# Patient Record
Sex: Male | Born: 1940
Health system: Southern US, Community
[De-identification: ages and names within clinical notes are randomized; demographics above are authoritative.]

## PROBLEM LIST (undated history)

## (undated) DIAGNOSIS — E785 Hyperlipidemia, unspecified: Secondary | ICD-10-CM

## (undated) DIAGNOSIS — Z5189 Encounter for other specified aftercare: Secondary | ICD-10-CM

## (undated) DIAGNOSIS — S381XXA Crushing injury of abdomen, lower back, and pelvis, initial encounter: Secondary | ICD-10-CM

## (undated) DIAGNOSIS — M1990 Unspecified osteoarthritis, unspecified site: Secondary | ICD-10-CM

## (undated) DIAGNOSIS — E119 Type 2 diabetes mellitus without complications: Secondary | ICD-10-CM

## (undated) DIAGNOSIS — H269 Unspecified cataract: Secondary | ICD-10-CM

## (undated) DIAGNOSIS — I10 Essential (primary) hypertension: Secondary | ICD-10-CM

## (undated) HISTORY — PX: TONSILLECTOMY: SUR1361

## (undated) HISTORY — PX: SUPRAPUBIC CATHETER INSERTION: SUR719

## (undated) HISTORY — DX: Encounter for other specified aftercare: Z51.89

## (undated) HISTORY — PX: ULNAR NERVE TRANSPOSITION: SHX2595

## (undated) HISTORY — PX: POLYPECTOMY: SHX149

## (undated) HISTORY — DX: Unspecified cataract: H26.9

## (undated) HISTORY — DX: Unspecified osteoarthritis, unspecified site: M19.90

## (undated) HISTORY — PX: COLONOSCOPY: SHX174

## (undated) HISTORY — PX: ACHILLES TENDON SURGERY: SHX542

## (undated) HISTORY — DX: Crushing injury of abdomen, lower back, and pelvis, initial encounter: S38.1XXA

---

## 2013-06-24 DIAGNOSIS — K635 Polyp of colon: Secondary | ICD-10-CM | POA: Insufficient documentation

## 2014-05-16 DIAGNOSIS — E291 Testicular hypofunction: Secondary | ICD-10-CM | POA: Diagnosis not present

## 2014-05-16 DIAGNOSIS — N529 Male erectile dysfunction, unspecified: Secondary | ICD-10-CM | POA: Diagnosis not present

## 2014-06-01 DIAGNOSIS — Z0000 Encounter for general adult medical examination without abnormal findings: Secondary | ICD-10-CM | POA: Diagnosis not present

## 2014-06-01 DIAGNOSIS — H2513 Age-related nuclear cataract, bilateral: Secondary | ICD-10-CM | POA: Diagnosis not present

## 2014-06-01 DIAGNOSIS — E119 Type 2 diabetes mellitus without complications: Secondary | ICD-10-CM | POA: Diagnosis not present

## 2014-06-13 DIAGNOSIS — E1165 Type 2 diabetes mellitus with hyperglycemia: Secondary | ICD-10-CM | POA: Diagnosis not present

## 2014-06-13 DIAGNOSIS — I10 Essential (primary) hypertension: Secondary | ICD-10-CM | POA: Diagnosis not present

## 2014-06-13 DIAGNOSIS — J309 Allergic rhinitis, unspecified: Secondary | ICD-10-CM | POA: Diagnosis not present

## 2014-06-13 DIAGNOSIS — E118 Type 2 diabetes mellitus with unspecified complications: Secondary | ICD-10-CM | POA: Diagnosis not present

## 2014-06-14 DIAGNOSIS — M5136 Other intervertebral disc degeneration, lumbar region: Secondary | ICD-10-CM | POA: Diagnosis not present

## 2014-06-14 DIAGNOSIS — M545 Low back pain: Secondary | ICD-10-CM | POA: Diagnosis not present

## 2014-06-14 DIAGNOSIS — M4806 Spinal stenosis, lumbar region: Secondary | ICD-10-CM | POA: Diagnosis not present

## 2014-06-14 DIAGNOSIS — M47816 Spondylosis without myelopathy or radiculopathy, lumbar region: Secondary | ICD-10-CM | POA: Diagnosis not present

## 2014-06-27 DIAGNOSIS — Z6829 Body mass index (BMI) 29.0-29.9, adult: Secondary | ICD-10-CM | POA: Diagnosis not present

## 2014-06-27 DIAGNOSIS — Z0000 Encounter for general adult medical examination without abnormal findings: Secondary | ICD-10-CM | POA: Diagnosis not present

## 2014-07-19 DIAGNOSIS — E780 Pure hypercholesterolemia: Secondary | ICD-10-CM | POA: Diagnosis not present

## 2014-07-19 DIAGNOSIS — Z6829 Body mass index (BMI) 29.0-29.9, adult: Secondary | ICD-10-CM | POA: Diagnosis not present

## 2014-07-19 DIAGNOSIS — I10 Essential (primary) hypertension: Secondary | ICD-10-CM | POA: Diagnosis not present

## 2014-07-19 DIAGNOSIS — E1165 Type 2 diabetes mellitus with hyperglycemia: Secondary | ICD-10-CM | POA: Diagnosis not present

## 2014-09-14 DIAGNOSIS — M5136 Other intervertebral disc degeneration, lumbar region: Secondary | ICD-10-CM | POA: Diagnosis not present

## 2014-09-14 DIAGNOSIS — M545 Low back pain: Secondary | ICD-10-CM | POA: Diagnosis not present

## 2014-09-14 DIAGNOSIS — M4806 Spinal stenosis, lumbar region: Secondary | ICD-10-CM | POA: Diagnosis not present

## 2014-09-24 DIAGNOSIS — E780 Pure hypercholesterolemia: Secondary | ICD-10-CM | POA: Diagnosis not present

## 2014-09-24 DIAGNOSIS — I10 Essential (primary) hypertension: Secondary | ICD-10-CM | POA: Diagnosis not present

## 2014-09-24 DIAGNOSIS — E1165 Type 2 diabetes mellitus with hyperglycemia: Secondary | ICD-10-CM | POA: Diagnosis not present

## 2014-09-24 DIAGNOSIS — Z6829 Body mass index (BMI) 29.0-29.9, adult: Secondary | ICD-10-CM | POA: Diagnosis not present

## 2014-11-20 DIAGNOSIS — M4316 Spondylolisthesis, lumbar region: Secondary | ICD-10-CM | POA: Diagnosis not present

## 2014-11-20 DIAGNOSIS — M5126 Other intervertebral disc displacement, lumbar region: Secondary | ICD-10-CM | POA: Diagnosis not present

## 2014-11-20 DIAGNOSIS — M47816 Spondylosis without myelopathy or radiculopathy, lumbar region: Secondary | ICD-10-CM | POA: Diagnosis not present

## 2014-11-20 DIAGNOSIS — M5136 Other intervertebral disc degeneration, lumbar region: Secondary | ICD-10-CM | POA: Diagnosis not present

## 2014-11-20 DIAGNOSIS — M545 Low back pain: Secondary | ICD-10-CM | POA: Diagnosis not present

## 2014-11-20 DIAGNOSIS — M4806 Spinal stenosis, lumbar region: Secondary | ICD-10-CM | POA: Diagnosis not present

## 2014-11-28 DIAGNOSIS — K760 Fatty (change of) liver, not elsewhere classified: Secondary | ICD-10-CM | POA: Diagnosis not present

## 2014-11-28 DIAGNOSIS — N132 Hydronephrosis with renal and ureteral calculous obstruction: Secondary | ICD-10-CM | POA: Diagnosis not present

## 2014-11-28 DIAGNOSIS — I10 Essential (primary) hypertension: Secondary | ICD-10-CM | POA: Diagnosis not present

## 2014-11-28 DIAGNOSIS — N202 Calculus of kidney with calculus of ureter: Secondary | ICD-10-CM | POA: Diagnosis not present

## 2014-11-28 DIAGNOSIS — N1330 Unspecified hydronephrosis: Secondary | ICD-10-CM | POA: Diagnosis not present

## 2014-11-28 DIAGNOSIS — M5127 Other intervertebral disc displacement, lumbosacral region: Secondary | ICD-10-CM | POA: Diagnosis not present

## 2014-11-28 DIAGNOSIS — E119 Type 2 diabetes mellitus without complications: Secondary | ICD-10-CM | POA: Diagnosis not present

## 2014-11-28 DIAGNOSIS — Z6829 Body mass index (BMI) 29.0-29.9, adult: Secondary | ICD-10-CM | POA: Diagnosis not present

## 2014-11-28 DIAGNOSIS — R350 Frequency of micturition: Secondary | ICD-10-CM | POA: Diagnosis not present

## 2014-12-03 DIAGNOSIS — E780 Pure hypercholesterolemia: Secondary | ICD-10-CM | POA: Diagnosis not present

## 2014-12-03 DIAGNOSIS — N200 Calculus of kidney: Secondary | ICD-10-CM | POA: Diagnosis not present

## 2014-12-03 DIAGNOSIS — E1165 Type 2 diabetes mellitus with hyperglycemia: Secondary | ICD-10-CM | POA: Diagnosis not present

## 2014-12-03 DIAGNOSIS — Z6830 Body mass index (BMI) 30.0-30.9, adult: Secondary | ICD-10-CM | POA: Diagnosis not present

## 2014-12-03 DIAGNOSIS — I10 Essential (primary) hypertension: Secondary | ICD-10-CM | POA: Diagnosis not present

## 2015-01-22 DIAGNOSIS — M4806 Spinal stenosis, lumbar region: Secondary | ICD-10-CM | POA: Diagnosis not present

## 2015-01-22 DIAGNOSIS — M545 Low back pain: Secondary | ICD-10-CM | POA: Diagnosis not present

## 2015-01-22 DIAGNOSIS — M47816 Spondylosis without myelopathy or radiculopathy, lumbar region: Secondary | ICD-10-CM | POA: Diagnosis not present

## 2015-02-05 DIAGNOSIS — N200 Calculus of kidney: Secondary | ICD-10-CM | POA: Diagnosis not present

## 2015-02-05 DIAGNOSIS — I10 Essential (primary) hypertension: Secondary | ICD-10-CM | POA: Diagnosis not present

## 2015-02-05 DIAGNOSIS — Z794 Long term (current) use of insulin: Secondary | ICD-10-CM | POA: Diagnosis not present

## 2015-02-05 DIAGNOSIS — Z23 Encounter for immunization: Secondary | ICD-10-CM | POA: Diagnosis not present

## 2015-02-05 DIAGNOSIS — E1165 Type 2 diabetes mellitus with hyperglycemia: Secondary | ICD-10-CM | POA: Diagnosis not present

## 2015-02-05 DIAGNOSIS — E7800 Pure hypercholesterolemia, unspecified: Secondary | ICD-10-CM | POA: Diagnosis not present

## 2015-02-13 DIAGNOSIS — M25519 Pain in unspecified shoulder: Secondary | ICD-10-CM | POA: Diagnosis not present

## 2015-02-13 DIAGNOSIS — M25511 Pain in right shoulder: Secondary | ICD-10-CM | POA: Diagnosis not present

## 2015-03-13 DIAGNOSIS — M5386 Other specified dorsopathies, lumbar region: Secondary | ICD-10-CM | POA: Diagnosis not present

## 2015-03-13 DIAGNOSIS — M5136 Other intervertebral disc degeneration, lumbar region: Secondary | ICD-10-CM | POA: Diagnosis not present

## 2015-03-13 DIAGNOSIS — E1141 Type 2 diabetes mellitus with diabetic mononeuropathy: Secondary | ICD-10-CM | POA: Diagnosis not present

## 2015-03-13 DIAGNOSIS — M545 Low back pain: Secondary | ICD-10-CM | POA: Diagnosis not present

## 2015-03-13 DIAGNOSIS — M5126 Other intervertebral disc displacement, lumbar region: Secondary | ICD-10-CM | POA: Diagnosis not present

## 2015-03-13 DIAGNOSIS — M542 Cervicalgia: Secondary | ICD-10-CM | POA: Diagnosis not present

## 2015-03-13 DIAGNOSIS — M25519 Pain in unspecified shoulder: Secondary | ICD-10-CM | POA: Diagnosis not present

## 2015-03-13 DIAGNOSIS — M5416 Radiculopathy, lumbar region: Secondary | ICD-10-CM | POA: Diagnosis not present

## 2015-03-13 DIAGNOSIS — M19011 Primary osteoarthritis, right shoulder: Secondary | ICD-10-CM | POA: Diagnosis not present

## 2015-04-17 DIAGNOSIS — N200 Calculus of kidney: Secondary | ICD-10-CM | POA: Diagnosis not present

## 2015-04-17 DIAGNOSIS — Z0000 Encounter for general adult medical examination without abnormal findings: Secondary | ICD-10-CM | POA: Diagnosis not present

## 2015-04-17 DIAGNOSIS — R1031 Right lower quadrant pain: Secondary | ICD-10-CM | POA: Diagnosis not present

## 2015-04-17 DIAGNOSIS — N201 Calculus of ureter: Secondary | ICD-10-CM | POA: Diagnosis not present

## 2015-04-17 DIAGNOSIS — N289 Disorder of kidney and ureter, unspecified: Secondary | ICD-10-CM | POA: Diagnosis not present

## 2015-04-17 DIAGNOSIS — R310 Gross hematuria: Secondary | ICD-10-CM | POA: Diagnosis not present

## 2015-04-24 DIAGNOSIS — N201 Calculus of ureter: Secondary | ICD-10-CM | POA: Diagnosis not present

## 2015-04-24 DIAGNOSIS — R310 Gross hematuria: Secondary | ICD-10-CM | POA: Diagnosis not present

## 2015-04-24 DIAGNOSIS — Z0000 Encounter for general adult medical examination without abnormal findings: Secondary | ICD-10-CM | POA: Diagnosis not present

## 2015-04-26 ENCOUNTER — Other Ambulatory Visit: Payer: Self-pay | Admitting: Urology

## 2015-05-01 DIAGNOSIS — N200 Calculus of kidney: Secondary | ICD-10-CM | POA: Diagnosis not present

## 2015-05-01 DIAGNOSIS — Z6828 Body mass index (BMI) 28.0-28.9, adult: Secondary | ICD-10-CM | POA: Diagnosis not present

## 2015-05-01 DIAGNOSIS — E119 Type 2 diabetes mellitus without complications: Secondary | ICD-10-CM | POA: Diagnosis not present

## 2015-05-01 DIAGNOSIS — Z794 Long term (current) use of insulin: Secondary | ICD-10-CM | POA: Diagnosis not present

## 2015-05-01 DIAGNOSIS — E875 Hyperkalemia: Secondary | ICD-10-CM | POA: Diagnosis not present

## 2015-05-01 DIAGNOSIS — R319 Hematuria, unspecified: Secondary | ICD-10-CM | POA: Diagnosis not present

## 2015-05-01 DIAGNOSIS — R000 Tachycardia, unspecified: Secondary | ICD-10-CM | POA: Diagnosis not present

## 2015-05-01 DIAGNOSIS — E1165 Type 2 diabetes mellitus with hyperglycemia: Secondary | ICD-10-CM | POA: Diagnosis not present

## 2015-05-02 ENCOUNTER — Encounter (HOSPITAL_COMMUNITY): Admission: RE | Payer: Self-pay | Source: Ambulatory Visit

## 2015-05-02 ENCOUNTER — Ambulatory Visit (HOSPITAL_COMMUNITY): Admission: RE | Admit: 2015-05-02 | Payer: Self-pay | Source: Ambulatory Visit | Admitting: Urology

## 2015-05-02 SURGERY — LITHOTRIPSY, ESWL
Anesthesia: LOCAL | Laterality: Right

## 2015-05-15 DIAGNOSIS — Z794 Long term (current) use of insulin: Secondary | ICD-10-CM | POA: Diagnosis not present

## 2015-05-15 DIAGNOSIS — E1165 Type 2 diabetes mellitus with hyperglycemia: Secondary | ICD-10-CM | POA: Diagnosis not present

## 2015-06-04 DIAGNOSIS — H2513 Age-related nuclear cataract, bilateral: Secondary | ICD-10-CM | POA: Diagnosis not present

## 2015-06-04 DIAGNOSIS — E119 Type 2 diabetes mellitus without complications: Secondary | ICD-10-CM | POA: Diagnosis not present

## 2015-06-10 DIAGNOSIS — E1169 Type 2 diabetes mellitus with other specified complication: Secondary | ICD-10-CM | POA: Diagnosis not present

## 2015-06-10 DIAGNOSIS — Z87442 Personal history of urinary calculi: Secondary | ICD-10-CM | POA: Diagnosis not present

## 2015-06-10 DIAGNOSIS — Z794 Long term (current) use of insulin: Secondary | ICD-10-CM | POA: Diagnosis not present

## 2015-06-10 DIAGNOSIS — Z0000 Encounter for general adult medical examination without abnormal findings: Secondary | ICD-10-CM | POA: Diagnosis not present

## 2015-06-10 DIAGNOSIS — E1165 Type 2 diabetes mellitus with hyperglycemia: Secondary | ICD-10-CM | POA: Diagnosis not present

## 2015-06-10 DIAGNOSIS — Z6830 Body mass index (BMI) 30.0-30.9, adult: Secondary | ICD-10-CM | POA: Diagnosis not present

## 2015-06-11 DIAGNOSIS — N201 Calculus of ureter: Secondary | ICD-10-CM | POA: Diagnosis not present

## 2015-06-18 DIAGNOSIS — E1165 Type 2 diabetes mellitus with hyperglycemia: Secondary | ICD-10-CM | POA: Diagnosis not present

## 2015-07-16 DIAGNOSIS — E1165 Type 2 diabetes mellitus with hyperglycemia: Secondary | ICD-10-CM | POA: Diagnosis not present

## 2015-07-30 DIAGNOSIS — N202 Calculus of kidney with calculus of ureter: Secondary | ICD-10-CM | POA: Diagnosis not present

## 2015-07-30 DIAGNOSIS — Z0000 Encounter for general adult medical examination without abnormal findings: Secondary | ICD-10-CM | POA: Diagnosis not present

## 2015-08-13 DIAGNOSIS — J0101 Acute recurrent maxillary sinusitis: Secondary | ICD-10-CM | POA: Diagnosis not present

## 2015-08-13 DIAGNOSIS — Z6829 Body mass index (BMI) 29.0-29.9, adult: Secondary | ICD-10-CM | POA: Diagnosis not present

## 2015-08-13 DIAGNOSIS — E1169 Type 2 diabetes mellitus with other specified complication: Secondary | ICD-10-CM | POA: Diagnosis not present

## 2015-08-13 DIAGNOSIS — I10 Essential (primary) hypertension: Secondary | ICD-10-CM | POA: Diagnosis not present

## 2015-08-13 DIAGNOSIS — E7800 Pure hypercholesterolemia, unspecified: Secondary | ICD-10-CM | POA: Diagnosis not present

## 2015-11-26 DIAGNOSIS — E7800 Pure hypercholesterolemia, unspecified: Secondary | ICD-10-CM | POA: Diagnosis not present

## 2015-11-26 DIAGNOSIS — E118 Type 2 diabetes mellitus with unspecified complications: Secondary | ICD-10-CM | POA: Diagnosis not present

## 2015-11-26 DIAGNOSIS — I10 Essential (primary) hypertension: Secondary | ICD-10-CM | POA: Diagnosis not present

## 2015-11-26 DIAGNOSIS — N200 Calculus of kidney: Secondary | ICD-10-CM | POA: Diagnosis not present

## 2015-11-26 DIAGNOSIS — Z794 Long term (current) use of insulin: Secondary | ICD-10-CM | POA: Diagnosis not present

## 2015-11-26 DIAGNOSIS — R319 Hematuria, unspecified: Secondary | ICD-10-CM | POA: Diagnosis not present

## 2015-11-26 DIAGNOSIS — Z6829 Body mass index (BMI) 29.0-29.9, adult: Secondary | ICD-10-CM | POA: Diagnosis not present

## 2015-12-05 DIAGNOSIS — Z794 Long term (current) use of insulin: Secondary | ICD-10-CM | POA: Diagnosis not present

## 2015-12-05 DIAGNOSIS — E1165 Type 2 diabetes mellitus with hyperglycemia: Secondary | ICD-10-CM | POA: Diagnosis not present

## 2015-12-18 DIAGNOSIS — R1032 Left lower quadrant pain: Secondary | ICD-10-CM | POA: Diagnosis not present

## 2015-12-18 DIAGNOSIS — N201 Calculus of ureter: Secondary | ICD-10-CM | POA: Diagnosis not present

## 2015-12-18 DIAGNOSIS — R310 Gross hematuria: Secondary | ICD-10-CM | POA: Diagnosis not present

## 2015-12-18 DIAGNOSIS — N132 Hydronephrosis with renal and ureteral calculous obstruction: Secondary | ICD-10-CM | POA: Diagnosis not present

## 2015-12-26 DIAGNOSIS — N201 Calculus of ureter: Secondary | ICD-10-CM | POA: Diagnosis not present

## 2016-01-10 ENCOUNTER — Encounter (HOSPITAL_COMMUNITY): Payer: Self-pay

## 2016-01-10 ENCOUNTER — Inpatient Hospital Stay (HOSPITAL_COMMUNITY): Payer: Medicare Other

## 2016-01-10 ENCOUNTER — Emergency Department (HOSPITAL_COMMUNITY): Payer: Medicare Other

## 2016-01-10 ENCOUNTER — Inpatient Hospital Stay (HOSPITAL_COMMUNITY)
Admission: EM | Admit: 2016-01-10 | Discharge: 2016-01-17 | DRG: 958 | Disposition: A | Discharge status: Rehabilitation Facility | Payer: Medicare Other | Attending: General Surgery | Admitting: General Surgery

## 2016-01-10 DIAGNOSIS — S32110A Nondisplaced Zone I fracture of sacrum, initial encounter for closed fracture: Secondary | ICD-10-CM | POA: Diagnosis present

## 2016-01-10 DIAGNOSIS — N3289 Other specified disorders of bladder: Secondary | ICD-10-CM | POA: Diagnosis not present

## 2016-01-10 DIAGNOSIS — IMO0002 Reserved for concepts with insufficient information to code with codable children: Secondary | ICD-10-CM

## 2016-01-10 DIAGNOSIS — S329XXD Fracture of unspecified parts of lumbosacral spine and pelvis, subsequent encounter for fracture with routine healing: Secondary | ICD-10-CM | POA: Diagnosis present

## 2016-01-10 DIAGNOSIS — E119 Type 2 diabetes mellitus without complications: Secondary | ICD-10-CM | POA: Diagnosis not present

## 2016-01-10 DIAGNOSIS — M6282 Rhabdomyolysis: Secondary | ICD-10-CM | POA: Diagnosis present

## 2016-01-10 DIAGNOSIS — L89152 Pressure ulcer of sacral region, stage 2: Secondary | ICD-10-CM | POA: Diagnosis not present

## 2016-01-10 DIAGNOSIS — M5126 Other intervertebral disc displacement, lumbar region: Secondary | ICD-10-CM | POA: Diagnosis present

## 2016-01-10 DIAGNOSIS — N368 Other specified disorders of urethra: Secondary | ICD-10-CM | POA: Diagnosis not present

## 2016-01-10 DIAGNOSIS — I959 Hypotension, unspecified: Secondary | ICD-10-CM | POA: Diagnosis not present

## 2016-01-10 DIAGNOSIS — W3089XA Contact with other specified agricultural machinery, initial encounter: Secondary | ICD-10-CM | POA: Diagnosis not present

## 2016-01-10 DIAGNOSIS — S32391A Other fracture of right ilium, initial encounter for closed fracture: Secondary | ICD-10-CM | POA: Diagnosis not present

## 2016-01-10 DIAGNOSIS — K5903 Drug induced constipation: Secondary | ICD-10-CM | POA: Diagnosis not present

## 2016-01-10 DIAGNOSIS — L89159 Pressure ulcer of sacral region, unspecified stage: Secondary | ICD-10-CM | POA: Diagnosis not present

## 2016-01-10 DIAGNOSIS — W309XXS Contact with unspecified agricultural machinery, sequela: Secondary | ICD-10-CM | POA: Diagnosis not present

## 2016-01-10 DIAGNOSIS — W309XXA Contact with unspecified agricultural machinery, initial encounter: Secondary | ICD-10-CM

## 2016-01-10 DIAGNOSIS — M25559 Pain in unspecified hip: Secondary | ICD-10-CM | POA: Diagnosis not present

## 2016-01-10 DIAGNOSIS — K5909 Other constipation: Secondary | ICD-10-CM | POA: Diagnosis present

## 2016-01-10 DIAGNOSIS — E785 Hyperlipidemia, unspecified: Secondary | ICD-10-CM | POA: Diagnosis present

## 2016-01-10 DIAGNOSIS — D62 Acute posthemorrhagic anemia: Secondary | ICD-10-CM | POA: Diagnosis not present

## 2016-01-10 DIAGNOSIS — S79911A Unspecified injury of right hip, initial encounter: Secondary | ICD-10-CM | POA: Diagnosis not present

## 2016-01-10 DIAGNOSIS — S32511A Fracture of superior rim of right pubis, initial encounter for closed fracture: Secondary | ICD-10-CM | POA: Diagnosis not present

## 2016-01-10 DIAGNOSIS — I10 Essential (primary) hypertension: Secondary | ICD-10-CM | POA: Diagnosis not present

## 2016-01-10 DIAGNOSIS — S299XXA Unspecified injury of thorax, initial encounter: Secondary | ICD-10-CM | POA: Diagnosis not present

## 2016-01-10 DIAGNOSIS — G8918 Other acute postprocedural pain: Secondary | ICD-10-CM | POA: Diagnosis not present

## 2016-01-10 DIAGNOSIS — E875 Hyperkalemia: Secondary | ICD-10-CM | POA: Diagnosis not present

## 2016-01-10 DIAGNOSIS — L89154 Pressure ulcer of sacral region, stage 4: Secondary | ICD-10-CM | POA: Diagnosis not present

## 2016-01-10 DIAGNOSIS — S3730XS Unspecified injury of urethra, sequela: Secondary | ICD-10-CM | POA: Diagnosis not present

## 2016-01-10 DIAGNOSIS — N179 Acute kidney failure, unspecified: Secondary | ICD-10-CM | POA: Diagnosis not present

## 2016-01-10 DIAGNOSIS — S3730XA Unspecified injury of urethra, initial encounter: Secondary | ICD-10-CM | POA: Diagnosis present

## 2016-01-10 DIAGNOSIS — S3733XA Laceration of urethra, initial encounter: Secondary | ICD-10-CM | POA: Diagnosis present

## 2016-01-10 DIAGNOSIS — S32810A Multiple fractures of pelvis with stable disruption of pelvic ring, initial encounter for closed fracture: Secondary | ICD-10-CM

## 2016-01-10 DIAGNOSIS — R110 Nausea: Secondary | ICD-10-CM | POA: Diagnosis not present

## 2016-01-10 DIAGNOSIS — S3739XD Other injury of urethra, subsequent encounter: Secondary | ICD-10-CM | POA: Diagnosis not present

## 2016-01-10 DIAGNOSIS — M160 Bilateral primary osteoarthritis of hip: Secondary | ICD-10-CM | POA: Diagnosis not present

## 2016-01-10 DIAGNOSIS — M25551 Pain in right hip: Secondary | ICD-10-CM

## 2016-01-10 DIAGNOSIS — S32810S Multiple fractures of pelvis with stable disruption of pelvic ring, sequela: Secondary | ICD-10-CM | POA: Diagnosis not present

## 2016-01-10 DIAGNOSIS — S3282XS Multiple fractures of pelvis without disruption of pelvic ring, sequela: Secondary | ICD-10-CM | POA: Diagnosis not present

## 2016-01-10 DIAGNOSIS — Z7984 Long term (current) use of oral hypoglycemic drugs: Secondary | ICD-10-CM | POA: Diagnosis not present

## 2016-01-10 DIAGNOSIS — S3993XA Unspecified injury of pelvis, initial encounter: Secondary | ICD-10-CM | POA: Diagnosis not present

## 2016-01-10 DIAGNOSIS — Z043 Encounter for examination and observation following other accident: Secondary | ICD-10-CM | POA: Diagnosis not present

## 2016-01-10 DIAGNOSIS — S32111A Minimally displaced Zone I fracture of sacrum, initial encounter for closed fracture: Secondary | ICD-10-CM | POA: Diagnosis not present

## 2016-01-10 DIAGNOSIS — M79609 Pain in unspecified limb: Secondary | ICD-10-CM | POA: Diagnosis not present

## 2016-01-10 DIAGNOSIS — T148XXA Other injury of unspecified body region, initial encounter: Secondary | ICD-10-CM

## 2016-01-10 DIAGNOSIS — S3282XA Multiple fractures of pelvis without disruption of pelvic ring, initial encounter for closed fracture: Secondary | ICD-10-CM | POA: Diagnosis present

## 2016-01-10 DIAGNOSIS — S381XXA Crushing injury of abdomen, lower back, and pelvis, initial encounter: Secondary | ICD-10-CM | POA: Diagnosis present

## 2016-01-10 DIAGNOSIS — S79811A Other specified injuries of right hip, initial encounter: Secondary | ICD-10-CM

## 2016-01-10 DIAGNOSIS — M533 Sacrococcygeal disorders, not elsewhere classified: Secondary | ICD-10-CM | POA: Diagnosis not present

## 2016-01-10 DIAGNOSIS — W3089XD Contact with other specified agricultural machinery, subsequent encounter: Secondary | ICD-10-CM | POA: Diagnosis not present

## 2016-01-10 DIAGNOSIS — S32811A Multiple fractures of pelvis with unstable disruption of pelvic ring, initial encounter for closed fracture: Secondary | ICD-10-CM | POA: Diagnosis present

## 2016-01-10 DIAGNOSIS — Z23 Encounter for immunization: Secondary | ICD-10-CM | POA: Diagnosis not present

## 2016-01-10 DIAGNOSIS — Z79899 Other long term (current) drug therapy: Secondary | ICD-10-CM | POA: Diagnosis not present

## 2016-01-10 DIAGNOSIS — N5089 Other specified disorders of the male genital organs: Secondary | ICD-10-CM | POA: Diagnosis not present

## 2016-01-10 DIAGNOSIS — R262 Difficulty in walking, not elsewhere classified: Secondary | ICD-10-CM

## 2016-01-10 DIAGNOSIS — S3991XA Unspecified injury of abdomen, initial encounter: Secondary | ICD-10-CM | POA: Diagnosis not present

## 2016-01-10 DIAGNOSIS — S329XXA Fracture of unspecified parts of lumbosacral spine and pelvis, initial encounter for closed fracture: Secondary | ICD-10-CM | POA: Diagnosis not present

## 2016-01-10 DIAGNOSIS — S3982XD Other specified injuries of lower back, subsequent encounter: Secondary | ICD-10-CM | POA: Diagnosis not present

## 2016-01-10 DIAGNOSIS — L89153 Pressure ulcer of sacral region, stage 3: Secondary | ICD-10-CM | POA: Diagnosis not present

## 2016-01-10 DIAGNOSIS — E1142 Type 2 diabetes mellitus with diabetic polyneuropathy: Secondary | ICD-10-CM | POA: Diagnosis not present

## 2016-01-10 HISTORY — DX: Essential (primary) hypertension: I10

## 2016-01-10 HISTORY — DX: Type 2 diabetes mellitus without complications: E11.9

## 2016-01-10 HISTORY — DX: Hyperlipidemia, unspecified: E78.5

## 2016-01-10 LAB — CBC WITH DIFFERENTIAL/PLATELET
Basophils Absolute: 0 K/uL (ref 0.0–0.1)
Basophils Relative: 0 %
Eosinophils Absolute: 0.2 K/uL (ref 0.0–0.7)
Eosinophils Relative: 1 %
HCT: 32.8 % — ABNORMAL LOW (ref 39.0–52.0)
Hemoglobin: 10.8 g/dL — ABNORMAL LOW (ref 13.0–17.0)
Lymphocytes Relative: 15 %
Lymphs Abs: 3.4 K/uL (ref 0.7–4.0)
MCH: 29.9 pg (ref 26.0–34.0)
MCHC: 32.9 g/dL (ref 30.0–36.0)
MCV: 90.9 fL (ref 78.0–100.0)
Monocytes Absolute: 1.4 K/uL — ABNORMAL HIGH (ref 0.1–1.0)
Monocytes Relative: 6 %
Neutro Abs: 17.6 K/uL — ABNORMAL HIGH (ref 1.7–7.7)
Neutrophils Relative %: 78 %
Platelets: 194 K/uL (ref 150–400)
RBC: 3.61 MIL/uL — ABNORMAL LOW (ref 4.22–5.81)
RDW: 13 % (ref 11.5–15.5)
WBC Morphology: INCREASED
WBC: 22.6 K/uL — ABNORMAL HIGH (ref 4.0–10.5)

## 2016-01-10 LAB — I-STAT CHEM 8, ED
BUN: 20 mg/dL (ref 6–20)
BUN: 21 mg/dL — ABNORMAL HIGH (ref 6–20)
CALCIUM ION: 1.07 mmol/L — AB (ref 1.15–1.40)
CHLORIDE: 108 mmol/L (ref 101–111)
CREATININE: 1.3 mg/dL — AB (ref 0.61–1.24)
Calcium, Ion: 1.02 mmol/L — ABNORMAL LOW (ref 1.15–1.40)
Chloride: 109 mmol/L (ref 101–111)
Creatinine, Ser: 1.2 mg/dL (ref 0.61–1.24)
Glucose, Bld: 325 mg/dL — ABNORMAL HIGH (ref 65–99)
Glucose, Bld: 373 mg/dL — ABNORMAL HIGH (ref 65–99)
HCT: 26 % — ABNORMAL LOW (ref 39.0–52.0)
HEMATOCRIT: 31 % — AB (ref 39.0–52.0)
HEMOGLOBIN: 8.8 g/dL — AB (ref 13.0–17.0)
Hemoglobin: 10.5 g/dL — ABNORMAL LOW (ref 13.0–17.0)
POTASSIUM: 4.9 mmol/L (ref 3.5–5.1)
Potassium: 4.3 mmol/L (ref 3.5–5.1)
Sodium: 138 mmol/L (ref 135–145)
Sodium: 140 mmol/L (ref 135–145)
TCO2: 17 mmol/L (ref 0–100)
TCO2: 17 mmol/L (ref 0–100)

## 2016-01-10 LAB — BASIC METABOLIC PANEL
Anion gap: 13 (ref 5–15)
BUN: 19 mg/dL (ref 6–20)
CO2: 16 mmol/L — AB (ref 22–32)
CREATININE: 1.44 mg/dL — AB (ref 0.61–1.24)
Calcium: 8.2 mg/dL — ABNORMAL LOW (ref 8.9–10.3)
Chloride: 106 mmol/L (ref 101–111)
GFR calc non Af Amer: 46 mL/min — ABNORMAL LOW (ref >60)
GFR, EST AFRICAN AMERICAN: 54 mL/min — AB (ref >60)
Glucose, Bld: 328 mg/dL — ABNORMAL HIGH (ref 65–99)
Potassium: 4.2 mmol/L (ref 3.5–5.1)
Sodium: 135 mmol/L (ref 135–145)

## 2016-01-10 LAB — PREPARE FRESH FROZEN PLASMA
Unit division: 0
Unit division: 0

## 2016-01-10 LAB — I-STAT CG4 LACTIC ACID, ED
LACTIC ACID, VENOUS: 6.45 mmol/L — AB (ref 0.5–1.9)
Lactic Acid, Venous: 5.8 mmol/L (ref 0.5–1.9)

## 2016-01-10 LAB — ABO/RH: ABO/RH(D): O POS

## 2016-01-10 MED ORDER — MORPHINE SULFATE (PF) 4 MG/ML IV SOLN
4.0000 mg | Freq: Once | INTRAVENOUS | Status: AC
  Administered 2016-01-10: 4 mg via INTRAVENOUS
  Filled 2016-01-10: qty 1

## 2016-01-10 MED ORDER — SODIUM CHLORIDE 0.9 % IV BOLUS (SEPSIS)
20.0000 mL/kg | Freq: Once | INTRAVENOUS | Status: AC
  Administered 2016-01-10: 2014 mL via INTRAVENOUS

## 2016-01-10 MED ORDER — MIDAZOLAM HCL 2 MG/2ML IJ SOLN
INTRAMUSCULAR | Status: AC | PRN
  Administered 2016-01-10: 1 mg via INTRAVENOUS

## 2016-01-10 MED ORDER — IOTHALAMATE MEGLUMINE 17.2 % UR SOLN
250.0000 mL | Freq: Once | URETHRAL | Status: AC | PRN
  Administered 2016-01-10: 250 mL via INTRAVESICAL

## 2016-01-10 MED ORDER — FENTANYL CITRATE (PF) 100 MCG/2ML IJ SOLN
INTRAMUSCULAR | Status: AC | PRN
  Administered 2016-01-10: 50 ug via INTRAVENOUS

## 2016-01-10 MED ORDER — LIDOCAINE HCL 1 % IJ SOLN
INTRAMUSCULAR | Status: AC
  Filled 2016-01-10: qty 20

## 2016-01-10 MED ORDER — FENTANYL CITRATE (PF) 100 MCG/2ML IJ SOLN
INTRAMUSCULAR | Status: AC
  Filled 2016-01-10: qty 4

## 2016-01-10 MED ORDER — IOPAMIDOL (ISOVUE-300) INJECTION 61%
INTRAVENOUS | Status: AC
  Administered 2016-01-10: 100 mL
  Filled 2016-01-10: qty 100

## 2016-01-10 MED ORDER — MIDAZOLAM HCL 2 MG/2ML IJ SOLN
INTRAMUSCULAR | Status: AC
  Filled 2016-01-10: qty 4
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
## 2016-01-10 NOTE — H&P (Signed)
History   Christian [name] is an 75 y.o. male.   Chief Complaint:  Chief Complaint  Patient presents with  . penile injury    bleeding    HPI This is a 75 yo male who was working on a tractor when it slipped into gear and pinned him between the tractor and a pole.  The patient had gross hematuria/ bleeding from tip of penis, abrasions of both hips, right hip tenderness.  He was brought in as a non-trauma code, but had some hypotension and was upgraded to a level 1 trauma code.  His BP responded to some volume.    Past Medical History:  Diagnosis Date  . Diabetes mellitus without complication ([place])   . Hyperlipidemia   . Hypertension     Past Surgical History:  Procedure Laterality Date  . ACHILLES TENDON SURGERY Right   . TONSILLECTOMY    . ULNAR NERVE TRANSPOSITION Right     History reviewed. No pertinent family history. Social History:  reports that he has never smoked. He has never used smokeless tobacco. He reports that he does not drink alcohol. His drug history is not on file.  Allergies  No Known Allergies  Home Medications   Prior to Admission medications   Medication Sig Start Date End Date Taking? Authorizing Provider  amLODipine (NORVASC) 5 MG tablet Take 7.5 mg by mouth daily. 01/10/16  Yes Historical Provider, MD  aspirin (GOODSENSE ASPIRIN) 325 MG tablet Take 325 mg by mouth daily.   Yes Historical Provider, MD  atorvastatin (LIPITOR) 80 MG tablet Take 80 mg by mouth daily. 12/11/15  Yes Historical Provider, MD  CINNAMON PO Take 1 capsule by mouth at bedtime.   Yes Historical Provider, MD  fluticasone (FLONASE) 50 MCG/ACT nasal spray Place 2 sprays into both nostrils daily.   Yes Historical Provider, MD  gabapentin (NEURONTIN) 300 MG capsule Take 300 mg by mouth 3 (three) times daily. 10/25/15  Yes Historical Provider, MD  glipiZIDE (GLUCOTROL) 5 MG tablet Take 5-10 mg by mouth See admin instructions. 10 mg in the morning and 5 mg in the evening 01/10/16  Yes  Historical Provider, MD  LEVEMIR FLEXTOUCH 100 UNIT/ML Pen Inject 5 Units into the skin at bedtime.  10/25/15  Yes Historical Provider, MD  losartan (COZAAR) 50 MG tablet Take 50 mg by mouth daily. 01/10/16  Yes Historical Provider, MD  metFORMIN (GLUCOPHAGE) 1000 MG tablet Take 1,000 mg by mouth 2 (two) times daily. 12/11/15  Yes Historical Provider, MD  traMADol (ULTRAM) 50 MG tablet Take 50 mg by mouth every 6 (six) hours as needed for pain. 12/18/15  Yes Historical Provider, MD    Trauma Course   Results for orders placed or performed during the hospital encounter of 01/10/16 (from the past 48 hour(s))  CBC with Differential     Status: Abnormal   Collection Time: 01/10/16  5:47 PM  Result Value Ref Range   WBC 22.6 (H) 4.0 - 10.5 K/uL   RBC 3.61 (L) 4.22 - 5.81 MIL/uL   Hemoglobin 10.8 (L) 13.0 - 17.0 g/dL   HCT 32.8 (L) 39.0 - 52.0 %   MCV 90.9 78.0 - 100.0 fL   MCH 29.9 26.0 - 34.0 pg   MCHC 32.9 30.0 - 36.0 g/dL   RDW 13.0 11.5 - 15.5 %   Platelets 194 150 - 400 K/uL   Neutrophils Relative % 78 %   Lymphocytes Relative 15 %   Monocytes Relative 6 %   Eosinophils  Relative 1 %   Basophils Relative 0 %   Neutro Abs 17.6 (H) 1.7 - 7.7 K/uL   Lymphs Abs 3.4 0.7 - 4.0 K/uL   Monocytes Absolute 1.4 (H) 0.1 - 1.0 K/uL   Eosinophils Absolute 0.2 0.0 - 0.7 K/uL   Basophils Absolute 0.0 0.0 - 0.1 K/uL   RBC Morphology ELLIPTOCYTES     Comment: BURR CELLS   WBC Morphology INCREASED BANDS (>20% BANDS)   Basic metabolic panel     Status: Abnormal   Collection Time: 01/10/16  5:47 PM  Result Value Ref Range   Sodium 135 135 - 145 mmol/L   Potassium 4.2 3.5 - 5.1 mmol/L   Chloride 106 101 - 111 mmol/L   CO2 16 (L) 22 - 32 mmol/L   Glucose, Bld 328 (H) 65 - 99 mg/dL   BUN 19 6 - 20 mg/dL   Creatinine, Ser 1.44 (H) 0.61 - 1.24 mg/dL   Calcium 8.2 (L) 8.9 - 10.3 mg/dL   GFR calc non Af Amer 46 (L) >60 mL/min   GFR calc Af Amer 54 (L) >60 mL/min    Comment: (NOTE) The eGFR has been  calculated using the CKD EPI equation. This calculation has not been validated in all clinical situations. eGFR's persistently <60 mL/min signify possible Chronic Kidney Disease.    Anion gap 13 5 - 15  Prepare fresh frozen plasma     Status: None   Collection Time: 01/10/16  6:09 PM  Result Value Ref Range   Unit Number [id number]    Blood Component Type LIQ PLASMA    Unit division 00    Status of Unit REL FROM [facility]    Unit tag comment VERBAL ORDERS PER DR MESNER    Transfusion Status OK TO TRANSFUSE    Unit Number [id number]    Blood Component Type LIQ PLASMA    Unit division 00    Status of Unit REL FROM [facility]    Unit tag comment VERBAL ORDERS PER DR MESNER    Transfusion Status OK TO TRANSFUSE   I-Stat Chem 8, ED     Status: Abnormal   Collection Time: 01/10/16  6:19 PM  Result Value Ref Range   Sodium 138 135 - 145 mmol/L   Potassium 4.3 3.5 - 5.1 mmol/L   Chloride 109 101 - 111 mmol/L   BUN 20 6 - 20 mg/dL   Creatinine, Ser 1.30 (H) 0.61 - 1.24 mg/dL   Glucose, Bld 325 (H) 65 - 99 mg/dL   Calcium, Ion 1.02 (L) 1.15 - 1.40 mmol/L   TCO2 17 0 - 100 mmol/L   Hemoglobin 10.5 (L) 13.0 - 17.0 g/dL   HCT 31.0 (L) 39.0 - 52.0 %  I-Stat CG4 Lactic Acid, ED     Status: Abnormal   Collection Time: 01/10/16  6:20 PM  Result Value Ref Range   Lactic Acid, Venous 5.80 (HH) 0.5 - 1.9 mmol/L   Comment NOTIFIED PHYSICIAN   Type and screen     Status: None   Collection Time: 01/10/16  6:22 PM  Result Value Ref Range   ABO/RH(D) O POS    Antibody Screen NEG    Sample Expiration 01/13/2016    Unit Number [id number]    Blood Component Type RED CELLS,LR    Unit division 00    Status of Unit REL FROM [facility]    Unit tag comment VERBAL ORDERS PER DR MESNER    Transfusion Status OK TO TRANSFUSE  Crossmatch Result COMPATIBLE    Unit Number [id number]    Blood Component Type RED CELLS,LR    Unit division 00    Status of Unit REL FROM [facility]    Unit tag comment VERBAL  ORDERS PER DR MESNER    Transfusion Status OK TO TRANSFUSE    Crossmatch Result COMPATIBLE   ABO/Rh     Status: None   Collection Time: 01/10/16  6:22 PM  Result Value Ref Range   ABO/RH(D) O POS   I-stat chem 8, ed     Status: Abnormal   Collection Time: 01/10/16  9:52 PM  Result Value Ref Range   Sodium 140 135 - 145 mmol/L   Potassium 4.9 3.5 - 5.1 mmol/L   Chloride 108 101 - 111 mmol/L   BUN 21 (H) 6 - 20 mg/dL   Creatinine, Ser 1.20 0.61 - 1.24 mg/dL   Glucose, Bld 373 (H) 65 - 99 mg/dL   Calcium, Ion 1.07 (L) 1.15 - 1.40 mmol/L   TCO2 17 0 - 100 mmol/L   Hemoglobin 8.8 (L) 13.0 - 17.0 g/dL   HCT 26.0 (L) 39.0 - 52.0 %  I-Stat CG4 Lactic Acid, ED     Status: Abnormal   Collection Time: 01/10/16  9:52 PM  Result Value Ref Range   Lactic Acid, Venous 6.45 (HH) 0.5 - 1.9 mmol/L   Comment NOTIFIED PHYSICIAN    Ct Chest W Contrast  Result Date: 01/10/2016 CLINICAL DATA:  Level 1 trauma, bleeding at the meatus of the penis. EXAM: CT CHEST WITH CONTRAST CT ABDOMEN AND PELVIS WITH AND WITHOUT CONTRAST TECHNIQUE: Multidetector CT imaging of the chest was performed during intravenous contrast administration. Multidetector CT imaging of the abdomen and pelvis was performed following the standard protocol before and during bolus administration of intravenous contrast. CONTRAST:  114m ISOVUE-300 IOPAMIDOL (ISOVUE-300) INJECTION 61% COMPARISON:  12/18/2015 CT abdomen and pelvis FINDINGS: CT CHEST FINDINGS Cardiovascular: Normal size cardiac chambers. No pericardial effusion. Minimal coronary arteriosclerosis. Mediastinum/Nodes: No mediastinal hematoma. Normal branching pattern of the great vessels. Slightly ectatic ascending thoracic aorta measuring up to 3.6 cm at the level of the main pulmonary artery. No dissection of the thoracic aorta. Lungs/Pleura: No effusion or pneumothorax. Bibasilar atelectasis. Pulmonary contusion nor consolidation. Musculoskeletal: Old right eighth rib fracture with  subtle deformity. No acute displaced fracture of the bony thorax. Osteoarthritis of the sternoclavicular joints. CT ABDOMEN AND PELVIS FINDINGS Hepatobiliary: Slightly hypodense appearance of the liver which may reflect fatty infiltration. No space-occupying mass. The left lobe is seen draped over the normal size spleen. Pancreas: Unremarkable. No pancreatic ductal dilatation or surrounding inflammatory changes. Spleen: No splenic injury or perisplenic hematoma. Small splenule measuring 7 mm Adrenals/Urinary Tract: Bilateral renal cysts are identified the largest is interpolar on the right measuring 1.6 cm and on the left, involving the lower pole measuring 1.9 cm. Bilateral punctate calcifications are identified the largest is on the left measuring 2 mm. The bladder is contracted in appearance. Overlying the bladder anteriorly in the space of Retzius and extending cephalad is heterogeneous isodense to hyperdense fluid in keeping with hemorrhage and clot. This measures approximately 12.9 cm craniocaudad by 3 cm AP x 12.4 cm transverse. Delayed images were repeated through the bladder demonstrating contrast opacified urine along an intact posterior bladder wall. The anterior wall where the adjacent hemorrhage however is seen cannot be definitely cleared for tear/rupture. Stomach/Bowel: Stomach is within normal limits. Appendix appears normal. No evidence of bowel wall thickening, distention, or inflammatory  changes. Vascular/Lymphatic: Aortic atherosclerosis. No enlarged abdominal or pelvic lymph nodes. Reproductive: Prostate is unremarkable. Other: Retroperitoneal hematoma with a asymmetric enlargement of the right psoas muscle suggesting an intramuscular component to hematoma. Overlying ill-defined free fluid is noted abutting the right psoas muscle. Soft tissue induration of the right gluteal soft tissues likely representing contusion. Musculoskeletal: There are acute fractures of the bony pelvis which are as  follows: 1. Sagittal right sacral alar fracture extending across the right sacroiliac joint and involving the right iliac bone. This appears to spare the sacral neural foramina. 2. Bilateral parasymphyseal fractures extending into the pubic symphysis posteriorly. 3.  Right inferior pubic ramus fracture with minimal displacement. There is lower lumbar degenerative disc disease at L4-5 and L5-S1. Multilevel facet hypertrophy and sclerosis most prominently at L5-S1 causing moderate to marked neural foraminal stenosis. No acute osseous abnormality of the dorsal spine. IMPRESSION: 1. Acute fractures of the right sacral ala extending across the right sacroiliac joint and involving the posterior right iliac bone. No significant displacement. 2. Acute bilateral parasymphyseal fractures with fracture lucencies extending into the pubic symphysis. 3. Acute minimally displaced right inferior pubic ramus fracture. 4. Hemorrhage and clot interposed between the pubic symphysis and bladder extending cephalad in the space of Retzius, measuring approximately 12.9 x 3 x 12.4 cm. No acute chest abnormalities. These results were called by telephone at the time of interpretation on 01/10/2016 at 7:20 pm to Dr. Gershon Crane , who verbally acknowledged these results. Electronically Signed   By: Ashley Royalty M.D.   On: 01/10/2016 19:35   Ct Abdomen Pelvis W Contrast  Result Date: 01/10/2016 CLINICAL DATA:  Level 1 trauma, bleeding at the meatus of the penis. EXAM: CT CHEST WITH CONTRAST CT ABDOMEN AND PELVIS WITH AND WITHOUT CONTRAST TECHNIQUE: Multidetector CT imaging of the chest was performed during intravenous contrast administration. Multidetector CT imaging of the abdomen and pelvis was performed following the standard protocol before and during bolus administration of intravenous contrast. CONTRAST:  170m ISOVUE-300 IOPAMIDOL (ISOVUE-300) INJECTION 61% COMPARISON:  12/18/2015 CT abdomen and pelvis FINDINGS: CT CHEST FINDINGS  Cardiovascular: Normal size cardiac chambers. No pericardial effusion. Minimal coronary arteriosclerosis. Mediastinum/Nodes: No mediastinal hematoma. Normal branching pattern of the great vessels. Slightly ectatic ascending thoracic aorta measuring up to 3.6 cm at the level of the main pulmonary artery. No dissection of the thoracic aorta. Lungs/Pleura: No effusion or pneumothorax. Bibasilar atelectasis. Pulmonary contusion nor consolidation. Musculoskeletal: Old right eighth rib fracture with subtle deformity. No acute displaced fracture of the bony thorax. Osteoarthritis of the sternoclavicular joints. CT ABDOMEN AND PELVIS FINDINGS Hepatobiliary: Slightly hypodense appearance of the liver which may reflect fatty infiltration. No space-occupying mass. The left lobe is seen draped over the normal size spleen. Pancreas: Unremarkable. No pancreatic ductal dilatation or surrounding inflammatory changes. Spleen: No splenic injury or perisplenic hematoma. Small splenule measuring 7 mm Adrenals/Urinary Tract: Bilateral renal cysts are identified the largest is interpolar on the right measuring 1.6 cm and on the left, involving the lower pole measuring 1.9 cm. Bilateral punctate calcifications are identified the largest is on the left measuring 2 mm. The bladder is contracted in appearance. Overlying the bladder anteriorly in the space of Retzius and extending cephalad is heterogeneous isodense to hyperdense fluid in keeping with hemorrhage and clot. This measures approximately 12.9 cm craniocaudad by 3 cm AP x 12.4 cm transverse. Delayed images were repeated through the bladder demonstrating contrast opacified urine along an intact posterior bladder wall. The anterior wall where the adjacent  hemorrhage however is seen cannot be definitely cleared for tear/rupture. Stomach/Bowel: Stomach is within normal limits. Appendix appears normal. No evidence of bowel wall thickening, distention, or inflammatory changes.  Vascular/Lymphatic: Aortic atherosclerosis. No enlarged abdominal or pelvic lymph nodes. Reproductive: Prostate is unremarkable. Other: Retroperitoneal hematoma with a asymmetric enlargement of the right psoas muscle suggesting an intramuscular component to hematoma. Overlying ill-defined free fluid is noted abutting the right psoas muscle. Soft tissue induration of the right gluteal soft tissues likely representing contusion. Musculoskeletal: There are acute fractures of the bony pelvis which are as follows: 1. Sagittal right sacral alar fracture extending across the right sacroiliac joint and involving the right iliac bone. This appears to spare the sacral neural foramina. 2. Bilateral parasymphyseal fractures extending into the pubic symphysis posteriorly. 3.  Right inferior pubic ramus fracture with minimal displacement. There is lower lumbar degenerative disc disease at L4-5 and L5-S1. Multilevel facet hypertrophy and sclerosis most prominently at L5-S1 causing moderate to marked neural foraminal stenosis. No acute osseous abnormality of the dorsal spine. IMPRESSION: 1. Acute fractures of the right sacral ala extending across the right sacroiliac joint and involving the posterior right iliac bone. No significant displacement. 2. Acute bilateral parasymphyseal fractures with fracture lucencies extending into the pubic symphysis. 3. Acute minimally displaced right inferior pubic ramus fracture. 4. Hemorrhage and clot interposed between the pubic symphysis and bladder extending cephalad in the space of Retzius, measuring approximately 12.9 x 3 x 12.4 cm. No acute chest abnormalities. These results were called by telephone at the time of interpretation on 01/10/2016 at 7:20 pm to Dr. Gershon Crane , who verbally acknowledged these results. Electronically Signed   By: Ashley Royalty M.D.   On: 01/10/2016 19:35   Dg Retrograde-urethrogram  Result Date: 01/10/2016 CLINICAL DATA:  Tractor accident.  Pelvic fractures. EXAM:  RETROGRADE URETHROGRAPHY TECHNIQUE: Following catheterization of the urethral meatus water-soluble contrast material was injected in a retrograde fashion into the urethra. CONTRAST:  30 cc Conrad Cysto FLUOROSCOPY TIME:  Radiation Exposure Index (as provided by the fluoroscopic device): If the device does not provide the exposure index: Fluoroscopy Time (in minutes and seconds):  1 minutes 20 seconds Number of Acquired Images:  13 COMPARISON:  CT 01/10/2016 FINDINGS: At the outset of the examination there was already contrast material within the urinary bladder from the CT of the abdomen and pelvis performed earlier. Retrograde injection of the Foley catheter resultant in Opacification of the distal and mid urethra to the level of the bulbous urethra. No passage of contrast material beyond the level of the bulbous urethra identified. Venous intravasation was visualized at the level of the bulbous urethra indicating adequate injection pressures. No extravasation of contrast material from the urethra identified . IMPRESSION: Imaging findings compatible with an obstructed urethrogram at the junction between the bulbous urethra and membranous urethra. No extravasation of contrast material from the urethra identified. These results were called by telephone at the time of interpretation on 01/10/2016 at 8:48 pm to Dr. Dayna Barker , who verbally acknowledged these results. Electronically Signed   By: Kerby Moors M.D.   On: 01/10/2016 20:51    Review of Systems  Constitutional: Negative for weight loss.  HENT: Negative for ear discharge, ear pain, hearing loss and tinnitus.   Eyes: Negative for blurred vision, double vision, photophobia and pain.  Respiratory: Negative for cough, sputum production and shortness of breath.   Cardiovascular: Negative for chest pain.  Gastrointestinal: Positive for abdominal pain (suprapubic). Negative for nausea and vomiting.  Genitourinary: Positive for hematuria. Negative for  dysuria, flank pain, frequency and urgency.  Musculoskeletal: Positive for joint pain (Right hip). Negative for back pain, falls, myalgias and neck pain.  Neurological: Negative for dizziness, tingling, sensory change, focal weakness, loss of consciousness and headaches.  Endo/Heme/Allergies: Does not bruise/bleed easily.  Psychiatric/Behavioral: Negative for depression, memory loss and substance abuse. The patient is not nervous/anxious.     Blood pressure (!) 96/50, pulse 103, resp. rate 23, height '6\' 2"'  (1.88 m), weight 100.7 kg (222 lb), SpO2 94 %. Physical Exam  Constitutional: He is oriented to person, place, and time. He appears well-developed and well-nourished.  HENT:  Head: Normocephalic and atraumatic.  Right Ear: External ear normal.  Left Ear: External ear normal.  Hard of hearing   Eyes: EOM are normal. Pupils are equal, round, and reactive to light.  Neck: Normal range of motion. Neck supple.  Cardiovascular: Normal rate and regular rhythm.   Respiratory: Effort normal and breath sounds normal. No respiratory distress.  GI: Soft. Bowel sounds are normal. There is tenderness (suprapubic region - some swelling). There is no rebound and no guarding.  Abrasions bilateral lower abdomen  Genitourinary: Penile tenderness present.  Genitourinary Comments: Gross blood at urethral meatus  Musculoskeletal:  Limited ROM right hip secondary to pain   Neurological: He is alert and oriented to person, place, and time.  Skin: Skin is warm and dry.  Psychiatric: He has a normal mood and affect. His behavior is normal. Judgment and thought content normal.     Assessment/Plan 1.  Pelvic crush injury 2.  Right sacral ala fracture extending across right SI join to right iliac bone 3.  Bilateral parasymphyseal fxs extending into pubic symphysis 4.  Right inferior pubic ramus fracture 5.  Hematoma in space of Retzius 6.  Probable urethral injury/ obstruction  Consultants:  [facility] [place]  To Stepdown unit Urology to discuss suprapubic tube placement with IR.   Touchdown weight bearing right leg   Ysabelle Goodroe K. 01/10/2016, 10:19 PM   Procedures

## 2016-01-10 NOTE — ED Triage Notes (Signed)
Pt. Coming from home via [facility] for penile injury. Pt. Starting tractor when it went into gear and pinned him between a pole and the tractor. Pt. Noted to have bleeding from foreskin when EMS arrived. Bleeding worse with movement. Pt. Also noted to have abrasions bilateral hips with some right hip tenderness. Pt. Noted to be diaphoretic at this time with BP dropped to 92/57. Fluid bolus initiated. Pt. Denies LOC.

## 2016-01-10 NOTE — Progress Notes (Signed)
   01/10/16 1805  Clinical Encounter Type  Visited With Family  Visit Type ED  Referral From Other (Comment) (Page)  Spiritual Encounters  Spiritual Needs Prayer;Emotional  Stress Factors  Patient Stress Factors Not reviewed  Family Stress Factors Family relationships  Already in ED and nursing asked chaplain to visit family as Pt moved to trauma c. Offered prayer and emotional support.

## 2016-01-10 NOTE — Sedation Documentation (Signed)
Patient denies pain and is resting comfortably.  

## 2016-01-10 NOTE — ED Notes (Signed)
edp at bedside  

## 2016-01-10 NOTE — ED Notes (Signed)
Patient taken to CT with Junie Panning, RN

## 2016-01-10 NOTE — ED Notes (Signed)
Patient transported to X-ray 

## 2016-01-10 NOTE — ED Notes (Addendum)
Pt is moved to Trauma CT at this time.

## 2016-01-10 NOTE — Procedures (Signed)
Technically successful CT guided placed of a 10 Fr drainage catheter placement into the urinary bladder yielding clear urine.   EBL: None No immediate post procedural complications.   Ronny Bacon, MD Pager #: [phone]

## 2016-01-10 NOTE — Consult Note (Signed)
New Urology Consult Note   Requesting Attending Physician:  Trauma Md, [name] Service Providing Consult: Urology Consulting Attending: Matilde Sprang, [name]  Assessment:  Patient is a 75 y.o. male with history of DM, HTN, HLD presents with urethral injury and possible bladder neck injury s/p crush injury on 01/10/16 at ~4pm after tractor accident. CT A/P shows large extraperitoneal clot anterior to the bladder in the space of Retzius and RUG showed abrupt cessation of contrast opacification at the proximal bulbar urethra. Bedside cystoscopy by urology unsuccessful due to seemingly significant urethral injury near the membranous urethra. He has not voided since the injury. Cr 1.2-1.4. Known pelvic fractures secondary to Christian as well.   Recommendations: 1. We have discussed the case with IR - our recommendation is CT guided suprapubic catheter placement. Would request the highest caliber catheter that can be placed  2. Do not place anything via urethra. 3. Keep NPO at least until SPT placed 4. Patient will require cystogram as part of workup, after bladder adequately draining  Thank you for this consult. Please contact the urology consult pager with any further questions/concerns. Christian Base, [name] Urology Surgical Resident  I performed a history and physical examination of the patient and discussed his management with the resident.  I reviewed the resident's note and agree with the documented findings and plan of care       HPI -   Reason for Consult:  Blood at urethral meatus s/p Christian, concern for urethral injury  Christian [name] is seen in consultation for reasons noted above at the request of Trauma Md, [name].   This is a 75 yo patient with a history of Dm, HLD, HTN. He does carry a history of nephrolithiasis and has been evaluated by Christian [name] in the past. He last spontaneously passed a kidney stone about 3 weeks ago.   Earlier today, roughly around 4pm, he was involved in a crush injury  where a tractor gear malfunction pinned him between a building post and the tractor. The tractor somehow managed to move backwards allowing the patient to fall to the ground. He has not voided since the incident. He was brought to [facility] ER as a Christian. Blood at the urethral meatus was noted. He was having significant right hip pain. He has not eaten since arrival to Er. He is on an ASA 325 at home. No previous urologic surgeries. He feels more distended and has a tighter abdomen than normal.   BP has been low, responding somewhat to fluid resuscitation. Cr on arrival 1.44, 0.90 in May 2017. WBC 22.6. Lactic acid 5.8. Hb 10.8 on admission, now 8.8. Lactic acid up to 6.4, Cr actually down to 1.2 by 10pm.   CT A/P shows fractures of right sacral ala, bilateral parasymphyseal fractures about the pubic symphysis, right inferior pubic ramus fracture, hemorrhage/clot between symphysis and anterior bladder extending cephalad in space of Retzius. There is no contrast extravasation on delayed imaging.   RUG shows normal anterior urethra with abrupt cutoff of contrast despite likely adequate pressure at the bulbous urethra.   Past Medical History: Past Medical History:  Diagnosis Date  . Diabetes mellitus without complication ([place])   . Hyperlipidemia   . Hypertension     Past Surgical History:  Past Surgical History:  Procedure Laterality Date  . ACHILLES TENDON SURGERY Right   . TONSILLECTOMY    . ULNAR NERVE TRANSPOSITION Right     Medication: Current Facility-Administered Medications  Medication Dose Route Frequency Provider Last Rate  Last Dose  . lidocaine (XYLOCAINE) 1 % (with pres) injection            Current Outpatient Prescriptions  Medication Sig Dispense Refill  . amLODipine (NORVASC) 5 MG tablet Take 7.5 mg by mouth daily.    Marland Kitchen aspirin (GOODSENSE ASPIRIN) 325 MG tablet Take 325 mg by mouth daily.    Marland Kitchen atorvastatin (LIPITOR) 80 MG tablet Take 80 mg by mouth daily.    Marland Kitchen CINNAMON PO  Take 1 capsule by mouth at bedtime.    . fluticasone (FLONASE) 50 MCG/ACT nasal spray Place 2 sprays into both nostrils daily.    Marland Kitchen gabapentin (NEURONTIN) 300 MG capsule Take 300 mg by mouth 3 (three) times daily.    Marland Kitchen glipiZIDE (GLUCOTROL) 5 MG tablet Take 5-10 mg by mouth See admin instructions. 10 mg in the morning and 5 mg in the evening    . LEVEMIR FLEXTOUCH 100 UNIT/ML Pen Inject 5 Units into the skin at bedtime.     Marland Kitchen losartan (COZAAR) 50 MG tablet Take 50 mg by mouth daily.    . metFORMIN (GLUCOPHAGE) 1000 MG tablet Take 1,000 mg by mouth 2 (two) times daily.    . traMADol (ULTRAM) 50 MG tablet Take 50 mg by mouth every 6 (six) hours as needed for pain.      Allergies: No Known Allergies  Social History: Social History  Substance Use Topics  . Smoking status: Never Smoker  . Smokeless tobacco: Never Used  . Alcohol use No    Family History History reviewed. No pertinent family history.  Review of Systems 10 systems were reviewed and are negative except as noted specifically in the HPI.  Objective   Vital signs in last 24 hours: BP (!) 96/50   Pulse 103   Resp 23   Ht 6\' 2"  (1.88 m)   Wt 222 lb (100.7 kg)   SpO2 94%   BMI 28.50 kg/m   Intake/Output last 3 shifts: I/O last 3 completed shifts: In: 1000 [I.V.:1000] Out: 0   Physical Exam General: distressed with positional changes, A&O, resting, eyes closed for majority of visit HEENT: [place]/AT, EOMI, MMM Pulmonary: Normal work of breathing on RA Cardiovascular: Tachycardic, adequate peripheral perfusion Abdomen: soft, distended, diffuse TTP including suprapubic area, no flank ecchymosis, left flank abrasions noted GU: uncircumcised male phallus, no visible blood via urethral meatus on my visit, bilateral descended testicles, no perineal ecchymosis Extremities: warm and well perfused, no edema  Most Recent Labs: Lab Results  Component Value Date   WBC 22.6 (H) 01/10/2016   HGB 8.8 (L) 01/10/2016   HCT 26.0  (L) 01/10/2016   PLT 194 01/10/2016    Lab Results  Component Value Date   NA 140 01/10/2016   K 4.9 01/10/2016   CL 108 01/10/2016   CO2 16 (L) 01/10/2016   BUN 21 (H) 01/10/2016   CREATININE 1.20 01/10/2016   CALCIUM 8.2 (L) 01/10/2016    No results found for: ALKPHOS, BILITOT, BILIDIR, PROT, ALBUMIN, ALT, AST, GGT  No results found for: INR, APTT   Urine Culture: None   IMAGING: Ct Chest W Contrast  Result Date: 01/10/2016 CLINICAL DATA:  Level 1 Christian, bleeding at the meatus of the penis. EXAM: CT CHEST WITH CONTRAST CT ABDOMEN AND PELVIS WITH AND WITHOUT CONTRAST TECHNIQUE: Multidetector CT imaging of the chest was performed during intravenous contrast administration. Multidetector CT imaging of the abdomen and pelvis was performed following the standard protocol before and during bolus administration of  intravenous contrast. CONTRAST:  152mL ISOVUE-300 IOPAMIDOL (ISOVUE-300) INJECTION 61% COMPARISON:  12/18/2015 CT abdomen and pelvis FINDINGS: CT CHEST FINDINGS Cardiovascular: Normal size cardiac chambers. No pericardial effusion. Minimal coronary arteriosclerosis. Mediastinum/Nodes: No mediastinal hematoma. Normal branching pattern of the great vessels. Slightly ectatic ascending thoracic aorta measuring up to 3.6 cm at the level of the main pulmonary artery. No dissection of the thoracic aorta. Lungs/Pleura: No effusion or pneumothorax. Bibasilar atelectasis. Pulmonary contusion nor consolidation. Musculoskeletal: Old right eighth rib fracture with subtle deformity. No acute displaced fracture of the bony thorax. Osteoarthritis of the sternoclavicular joints. CT ABDOMEN AND PELVIS FINDINGS Hepatobiliary: Slightly hypodense appearance of the liver which may reflect fatty infiltration. No space-occupying mass. The left lobe is seen draped over the normal size spleen. Pancreas: Unremarkable. No pancreatic ductal dilatation or surrounding inflammatory changes. Spleen: No splenic injury  or perisplenic hematoma. Small splenule measuring 7 mm Adrenals/Urinary Tract: Bilateral renal cysts are identified the largest is interpolar on the right measuring 1.6 cm and on the left, involving the lower pole measuring 1.9 cm. Bilateral punctate calcifications are identified the largest is on the left measuring 2 mm. The bladder is contracted in appearance. Overlying the bladder anteriorly in the space of Retzius and extending cephalad is heterogeneous isodense to hyperdense fluid in keeping with hemorrhage and clot. This measures approximately 12.9 cm craniocaudad by 3 cm AP x 12.4 cm transverse. Delayed images were repeated through the bladder demonstrating contrast opacified urine along an intact posterior bladder wall. The anterior wall where the adjacent hemorrhage however is seen cannot be definitely cleared for tear/rupture. Stomach/Bowel: Stomach is within normal limits. Appendix appears normal. No evidence of bowel wall thickening, distention, or inflammatory changes. Vascular/Lymphatic: Aortic atherosclerosis. No enlarged abdominal or pelvic lymph nodes. Reproductive: Prostate is unremarkable. Other: Retroperitoneal hematoma with a asymmetric enlargement of the right psoas muscle suggesting an intramuscular component to hematoma. Overlying ill-defined free fluid is noted abutting the right psoas muscle. Soft tissue induration of the right gluteal soft tissues likely representing contusion. Musculoskeletal: There are acute fractures of the bony pelvis which are as follows: 1. Sagittal right sacral alar fracture extending across the right sacroiliac joint and involving the right iliac bone. This appears to spare the sacral neural foramina. 2. Bilateral parasymphyseal fractures extending into the pubic symphysis posteriorly. 3.  Right inferior pubic ramus fracture with minimal displacement. There is lower lumbar degenerative disc disease at L4-5 and L5-S1. Multilevel facet hypertrophy and sclerosis most  prominently at L5-S1 causing moderate to marked neural foraminal stenosis. No acute osseous abnormality of the dorsal spine. IMPRESSION: 1. Acute fractures of the right sacral ala extending across the right sacroiliac joint and involving the posterior right iliac bone. No significant displacement. 2. Acute bilateral parasymphyseal fractures with fracture lucencies extending into the pubic symphysis. 3. Acute minimally displaced right inferior pubic ramus fracture. 4. Hemorrhage and clot interposed between the pubic symphysis and bladder extending cephalad in the space of Retzius, measuring approximately 12.9 x 3 x 12.4 cm. No acute chest abnormalities. These results were called by telephone at the time of interpretation on 01/10/2016 at 7:20 pm to Dr. Gershon Crane , who verbally acknowledged these results. Electronically Signed   By: Ashley Royalty M.D.   On: 01/10/2016 19:35   Ct Abdomen Pelvis W Contrast  Result Date: 01/10/2016 CLINICAL DATA:  Level 1 Christian, bleeding at the meatus of the penis. EXAM: CT CHEST WITH CONTRAST CT ABDOMEN AND PELVIS WITH AND WITHOUT CONTRAST TECHNIQUE: Multidetector CT imaging of  the chest was performed during intravenous contrast administration. Multidetector CT imaging of the abdomen and pelvis was performed following the standard protocol before and during bolus administration of intravenous contrast. CONTRAST:  142mL ISOVUE-300 IOPAMIDOL (ISOVUE-300) INJECTION 61% COMPARISON:  12/18/2015 CT abdomen and pelvis FINDINGS: CT CHEST FINDINGS Cardiovascular: Normal size cardiac chambers. No pericardial effusion. Minimal coronary arteriosclerosis. Mediastinum/Nodes: No mediastinal hematoma. Normal branching pattern of the great vessels. Slightly ectatic ascending thoracic aorta measuring up to 3.6 cm at the level of the main pulmonary artery. No dissection of the thoracic aorta. Lungs/Pleura: No effusion or pneumothorax. Bibasilar atelectasis. Pulmonary contusion nor consolidation.  Musculoskeletal: Old right eighth rib fracture with subtle deformity. No acute displaced fracture of the bony thorax. Osteoarthritis of the sternoclavicular joints. CT ABDOMEN AND PELVIS FINDINGS Hepatobiliary: Slightly hypodense appearance of the liver which may reflect fatty infiltration. No space-occupying mass. The left lobe is seen draped over the normal size spleen. Pancreas: Unremarkable. No pancreatic ductal dilatation or surrounding inflammatory changes. Spleen: No splenic injury or perisplenic hematoma. Small splenule measuring 7 mm Adrenals/Urinary Tract: Bilateral renal cysts are identified the largest is interpolar on the right measuring 1.6 cm and on the left, involving the lower pole measuring 1.9 cm. Bilateral punctate calcifications are identified the largest is on the left measuring 2 mm. The bladder is contracted in appearance. Overlying the bladder anteriorly in the space of Retzius and extending cephalad is heterogeneous isodense to hyperdense fluid in keeping with hemorrhage and clot. This measures approximately 12.9 cm craniocaudad by 3 cm AP x 12.4 cm transverse. Delayed images were repeated through the bladder demonstrating contrast opacified urine along an intact posterior bladder wall. The anterior wall where the adjacent hemorrhage however is seen cannot be definitely cleared for tear/rupture. Stomach/Bowel: Stomach is within normal limits. Appendix appears normal. No evidence of bowel wall thickening, distention, or inflammatory changes. Vascular/Lymphatic: Aortic atherosclerosis. No enlarged abdominal or pelvic lymph nodes. Reproductive: Prostate is unremarkable. Other: Retroperitoneal hematoma with a asymmetric enlargement of the right psoas muscle suggesting an intramuscular component to hematoma. Overlying ill-defined free fluid is noted abutting the right psoas muscle. Soft tissue induration of the right gluteal soft tissues likely representing contusion. Musculoskeletal: There are  acute fractures of the bony pelvis which are as follows: 1. Sagittal right sacral alar fracture extending across the right sacroiliac joint and involving the right iliac bone. This appears to spare the sacral neural foramina. 2. Bilateral parasymphyseal fractures extending into the pubic symphysis posteriorly. 3.  Right inferior pubic ramus fracture with minimal displacement. There is lower lumbar degenerative disc disease at L4-5 and L5-S1. Multilevel facet hypertrophy and sclerosis most prominently at L5-S1 causing moderate to marked neural foraminal stenosis. No acute osseous abnormality of the dorsal spine. IMPRESSION: 1. Acute fractures of the right sacral ala extending across the right sacroiliac joint and involving the posterior right iliac bone. No significant displacement. 2. Acute bilateral parasymphyseal fractures with fracture lucencies extending into the pubic symphysis. 3. Acute minimally displaced right inferior pubic ramus fracture. 4. Hemorrhage and clot interposed between the pubic symphysis and bladder extending cephalad in the space of Retzius, measuring approximately 12.9 x 3 x 12.4 cm. No acute chest abnormalities. These results were called by telephone at the time of interpretation on 01/10/2016 at 7:20 pm to Dr. Gershon Crane , who verbally acknowledged these results. Electronically Signed   By: Ashley Royalty M.D.   On: 01/10/2016 19:35   Dg Retrograde-urethrogram  Result Date: 01/10/2016 CLINICAL DATA:  Tractor accident.  Pelvic fractures. EXAM: RETROGRADE URETHROGRAPHY TECHNIQUE: Following catheterization of the urethral meatus water-soluble contrast material was injected in a retrograde fashion into the urethra. CONTRAST:  30 cc Conrad Cysto FLUOROSCOPY TIME:  Radiation Exposure Index (as provided by the fluoroscopic device): If the device does not provide the exposure index: Fluoroscopy Time (in minutes and seconds):  1 minutes 20 seconds Number of Acquired Images:  13 COMPARISON:  CT 01/10/2016  FINDINGS: At the outset of the examination there was already contrast material within the urinary bladder from the CT of the abdomen and pelvis performed earlier. Retrograde injection of the Foley catheter resultant in Opacification of the distal and mid urethra to the level of the bulbous urethra. No passage of contrast material beyond the level of the bulbous urethra identified. Venous intravasation was visualized at the level of the bulbous urethra indicating adequate injection pressures. No extravasation of contrast material from the urethra identified . IMPRESSION: Imaging findings compatible with an obstructed urethrogram at the junction between the bulbous urethra and membranous urethra. No extravasation of contrast material from the urethra identified. These results were called by telephone at the time of interpretation on 01/10/2016 at 8:48 pm to Dr. Dayna Barker , who verbally acknowledged these results. Electronically Signed   By: Kerby Moors M.D.   On: 01/10/2016 20:51    PROCEDURES -  Cystourethroscopy  The patient was in the supine position on arrival. He was prepped with Betadine solution in usual fashion and draped with sterile blue towels. 2% viscous lidocaine jelly was instilled in the urethra. The 16Fr flexible cystoscope was inserted with normal saline irrigation running. Urethroscopy showed a normal anterior urethra. However, at the presumed location of the membranous sphincter there was obvious injury with intraurethral clot, ratty & friable/denuded tissue noted. Several attempts were made at finding the true urethral lumen and then advancing the scope into the bladder but we were unable to do so. No prostatic or bladder normal anatomy were identified. There were moments where fat lobules appeared to be visualized, clearly well outside of the normal anatomic path if truly seen. The decision was made to abort attempts at cystoscopic difficult Foley catheterization. The cystoscope was removed.

## 2016-01-10 NOTE — ED Notes (Signed)
Pt. Noted to have systolic BP [id number]. Pt. Diaphoretic, clammy, and pale. EDP called out level 1 trauma. 1085mL normal saline bolus started on pressure bag with verbal order from EDP. Pt. Moved to trauma room with Trauma physician at bedside.

## 2016-01-10 NOTE — Consult Note (Signed)
Reason for Consult: pelvic fractures/crush injury Referring Physician:   EDP  Christian [name] is an 75 y.o. male.  HPI:   75 yo male who was working on a tractor when it came out of gear and rolled onto him.  He was brought in to the ED as a trauma code.  Ortho is consulted to assess his pelvic fractures.  He reports right hip and pelvic pain.  He denies numbness or weakness in his feet or other injuries.  Past Medical History:  Diagnosis Date  . Diabetes mellitus without complication ([place])   . Hyperlipidemia   . Hypertension     Past Surgical History:  Procedure Laterality Date  . ACHILLES TENDON SURGERY Right   . TONSILLECTOMY    . ULNAR NERVE TRANSPOSITION Right     History reviewed. No pertinent family history.  Social History:  reports that he has never smoked. He has never used smokeless tobacco. He reports that he does not drink alcohol. His drug history is not on file.  Allergies: No Known Allergies  Medications: I have reviewed the patient's current medications.  Results for orders placed or performed during the hospital encounter of 01/10/16 (from the past 48 hour(s))  CBC with Differential     Status: Abnormal   Collection Time: 01/10/16  5:47 PM  Result Value Ref Range   WBC 22.6 (H) 4.0 - 10.5 K/uL   RBC 3.61 (L) 4.22 - 5.81 MIL/uL   Hemoglobin 10.8 (L) 13.0 - 17.0 g/dL   HCT 32.8 (L) 39.0 - 52.0 %   MCV 90.9 78.0 - 100.0 fL   MCH 29.9 26.0 - 34.0 pg   MCHC 32.9 30.0 - 36.0 g/dL   RDW 13.0 11.5 - 15.5 %   Platelets 194 150 - 400 K/uL   Neutrophils Relative % 78 %   Lymphocytes Relative 15 %   Monocytes Relative 6 %   Eosinophils Relative 1 %   Basophils Relative 0 %   Neutro Abs 17.6 (H) 1.7 - 7.7 K/uL   Lymphs Abs 3.4 0.7 - 4.0 K/uL   Monocytes Absolute 1.4 (H) 0.1 - 1.0 K/uL   Eosinophils Absolute 0.2 0.0 - 0.7 K/uL   Basophils Absolute 0.0 0.0 - 0.1 K/uL   RBC Morphology ELLIPTOCYTES     Comment: BURR CELLS   WBC Morphology INCREASED BANDS (>20%  BANDS)   Basic metabolic panel     Status: Abnormal   Collection Time: 01/10/16  5:47 PM  Result Value Ref Range   Sodium 135 135 - 145 mmol/L   Potassium 4.2 3.5 - 5.1 mmol/L   Chloride 106 101 - 111 mmol/L   CO2 16 (L) 22 - 32 mmol/L   Glucose, Bld 328 (H) 65 - 99 mg/dL   BUN 19 6 - 20 mg/dL   Creatinine, Ser 1.44 (H) 0.61 - 1.24 mg/dL   Calcium 8.2 (L) 8.9 - 10.3 mg/dL   GFR calc non Af Amer 46 (L) >60 mL/min   GFR calc Af Amer 54 (L) >60 mL/min    Comment: (NOTE) The eGFR has been calculated using the CKD EPI equation. This calculation has not been validated in all clinical situations. eGFR's persistently <60 mL/min signify possible Chronic Kidney Disease.    Anion gap 13 5 - 15  Prepare fresh frozen plasma     Status: None   Collection Time: 01/10/16  6:09 PM  Result Value Ref Range   Unit Number [id number]    Blood Component Type LIQ  PLASMA    Unit division 00    Status of Unit REL FROM [facility]    Unit tag comment VERBAL ORDERS PER DR MESNER    Transfusion Status OK TO TRANSFUSE    Unit Number [id number]    Blood Component Type LIQ PLASMA    Unit division 00    Status of Unit REL FROM [facility]    Unit tag comment VERBAL ORDERS PER DR MESNER    Transfusion Status OK TO TRANSFUSE   I-Stat Chem 8, ED     Status: Abnormal   Collection Time: 01/10/16  6:19 PM  Result Value Ref Range   Sodium 138 135 - 145 mmol/L   Potassium 4.3 3.5 - 5.1 mmol/L   Chloride 109 101 - 111 mmol/L   BUN 20 6 - 20 mg/dL   Creatinine, Ser 1.30 (H) 0.61 - 1.24 mg/dL   Glucose, Bld 325 (H) 65 - 99 mg/dL   Calcium, Ion 1.02 (L) 1.15 - 1.40 mmol/L   TCO2 17 0 - 100 mmol/L   Hemoglobin 10.5 (L) 13.0 - 17.0 g/dL   HCT 31.0 (L) 39.0 - 52.0 %  I-Stat CG4 Lactic Acid, ED     Status: Abnormal   Collection Time: 01/10/16  6:20 PM  Result Value Ref Range   Lactic Acid, Venous 5.80 (HH) 0.5 - 1.9 mmol/L   Comment NOTIFIED PHYSICIAN   Type and screen     Status: None   Collection Time: 01/10/16   6:22 PM  Result Value Ref Range   ABO/RH(D) O POS    Antibody Screen NEG    Sample Expiration 01/13/2016    Unit Number [id number]    Blood Component Type RED CELLS,LR    Unit division 00    Status of Unit REL FROM [facility]    Unit tag comment VERBAL ORDERS PER DR MESNER    Transfusion Status OK TO TRANSFUSE    Crossmatch Result COMPATIBLE    Unit Number [id number]    Blood Component Type RED CELLS,LR    Unit division 00    Status of Unit REL FROM [facility]    Unit tag comment VERBAL ORDERS PER DR MESNER    Transfusion Status OK TO TRANSFUSE    Crossmatch Result COMPATIBLE   ABO/Rh     Status: None   Collection Time: 01/10/16  6:22 PM  Result Value Ref Range   ABO/RH(D) O POS     Ct Chest W Contrast  Result Date: 01/10/2016 CLINICAL DATA:  Level 1 trauma, bleeding at the meatus of the penis. EXAM: CT CHEST WITH CONTRAST CT ABDOMEN AND PELVIS WITH AND WITHOUT CONTRAST TECHNIQUE: Multidetector CT imaging of the chest was performed during intravenous contrast administration. Multidetector CT imaging of the abdomen and pelvis was performed following the standard protocol before and during bolus administration of intravenous contrast. CONTRAST:  132m ISOVUE-300 IOPAMIDOL (ISOVUE-300) INJECTION 61% COMPARISON:  12/18/2015 CT abdomen and pelvis FINDINGS: CT CHEST FINDINGS Cardiovascular: Normal size cardiac chambers. No pericardial effusion. Minimal coronary arteriosclerosis. Mediastinum/Nodes: No mediastinal hematoma. Normal branching pattern of the great vessels. Slightly ectatic ascending thoracic aorta measuring up to 3.6 cm at the level of the main pulmonary artery. No dissection of the thoracic aorta. Lungs/Pleura: No effusion or pneumothorax. Bibasilar atelectasis. Pulmonary contusion nor consolidation. Musculoskeletal: Old right eighth rib fracture with subtle deformity. No acute displaced fracture of the bony thorax. Osteoarthritis of the sternoclavicular joints. CT ABDOMEN AND PELVIS  FINDINGS Hepatobiliary: Slightly hypodense appearance of the liver which may  reflect fatty infiltration. No space-occupying mass. The left lobe is seen draped over the normal size spleen. Pancreas: Unremarkable. No pancreatic ductal dilatation or surrounding inflammatory changes. Spleen: No splenic injury or perisplenic hematoma. Small splenule measuring 7 mm Adrenals/Urinary Tract: Bilateral renal cysts are identified the largest is interpolar on the right measuring 1.6 cm and on the left, involving the lower pole measuring 1.9 cm. Bilateral punctate calcifications are identified the largest is on the left measuring 2 mm. The bladder is contracted in appearance. Overlying the bladder anteriorly in the space of Retzius and extending cephalad is heterogeneous isodense to hyperdense fluid in keeping with hemorrhage and clot. This measures approximately 12.9 cm craniocaudad by 3 cm AP x 12.4 cm transverse. Delayed images were repeated through the bladder demonstrating contrast opacified urine along an intact posterior bladder wall. The anterior wall where the adjacent hemorrhage however is seen cannot be definitely cleared for tear/rupture. Stomach/Bowel: Stomach is within normal limits. Appendix appears normal. No evidence of bowel wall thickening, distention, or inflammatory changes. Vascular/Lymphatic: Aortic atherosclerosis. No enlarged abdominal or pelvic lymph nodes. Reproductive: Prostate is unremarkable. Other: Retroperitoneal hematoma with a asymmetric enlargement of the right psoas muscle suggesting an intramuscular component to hematoma. Overlying ill-defined free fluid is noted abutting the right psoas muscle. Soft tissue induration of the right gluteal soft tissues likely representing contusion. Musculoskeletal: There are acute fractures of the bony pelvis which are as follows: 1. Sagittal right sacral alar fracture extending across the right sacroiliac joint and involving the right iliac bone. This  appears to spare the sacral neural foramina. 2. Bilateral parasymphyseal fractures extending into the pubic symphysis posteriorly. 3.  Right inferior pubic ramus fracture with minimal displacement. There is lower lumbar degenerative disc disease at L4-5 and L5-S1. Multilevel facet hypertrophy and sclerosis most prominently at L5-S1 causing moderate to marked neural foraminal stenosis. No acute osseous abnormality of the dorsal spine. IMPRESSION: 1. Acute fractures of the right sacral ala extending across the right sacroiliac joint and involving the posterior right iliac bone. No significant displacement. 2. Acute bilateral parasymphyseal fractures with fracture lucencies extending into the pubic symphysis. 3. Acute minimally displaced right inferior pubic ramus fracture. 4. Hemorrhage and clot interposed between the pubic symphysis and bladder extending cephalad in the space of Retzius, measuring approximately 12.9 x 3 x 12.4 cm. No acute chest abnormalities. These results were called by telephone at the time of interpretation on 01/10/2016 at 7:20 pm to Dr. Gershon Crane , who verbally acknowledged these results. Electronically Signed   By: Ashley Royalty M.D.   On: 01/10/2016 19:35   Ct Abdomen Pelvis W Contrast  Result Date: 01/10/2016 CLINICAL DATA:  Level 1 trauma, bleeding at the meatus of the penis. EXAM: CT CHEST WITH CONTRAST CT ABDOMEN AND PELVIS WITH AND WITHOUT CONTRAST TECHNIQUE: Multidetector CT imaging of the chest was performed during intravenous contrast administration. Multidetector CT imaging of the abdomen and pelvis was performed following the standard protocol before and during bolus administration of intravenous contrast. CONTRAST:  167m ISOVUE-300 IOPAMIDOL (ISOVUE-300) INJECTION 61% COMPARISON:  12/18/2015 CT abdomen and pelvis FINDINGS: CT CHEST FINDINGS Cardiovascular: Normal size cardiac chambers. No pericardial effusion. Minimal coronary arteriosclerosis. Mediastinum/Nodes: No mediastinal  hematoma. Normal branching pattern of the great vessels. Slightly ectatic ascending thoracic aorta measuring up to 3.6 cm at the level of the main pulmonary artery. No dissection of the thoracic aorta. Lungs/Pleura: No effusion or pneumothorax. Bibasilar atelectasis. Pulmonary contusion nor consolidation. Musculoskeletal: Old right eighth rib fracture with subtle  deformity. No acute displaced fracture of the bony thorax. Osteoarthritis of the sternoclavicular joints. CT ABDOMEN AND PELVIS FINDINGS Hepatobiliary: Slightly hypodense appearance of the liver which may reflect fatty infiltration. No space-occupying mass. The left lobe is seen draped over the normal size spleen. Pancreas: Unremarkable. No pancreatic ductal dilatation or surrounding inflammatory changes. Spleen: No splenic injury or perisplenic hematoma. Small splenule measuring 7 mm Adrenals/Urinary Tract: Bilateral renal cysts are identified the largest is interpolar on the right measuring 1.6 cm and on the left, involving the lower pole measuring 1.9 cm. Bilateral punctate calcifications are identified the largest is on the left measuring 2 mm. The bladder is contracted in appearance. Overlying the bladder anteriorly in the space of Retzius and extending cephalad is heterogeneous isodense to hyperdense fluid in keeping with hemorrhage and clot. This measures approximately 12.9 cm craniocaudad by 3 cm AP x 12.4 cm transverse. Delayed images were repeated through the bladder demonstrating contrast opacified urine along an intact posterior bladder wall. The anterior wall where the adjacent hemorrhage however is seen cannot be definitely cleared for tear/rupture. Stomach/Bowel: Stomach is within normal limits. Appendix appears normal. No evidence of bowel wall thickening, distention, or inflammatory changes. Vascular/Lymphatic: Aortic atherosclerosis. No enlarged abdominal or pelvic lymph nodes. Reproductive: Prostate is unremarkable. Other: Retroperitoneal  hematoma with a asymmetric enlargement of the right psoas muscle suggesting an intramuscular component to hematoma. Overlying ill-defined free fluid is noted abutting the right psoas muscle. Soft tissue induration of the right gluteal soft tissues likely representing contusion. Musculoskeletal: There are acute fractures of the bony pelvis which are as follows: 1. Sagittal right sacral alar fracture extending across the right sacroiliac joint and involving the right iliac bone. This appears to spare the sacral neural foramina. 2. Bilateral parasymphyseal fractures extending into the pubic symphysis posteriorly. 3.  Right inferior pubic ramus fracture with minimal displacement. There is lower lumbar degenerative disc disease at L4-5 and L5-S1. Multilevel facet hypertrophy and sclerosis most prominently at L5-S1 causing moderate to marked neural foraminal stenosis. No acute osseous abnormality of the dorsal spine. IMPRESSION: 1. Acute fractures of the right sacral ala extending across the right sacroiliac joint and involving the posterior right iliac bone. No significant displacement. 2. Acute bilateral parasymphyseal fractures with fracture lucencies extending into the pubic symphysis. 3. Acute minimally displaced right inferior pubic ramus fracture. 4. Hemorrhage and clot interposed between the pubic symphysis and bladder extending cephalad in the space of Retzius, measuring approximately 12.9 x 3 x 12.4 cm. No acute chest abnormalities. These results were called by telephone at the time of interpretation on 01/10/2016 at 7:20 pm to Dr. Gershon Crane , who verbally acknowledged these results. Electronically Signed   By: Ashley Royalty M.D.   On: 01/10/2016 19:35   Dg Retrograde-urethrogram  Result Date: 01/10/2016 CLINICAL DATA:  Tractor accident.  Pelvic fractures. EXAM: RETROGRADE URETHROGRAPHY TECHNIQUE: Following catheterization of the urethral meatus water-soluble contrast material was injected in a retrograde fashion  into the urethra. CONTRAST:  30 cc Conrad Cysto FLUOROSCOPY TIME:  Radiation Exposure Index (as provided by the fluoroscopic device): If the device does not provide the exposure index: Fluoroscopy Time (in minutes and seconds):  1 minutes 20 seconds Number of Acquired Images:  13 COMPARISON:  CT 01/10/2016 FINDINGS: At the outset of the examination there was already contrast material within the urinary bladder from the CT of the abdomen and pelvis performed earlier. Retrograde injection of the Foley catheter resultant in Opacification of the distal and mid urethra to  the level of the bulbous urethra. No passage of contrast material beyond the level of the bulbous urethra identified. Venous intravasation was visualized at the level of the bulbous urethra indicating adequate injection pressures. No extravasation of contrast material from the urethra identified . IMPRESSION: Imaging findings compatible with an obstructed urethrogram at the junction between the bulbous urethra and membranous urethra. No extravasation of contrast material from the urethra identified. These results were called by telephone at the time of interpretation on 01/10/2016 at 8:48 pm to Dr. Dayna Barker , who verbally acknowledged these results. Electronically Signed   By: Kerby Moors M.D.   On: 01/10/2016 20:51    ROS Blood pressure (!) 91/50, pulse 97, resp. rate 16, height _0  (1.88 m), weight 100.7 kg (222 lb), SpO2 (!) 89 %. Physical Exam  Constitutional: He is oriented to person, place, and time. He appears well-developed and well-nourished.  HENT:  Head: Normocephalic and atraumatic.  Eyes: EOM are normal. Pupils are equal, round, and reactive to light.  Neck: Normal range of motion. Neck supple.  Cardiovascular: Normal rate.   Musculoskeletal:       Right hip: He exhibits decreased range of motion, decreased strength and bony tenderness.  Neurological: He is alert and oriented to person, place, and time.   His right  hip/pelvis/pubis hurts to palpation and with motion. Both feet have normal sensation, normal perfusion, and normal motor function.   Assessment/Plan: Crush injury to the pelvis with right-sided pelvic fractures - sacral ala, ischial, pubis, rami. 1)  From an ortho standpoint, he should only touch-down weight bear on his right leg until further notice.  Mcarthur Rossetti 01/10/2016, 9:31 PM

## 2016-01-11 LAB — BASIC METABOLIC PANEL
Anion gap: 9 (ref 5–15)
BUN: 30 mg/dL — AB (ref 6–20)
CALCIUM: 7.2 mg/dL — AB (ref 8.9–10.3)
CO2: 16 mmol/L — AB (ref 22–32)
CREATININE: 2.29 mg/dL — AB (ref 0.61–1.24)
Chloride: 110 mmol/L (ref 101–111)
GFR calc Af Amer: 31 mL/min — ABNORMAL LOW (ref >60)
GFR, EST NON AFRICAN AMERICAN: 26 mL/min — AB (ref >60)
GLUCOSE: 341 mg/dL — AB (ref 65–99)
Potassium: 5.2 mmol/L — ABNORMAL HIGH (ref 3.5–5.1)
Sodium: 135 mmol/L (ref 135–145)

## 2016-01-11 LAB — URINE MICROSCOPIC-ADD ON: WBC, UA: NONE SEEN WBC/hpf (ref 0–5)

## 2016-01-11 LAB — CBC
HEMATOCRIT: 25.7 % — AB (ref 39.0–52.0)
HEMOGLOBIN: 8.2 g/dL — AB (ref 13.0–17.0)
MCH: 29.6 pg (ref 26.0–34.0)
MCHC: 31.9 g/dL (ref 30.0–36.0)
MCV: 92.8 fL (ref 78.0–100.0)
Platelets: 171 10*3/uL (ref 150–400)
RBC: 2.77 MIL/uL — AB (ref 4.22–5.81)
RDW: 13.2 % (ref 11.5–15.5)
WBC: 15.1 10*3/uL — AB (ref 4.0–10.5)

## 2016-01-11 LAB — COMPREHENSIVE METABOLIC PANEL
ALBUMIN: 2.9 g/dL — AB (ref 3.5–5.0)
ALK PHOS: 43 U/L (ref 38–126)
ALT: 33 U/L (ref 17–63)
AST: 46 U/L — AB (ref 15–41)
Anion gap: 17 — ABNORMAL HIGH (ref 5–15)
BILIRUBIN TOTAL: 0.6 mg/dL (ref 0.3–1.2)
BUN: 24 mg/dL — AB (ref 6–20)
CALCIUM: 7.4 mg/dL — AB (ref 8.9–10.3)
CO2: 10 mmol/L — ABNORMAL LOW (ref 22–32)
CREATININE: 2.07 mg/dL — AB (ref 0.61–1.24)
Chloride: 111 mmol/L (ref 101–111)
GFR calc Af Amer: 35 mL/min — ABNORMAL LOW (ref >60)
GFR calc non Af Amer: 30 mL/min — ABNORMAL LOW (ref >60)
GLUCOSE: 424 mg/dL — AB (ref 65–99)
POTASSIUM: 7 mmol/L — AB (ref 3.5–5.1)
Sodium: 138 mmol/L (ref 135–145)
TOTAL PROTEIN: 4.8 g/dL — AB (ref 6.5–8.1)

## 2016-01-11 LAB — URINALYSIS, ROUTINE W REFLEX MICROSCOPIC
BILIRUBIN URINE: NEGATIVE
Glucose, UA: 500 mg/dL — AB
KETONES UR: NEGATIVE mg/dL
Leukocytes, UA: NEGATIVE
NITRITE: NEGATIVE
Protein, ur: NEGATIVE mg/dL
SPECIFIC GRAVITY, URINE: 1.025 (ref 1.005–1.030)
pH: 5 (ref 5.0–8.0)

## 2016-01-11 LAB — GLUCOSE, CAPILLARY
GLUCOSE-CAPILLARY: 431 mg/dL — AB (ref 65–99)
GLUCOSE-CAPILLARY: 394 mg/dL — AB (ref 65–99)
GLUCOSE-CAPILLARY: 258 mg/dL — AB (ref 65–99)
Glucose-Capillary: 392 mg/dL — ABNORMAL HIGH (ref 65–99)
Glucose-Capillary: 182 mg/dL — ABNORMAL HIGH (ref 65–99)

## 2016-01-11 LAB — PROTIME-INR
INR: 1.28
Prothrombin Time: 16.1 seconds — ABNORMAL HIGH (ref 11.4–15.2)

## 2016-01-11 LAB — CDS SEROLOGY

## 2016-01-11 LAB — APTT: APTT: 30 s (ref 24–36)

## 2016-01-11 LAB — MRSA PCR SCREENING: MRSA BY PCR: NEGATIVE

## 2016-01-11 MED ORDER — CALCIUM CARBONATE 1250 (500 CA) MG PO TABS
1.0000 | ORAL_TABLET | Freq: Three times a day (TID) | ORAL | Status: DC
  Administered 2016-01-11 – 2016-01-17 (×18): 500 mg via ORAL
  Filled 2016-01-11 (×18): qty 1

## 2016-01-11 MED ORDER — INSULIN ASPART 100 UNIT/ML ~~LOC~~ SOLN
10.0000 [IU] | Freq: Once | SUBCUTANEOUS | Status: AC
  Administered 2016-01-11: 10 [IU] via SUBCUTANEOUS

## 2016-01-11 MED ORDER — FUROSEMIDE 10 MG/ML IJ SOLN
20.0000 mg | Freq: Once | INTRAMUSCULAR | Status: AC
  Administered 2016-01-11: 20 mg via INTRAVENOUS
  Filled 2016-01-11: qty 2

## 2016-01-11 MED ORDER — GLIPIZIDE 5 MG PO TABS
5.0000 mg | ORAL_TABLET | Freq: Every day | ORAL | Status: DC
  Administered 2016-01-11 – 2016-01-16 (×6): 5 mg via ORAL
  Filled 2016-01-11 (×6): qty 1

## 2016-01-11 MED ORDER — HYDROMORPHONE HCL 1 MG/ML IJ SOLN
1.0000 mg | INTRAMUSCULAR | Status: DC | PRN
  Administered 2016-01-11 – 2016-01-13 (×3): 1 mg via INTRAVENOUS
  Filled 2016-01-11 (×3): qty 1

## 2016-01-11 MED ORDER — SODIUM CHLORIDE 0.9 % IV SOLN
INTRAVENOUS | Status: DC
  Administered 2016-01-11 – 2016-01-12 (×6): via INTRAVENOUS
  Administered 2016-01-13: 50 mL/h via INTRAVENOUS

## 2016-01-11 MED ORDER — SODIUM POLYSTYRENE SULFONATE 15 GM/60ML PO SUSP
15.0000 g | Freq: Once | ORAL | Status: AC
  Administered 2016-01-11: 15 g via ORAL
  Filled 2016-01-11: qty 60

## 2016-01-11 MED ORDER — ONDANSETRON HCL 4 MG PO TABS: 4.0000 mg | ORAL_TABLET | Freq: Four times a day (QID) | ORAL | Status: DC | PRN

## 2016-01-11 MED ORDER — LOSARTAN POTASSIUM 50 MG PO TABS
50.0000 mg | ORAL_TABLET | Freq: Every day | ORAL | Status: DC
  Administered 2016-01-11: 50 mg via ORAL
  Filled 2016-01-11: qty 1

## 2016-01-11 MED ORDER — GLIPIZIDE 5 MG PO TABS
10.0000 mg | ORAL_TABLET | Freq: Every day | ORAL | Status: DC
  Administered 2016-01-11 – 2016-01-17 (×6): 10 mg via ORAL
  Filled 2016-01-11 (×6): qty 2

## 2016-01-11 MED ORDER — AMLODIPINE BESYLATE 5 MG PO TABS
7.5000 mg | ORAL_TABLET | Freq: Every day | ORAL | Status: DC
  Administered 2016-01-11 – 2016-01-17 (×6): 7.5 mg via ORAL
  Filled 2016-01-11 (×6): qty 1

## 2016-01-11 MED ORDER — INSULIN ASPART 100 UNIT/ML ~~LOC~~ SOLN
0.0000 [IU] | Freq: Every day | SUBCUTANEOUS | Status: DC
  Administered 2016-01-12: 2 [IU] via SUBCUTANEOUS
  Administered 2016-01-13: 3 [IU] via SUBCUTANEOUS
  Administered 2016-01-16: 5 [IU] via SUBCUTANEOUS

## 2016-01-11 MED ORDER — GLIPIZIDE 5 MG PO TABS: 5.0000 mg | ORAL_TABLET | ORAL | Status: DC

## 2016-01-11 MED ORDER — CALCIUM GLUCONATE 500 MG PO TABS
2.0000 | ORAL_TABLET | Freq: Three times a day (TID) | ORAL | Status: DC
  Filled 2016-01-11: qty 2

## 2016-01-11 MED ORDER — ONDANSETRON HCL 4 MG/2ML IJ SOLN
4.0000 mg | Freq: Four times a day (QID) | INTRAMUSCULAR | Status: DC | PRN
Start: 2016-01-11 — End: 2016-01-17
  Administered 2016-01-11: 4 mg via INTRAVENOUS
  Filled 2016-01-11: qty 2

## 2016-01-11 MED ORDER — FLUTICASONE PROPIONATE 50 MCG/ACT NA SUSP
2.0000 | Freq: Every day | NASAL | Status: DC
  Administered 2016-01-17: 2 via NASAL
  Filled 2016-01-11: qty 16

## 2016-01-11 MED ORDER — INSULIN ASPART 100 UNIT/ML ~~LOC~~ SOLN
0.0000 [IU] | Freq: Three times a day (TID) | SUBCUTANEOUS | Status: DC
  Administered 2016-01-11: 15 [IU] via SUBCUTANEOUS
  Administered 2016-01-11: 8 [IU] via SUBCUTANEOUS
  Administered 2016-01-11: 15 [IU] via SUBCUTANEOUS
  Administered 2016-01-12 (×3): 3 [IU] via SUBCUTANEOUS
  Administered 2016-01-13 (×2): 5 [IU] via SUBCUTANEOUS
  Administered 2016-01-13: 3 [IU] via SUBCUTANEOUS
  Administered 2016-01-14: 5 [IU] via SUBCUTANEOUS
  Administered 2016-01-14: 8 [IU] via SUBCUTANEOUS
  Administered 2016-01-14: 5 [IU] via SUBCUTANEOUS
  Administered 2016-01-15: 8 [IU] via SUBCUTANEOUS
  Administered 2016-01-15: 3 [IU] via SUBCUTANEOUS
  Administered 2016-01-15: 2 [IU] via SUBCUTANEOUS
  Administered 2016-01-16: 8 [IU] via SUBCUTANEOUS
  Administered 2016-01-16: 11 [IU] via SUBCUTANEOUS
  Administered 2016-01-17: 5 [IU] via SUBCUTANEOUS

## 2016-01-11 MED ORDER — PANTOPRAZOLE SODIUM 40 MG PO TBEC
40.0000 mg | DELAYED_RELEASE_TABLET | Freq: Two times a day (BID) | ORAL | Status: DC
  Administered 2016-01-11 – 2016-01-17 (×12): 40 mg via ORAL
  Filled 2016-01-11 (×12): qty 1

## 2016-01-11 MED ORDER — ATORVASTATIN CALCIUM 80 MG PO TABS
80.0000 mg | ORAL_TABLET | Freq: Every day | ORAL | Status: DC
  Administered 2016-01-11 – 2016-01-17 (×6): 80 mg via ORAL
  Filled 2016-01-11 (×6): qty 1

## 2016-01-11 MED ORDER — GABAPENTIN 300 MG PO CAPS
300.0000 mg | ORAL_CAPSULE | Freq: Three times a day (TID) | ORAL | Status: DC
  Administered 2016-01-11 – 2016-01-17 (×19): 300 mg via ORAL
  Filled 2016-01-11 (×19): qty 1

## 2016-01-11 MED ORDER — METFORMIN HCL 500 MG PO TABS: 1000.0000 mg | ORAL_TABLET | Freq: Two times a day (BID) | ORAL | Status: DC

## 2016-01-11 MED ORDER — FAMOTIDINE IN NACL 20-0.9 MG/50ML-% IV SOLN
20.0000 mg | Freq: Two times a day (BID) | INTRAVENOUS | Status: DC
  Filled 2016-01-11 (×3): qty 50

## 2016-01-11 MED ORDER — INSULIN ASPART 100 UNIT/ML ~~LOC~~ SOLN
4.0000 [IU] | Freq: Three times a day (TID) | SUBCUTANEOUS | Status: DC
  Administered 2016-01-11 – 2016-01-17 (×16): 4 [IU] via SUBCUTANEOUS

## 2016-01-11 NOTE — Progress Notes (Signed)
Larger drainage bag switched out on suprapubic catheter as recommended per urologist. Will continue to monitor.

## 2016-01-11 NOTE — Consult Note (Signed)
New Urology Consult Note   Requesting Attending Physician:  Trauma Md, MD Service Providing Consult: Urology Consulting Attending: Matilde Sprang, MD  I performed a history and physical examination of the patient and discussed his management with the resident.  I reviewed the resident's note and agree with the documented findings and plan of care. S/P tube excellent position. Spent many minutes with diagram explaining injury and post op course      Assessment:  Patient is a 75 y.o. male with history of DM, HTN, HLD presents with urethral injury s/p crush injury on 01/10/16 at ~4pm after tractor accident. CT A/P shows large extraperitoneal clot anterior to the bladder in the space of Retzius and RUG showed abrupt cessation of contrast opacification at the proximal bulbar urethra. Bedside cystoscopy by urology unsuccessful due to seemingly significant urethral injury near the membranous urethra. Known pelvic fractures secondary to trauma as well. S/p SPT placement with IR with 12Fr SPT on evening of 9/29.   Interval: S/p SPT last evening with Ir. CT imaging from that procedure does not show any bladder injury, SPT draining yellow urine. AF overnight. HR in 100-110s. Hypotensive. 400cc UOP recorded with another >500cc in bag this morning. Cr 2.0 from 1.2. Hyperkalemic.   Recommendations: 1. No further imaging required from urologic perspective acutely. IR SPT placement clearly defines bladder anatomy, no further need for cystogram 2. Continue SPT to straight drain. Recommended to RN staff to switch to larger standing gravity bag this morning and be sure to have below the level of the bladder for adequate drainage 3. Do not place anything via urethra 4. Suprapubic catheter will need to be exchanged q4 weeks and with IR at least for first few exchanges. Should try to upsize catheter size with each exchange 5. Long term urologic plan will be follow up as outpatient in 4-6 weeks with consideration for GA  cystoscopy, RUG, attempt at Foley placement if amenable +/- antegrade flex cystoscopy via SPT with antegrade urethrogram as well  6. Continue to trend Cr   Thank you for this consult. Please contact the urology consult pager with any further questions/concerns. Sharmaine Base, MD Urology Surgical Resident  Subjective: Pain still present in right hip. Tolerated SPT placement very well. Asking appropriate questions regarding urologic plan.  Objective   Vital signs in last 24 hours: BP (!) 95/54 (BP Location: Right Arm)   Pulse (!) 115   Temp 97.5 F (36.4 C) (Oral)   Resp (!) 22   Ht 6\' 2"  (1.88 m)   Wt 222 lb (100.7 kg)   SpO2 97%   BMI 28.50 kg/m   Intake/Output last 3 shifts: I/O last 3 completed shifts: In: 3000 [I.V.:3000] Out: 18 [Urine:550; Emesis/NG output:40]  Physical Exam General: appears comfortable, A&O, resting, elderly male HEENT: [place]/AT, EOMI, MMM Pulmonary: Normal work of breathing on RA Cardiovascular: Tachycardic, adequate peripheral perfusion Abdomen: soft, ND, TTP, no flank ecchymosis, left flank abrasions noted, suprapubic in place (12Fr) without significant surrounding erythema GU: uncircumcised male phallus, no visible blood via urethral meatus, bilateral descended testicles, no perineal ecchymosis, SPT draining light yellow urine without hematuria Extremities: warm and well perfused, no edema  Most Recent Labs: Lab Results  Component Value Date   WBC 15.1 (H) 01/11/2016   HGB 8.2 (L) 01/11/2016   HCT 25.7 (L) 01/11/2016   PLT 171 01/11/2016    Lab Results  Component Value Date   NA 135 01/11/2016   K 5.2 (H) 01/11/2016   CL 110 01/11/2016  CO2 16 (L) 01/11/2016   BUN 30 (H) 01/11/2016   CREATININE 2.29 (H) 01/11/2016   CALCIUM 7.2 (L) 01/11/2016    Lab Results  Component Value Date   ALKPHOS 43 01/11/2016   BILITOT 0.6 01/11/2016   PROT 4.8 (L) 01/11/2016   ALBUMIN 2.9 (L) 01/11/2016   ALT 33 01/11/2016   AST 46 (H) 01/11/2016     Lab Results  Component Value Date   INR 1.28 01/11/2016   APTT 30 01/11/2016     Urine Culture: None   IMAGING: Ct Chest W Contrast  Result Date: 01/10/2016 CLINICAL DATA:  Level 1 trauma, bleeding at the meatus of the penis. EXAM: CT CHEST WITH CONTRAST CT ABDOMEN AND PELVIS WITH AND WITHOUT CONTRAST TECHNIQUE: Multidetector CT imaging of the chest was performed during intravenous contrast administration. Multidetector CT imaging of the abdomen and pelvis was performed following the standard protocol before and during bolus administration of intravenous contrast. CONTRAST:  117mL ISOVUE-300 IOPAMIDOL (ISOVUE-300) INJECTION 61% COMPARISON:  12/18/2015 CT abdomen and pelvis FINDINGS: CT CHEST FINDINGS Cardiovascular: Normal size cardiac chambers. No pericardial effusion. Minimal coronary arteriosclerosis. Mediastinum/Nodes: No mediastinal hematoma. Normal branching pattern of the great vessels. Slightly ectatic ascending thoracic aorta measuring up to 3.6 cm at the level of the main pulmonary artery. No dissection of the thoracic aorta. Lungs/Pleura: No effusion or pneumothorax. Bibasilar atelectasis. Pulmonary contusion nor consolidation. Musculoskeletal: Old right eighth rib fracture with subtle deformity. No acute displaced fracture of the bony thorax. Osteoarthritis of the sternoclavicular joints. CT ABDOMEN AND PELVIS FINDINGS Hepatobiliary: Slightly hypodense appearance of the liver which may reflect fatty infiltration. No space-occupying mass. The left lobe is seen draped over the normal size spleen. Pancreas: Unremarkable. No pancreatic ductal dilatation or surrounding inflammatory changes. Spleen: No splenic injury or perisplenic hematoma. Small splenule measuring 7 mm Adrenals/Urinary Tract: Bilateral renal cysts are identified the largest is interpolar on the right measuring 1.6 cm and on the left, involving the lower pole measuring 1.9 cm. Bilateral punctate calcifications are  identified the largest is on the left measuring 2 mm. The bladder is contracted in appearance. Overlying the bladder anteriorly in the space of Retzius and extending cephalad is heterogeneous isodense to hyperdense fluid in keeping with hemorrhage and clot. This measures approximately 12.9 cm craniocaudad by 3 cm AP x 12.4 cm transverse. Delayed images were repeated through the bladder demonstrating contrast opacified urine along an intact posterior bladder wall. The anterior wall where the adjacent hemorrhage however is seen cannot be definitely cleared for tear/rupture. Stomach/Bowel: Stomach is within normal limits. Appendix appears normal. No evidence of bowel wall thickening, distention, or inflammatory changes. Vascular/Lymphatic: Aortic atherosclerosis. No enlarged abdominal or pelvic lymph nodes. Reproductive: Prostate is unremarkable. Other: Retroperitoneal hematoma with a asymmetric enlargement of the right psoas muscle suggesting an intramuscular component to hematoma. Overlying ill-defined free fluid is noted abutting the right psoas muscle. Soft tissue induration of the right gluteal soft tissues likely representing contusion. Musculoskeletal: There are acute fractures of the bony pelvis which are as follows: 1. Sagittal right sacral alar fracture extending across the right sacroiliac joint and involving the right iliac bone. This appears to spare the sacral neural foramina. 2. Bilateral parasymphyseal fractures extending into the pubic symphysis posteriorly. 3.  Right inferior pubic ramus fracture with minimal displacement. There is lower lumbar degenerative disc disease at L4-5 and L5-S1. Multilevel facet hypertrophy and sclerosis most prominently at L5-S1 causing moderate to marked neural foraminal stenosis. No acute osseous abnormality of the  dorsal spine. IMPRESSION: 1. Acute fractures of the right sacral ala extending across the right sacroiliac joint and involving the posterior right iliac bone.  No significant displacement. 2. Acute bilateral parasymphyseal fractures with fracture lucencies extending into the pubic symphysis. 3. Acute minimally displaced right inferior pubic ramus fracture. 4. Hemorrhage and clot interposed between the pubic symphysis and bladder extending cephalad in the space of Retzius, measuring approximately 12.9 x 3 x 12.4 cm. No acute chest abnormalities. These results were called by telephone at the time of interpretation on 01/10/2016 at 7:20 pm to Dr. Gershon Crane , who verbally acknowledged these results. Electronically Signed   By: Ashley Royalty M.D.   On: 01/10/2016 19:35   Ct Abdomen Pelvis W Contrast  Result Date: 01/10/2016 CLINICAL DATA:  Level 1 trauma, bleeding at the meatus of the penis. EXAM: CT CHEST WITH CONTRAST CT ABDOMEN AND PELVIS WITH AND WITHOUT CONTRAST TECHNIQUE: Multidetector CT imaging of the chest was performed during intravenous contrast administration. Multidetector CT imaging of the abdomen and pelvis was performed following the standard protocol before and during bolus administration of intravenous contrast. CONTRAST:  137mL ISOVUE-300 IOPAMIDOL (ISOVUE-300) INJECTION 61% COMPARISON:  12/18/2015 CT abdomen and pelvis FINDINGS: CT CHEST FINDINGS Cardiovascular: Normal size cardiac chambers. No pericardial effusion. Minimal coronary arteriosclerosis. Mediastinum/Nodes: No mediastinal hematoma. Normal branching pattern of the great vessels. Slightly ectatic ascending thoracic aorta measuring up to 3.6 cm at the level of the main pulmonary artery. No dissection of the thoracic aorta. Lungs/Pleura: No effusion or pneumothorax. Bibasilar atelectasis. Pulmonary contusion nor consolidation. Musculoskeletal: Old right eighth rib fracture with subtle deformity. No acute displaced fracture of the bony thorax. Osteoarthritis of the sternoclavicular joints. CT ABDOMEN AND PELVIS FINDINGS Hepatobiliary: Slightly hypodense appearance of the liver which may reflect fatty  infiltration. No space-occupying mass. The left lobe is seen draped over the normal size spleen. Pancreas: Unremarkable. No pancreatic ductal dilatation or surrounding inflammatory changes. Spleen: No splenic injury or perisplenic hematoma. Small splenule measuring 7 mm Adrenals/Urinary Tract: Bilateral renal cysts are identified the largest is interpolar on the right measuring 1.6 cm and on the left, involving the lower pole measuring 1.9 cm. Bilateral punctate calcifications are identified the largest is on the left measuring 2 mm. The bladder is contracted in appearance. Overlying the bladder anteriorly in the space of Retzius and extending cephalad is heterogeneous isodense to hyperdense fluid in keeping with hemorrhage and clot. This measures approximately 12.9 cm craniocaudad by 3 cm AP x 12.4 cm transverse. Delayed images were repeated through the bladder demonstrating contrast opacified urine along an intact posterior bladder wall. The anterior wall where the adjacent hemorrhage however is seen cannot be definitely cleared for tear/rupture. Stomach/Bowel: Stomach is within normal limits. Appendix appears normal. No evidence of bowel wall thickening, distention, or inflammatory changes. Vascular/Lymphatic: Aortic atherosclerosis. No enlarged abdominal or pelvic lymph nodes. Reproductive: Prostate is unremarkable. Other: Retroperitoneal hematoma with a asymmetric enlargement of the right psoas muscle suggesting an intramuscular component to hematoma. Overlying ill-defined free fluid is noted abutting the right psoas muscle. Soft tissue induration of the right gluteal soft tissues likely representing contusion. Musculoskeletal: There are acute fractures of the bony pelvis which are as follows: 1. Sagittal right sacral alar fracture extending across the right sacroiliac joint and involving the right iliac bone. This appears to spare the sacral neural foramina. 2. Bilateral parasymphyseal fractures extending into  the pubic symphysis posteriorly. 3.  Right inferior pubic ramus fracture with minimal displacement. There is lower lumbar  degenerative disc disease at L4-5 and L5-S1. Multilevel facet hypertrophy and sclerosis most prominently at L5-S1 causing moderate to marked neural foraminal stenosis. No acute osseous abnormality of the dorsal spine. IMPRESSION: 1. Acute fractures of the right sacral ala extending across the right sacroiliac joint and involving the posterior right iliac bone. No significant displacement. 2. Acute bilateral parasymphyseal fractures with fracture lucencies extending into the pubic symphysis. 3. Acute minimally displaced right inferior pubic ramus fracture. 4. Hemorrhage and clot interposed between the pubic symphysis and bladder extending cephalad in the space of Retzius, measuring approximately 12.9 x 3 x 12.4 cm. No acute chest abnormalities. These results were called by telephone at the time of interpretation on 01/10/2016 at 7:20 pm to Dr. Gershon Crane , who verbally acknowledged these results. Electronically Signed   By: Ashley Royalty M.D.   On: 01/10/2016 19:35   Dg Retrograde-urethrogram  Result Date: 01/10/2016 CLINICAL DATA:  Tractor accident.  Pelvic fractures. EXAM: RETROGRADE URETHROGRAPHY TECHNIQUE: Following catheterization of the urethral meatus water-soluble contrast material was injected in a retrograde fashion into the urethra. CONTRAST:  30 cc Conrad Cysto FLUOROSCOPY TIME:  Radiation Exposure Index (as provided by the fluoroscopic device): If the device does not provide the exposure index: Fluoroscopy Time (in minutes and seconds):  1 minutes 20 seconds Number of Acquired Images:  13 COMPARISON:  CT 01/10/2016 FINDINGS: At the outset of the examination there was already contrast material within the urinary bladder from the CT of the abdomen and pelvis performed earlier. Retrograde injection of the Foley catheter resultant in Opacification of the distal and mid urethra to the level of  the bulbous urethra. No passage of contrast material beyond the level of the bulbous urethra identified. Venous intravasation was visualized at the level of the bulbous urethra indicating adequate injection pressures. No extravasation of contrast material from the urethra identified . IMPRESSION: Imaging findings compatible with an obstructed urethrogram at the junction between the bulbous urethra and membranous urethra. No extravasation of contrast material from the urethra identified. These results were called by telephone at the time of interpretation on 01/10/2016 at 8:48 pm to Dr. Dayna Barker , who verbally acknowledged these results. Electronically Signed   By: Kerby Moors M.D.   On: 01/10/2016 20:51   Ct Image Guided Drainage By Percutaneous Catheter  Result Date: 01/11/2016 INDICATION: Tractor injury, now with pelvic fractures and traumatic laceration of the urethra. Failed Foley catheter placement. Request made for image guided placement of a suprapubic catheter. EXAM: ULTRASOUND AND CT-GUIDED SUPRAPUBIC CATHETER PLACEMENT COMPARISON:  CT the abdomen pelvis - 01/10/2016; retrograde urethrogram -01/10/2016 MEDICATIONS: None ANESTHESIA/SEDATION: Moderate (conscious) sedation was employed during this procedure. A total of Versed 1 mg and Fentanyl 50 mcg was administered intravenously. Moderate Sedation Time: 20 minutes. The patient's level of consciousness and vital signs were monitored continuously by radiology nursing throughout the procedure under my direct supervision. CONTRAST:  None FLUOROSCOPY TIME:  None COMPLICATIONS: None immediate. PROCEDURE: The procedure, risks, benefits, and alternatives were explained to the patient. Questions regarding the procedure were encouraged and answered. The patient understands and consents to the procedure. A timeout was performed prior to the initiation of the procedure. The patient was positioned supine on the CT gantry. Preprocedural imaging was obtained of the  lower pelvis. The skin overlying the anterior aspect the lower pelvis was prepped and draped in usual sterile fashion. Appropriately planning was performed with the use of a 22 gauge spinal needle. Under direct ultrasound guidance, the inferior anterior aspect of  the urinary bladder was accessed with an 18 gauge trocar needle. Appropriate positioning was confirmed with the efflux of urine. A short Amplatz wire was advanced through the trocar needle ankle within the urinary bladder. Appropriate positioning was confirmed with CT imaging. The track was dilated allowing placement of a 12 French percutaneous drainage catheter with and coiled and locked within the urinary bladder. The catheter was connected to a gravity bag healing the brisk return of normal urine. No significant hematuria. The exit site of the catheter was secured within interrupted suture. A dressing was placed. The patient tolerated the procedure well without immediate postprocedural complication. FINDINGS: CT imaging demonstrates distention of the urinary bladder. There is a moderate amount of excreted contrast seen within the urinary bladder. Demonstrated multiple pelvic fractures with a hematoma about the anterior inferior aspect of the lower abdomen/pelvis, similar to preceding abdominal CT. There is no bowel interposed between the anterior aspect of the urinary bladder and the abdominal wall. After percutaneous drainage catheter placement, the 12 French percutaneous drain is coiled and locked within the urinary bladder. IMPRESSION: Technically successful ultrasound and CT-guided placement of a suprapubic catheter. Electronically Signed   By: Sandi Mariscal M.D.   On: 01/11/2016 08:47

## 2016-01-11 NOTE — Progress Notes (Signed)
Subjective: Pt with pelvic pain    Controlled on meds   Objective: Vital signs in last 24 hours: Temp:  [97.7 F (36.5 C)-98.5 F (36.9 C)] 98.4 F (36.9 C) (09/30 0714) Pulse Rate:  [35-115] 106 (09/30 0714) Resp:  [9-25] 20 (09/30 0714) BP: (86-132)/(43-89) 98/59 (09/30 0714) SpO2:  [61 %-100 %] 98 % (09/30 0714) Weight:  [100.7 kg (222 lb)] 100.7 kg (222 lb) (09/29 1648)    Intake/Output from previous day: 09/29 0701 - 09/30 0700 In: 3000 [I.V.:3000] Out: 590 [Urine:550; Emesis/NG output:40] Intake/Output this shift: Total I/O In: -  Out: 100 [Urine:100]  Resp: clear to auscultation bilaterally Cardio: regular rate and rhythm, S1, S2 normal, no murmur, click, rub or gallop GI: abnormal findings:  SPT in place draining clear urine tender pelvis  No rebound or guarding   Ext  No CCE  Lab Results:   Recent Labs  01/10/16 1747  01/10/16 2152 01/11/16 0502  WBC 22.6*  --   --  15.1*  HGB 10.8*  < > 8.8* 8.2*  HCT 32.8*  < > 26.0* 25.7*  PLT 194  --   --  171  < > = values in this interval not displayed. BMET  Recent Labs  01/10/16 1747  01/10/16 2152 01/11/16 0502  NA 135  < > 140 138  K 4.2  < > 4.9 7.0*  CL 106  < > 108 111  CO2 16*  --   --  10*  GLUCOSE 328*  < > 373* 424*  BUN 19  < > 21* 24*  CREATININE 1.44*  < > 1.20 2.07*  CALCIUM 8.2*  --   --  7.4*  < > = values in this interval not displayed. PT/INR  Recent Labs  01/11/16 0502  LABPROT 16.1*  INR 1.28   ABG No results for input(s): PHART, HCO3 in the last 72 hours.  Invalid input(s): PCO2, PO2  Studies/Results: Ct Chest W Contrast  Result Date: 01/10/2016 CLINICAL DATA:  Level 1 trauma, bleeding at the meatus of the penis. EXAM: CT CHEST WITH CONTRAST CT ABDOMEN AND PELVIS WITH AND WITHOUT CONTRAST TECHNIQUE: Multidetector CT imaging of the chest was performed during intravenous contrast administration. Multidetector CT imaging of the abdomen and pelvis was performed  following the standard protocol before and during bolus administration of intravenous contrast. CONTRAST:  134mL ISOVUE-300 IOPAMIDOL (ISOVUE-300) INJECTION 61% COMPARISON:  12/18/2015 CT abdomen and pelvis FINDINGS: CT CHEST FINDINGS Cardiovascular: Normal size cardiac chambers. No pericardial effusion. Minimal coronary arteriosclerosis. Mediastinum/Nodes: No mediastinal hematoma. Normal branching pattern of the great vessels. Slightly ectatic ascending thoracic aorta measuring up to 3.6 cm at the level of the main pulmonary artery. No dissection of the thoracic aorta. Lungs/Pleura: No effusion or pneumothorax. Bibasilar atelectasis. Pulmonary contusion nor consolidation. Musculoskeletal: Old right eighth rib fracture with subtle deformity. No acute displaced fracture of the bony thorax. Osteoarthritis of the sternoclavicular joints. CT ABDOMEN AND PELVIS FINDINGS Hepatobiliary: Slightly hypodense appearance of the liver which may reflect fatty infiltration. No space-occupying mass. The left lobe is seen draped over the normal size spleen. Pancreas: Unremarkable. No pancreatic ductal dilatation or surrounding inflammatory changes. Spleen: No splenic injury or perisplenic hematoma. Small splenule measuring 7 mm Adrenals/Urinary Tract: Bilateral renal cysts are identified the largest is interpolar on the right measuring 1.6 cm and on the left, involving the lower pole measuring 1.9 cm. Bilateral punctate calcifications are identified the largest is on the left measuring 2 mm. The bladder is contracted  in appearance. Overlying the bladder anteriorly in the space of Retzius and extending cephalad is heterogeneous isodense to hyperdense fluid in keeping with hemorrhage and clot. This measures approximately 12.9 cm craniocaudad by 3 cm AP x 12.4 cm transverse. Delayed images were repeated through the bladder demonstrating contrast opacified urine along an intact posterior bladder wall. The anterior wall where the  adjacent hemorrhage however is seen cannot be definitely cleared for tear/rupture. Stomach/Bowel: Stomach is within normal limits. Appendix appears normal. No evidence of bowel wall thickening, distention, or inflammatory changes. Vascular/Lymphatic: Aortic atherosclerosis. No enlarged abdominal or pelvic lymph nodes. Reproductive: Prostate is unremarkable. Other: Retroperitoneal hematoma with a asymmetric enlargement of the right psoas muscle suggesting an intramuscular component to hematoma. Overlying ill-defined free fluid is noted abutting the right psoas muscle. Soft tissue induration of the right gluteal soft tissues likely representing contusion. Musculoskeletal: There are acute fractures of the bony pelvis which are as follows: 1. Sagittal right sacral alar fracture extending across the right sacroiliac joint and involving the right iliac bone. This appears to spare the sacral neural foramina. 2. Bilateral parasymphyseal fractures extending into the pubic symphysis posteriorly. 3.  Right inferior pubic ramus fracture with minimal displacement. There is lower lumbar degenerative disc disease at L4-5 and L5-S1. Multilevel facet hypertrophy and sclerosis most prominently at L5-S1 causing moderate to marked neural foraminal stenosis. No acute osseous abnormality of the dorsal spine. IMPRESSION: 1. Acute fractures of the right sacral ala extending across the right sacroiliac joint and involving the posterior right iliac bone. No significant displacement. 2. Acute bilateral parasymphyseal fractures with fracture lucencies extending into the pubic symphysis. 3. Acute minimally displaced right inferior pubic ramus fracture. 4. Hemorrhage and clot interposed between the pubic symphysis and bladder extending cephalad in the space of Retzius, measuring approximately 12.9 x 3 x 12.4 cm. No acute chest abnormalities. These results were called by telephone at the time of interpretation on 01/10/2016 at 7:20 pm to Dr. Gershon Crane  , who verbally acknowledged these results. Electronically Signed   By: Ashley Royalty M.D.   On: 01/10/2016 19:35   Ct Abdomen Pelvis W Contrast  Result Date: 01/10/2016 CLINICAL DATA:  Level 1 trauma, bleeding at the meatus of the penis. EXAM: CT CHEST WITH CONTRAST CT ABDOMEN AND PELVIS WITH AND WITHOUT CONTRAST TECHNIQUE: Multidetector CT imaging of the chest was performed during intravenous contrast administration. Multidetector CT imaging of the abdomen and pelvis was performed following the standard protocol before and during bolus administration of intravenous contrast. CONTRAST:  170mL ISOVUE-300 IOPAMIDOL (ISOVUE-300) INJECTION 61% COMPARISON:  12/18/2015 CT abdomen and pelvis FINDINGS: CT CHEST FINDINGS Cardiovascular: Normal size cardiac chambers. No pericardial effusion. Minimal coronary arteriosclerosis. Mediastinum/Nodes: No mediastinal hematoma. Normal branching pattern of the great vessels. Slightly ectatic ascending thoracic aorta measuring up to 3.6 cm at the level of the main pulmonary artery. No dissection of the thoracic aorta. Lungs/Pleura: No effusion or pneumothorax. Bibasilar atelectasis. Pulmonary contusion nor consolidation. Musculoskeletal: Old right eighth rib fracture with subtle deformity. No acute displaced fracture of the bony thorax. Osteoarthritis of the sternoclavicular joints. CT ABDOMEN AND PELVIS FINDINGS Hepatobiliary: Slightly hypodense appearance of the liver which may reflect fatty infiltration. No space-occupying mass. The left lobe is seen draped over the normal size spleen. Pancreas: Unremarkable. No pancreatic ductal dilatation or surrounding inflammatory changes. Spleen: No splenic injury or perisplenic hematoma. Small splenule measuring 7 mm Adrenals/Urinary Tract: Bilateral renal cysts are identified the largest is interpolar on the right measuring 1.6 cm and  on the left, involving the lower pole measuring 1.9 cm. Bilateral punctate calcifications are identified the  largest is on the left measuring 2 mm. The bladder is contracted in appearance. Overlying the bladder anteriorly in the space of Retzius and extending cephalad is heterogeneous isodense to hyperdense fluid in keeping with hemorrhage and clot. This measures approximately 12.9 cm craniocaudad by 3 cm AP x 12.4 cm transverse. Delayed images were repeated through the bladder demonstrating contrast opacified urine along an intact posterior bladder wall. The anterior wall where the adjacent hemorrhage however is seen cannot be definitely cleared for tear/rupture. Stomach/Bowel: Stomach is within normal limits. Appendix appears normal. No evidence of bowel wall thickening, distention, or inflammatory changes. Vascular/Lymphatic: Aortic atherosclerosis. No enlarged abdominal or pelvic lymph nodes. Reproductive: Prostate is unremarkable. Other: Retroperitoneal hematoma with a asymmetric enlargement of the right psoas muscle suggesting an intramuscular component to hematoma. Overlying ill-defined free fluid is noted abutting the right psoas muscle. Soft tissue induration of the right gluteal soft tissues likely representing contusion. Musculoskeletal: There are acute fractures of the bony pelvis which are as follows: 1. Sagittal right sacral alar fracture extending across the right sacroiliac joint and involving the right iliac bone. This appears to spare the sacral neural foramina. 2. Bilateral parasymphyseal fractures extending into the pubic symphysis posteriorly. 3.  Right inferior pubic ramus fracture with minimal displacement. There is lower lumbar degenerative disc disease at L4-5 and L5-S1. Multilevel facet hypertrophy and sclerosis most prominently at L5-S1 causing moderate to marked neural foraminal stenosis. No acute osseous abnormality of the dorsal spine. IMPRESSION: 1. Acute fractures of the right sacral ala extending across the right sacroiliac joint and involving the posterior right iliac bone. No significant  displacement. 2. Acute bilateral parasymphyseal fractures with fracture lucencies extending into the pubic symphysis. 3. Acute minimally displaced right inferior pubic ramus fracture. 4. Hemorrhage and clot interposed between the pubic symphysis and bladder extending cephalad in the space of Retzius, measuring approximately 12.9 x 3 x 12.4 cm. No acute chest abnormalities. These results were called by telephone at the time of interpretation on 01/10/2016 at 7:20 pm to Dr. Gershon Crane , who verbally acknowledged these results. Electronically Signed   By: Ashley Royalty M.D.   On: 01/10/2016 19:35   Dg Retrograde-urethrogram  Result Date: 01/10/2016 CLINICAL DATA:  Tractor accident.  Pelvic fractures. EXAM: RETROGRADE URETHROGRAPHY TECHNIQUE: Following catheterization of the urethral meatus water-soluble contrast material was injected in a retrograde fashion into the urethra. CONTRAST:  30 cc Conrad Cysto FLUOROSCOPY TIME:  Radiation Exposure Index (as provided by the fluoroscopic device): If the device does not provide the exposure index: Fluoroscopy Time (in minutes and seconds):  1 minutes 20 seconds Number of Acquired Images:  13 COMPARISON:  CT 01/10/2016 FINDINGS: At the outset of the examination there was already contrast material within the urinary bladder from the CT of the abdomen and pelvis performed earlier. Retrograde injection of the Foley catheter resultant in Opacification of the distal and mid urethra to the level of the bulbous urethra. No passage of contrast material beyond the level of the bulbous urethra identified. Venous intravasation was visualized at the level of the bulbous urethra indicating adequate injection pressures. No extravasation of contrast material from the urethra identified . IMPRESSION: Imaging findings compatible with an obstructed urethrogram at the junction between the bulbous urethra and membranous urethra. No extravasation of contrast material from the urethra identified. These  results were called by telephone at the time of interpretation on 01/10/2016  at 8:48 pm to Dr. Dayna Barker , who verbally acknowledged these results. Electronically Signed   By: Kerby Moors M.D.   On: 01/10/2016 20:51   Ct Image Guided Drainage By Percutaneous Catheter  Result Date: 01/11/2016 INDICATION: Tractor injury, now with pelvic fractures and traumatic laceration of the urethra. Failed Foley catheter placement. Request made for image guided placement of a suprapubic catheter. EXAM: ULTRASOUND AND CT-GUIDED SUPRAPUBIC CATHETER PLACEMENT COMPARISON:  CT the abdomen pelvis - 01/10/2016; retrograde urethrogram -01/10/2016 MEDICATIONS: None ANESTHESIA/SEDATION: Moderate (conscious) sedation was employed during this procedure. A total of Versed 1 mg and Fentanyl 50 mcg was administered intravenously. Moderate Sedation Time: 20 minutes. The patient's level of consciousness and vital signs were monitored continuously by radiology nursing throughout the procedure under my direct supervision. CONTRAST:  None FLUOROSCOPY TIME:  None COMPLICATIONS: None immediate. PROCEDURE: The procedure, risks, benefits, and alternatives were explained to the patient. Questions regarding the procedure were encouraged and answered. The patient understands and consents to the procedure. A timeout was performed prior to the initiation of the procedure. The patient was positioned supine on the CT gantry. Preprocedural imaging was obtained of the lower pelvis. The skin overlying the anterior aspect the lower pelvis was prepped and draped in usual sterile fashion. Appropriately planning was performed with the use of a 22 gauge spinal needle. Under direct ultrasound guidance, the inferior anterior aspect of the urinary bladder was accessed with an 18 gauge trocar needle. Appropriate positioning was confirmed with the efflux of urine. A short Amplatz wire was advanced through the trocar needle ankle within the urinary bladder. Appropriate  positioning was confirmed with CT imaging. The track was dilated allowing placement of a 12 French percutaneous drainage catheter with and coiled and locked within the urinary bladder. The catheter was connected to a gravity bag healing the brisk return of normal urine. No significant hematuria. The exit site of the catheter was secured within interrupted suture. A dressing was placed. The patient tolerated the procedure well without immediate postprocedural complication. FINDINGS: CT imaging demonstrates distention of the urinary bladder. There is a moderate amount of excreted contrast seen within the urinary bladder. Demonstrated multiple pelvic fractures with a hematoma about the anterior inferior aspect of the lower abdomen/pelvis, similar to preceding abdominal CT. There is no bowel interposed between the anterior aspect of the urinary bladder and the abdominal wall. After percutaneous drainage catheter placement, the 12 French percutaneous drain is coiled and locked within the urinary bladder. IMPRESSION: Technically successful ultrasound and CT-guided placement of a suprapubic catheter. Electronically Signed   By: Sandi Mariscal M.D.   On: 01/11/2016 08:47    Anti-infectives: Anti-infectives    None      Assessment/Plan: Patient Active Problem List   Diagnosis Date Noted  . Pelvic fracture ([place]) 01/10/2016  Urethral injury- SPT in place urology following    Pelvic fx  Per ortho   Touchdown  WB   Hyperkalemia   Lasix/IVF/Kayexylate  Recheck BMET later  Urine clear at this point  Rhabdomyolysis from crush injury- treat with IVF/lasix/urine decompression/  kayexylate   Keep in SDU to monitor EKG   Discussed with the family    LOS: 1 day    Christian [name] A. 01/11/2016

## 2016-01-11 NOTE — Progress Notes (Signed)
CRITICAL VALUE ALERT  Critical value received:  K+ 7.0  Date of notification:  01/11/2016  Time of notification: 0635 from [employer] value read back: Yes  Nurse who received alert:  Kenney Houseman RN  MD notified (1st page):  [place]  Time of first page:  [phone]  MD notified (2nd page): Grandville Silos  Time of second page: 0721  Responding MD:  Grandville Silos  Time MD responded:  [place] for Kayexalate and Calcium gluconate

## 2016-01-11 NOTE — Progress Notes (Signed)
Suprapubic cath flushed with 5cc at 0550. Incision cleaned and split guaze applied.

## 2016-01-12 LAB — COMPREHENSIVE METABOLIC PANEL WITH GFR
ALT: 26 U/L (ref 17–63)
AST: 41 U/L (ref 15–41)
Albumin: 2.6 g/dL — ABNORMAL LOW (ref 3.5–5.0)
Alkaline Phosphatase: 43 U/L (ref 38–126)
Anion gap: 7 (ref 5–15)
BUN: 28 mg/dL — ABNORMAL HIGH (ref 6–20)
CO2: 19 mmol/L — ABNORMAL LOW (ref 22–32)
Calcium: 7.6 mg/dL — ABNORMAL LOW (ref 8.9–10.3)
Chloride: 113 mmol/L — ABNORMAL HIGH (ref 101–111)
Creatinine, Ser: 1.57 mg/dL — ABNORMAL HIGH (ref 0.61–1.24)
GFR calc Af Amer: 48 mL/min — ABNORMAL LOW
GFR calc non Af Amer: 42 mL/min — ABNORMAL LOW
Glucose, Bld: 197 mg/dL — ABNORMAL HIGH (ref 65–99)
Potassium: 4.7 mmol/L (ref 3.5–5.1)
Sodium: 139 mmol/L (ref 135–145)
Total Bilirubin: 0.7 mg/dL (ref 0.3–1.2)
Total Protein: 4.6 g/dL — ABNORMAL LOW (ref 6.5–8.1)

## 2016-01-12 LAB — CBC
HCT: 18.1 % — ABNORMAL LOW (ref 39.0–52.0)
Hemoglobin: 5.8 g/dL — CL (ref 13.0–17.0)
MCH: 29.4 pg (ref 26.0–34.0)
MCHC: 32 g/dL (ref 30.0–36.0)
MCV: 91.9 fL (ref 78.0–100.0)
Platelets: 104 10*3/uL — ABNORMAL LOW (ref 150–400)
RBC: 1.97 MIL/uL — ABNORMAL LOW (ref 4.22–5.81)
RDW: 13.6 % (ref 11.5–15.5)
WBC: 9.7 10*3/uL (ref 4.0–10.5)

## 2016-01-12 LAB — GLUCOSE, CAPILLARY
GLUCOSE-CAPILLARY: 239 mg/dL — AB (ref 65–99)
Glucose-Capillary: 168 mg/dL — ABNORMAL HIGH (ref 65–99)
Glucose-Capillary: 190 mg/dL — ABNORMAL HIGH (ref 65–99)
Glucose-Capillary: 171 mg/dL — ABNORMAL HIGH (ref 65–99)

## 2016-01-12 LAB — PREPARE RBC (CROSSMATCH)

## 2016-01-12 LAB — HEMOGLOBIN A1C
HEMOGLOBIN A1C: 7.9 % — AB (ref 4.8–5.6)
MEAN PLASMA GLUCOSE: 180 mg/dL

## 2016-01-12 MED ORDER — SODIUM CHLORIDE 0.9 % IV SOLN
Freq: Once | INTRAVENOUS | Status: AC
  Administered 2016-01-12: 18:00:00 via INTRAVENOUS

## 2016-01-12 MED ORDER — OXYCODONE-ACETAMINOPHEN 5-325 MG PO TABS
1.0000 | ORAL_TABLET | ORAL | Status: DC | PRN
Start: 2016-01-12 — End: 2016-01-17
  Administered 2016-01-13 – 2016-01-16 (×6): 1 via ORAL
  Filled 2016-01-12 (×6): qty 1

## 2016-01-12 MED ORDER — METFORMIN HCL 500 MG PO TABS
1000.0000 mg | ORAL_TABLET | Freq: Two times a day (BID) | ORAL | Status: DC
  Administered 2016-01-15 – 2016-01-17 (×4): 1000 mg via ORAL
  Filled 2016-01-12 (×4): qty 2

## 2016-01-12 MED ORDER — ACETAMINOPHEN 325 MG PO TABS
650.0000 mg | ORAL_TABLET | ORAL | Status: DC | PRN
  Administered 2016-01-12 (×2): 650 mg via ORAL
  Filled 2016-01-12 (×2): qty 2

## 2016-01-12 NOTE — Progress Notes (Signed)
Subjective: Complaining only of pain in right hip/ buttock Abdomen - non-tender Tolerating clears; no problems  Objective: Vital signs in last 24 hours: Temp:  [97.5 F (36.4 C)-100.8 F (38.2 C)] 100.8 F (38.2 C) (10/01 0712) Pulse Rate:  [101-115] 112 (10/01 0712) Resp:  [20-25] 22 (10/01 0712) BP: (95-140)/(53-67) 140/58 (10/01 0712) SpO2:  [91 %-100 %] 95 % (10/01 0712)    Intake/Output from previous day: 09/30 0701 - 10/01 0700 In: 1375 [I.V.:1375] Out: 1125 [Urine:1125] Intake/Output this shift: Total I/O In: -  Out: 425 [Urine:425]  WDWN in NAD Lungs - cTA B CV - RRR Abd - soft, mild suprapubic tenderness; + BS SP tube in place - clear yellow urine  Lab Results:   Recent Labs  01/10/16 1747  01/10/16 2152 01/11/16 0502  WBC 22.6*  --   --  15.1*  HGB 10.8*  < > 8.8* 8.2*  HCT 32.8*  < > 26.0* 25.7*  PLT 194  --   --  171  < > = values in this interval not displayed. BMET  Recent Labs  01/11/16 0502 01/11/16 1313  NA 138 135  K 7.0* 5.2*  CL 111 110  CO2 10* 16*  GLUCOSE 424* 341*  BUN 24* 30*  CREATININE 2.07* 2.29*  CALCIUM 7.4* 7.2*   PT/INR  Recent Labs  01/11/16 0502  LABPROT 16.1*  INR 1.28   ABG No results for input(s): PHART, HCO3 in the last 72 hours.  Invalid input(s): PCO2, PO2  Studies/Results: Ct Chest W Contrast  Result Date: 01/10/2016 CLINICAL DATA:  Level 1 trauma, bleeding at the meatus of the penis. EXAM: CT CHEST WITH CONTRAST CT ABDOMEN AND PELVIS WITH AND WITHOUT CONTRAST TECHNIQUE: Multidetector CT imaging of the chest was performed during intravenous contrast administration. Multidetector CT imaging of the abdomen and pelvis was performed following the standard protocol before and during bolus administration of intravenous contrast. CONTRAST:  164mL ISOVUE-300 IOPAMIDOL (ISOVUE-300) INJECTION 61% COMPARISON:  12/18/2015 CT abdomen and pelvis FINDINGS: CT CHEST FINDINGS Cardiovascular: Normal size cardiac  chambers. No pericardial effusion. Minimal coronary arteriosclerosis. Mediastinum/Nodes: No mediastinal hematoma. Normal branching pattern of the great vessels. Slightly ectatic ascending thoracic aorta measuring up to 3.6 cm at the level of the main pulmonary artery. No dissection of the thoracic aorta. Lungs/Pleura: No effusion or pneumothorax. Bibasilar atelectasis. Pulmonary contusion nor consolidation. Musculoskeletal: Old right eighth rib fracture with subtle deformity. No acute displaced fracture of the bony thorax. Osteoarthritis of the sternoclavicular joints. CT ABDOMEN AND PELVIS FINDINGS Hepatobiliary: Slightly hypodense appearance of the liver which may reflect fatty infiltration. No space-occupying mass. The left lobe is seen draped over the normal size spleen. Pancreas: Unremarkable. No pancreatic ductal dilatation or surrounding inflammatory changes. Spleen: No splenic injury or perisplenic hematoma. Small splenule measuring 7 mm Adrenals/Urinary Tract: Bilateral renal cysts are identified the largest is interpolar on the right measuring 1.6 cm and on the left, involving the lower pole measuring 1.9 cm. Bilateral punctate calcifications are identified the largest is on the left measuring 2 mm. The bladder is contracted in appearance. Overlying the bladder anteriorly in the space of Retzius and extending cephalad is heterogeneous isodense to hyperdense fluid in keeping with hemorrhage and clot. This measures approximately 12.9 cm craniocaudad by 3 cm AP x 12.4 cm transverse. Delayed images were repeated through the bladder demonstrating contrast opacified urine along an intact posterior bladder wall. The anterior wall where the adjacent hemorrhage however is seen cannot be definitely cleared for tear/rupture.  Stomach/Bowel: Stomach is within normal limits. Appendix appears normal. No evidence of bowel wall thickening, distention, or inflammatory changes. Vascular/Lymphatic: Aortic atherosclerosis. No  enlarged abdominal or pelvic lymph nodes. Reproductive: Prostate is unremarkable. Other: Retroperitoneal hematoma with a asymmetric enlargement of the right psoas muscle suggesting an intramuscular component to hematoma. Overlying ill-defined free fluid is noted abutting the right psoas muscle. Soft tissue induration of the right gluteal soft tissues likely representing contusion. Musculoskeletal: There are acute fractures of the bony pelvis which are as follows: 1. Sagittal right sacral alar fracture extending across the right sacroiliac joint and involving the right iliac bone. This appears to spare the sacral neural foramina. 2. Bilateral parasymphyseal fractures extending into the pubic symphysis posteriorly. 3.  Right inferior pubic ramus fracture with minimal displacement. There is lower lumbar degenerative disc disease at L4-5 and L5-S1. Multilevel facet hypertrophy and sclerosis most prominently at L5-S1 causing moderate to marked neural foraminal stenosis. No acute osseous abnormality of the dorsal spine. IMPRESSION: 1. Acute fractures of the right sacral ala extending across the right sacroiliac joint and involving the posterior right iliac bone. No significant displacement. 2. Acute bilateral parasymphyseal fractures with fracture lucencies extending into the pubic symphysis. 3. Acute minimally displaced right inferior pubic ramus fracture. 4. Hemorrhage and clot interposed between the pubic symphysis and bladder extending cephalad in the space of Retzius, measuring approximately 12.9 x 3 x 12.4 cm. No acute chest abnormalities. These results were called by telephone at the time of interpretation on 01/10/2016 at 7:20 pm to Dr. Gershon Crane , who verbally acknowledged these results. Electronically Signed   By: Ashley Royalty M.D.   On: 01/10/2016 19:35   Ct Abdomen Pelvis W Contrast  Result Date: 01/10/2016 CLINICAL DATA:  Level 1 trauma, bleeding at the meatus of the penis. EXAM: CT CHEST WITH CONTRAST CT  ABDOMEN AND PELVIS WITH AND WITHOUT CONTRAST TECHNIQUE: Multidetector CT imaging of the chest was performed during intravenous contrast administration. Multidetector CT imaging of the abdomen and pelvis was performed following the standard protocol before and during bolus administration of intravenous contrast. CONTRAST:  128mL ISOVUE-300 IOPAMIDOL (ISOVUE-300) INJECTION 61% COMPARISON:  12/18/2015 CT abdomen and pelvis FINDINGS: CT CHEST FINDINGS Cardiovascular: Normal size cardiac chambers. No pericardial effusion. Minimal coronary arteriosclerosis. Mediastinum/Nodes: No mediastinal hematoma. Normal branching pattern of the great vessels. Slightly ectatic ascending thoracic aorta measuring up to 3.6 cm at the level of the main pulmonary artery. No dissection of the thoracic aorta. Lungs/Pleura: No effusion or pneumothorax. Bibasilar atelectasis. Pulmonary contusion nor consolidation. Musculoskeletal: Old right eighth rib fracture with subtle deformity. No acute displaced fracture of the bony thorax. Osteoarthritis of the sternoclavicular joints. CT ABDOMEN AND PELVIS FINDINGS Hepatobiliary: Slightly hypodense appearance of the liver which may reflect fatty infiltration. No space-occupying mass. The left lobe is seen draped over the normal size spleen. Pancreas: Unremarkable. No pancreatic ductal dilatation or surrounding inflammatory changes. Spleen: No splenic injury or perisplenic hematoma. Small splenule measuring 7 mm Adrenals/Urinary Tract: Bilateral renal cysts are identified the largest is interpolar on the right measuring 1.6 cm and on the left, involving the lower pole measuring 1.9 cm. Bilateral punctate calcifications are identified the largest is on the left measuring 2 mm. The bladder is contracted in appearance. Overlying the bladder anteriorly in the space of Retzius and extending cephalad is heterogeneous isodense to hyperdense fluid in keeping with hemorrhage and clot. This measures approximately  12.9 cm craniocaudad by 3 cm AP x 12.4 cm transverse. Delayed images were repeated  through the bladder demonstrating contrast opacified urine along an intact posterior bladder wall. The anterior wall where the adjacent hemorrhage however is seen cannot be definitely cleared for tear/rupture. Stomach/Bowel: Stomach is within normal limits. Appendix appears normal. No evidence of bowel wall thickening, distention, or inflammatory changes. Vascular/Lymphatic: Aortic atherosclerosis. No enlarged abdominal or pelvic lymph nodes. Reproductive: Prostate is unremarkable. Other: Retroperitoneal hematoma with a asymmetric enlargement of the right psoas muscle suggesting an intramuscular component to hematoma. Overlying ill-defined free fluid is noted abutting the right psoas muscle. Soft tissue induration of the right gluteal soft tissues likely representing contusion. Musculoskeletal: There are acute fractures of the bony pelvis which are as follows: 1. Sagittal right sacral alar fracture extending across the right sacroiliac joint and involving the right iliac bone. This appears to spare the sacral neural foramina. 2. Bilateral parasymphyseal fractures extending into the pubic symphysis posteriorly. 3.  Right inferior pubic ramus fracture with minimal displacement. There is lower lumbar degenerative disc disease at L4-5 and L5-S1. Multilevel facet hypertrophy and sclerosis most prominently at L5-S1 causing moderate to marked neural foraminal stenosis. No acute osseous abnormality of the dorsal spine. IMPRESSION: 1. Acute fractures of the right sacral ala extending across the right sacroiliac joint and involving the posterior right iliac bone. No significant displacement. 2. Acute bilateral parasymphyseal fractures with fracture lucencies extending into the pubic symphysis. 3. Acute minimally displaced right inferior pubic ramus fracture. 4. Hemorrhage and clot interposed between the pubic symphysis and bladder extending  cephalad in the space of Retzius, measuring approximately 12.9 x 3 x 12.4 cm. No acute chest abnormalities. These results were called by telephone at the time of interpretation on 01/10/2016 at 7:20 pm to Dr. Gershon Crane , who verbally acknowledged these results. Electronically Signed   By: Ashley Royalty M.D.   On: 01/10/2016 19:35   Dg Retrograde-urethrogram  Result Date: 01/10/2016 CLINICAL DATA:  Tractor accident.  Pelvic fractures. EXAM: RETROGRADE URETHROGRAPHY TECHNIQUE: Following catheterization of the urethral meatus water-soluble contrast material was injected in a retrograde fashion into the urethra. CONTRAST:  30 cc Conrad Cysto FLUOROSCOPY TIME:  Radiation Exposure Index (as provided by the fluoroscopic device): If the device does not provide the exposure index: Fluoroscopy Time (in minutes and seconds):  1 minutes 20 seconds Number of Acquired Images:  13 COMPARISON:  CT 01/10/2016 FINDINGS: At the outset of the examination there was already contrast material within the urinary bladder from the CT of the abdomen and pelvis performed earlier. Retrograde injection of the Foley catheter resultant in Opacification of the distal and mid urethra to the level of the bulbous urethra. No passage of contrast material beyond the level of the bulbous urethra identified. Venous intravasation was visualized at the level of the bulbous urethra indicating adequate injection pressures. No extravasation of contrast material from the urethra identified . IMPRESSION: Imaging findings compatible with an obstructed urethrogram at the junction between the bulbous urethra and membranous urethra. No extravasation of contrast material from the urethra identified. These results were called by telephone at the time of interpretation on 01/10/2016 at 8:48 pm to Dr. Dayna Barker , who verbally acknowledged these results. Electronically Signed   By: Kerby Moors M.D.   On: 01/10/2016 20:51   Ct Image Guided Drainage By Percutaneous  Catheter  Result Date: 01/11/2016 INDICATION: Tractor injury, now with pelvic fractures and traumatic laceration of the urethra. Failed Foley catheter placement. Request made for image guided placement of a suprapubic catheter. EXAM: ULTRASOUND AND CT-GUIDED SUPRAPUBIC CATHETER PLACEMENT  COMPARISON:  CT the abdomen pelvis - 01/10/2016; retrograde urethrogram -01/10/2016 MEDICATIONS: None ANESTHESIA/SEDATION: Moderate (conscious) sedation was employed during this procedure. A total of Versed 1 mg and Fentanyl 50 mcg was administered intravenously. Moderate Sedation Time: 20 minutes. The patient's level of consciousness and vital signs were monitored continuously by radiology nursing throughout the procedure under my direct supervision. CONTRAST:  None FLUOROSCOPY TIME:  None COMPLICATIONS: None immediate. PROCEDURE: The procedure, risks, benefits, and alternatives were explained to the patient. Questions regarding the procedure were encouraged and answered. The patient understands and consents to the procedure. A timeout was performed prior to the initiation of the procedure. The patient was positioned supine on the CT gantry. Preprocedural imaging was obtained of the lower pelvis. The skin overlying the anterior aspect the lower pelvis was prepped and draped in usual sterile fashion. Appropriately planning was performed with the use of a 22 gauge spinal needle. Under direct ultrasound guidance, the inferior anterior aspect of the urinary bladder was accessed with an 18 gauge trocar needle. Appropriate positioning was confirmed with the efflux of urine. A short Amplatz wire was advanced through the trocar needle ankle within the urinary bladder. Appropriate positioning was confirmed with CT imaging. The track was dilated allowing placement of a 12 French percutaneous drainage catheter with and coiled and locked within the urinary bladder. The catheter was connected to a gravity bag healing the brisk return of  normal urine. No significant hematuria. The exit site of the catheter was secured within interrupted suture. A dressing was placed. The patient tolerated the procedure well without immediate postprocedural complication. FINDINGS: CT imaging demonstrates distention of the urinary bladder. There is a moderate amount of excreted contrast seen within the urinary bladder. Demonstrated multiple pelvic fractures with a hematoma about the anterior inferior aspect of the lower abdomen/pelvis, similar to preceding abdominal CT. There is no bowel interposed between the anterior aspect of the urinary bladder and the abdominal wall. After percutaneous drainage catheter placement, the 12 French percutaneous drain is coiled and locked within the urinary bladder. IMPRESSION: Technically successful ultrasound and CT-guided placement of a suprapubic catheter. Electronically Signed   By: Sandi Mariscal M.D.   On: 01/11/2016 08:47    Anti-infectives: Anti-infectives    None      Assessment/Plan: Tractor accident/ pelvic crush injury 1..  Right sacral ala fracture extending across right SI join to right iliac bone 2.  Bilateral parasymphyseal fxs extending into pubic symphysis 3.  Right inferior pubic ramus fracture Ortho management per Dr. Ninfa Linden - touchdown weight bearing right leg 4.  Urethral injury - s/p SP tube placement by IR Urology Recommendations: 1. No further imaging required from urologic perspective acutely. IR SPT placement clearly defines bladder anatomy, no further need for cystogram 2. Continue SPT to straight drain. Recommended to RN staff to switch to larger standing gravity bag this morning and be sure to have below the level of the bladder for adequate drainage 3. Do not place anything via urethra 4. Suprapubic catheter will need to be exchanged q4 weeks and with IR at least for first few exchanges. Should try to upsize catheter size with each exchange 5. Long term urologic plan will be follow  up as outpatient in 4-6 weeks with consideration for GA cystoscopy, RUG, attempt at Foley placement if amenable +/- antegrade flex cystoscopy via SPT with antegrade urethrogram as well  6. Continue to trend Cr  5.  Hyperkalemia - will recheck; improved yesterday 6.  Acute blood loss anemia -  recheck labs  If labs/ K/ renal function stable, will transfer to floor later today.  LOS: 2 days    Adilyn Humes K. 01/12/2016

## 2016-01-12 NOTE — Progress Notes (Signed)
Patient running fever of 101.4 orally after just drinking something cold. Notified Dr. Georgette Dover of fever and obtained order for Tylenol and was instructed to wait a few hours for pt's fever to go down then administer blood as ordered. Will continue to monitor.

## 2016-01-12 NOTE — Progress Notes (Signed)
Suprapubic cath flushed with 5cc of NS at 2100 and 0530. Patent and draining clear, yellow urine. Will continue to monitor.

## 2016-01-12 NOTE — Progress Notes (Signed)
Spoke with Dr. Georgette Dover regarding spike in pt's temp after first unit of blood to 102.9. Told to give the Tylenol, recheck in an hour, if temp is less than 100.0, transfuse 2nd unit. Current temp is 100.1. Will recheck temp and anticipate tranfusing 2nd unit.

## 2016-01-12 NOTE — Progress Notes (Signed)
PT Cancellation Note  Patient Details Name: LORIMER TAKASHIMA MRN: [id number] DOB: December 18, 1940   Cancelled Treatment:    Reason Eval/Treat Not Completed: Medical issues which prohibited therapy   Hgb 5.8 at this time; noted he will receive blood later today;  Will follow up for PT eval tomorrow;   Roney Marion, [place] Pager [phone] Office [phone]    Roney Marion [facility] 01/12/2016, 3:08 PM

## 2016-01-12 NOTE — Progress Notes (Signed)
Patient's fever spiked to 102.9 at the end of his blood transfusion when post vitals were being taken. Patient was given Tylenol after IV was flushed and saline started. This was during shift change and the night time RN is notifying the MD.

## 2016-01-12 NOTE — Progress Notes (Signed)
Referring Physician(s): Dr. Judeth Horn  Supervising Physician: Sandi Mariscal  Patient Status:  Inpatient  Chief Complaint: Traumatic urethral injury  Subjective: S/p Suprapubic tube placement 9/28 Feels okay, sore at drain site as expected. Good UOP  Allergies: Review of patient's allergies indicates no known allergies.  Medications:  Current Facility-Administered Medications:  .  0.9 %  sodium chloride infusion, , Intravenous, Continuous, Donnie Mesa, MD, Last Rate: 125 mL/hr at 01/12/16 0530 .  amLODipine (NORVASC) tablet 7.5 mg, 7.5 mg, Oral, Daily, Donnie Mesa, MD, 7.5 mg at 01/12/16 0924 .  atorvastatin (LIPITOR) tablet 80 mg, 80 mg, Oral, Daily, Donnie Mesa, MD, 80 mg at 01/12/16 0924 .  calcium carbonate (OS-CAL - dosed in mg of elemental calcium) tablet 500 mg of elemental calcium, 1 tablet, Oral, TID WC, Georganna Skeans, MD, 500 mg of elemental calcium at 01/12/16 0758 .  pantoprazole (PROTONIX) EC tablet 40 mg, 40 mg, Oral, BID, 40 mg at 01/12/16 0924 **OR** famotidine (PEPCID) IVPB 20 mg premix, 20 mg, Intravenous, BID, Donnie Mesa, MD .  fluticasone (FLONASE) 50 MCG/ACT nasal spray 2 spray, 2 spray, Each Nare, Daily, Donnie Mesa, MD .  gabapentin (NEURONTIN) capsule 300 mg, 300 mg, Oral, TID, Donnie Mesa, MD, 300 mg at 01/12/16 0924 .  glipiZIDE (GLUCOTROL) tablet 10 mg, 10 mg, Oral, QAC breakfast, 10 mg at 01/12/16 0924 **AND** glipiZIDE (GLUCOTROL) tablet 5 mg, 5 mg, Oral, QAC supper, Karmen Bongo, MD, 5 mg at 01/11/16 1818 .  HYDROmorphone (DILAUDID) injection 1 mg, 1 mg, Intravenous, Q2H PRN, Donnie Mesa, MD, 1 mg at 01/11/16 1212 .  insulin aspart (novoLOG) injection 0-15 Units, 0-15 Units, Subcutaneous, TID WC, Donnie Mesa, MD, 3 Units at 01/12/16 0759 .  insulin aspart (novoLOG) injection 0-5 Units, 0-5 Units, Subcutaneous, QHS, Donnie Mesa, MD .  insulin aspart (novoLOG) injection 4 Units, 4 Units, Subcutaneous, TID WC, Donnie Mesa, MD, 4  Units at 01/12/16 0759 .  [START ON 01/13/2016] metFORMIN (GLUCOPHAGE) tablet 1,000 mg, 1,000 mg, Oral, BID WC, Donnie Mesa, MD .  ondansetron [facility]) tablet 4 mg, 4 mg, Oral, Q6H PRN **OR** ondansetron (ZOFRAN) injection 4 mg, 4 mg, Intravenous, Q6H PRN, Donnie Mesa, MD, 4 mg at 01/11/16 [id number] .  oxyCODONE-acetaminophen (PERCOCET/ROXICET) 5-325 MG per tablet 1 tablet, 1 tablet, Oral, Q4H PRN, Donnie Mesa, MD    Vital Signs: BP (!) 140/58 (BP Location: Right Arm)   Pulse (!) 112   Temp (!) 100.8 F (38.2 C) (Oral)   Resp (!) 22   Ht 6\' 2"  (1.88 m)   Wt 222 lb (100.7 kg)   SpO2 95%   BMI 28.50 kg/m   Physical Exam SP tube inatct, site clean. Mildly tender, no erythema or drainage at site Clear UOP  Imaging:  Ct Image Guided Drainage By Percutaneous Catheter  Result Date: 01/11/2016 INDICATION: Tractor injury, now with pelvic fractures and traumatic laceration of the urethra. Failed Foley catheter placement. Request made for image guided placement of a suprapubic catheter. EXAM: ULTRASOUND AND CT-GUIDED SUPRAPUBIC CATHETER PLACEMENT COMPARISON:  CT the abdomen pelvis - 01/10/2016; retrograde urethrogram -01/10/2016 MEDICATIONS: None ANESTHESIA/SEDATION: Moderate (conscious) sedation was employed during this procedure. A total of Versed 1 mg and Fentanyl 50 mcg was administered intravenously. Moderate Sedation Time: 20 minutes. The patient's level of consciousness and vital signs were monitored continuously by radiology nursing throughout the procedure under my direct supervision. CONTRAST:  None FLUOROSCOPY TIME:  None COMPLICATIONS: None immediate. PROCEDURE: The procedure, risks, benefits, and alternatives were explained  to the patient. Questions regarding the procedure were encouraged and answered. The patient understands and consents to the procedure. A timeout was performed prior to the initiation of the procedure. The patient was positioned supine on the CT gantry. Preprocedural  imaging was obtained of the lower pelvis. The skin overlying the anterior aspect the lower pelvis was prepped and draped in usual sterile fashion. Appropriately planning was performed with the use of a 22 gauge spinal needle. Under direct ultrasound guidance, the inferior anterior aspect of the urinary bladder was accessed with an 18 gauge trocar needle. Appropriate positioning was confirmed with the efflux of urine. A short Amplatz wire was advanced through the trocar needle ankle within the urinary bladder. Appropriate positioning was confirmed with CT imaging. The track was dilated allowing placement of a 12 French percutaneous drainage catheter with and coiled and locked within the urinary bladder. The catheter was connected to a gravity bag healing the brisk return of normal urine. No significant hematuria. The exit site of the catheter was secured within interrupted suture. A dressing was placed. The patient tolerated the procedure well without immediate postprocedural complication. FINDINGS: CT imaging demonstrates distention of the urinary bladder. There is a moderate amount of excreted contrast seen within the urinary bladder. Demonstrated multiple pelvic fractures with a hematoma about the anterior inferior aspect of the lower abdomen/pelvis, similar to preceding abdominal CT. There is no bowel interposed between the anterior aspect of the urinary bladder and the abdominal wall. After percutaneous drainage catheter placement, the 12 French percutaneous drain is coiled and locked within the urinary bladder. IMPRESSION: Technically successful ultrasound and CT-guided placement of a suprapubic catheter. Electronically Signed   By: Sandi Mariscal M.D.   On: 01/11/2016 08:47    Labs:  CBC:  Recent Labs  01/10/16 1747 01/10/16 1819 01/10/16 2152 01/11/16 0502  WBC 22.6*  --   --  15.1*  HGB 10.8* 10.5* 8.8* 8.2*  HCT 32.8* 31.0* 26.0* 25.7*  PLT 194  --   --  171    COAGS:  Recent Labs   01/11/16 0502  INR 1.28  APTT 30    BMP:  Recent Labs  01/10/16 1747 01/10/16 1819 01/10/16 2152 01/11/16 0502 01/11/16 1313  NA 135 138 140 138 135  K 4.2 4.3 4.9 7.0* 5.2*  CL 106 109 108 111 110  CO2 16*  --   --  10* 16*  GLUCOSE 328* 325* 373* 424* 341*  BUN 19 20 21* 24* 30*  CALCIUM 8.2*  --   --  7.4* 7.2*  CREATININE 1.44* 1.30* 1.20 2.07* 2.29*  GFRNONAA 46*  --   --  30* 26*  GFRAA 54*  --   --  35* 31*    LIVER FUNCTION TESTS:  Recent Labs  01/11/16 0502  BILITOT 0.6  AST 46*  ALT 33  ALKPHOS 43  PROT 4.8*  ALBUMIN 2.9*    Assessment and Plan: S/p perc SP tube, good function Plan per primary and Urology Call IR if tube issues  Electronically Signed: Ascencion Dike 01/12/2016, 9:34 AM   I spent a total of 15 minutes at the the patient's bedside AND on the patient's hospital floor or unit, greater than 50% of which was counseling/coordinating care for SP tube placement

## 2016-01-12 NOTE — Consult Note (Signed)
Urology Consult Note   Requesting Attending Physician:  Trauma Md, MD Service Providing Consult: Urology Consulting Attending: Matilde Sprang, MD  I performed a history and physical examination of the patient and discussed his management with the resident.  I reviewed the resident's note and agree with the documented findings and plan of care      Assessment:  Patient is a 75 y.o. male with history of DM, HTN, HLD presents with urethral injury s/p crush injury on 01/10/16 at ~4pm after tractor accident. CT A/P shows large extraperitoneal clot anterior to the bladder in the space of Retzius and RUG showed abrupt cessation of contrast opacification at the proximal bulbar urethra. Bedside cystoscopy by urology unsuccessful due to seemingly significant urethral injury near the membranous urethra. Known pelvic fractures secondary to trauma as well. S/p SPT placement with IR with 12Fr SPT on evening of 9/29.   Interval: NAEON. Af. HR in low 100s. BP soft at times, down to 93/49, last 120s/50s. 1.1L UOP via SPT, clear yellow urine. No new labs. SPT draining well.  Recommendations: 1. No further imaging required from urologic perspective 2. Continue SPT to straight drain 3. Do not place anything via urethra 4. Suprapubic catheter will need to be exchanged q4-6 weeks and with IR at least for first few exchanges. Should try to upsize catheter size with each exchange 5. Long term urologic plan will be follow up as outpatient in 4-6 weeks with consideration for GA cystoscopy, RUG, attempt at Foley placement if amenable +/- antegrade flex cystoscopy via SPT with antegrade urethrogram as well. We will coordinate outpatient follow up with Dr. Gaynelle Arabian and/or Bayron Dalto 6. Continue to trend Cr  Thank you for this consult. Please contact the urology consult pager with any further questions/concerns. Sharmaine Base, MD Urology Surgical Resident  Subjective: Right hip pain, but pain better controlled. No  abdominal pain. Tolerating regular diet.   Objective   Vital signs in last 24 hours: BP (!) 140/58 (BP Location: Right Arm)   Pulse (!) 112   Temp (!) 100.8 F (38.2 C) (Oral)   Resp (!) 22   Ht 6\' 2"  (1.88 m)   Wt 222 lb (100.7 kg)   SpO2 95%   BMI 28.50 kg/m   Intake/Output last 3 shifts: I/O last 3 completed shifts: In: 3375 [I.V.:3375] Out: 1715 [Urine:1675; Emesis/NG output:40]  Physical Exam General: appears comfortable, A&O, resting, elderly male HEENT: /AT, EOMI, MMM Pulmonary: Normal work of breathing on RA Cardiovascular: Tachycardic, adequate peripheral perfusion Abdomen: soft, ND, TTP, no flank ecchymosis, left flank abrasions noted, suprapubic in place (12Fr) without significant surrounding erythema GU: uncircumcised male phallus, minor dried blood via urethral meatus, bilateral descended testicles, developing penile/scrotal/perineal ecchymosis (butterfly hematoma), SPT draining light yellow urine without hematuria Extremities: warm and well perfused, no edema  Most Recent Labs: Lab Results  Component Value Date   WBC 15.1 (H) 01/11/2016   HGB 8.2 (L) 01/11/2016   HCT 25.7 (L) 01/11/2016   PLT 171 01/11/2016    Lab Results  Component Value Date   NA 135 01/11/2016   K 5.2 (H) 01/11/2016   CL 110 01/11/2016   CO2 16 (L) 01/11/2016   BUN 30 (H) 01/11/2016   CREATININE 2.29 (H) 01/11/2016   CALCIUM 7.2 (L) 01/11/2016    Lab Results  Component Value Date   ALKPHOS 43 01/11/2016   BILITOT 0.6 01/11/2016   PROT 4.8 (L) 01/11/2016   ALBUMIN 2.9 (L) 01/11/2016   ALT 33 01/11/2016   AST  46 (H) 01/11/2016    Lab Results  Component Value Date   INR 1.28 01/11/2016   APTT 30 01/11/2016     Urine Culture: None   IMAGING: Ct Chest W Contrast  Result Date: 01/10/2016 CLINICAL DATA:  Level 1 trauma, bleeding at the meatus of the penis. EXAM: CT CHEST WITH CONTRAST CT ABDOMEN AND PELVIS WITH AND WITHOUT CONTRAST TECHNIQUE: Multidetector CT  imaging of the chest was performed during intravenous contrast administration. Multidetector CT imaging of the abdomen and pelvis was performed following the standard protocol before and during bolus administration of intravenous contrast. CONTRAST:  124mL ISOVUE-300 IOPAMIDOL (ISOVUE-300) INJECTION 61% COMPARISON:  12/18/2015 CT abdomen and pelvis FINDINGS: CT CHEST FINDINGS Cardiovascular: Normal size cardiac chambers. No pericardial effusion. Minimal coronary arteriosclerosis. Mediastinum/Nodes: No mediastinal hematoma. Normal branching pattern of the great vessels. Slightly ectatic ascending thoracic aorta measuring up to 3.6 cm at the level of the main pulmonary artery. No dissection of the thoracic aorta. Lungs/Pleura: No effusion or pneumothorax. Bibasilar atelectasis. Pulmonary contusion nor consolidation. Musculoskeletal: Old right eighth rib fracture with subtle deformity. No acute displaced fracture of the bony thorax. Osteoarthritis of the sternoclavicular joints. CT ABDOMEN AND PELVIS FINDINGS Hepatobiliary: Slightly hypodense appearance of the liver which may reflect fatty infiltration. No space-occupying mass. The left lobe is seen draped over the normal size spleen. Pancreas: Unremarkable. No pancreatic ductal dilatation or surrounding inflammatory changes. Spleen: No splenic injury or perisplenic hematoma. Small splenule measuring 7 mm Adrenals/Urinary Tract: Bilateral renal cysts are identified the largest is interpolar on the right measuring 1.6 cm and on the left, involving the lower pole measuring 1.9 cm. Bilateral punctate calcifications are identified the largest is on the left measuring 2 mm. The bladder is contracted in appearance. Overlying the bladder anteriorly in the space of Retzius and extending cephalad is heterogeneous isodense to hyperdense fluid in keeping with hemorrhage and clot. This measures approximately 12.9 cm craniocaudad by 3 cm AP x 12.4 cm transverse. Delayed images were  repeated through the bladder demonstrating contrast opacified urine along an intact posterior bladder wall. The anterior wall where the adjacent hemorrhage however is seen cannot be definitely cleared for tear/rupture. Stomach/Bowel: Stomach is within normal limits. Appendix appears normal. No evidence of bowel wall thickening, distention, or inflammatory changes. Vascular/Lymphatic: Aortic atherosclerosis. No enlarged abdominal or pelvic lymph nodes. Reproductive: Prostate is unremarkable. Other: Retroperitoneal hematoma with a asymmetric enlargement of the right psoas muscle suggesting an intramuscular component to hematoma. Overlying ill-defined free fluid is noted abutting the right psoas muscle. Soft tissue induration of the right gluteal soft tissues likely representing contusion. Musculoskeletal: There are acute fractures of the bony pelvis which are as follows: 1. Sagittal right sacral alar fracture extending across the right sacroiliac joint and involving the right iliac bone. This appears to spare the sacral neural foramina. 2. Bilateral parasymphyseal fractures extending into the pubic symphysis posteriorly. 3.  Right inferior pubic ramus fracture with minimal displacement. There is lower lumbar degenerative disc disease at L4-5 and L5-S1. Multilevel facet hypertrophy and sclerosis most prominently at L5-S1 causing moderate to marked neural foraminal stenosis. No acute osseous abnormality of the dorsal spine. IMPRESSION: 1. Acute fractures of the right sacral ala extending across the right sacroiliac joint and involving the posterior right iliac bone. No significant displacement. 2. Acute bilateral parasymphyseal fractures with fracture lucencies extending into the pubic symphysis. 3. Acute minimally displaced right inferior pubic ramus fracture. 4. Hemorrhage and clot interposed between the pubic symphysis and bladder extending  cephalad in the space of Retzius, measuring approximately 12.9 x 3 x 12.4 cm.  No acute chest abnormalities. These results were called by telephone at the time of interpretation on 01/10/2016 at 7:20 pm to Dr. Gershon Crane , who verbally acknowledged these results. Electronically Signed   By: Ashley Royalty M.D.   On: 01/10/2016 19:35   Ct Abdomen Pelvis W Contrast  Result Date: 01/10/2016 CLINICAL DATA:  Level 1 trauma, bleeding at the meatus of the penis. EXAM: CT CHEST WITH CONTRAST CT ABDOMEN AND PELVIS WITH AND WITHOUT CONTRAST TECHNIQUE: Multidetector CT imaging of the chest was performed during intravenous contrast administration. Multidetector CT imaging of the abdomen and pelvis was performed following the standard protocol before and during bolus administration of intravenous contrast. CONTRAST:  178mL ISOVUE-300 IOPAMIDOL (ISOVUE-300) INJECTION 61% COMPARISON:  12/18/2015 CT abdomen and pelvis FINDINGS: CT CHEST FINDINGS Cardiovascular: Normal size cardiac chambers. No pericardial effusion. Minimal coronary arteriosclerosis. Mediastinum/Nodes: No mediastinal hematoma. Normal branching pattern of the great vessels. Slightly ectatic ascending thoracic aorta measuring up to 3.6 cm at the level of the main pulmonary artery. No dissection of the thoracic aorta. Lungs/Pleura: No effusion or pneumothorax. Bibasilar atelectasis. Pulmonary contusion nor consolidation. Musculoskeletal: Old right eighth rib fracture with subtle deformity. No acute displaced fracture of the bony thorax. Osteoarthritis of the sternoclavicular joints. CT ABDOMEN AND PELVIS FINDINGS Hepatobiliary: Slightly hypodense appearance of the liver which may reflect fatty infiltration. No space-occupying mass. The left lobe is seen draped over the normal size spleen. Pancreas: Unremarkable. No pancreatic ductal dilatation or surrounding inflammatory changes. Spleen: No splenic injury or perisplenic hematoma. Small splenule measuring 7 mm Adrenals/Urinary Tract: Bilateral renal cysts are identified the largest is interpolar on the  right measuring 1.6 cm and on the left, involving the lower pole measuring 1.9 cm. Bilateral punctate calcifications are identified the largest is on the left measuring 2 mm. The bladder is contracted in appearance. Overlying the bladder anteriorly in the space of Retzius and extending cephalad is heterogeneous isodense to hyperdense fluid in keeping with hemorrhage and clot. This measures approximately 12.9 cm craniocaudad by 3 cm AP x 12.4 cm transverse. Delayed images were repeated through the bladder demonstrating contrast opacified urine along an intact posterior bladder wall. The anterior wall where the adjacent hemorrhage however is seen cannot be definitely cleared for tear/rupture. Stomach/Bowel: Stomach is within normal limits. Appendix appears normal. No evidence of bowel wall thickening, distention, or inflammatory changes. Vascular/Lymphatic: Aortic atherosclerosis. No enlarged abdominal or pelvic lymph nodes. Reproductive: Prostate is unremarkable. Other: Retroperitoneal hematoma with a asymmetric enlargement of the right psoas muscle suggesting an intramuscular component to hematoma. Overlying ill-defined free fluid is noted abutting the right psoas muscle. Soft tissue induration of the right gluteal soft tissues likely representing contusion. Musculoskeletal: There are acute fractures of the bony pelvis which are as follows: 1. Sagittal right sacral alar fracture extending across the right sacroiliac joint and involving the right iliac bone. This appears to spare the sacral neural foramina. 2. Bilateral parasymphyseal fractures extending into the pubic symphysis posteriorly. 3.  Right inferior pubic ramus fracture with minimal displacement. There is lower lumbar degenerative disc disease at L4-5 and L5-S1. Multilevel facet hypertrophy and sclerosis most prominently at L5-S1 causing moderate to marked neural foraminal stenosis. No acute osseous abnormality of the dorsal spine. IMPRESSION: 1. Acute  fractures of the right sacral ala extending across the right sacroiliac joint and involving the posterior right iliac bone. No significant displacement. 2. Acute bilateral parasymphyseal fractures  with fracture lucencies extending into the pubic symphysis. 3. Acute minimally displaced right inferior pubic ramus fracture. 4. Hemorrhage and clot interposed between the pubic symphysis and bladder extending cephalad in the space of Retzius, measuring approximately 12.9 x 3 x 12.4 cm. No acute chest abnormalities. These results were called by telephone at the time of interpretation on 01/10/2016 at 7:20 pm to Dr. Gershon Crane , who verbally acknowledged these results. Electronically Signed   By: Ashley Royalty M.D.   On: 01/10/2016 19:35   Dg Retrograde-urethrogram  Result Date: 01/10/2016 CLINICAL DATA:  Tractor accident.  Pelvic fractures. EXAM: RETROGRADE URETHROGRAPHY TECHNIQUE: Following catheterization of the urethral meatus water-soluble contrast material was injected in a retrograde fashion into the urethra. CONTRAST:  30 cc Conrad Cysto FLUOROSCOPY TIME:  Radiation Exposure Index (as provided by the fluoroscopic device): If the device does not provide the exposure index: Fluoroscopy Time (in minutes and seconds):  1 minutes 20 seconds Number of Acquired Images:  13 COMPARISON:  CT 01/10/2016 FINDINGS: At the outset of the examination there was already contrast material within the urinary bladder from the CT of the abdomen and pelvis performed earlier. Retrograde injection of the Foley catheter resultant in Opacification of the distal and mid urethra to the level of the bulbous urethra. No passage of contrast material beyond the level of the bulbous urethra identified. Venous intravasation was visualized at the level of the bulbous urethra indicating adequate injection pressures. No extravasation of contrast material from the urethra identified . IMPRESSION: Imaging findings compatible with an obstructed urethrogram at  the junction between the bulbous urethra and membranous urethra. No extravasation of contrast material from the urethra identified. These results were called by telephone at the time of interpretation on 01/10/2016 at 8:48 pm to Dr. Dayna Barker , who verbally acknowledged these results. Electronically Signed   By: Kerby Moors M.D.   On: 01/10/2016 20:51   Ct Image Guided Drainage By Percutaneous Catheter  Result Date: 01/11/2016 INDICATION: Tractor injury, now with pelvic fractures and traumatic laceration of the urethra. Failed Foley catheter placement. Request made for image guided placement of a suprapubic catheter. EXAM: ULTRASOUND AND CT-GUIDED SUPRAPUBIC CATHETER PLACEMENT COMPARISON:  CT the abdomen pelvis - 01/10/2016; retrograde urethrogram -01/10/2016 MEDICATIONS: None ANESTHESIA/SEDATION: Moderate (conscious) sedation was employed during this procedure. A total of Versed 1 mg and Fentanyl 50 mcg was administered intravenously. Moderate Sedation Time: 20 minutes. The patient's level of consciousness and vital signs were monitored continuously by radiology nursing throughout the procedure under my direct supervision. CONTRAST:  None FLUOROSCOPY TIME:  None COMPLICATIONS: None immediate. PROCEDURE: The procedure, risks, benefits, and alternatives were explained to the patient. Questions regarding the procedure were encouraged and answered. The patient understands and consents to the procedure. A timeout was performed prior to the initiation of the procedure. The patient was positioned supine on the CT gantry. Preprocedural imaging was obtained of the lower pelvis. The skin overlying the anterior aspect the lower pelvis was prepped and draped in usual sterile fashion. Appropriately planning was performed with the use of a 22 gauge spinal needle. Under direct ultrasound guidance, the inferior anterior aspect of the urinary bladder was accessed with an 18 gauge trocar needle. Appropriate positioning was  confirmed with the efflux of urine. A short Amplatz wire was advanced through the trocar needle ankle within the urinary bladder. Appropriate positioning was confirmed with CT imaging. The track was dilated allowing placement of a 12 French percutaneous drainage catheter with and coiled and locked within  the urinary bladder. The catheter was connected to a gravity bag healing the brisk return of normal urine. No significant hematuria. The exit site of the catheter was secured within interrupted suture. A dressing was placed. The patient tolerated the procedure well without immediate postprocedural complication. FINDINGS: CT imaging demonstrates distention of the urinary bladder. There is a moderate amount of excreted contrast seen within the urinary bladder. Demonstrated multiple pelvic fractures with a hematoma about the anterior inferior aspect of the lower abdomen/pelvis, similar to preceding abdominal CT. There is no bowel interposed between the anterior aspect of the urinary bladder and the abdominal wall. After percutaneous drainage catheter placement, the 12 French percutaneous drain is coiled and locked within the urinary bladder. IMPRESSION: Technically successful ultrasound and CT-guided placement of a suprapubic catheter. Electronically Signed   By: Sandi Mariscal M.D.   On: 01/11/2016 08:47

## 2016-01-12 NOTE — Progress Notes (Signed)
CRITICAL VALUE ALERT  Critical value received:  Hgb 5.8  Date of notification:  01/12/2016  Time of notification:  11:55  Critical value read back:Yes.    Nurse who received alert:  Norva Karvonen, RN  MD notified (1st page):  Dr. Georgette Dover   Time of first page:  12:30  MD notified (2nd page):  Time of second page:  Responding MD:  Dr. Georgette Dover  Time MD responded:  12:32

## 2016-01-13 ENCOUNTER — Inpatient Hospital Stay (HOSPITAL_COMMUNITY): Payer: Medicare Other

## 2016-01-13 LAB — TYPE AND SCREEN
ABO/RH(D): O POS
ABO/RH(D): O POS
Antibody Screen: NEGATIVE
Antibody Screen: NEGATIVE
UNIT DIVISION: 0
UNIT DIVISION: 0
Unit division: 0
Unit division: 0

## 2016-01-13 LAB — CBC WITH DIFFERENTIAL/PLATELET
BASOS PCT: 0 %
Basophils Absolute: 0 10*3/uL (ref 0.0–0.1)
EOS PCT: 1 %
Eosinophils Absolute: 0.1 10*3/uL (ref 0.0–0.7)
HEMATOCRIT: 21.2 % — AB (ref 39.0–52.0)
Hemoglobin: 7.1 g/dL — ABNORMAL LOW (ref 13.0–17.0)
LYMPHS ABS: 2.1 10*3/uL (ref 0.7–4.0)
Lymphocytes Relative: 26 %
MCH: 30 pg (ref 26.0–34.0)
MCHC: 33.5 g/dL (ref 30.0–36.0)
MCV: 89.5 fL (ref 78.0–100.0)
MONOS PCT: 8 %
Monocytes Absolute: 0.6 10*3/uL (ref 0.1–1.0)
NEUTROS ABS: 5.3 10*3/uL (ref 1.7–7.7)
NEUTROS PCT: 65 %
Platelets: 89 10*3/uL — ABNORMAL LOW (ref 150–400)
RBC: 2.37 MIL/uL — ABNORMAL LOW (ref 4.22–5.81)
RDW: 14 % (ref 11.5–15.5)
WBC: 8.1 10*3/uL (ref 4.0–10.5)

## 2016-01-13 LAB — BASIC METABOLIC PANEL
Anion gap: 5 (ref 5–15)
BUN: 16 mg/dL (ref 6–20)
CALCIUM: 7.6 mg/dL — AB (ref 8.9–10.3)
CHLORIDE: 112 mmol/L — AB (ref 101–111)
CO2: 21 mmol/L — AB (ref 22–32)
CREATININE: 1.08 mg/dL (ref 0.61–1.24)
GFR calc Af Amer: >60 mL/min (ref >60)
GFR calc non Af Amer: >60 mL/min (ref >60)
GLUCOSE: 198 mg/dL — AB (ref 65–99)
Potassium: 4.1 mmol/L (ref 3.5–5.1)
Sodium: 138 mmol/L (ref 135–145)

## 2016-01-13 LAB — GLUCOSE, CAPILLARY
GLUCOSE-CAPILLARY: 203 mg/dL — AB (ref 65–99)
GLUCOSE-CAPILLARY: 227 mg/dL — AB (ref 65–99)
GLUCOSE-CAPILLARY: 257 mg/dL — AB (ref 65–99)
Glucose-Capillary: 191 mg/dL — ABNORMAL HIGH (ref 65–99)

## 2016-01-13 MED ORDER — WHITE PETROLATUM GEL
Status: AC
  Administered 2016-01-13: 10:00:00
  Filled 2016-01-13: qty 1

## 2016-01-13 NOTE — Progress Notes (Signed)
Inpatient Rehabilitation  OT is recommending IP Rehab.  Dr. Richarda Blade note indicates his plan to discuss case with orthopedics for ? of surgical plan.  Will await further input regarding plan of care before recommending IP Rehab consult.  Please call if questions.  [facility] Admissions Coordinator Cell [phone] Office [phone]

## 2016-01-13 NOTE — Consult Note (Addendum)
[facility] Nurse wound consult note Reason for Consult: Farming accident.  Pinned between tractor and pole with resulting right hip fracture.  Consulted due to maroon discoloration and bruising to scrotum and gluteal fold.  Intense edema to scrotum, S/P catheter in place. Inability to sense the need to defecate at this time. This bruising is consistent with crush injury, no need to pressure injury intervention.  Recommend changing position every two hours to offload pressure.  Wife at bedside and both verbalize understanding.  Wound type:traumatic injury Pressure Ulcer POA: Yes  Pressure injury from farm equipment Measurement:Scrotum and lower buttocks are bruised and intact.  Scrotum with diffuse edema Wound bed:Intact bruising Drainage (amount, consistency, odor) None Periwound:Bruising / Right hip fracture.  Dressing procedure/placement/frequency:Keep scrotum clean and dry.  Offload pressure/change position every two hours.  Will not follow at this time.  Please re-consult if needed.  Domenic Moras RN BSN [place] Pager [phone]

## 2016-01-13 NOTE — Consult Note (Signed)
Urology Consult Note   Requesting Attending Physician:  Trauma Md, MD Service Providing Consult: Urology Consulting Attending: Matilde Sprang, MD   Assessment:  Patient is a 75 y.o. male with history of DM, HTN, HLD presents with urethral injury s/p crush injury on 01/10/16 at ~4pm after tractor accident. CT A/P shows large extraperitoneal clot anterior to the bladder in the space of Retzius and RUG showed abrupt cessation of contrast opacification at the proximal bulbar urethra. Bedside cystoscopy by urology unsuccessful due to seemingly significant urethral injury near the membranous urethra. Known pelvic fractures secondary to trauma as well. S/p SPT placement with IR with 12Fr SPT on evening of 9/29.   Interval: Hb 5.9 yesterday, s/p 2U pRBC with apparent transfusion reaction with Tmax 102.9. AF today with HR 110s, normotensive. Adequate UOP via SPT. Increasing scrotal, penile, perineal edema/ecchymosis.  NAEON. Af. HR in low 100s. BP soft at times, down to 93/49, last 120s/50s. 1.1L UOP via SPT, clear yellow urine. No new labs. SPT draining well. Cr normalizing to 1.08. Hb 7.1 today.  Recommendations: 1. Continue SPT to straight drain 2. Do not place anything via urethra 3. Suprapubic catheter will need to be exchanged q4-6 weeks and with IR at least for first few exchanges. Should try to upsize catheter size with each exchange 4. Continue to trend Cr 5. Elevate scrotum with towels and kerlix to treat edema  Thank you for this consult. Please contact the urology consult pager with any further questions/concerns. Sharmaine Base, MD Urology Surgical Resident  Subjective: Pain better controlled. Tolerating food, no n/v. Good spirits.  Objective   Vital signs in last 24 hours: BP 136/69 (BP Location: Right Arm)   Pulse 97   Temp 98.5 F (36.9 C) (Oral)   Resp 17   Ht 6\' 2"  (1.88 m)   Wt 222 lb (100.7 kg)   SpO2 95%   BMI 28.50 kg/m   Intake/Output last 3 shifts: I/O last 3  completed shifts: In: 2635 [P.O.:840; I.V.:1125; Blood:670] Out: 4025 [Urine:4025]  Physical Exam General: appears comfortable, A&O, resting, elderly male HEENT: [place]/AT, EOMI, MMM Pulmonary: Normal work of breathing on RA Cardiovascular: Tachycardic, adequate peripheral perfusion Abdomen: soft, ND, TTP, no flank ecchymosis, left flank abrasions noted, suprapubic in place (12Fr) without significant surrounding erythema GU: uncircumcised male phallus, increasing penile edema, bilateral descended testicles, increasing penile/scrotal/perineal ecchymosis (butterfly hematoma), SPT draining light yellow urine without hematuria Extremities: warm and well perfused, no edema  Most Recent Labs: Lab Results  Component Value Date   WBC 8.1 01/13/2016   HGB 7.1 (L) 01/13/2016   HCT 21.2 (L) 01/13/2016   PLT 89 (L) 01/13/2016    Lab Results  Component Value Date   NA 138 01/13/2016   K 4.1 01/13/2016   CL 112 (H) 01/13/2016   CO2 21 (L) 01/13/2016   BUN 16 01/13/2016   CREATININE 1.08 01/13/2016   CALCIUM 7.6 (L) 01/13/2016    Lab Results  Component Value Date   ALKPHOS 43 01/12/2016   BILITOT 0.7 01/12/2016   PROT 4.6 (L) 01/12/2016   ALBUMIN 2.6 (L) 01/12/2016   ALT 26 01/12/2016   AST 41 01/12/2016    Lab Results  Component Value Date   INR 1.28 01/11/2016   APTT 30 01/11/2016     Urine Culture: None   IMAGING: No results found.

## 2016-01-13 NOTE — Progress Notes (Signed)
Trauma Service Note  Subjective: Patient had no labs drawn this AM.  He is pink and warm and quite coherent.    Objective: Vital signs in last 24 hours: Temp:  [97.9 F (36.6 C)-102.9 F (39.4 C)] 99.9 F (37.7 C) (10/02 0305) Pulse Rate:  [90-112] 90 (10/02 0305) Resp:  [18-27] 18 (10/02 0305) BP: (117-152)/(53-72) 152/66 (10/02 0305) SpO2:  [93 %-100 %] 100 % (10/02 0305) Last BM Date:  (pta)  Intake/Output from previous day: 10/01 0701 - 10/02 0700 In: 2635 [P.O.:840; I.V.:1125; Blood:670] Out: 3300 [Urine:3300] Intake/Output this shift: No intake/output data recorded.  General: No distress.  Lungs: clear to auscultation  Abd: Soft, mildly distended.  Good bowel sounds.  Extremities: No changes  Neuro: Intact  Lab Results: CBC   Recent Labs  01/11/16 0502 01/12/16 1104  WBC 15.1* 9.7  HGB 8.2* 5.8*  HCT 25.7* 18.1*  PLT 171 104*   BMET  Recent Labs  01/11/16 1313 01/12/16 1104  NA 135 139  K 5.2* 4.7  CL 110 113*  CO2 16* 19*  GLUCOSE 341* 197*  BUN 30* 28*  CREATININE 2.29* 1.57*  CALCIUM 7.2* 7.6*   PT/INR  Recent Labs  01/11/16 0502  LABPROT 16.1*  INR 1.28   ABG No results for input(s): PHART, HCO3 in the last 72 hours.  Invalid input(s): PCO2, PO2  Studies/Results: No results found.  Anti-infectives: Anti-infectives    None      Assessment/Plan: s/p  Discuss case with orthopedic surgery  Will consider advancing diet if the patient is not to have surgery today. Recheck labs.  LOS: 3 days   Kathryne Eriksson. Dahlia Bailiff, MD, FACS [phone] Trauma Surgeon 01/13/2016

## 2016-01-13 NOTE — Progress Notes (Addendum)
Inpatient Diabetes Program Recommendations  AACE/ADA: New Consensus Statement on Inpatient Glycemic Control (2015)  Target Ranges:  Prepandial:   less than 140 mg/dL      Peak postprandial:   less than 180 mg/dL (1-2 hours)      Critically ill patients:  140 - 180 mg/dL   Results for Christian [name], Christian [name] (MRN [id number]) as of 01/13/2016 13:01  Ref. Range 01/12/2016 07:38 01/12/2016 11:27 01/12/2016 17:25 01/12/2016 21:05  Glucose-Capillary Latest Ref Range: 65 - 99 mg/dL 168 (H) 190 (H) 171 (H) 239 (H)   Results for Christian [name], Christian [name] (MRN [id number]) as of 01/13/2016 13:01  Ref. Range 01/13/2016 07:58 01/13/2016 12:00  Glucose-Capillary Latest Ref Range: 65 - 99 mg/dL 191 (H) 203 (H)    Admit with: Crush Injury  History: DM  Home DM Meds: Metformin 1000 mg bid       Glipizide 10 mg AM/ 5 mg PM  Current Insulin Orders: Metformin 1000 mg bid                 Glipizide 10 mg AM/ 5 mg PM      Novolog Moderate Correction Scale/ SSI (0-15 units) TID AC + HS      Novolog 4 units tidwc      MD- Please consider the following in-hospital insulin adjustments:  1. Start low dose basal insulin: Lantus 10 units daily  2. Increase Novolog Meal Coverage to 6 units tidwc (currently ordered as Novolog 4 units tidwc)      --Will follow patient during hospitalization--  Wyn Quaker RN, MSN, CDE Diabetes Coordinator Inpatient Glycemic Control Team Team Pager: [phone] (8a-5p)

## 2016-01-13 NOTE — Progress Notes (Signed)
Patient has a non-fluid filled purple area along the gluteal fold and around the anus.  He also has an edematous and Brzozowski purple scrotum from the traumatic pelvic injuries.  WOC consult initiated.

## 2016-01-13 NOTE — Care Management Important Message (Signed)
Important Message  Patient Details  Name: Christian [name] MRN: [id number] Date of Birth: 03-Oct-1940   Medicare Important Message Given:  Yes    [name] May 01/13/2016, 12:49 PM

## 2016-01-13 NOTE — Progress Notes (Signed)
Orthopaedic Trauma Service (OTS) Consult   Reason for Consult: pelvic ring fracture  Referring Physician: Kathrynn Speed, MD (ortho)   HPI: Christian [name] is an 75 y.o. white male who was injured while working with his tractor on 01/10/2016. Pt not really sure what happen but his tractor came out of gear and pinned him to a metal pole. Tractor eventually went in reverse, releasing the pt. Pt brought to [facility].   Evaluation was notable for pelvic ring fractures. There was also blood noted at the urethral meatus. Urology consulted. They were unable to perform cystoscopy at the bedside due to obstruction. RUG was also unsuccessful prior to urology attempt at cystoscopy. Pt subsequently had SPT placed    Pt has chronic low back pain as well as herniated discs in his lumbar spine    Pt did work with PT this pm and did ok. Some low back pain with bed mobs but did well   Denies any numbness or tingling in lower extremities   Past Medical History:  Diagnosis Date  . Diabetes mellitus without complication ([place])   . Hyperlipidemia   . Hypertension     Past Surgical History:  Procedure Laterality Date  . ACHILLES TENDON SURGERY Right   . TONSILLECTOMY    . ULNAR NERVE TRANSPOSITION Right     History reviewed. No pertinent family history.  Social History:  reports that he has never smoked. He has never used smokeless tobacco. He reports that he does not drink alcohol. His drug history is not on file.  Allergies: No Known Allergies  Medications:  I have reviewed the patient's current medications. Prior to Admission:   CBG (last 3)   Recent Labs  01/12/16 2105 01/13/16 0758 01/13/16 1200  GLUCAP 239* 191* 203*     Glucose, capillary     Status: Abnormal   Collection Time: 01/13/16  7:58 AM  Result Value Ref Range   Glucose-Capillary 191 (H) 65 - 99 mg/dL   Comment 1 Notify RN   CBC with Differential/Platelet     Status: Abnormal   Collection Time: 01/13/16  8:08 AM   Result Value Ref Range   WBC 8.1 4.0 - 10.5 K/uL   RBC 2.37 (L) 4.22 - 5.81 MIL/uL   Hemoglobin 7.1 (L) 13.0 - 17.0 g/dL   HCT 21.2 (L) 39.0 - 52.0 %   MCV 89.5 78.0 - 100.0 fL   MCH 30.0 26.0 - 34.0 pg   MCHC 33.5 30.0 - 36.0 g/dL   RDW 14.0 11.5 - 15.5 %   Platelets 89 (L) 150 - 400 K/uL    Comment: SPECIMEN CHECKED FOR CLOTS REPEATED TO VERIFY CONSISTENT WITH PREVIOUS RESULT    Neutrophils Relative % 65 %   Lymphocytes Relative 26 %   Monocytes Relative 8 %   Eosinophils Relative 1 %   Basophils Relative 0 %   Neutro Abs 5.3 1.7 - 7.7 K/uL   Lymphs Abs 2.1 0.7 - 4.0 K/uL   Monocytes Absolute 0.6 0.1 - 1.0 K/uL   Eosinophils Absolute 0.1 0.0 - 0.7 K/uL   Basophils Absolute 0.0 0.0 - 0.1 K/uL   RBC Morphology POLYCHROMASIA PRESENT     Comment: ELLIPTOCYTES  Basic metabolic panel     Status: Abnormal   Collection Time: 01/13/16  8:08 AM  Result Value Ref Range   Sodium 138 135 - 145 mmol/L   Potassium 4.1 3.5 - 5.1 mmol/L   Chloride 112 (H) 101 - 111 mmol/L  CO2 21 (L) 22 - 32 mmol/L   Glucose, Bld 198 (H) 65 - 99 mg/dL   BUN 16 6 - 20 mg/dL   Creatinine, Ser 1.08 0.61 - 1.24 mg/dL   Calcium 7.6 (L) 8.9 - 10.3 mg/dL   GFR calc non Af Amer >60 >60 mL/min   GFR calc Af Amer >60 >60 mL/min    Comment: (NOTE) The eGFR has been calculated using the CKD EPI equation. This calculation has not been validated in all clinical situations. eGFR's persistently <60 mL/min signify possible Chronic Kidney Disease.    Anion gap 5 5 - 15  Glucose, capillary     Status: Abnormal   Collection Time: 01/13/16 12:00 PM  Result Value Ref Range   Glucose-Capillary 203 (H) 65 - 99 mg/dL   Comment 1 Notify RN     No results found.  ROS  As above in HPI  Blood pressure 130/73, pulse 99, temperature 99.1 F (37.3 C), temperature source Oral, resp. rate 20, height 6' 2" (1.88 m), weight 100.7 kg (222 lb), SpO2 96 %. Physical Exam  Constitutional: He is cooperative.  Pleasant  gentleman   Cardiovascular: Normal rate, regular rhythm, S1 normal and S2 normal.   Pulmonary/Chest: Effort normal and breath sounds normal.  Abdominal:  Distended, + BS, NT  Musculoskeletal:  Pelvis and Lower Extremities + swelling to lower abdomen, suprapubic region and scrotum  + ecchymosis  Abrasion lateral R hip    Generalized edema    Pain with Lateral compression of pelvis R>L    No pain with axial loading of hips    Knees stable and w/o pain B     Ankles are unremarkable    DPN, SPN, TN sensation intact B    EHL, FHL, AT, PT, peroneals, gastroc motor intact B    Pt can perform active quad sets B     + DP pulses B     No DCT   Neurological: He is alert.     Assessment/Plan:  75 y/o male s/p crush injury   -  Crush injury due to tractor   - R LC2 pelvic ring fracture  TDWB R leg  No ROM restrictions   Continue with therapy   If pt has worsening pain, failure to progress or changes on xrays would then consider SI screw  Will allow pt to work with therapy another 2 days before deciding on surgery   Pt in agreement    Baseline inlet/outlet films   - urethral obstruction   Per urology   - ABL anemia/Hemodynamics  Cbc in am   - Dispo:  Continue with therapy      Jari Pigg, PA-C [facility] [phone] (P) 01/13/2016, 4:50 PM   I have personally reviewed and discussed in detail with [name] the patient's presentation, confirmed the examination findings, and I formulated the plan outlined above.  Patient has possibility of succeeding with nonop but slightly more likely will require fixation across the back of his pelvis; given age, bone quality, and position of fracture would recommend sacroiliac fixation across both right and left sides.  Alt[place]e [place], MD [facility] [phone] (p)

## 2016-01-13 NOTE — Evaluation (Signed)
Occupational Therapy Evaluation Patient Details Name: Christian [name] MRN: [id number] DOB: 1941-01-22 Today's Date: 01/13/2016    History of Present Illness This 75 y.o. male admitted after tractor rolled into him and pinned him against a pole.  Pelvic CT showed:  Sagittal right sacral alar fracture extending across the right   Clinical Impression   Pt admitted with above. He demonstrates the below listed deficits and will benefit from continued OT to maximize safety and independence with BADLs.  Pt presents to OT with acute pain, generalized weakness, decreased activity tolerance.  He requires max - total A for LB ADLs.  He is very motivated, but was limited by low Hbg (7.1), and acute pain today.  Wife very supportive.  Recommend CIR.       Follow Up Recommendations  CIR;Supervision/Assistance - 24 hour    Equipment Recommendations  3 in 1 bedside comode    Recommendations for Other Services Rehab consult     Precautions / Restrictions Precautions Precautions: Fall Restrictions Weight Bearing Restrictions: Yes RLE Weight Bearing: Touchdown weight bearing      Mobility Bed Mobility Overal bed mobility: +2 for physical assistance;Needs Assistance Bed Mobility: Supine to Sit;Sit to Supine     Supine to sit: Max assist;+2 for physical assistance;HOB elevated Sit to supine: Mod assist;+2 for physical assistance;HOB elevated   General bed mobility comments: Pt requires assist for bil. LEs.  He is able to assist some  with moving his UB to seated position and with scooting to EOB.   Pt requires assist to move LEs onto bed, but able to shift hips back and control trunk into supine with HOB elevated.   Transfers                 General transfer comment: Unable to attempt this date due to Hbg 7.1 and pain     Balance Overall balance assessment: Needs assistance Sitting-balance support: Feet supported Sitting balance-Leahy Scale: Fair Sitting balance - Comments:  requires min guard assist                                     ADL Overall ADL's : Needs assistance/impaired Eating/Feeding: Modified independent;Bed level   Grooming: Wash/dry hands;Wash/dry face;Oral care;Brushing hair;Set up;Bed level   Upper Body Bathing: Set up;Bed level   Lower Body Bathing: Maximal assistance;Bed level   Upper Body Dressing : Maximal assistance;Bed level;Sitting   Lower Body Dressing: Total assistance;Bed level   Toilet Transfer: Total assistance Toilet Transfer Details (indicate cue type and reason): unable to attempt  Toileting- Clothing Manipulation and Hygiene: Total assistance;Bed level       Functional mobility during ADLs: Maximal assistance;Moderate assistance;+2 for physical assistance (bed mobility only )       Vision     Perception     Praxis      Pertinent Vitals/Pain Pain Assessment: 0-10 Pain Score: 5  Pain Location: Pelvis and back  Pain Descriptors / Indicators: Aching;Grimacing;Guarding Pain Intervention(s): Monitored during session;Premedicated before session;Repositioned;Limited activity within patient's tolerance     Hand Dominance Right   Extremity/Trunk Assessment Upper Extremity Assessment Upper Extremity Assessment: Overall WFL for tasks assessed   Lower Extremity Assessment Lower Extremity Assessment: Defer to PT evaluation       Communication Communication Communication: No difficulties   Cognition Arousal/Alertness: Awake/alert Behavior During Therapy: WFL for tasks assessed/performed Overall Cognitive Status: Within Functional Limits for tasks assessed  General Comments       Exercises       Shoulder Instructions      Home Living Family/patient expects to be discharged to:: Private residence Living Arrangements: Spouse/significant other Available Help at Discharge: Family;Available 24 hours/day   Home Access: Stairs to enter Entrance Stairs-Number of  Steps: 3-4   Home Layout: One level     Bathroom Shower/Tub: Occupational psychologist: Handicapped height     Home Equipment: Grab bars - toilet;Grab bars - tub/shower          Prior Functioning/Environment Level of Independence: Independent                 OT Problem List: Decreased strength;Decreased activity tolerance;Impaired balance (sitting and/or standing);Decreased knowledge of use of DME or AE;Decreased knowledge of precautions;Obesity;Pain   OT Treatment/Interventions: Self-care/ADL training;Therapeutic exercise;DME and/or AE instruction;Therapeutic activities;Patient/family education;Balance training    OT Goals(Current goals can be found in the care plan section) Acute Rehab OT Goals Patient Stated Goal: To get better  OT Goal Formulation: With patient/family Time For Goal Achievement: 01/27/16 Potential to Achieve Goals: Good ADL Goals Pt Will Perform Grooming: with min guard assist;sitting Pt Will Perform Upper Body Bathing: with min guard assist;sitting Pt Will Perform Lower Body Bathing: with mod assist;sit to/from stand;with adaptive equipment Pt Will Perform Upper Body Dressing: with min guard assist;sitting Pt Will Perform Lower Body Dressing: with mod assist;with adaptive equipment;sit to/from stand Pt Will Transfer to Toilet: with mod assist;stand pivot transfer;bedside commode  OT Frequency: Min 2X/week   Barriers to D/C:            Co-evaluation PT/OT/SLP Co-Evaluation/Treatment: Yes Reason for Co-Treatment: Complexity of the patient's impairments (multi-system involvement);For patient/therapist safety   OT goals addressed during session: ADL's and self-care;Strengthening/ROM      End of Session Nurse Communication: Mobility status  Activity Tolerance: Patient limited by pain Patient left: in bed;with call bell/phone within reach;with family/visitor present   Time: [id number] OT Time Calculation (min): 31 min Charges:  OT  General Charges $OT Visit: 1 Procedure OT Evaluation $OT Eval Moderate Complexity: 1 Procedure G-Codes:    Seville Brick M 18-Jan-2016, 1:34 PM

## 2016-01-13 NOTE — Progress Notes (Signed)
2nd unit of PRBCs completed at 0215. Temp 98.7. No adverse reactions noted. Pt stated he was feeling fine. Pt resting comfortably at this time. Will continue to monitor.

## 2016-01-13 NOTE — Progress Notes (Signed)
Suprapubic cath flushed with  67ml of sterile normal saline.  Flushed without difficulty.

## 2016-01-13 NOTE — Evaluation (Signed)
Physical Therapy Evaluation Patient Details Name: Christian [name] MRN: [id number] DOB: Aug 24, 1940 Today's Date: 01/13/2016   History of Present Illness  This 75 y.o. male admitted after tractor rolled into him and pinned him against a pole.  Pelvic CT showed:  Sagittal right sacral ala fracture extending across the right SI join to right iliac bone, Bilateral parasymphyseal fxs extending into pubic symphysis, Right inferior pubic ramus fracture, Urethral injury - s/p SP tube placement by IR  Clinical Impression  Patient presents with decreased independence with mobility due to pain and limited weight bearing on R LE in addition to edema in groin and he will benefit from skilled PT in the acute setting to allow return home following CIR level rehab stay.  Wife in room and able to assist at d/c. Limited to EOB this session due to pain. Continued low hemoglobin.  Will follow up next date to progress as tolerated if not going to surgery.     Follow Up Recommendations CIR    Equipment Recommendations  Other (comment) (TBA next venue)    Recommendations for Other Services Rehab consult     Precautions / Restrictions Precautions Precautions: Fall Restrictions Weight Bearing Restrictions: Yes RLE Weight Bearing: Touchdown weight bearing      Mobility  Bed Mobility Overal bed mobility: +2 for physical assistance;Needs Assistance Bed Mobility: Supine to Sit;Sit to Supine     Supine to sit: Max assist;+2 for physical assistance;HOB elevated Sit to supine: Mod assist;+2 for physical assistance;HOB elevated   General bed mobility comments: Pt requires assist for bil. LEs.  He is able to assist some  with moving his UB to seated position and with scooting to EOB.   Pt requires assist to move LEs onto bed, but able to shift hips back and control trunk into supine with HOB elevated.   Transfers                 General transfer comment: Unable to attempt this date due to Hbg 7.1 and pain    Ambulation/Gait                Stairs            Wheelchair Mobility    Modified Rankin (Stroke Patients Only)       Balance Overall balance assessment: Needs assistance Sitting-balance support: Feet supported Sitting balance-Leahy Scale: Poor Sitting balance - Comments: requires min guard assist due to pain                                     Pertinent Vitals/Pain Pain Assessment: 0-10 Pain Score: 5  Pain Location: R pelvis and back Pain Descriptors / Indicators: Grimacing;Guarding;Aching Pain Intervention(s): Limited activity within patient's tolerance;Monitored during session;Repositioned;Premedicated before session    Home Living Family/patient expects to be discharged to:: Private residence Living Arrangements: Spouse/significant other Available Help at Discharge: Family;Available 24 hours/day   Home Access: Stairs to enter Entrance Stairs-Rails: Right Entrance Stairs-Number of Steps: 3-4 Home Layout: One level Home Equipment: Grab bars - toilet;Grab bars - tub/shower      Prior Function Level of Independence: Independent               Hand Dominance   Dominant Hand: Right    Extremity/Trunk Assessment   Upper Extremity Assessment: Defer to OT evaluation           Lower Extremity Assessment: RLE deficits/detail;LLE deficits/detail  RLE Deficits / Details: AAROM grossly WFL with hip in abducted position, no attempt to adduct the leg due to pain and edema in groin; strength knee extension 3/5 LLE Deficits / Details: AAROM grossly WFL with hip in abducted position, no attempt to adduct the leg due to pain and edema in groin; strength hip flexion 3/5 with pain, knee extension 4-/5     Communication   Communication: No difficulties  Cognition Arousal/Alertness: Awake/alert Behavior During Therapy: WFL for tasks assessed/performed Overall Cognitive Status: Within Functional Limits for tasks assessed                       General Comments General comments (skin integrity, edema, etc.): VSS throughout with some c/o lightheadedness and report of h/o vertigo esp with head back    Exercises General Exercises - Lower Extremity Ankle Circles/Pumps: AROM;Both;5 reps;Supine Heel Slides: AAROM;Both;5 reps;Supine   Assessment/Plan    PT Assessment Patient needs continued PT services  PT Problem List Decreased mobility;Decreased knowledge of precautions;Decreased knowledge of use of DME;Decreased balance;Decreased activity tolerance;Decreased range of motion;Pain          PT Treatment Interventions Gait training;Functional mobility training;Balance training;Therapeutic exercise;Therapeutic activities;DME instruction;Patient/family education    PT Goals (Current goals can be found in the Care Plan section)  Acute Rehab PT Goals Patient Stated Goal: To get better  PT Goal Formulation: With patient/family Time For Goal Achievement: 01/24/16 Potential to Achieve Goals: Good    Frequency Min 5X/week   Barriers to discharge        Co-evaluation PT/OT/SLP Co-Evaluation/Treatment: Yes Reason for Co-Treatment: Complexity of the patient's impairments (multi-system involvement);For patient/therapist safety PT goals addressed during session: Mobility/safety with mobility;Strengthening/ROM OT goals addressed during session: ADL's and self-care;Strengthening/ROM       End of Session   Activity Tolerance: Other (comment);Patient limited by pain (and due to low hemoglobin) Patient left: in bed;with call bell/phone within reach;with family/visitor present Nurse Communication: Mobility status         Time: [id number] PT Time Calculation (min) (ACUTE ONLY): 31 min   Charges:   PT Evaluation $PT Eval Moderate Complexity: 1 Procedure     PT G CodesReginia Naas 2016/01/27, 4:06 PM  Magda Kiel, [place] 27-Jan-2016

## 2016-01-14 ENCOUNTER — Encounter (HOSPITAL_COMMUNITY): Admission: EM | Disposition: A | Discharge status: Rehabilitation Facility | Payer: Self-pay | Source: Home / Self Care

## 2016-01-14 LAB — BASIC METABOLIC PANEL
ANION GAP: 3 — AB (ref 5–15)
BUN: 12 mg/dL (ref 6–20)
CALCIUM: 7.9 mg/dL — AB (ref 8.9–10.3)
CO2: 24 mmol/L (ref 22–32)
Chloride: 112 mmol/L — ABNORMAL HIGH (ref 101–111)
Creatinine, Ser: 0.98 mg/dL (ref 0.61–1.24)
GFR calc Af Amer: >60 mL/min (ref >60)
Glucose, Bld: 192 mg/dL — ABNORMAL HIGH (ref 65–99)
POTASSIUM: 3.7 mmol/L (ref 3.5–5.1)
SODIUM: 139 mmol/L (ref 135–145)

## 2016-01-14 LAB — CBC WITH DIFFERENTIAL/PLATELET
Basophils Absolute: 0 10*3/uL (ref 0.0–0.1)
Basophils Relative: 0 %
EOS ABS: 0.1 10*3/uL (ref 0.0–0.7)
Eosinophils Relative: 2 %
HCT: 20.8 % — ABNORMAL LOW (ref 39.0–52.0)
Hemoglobin: 6.9 g/dL — CL (ref 13.0–17.0)
Lymphocytes Relative: 26 %
Lymphs Abs: 1.9 10*3/uL (ref 0.7–4.0)
MCH: 29.6 pg (ref 26.0–34.0)
MCHC: 33.2 g/dL (ref 30.0–36.0)
MCV: 89.3 fL (ref 78.0–100.0)
MONO ABS: 0.7 10*3/uL (ref 0.1–1.0)
Monocytes Relative: 10 %
NEUTROS PCT: 62 %
Neutro Abs: 4.7 10*3/uL (ref 1.7–7.7)
PLATELETS: 95 10*3/uL — AB (ref 150–400)
RBC: 2.33 MIL/uL — AB (ref 4.22–5.81)
RDW: 13.9 % (ref 11.5–15.5)
WBC: 7.4 10*3/uL (ref 4.0–10.5)

## 2016-01-14 LAB — BLOOD PRODUCT ORDER (VERBAL) VERIFICATION

## 2016-01-14 LAB — GLUCOSE, CAPILLARY
GLUCOSE-CAPILLARY: 217 mg/dL — AB (ref 65–99)
GLUCOSE-CAPILLARY: 257 mg/dL — AB (ref 65–99)
Glucose-Capillary: 224 mg/dL — ABNORMAL HIGH (ref 65–99)
Glucose-Capillary: 198 mg/dL — ABNORMAL HIGH (ref 65–99)

## 2016-01-14 SURGERY — OPEN REDUCTION INTERNAL FIXATION (ORIF) PELVIC FRACTURE
Anesthesia: General

## 2016-01-14 MED ORDER — FERROUS GLUCONATE 324 (38 FE) MG PO TABS
324.0000 mg | ORAL_TABLET | Freq: Two times a day (BID) | ORAL | Status: DC
  Administered 2016-01-14 – 2016-01-17 (×6): 324 mg via ORAL
  Filled 2016-01-14 (×8): qty 1

## 2016-01-14 MED ORDER — HYDROMORPHONE HCL 1 MG/ML IJ SOLN
0.5000 mg | INTRAMUSCULAR | Status: DC | PRN
  Administered 2016-01-14: 0.5 mg via INTRAVENOUS
  Administered 2016-01-16 (×2): 1 mg via INTRAVENOUS
  Administered 2016-01-16: 0.5 mg via INTRAVENOUS
  Administered 2016-01-17: 1 mg via INTRAVENOUS
  Filled 2016-01-14 (×6): qty 1

## 2016-01-14 MED ORDER — INSULIN DETEMIR 100 UNIT/ML ~~LOC~~ SOLN
6.0000 [IU] | Freq: Every day | SUBCUTANEOUS | Status: DC
  Administered 2016-01-14: 6 [IU] via SUBCUTANEOUS
  Filled 2016-01-14 (×2): qty 0.06

## 2016-01-14 MED ORDER — INFLUENZA VAC SPLIT QUAD 0.5 ML IM SUSY
0.5000 mL | PREFILLED_SYRINGE | INTRAMUSCULAR | Status: AC
  Administered 2016-01-15: 0.5 mL via INTRAMUSCULAR
  Filled 2016-01-14: qty 0.5

## 2016-01-14 MED ORDER — TAB-A-VITE/IRON PO TABS
1.0000 | ORAL_TABLET | Freq: Every day | ORAL | Status: DC
  Administered 2016-01-14 – 2016-01-17 (×3): 1 via ORAL
  Filled 2016-01-14 (×4): qty 1

## 2016-01-14 NOTE — Progress Notes (Signed)
CRITICAL VALUE ALERT  Critical value received:  Hgb 6.9  Date of notification:  01/14/16   Time of notification:  [id number]  Critical value read back:Yes.    Nurse who received alert:  Sylvan Cheese, RN   MD notified (1st page):  Dr. Kae Heller  Time of first page:  [phone]  Responding MD:  Dr. Kae Heller   Time MD responded:  [phone]  Obtained verbal orders to continue to monitor patient. No orders to transfuse at this time.

## 2016-01-14 NOTE — Progress Notes (Signed)
Trauma Service Note  Subjective: Patient is feeling well this AM.  Minimal complaints  Objective: Vital signs in last 24 hours: Temp:  [98.2 F (36.8 C)-99.3 F (37.4 C)] 99.3 F (37.4 C) (10/03 0639) Pulse Rate:  [80-99] 96 (10/03 0639) Resp:  [16-22] 22 (10/03 0639) BP: (130-164)/(61-73) 164/71 (10/03 0639) SpO2:  [89 %-99 %] 97 % (10/03 0639) Last BM Date: 01/13/16  Intake/Output from previous day: 10/02 0701 - 10/03 0700 In: 2297.1 [P.O.:1320; I.V.:972.1] Out: 2925 [Urine:2925] Intake/Output this shift: No intake/output data recorded.  General: No acute distress.  Blood sugars have been running high.  Lungs: Clear to auscultation  Abd: Soft, mildly distended, lots of flatus, no bowel movement.  Great bowel sounds.  Extremities: No changes.  No clinical signs or symptoms of DVT  Neuro: Intact  Lab Results: CBC   Recent Labs  01/13/16 0808 01/14/16 0426  WBC 8.1 7.4  HGB 7.1* 6.9*  HCT 21.2* 20.8*  PLT 89* 95*   BMET  Recent Labs  01/13/16 0808 01/14/16 0426  NA 138 139  K 4.1 3.7  CL 112* 112*  CO2 21* 24  GLUCOSE 198* 192*  BUN 16 12  CREATININE 1.08 0.98  CALCIUM 7.6* 7.9*   PT/INR No results for input(s): LABPROT, INR in the last 72 hours. ABG No results for input(s): PHART, HCO3 in the last 72 hours.  Invalid input(s): PCO2, PO2  Studies/Results: Dg Pelvis Comp Min 3v  Result Date: 01/13/2016 CLINICAL DATA:  The patient was pinned between a tractor and a pole on 01/10/2016 with pelvic fractures. EXAM: [facility] PELVIS - 3+ VIEW COMPARISON:  CT chest, abdomen and pelvis 01/10/2016 FINDINGS: A new suprapubic catheter is in place. Right sacral fracture is identified. Right superior and inferior pubic ramus fractures are seen. The patient's left parasymphyseal pubic fracture is not visible on this examination. The symphysis pubis and sacroiliac joints are unremarkable. IMPRESSION: Pelvic fractures without notable change in position or alignment as  described above. Electronically Signed   By: Inge Rise M.D.   On: 01/13/2016 20:12    Anti-infectives: Anti-infectives    None      Assessment/Plan: s/p Procedure(s): OPEN REDUCTION INTERNAL FIXATION (ORIF) PELVIC FRACTURE SI SCREWS TRANSSACRAL Transfer to Ortho with Tele Hemoglobin Is low, but because he is asymptomatic, will just recheck tomorrow.--no blood fro now. CGB has been high, Patient normally takes Levemir daily.  Will restart  LOS: 4 days   Kathryne Eriksson. Dahlia Bailiff, MD, FACS [phone] Trauma Surgeon 01/14/2016

## 2016-01-14 NOTE — Progress Notes (Signed)
Physical Therapy Treatment Patient Details Name: Christian [name] MRN: [id number] DOB: April 24, 1940 Today's Date: 01/14/2016    History of Present Illness This 75 y.o. male admitted after tractor rolled into him and pinned him against a pole.  Pelvic CT showed:  Sagittal right sacral ala fracture extending across the right SI join to right iliac bone, Bilateral parasymphyseal fxs extending into pubic symphysis, Right inferior pubic ramus fracture, Urethral injury - s/p SP tube placement by IR    PT Comments    Patient with severe pain and unable to keep weight off R LE during OOB transfer this session.  Will see again later for back to bed with nursing staff.  Feel he likely will benefit from stabilization surgery, (which seems to have been originally planned for later today, but now will wait till Thursday per RN).  Follow Up Recommendations  CIR     Equipment Recommendations  Other (comment) (TBA at next venue)    Recommendations for Other Services Rehab consult     Precautions / Restrictions Precautions Precautions: Fall Restrictions Weight Bearing Restrictions: Yes RLE Weight Bearing: Touchdown weight bearing    Mobility  Bed Mobility Overal bed mobility: Needs Assistance;+2 for physical assistance Bed Mobility: Supine to Sit     Supine to sit: Max assist;+2 for physical assistance;HOB elevated     General bed mobility comments: elevated HOB then assisted pt to scoot and bring legs off bed, second person to help lift trunk and stabilize  Transfers Overall transfer level: Needs assistance Equipment used: Rolling walker (2 wheeled) Transfers: Sit to/from [facility] Sit to Stand: Max assist;+2 physical assistance;From elevated surface Stand pivot transfers: Mod assist;+2 physical assistance       General transfer comment: lifting help and lowering help with +2 needed, pt scooted feet and used walker to pivot to chair with flexed posture, increased BOS  (wider than walker) and cues for TDWB, (but reports had to put weight in leg due to weakness in L LE)  Ambulation/Gait                 Stairs            Wheelchair Mobility    Modified Rankin (Stroke Patients Only)       Balance Overall balance assessment: Needs assistance Sitting-balance support: Bilateral upper extremity supported Sitting balance-Leahy Scale: Poor Sitting balance - Comments: heavy UE support and leaning back for pain relief with min to mod assist for balance                            Cognition Arousal/Alertness: Awake/alert Behavior During Therapy: WFL for tasks assessed/performed Overall Cognitive Status: Within Functional Limits for tasks assessed                      Exercises General Exercises - Lower Extremity Ankle Circles/Pumps: AROM;Both;10 reps;Supine Heel Slides: AAROM;Both;10 reps;Supine    General Comments General comments (skin integrity, edema, etc.): BP elevated siting EOB 181/81      Pertinent Vitals/Pain Pain Score: 10-Worst pain ever Pain Location: Hips and back with OOB mobility Pain Descriptors / Indicators: Sharp;Aching;Grimacing Pain Intervention(s): Monitored during session;Repositioned;Patient requesting pain meds-RN notified;Premedicated before session    Home Living                      Prior Function            PT Goals (current  goals can now be found in the care plan section) Progress towards PT goals: Progressing toward goals    Frequency    Min 5X/week      PT Plan Current plan remains appropriate    Co-evaluation             End of Session Equipment Utilized During Treatment: Gait belt Activity Tolerance: Patient limited by pain Patient left: in chair;with call bell/phone within reach     Time: 1033-1105 PT Time Calculation (min) (ACUTE ONLY): 32 min  Charges:  $Therapeutic Exercise: 8-22 mins $Therapeutic Activity: 8-22 mins                    G  Codes:      Reginia Naas 09-Feb-2016, 11:23 AM Magda Kiel, [place] 02-09-2016

## 2016-01-14 NOTE — Care Management Note (Signed)
Case Management Note  Patient Details  Name: Christian [name] MRN: [id number] Date of Birth: 09-Aug-1940  Subjective/Objective:  Pt admitted on 01/10/16 after a tractor rolled into him and pinned him against a pole.  Pt sustained a sagittal right sacral ala fracture extending across the right SI joint to right ilia bone, bilateral parasymphyseal fx extending into the pubic symphysis, Rt inferior pubic ramus fx, and urethral injury.  PTA, pt independent, lives with spouse.                    Action/Plan: PT/OT recommending CIR prior to discharge home.  Pt may require surgery prior to consult.  Will follow for discharge planning as pt progresses.    Expected Discharge Date:              Expected Discharge Plan:  [place]  In-House Referral:     Discharge planning Services  CM Consult  Post Acute Care Choice:    Choice offered to:     DME Arranged:    DME Agency:     HH Arranged:    [place] Agency:     Status of Service:  In process, will continue to follow  If discussed at Long Length of Stay Meetings, dates discussed:    Additional Comments:  Reinaldo Raddle, RN, BSN  Trauma/Neuro ICU Case Manager [phone]

## 2016-01-14 NOTE — Progress Notes (Signed)
Physical Therapy Treatment Patient Details Name: Christian [name] MRN: [id number] DOB: [birth year] Today's Date: 01/14/2016    History of Present Illness This 75 y.o. male admitted after tractor rolled into him and pinned him against a pole.  Pelvic CT showed:  Sagittal right sacral ala fracture extending across the right SI join to right iliac bone, Bilateral parasymphyseal fxs extending into pubic symphysis, Right inferior pubic ramus fracture, Urethral injury - s/p SP tube placement by IR    PT Comments    Patient limited by pain with mobility and severe fatigue, but managed back to bed with +2 assist.  Will need further skilled PT prior to d/c home.  Pt and spouse hopeful for surgical stabilization and improvement in pain.  Will continue to follow acutely.   Follow Up Recommendations  CIR     Equipment Recommendations  Other (comment) (TBA)    Recommendations for Other Services Rehab consult     Precautions / Restrictions Precautions Precautions: Fall Restrictions Weight Bearing Restrictions: Yes RLE Weight Bearing: Touchdown weight bearing    Mobility  Bed Mobility Overal bed mobility: Needs Assistance;+2 for physical assistance Bed Mobility: Sit to Supine     Supine to sit: Max assist;+2 for physical assistance;HOB elevated Sit to supine: +2 for physical assistance;Max assist   General bed mobility comments: assist to lower head/trunk and another to lift legs into bed  Transfers Overall transfer level: Needs assistance Equipment used: Rolling walker (2 wheeled) Transfers: Sit to/from [facility] Sit to Stand: Max assist;+2 physical assistance;From elevated surface Stand pivot transfers: Mod assist;+2 physical assistance       General transfer comment: heavy lifting help and increased time with cues to stand from chair, stood briefly while being assisted with perineal hygiene, then pivotal steps with RW to bed (still unable to maintain TDWB in R  LE)  Ambulation/Gait                 Stairs            Wheelchair Mobility    Modified Rankin (Stroke Patients Only)       Balance Overall balance assessment: Needs assistance Sitting-balance support: Bilateral upper extremity supported Sitting balance-Leahy Scale: Poor Sitting balance - Comments: UE support at EOB   Standing balance support: Bilateral upper extremity supported Standing balance-Leahy Scale: Poor Standing balance comment: UE suppport and assist for balance                    Cognition Arousal/Alertness: Awake/alert Behavior During Therapy: WFL for tasks assessed/performed Overall Cognitive Status: Within Functional Limits for tasks assessed                      Exercises General Exercises - Lower Extremity Ankle Circles/Pumps: AROM;Both;10 reps;Seated Heel Slides: AAROM;Both;5 reps;Seated    General Comments General comments (skin integrity, edema, etc.): BP elevated siting EOB 181/81      Pertinent Vitals/Pain Pain Score: 10-Worst pain ever Pain Location: pelvis with transfers back to bed Pain Descriptors / Indicators: Sharp;Aching;Grimacing Pain Intervention(s): Monitored during session;Limited activity within patient's tolerance;Repositioned    Home Living                      Prior Function            PT Goals (current goals can now be found in the care plan section) Progress towards PT goals: Progressing toward goals    Frequency    Min  5X/week      PT Plan Current plan remains appropriate    Co-evaluation PT/OT/SLP Co-Evaluation/Treatment: Yes Reason for Co-Treatment: Complexity of the patient's impairments (multi-system involvement);For patient/therapist safety PT goals addressed during session: Mobility/safety with mobility;Strengthening/ROM       End of Session Equipment Utilized During Treatment: Gait belt Activity Tolerance: Patient limited by fatigue;Patient limited by  pain Patient left: in bed;with call bell/phone within reach;with family/visitor present     Time: 1248-1300 PT Time Calculation (min) (ACUTE ONLY): 12 min  Charges:  1 Therapeutic Activity                    G Codes:      Reginia Naas 2016/01/30, 2:04 PM

## 2016-01-14 NOTE — Progress Notes (Signed)
Inpatient Rehabilitation  Per ortho, plan will be to allow pt. to work in therapies 2 more days before deciding if he will need surgery. Will await pt's progression and potential decision on surgery before recommending IP Rehab consult.  Please call if questions.  [facility] Admissions Coordinator Cell [phone] Office [phone]

## 2016-01-14 NOTE — Plan of Care (Signed)
Problem: Activity: Goal: Risk for activity intolerance will decrease Outcome: Progressing Sat up in chair for 2 hours today.

## 2016-01-15 DIAGNOSIS — W309XXA Contact with unspecified agricultural machinery, initial encounter: Secondary | ICD-10-CM

## 2016-01-15 DIAGNOSIS — D62 Acute posthemorrhagic anemia: Secondary | ICD-10-CM | POA: Diagnosis not present

## 2016-01-15 DIAGNOSIS — S3730XA Unspecified injury of urethra, initial encounter: Secondary | ICD-10-CM | POA: Diagnosis present

## 2016-01-15 DIAGNOSIS — N179 Acute kidney failure, unspecified: Secondary | ICD-10-CM | POA: Diagnosis not present

## 2016-01-15 LAB — CBC WITH DIFFERENTIAL/PLATELET
BASOS ABS: 0 10*3/uL (ref 0.0–0.1)
BASOS PCT: 0 %
Eosinophils Absolute: 0.2 10*3/uL (ref 0.0–0.7)
Eosinophils Relative: 3 %
HEMATOCRIT: 21.7 % — AB (ref 39.0–52.0)
HEMOGLOBIN: 7.1 g/dL — AB (ref 13.0–17.0)
Lymphocytes Relative: 25 %
Lymphs Abs: 2 10*3/uL (ref 0.7–4.0)
MCH: 29.7 pg (ref 26.0–34.0)
MCHC: 32.7 g/dL (ref 30.0–36.0)
MCV: 90.8 fL (ref 78.0–100.0)
MONOS PCT: 13 %
Monocytes Absolute: 1 10*3/uL (ref 0.1–1.0)
NEUTROS ABS: 4.6 10*3/uL (ref 1.7–7.7)
NEUTROS PCT: 59 %
Platelets: 129 10*3/uL — ABNORMAL LOW (ref 150–400)
RBC: 2.39 MIL/uL — AB (ref 4.22–5.81)
RDW: 13.6 % (ref 11.5–15.5)
WBC: 7.8 10*3/uL (ref 4.0–10.5)

## 2016-01-15 LAB — GLUCOSE, CAPILLARY
GLUCOSE-CAPILLARY: 182 mg/dL — AB (ref 65–99)
GLUCOSE-CAPILLARY: 286 mg/dL — AB (ref 65–99)
Glucose-Capillary: 123 mg/dL — ABNORMAL HIGH (ref 65–99)
Glucose-Capillary: 132 mg/dL — ABNORMAL HIGH (ref 65–99)

## 2016-01-15 MED ORDER — ENOXAPARIN SODIUM 30 MG/0.3ML ~~LOC~~ SOLN
30.0000 mg | Freq: Two times a day (BID) | SUBCUTANEOUS | Status: DC
  Administered 2016-01-15 – 2016-01-17 (×4): 30 mg via SUBCUTANEOUS
  Filled 2016-01-15 (×4): qty 0.3

## 2016-01-15 MED ORDER — INSULIN DETEMIR 100 UNIT/ML ~~LOC~~ SOLN
10.0000 [IU] | Freq: Every day | SUBCUTANEOUS | Status: DC
  Administered 2016-01-15 – 2016-01-16 (×2): 10 [IU] via SUBCUTANEOUS
  Filled 2016-01-15 (×3): qty 0.1

## 2016-01-15 MED ORDER — DOCUSATE SODIUM 100 MG PO CAPS
100.0000 mg | ORAL_CAPSULE | Freq: Two times a day (BID) | ORAL | Status: DC
  Administered 2016-01-15 – 2016-01-17 (×4): 100 mg via ORAL
  Filled 2016-01-15 (×4): qty 1

## 2016-01-15 MED ORDER — POLYETHYLENE GLYCOL 3350 17 G PO PACK
17.0000 g | PACK | Freq: Every day | ORAL | Status: DC
  Administered 2016-01-15 – 2016-01-17 (×2): 17 g via ORAL
  Filled 2016-01-15 (×2): qty 1

## 2016-01-15 MED ORDER — CEFAZOLIN SODIUM-DEXTROSE 2-4 GM/100ML-% IV SOLN
2.0000 g | Freq: Three times a day (TID) | INTRAVENOUS | Status: AC
  Administered 2016-01-15 – 2016-01-17 (×6): 2 g via INTRAVENOUS
  Filled 2016-01-15 (×7): qty 100

## 2016-01-15 NOTE — Progress Notes (Signed)
Physical Therapy Treatment Patient Details Name: Christian [name] MRN: [id number] DOB: [birth year] Today's Date: 01/15/2016    History of Present Illness This 75 y.o. male admitted after tractor rolled into him and pinned him against a pole.  Pelvic CT showed:  Sagittal right sacral ala fracture extending across the right SI join to right iliac bone, Bilateral parasymphyseal fxs extending into pubic symphysis, Right inferior pubic ramus fracture, Urethral injury - s/p SP tube placement by IR    PT Comments    Patient progressing with bed mobility and sitting balance.  Also with improved pain level in standing this session and less weight on R LE.  Will need CIR level rehab at d/c.  Follow Up Recommendations  CIR     Equipment Recommendations  Other (comment) (TBA at next venue)    Recommendations for Other Services       Precautions / Restrictions Precautions Precautions: Fall Restrictions Weight Bearing Restrictions: Yes RLE Weight Bearing: Touchdown weight bearing    Mobility  Bed Mobility Overal bed mobility: Needs Assistance;+2 for physical assistance Bed Mobility: Sit to Supine;Supine to Sit     Supine to sit: Mod assist;+2 for physical assistance Sit to supine: Max assist;+2 for physical assistance   General bed mobility comments: pt cued and assisted to scoot with L LE and UE's assist to lift trunk upright; then to supine assist for both legs and lowering trunk  Transfers Overall transfer level: Needs assistance Equipment used: Rolling walker (2 wheeled) Transfers: Sit to/from Stand Sit to Stand: Max assist;+2 physical assistance;From elevated surface         General transfer comment: Sit to stand only due to difficulty with TDWB on R. pushed up from bed with L UE  Ambulation/Gait                 Stairs            Wheelchair Mobility    Modified Rankin (Stroke Patients Only)       Balance Overall balance assessment: Needs  assistance Sitting-balance support: Bilateral upper extremity supported Sitting balance-Leahy Scale: Poor Sitting balance - Comments: leans back on UE's for support     Standing balance-Leahy Scale: Poor Standing balance comment: assist and UE support for balance; able to sit without support today for about 3-4 minutes                    Cognition Arousal/Alertness: Awake/alert Behavior During Therapy: WFL for tasks assessed/performed Overall Cognitive Status: Within Functional Limits for tasks assessed                      Exercises Total Joint Exercises Ankle Circles/Pumps: AROM;10 reps;Supine;Both Short Arc Quad: AROM;Left;10 reps;Supine Heel Slides: AAROM;Both;5 reps;10 reps;Supine    General Comments        Pertinent Vitals/Pain Pain Score: 7  Pain Descriptors / Indicators: Sore;Guarding;Grimacing Pain Intervention(s): Monitored during session;Limited activity within patient's tolerance;Repositioned    Home Living                      Prior Function            PT Goals (current goals can now be found in the care plan section) Progress towards PT goals: Progressing toward goals    Frequency    Min 5X/week      PT Plan Current plan remains appropriate    Co-evaluation  End of Session Equipment Utilized During Treatment: Gait belt Activity Tolerance: Patient limited by pain Patient left: in bed;with call bell/phone within reach     Time: 1153-1221 PT Time Calculation (min) (ACUTE ONLY): 28 min  Charges:  $Therapeutic Exercise: 8-22 mins $Therapeutic Activity: 8-22 mins                    G Codes:      Reginia Naas 02-10-2016, 12:45 PM  Magda Kiel, [place] 02-10-2016

## 2016-01-15 NOTE — Progress Notes (Signed)
OT Cancellation Note  Patient Details Name: Christian [name] MRN: [id number] DOB: 1940-05-31   Cancelled Treatment:    Reason Eval/Treat Not Completed: Patient at procedure or test/ unavailable   Lucille Passy, OTR/L [id number]  Lucille Passy M 01/15/2016, 12:59 PM
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
## 2016-01-15 NOTE — Care Management Important Message (Signed)
Important Message  Patient Details  Name: Christian [name] MRN: [id number] Date of Birth: 08/24/1940   Medicare Important Message Given:  Yes    [name] 01/15/2016, 11:04 AM
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
## 2016-01-15 NOTE — Progress Notes (Signed)
Inpatient Rehabilitation  Continuing to follow for IP Rehab need.  Note that plans are for patient to go to OR tomorrow.  If PT and OT recommend CIR please place a consult.  Call with questions.   Carmelia Roller., CCC/SLP Admission Coordinator  [place]  Cell [phone]

## 2016-01-15 NOTE — Clinical Social Work Note (Signed)
CSW following for discharge needs. Patient for surgery tomorrow.  Dayton Scrape, [place]

## 2016-01-15 NOTE — Anesthesia Preprocedure Evaluation (Addendum)
Anesthesia Evaluation  Patient identified by MRN, date of birth, ID band Patient awake    Reviewed: Allergy & Precautions, NPO status , Patient's Chart, lab work & pertinent test results  Airway Mallampati: II  TM Distance: >3 FB Neck ROM: Full    Dental no notable dental hx.    Pulmonary neg pulmonary ROS,    Pulmonary exam normal breath sounds clear to auscultation       Cardiovascular hypertension, Normal cardiovascular exam Rhythm:Regular Rate:Normal     Neuro/Psych negative neurological ROS  negative psych ROS   GI/Hepatic negative GI ROS, Neg liver ROS,   Endo/Other  negative endocrine ROSdiabetes  Renal/GU Renal disease     Musculoskeletal negative musculoskeletal ROS (+)   Abdominal   Peds negative pediatric ROS (+)  Hematology  (+) anemia ,   Anesthesia Other Findings   Reproductive/Obstetrics negative OB ROS                            Anesthesia Physical Anesthesia Plan  ASA: II  Anesthesia Plan: General   Post-op Pain Management:    Induction: Intravenous  Airway Management Planned: Oral ETT  Additional Equipment:   Intra-op Plan:   Post-operative Plan: Extubation in OR  Informed Consent: I have reviewed the patients History and Physical, chart, labs and discussed the procedure including the risks, benefits and alternatives for the proposed anesthesia with the patient or authorized representative who has indicated his/her understanding and acceptance.   Dental advisory given  Plan Discussed with: CRNA  Anesthesia Plan Comments:         Anesthesia Quick Evaluation

## 2016-01-16 ENCOUNTER — Inpatient Hospital Stay (HOSPITAL_COMMUNITY): Payer: Medicare Other

## 2016-01-16 ENCOUNTER — Encounter (HOSPITAL_COMMUNITY): Admission: EM | Disposition: A | Discharge status: Rehabilitation Facility | Payer: Self-pay | Source: Home / Self Care

## 2016-01-16 ENCOUNTER — Inpatient Hospital Stay (HOSPITAL_COMMUNITY): Payer: Medicare Other | Admitting: Anesthesiology

## 2016-01-16 ENCOUNTER — Encounter (HOSPITAL_COMMUNITY): Payer: Self-pay | Admitting: Certified Registered Nurse Anesthetist

## 2016-01-16 HISTORY — PX: SACRO-ILIAC PINNING: SHX5050

## 2016-01-16 LAB — PREPARE RBC (CROSSMATCH)

## 2016-01-16 LAB — CBC
HCT: 22.6 % — ABNORMAL LOW (ref 39.0–52.0)
Hemoglobin: 7.3 g/dL — ABNORMAL LOW (ref 13.0–17.0)
MCH: 29.6 pg (ref 26.0–34.0)
MCHC: 32.3 g/dL (ref 30.0–36.0)
MCV: 91.5 fL (ref 78.0–100.0)
PLATELETS: 167 10*3/uL (ref 150–400)
RBC: 2.47 MIL/uL — AB (ref 4.22–5.81)
RDW: 13.7 % (ref 11.5–15.5)
WBC: 9.6 10*3/uL (ref 4.0–10.5)

## 2016-01-16 LAB — GLUCOSE, CAPILLARY
GLUCOSE-CAPILLARY: 160 mg/dL — AB (ref 65–99)
GLUCOSE-CAPILLARY: 303 mg/dL — AB (ref 65–99)
GLUCOSE-CAPILLARY: 299 mg/dL — AB (ref 65–99)
Glucose-Capillary: 389 mg/dL — ABNORMAL HIGH (ref 65–99)

## 2016-01-16 SURGERY — PINNING, SACROILIAC JOINT, PERCUTANEOUS
Anesthesia: General

## 2016-01-16 MED ORDER — FENTANYL CITRATE (PF) 100 MCG/2ML IJ SOLN
INTRAMUSCULAR | Status: AC
  Filled 2016-01-16: qty 2

## 2016-01-16 MED ORDER — HYDROMORPHONE HCL 1 MG/ML IJ SOLN
INTRAMUSCULAR | Status: AC
  Filled 2016-01-16: qty 1

## 2016-01-16 MED ORDER — ROCURONIUM BROMIDE 10 MG/ML (PF) SYRINGE
PREFILLED_SYRINGE | INTRAVENOUS | Status: AC
  Filled 2016-01-16: qty 10

## 2016-01-16 MED ORDER — SUGAMMADEX SODIUM 200 MG/2ML IV SOLN
INTRAVENOUS | Status: AC
  Filled 2016-01-16: qty 2

## 2016-01-16 MED ORDER — PROMETHAZINE HCL 25 MG/ML IJ SOLN: 6.2500 mg | INTRAMUSCULAR | Status: DC | PRN

## 2016-01-16 MED ORDER — DEXAMETHASONE SODIUM PHOSPHATE 10 MG/ML IJ SOLN
INTRAMUSCULAR | Status: DC | PRN
  Administered 2016-01-16: 10 mg via INTRAVENOUS

## 2016-01-16 MED ORDER — MIDAZOLAM HCL 2 MG/2ML IJ SOLN
INTRAMUSCULAR | Status: DC | PRN
  Administered 2016-01-16 (×2): 1 mg via INTRAVENOUS

## 2016-01-16 MED ORDER — ARTIFICIAL TEARS OP OINT
TOPICAL_OINTMENT | OPHTHALMIC | Status: DC | PRN
  Administered 2016-01-16: 1 via OPHTHALMIC

## 2016-01-16 MED ORDER — ESMOLOL HCL 100 MG/10ML IV SOLN
INTRAVENOUS | Status: AC
  Filled 2016-01-16: qty 10

## 2016-01-16 MED ORDER — CEFAZOLIN SODIUM-DEXTROSE 2-4 GM/100ML-% IV SOLN: 2.0000 g | Freq: Three times a day (TID) | INTRAVENOUS | Status: DC

## 2016-01-16 MED ORDER — ARTIFICIAL TEARS OP OINT
TOPICAL_OINTMENT | OPHTHALMIC | Status: AC
  Filled 2016-01-16: qty 3.5

## 2016-01-16 MED ORDER — 0.9 % SODIUM CHLORIDE (POUR BTL) OPTIME
TOPICAL | Status: DC | PRN
  Administered 2016-01-16: 1000 mL

## 2016-01-16 MED ORDER — MEPERIDINE HCL 25 MG/ML IJ SOLN: 6.2500 mg | INTRAMUSCULAR | Status: DC | PRN

## 2016-01-16 MED ORDER — PROPOFOL 10 MG/ML IV BOLUS
INTRAVENOUS | Status: AC
  Filled 2016-01-16: qty 20

## 2016-01-16 MED ORDER — MIDAZOLAM HCL 2 MG/2ML IJ SOLN
INTRAMUSCULAR | Status: AC
  Filled 2016-01-16: qty 2

## 2016-01-16 MED ORDER — LACTATED RINGERS IV SOLN
INTRAVENOUS | Status: DC | PRN
  Administered 2016-01-16 (×3): via INTRAVENOUS

## 2016-01-16 MED ORDER — PHENYLEPHRINE 40 MCG/ML (10ML) SYRINGE FOR IV PUSH (FOR BLOOD PRESSURE SUPPORT)
PREFILLED_SYRINGE | INTRAVENOUS | Status: AC
  Filled 2016-01-16: qty 20

## 2016-01-16 MED ORDER — ONDANSETRON HCL 4 MG/2ML IJ SOLN
INTRAMUSCULAR | Status: AC
  Filled 2016-01-16: qty 2

## 2016-01-16 MED ORDER — GLUCERNA SHAKE PO LIQD
237.0000 mL | Freq: Three times a day (TID) | ORAL | Status: DC
  Administered 2016-01-17 (×2): 237 mL via ORAL

## 2016-01-16 MED ORDER — METOPROLOL TARTARATE 1 MG/ML SYRINGE (5ML)
Status: DC | PRN
  Administered 2016-01-16: 2.5 mg via INTRAVENOUS

## 2016-01-16 MED ORDER — SUGAMMADEX SODIUM 200 MG/2ML IV SOLN
INTRAVENOUS | Status: DC | PRN
  Administered 2016-01-16: 200 mg via INTRAVENOUS

## 2016-01-16 MED ORDER — ROCURONIUM BROMIDE 100 MG/10ML IV SOLN
INTRAVENOUS | Status: DC | PRN
  Administered 2016-01-16: 60 mg via INTRAVENOUS

## 2016-01-16 MED ORDER — PRO-STAT SUGAR FREE PO LIQD
30.0000 mL | Freq: Two times a day (BID) | ORAL | Status: DC
  Administered 2016-01-17: 30 mL via ORAL
  Filled 2016-01-16: qty 30

## 2016-01-16 MED ORDER — GLYCOPYRROLATE 0.2 MG/ML IV SOSY
PREFILLED_SYRINGE | INTRAVENOUS | Status: AC
  Filled 2016-01-16: qty 3

## 2016-01-16 MED ORDER — KETAMINE HCL-SODIUM CHLORIDE 100-0.9 MG/10ML-% IV SOSY
PREFILLED_SYRINGE | INTRAVENOUS | Status: AC
  Filled 2016-01-16: qty 10

## 2016-01-16 MED ORDER — ALBUTEROL SULFATE HFA 108 (90 BASE) MCG/ACT IN AERS
INHALATION_SPRAY | RESPIRATORY_TRACT | Status: DC | PRN
  Administered 2016-01-16 (×2): 2 via RESPIRATORY_TRACT

## 2016-01-16 MED ORDER — PHENYLEPHRINE HCL 10 MG/ML IJ SOLN
INTRAVENOUS | Status: DC | PRN
  Administered 2016-01-16: 100 ug/min via INTRAVENOUS

## 2016-01-16 MED ORDER — ZINC SULFATE 220 (50 ZN) MG PO CAPS
220.0000 mg | ORAL_CAPSULE | Freq: Every day | ORAL | Status: DC
  Administered 2016-01-16 – 2016-01-17 (×2): 220 mg via ORAL
  Filled 2016-01-16 (×2): qty 1

## 2016-01-16 MED ORDER — ESMOLOL HCL 100 MG/10ML IV SOLN
INTRAVENOUS | Status: DC | PRN
  Administered 2016-01-16: 30 mg via INTRAVENOUS

## 2016-01-16 MED ORDER — GLYCOPYRROLATE 0.2 MG/ML IJ SOLN
INTRAMUSCULAR | Status: DC | PRN
  Administered 2016-01-16 (×2): 0.1 mg via INTRAVENOUS

## 2016-01-16 MED ORDER — HYDROMORPHONE HCL 1 MG/ML IJ SOLN
0.2500 mg | INTRAMUSCULAR | Status: DC | PRN
  Administered 2016-01-16: 0.5 mg via INTRAVENOUS
  Administered 2016-01-16: 0.25 mg via INTRAVENOUS

## 2016-01-16 MED ORDER — LIDOCAINE 2% (20 MG/ML) 5 ML SYRINGE
INTRAMUSCULAR | Status: AC
  Filled 2016-01-16: qty 5

## 2016-01-16 MED ORDER — METOPROLOL TARTRATE 5 MG/5ML IV SOLN
INTRAVENOUS | Status: AC
  Filled 2016-01-16: qty 5

## 2016-01-16 MED ORDER — PHENYLEPHRINE HCL 10 MG/ML IJ SOLN
INTRAMUSCULAR | Status: DC | PRN
  Administered 2016-01-16: 160 ug via INTRAVENOUS
  Administered 2016-01-16: 120 ug via INTRAVENOUS
  Administered 2016-01-16 (×2): 160 ug via INTRAVENOUS
  Administered 2016-01-16: 200 ug via INTRAVENOUS

## 2016-01-16 MED ORDER — LIDOCAINE HCL (CARDIAC) 20 MG/ML IV SOLN
INTRAVENOUS | Status: DC | PRN
  Administered 2016-01-16: 100 mg via INTRATRACHEAL

## 2016-01-16 MED ORDER — VITAMIN C 500 MG PO TABS
500.0000 mg | ORAL_TABLET | Freq: Two times a day (BID) | ORAL | Status: DC
  Administered 2016-01-16 – 2016-01-17 (×3): 500 mg via ORAL
  Filled 2016-01-16 (×3): qty 1

## 2016-01-16 MED ORDER — ALBUTEROL SULFATE HFA 108 (90 BASE) MCG/ACT IN AERS
INHALATION_SPRAY | RESPIRATORY_TRACT | Status: AC
  Filled 2016-01-16: qty 6.7

## 2016-01-16 MED ORDER — INSULIN ASPART 100 UNIT/ML ~~LOC~~ SOLN
SUBCUTANEOUS | Status: AC
  Filled 2016-01-16: qty 1

## 2016-01-16 MED ORDER — SODIUM CHLORIDE 0.9 % IV SOLN
Freq: Once | INTRAVENOUS | Status: AC
  Administered 2016-01-16: 17:00:00 via INTRAVENOUS

## 2016-01-16 MED ORDER — PROPOFOL 10 MG/ML IV BOLUS
INTRAVENOUS | Status: DC | PRN
  Administered 2016-01-16: 50 mg via INTRAVENOUS
  Administered 2016-01-16: 100 mg via INTRAVENOUS
  Administered 2016-01-16: 150 mg via INTRAVENOUS
  Administered 2016-01-16: 50 mg via INTRAVENOUS

## 2016-01-16 MED ORDER — ALBUMIN HUMAN 5 % IV SOLN
INTRAVENOUS | Status: DC | PRN
  Administered 2016-01-16: 10:00:00 via INTRAVENOUS

## 2016-01-16 MED ORDER — FENTANYL CITRATE (PF) 100 MCG/2ML IJ SOLN
INTRAMUSCULAR | Status: DC | PRN
  Administered 2016-01-16: 100 ug via INTRAVENOUS

## 2016-01-16 MED ORDER — ONDANSETRON HCL 4 MG/2ML IJ SOLN
INTRAMUSCULAR | Status: DC | PRN
  Administered 2016-01-16: 4 mg via INTRAVENOUS

## 2016-01-16 MED ORDER — KETAMINE HCL 10 MG/ML IJ SOLN
INTRAMUSCULAR | Status: DC | PRN
  Administered 2016-01-16: 20 mg via INTRAVENOUS

## 2016-01-16 SURGICAL SUPPLY — 43 items
BRUSH SCRUB DISP (MISCELLANEOUS) ×4 IMPLANT
COVER SURGICAL LIGHT HANDLE (MISCELLANEOUS) ×2 IMPLANT
DRAPE C-ARM 42X72 X-RAY (DRAPES) ×2 IMPLANT
DRAPE C-ARMOR (DRAPES) ×2 IMPLANT
DRAPE LAPAROTOMY TRNSV 102X78 (DRAPE) ×2 IMPLANT
DRESSING ALLEVYN LIFE SACRUM (GAUZE/BANDAGES/DRESSINGS) ×2 IMPLANT
DRILL BIT CANN (BIT) ×2 IMPLANT
DRSG MEPILEX BORDER 4X4 (GAUZE/BANDAGES/DRESSINGS) ×2 IMPLANT
DRSG PAD ABDOMINAL 8X10 ST (GAUZE/BANDAGES/DRESSINGS) ×4 IMPLANT
ELECT REM PT RETURN 9FT ADLT (ELECTROSURGICAL) ×2
ELECTRODE REM PT RTRN 9FT ADLT (ELECTROSURGICAL) ×1 IMPLANT
GAUZE SPONGE 4X4 12PLY STRL (GAUZE/BANDAGES/DRESSINGS) ×2 IMPLANT
GAUZE XEROFORM 5X9 LF (GAUZE/BANDAGES/DRESSINGS) ×2 IMPLANT
GLOVE BIO SURGEON STRL SZ7.5 (GLOVE) ×2 IMPLANT
GLOVE BIO SURGEON STRL SZ8 (GLOVE) ×2 IMPLANT
GLOVE BIOGEL PI IND STRL 6 (GLOVE) ×1 IMPLANT
GLOVE BIOGEL PI IND STRL 6.5 (GLOVE) ×1 IMPLANT
GLOVE BIOGEL PI IND STRL 7.5 (GLOVE) ×1 IMPLANT
GLOVE BIOGEL PI IND STRL 8 (GLOVE) ×1 IMPLANT
GLOVE BIOGEL PI INDICATOR 6 (GLOVE) ×1
GLOVE BIOGEL PI INDICATOR 6.5 (GLOVE) ×1
GLOVE BIOGEL PI INDICATOR 7.5 (GLOVE) ×1
GLOVE BIOGEL PI INDICATOR 8 (GLOVE) ×1
GLOVE SURG SS PI 6.0 STRL IVOR (GLOVE) ×2 IMPLANT
GOWN STRL REUS W/ TWL LRG LVL3 (GOWN DISPOSABLE) ×2 IMPLANT
GOWN STRL REUS W/ TWL XL LVL3 (GOWN DISPOSABLE) ×1 IMPLANT
GOWN STRL REUS W/TWL LRG LVL3 (GOWN DISPOSABLE) ×2
GOWN STRL REUS W/TWL XL LVL3 (GOWN DISPOSABLE) ×1
GUIDEPIN 2.8X300 THRD TIP OIC (Screw) ×4 IMPLANT
KIT BASIN OR (CUSTOM PROCEDURE TRAY) ×2 IMPLANT
KIT ROOM TURNOVER OR (KITS) ×2 IMPLANT
NS IRRIG 1000ML POUR BTL (IV SOLUTION) ×2 IMPLANT
OIC GUIDE PIN  2.8 X 450 ×2 IMPLANT
PACK GENERAL/GYN (CUSTOM PROCEDURE TRAY) ×2 IMPLANT
PAD ARMBOARD 7.5X6 YLW CONV (MISCELLANEOUS) ×4 IMPLANT
SCREW CANN 7.3X160MM 16MM (Screw) ×2 IMPLANT
SCREW CANN 7.3X180MM 16MM (Screw) ×2 IMPLANT
STAPLER VISISTAT 35W (STAPLE) ×2 IMPLANT
SUT ETHILON 3 0 PS 1 (SUTURE) ×2 IMPLANT
SUT VIC AB 2-0 FS1 27 (SUTURE) ×2 IMPLANT
TOWEL OR 17X24 6PK STRL BLUE (TOWEL DISPOSABLE) ×2 IMPLANT
TOWEL OR 17X26 10 PK STRL BLUE (TOWEL DISPOSABLE) ×4 IMPLANT
WASHER OIC 13MM 6 PACK (Screw) ×4 IMPLANT

## 2016-01-16 NOTE — Transfer of Care (Signed)
Immediate Anesthesia Transfer of Care Note  Patient: Christian [name]  Procedure(s) Performed: Procedure(s): TRANS-SACRO-ILIAC PINNING (N/A)  Patient Location: PACU  Anesthesia Type:General  Level of Consciousness: awake, alert , oriented and patient cooperative  Airway & Oxygen Therapy: Patient Spontanous Breathing, Patient connected to nasal cannula oxygen and Patient connected to face mask oxygen  Post-op Assessment: Report given to RN, Post -op Vital signs reviewed and stable, Patient moving all extremities X 4 and Patient able to stick tongue midline  Post vital signs: Reviewed and stable  Last Vitals:  Vitals:   01/16/16 0211 01/16/16 0600  BP: (!) 157/66 (!) 157/65  Pulse: 97 93  Resp: 18 18  Temp: (!) 38.3 C 36.8 C    Last Pain:  Vitals:   01/16/16 0600  TempSrc: Oral  PainSc:       Patients Stated Pain Goal: 3 ([id number])  Complications: No apparent anesthesia complications

## 2016-01-16 NOTE — Progress Notes (Signed)
Sacral dressing changed. Patient has a stage 2 pressure ulcer to his sacral area with a small amount of bloody drainage noted Cariana Karge A Bertine Schlottman, RN
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
## 2016-01-16 NOTE — Brief Op Note (Signed)
01/10/2016 - 01/16/2016  4:49 PM  PATIENT:  Christian [name]  75 y.o. male  PRE-OPERATIVE DIAGNOSIS:  Unstable pelvic ring fractures  POST-OPERATIVE DIAGNOSIS: Unstable pelvic ring fractures  PROCEDURE:  Procedure(s): SACROILIAC SCREW FIXATION ACROSS THE RIGHT AND LEFT AT S1 AND S2  SURGEON:  Surgeon(s) and Role:    * Altamese , MD - Primary  ASSISTANTS: none   ANESTHESIA:   general  EBL:  Total I/O In: 2250 [I.V.:2000; IV Piggyback:250] Out: 850 [Urine:800; Blood:50]  BLOOD ADMINISTERED:none  DRAINS: none   LOCAL MEDICATIONS USED:  NONE  SPECIMEN:  No Specimen  DISPOSITION OF SPECIMEN:  N/A  COUNTS:  YES  TOURNIQUET:  * No tourniquets in log *  DICTATION: .Other Dictation: Dictation Number [id number]  PLAN OF CARE: Admit to inpatient   PATIENT DISPOSITION:  PACU - hemodynamically stable.   Delay start of Pharmacological VTE agent (>24hrs) due to surgical blood loss or risk of bleeding: no

## 2016-01-16 NOTE — Progress Notes (Signed)
Inpatient Diabetes Program Recommendations  AACE/ADA: New Consensus Statement on Inpatient Glycemic Control (2015)  Target Ranges:  Prepandial:   less than 140 mg/dL      Peak postprandial:   less than 180 mg/dL (1-2 hours)      Critically ill patients:  140 - 180 mg/dL   Lab Results  Component Value Date   GLUCAP 160 (H) 01/16/2016   HGBA1C 7.9 (H) 01/11/2016    Review of Glycemic Control  Results for MARKALE, FINNER (MRN [id number]) as of 01/16/2016 10:22  Ref. Range 01/15/2016 08:34 01/15/2016 12:07 01/15/2016 17:31 01/15/2016 20:54 01/16/2016 05:59  Glucose-Capillary Latest Ref Range: 65 - 99 mg/dL 182 (H) 286 (H) 123 (H) 132 (H) 160 (H)    Home DM Meds: Metformin 1000 mg bid                             Glipizide 10 mg AM/ 5 mg PM  Current Insulin Orders: Metformin 1000 mg bid, Glipizide 10 mg AM/ 5 mg PM, Novolog Moderate Correction Scale 0-15 units tid, Novolog 0-5 units qhs, Novolog 4 units tid, Levemir 10 units qhs   Anticipate elevated CBG this afternoon after steroids given.  Agree with current medications for blood sugar management.   Will follow.  Gentry Fitz, RN, BA, MHA, CDE Diabetes Coordinator Inpatient Diabetes Program  [phone] (Team Pager) [phone] ([place]) 01/16/2016 10:28 AM

## 2016-01-16 NOTE — Progress Notes (Signed)
Attempted to see pt this am but pt in surgery.  Will attempt back as schedule allows.  MD...please provide new OT order to proceed after surgery.  Jinger Neighbors, [place] [phone]

## 2016-01-16 NOTE — Anesthesia Procedure Notes (Signed)
Procedure Name: Intubation Date/Time: 01/16/2016 8:21 AM Performed by: Nolon Nations Pre-anesthesia Checklist: Patient identified, Emergency Drugs available, Suction available and Patient being monitored Patient Re-evaluated:Patient Re-evaluated prior to inductionOxygen Delivery Method: Circle system utilized Preoxygenation: Pre-oxygenation with 100% oxygen Intubation Type: IV induction Ventilation: Mask ventilation without difficulty Laryngoscope Size: Mac, 3 and Glidescope Grade View: Grade IV Tube type: Oral Number of attempts: 3 (DLx3.Marland KitchenMarland Kitchenfirst MAC3, then MAC 3 with bougie, and then glidescope) Airway Equipment and Method: Stylet,  Bougie stylet and Video-laryngoscopy Placement Confirmation: ETT inserted through vocal cords under direct vision,  positive ETCO2 and breath sounds checked- equal and bilateral Secured at: 24 cm Tube secured with: Tape Dental Injury: Teeth and Oropharynx as per pre-operative assessment

## 2016-01-16 NOTE — Progress Notes (Signed)
Dr Marcelino Scot here-aware pt's LOC, agitation when awake, c/o "my hip is killing me".

## 2016-01-16 NOTE — Progress Notes (Signed)
Pt transported via bed with Lauren, NT. Patient on telemetry. VSS throughout. Patient in room 5N17 with receiving RN and hooked to their telemetry monitors. Patients wife and belongings at bedside.  Levon Hedger, RN

## 2016-01-16 NOTE — Anesthesia Postprocedure Evaluation (Signed)
Anesthesia Post Note  Patient: Christian [name]  Procedure(s) Performed: Procedure(s) (LRB): TRANS-SACRO-ILIAC PINNING (N/A)  Patient location during evaluation: PACU Anesthesia Type: General Level of consciousness: sedated, patient cooperative and responds to stimulation Pain management: pain level controlled Vital Signs Assessment: post-procedure vital signs reviewed and stable Respiratory status: spontaneous breathing Cardiovascular status: stable Anesthetic complications: no Comments: Pt very sleepy, but denies pain. Will send on continuous SpO2    Last Vitals:  Vitals:   01/16/16 1200 01/16/16 1226  BP:    Pulse: 92 91  Resp: 15 15  Temp:  36.7 C    Last Pain:  Vitals:   01/16/16 1200  TempSrc:   PainSc: 0-No pain                 Nolon Nations

## 2016-01-16 NOTE — Progress Notes (Signed)
Pt is very agitated when awake, does not answer questions or follow commands, but MAE with good strength. Will continue to monitor closely.

## 2016-01-16 NOTE — Progress Notes (Signed)
Pt calmer, opens eyes when stimulated, denies pain, follows commands. Dr Lissa Hoard here-OK to tx pt back to 5N

## 2016-01-16 NOTE — Progress Notes (Signed)
I discussed with the patient the risks and benefits of surgery, including the possibility of infection, foot drop and nerve injury, vessel injury, wound breakdown, arthritis, symptomatic hardware, DVT/ PE, loss of motion, and need for further surgery among others.  He understood these risks and wished to proceed.  Altamese [place], MD [facility] [phone] (p)

## 2016-01-17 ENCOUNTER — Inpatient Hospital Stay (HOSPITAL_COMMUNITY)
Admission: RE | Admit: 2016-01-17 | Discharge: 2016-02-12 | DRG: 501 | Disposition: A | Discharge status: Home Health | Payer: Medicare Other | Source: Intra-hospital | Attending: Physical Medicine & Rehabilitation | Admitting: Physical Medicine & Rehabilitation

## 2016-01-17 DIAGNOSIS — E785 Hyperlipidemia, unspecified: Secondary | ICD-10-CM | POA: Diagnosis present

## 2016-01-17 DIAGNOSIS — W309XXA Contact with unspecified agricultural machinery, initial encounter: Secondary | ICD-10-CM

## 2016-01-17 DIAGNOSIS — R3989 Other symptoms and signs involving the genitourinary system: Secondary | ICD-10-CM | POA: Diagnosis not present

## 2016-01-17 DIAGNOSIS — R109 Unspecified abdominal pain: Secondary | ICD-10-CM

## 2016-01-17 DIAGNOSIS — R110 Nausea: Secondary | ICD-10-CM

## 2016-01-17 DIAGNOSIS — S3982XD Other specified injuries of lower back, subsequent encounter: Secondary | ICD-10-CM

## 2016-01-17 DIAGNOSIS — W309XXS Contact with unspecified agricultural machinery, sequela: Secondary | ICD-10-CM | POA: Diagnosis not present

## 2016-01-17 DIAGNOSIS — Z79899 Other long term (current) drug therapy: Secondary | ICD-10-CM | POA: Diagnosis not present

## 2016-01-17 DIAGNOSIS — S3730XS Unspecified injury of urethra, sequela: Secondary | ICD-10-CM | POA: Diagnosis not present

## 2016-01-17 DIAGNOSIS — L89153 Pressure ulcer of sacral region, stage 3: Secondary | ICD-10-CM | POA: Diagnosis not present

## 2016-01-17 DIAGNOSIS — I10 Essential (primary) hypertension: Secondary | ICD-10-CM

## 2016-01-17 DIAGNOSIS — W3089XD Contact with other specified agricultural machinery, subsequent encounter: Secondary | ICD-10-CM | POA: Diagnosis not present

## 2016-01-17 DIAGNOSIS — L89152 Pressure ulcer of sacral region, stage 2: Secondary | ICD-10-CM | POA: Diagnosis not present

## 2016-01-17 DIAGNOSIS — R112 Nausea with vomiting, unspecified: Secondary | ICD-10-CM | POA: Diagnosis not present

## 2016-01-17 DIAGNOSIS — S3739XD Other injury of urethra, subsequent encounter: Secondary | ICD-10-CM

## 2016-01-17 DIAGNOSIS — G8918 Other acute postprocedural pain: Secondary | ICD-10-CM

## 2016-01-17 DIAGNOSIS — M79609 Pain in unspecified limb: Secondary | ICD-10-CM | POA: Diagnosis not present

## 2016-01-17 DIAGNOSIS — S329XXD Fracture of unspecified parts of lumbosacral spine and pelvis, subsequent encounter for fracture with routine healing: Secondary | ICD-10-CM | POA: Diagnosis not present

## 2016-01-17 DIAGNOSIS — K5909 Other constipation: Secondary | ICD-10-CM | POA: Diagnosis not present

## 2016-01-17 DIAGNOSIS — K5903 Drug induced constipation: Secondary | ICD-10-CM

## 2016-01-17 DIAGNOSIS — S3282XS Multiple fractures of pelvis without disruption of pelvic ring, sequela: Secondary | ICD-10-CM | POA: Diagnosis not present

## 2016-01-17 DIAGNOSIS — S31000A Unspecified open wound of lower back and pelvis without penetration into retroperitoneum, initial encounter: Secondary | ICD-10-CM

## 2016-01-17 DIAGNOSIS — S3282XA Multiple fractures of pelvis without disruption of pelvic ring, initial encounter for closed fracture: Secondary | ICD-10-CM

## 2016-01-17 DIAGNOSIS — IMO0002 Reserved for concepts with insufficient information to code with codable children: Secondary | ICD-10-CM

## 2016-01-17 DIAGNOSIS — L89159 Pressure ulcer of sacral region, unspecified stage: Secondary | ICD-10-CM | POA: Diagnosis not present

## 2016-01-17 DIAGNOSIS — R52 Pain, unspecified: Secondary | ICD-10-CM

## 2016-01-17 DIAGNOSIS — E1142 Type 2 diabetes mellitus with diabetic polyneuropathy: Secondary | ICD-10-CM | POA: Diagnosis not present

## 2016-01-17 DIAGNOSIS — S32810S Multiple fractures of pelvis with stable disruption of pelvic ring, sequela: Secondary | ICD-10-CM | POA: Diagnosis not present

## 2016-01-17 DIAGNOSIS — Z7984 Long term (current) use of oral hypoglycemic drugs: Secondary | ICD-10-CM | POA: Diagnosis not present

## 2016-01-17 DIAGNOSIS — D62 Acute posthemorrhagic anemia: Secondary | ICD-10-CM | POA: Diagnosis present

## 2016-01-17 LAB — CBC
HEMATOCRIT: 20.1 % — AB (ref 39.0–52.0)
Hemoglobin: 6.3 g/dL — CL (ref 13.0–17.0)
MCH: 29.3 pg (ref 26.0–34.0)
MCHC: 31.3 g/dL (ref 30.0–36.0)
MCV: 93.5 fL (ref 78.0–100.0)
PLATELETS: 217 10*3/uL (ref 150–400)
RBC: 2.15 MIL/uL — ABNORMAL LOW (ref 4.22–5.81)
RDW: 14.5 % (ref 11.5–15.5)
WBC: 12.8 10*3/uL — AB (ref 4.0–10.5)

## 2016-01-17 LAB — GLUCOSE, CAPILLARY
GLUCOSE-CAPILLARY: 233 mg/dL — AB (ref 65–99)
GLUCOSE-CAPILLARY: 146 mg/dL — AB (ref 65–99)
Glucose-Capillary: 155 mg/dL — ABNORMAL HIGH (ref 65–99)
Glucose-Capillary: 157 mg/dL — ABNORMAL HIGH (ref 65–99)

## 2016-01-17 LAB — CREATININE, SERUM
Creatinine, Ser: 1.05 mg/dL (ref 0.61–1.24)
GFR calc Af Amer: >60 mL/min (ref >60)
GFR calc non Af Amer: >60 mL/min (ref >60)

## 2016-01-17 MED ORDER — GABAPENTIN 300 MG PO CAPS
300.0000 mg | ORAL_CAPSULE | Freq: Three times a day (TID) | ORAL | Status: DC
  Administered 2016-01-17 – 2016-02-12 (×76): 300 mg via ORAL
  Filled 2016-01-17 (×76): qty 1

## 2016-01-17 MED ORDER — ENOXAPARIN SODIUM 30 MG/0.3ML ~~LOC~~ SOLN: 30.0000 mg | Freq: Two times a day (BID) | SUBCUTANEOUS | Status: DC

## 2016-01-17 MED ORDER — FERROUS GLUCONATE 324 (38 FE) MG PO TABS
324.0000 mg | ORAL_TABLET | Freq: Two times a day (BID) | ORAL | Status: DC
  Administered 2016-01-17 – 2016-02-12 (×50): 324 mg via ORAL
  Filled 2016-01-17 (×53): qty 1

## 2016-01-17 MED ORDER — POLYETHYLENE GLYCOL 3350 17 G PO PACK
17.0000 g | PACK | Freq: Every day | ORAL | Status: DC
  Administered 2016-01-18 – 2016-01-19 (×2): 17 g via ORAL
  Filled 2016-01-17 (×2): qty 1

## 2016-01-17 MED ORDER — FLUTICASONE PROPIONATE 50 MCG/ACT NA SUSP
2.0000 | Freq: Every day | NASAL | Status: DC
  Administered 2016-01-18 – 2016-02-12 (×21): 2 via NASAL
  Filled 2016-01-17: qty 16

## 2016-01-17 MED ORDER — INSULIN DETEMIR 100 UNIT/ML ~~LOC~~ SOLN
15.0000 [IU] | Freq: Every day | SUBCUTANEOUS | Status: DC
  Filled 2016-01-17: qty 0.15

## 2016-01-17 MED ORDER — VITAMIN C 500 MG PO TABS
500.0000 mg | ORAL_TABLET | Freq: Two times a day (BID) | ORAL | Status: DC
  Administered 2016-01-17 – 2016-02-09 (×46): 500 mg via ORAL
  Filled 2016-01-17 (×46): qty 1

## 2016-01-17 MED ORDER — DOCUSATE SODIUM 100 MG PO CAPS
100.0000 mg | ORAL_CAPSULE | Freq: Two times a day (BID) | ORAL | Status: DC
  Administered 2016-01-17 – 2016-01-20 (×6): 100 mg via ORAL
  Filled 2016-01-17 (×6): qty 1

## 2016-01-17 MED ORDER — ACETAMINOPHEN 325 MG PO TABS
650.0000 mg | ORAL_TABLET | ORAL | Status: DC | PRN
  Administered 2016-01-17 – 2016-01-30 (×2): 650 mg via ORAL
  Filled 2016-01-17 (×2): qty 2

## 2016-01-17 MED ORDER — PANTOPRAZOLE SODIUM 40 MG PO TBEC
40.0000 mg | DELAYED_RELEASE_TABLET | Freq: Two times a day (BID) | ORAL | Status: DC
  Administered 2016-01-17 – 2016-02-09 (×46): 40 mg via ORAL
  Filled 2016-01-17 (×47): qty 1

## 2016-01-17 MED ORDER — TRAMADOL HCL 50 MG PO TABS
50.0000 mg | ORAL_TABLET | Freq: Four times a day (QID) | ORAL | Status: DC | PRN
  Administered 2016-01-17: 50 mg via ORAL
  Administered 2016-01-18 (×2): 100 mg via ORAL
  Administered 2016-01-19 (×2): 50 mg via ORAL
  Administered 2016-01-20 (×2): 100 mg via ORAL
  Administered 2016-01-21 (×2): 50 mg via ORAL
  Administered 2016-01-22: 100 mg via ORAL
  Administered 2016-01-22: 50 mg via ORAL
  Administered 2016-01-22: 100 mg via ORAL
  Administered 2016-01-23: 50 mg via ORAL
  Filled 2016-01-17: qty 1
  Filled 2016-01-17 (×2): qty 2
  Filled 2016-01-17: qty 1
  Filled 2016-01-17 (×2): qty 2
  Filled 2016-01-17 (×2): qty 1
  Filled 2016-01-17 (×5): qty 2
  Filled 2016-01-17 (×2): qty 1

## 2016-01-17 MED ORDER — TAB-A-VITE/IRON PO TABS
1.0000 | ORAL_TABLET | Freq: Every day | ORAL | Status: DC
  Administered 2016-01-18 – 2016-02-09 (×23): 1 via ORAL
  Filled 2016-01-17 (×24): qty 1

## 2016-01-17 MED ORDER — SORBITOL 70 % SOLN
30.0000 mL | Freq: Every day | Status: DC | PRN
  Administered 2016-01-18 – 2016-01-27 (×3): 30 mL via ORAL
  Filled 2016-01-17 (×2): qty 30

## 2016-01-17 MED ORDER — METFORMIN HCL 500 MG PO TABS
1000.0000 mg | ORAL_TABLET | Freq: Two times a day (BID) | ORAL | Status: DC
  Administered 2016-01-17 – 2016-02-12 (×48): 1000 mg via ORAL
  Filled 2016-01-17 (×49): qty 2

## 2016-01-17 MED ORDER — ENOXAPARIN SODIUM 30 MG/0.3ML ~~LOC~~ SOLN
30.0000 mg | Freq: Two times a day (BID) | SUBCUTANEOUS | Status: AC
  Administered 2016-01-17 – 2016-02-09 (×45): 30 mg via SUBCUTANEOUS
  Filled 2016-01-17 (×46): qty 0.3

## 2016-01-17 MED ORDER — PRO-STAT SUGAR FREE PO LIQD: 30.0000 mL | Freq: Every day | ORAL | Status: DC

## 2016-01-17 MED ORDER — CALCIUM CARBONATE 1250 (500 CA) MG PO TABS
1.0000 | ORAL_TABLET | Freq: Three times a day (TID) | ORAL | Status: DC
  Administered 2016-01-18 – 2016-02-12 (×71): 500 mg via ORAL
  Filled 2016-01-17 (×79): qty 1

## 2016-01-17 MED ORDER — AMLODIPINE BESYLATE 5 MG PO TABS
7.5000 mg | ORAL_TABLET | Freq: Every day | ORAL | Status: DC
  Administered 2016-01-18 – 2016-02-02 (×15): 7.5 mg via ORAL
  Filled 2016-01-17 (×15): qty 1

## 2016-01-17 MED ORDER — GLIPIZIDE 5 MG PO TABS
10.0000 mg | ORAL_TABLET | Freq: Every day | ORAL | Status: DC
  Administered 2016-01-18 – 2016-02-12 (×23): 10 mg via ORAL
  Filled 2016-01-17 (×26): qty 2

## 2016-01-17 MED ORDER — TRAMADOL HCL 50 MG PO TABS
50.0000 mg | ORAL_TABLET | Freq: Four times a day (QID) | ORAL | Status: DC | PRN
  Administered 2016-01-17: 50 mg via ORAL
  Filled 2016-01-17: qty 1

## 2016-01-17 MED ORDER — GLUCERNA SHAKE PO LIQD: 237.0000 mL | Freq: Two times a day (BID) | ORAL | Status: DC

## 2016-01-17 MED ORDER — INSULIN DETEMIR 100 UNIT/ML ~~LOC~~ SOLN
15.0000 [IU] | Freq: Every day | SUBCUTANEOUS | Status: DC
  Administered 2016-01-17 – 2016-01-31 (×13): 15 [IU] via SUBCUTANEOUS
  Administered 2016-02-01: 7 [IU] via SUBCUTANEOUS
  Administered 2016-02-02 – 2016-02-11 (×10): 15 [IU] via SUBCUTANEOUS
  Filled 2016-01-17 (×28): qty 0.15

## 2016-01-17 MED ORDER — INSULIN ASPART 100 UNIT/ML ~~LOC~~ SOLN
0.0000 [IU] | Freq: Three times a day (TID) | SUBCUTANEOUS | Status: DC
  Administered 2016-01-17: 3 [IU] via SUBCUTANEOUS

## 2016-01-17 MED ORDER — INSULIN ASPART 100 UNIT/ML ~~LOC~~ SOLN
4.0000 [IU] | Freq: Three times a day (TID) | SUBCUTANEOUS | Status: DC
  Administered 2016-01-18 – 2016-02-12 (×46): 4 [IU] via SUBCUTANEOUS

## 2016-01-17 MED ORDER — ZINC SULFATE 220 (50 ZN) MG PO CAPS
220.0000 mg | ORAL_CAPSULE | Freq: Every day | ORAL | Status: DC
  Administered 2016-01-18 – 2016-02-09 (×22): 220 mg via ORAL
  Filled 2016-01-17 (×24): qty 1

## 2016-01-17 MED ORDER — GLIPIZIDE 5 MG PO TABS
5.0000 mg | ORAL_TABLET | Freq: Every day | ORAL | Status: DC
  Administered 2016-01-17 – 2016-02-11 (×25): 5 mg via ORAL
  Filled 2016-01-17 (×27): qty 1

## 2016-01-17 MED ORDER — MUPIROCIN CALCIUM 2 % EX CREA
TOPICAL_CREAM | Freq: Every day | CUTANEOUS | Status: DC
  Administered 2016-01-17: 14:00:00 via TOPICAL
  Filled 2016-01-17: qty 15

## 2016-01-17 MED ORDER — ATORVASTATIN CALCIUM 80 MG PO TABS
80.0000 mg | ORAL_TABLET | Freq: Every day | ORAL | Status: DC
  Administered 2016-01-18 – 2016-02-12 (×26): 80 mg via ORAL
  Filled 2016-01-17 (×25): qty 1

## 2016-01-17 MED ORDER — MUPIROCIN CALCIUM 2 % EX CREA
TOPICAL_CREAM | Freq: Every day | CUTANEOUS | Status: DC
  Administered 2016-01-18 – 2016-01-19 (×2): via TOPICAL
  Filled 2016-01-17: qty 15

## 2016-01-17 MED ORDER — INSULIN ASPART 100 UNIT/ML ~~LOC~~ SOLN
0.0000 [IU] | Freq: Three times a day (TID) | SUBCUTANEOUS | Status: DC
  Administered 2016-01-17: 4 [IU] via SUBCUTANEOUS
  Administered 2016-01-18: 3 [IU] via SUBCUTANEOUS
  Administered 2016-01-19 – 2016-01-20 (×3): 4 [IU] via SUBCUTANEOUS
  Administered 2016-01-20: 3 [IU] via SUBCUTANEOUS
  Administered 2016-01-20 – 2016-01-21 (×2): 4 [IU] via SUBCUTANEOUS
  Administered 2016-01-22 – 2016-01-23 (×2): 3 [IU] via SUBCUTANEOUS
  Administered 2016-01-24: 7 [IU] via SUBCUTANEOUS
  Administered 2016-01-24: 4 [IU] via SUBCUTANEOUS
  Administered 2016-01-25: 7 [IU] via SUBCUTANEOUS
  Administered 2016-01-25: 4 [IU] via SUBCUTANEOUS
  Administered 2016-01-26 (×2): 3 [IU] via SUBCUTANEOUS
  Administered 2016-01-27: 4 [IU] via SUBCUTANEOUS
  Administered 2016-01-27 – 2016-01-28 (×2): 3 [IU] via SUBCUTANEOUS
  Administered 2016-01-28 (×2): 4 [IU] via SUBCUTANEOUS
  Administered 2016-01-29 (×2): 3 [IU] via SUBCUTANEOUS
  Administered 2016-01-31: 7 [IU] via SUBCUTANEOUS
  Administered 2016-01-31: 3 [IU] via SUBCUTANEOUS
  Administered 2016-02-01: 4 [IU] via SUBCUTANEOUS
  Administered 2016-02-02: 3 [IU] via SUBCUTANEOUS
  Administered 2016-02-03: 4 [IU] via SUBCUTANEOUS
  Administered 2016-02-03: 3 [IU] via SUBCUTANEOUS
  Administered 2016-02-04: 4 [IU] via SUBCUTANEOUS
  Administered 2016-02-04 – 2016-02-06 (×2): 3 [IU] via SUBCUTANEOUS
  Administered 2016-02-07 – 2016-02-09 (×3): 4 [IU] via SUBCUTANEOUS
  Administered 2016-02-09: 3 [IU] via SUBCUTANEOUS
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
## 2016-01-17 NOTE — H&P (Addendum)
Physical Medicine and Rehabilitation Admission H&P    Chief Complaint  Patient presents with  . penile injury    bleeding  : HPI: Christian [name] is a 75 y.o. right handed male with history of hypertension, diabetes mellitus. Per chart review patient lives with wife. Independent prior to admission. One level home with 5 steps to entry. Presented 01/10/2016 after he was working on a tractor when it slipped into gear and pinned him between a tractor and a pole. No loss of consciousness. CT of chest abdomen and pelvis showed sagittal right sacral alar fracture extending across the right sacroiliac joint and involving the right iliac bone. Bilateral parasymphyseal fractures extending into the pubic symphysis posteriorly. Right inferior pubic ramus fracture with minimal displacement. CT retrograde urethrogram due to some hematuria completed follow-up for urology services showed a large extraperitoneal clot anterior to the bladder. Bedside cystoscopy by urology unsuccessful due to seemingly significant urethral injury near the membrane is urethra and a super pubic tube was placed with plan for fluoroscopy guided exchange and upsizing in approximately 6 weeks. Underwent transsacral screw 2 for pelvic ring fracture. Touchdown weightbearing right leg to facilitate transfers. Weightbearing as tolerated left leg for transfers only essentially bed to chair 8 weeks. Also noted sacral ulcer 6 x 6 cm in size with Dr. Marla Roe  consulted10/08/2015 and advised air mattress as well as vitamin C and zinc. Bactroban to sacral area daily. Hospital course pain management. Subcutaneous Lovenox for DVT prophylaxis. Acute blood loss anemia 7.3 and monitored. Physical therapy evaluation completed and ongoing with recommendations of physical medicine rehabilitation consult.Patient was admitted for a comprehensive rehab program  ROS Constitutional: Negative for chills and fever.  HENT: Negative for hearing loss.   Eyes:  Negative for blurred vision and double vision.  Respiratory: Negative for cough and shortness of breath.   Cardiovascular: Negative for chest pain, palpitations and leg swelling.  Gastrointestinal: Negative for heartburn.  Genitourinary: Positive for urgency.  Musculoskeletal: Positive for myalgias.       Right ankle pain from history of Achilles tendon injury  Skin: Negative for rash.  Neurological: Negative for seizures, loss of consciousness and headaches.  Endo/Heme/Allergies: Does not bruise/bleed easily.  Psychiatric/Behavioral: Negative for depression.  All other systems reviewed and are negative   Past Medical History:  Diagnosis Date  . Diabetes mellitus without complication ([place])   . Hyperlipidemia   . Hypertension    Past Surgical History:  Procedure Laterality Date  . ACHILLES TENDON SURGERY Right   . TONSILLECTOMY    . ULNAR NERVE TRANSPOSITION Right    History reviewed. No pertinent family history. Social History:  reports that he has never smoked. He has never used smokeless tobacco. He reports that he does not drink alcohol. His drug history is not on file. Allergies: No Known Allergies Medications Prior to Admission  Medication Sig Dispense Refill  . amLODipine (NORVASC) 5 MG tablet Take 7.5 mg by mouth daily.    Marland Kitchen aspirin (GOODSENSE ASPIRIN) 325 MG tablet Take 325 mg by mouth daily.    Marland Kitchen atorvastatin (LIPITOR) 80 MG tablet Take 80 mg by mouth daily.    Marland Kitchen CINNAMON PO Take 1 capsule by mouth at bedtime.    . fluticasone (FLONASE) 50 MCG/ACT nasal spray Place 2 sprays into both nostrils daily.    Marland Kitchen gabapentin (NEURONTIN) 300 MG capsule Take 300 mg by mouth 3 (three) times daily.    Marland Kitchen glipiZIDE (GLUCOTROL) 5 MG tablet Take 5-10 mg by  mouth See admin instructions. 10 mg in the morning and 5 mg in the evening    . LEVEMIR FLEXTOUCH 100 UNIT/ML Pen Inject 5 Units into the skin at bedtime.     Marland Kitchen losartan (COZAAR) 50 MG tablet Take 50 mg by mouth daily.    . metFORMIN  (GLUCOPHAGE) 1000 MG tablet Take 1,000 mg by mouth 2 (two) times daily.    . traMADol (ULTRAM) 50 MG tablet Take 50 mg by mouth every 6 (six) hours as needed for pain.      Home: Home Living Family/patient expects to be discharged to:: Private residence Living Arrangements: Spouse/significant other Available Help at Discharge: Family, Available 24 hours/day Home Access: Stairs to enter [employer] of Steps: 3-4 Entrance Stairs-Rails: Right Home Layout: One level Bathroom Shower/Tub: Multimedia programmer: Handicapped height Home Equipment: Grab bars - toilet, Grab bars - tub/shower   Functional History: Prior Function Level of Independence: Independent  Functional Status:  Mobility: Bed Mobility Overal bed mobility: Needs Assistance, +2 for physical assistance Bed Mobility: Sit to Supine, Supine to Sit Supine to sit: Mod assist, +2 for physical assistance Sit to supine: Max assist, +2 for physical assistance General bed mobility comments: pt cued and assisted to scoot with L LE and UE's assist to lift trunk upright; then to supine assist for both legs and lowering trunk Transfers Overall transfer level: Needs assistance Equipment used: Rolling walker (2 wheeled) Transfers: Sit to/from Stand Sit to Stand: Max assist, +2 physical assistance, From elevated surface Stand pivot transfers: Mod assist, +2 physical assistance General transfer comment: Sit to stand only due to difficulty with TDWB on R. pushed up from bed with L UE      ADL: ADL Overall ADL's : Needs assistance/impaired Eating/Feeding: Modified independent, Bed level Grooming: Wash/dry hands, Wash/dry face, Oral care, Brushing hair, Set up, Bed level Upper Body Bathing: Set up, Bed level Lower Body Bathing: Maximal assistance, Bed level Upper Body Dressing : Maximal assistance, Bed level, Sitting Lower Body Dressing: Total assistance, Bed level Toilet Transfer: Total assistance Toilet  Transfer Details (indicate cue type and reason): unable to attempt  Toileting- Clothing Manipulation and Hygiene: Total assistance, Bed level Functional mobility during ADLs: Maximal assistance, Moderate assistance, +2 for physical assistance (bed mobility only )  Cognition: Cognition Overall Cognitive Status: Within Functional Limits for tasks assessed Orientation Level: Oriented X4 Cognition Arousal/Alertness: Awake/alert Behavior During Therapy: WFL for tasks assessed/performed Overall Cognitive Status: Within Functional Limits for tasks assessed  Physical Exam: Blood pressure 139/62, pulse 80, temperature 97.6 F (36.4 C), temperature source Oral, resp. rate 18, height 6\' 2"  (1.88 m), weight 100.7 kg (222 lb), SpO2 100 %. Physical Exam Constitutional: He is oriented to person, place, and time. He appears well-developed.  HENT:  Head: Normocephalic and atraumatic.  Right Ear: External ear normal.  Left Ear: External ear normal.  Eyes: Conjunctivae and EOM are normal. Pupils are equal, round, and reactive to light.  Neck: No tracheal deviation present. No thyromegaly present.  Cardiovascular: Normal rate, regular rhythm and normal heart sounds.   Respiratory: Effort normal and breath sounds normal. No respiratory distress. He has no wheezes. He has no rales.  GI: Soft. Bowel sounds are normal. He exhibits no distension. There is no tenderness. There is no rebound.  Genitourinary: No penile tenderness. SPC in place Musculoskeletal: He exhibits edema. He exhibits no deformity.  Right leg tender to basic palpation and PROM at hip and knee.   Neurological: He is alert  and oriented to person, place, and time.  UE 5/5 prox to distal. RLE: 1/5 HF, KE 3/5 ADF/PF. LLE: 3- HF, 3 KE and 4/5 ADF/PF. ?decreased LT bilateral distal lower extremities. Cognition appropriate.   Skin: Skin is warm.  Sacral wound dressed. Operative incision sites clean. Suprapubic catheter site clean and intact    Psychiatric: He has a normal mood and affect. His behavior is normal. Judgment and thought content normal   Results for orders placed or performed during the hospital encounter of 01/10/16 (from the past 48 hour(s))  Glucose, capillary     Status: Abnormal   Collection Time: 01/15/16  5:31 PM  Result Value Ref Range   Glucose-Capillary 123 (H) 65 - 99 mg/dL   Comment 1 Notify RN    Comment 2 Document in Chart   Glucose, capillary     Status: Abnormal   Collection Time: 01/15/16  8:54 PM  Result Value Ref Range   Glucose-Capillary 132 (H) 65 - 99 mg/dL  CBC     Status: Abnormal   Collection Time: 01/16/16  3:33 AM  Result Value Ref Range   WBC 9.6 4.0 - 10.5 K/uL   RBC 2.47 (L) 4.22 - 5.81 MIL/uL   Hemoglobin 7.3 (L) 13.0 - 17.0 g/dL   HCT 22.6 (L) 39.0 - 52.0 %   MCV 91.5 78.0 - 100.0 fL   MCH 29.6 26.0 - 34.0 pg   MCHC 32.3 30.0 - 36.0 g/dL   RDW 13.7 11.5 - 15.5 %   Platelets 167 150 - 400 K/uL  Type and screen [place]     Status: None (Preliminary result)   Collection Time: 01/16/16  3:40 AM  Result Value Ref Range   ABO/RH(D) O POS    Antibody Screen NEG    Sample Expiration 01/19/2016    Unit Number [id number]    Blood Component Type RED CELLS,LR    Unit division 00    Status of Unit ALLOCATED    Transfusion Status OK TO TRANSFUSE    Crossmatch Result Compatible    Unit Number [id number]    Blood Component Type RED CELLS,LR    Unit division 00    Status of Unit ALLOCATED    Transfusion Status OK TO TRANSFUSE    Crossmatch Result Compatible   Glucose, capillary     Status: Abnormal   Collection Time: 01/16/16  5:59 AM  Result Value Ref Range   Glucose-Capillary 160 (H) 65 - 99 mg/dL  Prepare RBC     Status: None   Collection Time: 01/16/16  7:48 AM  Result Value Ref Range   Order Confirmation ORDER PROCESSED BY BLOOD BANK   Glucose, capillary     Status: Abnormal   Collection Time: 01/16/16 12:18 PM  Result Value Ref Range    Glucose-Capillary 303 (H) 65 - 99 mg/dL  Glucose, capillary     Status: Abnormal   Collection Time: 01/16/16  4:42 PM  Result Value Ref Range   Glucose-Capillary 299 (H) 65 - 99 mg/dL  Glucose, capillary     Status: Abnormal   Collection Time: 01/16/16  9:29 PM  Result Value Ref Range   Glucose-Capillary 389 (H) 65 - 99 mg/dL  Glucose, capillary     Status: Abnormal   Collection Time: 01/17/16  6:32 AM  Result Value Ref Range   Glucose-Capillary 233 (H) 65 - 99 mg/dL  Glucose, capillary     Status: Abnormal   Collection Time: 01/17/16 12:02 PM  Result Value Ref Range   Glucose-Capillary 146 (H) 65 - 99 mg/dL   Dg Si Joints  Result Date: 01/16/2016 CLINICAL DATA:  Inter operative pinning of the SI joints. Fluoro time reported is 2 minutes 36 seconds. Eleven fluoro spot images EXAM: BILATERAL SACROILIAC JOINTS - 3+ VIEW; DG C-ARM 61-120 MIN COMPARISON:  Pelvis series of January 13, 2016 FINDINGS: The patient has undergone placement of fixation screws across the sacrum from the right medial iliac bone to the left medial iliac bone. There is no immediate postprocedure complication. There is a drainage catheter presumably in the pelvic cavity. IMPRESSION: The patient has undergone transsacral pinning of the SI joints without evidence of immediate postprocedure complication. Electronically Signed   By: David  Martinique M.D.   On: 01/16/2016 10:43   Dg Pelvis Comp Min 3v  Result Date: 01/16/2016 CLINICAL DATA:  Prior history of fracture and surgery. EXAM: JUDET PELVIS - 3+ VIEW COMPARISON:  01/16/2016. FINDINGS: Sacroiliac screws are again noted in stable position. Pelvic catheter, most likely suprapubic catheter noted. Degenerative changes lumbar spine and both hips . No acute bony abnormality. Pelvic calcifications consistent phleboliths. IMPRESSION: Postsurgical changes of the pelvis appears stable. Hardware intact. Degenerative changes lumbar spine and both hips. Catheter noted over the lower  pelvis, most likely a suprapubic catheter. Electronically Signed   By: Marcello Moores  Register   On: 01/16/2016 12:10   Dg C-arm 1-60 Min  Result Date: 01/16/2016 CLINICAL DATA:  Inter operative pinning of the SI joints. Fluoro time reported is 2 minutes 36 seconds. Eleven fluoro spot images EXAM: BILATERAL SACROILIAC JOINTS - 3+ VIEW; DG C-ARM 61-120 MIN COMPARISON:  Pelvis series of January 13, 2016 FINDINGS: The patient has undergone placement of fixation screws across the sacrum from the right medial iliac bone to the left medial iliac bone. There is no immediate postprocedure complication. There is a drainage catheter presumably in the pelvic cavity. IMPRESSION: The patient has undergone transsacral pinning of the SI joints without evidence of immediate postprocedure complication. Electronically Signed   By: David  Martinique M.D.   On: 01/16/2016 10:43       Medical Problem List and Plan: 1.  Multi pelvic fractures status post sacroiliac screw fixation secondary to trauma.   -Touchdown weightbearing right leg to facilitate transfers.   -Weightbearing as tolerated left leg for transfers only 8 weeks  -admit to inpatient rehab 2.  DVT Prophylaxis/Anticoagulation: SQ Lovenox.Check vascular study 3. Pain Management: Neurontin 300 mg TID,Ultram 50-100 mg Q6 prn 4. Acute blood loss anemia.Fergon 324 mg bid. Follow-up CBC 5. Neuropsych: This patient is capable of making decisions on his own behalf. 6. Skin/Wound Care/Sacral wound: Mattress replacement.Bactroban daily.WOC consulted. Follow-up plastic surgery Dr. Marla Roe 7. Fluids/Electrolytes/Nutrition: Routine I&O with follow-up chemistries 8. Urethral trauma. Status post suprapubic catheter. Follow-up with changes as directed. Urine clear/yellow at present 9. Diabetes mellitus with peripheral neuropathy. Hemoglobin A1c 7.9. Levemir 15 units daily at bedtime, Glucophage 1000 mg twice a day, Glucotrol 10 mg before breakfast and 5 mg before supper.  -  Check blood sugars before meals and at bedtime  -adjust regimen as needed 10. Hypertension. Norvasc 7.5 mg daily. Monitor with increased mobility 11. Hyperlipidemia. Lipitor 12. Constipation. Laxative assist    Post Admission Physician Evaluation: 1. Functional deficits secondary  to pelvic and urethral trauma. 2. Patient is admitted to receive collaborative, interdisciplinary care between the physiatrist, rehab nursing staff, and therapy team. 3. Patient's level of medical complexity and substantial therapy needs in context of that  medical necessity cannot be provided at a lesser intensity of care such as a SNF. 4. Patient has experienced substantial functional loss from his/her baseline which was documented above under the "Functional History" and "Functional Status" headings.  Judging by the patient's diagnosis, physical exam, and functional history, the patient has potential for functional progress which will result in measurable gains while on inpatient rehab.  These gains will be of substantial and practical use upon discharge  in facilitating mobility and self-care at the household level. 5. Physiatrist will provide 24 hour management of medical needs as well as oversight of the therapy plan/treatment and provide guidance as appropriate regarding the interaction of the two. 6. 24 hour rehab nursing will assist with bladder management, bowel management, safety, skin/wound care, disease management, medication administration, pain management and patient education  and help integrate therapy concepts, techniques,education, etc. 7. PT will assess and treat for/with: Lower extremity strength, range of motion, stamina, balance, functional mobility, safety, adaptive techniques and equipment, ortho precautions/weight bearing, pain control, education, w/c use.   Goals are: mod I to supervision at w/c level. 8. OT will assess and treat for/with: ADL's, functional mobility, safety, upper extremity  strength, adaptive techniques and equipment, pain mgt, ortho precautions, education.   Goals are: mod I to min assist. Therapy may not yet proceed with showering this patient. 9. SLP will assess and treat for/with: n/a.  Goals are: n/a. 10. Case Management and Social Worker will assess and treat for psychological issues and discharge planning. 11. Team conference will be held weekly to assess progress toward goals and to determine barriers to discharge. 12. Patient will receive at least 3 hours of therapy per day at least 5 days per week. 13. ELOS: 8-12 days       14. Prognosis:  excellent     [name], MD, [facility] 01/17/2016  01/17/2016

## 2016-01-17 NOTE — Progress Notes (Signed)
Orthopaedic Trauma Service Progress Note  Subjective  Doing much better Pain improved Has not worked with PT/OT post op yet Denies any new numbness or tingling in legs  ROS As above   Objective   BP 139/62 (BP Location: Left Arm)   Pulse 80   Temp 97.6 F (36.4 C) (Oral)   Resp 18   Ht 6\' 2"  (1.88 m)   Wt 100.7 kg (222 lb)   SpO2 100%   BMI 28.50 kg/m   Intake/Output      10/05 0701 - 10/06 0700 10/06 0701 - 10/07 0700   P.O. 480 240   I.V. (mL/kg) 2000 (19.9)    Other     IV Piggyback 250    Total Intake(mL/kg) 2730 (27.1) 240 (2.4)   Urine (mL/kg/hr) 3100 (1.3)    Stool     Blood 50 (0)    Total Output 3150     Net -420 +240          Labs  CBG (last 3)   Recent Labs  01/16/16 1642 01/16/16 2129 01/17/16 0632  GLUCAP 299* 389* 233*      Exam  Gen: awake and alert, NAD, appears very comfortable Abd: + BS but diminished  Pelvis: dressing R flank + drainage but stable  Ext:       Bilateral lower Extremities  DPN,SPN, TN sensation intact B  EHL intact Bilaterally, left stronger than right   FHL and lesser toe motor intact  Ankle flexion and extension intact  Ext warm   + DP pulse  No DCT   Assessment and Plan   POD/HD#: 1  75 y/o male s/p crush injury    -  Crush injury due to tractor    - R LC2 pelvic ring fracture s/p transsacral screw x 2             TDWB R leg to facilitate transfers  WBAT L leg for transfers only    Essentially bed to chair x 8 weeks  Ice prn  Dressing change tomorrow  PT/OT evals    - sacral decub  Per plastics   - urethral obstruction              Per urology    - ABL anemia/Hemodynamics             Cbc in am                - DVT/PE prophylaxis  lovenox bridge to coumadin  Start coumadin tomorrow if CBC stable and plastics does not have plans for OR in near future    - Dispo:             PT/OT evals   Jari Pigg, PA-C [facility] [phone]  (O) 01/17/2016 10:43 AM

## 2016-01-17 NOTE — Progress Notes (Signed)
Inpatient Rehabilitation  Per PT patient is an excellent CIR candidate and patient is medically cleared to admit to IP Rehab today.  Notified team.  Please call with questions.   Carmelia Roller., CCC/SLP Admission Coordinator  [place]  Cell [phone]

## 2016-01-17 NOTE — Significant Event (Signed)
CRITICAL VALUE ALERT  Critical value received:  HGB 6.3  Date of notification:  01/17/16  Time of notification:  1915  Critical value read back:Yes.    Nurse who received alert:  Dorien Chihuahua   MD notified (1st page):  Dr. Leanne Chang  Time of first page:  1928  MD notified (2nd page):  Time of second page:  Responding MD:  Dr. Leanne Chang  Time MD responded:  1929 order for HBG recheck in am.
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
## 2016-01-17 NOTE — H&P (View-Only) (Signed)
Physical Medicine and Rehabilitation Admission H&P    Chief Complaint  Patient presents with  . penile injury    bleeding  : HPI: Christian [name] is a 75 y.o. right handed male with history of hypertension, diabetes mellitus. Per chart review patient lives with wife. Independent prior to admission. One level home with 5 steps to entry. Presented 01/10/2016 after he was working on a tractor when it slipped into gear and pinned him between a tractor and a pole. No loss of consciousness. CT of chest abdomen and pelvis showed sagittal right sacral alar fracture extending across the right sacroiliac joint and involving the right iliac bone. Bilateral parasymphyseal fractures extending into the pubic symphysis posteriorly. Right inferior pubic ramus fracture with minimal displacement. CT retrograde urethrogram due to some hematuria completed follow-up for urology services showed a large extraperitoneal clot anterior to the bladder. Bedside cystoscopy by urology unsuccessful due to seemingly significant urethral injury near the membrane is urethra and a super pubic tube was placed with plan for fluoroscopy guided exchange and upsizing in approximately 6 weeks. Underwent transsacral screw 2 for pelvic ring fracture. Touchdown weightbearing right leg to facilitate transfers. Weightbearing as tolerated left leg for transfers only essentially bed to chair 8 weeks. Also noted sacral ulcer 6 x 6 cm in size with Dr. Marla Roe  consulted10/08/2015 and advised air mattress as well as vitamin C and zinc. Bactroban to sacral area daily. Hospital course pain management. Subcutaneous Lovenox for DVT prophylaxis. Acute blood loss anemia 7.3 and monitored. Physical therapy evaluation completed and ongoing with recommendations of physical medicine rehabilitation consult.Patient was admitted for a comprehensive rehab program  ROS Constitutional: Negative for chills and fever.  HENT: Negative for hearing loss.   Eyes:  Negative for blurred vision and double vision.  Respiratory: Negative for cough and shortness of breath.   Cardiovascular: Negative for chest pain, palpitations and leg swelling.  Gastrointestinal: Negative for heartburn.  Genitourinary: Positive for urgency.  Musculoskeletal: Positive for myalgias.       Right ankle pain from history of Achilles tendon injury  Skin: Negative for rash.  Neurological: Negative for seizures, loss of consciousness and headaches.  Endo/Heme/Allergies: Does not bruise/bleed easily.  Psychiatric/Behavioral: Negative for depression.  All other systems reviewed and are negative   Past Medical History:  Diagnosis Date  . Diabetes mellitus without complication ([place])   . Hyperlipidemia   . Hypertension    Past Surgical History:  Procedure Laterality Date  . ACHILLES TENDON SURGERY Right   . TONSILLECTOMY    . ULNAR NERVE TRANSPOSITION Right    History reviewed. No pertinent family history. Social History:  reports that he has never smoked. He has never used smokeless tobacco. He reports that he does not drink alcohol. His drug history is not on file. Allergies: No Known Allergies Medications Prior to Admission  Medication Sig Dispense Refill  . amLODipine (NORVASC) 5 MG tablet Take 7.5 mg by mouth daily.    Marland Kitchen aspirin (GOODSENSE ASPIRIN) 325 MG tablet Take 325 mg by mouth daily.    Marland Kitchen atorvastatin (LIPITOR) 80 MG tablet Take 80 mg by mouth daily.    Marland Kitchen CINNAMON PO Take 1 capsule by mouth at bedtime.    . fluticasone (FLONASE) 50 MCG/ACT nasal spray Place 2 sprays into both nostrils daily.    Marland Kitchen gabapentin (NEURONTIN) 300 MG capsule Take 300 mg by mouth 3 (three) times daily.    Marland Kitchen glipiZIDE (GLUCOTROL) 5 MG tablet Take 5-10 mg by  mouth See admin instructions. 10 mg in the morning and 5 mg in the evening    . LEVEMIR FLEXTOUCH 100 UNIT/ML Pen Inject 5 Units into the skin at bedtime.     Marland Kitchen losartan (COZAAR) 50 MG tablet Take 50 mg by mouth daily.    . metFORMIN  (GLUCOPHAGE) 1000 MG tablet Take 1,000 mg by mouth 2 (two) times daily.    . traMADol (ULTRAM) 50 MG tablet Take 50 mg by mouth every 6 (six) hours as needed for pain.      Home: Home Living Family/patient expects to be discharged to:: Private residence Living Arrangements: Spouse/significant other Available Help at Discharge: Family, Available 24 hours/day Home Access: Stairs to enter [employer] of Steps: 3-4 Entrance Stairs-Rails: Right Home Layout: One level Bathroom Shower/Tub: Multimedia programmer: Handicapped height Home Equipment: Grab bars - toilet, Grab bars - tub/shower   Functional History: Prior Function Level of Independence: Independent  Functional Status:  Mobility: Bed Mobility Overal bed mobility: Needs Assistance, +2 for physical assistance Bed Mobility: Sit to Supine, Supine to Sit Supine to sit: Mod assist, +2 for physical assistance Sit to supine: Max assist, +2 for physical assistance General bed mobility comments: pt cued and assisted to scoot with L LE and UE's assist to lift trunk upright; then to supine assist for both legs and lowering trunk Transfers Overall transfer level: Needs assistance Equipment used: Rolling walker (2 wheeled) Transfers: Sit to/from Stand Sit to Stand: Max assist, +2 physical assistance, From elevated surface Stand pivot transfers: Mod assist, +2 physical assistance General transfer comment: Sit to stand only due to difficulty with TDWB on R. pushed up from bed with L UE      ADL: ADL Overall ADL's : Needs assistance/impaired Eating/Feeding: Modified independent, Bed level Grooming: Wash/dry hands, Wash/dry face, Oral care, Brushing hair, Set up, Bed level Upper Body Bathing: Set up, Bed level Lower Body Bathing: Maximal assistance, Bed level Upper Body Dressing : Maximal assistance, Bed level, Sitting Lower Body Dressing: Total assistance, Bed level Toilet Transfer: Total assistance Toilet  Transfer Details (indicate cue type and reason): unable to attempt  Toileting- Clothing Manipulation and Hygiene: Total assistance, Bed level Functional mobility during ADLs: Maximal assistance, Moderate assistance, +2 for physical assistance (bed mobility only )  Cognition: Cognition Overall Cognitive Status: Within Functional Limits for tasks assessed Orientation Level: Oriented X4 Cognition Arousal/Alertness: Awake/alert Behavior During Therapy: WFL for tasks assessed/performed Overall Cognitive Status: Within Functional Limits for tasks assessed  Physical Exam: Blood pressure 139/62, pulse 80, temperature 97.6 F (36.4 C), temperature source Oral, resp. rate 18, height 6\' 2"  (1.88 m), weight 100.7 kg (222 lb), SpO2 100 %. Physical Exam Constitutional: He is oriented to person, place, and time. He appears well-developed.  HENT:  Head: Normocephalic and atraumatic.  Right Ear: External ear normal.  Left Ear: External ear normal.  Eyes: Conjunctivae and EOM are normal. Pupils are equal, round, and reactive to light.  Neck: No tracheal deviation present. No thyromegaly present.  Cardiovascular: Normal rate, regular rhythm and normal heart sounds.   Respiratory: Effort normal and breath sounds normal. No respiratory distress. He has no wheezes. He has no rales.  GI: Soft. Bowel sounds are normal. He exhibits no distension. There is no tenderness. There is no rebound.  Genitourinary: No penile tenderness. SPC in place Musculoskeletal: He exhibits edema. He exhibits no deformity.  Right leg tender to basic palpation and PROM at hip and knee.   Neurological: He is alert  and oriented to person, place, and time.  UE 5/5 prox to distal. RLE: 1/5 HF, KE 3/5 ADF/PF. LLE: 3- HF, 3 KE and 4/5 ADF/PF. ?decreased LT bilateral distal lower extremities. Cognition appropriate.   Skin: Skin is warm.  Sacral wound dressed. Operative incision sites clean. Suprapubic catheter site clean and intact    Psychiatric: He has a normal mood and affect. His behavior is normal. Judgment and thought content normal   Results for orders placed or performed during the hospital encounter of 01/10/16 (from the past 48 hour(s))  Glucose, capillary     Status: Abnormal   Collection Time: 01/15/16  5:31 PM  Result Value Ref Range   Glucose-Capillary 123 (H) 65 - 99 mg/dL   Comment 1 Notify RN    Comment 2 Document in Chart   Glucose, capillary     Status: Abnormal   Collection Time: 01/15/16  8:54 PM  Result Value Ref Range   Glucose-Capillary 132 (H) 65 - 99 mg/dL  CBC     Status: Abnormal   Collection Time: 01/16/16  3:33 AM  Result Value Ref Range   WBC 9.6 4.0 - 10.5 K/uL   RBC 2.47 (L) 4.22 - 5.81 MIL/uL   Hemoglobin 7.3 (L) 13.0 - 17.0 g/dL   HCT 22.6 (L) 39.0 - 52.0 %   MCV 91.5 78.0 - 100.0 fL   MCH 29.6 26.0 - 34.0 pg   MCHC 32.3 30.0 - 36.0 g/dL   RDW 13.7 11.5 - 15.5 %   Platelets 167 150 - 400 K/uL  Type and screen [place]     Status: None (Preliminary result)   Collection Time: 01/16/16  3:40 AM  Result Value Ref Range   ABO/RH(D) O POS    Antibody Screen NEG    Sample Expiration 01/19/2016    Unit Number [id number]    Blood Component Type RED CELLS,LR    Unit division 00    Status of Unit ALLOCATED    Transfusion Status OK TO TRANSFUSE    Crossmatch Result Compatible    Unit Number [id number]    Blood Component Type RED CELLS,LR    Unit division 00    Status of Unit ALLOCATED    Transfusion Status OK TO TRANSFUSE    Crossmatch Result Compatible   Glucose, capillary     Status: Abnormal   Collection Time: 01/16/16  5:59 AM  Result Value Ref Range   Glucose-Capillary 160 (H) 65 - 99 mg/dL  Prepare RBC     Status: None   Collection Time: 01/16/16  7:48 AM  Result Value Ref Range   Order Confirmation ORDER PROCESSED BY BLOOD BANK   Glucose, capillary     Status: Abnormal   Collection Time: 01/16/16 12:18 PM  Result Value Ref Range    Glucose-Capillary 303 (H) 65 - 99 mg/dL  Glucose, capillary     Status: Abnormal   Collection Time: 01/16/16  4:42 PM  Result Value Ref Range   Glucose-Capillary 299 (H) 65 - 99 mg/dL  Glucose, capillary     Status: Abnormal   Collection Time: 01/16/16  9:29 PM  Result Value Ref Range   Glucose-Capillary 389 (H) 65 - 99 mg/dL  Glucose, capillary     Status: Abnormal   Collection Time: 01/17/16  6:32 AM  Result Value Ref Range   Glucose-Capillary 233 (H) 65 - 99 mg/dL  Glucose, capillary     Status: Abnormal   Collection Time: 01/17/16 12:02 PM  Result Value Ref Range   Glucose-Capillary 146 (H) 65 - 99 mg/dL   Dg Si Joints  Result Date: 01/16/2016 CLINICAL DATA:  Inter operative pinning of the SI joints. Fluoro time reported is 2 minutes 36 seconds. Eleven fluoro spot images EXAM: BILATERAL SACROILIAC JOINTS - 3+ VIEW; DG C-ARM 61-120 MIN COMPARISON:  Pelvis series of January 13, 2016 FINDINGS: The patient has undergone placement of fixation screws across the sacrum from the right medial iliac bone to the left medial iliac bone. There is no immediate postprocedure complication. There is a drainage catheter presumably in the pelvic cavity. IMPRESSION: The patient has undergone transsacral pinning of the SI joints without evidence of immediate postprocedure complication. Electronically Signed   By: David  Martinique M.D.   On: 01/16/2016 10:43   Dg Pelvis Comp Min 3v  Result Date: 01/16/2016 CLINICAL DATA:  Prior history of fracture and surgery. EXAM: JUDET PELVIS - 3+ VIEW COMPARISON:  01/16/2016. FINDINGS: Sacroiliac screws are again noted in stable position. Pelvic catheter, most likely suprapubic catheter noted. Degenerative changes lumbar spine and both hips . No acute bony abnormality. Pelvic calcifications consistent phleboliths. IMPRESSION: Postsurgical changes of the pelvis appears stable. Hardware intact. Degenerative changes lumbar spine and both hips. Catheter noted over the lower  pelvis, most likely a suprapubic catheter. Electronically Signed   By: Marcello Moores  Register   On: 01/16/2016 12:10   Dg C-arm 1-60 Min  Result Date: 01/16/2016 CLINICAL DATA:  Inter operative pinning of the SI joints. Fluoro time reported is 2 minutes 36 seconds. Eleven fluoro spot images EXAM: BILATERAL SACROILIAC JOINTS - 3+ VIEW; DG C-ARM 61-120 MIN COMPARISON:  Pelvis series of January 13, 2016 FINDINGS: The patient has undergone placement of fixation screws across the sacrum from the right medial iliac bone to the left medial iliac bone. There is no immediate postprocedure complication. There is a drainage catheter presumably in the pelvic cavity. IMPRESSION: The patient has undergone transsacral pinning of the SI joints without evidence of immediate postprocedure complication. Electronically Signed   By: David  Martinique M.D.   On: 01/16/2016 10:43       Medical Problem List and Plan: 1.  Multi pelvic fractures status post sacroiliac screw fixation secondary to trauma.   -Touchdown weightbearing right leg to facilitate transfers.   -Weightbearing as tolerated left leg for transfers only 8 weeks  -admit to inpatient rehab 2.  DVT Prophylaxis/Anticoagulation: SQ Lovenox.Check vascular study 3. Pain Management: Neurontin 300 mg TID,Ultram 50-100 mg Q6 prn 4. Acute blood loss anemia.Fergon 324 mg bid. Follow-up CBC 5. Neuropsych: This patient is capable of making decisions on his own behalf. 6. Skin/Wound Care/Sacral wound: Mattress replacement.Bactroban daily.WOC consulted. Follow-up plastic surgery Dr. Marla Roe 7. Fluids/Electrolytes/Nutrition: Routine I&O with follow-up chemistries 8. Urethral trauma. Status post suprapubic catheter. Follow-up with changes as directed. Urine clear/yellow at present 9. Diabetes mellitus with peripheral neuropathy. Hemoglobin A1c 7.9. Levemir 15 units daily at bedtime, Glucophage 1000 mg twice a day, Glucotrol 10 mg before breakfast and 5 mg before supper.  -  Check blood sugars before meals and at bedtime  -adjust regimen as needed 10. Hypertension. Norvasc 7.5 mg daily. Monitor with increased mobility 11. Hyperlipidemia. Lipitor 12. Constipation. Laxative assist    Post Admission Physician Evaluation: 1. Functional deficits secondary  to pelvic and urethral trauma. 2. Patient is admitted to receive collaborative, interdisciplinary care between the physiatrist, rehab nursing staff, and therapy team. 3. Patient's level of medical complexity and substantial therapy needs in context of that  medical necessity cannot be provided at a lesser intensity of care such as a SNF. 4. Patient has experienced substantial functional loss from his/her baseline which was documented above under the "Functional History" and "Functional Status" headings.  Judging by the patient's diagnosis, physical exam, and functional history, the patient has potential for functional progress which will result in measurable gains while on inpatient rehab.  These gains will be of substantial and practical use upon discharge  in facilitating mobility and self-care at the household level. 5. Physiatrist will provide 24 hour management of medical needs as well as oversight of the therapy plan/treatment and provide guidance as appropriate regarding the interaction of the two. 6. 24 hour rehab nursing will assist with bladder management, bowel management, safety, skin/wound care, disease management, medication administration, pain management and patient education  and help integrate therapy concepts, techniques,education, etc. 7. PT will assess and treat for/with: Lower extremity strength, range of motion, stamina, balance, functional mobility, safety, adaptive techniques and equipment, ortho precautions/weight bearing, pain control, education, w/c use.   Goals are: mod I to supervision at w/c level. 8. OT will assess and treat for/with: ADL's, functional mobility, safety, upper extremity  strength, adaptive techniques and equipment, pain mgt, ortho precautions, education.   Goals are: mod I to min assist. Therapy may not yet proceed with showering this patient. 9. SLP will assess and treat for/with: n/a.  Goals are: n/a. 10. Case Management and Social Worker will assess and treat for psychological issues and discharge planning. 11. Team conference will be held weekly to assess progress toward goals and to determine barriers to discharge. 12. Patient will receive at least 3 hours of therapy per day at least 5 days per week. 13. ELOS: 8-12 days       14. Prognosis:  excellent     [name], MD, [place] [facility] 01/17/2016  01/17/2016

## 2016-01-17 NOTE — Progress Notes (Signed)
Urology Progress Note  : Urology continuing to follow pt: Has s-p tube in place, following urethral trauma, with cysto showing complete distraction of urethra. He needsa to have s-p tube to straight drain, and changed q month in IR.    In 3 months he will have flex cysto, to see if he has resolved his urethral distraction, and, if not, then he will continue his s-p tube in preparation for urethroplasty at month 6.    He has scrotal edema and bruising. He needs crushed towel underneath his scrotum. To help with gravity drainage.   Subjective:     No acute urologic events overnight. Ambulation:   negative. Just learning transfer.  Flatus:    positive Bowel movement  positive  Pain: some relief  Objective:  Blood pressure 140/63, pulse 92, temperature 98.6 F (37 C), temperature source Oral, resp. rate 16, height 6\' 2"  (1.88 m), weight 101.6 kg (224 lb), SpO2 92 %.  Physical Exam:  General:  No acute distress, awake Extremities: extremities normal, atraumatic, no cyanosis or edema Genitourinary:  Scrotum edema/ lymph fluid.  Foley: s-p tube to straight drain.     No intake/output data recorded.  Recent Labs     01/15/16  0551  01/16/16  0333  HGB  7.1*  7.3*  WBC  7.8  9.6  PLT  129*  167    No results for input(s): NA, K, CL, CO2, BUN, CREATININE, CALCIUM, GFRNONAA, GFRAA in the last 72 hours.  Invalid input(s): MAGNESIUM   No results for input(s): INR, APTT in the last 72 hours.  Invalid input(s): PT   Invalid input(s): ABG  Assessment/Plan:  Catheter not removed. Keep to straight drain ( as above. Crush towel underneath scrotum.

## 2016-01-17 NOTE — Progress Notes (Signed)
Gunn[facility] Fusi [facility] Admission Coordinator Signed Physical Medicine and Rehabilitation  PMR Pre-admission Date of Service: 01/17/2016 2:16 PM  Related encounter: ED to Hosp-Admission (Current) from 01/10/2016 in [place]       [] Hide copied text PMR Admission Coordinator Pre-Admission Assessment  Patient: Christian [name] is an 75 y.o., male MRN: [id number] DOB: [birth year] Height: 6\' 2"  (188 cm) Weight: 100.7 kg (222 lb)                                                                                                                                                  Insurance Information HMO:     PPO:      PCP:      IPA:      80/20:      OTHER:  PRIMARY: Medicare A & B      Policy#: [id number]      Subscriber: Self CM Name:       Phone#:      Fax#:  Pre-Cert#:       Employer: Retired Benefits:  Phone #:      Name:  Eff. Date:  Part A 06/12/06; Part B 05/15/07     Deduct: $1,316      Out of Pocket Max: none      Life Max: none CIR: 100%      SNF: 100% days 1-20; 80% days 21-100 Outpatient: PT/OT/SLP 80%     Co-Pay: 20%  Home Health: PT/OT/SLP 100%      Co-Pay: none DME: 80%     Co-Pay: 20% Providers: patient's choice   SECONDARY: BCBS Supplement       Policy#: [id number]      Subscriber: Self CM Name:       Phone#:      Fax#:  Pre-Cert#:       Employer:  Benefits:  Phone #: [phone]     Name:  Eff. Date:      Deduct:       Out of Pocket Max:       Life Max:  CIR:       SNF:  Outpatient:      Co-Pay:  Home Health:       Co-Pay:  DME:      Co-Pay:   Medicaid Application Date:       Case Manager:  Disability Application Date:       Case Worker:   Emergency Contact Information        Contact Information    Name Relation Home Work Mobile   [name],Barbara Spouse [phone]     [facility] Daughter [phone]       Current Medical History  Patient Admitting Diagnosis: Multiple pelvic fractures/urethral trauma due to accident  on tractor  History of Present Illness: Christian [name] a 75 y.o.right handed malewith history of  hypertension, diabetes mellitus.Per chart review patient lives with wife and was independent prior to admission. Presented 01/10/2016 after he was working on a tractor when it slipped into gear and pinned him between a tractor and a pole. No loss of consciousness. CT of chest abdomen and pelvis showed sagittal right sacral alarfracture extending across the right sacroiliac joint and involving the right iliac bone. Bilateral parasymphyseal fractures extending into the pubic symphysis posteriorly. Right inferior pubic ramus fracture with minimal displacement. CT retrograde urethrogram due to some hematuria completed follow-up for urology services showed a large extraperitoneal clot anterior to the bladder. Bedside cystoscopy by urology unsuccessful due to seemingly significant urethral injury near the membrane is urethra and a super pubic tube was placed with plan for fluoroscopy guided exchange and upsizing in approximately 6 weeks. Underwent transsacral screw 2 for pelvic ring fracture. Touchdown weightbearing right leg to facilitate transfers. Weightbearing as tolerated left leg for transfers only essentially bed to chair 8 weeks. Also noted sacral ulcer 6 x 6 cm in size with Dr. Marla Roe consulted 01/16/2016 and advised air mattress as well as vitamin C and zinc. Bactroban to sacral area daily. Hospital course pain management. Subcutaneous Lovenox for DVT prophylaxis. Acute blood loss anemia 7.3 and monitored. Physical therapy evaluation completed and ongoing with recommendations of physical medicine rehabilitation consult. Patient was admitted for a comprehensive rehab program 01/17/16.      Past Medical History      Past Medical History:  Diagnosis Date  . Diabetes mellitus without complication ([place])   . Hyperlipidemia   . Hypertension     Family History  family history is not on  file.  Prior Rehab/Hospitalizations:  Has the patient had major surgery during 100 days prior to admission? No  Current Medications   Current Facility-Administered Medications:  .  acetaminophen (TYLENOL) tablet 650 mg, 650 mg, Oral, Q4H PRN, Donnie Mesa, MD, 650 mg at 01/12/16 1927 .  amLODipine (NORVASC) tablet 7.5 mg, 7.5 mg, Oral, Daily, Donnie Mesa, MD, 7.5 mg at 01/17/16 0851 .  atorvastatin (LIPITOR) tablet 80 mg, 80 mg, Oral, Daily, Donnie Mesa, MD, 80 mg at 01/17/16 0850 .  calcium carbonate (OS-CAL - dosed in mg of elemental calcium) tablet 500 mg of elemental calcium, 1 tablet, Oral, TID WC, Georganna Skeans, MD, 500 mg of elemental calcium at 01/17/16 1104 .  docusate sodium (COLACE) capsule 100 mg, 100 mg, Oral, BID, [name], PA-C, 100 mg at 01/17/16 0851 .  enoxaparin (LOVENOX) injection 30 mg, 30 mg, Subcutaneous, Q12H, [name], PA-C, 30 mg at 01/17/16 0853 .  feeding supplement (GLUCERNA SHAKE) (GLUCERNA SHAKE) liquid 237 mL, 237 mL, Oral, TID BM, Claire S Dillingham, DO, 237 mL at 01/17/16 1357 .  feeding supplement (PRO-STAT SUGAR FREE 64) liquid 30 mL, 30 mL, Oral, BID, Claire S Dillingham, DO, 30 mL at 01/17/16 0853 .  ferrous gluconate (FERGON) tablet 324 mg, 324 mg, Oral, BID WC, Judeth Horn, MD, 324 mg at 01/17/16 0848 .  fluticasone (FLONASE) 50 MCG/ACT nasal spray 2 spray, 2 spray, Each Nare, Daily, Donnie Mesa, MD, 2 spray at 01/17/16 0854 .  gabapentin (NEURONTIN) capsule 300 mg, 300 mg, Oral, TID, Donnie Mesa, MD, 300 mg at 01/17/16 0852 .  glipiZIDE (GLUCOTROL) tablet 10 mg, 10 mg, Oral, QAC breakfast, 10 mg at 01/17/16 0850 **AND** glipiZIDE (GLUCOTROL) tablet 5 mg, 5 mg, Oral, QAC supper, Karmen Bongo, MD, 5 mg at 01/16/16 1650 .  HYDROmorphone (DILAUDID) injection 0.5-1 mg, 0.5-1 mg,  Intravenous, Q4H PRN, Judeth Horn, MD, 1 mg at 01/17/16 1401 .  insulin aspart (novoLOG) injection 0-20 Units, 0-20 Units, Subcutaneous, TID WC,  [name], PA-C, 3 Units at 01/17/16 1359 .  insulin aspart (novoLOG) injection 0-5 Units, 0-5 Units, Subcutaneous, QHS, Donnie Mesa, MD, 5 Units at 01/16/16 2314 .  insulin aspart (novoLOG) injection 4 Units, 4 Units, Subcutaneous, TID WC, Donnie Mesa, MD, 4 Units at 01/17/16 1357 .  insulin detemir (LEVEMIR) injection 15 Units, 15 Units, Subcutaneous, QHS, [name], PA-C .  metFORMIN (GLUCOPHAGE) tablet 1,000 mg, 1,000 mg, Oral, BID WC, Donnie Mesa, MD, 1,000 mg at 01/17/16 0849 .  multivitamins with iron tablet 1 tablet, 1 tablet, Oral, Daily, Judeth Horn, MD, 1 tablet at 01/17/16 1104 .  mupirocin cream (BACTROBAN) 2 %, , Topical, Daily, Ainsley Spinner, PA-C .  ondansetron [facility]) tablet 4 mg, 4 mg, Oral, Q6H PRN **OR** ondansetron (ZOFRAN) injection 4 mg, 4 mg, Intravenous, Q6H PRN, Donnie Mesa, MD, 4 mg at 01/11/16 [id number] .  pantoprazole (PROTONIX) EC tablet 40 mg, 40 mg, Oral, BID, 40 mg at 01/17/16 0852 **OR** [DISCONTINUED] famotidine (PEPCID) IVPB 20 mg premix, 20 mg, Intravenous, BID, Donnie Mesa, MD .  polyethylene glycol (MIRALAX / GLYCOLAX) packet 17 g, 17 g, Oral, Daily, [name], PA-C, 17 g at 01/17/16 0853 .  traMADol (ULTRAM) tablet 50-100 mg, 50-100 mg, Oral, Q6H PRN, [name], PA-C, 50 mg at 01/17/16 1113 .  vitamin C (ASCORBIC ACID) tablet 500 mg, 500 mg, Oral, BID, Loel Lofty Dillingham, DO, 500 mg at 01/17/16 0848 .  zinc sulfate capsule 220 mg, 220 mg, Oral, Daily, Loel Lofty Dillingham, DO, 220 mg at 01/17/16 [id number]  Patients Current Diet: Diet Carb Modified Fluid consistency: Thin; Room service appropriate? Yes  Precautions / Restrictions Precautions Precautions: Fall Restrictions Weight Bearing Restrictions: Yes RLE Weight Bearing: Non weight bearing LLE Weight Bearing: Weight bearing as tolerated Other Position/Activity Restrictions: LLE WBAT for transfers only   Has the patient had 2 or more falls or a fall with injury in the  past year?No  Prior Activity Level Community (5-7x/wk): Prior to admission patient was independent and active.  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  He and his wife of 2 years traveled to visit children, danced on Saturdays, and enjoyed spending time together.  Home Assistive Devices / Equipment Home Assistive Devices/Equipment: Hearing aid (hearing aids missing prior to coming to hospital) Home Equipment: Environmental consultant - 2 wheels, Crutches, [place] - single point, Shower seat, Grab bars - toilet, Grab bars - tub/shower, Hand held shower head  Prior Device Use: Indicate devices/aids used by the patient prior to current illness, exacerbation or injury? None of the above  Prior Functional Level Prior Function Level of Independence: Independent  Self Care: Did the patient need help bathing, dressing, using the toilet or eating?  Independent  Indoor Mobility: Did the patient need assistance with walking from room to room (with or without device)? Independent  Stairs: Did the patient need assistance with internal or external stairs (with or without device)? Independent  Functional Cognition: Did the patient need help planning regular tasks such as shopping or remembering to take medications? Independent  Current Functional Level Cognition Overall Cognitive Status: Within Functional Limits for tasks assessed Orientation Level: Oriented X4    Extremity Assessment (includes Sensation/Coordination) Upper Extremity Assessment: Overall WFL for tasks assessed  Lower Extremity Assessment: Defer to PT evaluation RLE Deficits /  Details: AAROM grossly WFL with hip in abducted position, no attempt to adduct the leg due to pain and edema in groin; strength knee extension 3/5 LLE Deficits / Details: AAROM grossly WFL with hip in abducted position, no attempt to adduct the leg due to pain and edema in groin; strength hip flexion 3/5 with pain, knee extension 4-/5   ADLs  Overall ADL's : Needs assistance/impaired Eating/Feeding: Modified independent, Bed level Grooming: Wash/dry face, Total assistance, Sitting, Set up, Bed level (Pt requires BUE support sitting EOB) Grooming Details (indicate cue type and reason): Pt able to perform grooming at bed level with set up, when sitting requires BUE support and so is total assist for grooming Upper Body Bathing: Set up, Bed level Lower Body Bathing: Maximal assistance, Bed level Upper Body Dressing : Maximal assistance, Bed level, Sitting Lower Body Dressing: Total assistance, Bed level Toilet Transfer: Total assistance, +2 for physical assistance, +2 for safety/equipment Toilet Transfer Details (indicate cue type and reason): unable to attempt  Toileting- Clothing Manipulation and Hygiene: Total assistance, Bed level Functional mobility during ADLs: +2 for physical assistance, +2 for safety/equipment, Maximal assistance General ADL Comments: Pt willing and able to work with therapy. Pt limited by pain, but active participant and motivated to improve independence with ADL.   Mobility Overal bed mobility: +2 for physical assistance, Needs Assistance Bed Mobility: Sit to Supine, Supine to Sit Supine to sit: +2 for physical assistance, Max assist Sit to supine: +2 for physical assistance, Max assist General bed mobility comments: assist needed at trunk and LEs to come to sitting EOB, using bed pad to assist with pivot. Pt assisting as able with UEs.    Transfers Overall transfer level: Needs assistance Equipment used: Rolling walker (2 wheeled) Transfers: Sit to/from Stand Sit to Stand: Max assist, +2 physical assistance, From elevated surface Stand pivot transfers: Mod assist, +2 physical assistance General transfer comment: Pt declined to attempt at this time.    Ambulation / Gait / Stairs / Scientist, clinical (histocompatibility and immunogenetics) / Balance Dynamic Sitting Balance Sitting balance - Comments: leans back on UE's for  support Balance Overall balance assessment: Needs assistance Sitting-balance support: Bilateral upper extremity supported, Feet supported Sitting balance-Leahy Scale: Poor Sitting balance - Comments: leans back on UE's for support Standing balance support: Bilateral upper extremity supported Standing balance-Leahy Scale: Poor Standing balance comment: assist and UE support for balance; able to sit without support today for about 3-4 minutes   Special needs/care consideration BiPAP/CPAP No CPM: No Continuous Drip IV: No Dialysis: No        Life Vest: No Oxygen: No PTA; however, now on nasal cannula Special Bed: *per Plastic Surgery patient needs an air mattress for sacral wound Trach Size: No Wound Vac (area): No       Skin: Bruising right side, back, pelvis, scrotum; Wound sacrum; Surgical site suprapubic pelvis                               Bowel mgmt: 01/16/16 Bladder mgmt: Suprapubic catheter  Diabetic mgmt: Yes, managed with diet, oral medication, and Levemir pen prior to admission     Previous Home Environment Living Arrangements: Spouse/significant other Available Help at Discharge: Family, Available 24 hours/day Type of Home: House Home Layout: One level Home Access: Stairs to enter Entrance Stairs-Rails: Right, Left, Can reach both Entrance Stairs-Number of Steps: 3-4 Bathroom Shower/Tub: Multimedia programmer: Handicapped height  Bathroom Accessibility: Yes How Accessible: Accessible via wheelchair, Accessible via walker [place]: No  Discharge Living Setting Plans for Discharge Living Setting: Other (Comment) (wife's home (married for 2 years he still has his place too)) Type of Home at Discharge: House Discharge Home Layout: One level Discharge Home Access: Stairs to enter Entrance Stairs-Rails: Can reach both Entrance Stairs-Number of Steps: 3 Discharge Bathroom Shower/Tub: Walk-in shower Discharge Bathroom Toilet: Handicapped  height Discharge Bathroom Accessibility: Yes How Accessible: Accessible via walker Does the patient have any problems obtaining your medications?: No  Social/Family/Support Systems Patient Roles: Spouse, Parent Contact Information: Gervase, [place] [phone] Anticipated Caregiver: Spouse Anticipated Caregiver's Contact Information: see above Ability/Limitations of Caregiver: physical Min assist  Caregiver Availability: 24/7 Discharge Plan Discussed with Primary Caregiver: Yes Is Caregiver In Agreement with Plan?: Yes Does Caregiver/Family have Issues with Lodging/Transportation while Pt is in Rehab?: No  Goals/Additional Needs Patient/Family Goal for Rehab: OT/PT Mod I-Supervision  Expected length of stay: 12-15 days Cultural Considerations: [place] Dietary Needs: Carb Mod Medium  Equipment Needs: TBD Special Service Needs: None Additional Information: Being followed by Plastics for sacral wound and Urology for suprapubic cath Pt/Family Agrees to Admission and willing to participate: Yes Program Orientation Provided & Reviewed with Pt/Caregiver Including Roles  & Responsibilities: Yes Additional Information Needs: Follow up from Urology and Plastics  Information Needs to be Provided By: Team  Decrease burden of Care through IP rehab admission: No  Possible need for SNF placement upon discharge: No  Patient Condition: This patient's condition remains as documented in the consult dated 01/17/16, in which the Rehabilitation Physician determined and documented that the patient's condition is appropriate for intensive rehabilitative care in an inpatient rehabilitation facility. Will admit to inpatient rehab today.  Preadmission Screen Completed By:  Gunnar Fusi, 01/17/2016 3:35 PM ______________________________________________________________________   Discussed status with Dr. Naaman Plummer on 01/17/16 at 1535 and received telephone approval for admission today.  Admission  Coordinator:  Gunnar Fusi, time 1535/Date 01/17/16       Cosigned by: [name], MD at 01/17/2016 3:40 PM  Revision History

## 2016-01-17 NOTE — Care Management Note (Signed)
Case Management Note  Patient Details  Name: Christian [name] MRN: [id number] Date of Birth: [birth year]  Subjective/Objective:  Pt medically stable for discharge today.                    Action/Plan: Pt discharging to [facility] later today.    Expected Discharge Date: 01/17/2016   Expected Discharge Plan:  [place]  In-House Referral:     Discharge planning Services  CM Consult  Post Acute Care Choice:    Choice offered to:     DME Arranged:    DME Agency:     HH Arranged:    HH Agency:     Status of Service:  Completed, signed off  If discussed at [employer], dates discussed:    Additional Comments:  Reinaldo Raddle, RN, BSN  Trauma/Neuro ICU Case Manager [phone]

## 2016-01-17 NOTE — Evaluation (Signed)
Occupational Therapy Evaluation Patient Details Name: Christian [name] MRN: [id number] DOB: 1940/04/25 Today's Date: 01/17/2016    History of Present Illness This 75 y.o. male admitted after tractor rolled into him and pinned him against a pole.  Pelvic CT showed:  Sagittal right sacral ala fracture extending across the right SI join to right iliac bone, Bilateral parasymphyseal fxs extending into pubic symphysis, Right inferior pubic ramus fracture, Urethral injury - s/p SP tube placement by IR. Pt s/p SACROILIAC SCREW FIXATION ACROSS THE RIGHT AND LEFT AT S1 AND S2 on 01/16/16. PMH: HTN, diabetes, Rt achilles surgery.    Clinical Impression   PTA Pt independent in ADL and mobility. Pt currently total assist for ADL (set up at bed level) and +2 assist for mobility (sat EOB for approx 8 min during session declined attempt to stand this session). Pt very willing to work with therapy and pleasant. Please see deficit list below. Pt will benefit from skilled OT in the acute care setting before d/c to CIR to maximize independence in self care tasks.     Follow Up Recommendations  CIR;Supervision/Assistance - 24 hour    Equipment Recommendations  Other (comment) (To be determined by next venue of care)    Recommendations for Other Services       Precautions / Restrictions Precautions Precautions: Fall Restrictions Weight Bearing Restrictions: Yes RLE Weight Bearing: Non weight bearing LLE Weight Bearing: Weight bearing as tolerated Other Position/Activity Restrictions: LLE WBAT for transfers only      Mobility Bed Mobility Overal bed mobility: +2 for physical assistance;Needs Assistance Bed Mobility: Sit to Supine;Supine to Sit     Supine to sit: +2 for physical assistance;Max assist Sit to supine: +2 for physical assistance;Max assist   General bed mobility comments: assist needed at trunk and LEs to come to sitting EOB, using bed pad to assist with pivot. Pt assisting as able  with UEs.   Transfers Overall transfer level: Needs assistance               General transfer comment: Pt declined to attempt at this time.     Balance Overall balance assessment: Needs assistance Sitting-balance support: Bilateral upper extremity supported;Feet supported Sitting balance-Leahy Scale: Poor Sitting balance - Comments: leans back on UE's for support                                    ADL Overall ADL's : Needs assistance/impaired Eating/Feeding: Modified independent;Bed level   Grooming: Wash/dry face;Total assistance;Sitting;Set up;Bed level (Pt requires BUE support sitting EOB) Grooming Details (indicate cue type and reason): Pt able to perform grooming at bed level with set up, when sitting requires BUE support and so is total assist for grooming Upper Body Bathing: Set up;Bed level   Lower Body Bathing: Maximal assistance;Bed level   Upper Body Dressing : Maximal assistance;Bed level;Sitting   Lower Body Dressing: Total assistance;Bed level   Toilet Transfer: Total assistance;+2 for physical assistance;+2 for safety/equipment Toilet Transfer Details (indicate cue type and reason): unable to attempt          Functional mobility during ADLs: +2 for physical assistance;+2 for safety/equipment;Maximal assistance General ADL Comments: Pt willing and able to work with therapy. Pt limited by pain, but active participant and motivated to improve independence with ADL.     Vision     Perception     Praxis  Pertinent Vitals/Pain Pain Assessment: 0-10 Pain Score: 7  Pain Location: pelvis/back Pain Descriptors / Indicators: Throbbing;Sore Pain Intervention(s): Limited activity within patient's tolerance;Monitored during session;Premedicated before session     Hand Dominance Right   Extremity/Trunk Assessment Upper Extremity Assessment Upper Extremity Assessment: Overall WFL for tasks assessed   Lower Extremity Assessment Lower  Extremity Assessment: Defer to PT evaluation   Cervical / Trunk Assessment Cervical / Trunk Assessment: Normal   Communication Communication Communication: No difficulties   Cognition Arousal/Alertness: Awake/alert Behavior During Therapy: WFL for tasks assessed/performed Overall Cognitive Status: Within Functional Limits for tasks assessed                     General Comments   Pt's wife in room for entire session. Pt reported dizziness sitting EOB, deep breathing encouraged and Pt was able to tolerate for approx 8 min.     Exercises       Shoulder Instructions      Home Living Family/patient expects to be discharged to:: Inpatient rehab Living Arrangements: Spouse/significant other Available Help at Discharge: Family;Available 24 hours/day Type of Home: House Home Access: Stairs to enter [employer] of Steps: 3-4 Entrance Stairs-Rails: Right;Left;Can reach both Home Layout: One level     Bathroom Shower/Tub: Occupational psychologist: Handicapped height Bathroom Accessibility: Yes How Accessible: Accessible via wheelchair;Accessible via walker Home Equipment: [place] - 2 wheels;Crutches;Cane - single point;Shower seat;Grab bars - toilet;Grab bars - tub/shower;Hand held shower head          Prior Functioning/Environment Level of Independence: Independent                 OT Problem List: Decreased strength;Decreased activity tolerance;Impaired balance (sitting and/or standing);Decreased knowledge of use of DME or AE;Decreased knowledge of precautions;Obesity;Pain   OT Treatment/Interventions:      OT Goals(Current goals can be found in the care plan section) Acute Rehab OT Goals Patient Stated Goal: Be able to move with less pain OT Goal Formulation: With patient/family Time For Goal Achievement: 01/27/16 Potential to Achieve Goals: Good  OT Frequency: Min 2X/week   Barriers to D/C:            Co-evaluation PT/OT/SLP  Co-Evaluation/Treatment: Yes Reason for Co-Treatment: For patient/therapist safety PT goals addressed during session: Mobility/safety with mobility OT goals addressed during session: ADL's and self-care      End of Session Nurse Communication: Mobility status  Activity Tolerance: Patient limited by pain Patient left: in bed;with call bell/phone within reach;with family/visitor present   Time: 1401-1446 OT Time Calculation (min): 45 min Charges:  OT General Charges $OT Visit: 1 Procedure OT Evaluation $OT Eval Moderate Complexity: 1 Procedure G-Codes:    Merri Ray Inette Doubrava 02-15-16, 3:36 PM Hulda Humphrey OTR/L [phone]

## 2016-01-17 NOTE — Care Management Important Message (Signed)
Important Message  Patient Details  Name: Christian [name] MRN: [id number] Date of Birth: 1940/05/09   Medicare Important Message Given:  Yes    Agata Lucente 01/17/2016, 11:52 AM

## 2016-01-17 NOTE — Progress Notes (Signed)
Inpatient Rehabilitation  Met with patient and wife to discuss team's recommendation for IP Rehab.  Shared booklets and answered questions.  Patient independent and very active prior to admission and eager to regain his independence.  I await therapy evaluations and medical clearance prior to potential admission today.  Please call with questions.   Carmelia Roller., CCC/SLP Admission Coordinator  [place]  Cell [phone]

## 2016-01-17 NOTE — Consult Note (Signed)
Physical Medicine and Rehabilitation Consult Reason for Consult: Pelvic ring fracture  Referring Physician: Trauma services   HPI: Christian [name] is a 75 y.o. right handed male with history of hypertension, diabetes mellitus. Per chart review patient lives with wife. Independent prior to admission. One level home with 5 steps to entry. Presented 01/10/2016 after he was working on a tractor when it slipped into gear and pinned him between a tractor and a pole. No loss of consciousness. CT of chest abdomen and pelvis showed sagittal right sacral alar fracture extending across the right sacroiliac joint and involving the right iliac bone. Bilateral parasymphyseal fractures extending into the pubic symphysis posteriorly. Right inferior pubic ramus fracture with minimal displacement. CT retrograde urethrogram due to some hematuria completed follow-up for urology services showed a large extraperitoneal clot anterior to the bladder. Bedside cystoscopy by urology unsuccessful due to seemingly significant urethral injury near the membrane is urethra and a super pubic tube was placed with plan for fluoroscopy guided exchange and upsizing in approximately 6 weeks. Underwent transsacral screw 2 for pelvic ring fracture. Touchdown weightbearing right leg to facilitate transfers. Weightbearing as tolerated left leg for transfers only essentially bed to chair 8 weeks. Also noted sacral ulcer 6 x 6 cm in size with Dr. Erling Conte consulted and advised air mattress as well as vitamin C and zinc. Bactroban to sacral area daily. Hospital course pain management. Subcutaneous Lovenox for DVT prophylaxis. Acute blood loss anemia 7.3 and monitored. Physical therapy evaluation completed and ongoing with recommendations of physical medicine rehabilitation consult.   Review of Systems  Constitutional: Negative for chills and fever.  HENT: Negative for hearing loss.   Eyes: Negative for blurred vision and double  vision.  Respiratory: Negative for cough and shortness of breath.   Cardiovascular: Negative for chest pain, palpitations and leg swelling.  Gastrointestinal: Negative for heartburn.  Genitourinary: Positive for urgency.  Musculoskeletal: Positive for myalgias.       Right ankle pain from history of Achilles tendon injury  Skin: Negative for rash.  Neurological: Negative for seizures, loss of consciousness and headaches.  Endo/Heme/Allergies: Does not bruise/bleed easily.  Psychiatric/Behavioral: Negative for depression.  All other systems reviewed and are negative.  Past Medical History:  Diagnosis Date  . Diabetes mellitus without complication ([place])   . Hyperlipidemia   . Hypertension    Past Surgical History:  Procedure Laterality Date  . ACHILLES TENDON SURGERY Right   . TONSILLECTOMY    . ULNAR NERVE TRANSPOSITION Right    History reviewed. No pertinent family history. Social History:  reports that he has never smoked. He has never used smokeless tobacco. He reports that he does not drink alcohol. His drug history is not on file. Allergies: No Known Allergies Medications Prior to Admission  Medication Sig Dispense Refill  . amLODipine (NORVASC) 5 MG tablet Take 7.5 mg by mouth daily.    Marland Kitchen aspirin (GOODSENSE ASPIRIN) 325 MG tablet Take 325 mg by mouth daily.    Marland Kitchen atorvastatin (LIPITOR) 80 MG tablet Take 80 mg by mouth daily.    Marland Kitchen CINNAMON PO Take 1 capsule by mouth at bedtime.    . fluticasone (FLONASE) 50 MCG/ACT nasal spray Place 2 sprays into both nostrils daily.    Marland Kitchen gabapentin (NEURONTIN) 300 MG capsule Take 300 mg by mouth 3 (three) times daily.    Marland Kitchen glipiZIDE (GLUCOTROL) 5 MG tablet Take 5-10 mg by mouth See admin instructions. 10 mg in the morning  and 5 mg in the evening    . LEVEMIR FLEXTOUCH 100 UNIT/ML Pen Inject 5 Units into the skin at bedtime.     Marland Kitchen losartan (COZAAR) 50 MG tablet Take 50 mg by mouth daily.    . metFORMIN (GLUCOPHAGE) 1000 MG tablet Take 1,000  mg by mouth 2 (two) times daily.    . traMADol (ULTRAM) 50 MG tablet Take 50 mg by mouth every 6 (six) hours as needed for pain.      Home: Home Living Family/patient expects to be discharged to:: Private residence Living Arrangements: Spouse/significant other Available Help at Discharge: Family, Available 24 hours/day Home Access: Stairs to enter [employer] of Steps: 3-4 Entrance Stairs-Rails: Right Home Layout: One level Bathroom Shower/Tub: Multimedia programmer: Handicapped height Home Equipment: Grab bars - toilet, Grab bars - tub/shower  Functional History: Prior Function Level of Independence: Independent Functional Status:  Mobility: Bed Mobility Overal bed mobility: Needs Assistance, +2 for physical assistance Bed Mobility: Sit to Supine, Supine to Sit Supine to sit: Mod assist, +2 for physical assistance Sit to supine: Max assist, +2 for physical assistance General bed mobility comments: pt cued and assisted to scoot with L LE and UE's assist to lift trunk upright; then to supine assist for both legs and lowering trunk Transfers Overall transfer level: Needs assistance Equipment used: Rolling walker (2 wheeled) Transfers: Sit to/from Stand Sit to Stand: Max assist, +2 physical assistance, From elevated surface Stand pivot transfers: Mod assist, +2 physical assistance General transfer comment: Sit to stand only due to difficulty with TDWB on R. pushed up from bed with L UE      ADL: ADL Overall ADL's : Needs assistance/impaired Eating/Feeding: Modified independent, Bed level Grooming: Wash/dry hands, Wash/dry face, Oral care, Brushing hair, Set up, Bed level Upper Body Bathing: Set up, Bed level Lower Body Bathing: Maximal assistance, Bed level Upper Body Dressing : Maximal assistance, Bed level, Sitting Lower Body Dressing: Total assistance, Bed level Toilet Transfer: Total assistance Toilet Transfer Details (indicate cue type and reason):  unable to attempt  Toileting- Clothing Manipulation and Hygiene: Total assistance, Bed level Functional mobility during ADLs: Maximal assistance, Moderate assistance, +2 for physical assistance (bed mobility only )  Cognition: Cognition Overall Cognitive Status: Within Functional Limits for tasks assessed Orientation Level: Oriented X4 Cognition Arousal/Alertness: Awake/alert Behavior During Therapy: WFL for tasks assessed/performed Overall Cognitive Status: Within Functional Limits for tasks assessed  Blood pressure 139/62, pulse 80, temperature 97.6 F (36.4 C), temperature source Oral, resp. rate 18, height 6\' 2"  (1.88 m), weight 100.7 kg (222 lb), SpO2 100 %. Physical Exam  Constitutional: He is oriented to person, place, and time. He appears well-developed.  HENT:  Head: Normocephalic and atraumatic.  Right Ear: External ear normal.  Left Ear: External ear normal.  Eyes: Conjunctivae and EOM are normal. Pupils are equal, round, and reactive to light.  Neck: No tracheal deviation present. No thyromegaly present.  Cardiovascular: Normal rate, regular rhythm and normal heart sounds.   Respiratory: Effort normal and breath sounds normal. No respiratory distress. He has no wheezes. He has no rales.  GI: Soft. Bowel sounds are normal. He exhibits no distension. There is no tenderness. There is no rebound.  Suprapubic tube in place  Genitourinary: No penile tenderness.  Musculoskeletal: He exhibits edema. He exhibits no deformity.  Right leg tender to basic palpation and PROM at hip and knee.   Neurological: He is alert and oriented to person, place, and time.  UE  5/5 prox to distal. RLE: 1/5 HF, KE 3/5 ADF/PF. LLE: 3- HF, 3 KE and 4/5 ADF/PF. No sensory findings. Cognition appropriate.   Skin: Skin is warm.  Sacral wound dressed. Operative incision sites clean. Suprapubic catheter site clean and intact  Psychiatric: He has a normal mood and affect. His behavior is normal. Judgment  and thought content normal.    Results for orders placed or performed during the hospital encounter of 01/10/16 (from the past 24 hour(s))  Glucose, capillary     Status: Abnormal   Collection Time: 01/16/16 12:18 PM  Result Value Ref Range   Glucose-Capillary 303 (H) 65 - 99 mg/dL  Glucose, capillary     Status: Abnormal   Collection Time: 01/16/16  4:42 PM  Result Value Ref Range   Glucose-Capillary 299 (H) 65 - 99 mg/dL  Glucose, capillary     Status: Abnormal   Collection Time: 01/16/16  9:29 PM  Result Value Ref Range   Glucose-Capillary 389 (H) 65 - 99 mg/dL  Glucose, capillary     Status: Abnormal   Collection Time: 01/17/16  6:32 AM  Result Value Ref Range   Glucose-Capillary 233 (H) 65 - 99 mg/dL   Dg Si Joints  Result Date: 01/16/2016 CLINICAL DATA:  Inter operative pinning of the SI joints. Fluoro time reported is 2 minutes 36 seconds. Eleven fluoro spot images EXAM: BILATERAL SACROILIAC JOINTS - 3+ VIEW; DG C-ARM 61-120 MIN COMPARISON:  Pelvis series of January 13, 2016 FINDINGS: The patient has undergone placement of fixation screws across the sacrum from the right medial iliac bone to the left medial iliac bone. There is no immediate postprocedure complication. There is a drainage catheter presumably in the pelvic cavity. IMPRESSION: The patient has undergone transsacral pinning of the SI joints without evidence of immediate postprocedure complication. Electronically Signed   By: David  Martinique M.D.   On: 01/16/2016 10:43   Dg Pelvis Comp Min 3v  Result Date: 01/16/2016 CLINICAL DATA:  Prior history of fracture and surgery. EXAM: JUDET PELVIS - 3+ VIEW COMPARISON:  01/16/2016. FINDINGS: Sacroiliac screws are again noted in stable position. Pelvic catheter, most likely suprapubic catheter noted. Degenerative changes lumbar spine and both hips . No acute bony abnormality. Pelvic calcifications consistent phleboliths. IMPRESSION: Postsurgical changes of the pelvis appears stable.  Hardware intact. Degenerative changes lumbar spine and both hips. Catheter noted over the lower pelvis, most likely a suprapubic catheter. Electronically Signed   By: Marcello Moores  Register   On: 01/16/2016 12:10   Dg C-arm 1-60 Min  Result Date: 01/16/2016 CLINICAL DATA:  Inter operative pinning of the SI joints. Fluoro time reported is 2 minutes 36 seconds. Eleven fluoro spot images EXAM: BILATERAL SACROILIAC JOINTS - 3+ VIEW; DG C-ARM 61-120 MIN COMPARISON:  Pelvis series of January 13, 2016 FINDINGS: The patient has undergone placement of fixation screws across the sacrum from the right medial iliac bone to the left medial iliac bone. There is no immediate postprocedure complication. There is a drainage catheter presumably in the pelvic cavity. IMPRESSION: The patient has undergone transsacral pinning of the SI joints without evidence of immediate postprocedure complication. Electronically Signed   By: David  Martinique M.D.   On: 01/16/2016 10:43    Assessment/Plan: Diagnosis: Multiple pelvic fractures/urethral trauma due to accident on tractor 1. Does the need for close, 24 hr/day medical supervision in concert with the patient's rehab needs make it unreasonable for this patient to be served in a less intensive setting? Yes 2. Co-Morbidities requiring  supervision/potential complications: sacral decub, AKI, pain control 3. Due to bladder management, bowel management, safety, skin/wound care, disease management, medication administration, pain management and patient education, does the patient require 24 hr/day rehab nursing? Yes 4. Does the patient require coordinated care of a physician, rehab nurse, PT (1-2 hrs/day, 5 days/week) and OT (1-2 hrs/day, 5 days/week) to address physical and functional deficits in the context of the above medical diagnosis(es)? Yes Addressing deficits in the following areas: balance, endurance, locomotion, strength, transferring, bowel/bladder control, bathing, dressing, feeding,  grooming, toileting and psychosocial support 5. Can the patient actively participate in an intensive therapy program of at least 3 hrs of therapy per day at least 5 days per week? Yes 6. The potential for patient to make measurable gains while on inpatient rehab is excellent 7. Anticipated functional outcomes upon discharge from inpatient rehab are modified independent and supervision  with PT, modified independent and supervision with OT, n/a with SLP. 8. Estimated rehab length of stay to reach the above functional goals is: 12-15 9. Does the patient have adequate social supports and living environment to accommodate these discharge functional goals? Yes 10. Anticipated D/C setting: Home 11. Anticipated post D/C treatments: HH therapy and Outpatient therapy 12. Overall Rehab/Functional Prognosis: excellent  RECOMMENDATIONS: This patient's condition is appropriate for continued rehabilitative care in the following setting: CIR Patient has agreed to participate in recommended program. Yes Note that insurance prior authorization may be required for reimbursement for recommended care.  Comment: Rehab Admissions Coordinator to follow up.  Thanks,  [name], MD, Mellody Drown     01/17/2016

## 2016-01-17 NOTE — Progress Notes (Signed)
Pt refuses to turn on side due to pain. Educated on repositioning and pressure injury. Dressing was changed on sacrum. Stage II pressure ulcer on sacrum with small amount of serosanguineous drainage.   Pt is on 2 L of oxygen. Denies any pain at this time. Pt is ready to transfer to rehab.    Paulla Fore, RN

## 2016-01-17 NOTE — PMR Pre-admission (Signed)
PMR Admission Coordinator Pre-Admission Assessment  Patient: Christian [name] is an 75 y.o., male MRN: [id number] DOB: 1940/12/27 Height: 6\' 2"  (188 cm) Weight: 100.7 kg (222 lb)              Insurance Information HMO:     PPO:      PCP:      IPA:      80/20:      OTHER:  PRIMARY: Medicare A & B      Policy#: [id number]      Subscriber: Self CM Name:       Phone#:      Fax#:  Pre-Cert#:       Employer: Retired Benefits:  Phone #:      Name:  Eff. Date:  Part A 06/12/06; Part B 05/15/07     Deduct: $1,316      Out of Pocket Max: none      Life Max: none CIR: 100%      SNF: 100% days 1-20; 80% days 21-100 Outpatient: PT/OT/SLP 80%     Co-Pay: 20%  Home Health: PT/OT/SLP 100%      Co-Pay: none DME: 80%     Co-Pay: 20% Providers: patient's choice   SECONDARY: BCBS Supplement       Policy#: [id number]      Subscriber: Self CM Name:       Phone#:      Fax#:  Pre-Cert#:       Employer:  Benefits:  Phone #: [phone]     Name:  Eff. Date:      Deduct:       Out of Pocket Max:       Life Max:  CIR:       SNF:  Outpatient:      Co-Pay:  Home Health:       Co-Pay:  DME:      Co-Pay:   Medicaid Application Date:       Case Manager:  Disability Application Date:       Case Worker:   Emergency Contact Information Contact Information    Name Relation Home Work Mobile   Zucker,Barbara Spouse [phone]     [facility] Daughter [phone]       Current Medical History  Patient Admitting Diagnosis: Multiple pelvic fractures/urethral trauma due to accident on tractor  History of Present Illness: Christian [name] a 75 y.o.right handed malewith history of hypertension, diabetes mellitus.Per chart review patient lives with wife and was independent prior to admission. Presented 01/10/2016 after he was working on a tractor when it slipped into gear and pinned him between a tractor and a pole. No loss of consciousness. CT of chest abdomen and pelvis showed sagittal right sacral alarfracture  extending across the right sacroiliac joint and involving the right iliac bone. Bilateral parasymphyseal fractures extending into the pubic symphysis posteriorly. Right inferior pubic ramus fracture with minimal displacement. CT retrograde urethrogram due to some hematuria completed follow-up for urology services showed a large extraperitoneal clot anterior to the bladder. Bedside cystoscopy by urology unsuccessful due to seemingly significant urethral injury near the membrane is urethra and a super pubic tube was placed with plan for fluoroscopy guided exchange and upsizing in approximately 6 weeks. Underwent transsacral screw 2 for pelvic ring fracture. Touchdown weightbearing right leg to facilitate transfers. Weightbearing as tolerated left leg for transfers only essentially bed to chair 8 weeks. Also noted sacral ulcer 6 x 6 cm in size with Dr. Marla Roe consulted 01/16/2016 and advised  air mattress as well as vitamin C and zinc. Bactroban to sacral area daily. Hospital course pain management. Subcutaneous Lovenox for DVT prophylaxis. Acute blood loss anemia 7.3 and monitored. Physical therapy evaluation completed and ongoing with recommendations of physical medicine rehabilitation consult. Patient was admitted for a comprehensive rehab program 01/17/16.      Past Medical History  Past Medical History:  Diagnosis Date  . Diabetes mellitus without complication ([place])   . Hyperlipidemia   . Hypertension     Family History  family history is not on file.  Prior Rehab/Hospitalizations:  Has the patient had major surgery during 100 days prior to admission? No  Current Medications   Current Facility-Administered Medications:  .  acetaminophen (TYLENOL) tablet 650 mg, 650 mg, Oral, Q4H PRN, Donnie Mesa, MD, 650 mg at 01/12/16 1927 .  amLODipine (NORVASC) tablet 7.5 mg, 7.5 mg, Oral, Daily, Donnie Mesa, MD, 7.5 mg at 01/17/16 0851 .  atorvastatin (LIPITOR) tablet 80 mg, 80 mg, Oral, Daily,  Donnie Mesa, MD, 80 mg at 01/17/16 0850 .  calcium carbonate (OS-CAL - dosed in mg of elemental calcium) tablet 500 mg of elemental calcium, 1 tablet, Oral, TID WC, Georganna Skeans, MD, 500 mg of elemental calcium at 01/17/16 1104 .  docusate sodium (COLACE) capsule 100 mg, 100 mg, Oral, BID, [name], PA-C, 100 mg at 01/17/16 0851 .  enoxaparin (LOVENOX) injection 30 mg, 30 mg, Subcutaneous, Q12H, [name], PA-C, 30 mg at 01/17/16 0853 .  feeding supplement (GLUCERNA SHAKE) (GLUCERNA SHAKE) liquid 237 mL, 237 mL, Oral, TID BM, Claire S Dillingham, DO, 237 mL at 01/17/16 1357 .  feeding supplement (PRO-STAT SUGAR FREE 64) liquid 30 mL, 30 mL, Oral, BID, Claire S Dillingham, DO, 30 mL at 01/17/16 0853 .  ferrous gluconate (FERGON) tablet 324 mg, 324 mg, Oral, BID WC, Judeth Horn, MD, 324 mg at 01/17/16 0848 .  fluticasone (FLONASE) 50 MCG/ACT nasal spray 2 spray, 2 spray, Each Nare, Daily, Donnie Mesa, MD, 2 spray at 01/17/16 0854 .  gabapentin (NEURONTIN) capsule 300 mg, 300 mg, Oral, TID, Donnie Mesa, MD, 300 mg at 01/17/16 0852 .  glipiZIDE (GLUCOTROL) tablet 10 mg, 10 mg, Oral, QAC breakfast, 10 mg at 01/17/16 0850 **AND** glipiZIDE (GLUCOTROL) tablet 5 mg, 5 mg, Oral, QAC supper, Karmen Bongo, MD, 5 mg at 01/16/16 1650 .  HYDROmorphone (DILAUDID) injection 0.5-1 mg, 0.5-1 mg, Intravenous, Q4H PRN, Judeth Horn, MD, 1 mg at 01/17/16 1401 .  insulin aspart (novoLOG) injection 0-20 Units, 0-20 Units, Subcutaneous, TID WC, [name], PA-C, 3 Units at 01/17/16 1359 .  insulin aspart (novoLOG) injection 0-5 Units, 0-5 Units, Subcutaneous, QHS, Donnie Mesa, MD, 5 Units at 01/16/16 2314 .  insulin aspart (novoLOG) injection 4 Units, 4 Units, Subcutaneous, TID WC, Donnie Mesa, MD, 4 Units at 01/17/16 1357 .  insulin detemir (LEVEMIR) injection 15 Units, 15 Units, Subcutaneous, QHS, [name], PA-C .  metFORMIN (GLUCOPHAGE) tablet 1,000 mg, 1,000 mg, Oral, BID WC,  Donnie Mesa, MD, 1,000 mg at 01/17/16 0849 .  multivitamins with iron tablet 1 tablet, 1 tablet, Oral, Daily, Judeth Horn, MD, 1 tablet at 01/17/16 1104 .  mupirocin cream (BACTROBAN) 2 %, , Topical, Daily, Ainsley Spinner, PA-C .  ondansetron [facility]) tablet 4 mg, 4 mg, Oral, Q6H PRN **OR** ondansetron (ZOFRAN) injection 4 mg, 4 mg, Intravenous, Q6H PRN, Donnie Mesa, MD, 4 mg at 01/11/16 [id number] .  pantoprazole (PROTONIX) EC tablet 40 mg, 40 mg, Oral, BID, 40  mg at 01/17/16 0852 **OR** [DISCONTINUED] famotidine (PEPCID) IVPB 20 mg premix, 20 mg, Intravenous, BID, Donnie Mesa, MD .  polyethylene glycol (MIRALAX / GLYCOLAX) packet 17 g, 17 g, Oral, Daily, [name], PA-C, 17 g at 01/17/16 0853 .  traMADol (ULTRAM) tablet 50-100 mg, 50-100 mg, Oral, Q6H PRN, [name], PA-C, 50 mg at 01/17/16 1113 .  vitamin C (ASCORBIC ACID) tablet 500 mg, 500 mg, Oral, BID, Loel Lofty Dillingham, DO, 500 mg at 01/17/16 0848 .  zinc sulfate capsule 220 mg, 220 mg, Oral, Daily, Loel Lofty Dillingham, DO, 220 mg at 01/17/16 [id number]  Patients Current Diet: Diet Carb Modified Fluid consistency: Thin; Room service appropriate? Yes  Precautions / Restrictions Precautions Precautions: Fall Restrictions Weight Bearing Restrictions: Yes RLE Weight Bearing: Non weight bearing LLE Weight Bearing: Weight bearing as tolerated Other Position/Activity Restrictions: LLE WBAT for transfers only   Has the patient had 2 or more falls or a fall with injury in the past year?No  Prior Activity Level Community (5-7x/wk): Prior to admission patient was independent and active.  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  He and his wife of 2 years traveled to visit children, danced on Saturdays, and enjoyed spending time together.  Home Assistive Devices / Equipment Home Assistive Devices/Equipment: Hearing aid (hearing aids missing prior to coming to hospital) Home Equipment: Environmental consultant - 2 wheels,  Crutches, [place] - single point, Shower seat, Grab bars - toilet, Grab bars - tub/shower, Hand held shower head  Prior Device Use: Indicate devices/aids used by the patient prior to current illness, exacerbation or injury? None of the above  Prior Functional Level Prior Function Level of Independence: Independent  Self Care: Did the patient need help bathing, dressing, using the toilet or eating?  Independent  Indoor Mobility: Did the patient need assistance with walking from room to room (with or without device)? Independent  Stairs: Did the patient need assistance with internal or external stairs (with or without device)? Independent  Functional Cognition: Did the patient need help planning regular tasks such as shopping or remembering to take medications? Independent  Current Functional Level Cognition  Overall Cognitive Status: Within Functional Limits for tasks assessed Orientation Level: Oriented X4    Extremity Assessment (includes Sensation/Coordination)  Upper Extremity Assessment: Overall WFL for tasks assessed  Lower Extremity Assessment: Defer to PT evaluation RLE Deficits / Details: AAROM grossly WFL with hip in abducted position, no attempt to adduct the leg due to pain and edema in groin; strength knee extension 3/5 LLE Deficits / Details: AAROM grossly WFL with hip in abducted position, no attempt to adduct the leg due to pain and edema in groin; strength hip flexion 3/5 with pain, knee extension 4-/5    ADLs  Overall ADL's : Needs assistance/impaired Eating/Feeding: Modified independent, Bed level Grooming: Wash/dry face, Total assistance, Sitting, Set up, Bed level (Pt requires BUE support sitting EOB) Grooming Details (indicate cue type and reason): Pt able to perform grooming at bed level with set up, when sitting requires BUE support and so is total assist for grooming Upper Body Bathing: Set up, Bed level Lower Body Bathing: Maximal assistance, Bed level Upper  Body Dressing : Maximal assistance, Bed level, Sitting Lower Body Dressing: Total assistance, Bed level Toilet Transfer: Total assistance, +2 for physical assistance, +2 for safety/equipment Toilet Transfer Details (indicate cue type and reason): unable to attempt  Toileting- Clothing Manipulation and Hygiene: Total assistance, Bed level Functional mobility  during ADLs: +2 for physical assistance, +2 for safety/equipment, Maximal assistance General ADL Comments: Pt willing and able to work with therapy. Pt limited by pain, but active participant and motivated to improve independence with ADL.    Mobility  Overal bed mobility: +2 for physical assistance, Needs Assistance Bed Mobility: Sit to Supine, Supine to Sit Supine to sit: +2 for physical assistance, Max assist Sit to supine: +2 for physical assistance, Max assist General bed mobility comments: assist needed at trunk and LEs to come to sitting EOB, using bed pad to assist with pivot. Pt assisting as able with UEs.     Transfers  Overall transfer level: Needs assistance Equipment used: Rolling walker (2 wheeled) Transfers: Sit to/from Stand Sit to Stand: Max assist, +2 physical assistance, From elevated surface Stand pivot transfers: Mod assist, +2 physical assistance General transfer comment: Pt declined to attempt at this time.     Ambulation / Gait / Stairs / Office manager / Balance Dynamic Sitting Balance Sitting balance - Comments: leans back on UE's for support Balance Overall balance assessment: Needs assistance Sitting-balance support: Bilateral upper extremity supported, Feet supported Sitting balance-Leahy Scale: Poor Sitting balance - Comments: leans back on UE's for support Standing balance support: Bilateral upper extremity supported Standing balance-Leahy Scale: Poor Standing balance comment: assist and UE support for balance; able to sit without support today for about 3-4 minutes    Special  needs/care consideration BiPAP/CPAP No CPM: No Continuous Drip IV: No Dialysis: No        Life Vest: No Oxygen: No PTA; however, now on nasal cannula Special Bed: *per Plastic Surgery patient needs an air mattress for sacral wound Trach Size: No Wound Vac (area): No       Skin: Bruising right side, back, pelvis, scrotum; Wound sacrum; Surgical site suprapubic pelvis                               Bowel mgmt: 01/16/16 Bladder mgmt: Suprapubic catheter  Diabetic mgmt: Yes, managed with diet, oral medication, and Levemir pen prior to admission      Previous Home Environment Living Arrangements: Spouse/significant other Available Help at Discharge: Family, Available 24 hours/day Type of Home: House Home Layout: One level Home Access: Stairs to enter Entrance Stairs-Rails: Right, Left, Can reach both Entrance Stairs-Number of Steps: 3-4 Bathroom Shower/Tub: Multimedia programmer: Handicapped height Bathroom Accessibility: Yes How Accessible: Accessible via wheelchair, Accessible via [place]: No  Discharge Living Setting Plans for Discharge Living Setting: Other (Comment) (wife's home (married for 2 years he still has his place too)) Type of Home at Discharge: House Discharge Home Layout: One level Discharge Home Access: Stairs to enter Entrance Stairs-Rails: Can reach both Entrance Stairs-Number of Steps: 3 Discharge Bathroom Shower/Tub: Walk-in shower Discharge Bathroom Toilet: Handicapped height Discharge Bathroom Accessibility: Yes How Accessible: Accessible via walker Does the patient have any problems obtaining your medications?: No  Social/Family/Support Systems Patient Roles: Spouse, Parent Contact Information: [place], [place] [phone] Anticipated Caregiver: Spouse Anticipated Caregiver's Contact Information: see above Ability/Limitations of Caregiver: physical Min assist  Caregiver Availability: 24/7 Discharge Plan Discussed with  Primary Caregiver: Yes Is Caregiver In Agreement with Plan?: Yes Does Caregiver/Family have Issues with Lodging/Transportation while Pt is in Rehab?: No  Goals/Additional Needs Patient/Family Goal for Rehab: OT/PT Mod I-Supervision  Expected length of stay: 12-15 days Cultural Considerations: [employer] Dietary Needs:  Carb Mod Medium  Equipment Needs: TBD Special Service Needs: None Additional Information: Being followed by Plastics for sacral wound and Urology for suprapubic cath Pt/Family Agrees to Admission and willing to participate: Yes Program Orientation Provided & Reviewed with Pt/Caregiver Including Roles  & Responsibilities: Yes Additional Information Needs: Follow up from Urology and Plastics  Information Needs to be Provided By: Team  Decrease burden of Care through IP rehab admission: No  Possible need for SNF placement upon discharge: No  Patient Condition: This patient's condition remains as documented in the consult dated 01/17/16, in which the Rehabilitation Physician determined and documented that the patient's condition is appropriate for intensive rehabilitative care in an inpatient rehabilitation facility. Will admit to inpatient rehab today.  Preadmission Screen Completed By:  Gunnar Fusi, 01/17/2016 3:35 PM ______________________________________________________________________   Discussed status with Dr. Naaman Plummer on 01/17/16 at 1535 and received telephone approval for admission today.  Admission Coordinator:  Gunnar Fusi, time 1535/Date 01/17/16

## 2016-01-17 NOTE — Progress Notes (Signed)
Christian Staggers, MD Physician Signed Physical Medicine and Rehabilitation  Consult Note Date of Service: 01/17/2016 10:57 AM  Related encounter: ED to Hosp-Admission (Current) from 01/10/2016 in [place] All Collapse All   [] Hide copied text [] Hover for attribution information      Physical Medicine and Rehabilitation Consult Reason for Consult: Pelvic ring fracture  Referring Physician: Trauma services   HPI: Christian [name] is a 75 y.o. right handed male with history of hypertension, diabetes mellitus. Per chart review patient lives with wife. Independent prior to admission. One level home with 5 steps to entry. Presented 01/10/2016 after he was working on a tractor when it slipped into gear and pinned him between a tractor and a pole. No loss of consciousness. CT of chest abdomen and pelvis showed sagittal right sacral alar fracture extending across the right sacroiliac joint and involving the right iliac bone. Bilateral parasymphyseal fractures extending into the pubic symphysis posteriorly. Right inferior pubic ramus fracture with minimal displacement. CT retrograde urethrogram due to some hematuria completed follow-up for urology services showed a large extraperitoneal clot anterior to the bladder. Bedside cystoscopy by urology unsuccessful due to seemingly significant urethral injury near the membrane is urethra and a super pubic tube was placed with plan for fluoroscopy guided exchange and upsizing in approximately 6 weeks. Underwent transsacral screw 2 for pelvic ring fracture. Touchdown weightbearing right leg to facilitate transfers. Weightbearing as tolerated left leg for transfers only essentially bed to chair 8 weeks. Also noted sacral ulcer 6 x 6 cm in size with Dr. Erling Conte consulted and advised air mattress as well as vitamin C and zinc. Bactroban to sacral area daily. Hospital course pain management. Subcutaneous Lovenox  for DVT prophylaxis. Acute blood loss anemia 7.3 and monitored. Physical therapy evaluation completed and ongoing with recommendations of physical medicine rehabilitation consult.   Review of Systems  Constitutional: Negative for chills and fever.  HENT: Negative for hearing loss.   Eyes: Negative for blurred vision and double vision.  Respiratory: Negative for cough and shortness of breath.   Cardiovascular: Negative for chest pain, palpitations and leg swelling.  Gastrointestinal: Negative for heartburn.  Genitourinary: Positive for urgency.  Musculoskeletal: Positive for myalgias.       Right ankle pain from history of Achilles tendon injury  Skin: Negative for rash.  Neurological: Negative for seizures, loss of consciousness and headaches.  Endo/Heme/Allergies: Does not bruise/bleed easily.  Psychiatric/Behavioral: Negative for depression.  All other systems reviewed and are negative.      Past Medical History:  Diagnosis Date  . Diabetes mellitus without complication ([place])   . Hyperlipidemia   . Hypertension         Past Surgical History:  Procedure Laterality Date  . ACHILLES TENDON SURGERY Right   . TONSILLECTOMY    . ULNAR NERVE TRANSPOSITION Right    History reviewed. No pertinent family history. Social History:  reports that he has never smoked. He has never used smokeless tobacco. He reports that he does not drink alcohol. His drug history is not on file. Allergies: No Known Allergies       Medications Prior to Admission  Medication Sig Dispense Refill  . amLODipine (NORVASC) 5 MG tablet Take 7.5 mg by mouth daily.    Marland Kitchen aspirin (GOODSENSE ASPIRIN) 325 MG tablet Take 325 mg by mouth daily.    Marland Kitchen atorvastatin (LIPITOR) 80 MG tablet Take 80 mg by mouth daily.    Marland Kitchen  CINNAMON PO Take 1 capsule by mouth at bedtime.    . fluticasone (FLONASE) 50 MCG/ACT nasal spray Place 2 sprays into both nostrils daily.    Marland Kitchen gabapentin (NEURONTIN) 300 MG capsule  Take 300 mg by mouth 3 (three) times daily.    Marland Kitchen glipiZIDE (GLUCOTROL) 5 MG tablet Take 5-10 mg by mouth See admin instructions. 10 mg in the morning and 5 mg in the evening    . LEVEMIR FLEXTOUCH 100 UNIT/ML Pen Inject 5 Units into the skin at bedtime.     Marland Kitchen losartan (COZAAR) 50 MG tablet Take 50 mg by mouth daily.    . metFORMIN (GLUCOPHAGE) 1000 MG tablet Take 1,000 mg by mouth 2 (two) times daily.    . traMADol (ULTRAM) 50 MG tablet Take 50 mg by mouth every 6 (six) hours as needed for pain.      Home: Home Living Family/patient expects to be discharged to:: Private residence Living Arrangements: Spouse/significant other Available Help at Discharge: Family, Available 24 hours/day Home Access: Stairs to enter [employer] of Steps: 3-4 Entrance Stairs-Rails: Right Home Layout: One level Bathroom Shower/Tub: Multimedia programmer: Handicapped height Home Equipment: Grab bars - toilet, Grab bars - tub/shower  Functional History: Prior Function Level of Independence: Independent Functional Status:  Mobility: Bed Mobility Overal bed mobility: Needs Assistance, +2 for physical assistance Bed Mobility: Sit to Supine, Supine to Sit Supine to sit: Mod assist, +2 for physical assistance Sit to supine: Max assist, +2 for physical assistance General bed mobility comments: pt cued and assisted to scoot with L LE and UE's assist to lift trunk upright; then to supine assist for both legs and lowering trunk Transfers Overall transfer level: Needs assistance Equipment used: Rolling walker (2 wheeled) Transfers: Sit to/from Stand Sit to Stand: Max assist, +2 physical assistance, From elevated surface Stand pivot transfers: Mod assist, +2 physical assistance General transfer comment: Sit to stand only due to difficulty with TDWB on R. pushed up from bed with L UE      ADL: ADL Overall ADL's : Needs assistance/impaired Eating/Feeding: Modified  independent, Bed level Grooming: Wash/dry hands, Wash/dry face, Oral care, Brushing hair, Set up, Bed level Upper Body Bathing: Set up, Bed level Lower Body Bathing: Maximal assistance, Bed level Upper Body Dressing : Maximal assistance, Bed level, Sitting Lower Body Dressing: Total assistance, Bed level Toilet Transfer: Total assistance Toilet Transfer Details (indicate cue type and reason): unable to attempt  Toileting- Clothing Manipulation and Hygiene: Total assistance, Bed level Functional mobility during ADLs: Maximal assistance, Moderate assistance, +2 for physical assistance (bed mobility only )  Cognition: Cognition Overall Cognitive Status: Within Functional Limits for tasks assessed Orientation Level: Oriented X4 Cognition Arousal/Alertness: Awake/alert Behavior During Therapy: WFL for tasks assessed/performed Overall Cognitive Status: Within Functional Limits for tasks assessed  Blood pressure 139/62, pulse 80, temperature 97.6 F (36.4 C), temperature source Oral, resp. rate 18, height 6\' 2"  (1.88 m), weight 100.7 kg (222 lb), SpO2 100 %. Physical Exam  Constitutional: He is oriented to person, place, and time. He appears well-developed.  HENT:  Head: Normocephalic and atraumatic.  Right Ear: External ear normal.  Left Ear: External ear normal.  Eyes: Conjunctivae and EOM are normal. Pupils are equal, round, and reactive to light.  Neck: No tracheal deviation present. No thyromegaly present.  Cardiovascular: Normal rate, regular rhythm and normal heart sounds.   Respiratory: Effort normal and breath sounds normal. No respiratory distress. He has no wheezes. He has no rales.  GI: Soft. Bowel sounds are normal. He exhibits no distension. There is no tenderness. There is no rebound.  Suprapubic tube in place  Genitourinary: No penile tenderness.  Musculoskeletal: He exhibits edema. He exhibits no deformity.  Right leg tender to basic palpation and PROM at hip and knee.    Neurological: He is alert and oriented to person, place, and time.  UE 5/5 prox to distal. RLE: 1/5 HF, KE 3/5 ADF/PF. LLE: 3- HF, 3 KE and 4/5 ADF/PF. No sensory findings. Cognition appropriate.   Skin: Skin is warm.  Sacral wound dressed. Operative incision sites clean. Suprapubic catheter site clean and intact  Psychiatric: He has a normal mood and affect. His behavior is normal. Judgment and thought content normal.    Lab Results Last 24 Hours       Results for orders placed or performed during the hospital encounter of 01/10/16 (from the past 24 hour(s))  Glucose, capillary     Status: Abnormal   Collection Time: 01/16/16 12:18 PM  Result Value Ref Range   Glucose-Capillary 303 (H) 65 - 99 mg/dL  Glucose, capillary     Status: Abnormal   Collection Time: 01/16/16  4:42 PM  Result Value Ref Range   Glucose-Capillary 299 (H) 65 - 99 mg/dL  Glucose, capillary     Status: Abnormal   Collection Time: 01/16/16  9:29 PM  Result Value Ref Range   Glucose-Capillary 389 (H) 65 - 99 mg/dL  Glucose, capillary     Status: Abnormal   Collection Time: 01/17/16  6:32 AM  Result Value Ref Range   Glucose-Capillary 233 (H) 65 - 99 mg/dL      Imaging Results (Last 48 hours)  Dg Si Joints  Result Date: 01/16/2016 CLINICAL DATA:  Inter operative pinning of the SI joints. Fluoro time reported is 2 minutes 36 seconds. Eleven fluoro spot images EXAM: BILATERAL SACROILIAC JOINTS - 3+ VIEW; DG C-ARM 61-120 MIN COMPARISON:  Pelvis series of January 13, 2016 FINDINGS: The patient has undergone placement of fixation screws across the sacrum from the right medial iliac bone to the left medial iliac bone. There is no immediate postprocedure complication. There is a drainage catheter presumably in the pelvic cavity. IMPRESSION: The patient has undergone transsacral pinning of the SI joints without evidence of immediate postprocedure complication. Electronically Signed   By: David  Martinique M.D.    On: 01/16/2016 10:43   Dg Pelvis Comp Min 3v  Result Date: 01/16/2016 CLINICAL DATA:  Prior history of fracture and surgery. EXAM: JUDET PELVIS - 3+ VIEW COMPARISON:  01/16/2016. FINDINGS: Sacroiliac screws are again noted in stable position. Pelvic catheter, most likely suprapubic catheter noted. Degenerative changes lumbar spine and both hips . No acute bony abnormality. Pelvic calcifications consistent phleboliths. IMPRESSION: Postsurgical changes of the pelvis appears stable. Hardware intact. Degenerative changes lumbar spine and both hips. Catheter noted over the lower pelvis, most likely a suprapubic catheter. Electronically Signed   By: Marcello Moores  Register   On: 01/16/2016 12:10   Dg C-arm 1-60 Min  Result Date: 01/16/2016 CLINICAL DATA:  Inter operative pinning of the SI joints. Fluoro time reported is 2 minutes 36 seconds. Eleven fluoro spot images EXAM: BILATERAL SACROILIAC JOINTS - 3+ VIEW; DG C-ARM 61-120 MIN COMPARISON:  Pelvis series of January 13, 2016 FINDINGS: The patient has undergone placement of fixation screws across the sacrum from the right medial iliac bone to the left medial iliac bone. There is no immediate postprocedure complication. There is a drainage catheter  presumably in the pelvic cavity. IMPRESSION: The patient has undergone transsacral pinning of the SI joints without evidence of immediate postprocedure complication. Electronically Signed   By: David  Martinique M.D.   On: 01/16/2016 10:43     Assessment/Plan: Diagnosis: Multiple pelvic fractures/urethral trauma due to accident on tractor 1. Does the need for close, 24 hr/day medical supervision in concert with the patient's rehab needs make it unreasonable for this patient to be served in a less intensive setting? Yes 2. Co-Morbidities requiring supervision/potential complications: sacral decub, AKI, pain control 3. Due to bladder management, bowel management, safety, skin/wound care, disease management, medication  administration, pain management and patient education, does the patient require 24 hr/day rehab nursing? Yes 4. Does the patient require coordinated care of a physician, rehab nurse, PT (1-2 hrs/day, 5 days/week) and OT (1-2 hrs/day, 5 days/week) to address physical and functional deficits in the context of the above medical diagnosis(es)? Yes Addressing deficits in the following areas: balance, endurance, locomotion, strength, transferring, bowel/bladder control, bathing, dressing, feeding, grooming, toileting and psychosocial support 5. Can the patient actively participate in an intensive therapy program of at least 3 hrs of therapy per day at least 5 days per week? Yes 6. The potential for patient to make measurable gains while on inpatient rehab is excellent 7. Anticipated functional outcomes upon discharge from inpatient rehab are modified independent and supervision  with PT, modified independent and supervision with OT, n/a with SLP. 8. Estimated rehab length of stay to reach the above functional goals is: 12-15 9. Does the patient have adequate social supports and living environment to accommodate these discharge functional goals? Yes 10. Anticipated D/C setting: Home 11. Anticipated post D/C treatments: HH therapy and Outpatient therapy 12. Overall Rehab/Functional Prognosis: excellent  RECOMMENDATIONS: This patient's condition is appropriate for continued rehabilitative care in the following setting: CIR Patient has agreed to participate in recommended program. Yes Note that insurance prior authorization may be required for reimbursement for recommended care.  Comment: Rehab Admissions Coordinator to follow up.  Thanks,  Christian Staggers, MD, Mellody Drown     01/17/2016    Revision History

## 2016-01-17 NOTE — Discharge Summary (Signed)
Physician Discharge Summary  Patient ID: Christian [name] MRN: [id number] DOB/AGE: 1940/06/10 75 y.o.  Admit date: 01/10/2016 Discharge date: 01/17/2016  Discharge Diagnoses Patient Active Problem List   Diagnosis Date Noted  . Sacral decubitus ulcer 01/17/2016  . Accident caused by farm tractor 01/15/2016  . Urethral injury 01/15/2016  . Acute blood loss anemia 01/15/2016  . Acute kidney injury ([place]) 01/15/2016  . Pelvic fracture ([place]) 01/10/2016    Consultants Drs. Jean Rosenthal and [facility] for orthopedic surgery  Dr. Nicki Reaper MacDiarmid for urology  Dr. Audelia Hives for plastic surgery  Dr. Alger Simons for PM&R   Procedures 9/29 -- Placement of suprapubic tube by Dr. Ronny Bacon  10/5 -- Transsacroiliac pinning by Dr. Marcelino Scot   HPI: Christian [name] was working on a tractor when it slipped into gear and pinned him between the tractor and a pole. The patient had gross hematuria and bleeding from the tip of his penis, abrasions of both hips, and right hip tenderness. He was brought in as a non-trauma code but had some hypotension and was upgraded to a level 1 trauma code. His BP responded to some volume. His workup included CT scans of the head, cervical spine, chest, abdomen, and pelvis as well as extremity x-rays and a retrograde urethrogram which showed the above-mentioned injuries. He was admitted by the trauma service and orthopedic surgery and urology were consulted. He had a suprapubic catheter placed by interventional radiology.    Hospital Course: Orthopedic surgery recommended operative fixation of his pelvic fractures and turned care over to the traumatic orthopedic specialist. He developed some mild rhabdomyolysis and hyperkalemia that was treated with hydration, Lasix, and Kayexalate. He was mobilized with physical and occupational therapies who recommended inpatient rehabilitation. They were consulted and agreed with admission once his pelvis had been stabilized. He  developed an acute blood loss anemia and did require transfusion of 2 units of packed red blood cells. His blood sugars were consistently elevated and required titration of his long-acting insulin. He was taken to surgery about a week after admission. He had developed a large decubitus ulcer on his sacrum. Plastic surgery was consulted and recommended non-operative treatment to start. He did well with surgery and was able to be discharged to inpatient rehabilitation in good condition.  He will need to have a scheduled suprapubic drain exchange with interventional radiology about 4 weeks from the date of his accident.   Medications Scheduled Meds: . amLODipine  7.5 mg Oral Daily  . atorvastatin  80 mg Oral Daily  . calcium carbonate  1 tablet Oral TID WC  . docusate sodium  100 mg Oral BID  . enoxaparin (LOVENOX) injection  30 mg Subcutaneous Q12H  . feeding supplement (GLUCERNA SHAKE)  237 mL Oral TID BM  . feeding supplement (PRO-STAT SUGAR FREE 64)  30 mL Oral BID  . ferrous gluconate  324 mg Oral BID WC  . fluticasone  2 spray Each Nare Daily  . gabapentin  300 mg Oral TID  . glipiZIDE  10 mg Oral QAC breakfast   And  . glipiZIDE  5 mg Oral QAC supper  . insulin aspart  0-20 Units Subcutaneous TID WC  . insulin aspart  0-5 Units Subcutaneous QHS  . insulin aspart  4 Units Subcutaneous TID WC  . insulin detemir  15 Units Subcutaneous QHS  . metFORMIN  1,000 mg Oral BID WC  . multivitamins with iron  1 tablet Oral Daily  . mupirocin cream   Topical  Daily  . pantoprazole  40 mg Oral BID  . polyethylene glycol  17 g Oral Daily  . vitamin C  500 mg Oral BID  . zinc sulfate  220 mg Oral Daily   Continuous Infusions:  PRN Meds:.acetaminophen, HYDROmorphone (DILAUDID) injection, ondansetron **OR** ondansetron (ZOFRAN) IV, traMADol   Follow-up Information    HANDY,Shona Pardo H, MD. Schedule an appointment as soon as possible for a visit today.   Specialty:  Orthopedic Surgery Contact  information: [address] [place] [address] [phone]        [place] .   Why:  Call as needed Contact information: [address] [id number] [place] [id number]           Signed: [name], PA-C Pager: [phone] General Trauma PA Pager: [phone] 01/17/2016, 3:09 PM

## 2016-01-17 NOTE — Progress Notes (Signed)
Physical Therapy Treatment Patient Details Name: Christian [name] MRN: [id number] DOB: 1941-01-13 Today's Date: 01/17/2016    History of Present Illness This 75 y.o. male admitted after tractor rolled into him and pinned him against a pole.  Pelvic CT showed:  Sagittal right sacral ala fracture extending across the right SI join to right iliac bone, Bilateral parasymphyseal fxs extending into pubic symphysis, Right inferior pubic ramus fracture, Urethral injury - s/p SP tube placement by IR. Pt s/p SACROILIAC SCREW FIXATION ACROSS THE RIGHT AND LEFT AT S1 AND S2 on 01/16/16. PMH: HTN, diabetes, Rt achilles surgery.     PT Comments    Pt undergoing sacroiliac fixation across Rt/Lt at S1 and S2 on 01/16/16. Pt receiving orders to continue with skilled PT. Pt able to sit EOB with +2 max assist. Due to reports of pain and dizziness, unable to attempt standing. Pt putting forth good effort throughout session. PT recommending CIR for further rehabilitation following acute stay.   Follow Up Recommendations  CIR     Equipment Recommendations   (to be assessed at next venue)    Recommendations for Other Services       Precautions / Restrictions Precautions Precautions: Fall Restrictions Weight Bearing Restrictions: Yes RLE Weight Bearing: Non weight bearing LLE Weight Bearing: Weight bearing as tolerated Other Position/Activity Restrictions: LLE WBAT for transfers only    Mobility  Bed Mobility Overal bed mobility: +2 for physical assistance;Needs Assistance Bed Mobility: Sit to Supine;Supine to Sit     Supine to sit: +2 for physical assistance;Max assist Sit to supine: +2 for physical assistance;Max assist   General bed mobility comments: assist needed at trunk and LEs to come to sitting EOB, using bed pad to assist with pivot. Pt assisting as able with UEs.   Transfers                 General transfer comment: Pt declined to attempt at this time.   Ambulation/Gait                  Stairs            Wheelchair Mobility    Modified Rankin (Stroke Patients Only)       Balance Overall balance assessment: Needs assistance Sitting-balance support: Bilateral upper extremity supported Sitting balance-Leahy Scale: Poor                              Cognition Arousal/Alertness: Awake/alert Behavior During Therapy: WFL for tasks assessed/performed Overall Cognitive Status: Within Functional Limits for tasks assessed                      Exercises      General Comments General comments (skin integrity, edema, etc.): Pt reports feeling dizzy with sitting EOB, gradually improving over time. Pt able to sit EOB approx. 8 minutes.       Pertinent Vitals/Pain Pain Assessment: 0-10 Pain Score: 7  Pain Location: pelvis/back  Pain Descriptors / Indicators: Throbbing;Sore Pain Intervention(s): Limited activity within patient's tolerance;Monitored during session;Premedicated before session    [place] expects to be discharged to:: Private residence Living Arrangements: Spouse/significant other Available Help at Discharge: Family;Available 24 hours/day Type of Home: House Home Access: Stairs to enter Entrance Stairs-Rails: Right;Left;Can reach both Home Layout: One level Home Equipment: Walker - 2 wheels;Crutches;Cane - single point;Shower seat;Grab bars - toilet;Grab bars - tub/shower;Hand held shower head  Prior Function Level of Independence: Independent          PT Goals (current goals can now be found in the care plan section) Acute Rehab PT Goals Patient Stated Goal: Be able to move with less pain PT Goal Formulation: With patient/family Time For Goal Achievement: 01/24/16 Potential to Achieve Goals: Good Progress towards PT goals: Progressing toward goals    Frequency    Min 5X/week      PT Plan Current plan remains appropriate    Co-evaluation PT/OT/SLP Co-Evaluation/Treatment:  Yes Reason for Co-Treatment: For patient/therapist safety PT goals addressed during session: Mobility/safety with mobility       End of Session Equipment Utilized During Treatment: Oxygen Activity Tolerance: Patient limited by pain (dizziness) Patient left: in bed;with call bell/phone within reach;with family/visitor present;with SCD's reapplied     Time: [id number] PT Time Calculation (min) (ACUTE ONLY): 41 min  Charges:  $Therapeutic Activity: 23-37 mins                    G Codes:      Cassell Clement, PT, CSCS Pager [phone] Office [phone]  01/17/2016, 2:52 PM

## 2016-01-17 NOTE — Progress Notes (Addendum)
Inpatient Diabetes Program Recommendations  AACE/ADA: New Consensus Statement on Inpatient Glycemic Control (2015)  Target Ranges:  Prepandial:   less than 140 mg/dL      Peak postprandial:   less than 180 mg/dL (1-2 hours)      Critically ill patients:  140 - 180 mg/dL   Lab Results  Component Value Date   GLUCAP 233 (H) 01/17/2016   HGBA1C 7.9 (H) 01/11/2016  Results for Christian [name], Christian [name] (MRN [id number]) as of 01/17/2016 07:35  Ref. Range 01/16/2016 05:59 01/16/2016 12:18 01/16/2016 16:42 01/16/2016 21:29 01/17/2016 06:32  Glucose-Capillary Latest Ref Range: 65 - 99 mg/dL 160 (H) 303 (H) 299 (H) 389 (H) 233 (H)    Home DM Meds: Metformin 1000 mg bid Glipizide 10 mg AM/ 5 mg PM  Current Insulin Orders: Metformin 1000 mg bid, Glipizide 10 mg AM/ 5 mg PM, Novolog Moderate Correction Scale 0-15 units tid, Novolog 0-5 units qhs, Novolog 4 units tid, Levemir 10 units qhs  Elevated CBG over the past 24 hours likely as a result of steroids. Anticipate blood sugars will improve with current regime.   Should his diet be changed to carb modified/heart healthy?    Gentry Fitz, RN, BA, MHA, CDE Diabetes Coordinator Inpatient Diabetes Program  [phone] (Team Pager) [phone] ([place]) 01/17/2016 7:36 AM

## 2016-01-18 ENCOUNTER — Inpatient Hospital Stay (HOSPITAL_COMMUNITY): Payer: Medicare Other

## 2016-01-18 ENCOUNTER — Inpatient Hospital Stay (HOSPITAL_COMMUNITY): Payer: Medicare Other | Admitting: Physical Therapy

## 2016-01-18 LAB — GLUCOSE, CAPILLARY
GLUCOSE-CAPILLARY: 97 mg/dL (ref 65–99)
GLUCOSE-CAPILLARY: 149 mg/dL — AB (ref 65–99)
Glucose-Capillary: 108 mg/dL — ABNORMAL HIGH (ref 65–99)
Glucose-Capillary: 134 mg/dL — ABNORMAL HIGH (ref 65–99)

## 2016-01-18 LAB — CBC
HEMATOCRIT: 24 % — AB (ref 39.0–52.0)
Hemoglobin: 7.5 g/dL — ABNORMAL LOW (ref 13.0–17.0)
MCH: 29.1 pg (ref 26.0–34.0)
MCHC: 31.3 g/dL (ref 30.0–36.0)
MCV: 93 fL (ref 78.0–100.0)
Platelets: 246 10*3/uL (ref 150–400)
RBC: 2.58 MIL/uL — AB (ref 4.22–5.81)
RDW: 15.3 % (ref 11.5–15.5)
WBC: 10.9 10*3/uL — AB (ref 4.0–10.5)

## 2016-01-18 LAB — PREPARE RBC (CROSSMATCH)

## 2016-01-18 LAB — HEMOGLOBIN AND HEMATOCRIT, BLOOD
HEMATOCRIT: 19.6 % — AB (ref 39.0–52.0)
Hemoglobin: 6.1 g/dL — CL (ref 13.0–17.0)

## 2016-01-18 MED ORDER — SODIUM CHLORIDE 0.9 % IV SOLN
Freq: Once | INTRAVENOUS | Status: AC
  Administered 2016-01-18: 10 mL/h via INTRAVENOUS

## 2016-01-18 NOTE — Evaluation (Signed)
Occupational Therapy Assessment and Plan  Patient Details  Name: Christian [name] MRN: [id number] Date of Birth: 09-10-1940  OT Diagnosis: acute pain and muscle weakness (generalized) Rehab Potential: Rehab Potential (ACUTE ONLY): Good ELOS: 17-21 days   Today's Date: 01/18/2016 OT IndividualTime: 1100-1200 60 Minutes total time.    Problem List:  Patient Active Problem List   Diagnosis Date Noted  . Sacral decubitus ulcer 01/17/2016  . Accident caused by farm tractor 01/15/2016  . Urethral injury 01/15/2016  . Acute blood loss anemia 01/15/2016  . Acute kidney injury ([place]) 01/15/2016  . Multiple pelvic fractures ([place]) 01/10/2016    Past Medical History:  Past Medical History:  Diagnosis Date  . Diabetes mellitus without complication ([place])   . Hyperlipidemia   . Hypertension    Past Surgical History:  Past Surgical History:  Procedure Laterality Date  . ACHILLES TENDON SURGERY Right   . TONSILLECTOMY    . ULNAR NERVE TRANSPOSITION Right     Assessment & Plan Clinical Impression: Patient is a 75 y.o. right handed malewith history of hypertension, diabetes mellitus.Per chart review patient lives with wife. Independent prior to admission. One level home with 5 steps to entry.Presented 01/10/2016 after he was working on a tractor when it slipped into gear and pinned him between a tractor and a pole. No loss of consciousness. CT of chest abdomen and pelvis showed sagittal right sacral alarfracture extending across the right sacroiliac joint and involving the right iliac bone. Bilateral parasymphyseal fractures extending into the pubic symphysis posteriorly. Right inferior pubic ramus fracture with minimal displacement. CT retrograde urethrogram due to some hematuria completed follow-up for urology services showed a large extraperitoneal clot anterior to the bladder. Bedside cystoscopy by urology unsuccessful due to seemingly significant urethral injury near the membrane is urethra  and a super pubic tube was placed with plan for fluoroscopy guided exchange and upsizing in approximately 6 weeks. Underwent transsacral screw 2 for pelvic ring fracture. Touchdown weightbearing right leg to facilitate transfers. Weightbearing as tolerated left leg for transfers only essentially bed to chair 8 weeks. Also noted sacral ulcer 6 x 6 cm in size with Dr. Marla Roe  consulted10/08/2015 and advised air mattress as well as vitamin C and zinc. Bactroban to sacral area daily. Hospital course pain management. Subcutaneous Lovenox for DVT prophylaxis. Acute blood loss anemia 7.3 and monitored. Physical therapy evaluation completed and ongoing with recommendations of physical medicine rehabilitation consult.    Patient transferred to CIR on 01/17/2016 .    Patient currently requires maximum assistance with basic self-care skills secondary to pain, edema at scotum,  muscle weakness and muscle joint tightness.  Prior to hospitalization, patient could complete BADL and iADL independently.  Patient will benefit from skilled intervention to increase independence with basic self-care skills prior to discharge home with care partner.  Anticipate patient will require minimal physical assistance and no further OT follow recommended.  OT - End of Session Activity Tolerance: Tolerates 10 - 20 min activity with multiple rests Endurance Deficit: Yes OT Assessment Rehab Potential (ACUTE ONLY): Good OT Patient demonstrates impairments in the following area(s): Balance;Edema;Endurance;Safety;Pain OT Basic ADL's Functional Problem(s): Bathing;Dressing;Toileting OT Advanced ADL's Functional Problem(s): Simple Meal Preparation OT Transfers Functional Problem(s): Toilet;Tub/Shower OT Plan OT Intensity: Minimum of 1-2 x/day, 45 to 90 minutes OT Frequency: 5 out of 7 days OT Duration/Estimated Length of Stay: 17-21 days OT Treatment/Interventions: Discharge planning;Self Care/advanced ADL  retraining;Patient/family education;Functional mobility training;DME/adaptive equipment instruction;Therapeutic Exercise;UE/LE Strength taining/ROM;Pain management OT Self Feeding Anticipated  Outcome(s): Mod I OT Basic Self-Care Anticipated Outcome(s): Min A OT Toileting Anticipated Outcome(s): Min A OT Bathroom Transfers Anticipated Outcome(s): Min A OT Recommendation Patient destination: Home Follow Up Recommendations: None Equipment Recommended: To be determined   Skilled Therapeutic Intervention OT initial eval completed with intervention provided to address pain management, bed mobility, methods and goals of treatment and initial instruction on use of DME (bed controls).   Pt requested bed level activity due to fatigue with multiple rest breaks during bed mobility re-ed.  Patient reported severe pain during assisted bed mobility from multiple caregivers and requested assist to post instructions over his bed to reduce level of pain when assisted.  Patient able to dictate sequence and instructions were posted as requested.  OT Evaluation Precautions/Restrictions  Precautions Precautions: Fall Restrictions RLE Weight Bearing: Non weight bearing LLE Weight Bearing: Weight bearing as tolerated Other Position/Activity Restrictions: LLE WBAT for transfers only  General Chart Reviewed: Yes Family/Caregiver Present: Yes (Spouse)  Vital Signs Therapy Vitals BP: (!) 145/65  Pain Pain Assessment Pain Assessment: No/denies pain  Home Living/Prior Functioning Home Living Available Help at Discharge: Family, Available 24 hours/day Type of Home: House Home Access: Stairs to enter [employer] of Steps: 4 Entrance Stairs-Rails: Right, Left, Can reach both Home Layout: One level Bathroom Shower/Tub: Gaffer, Door [employer] Toilet: Handicapped height Bathroom Accessibility: Yes  Lives With: Spouse IADL History Homemaking Responsibilities: Yes Meal Prep  Responsibility: Secondary Laundry Responsibility: Secondary Cleaning Responsibility: No Bill Paying/Finance Responsibility: Secondary Shopping Responsibility: Secondary Child Care Responsibility: No Current License: Yes Mode of Transportation: Other (comment) (Pick-up) Education: HS+ 2 years [place] at [employer] Occupation: Retired Type of Occupation: formerly Development worker, community, Administrator, 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, Marine scientist,  Prior Function Level of Independence: Independent with basic ADLs, Independent with gait  Able to Take Stairs?: Yes Driving: Yes Vocation: Retired Leisure: Hobbies-yes (Comment) Comments: Fishing, carpentry, light farmig   ADL ADL ADL Comments: see Functional Assessment  Vision/Perception  Vision- History Baseline Vision/History: Wears glasses Wears Glasses: Reading only Patient Visual Report: No change from baseline Vision- Assessment Vision Assessment?: No apparent visual deficits   Cognition Overall Cognitive Status: Within Functional Limits for tasks assessed Arousal/Alertness: Suspect due to medications Orientation Level: Person;Place;Situation Year: 2017 Month: October Day of Week: Correct Memory: Appears intact Immediate Memory Recall: Sock;Blue;Bed Memory Recall: Sock;Blue;Bed Memory Recall Sock: Without Cue Memory Recall Blue: Without Cue Memory Recall Bed: Without Cue Attention: Sustained Sustained Attention: Appears intact Awareness: Appears intact Problem Solving: Appears intact Safety/Judgment: Appears intact  Sensation Sensation Light Touch: Impaired Detail Light Touch Impaired Details: Impaired LLE Stereognosis: Appears Intact Hot/Cold: Appears Intact Proprioception: Appears Intact Additional Comments: hypersensitive to pain in BLE  Coordination Gross Motor Movements are Fluid and Coordinated: Yes Fine Motor Movements are Fluid and Coordinated: Yes Coordination and Movement Description:  WFL at BUE; mild OA at R shoulder  Motor  Motor Motor: Within Functional Limits Motor - Skilled Clinical Observations: limited by pain   Mobility  Bed Mobility Bed Mobility: Rolling Left;Supine to Sit;Sit to Supine Rolling Left: 2: Max assist Rolling Left Details: Verbal cues for technique;Verbal cues for precautions/safety;Verbal cues for safe use of DME/AE;Tactile cues for weight beaing;Tactile cues for weight shifting;Manual facilitation for weight shifting;Manual facilitation for placement Supine to Sit: 2: Max assist Supine to Sit Details: Tactile cues for weight shifting;Tactile cues for weight beaing;Verbal cues for technique;Verbal cues for precautions/safety;Verbal cues for safe use of DME/AE;Manual facilitation for placement;Manual facilitation for weight bearing;Manual facilitation  for weight shifting Sit to Supine: 1: +2 Total assist Sit to Supine - Details: Tactile cues for placement;Tactile cues for weight beaing;Verbal cues for technique;Verbal cues for precautions/safety;Verbal cues for safe use of DME/AE;Manual facilitation for weight shifting;Manual facilitation for placement;Manual facilitation for weight bearing;Verbal cues for sequencing Transfers Transfers: Not assessed   Trunk/Postural Assessment  Cervical Assessment Cervical Assessment: Within Functional Limits Thoracic Assessment Thoracic Assessment: Within Functional Limits Lumbar Assessment Lumbar Assessment: Within Functional Limits Postural Control Postural Control: Deficits on evaluation (posterior lean in sitting. )   Balance Balance Balance Assessed: Yes Static Sitting Balance Static Sitting - Balance Support: Left upper extremity supported Static Sitting - Level of Assistance: 4: Min assist Dynamic Sitting Balance Dynamic Sitting - Balance Support: Bilateral upper extremity supported Dynamic Sitting - Level of Assistance: 3: Mod assist;2: Max assist Sitting balance - Comments: leans back on UE's  for support  Extremity/Trunk Assessment RUE Assessment RUE Assessment: Within Functional Limits LUE Assessment LUE Assessment: Within Functional Limits   See Function Navigator for Current Functional Status.   Refer to Care Plan for Long Term Goals  Recommendations for other services: None  Discharge Criteria: Patient will be discharged from OT if patient refuses treatment 3 consecutive times without medical reason, if treatment goals not met, if there is a change in medical status, if patient makes no progress towards goals or if patient is discharged from hospital.  The above assessment, treatment plan, treatment alternatives and goals were discussed and mutually agreed upon: by patient   Second session: Time: 1415-1530 Time Calculation (min):75 min  Pain Assessment: 10/10 during activity  Skilled Therapeutic Interventions: ADL-retraining with focus on bed level bathing and dressing, pain management, family education, DME/AE training (bed controls, reacher, leg lifter, LH sponge), and adapted dressing skills.    With setup to provide supplies and AE, max instructional cues, and mod assist to manage both legs while patient performed rolls, left and right, patient completed lower body bathing with max assist to wash scrotum, buttocks and lower legs.   With HOB elevated, pt was able to wash upper body except his back.   Patient required frequent rest breaks and moderate assist throughout session to manage both legs during mobility d/t severe hip/back pain.   Pt educated on need for directing caregivers to proceed at slow pace and to allow patient to initiate roll without caregiver assist but to request total support of right LE during assisted mobility.   Pt was able to rise to sit at EOB with max assist but was unable to tolerate sitting greater than 2 minutes and would not attempt transfer to w/c or BSC d/t severe right hip pain.   Pt recovered to supine in bed at end of session, HOB  elevated for comfort with wife attending to his needs.   See FIM for current functional status  Therapy/Group: Individual Therapy  Naimah Yingst 01/18/2016, 3:47 PM

## 2016-01-18 NOTE — Evaluation (Signed)
Physical Therapy Assessment and Plan  Patient Details  Name: Christian [name] MRN: [id number] Date of Birth: [birth year]  PT Diagnosis: Difficulty walking, Muscle weakness and Pain in pelvis Rehab Potential: Good ELOS: 14-16 days    Today's Date: 01/18/2016 PT Individual Time: 0800-0900 PT Individual Time Calculation (min): 60 min     Problem List: Patient Active Problem List   Diagnosis Date Noted  . Sacral decubitus ulcer 01/17/2016  . Accident caused by farm tractor 01/15/2016  . Urethral injury 01/15/2016  . Acute blood loss anemia 01/15/2016  . Acute kidney injury ([place]) 01/15/2016  . Multiple pelvic fractures ([place]) 01/10/2016    Past Medical History:  Past Medical History:  Diagnosis Date  . Diabetes mellitus without complication ([place])   . Hyperlipidemia   . Hypertension    Past Surgical History:  Past Surgical History:  Procedure Laterality Date  . ACHILLES TENDON SURGERY Right   . TONSILLECTOMY    . ULNAR NERVE TRANSPOSITION Right     Assessment & Plan Clinical Impression: Patient is a o.right handed malewith history of hypertension, diabetes mellitus.Per chart review patient lives with wife. Independent prior to admission. One level home with 5 steps to entry.Presented 01/10/2016 after he was working on a tractor when it slipped into gear and pinned him between a tractor and a pole. No loss of consciousness. CT of chest abdomen and pelvis showed sagittal right sacral alarfracture extending across the right sacroiliac joint and involving the right iliac bone. Bilateral parasymphyseal fractures extending into the pubic symphysis posteriorly. Right inferior pubic ramus fracture with minimal displacement. CT retrograde urethrogram due to some hematuria completed follow-up for urology services showed a large extraperitoneal clot anterior to the bladder. Bedside cystoscopy by urology unsuccessful due to seemingly significant urethral injury near the membrane is urethra and a  super pubic tube was placed with plan for fluoroscopy guided exchange and upsizing in approximately 6 weeks. Underwent transsacral screw 2 for pelvic ring fracture. Touchdown weightbearing right leg to facilitate transfers. Weightbearing as tolerated left leg for transfers only essentially bed to chair 8 weeks. Also noted sacral ulcer 6 x 6 cm in size with Dr. Marla Roe  consulted10/08/2015 and advised air mattress as well as vitamin C and zinc. Bactroban to sacral area daily. Hospital course pain management. Subcutaneous Lovenox for DVT prophylaxis. Acute blood loss anemia 7.3 and monitored. Patient transferred to CIR on 01/17/2016 .   Patient currently requires total with mobility secondary to muscle weakness and muscle joint tightness and decreased sitting balance, decreased standing balance, decreased balance strategies and difficulty maintaining precautions.  Prior to hospitalization, patient was independent  with mobility and lived with Spouse in a House home.  Home access is 4Stairs to enter.  Patient will benefit from skilled PT intervention to maximize safe functional mobility, minimize fall risk and decrease caregiver burden for planned discharge home with 24 hour supervision.  Anticipate patient will benefit from follow up [facility] at discharge.  PT - End of Session Activity Tolerance: Tolerates 10 - 20 min activity with multiple rests Endurance Deficit: Yes PT Assessment Rehab Potential (ACUTE/IP ONLY): Good Barriers to Discharge: Inaccessible home environment PT Patient demonstrates impairments in the following area(s): Behavior;Endurance;Pain;Sensory PT Transfers Functional Problem(s): Bed Mobility;Bed to Chair;Furniture;Car;Floor PT Locomotion Functional Problem(s): Ambulation;Wheelchair Mobility;Stairs PT Plan PT Intensity: Minimum of 1-2 x/day ,45 to 90 minutes PT Frequency: 5 out of 7 days PT Duration Estimated Length of Stay: 14-16 days  PT Treatment/Interventions: Ambulation/gait  training;Balance/vestibular training;Community reintegration;Disease management/prevention;Discharge planning;DME/adaptive equipment  instruction;Functional mobility training;Pain management;Neuromuscular re-education;Patient/family education;Psychosocial support;Skin care/wound management;Splinting/orthotics;Therapeutic Exercise;Therapeutic Activities;Stair training;UE/LE Strength taining/ROM;UE/LE Coordination activities;Visual/perceptual remediation/compensation;Wheelchair propulsion/positioning PT Transfers Anticipated Outcome(s): Supervision with LRAD  PT Locomotion Anticipated Outcome(s): WC level mod I.  PT Recommendation Follow Up Recommendations: Home health PT Patient destination: Home Equipment Recommended: Wheelchair (measurements);Wheelchair cushion (measurements);Rolling walker with 5" wheels;To be determined        Skilled Therapeutic Intervention  Patient received supine in bed and agreeable to PT. Patient cleared for therapy per MD with low hemoglobin. PT instructed patient in PT evaluation and initiated treatment intervention; see below for results. Patient reports lightheadedness and dizziness With supine>sit. Vital assessed in sitting 150/65. Sitting balance x 10 minutes with BUE support. Patient noted to required total +2 assist to prevent sliding off bed and to maintain NWB through the RLE Vitals re-assessed following sit>supine as listed below: 137/59.   Patient left in bed with call bell reach and all needs met.    PT Evaluation Precautions/Restrictions Precautions Precautions: Fall Restrictions RLE Weight Bearing: Non weight bearing LLE Weight Bearing: Weight bearing as tolerated Other Position/Activity Restrictions: LLE WBAT for transfers only General   Vital SignsTherapy Vitals BP: (!) 145/65 Pain Pain Assessment Pain Assessment: No/denies pain Home Living/Prior Functioning Home Living Available Help at Discharge: Family;Available 24 hours/day Type of  Home: House Home Access: Stairs to enter Entrance Stairs-Rails: Right;Left;Can reach both Home Layout: One level Bathroom Shower/Tub: Walk-in shower;Door [employer] Toilet: Handicapped height Bathroom Accessibility: Yes  Lives With: Spouse Prior Function Level of Independence: Independent with basic ADLs;Independent with gait  Able to Take Stairs?: Yes Driving: Yes Vocation: Retired Leisure: Hobbies-yes (Comment) Comments: Fishing, carpentry, light farmig  Vision/Perception     Cognition Overall Cognitive Status: Within Functional Limits for tasks assessed Arousal/Alertness: Suspect due to medications Attention: Sustained Sustained Attention: Appears intact Memory: Appears intact Awareness: Appears intact Safety/Judgment: Appears intact Sensation Sensation Light Touch: Impaired Detail Light Touch Impaired Details: Impaired LLE Stereognosis: Appears Intact Hot/Cold: Appears Intact Proprioception: Appears Intact Additional Comments: hypersensitive to pain in BLE  Coordination Gross Motor Movements are Fluid and Coordinated: Yes Fine Motor Movements are Fluid and Coordinated: Yes Coordination and Movement Description: WFL at BUE; mild OA at R shoulder Motor  Motor Motor: Within Functional Limits Motor - Skilled Clinical Observations: limited by pain   Mobility Bed Mobility Bed Mobility: Rolling Left;Supine to Sit;Sit to Supine Rolling Left: 2: Max assist Rolling Left Details: Verbal cues for technique;Verbal cues for precautions/safety;Verbal cues for safe use of DME/AE;Tactile cues for weight beaing;Tactile cues for weight shifting;Manual facilitation for weight shifting;Manual facilitation for placement Supine to Sit: 2: Max assist Supine to Sit Details: Tactile cues for weight shifting;Tactile cues for weight beaing;Verbal cues for technique;Verbal cues for precautions/safety;Verbal cues for safe use of DME/AE;Manual facilitation for placement;Manual facilitation for  weight bearing;Manual facilitation for weight shifting Sit to Supine: 1: +2 Total assist Sit to Supine - Details: Tactile cues for placement;Tactile cues for weight beaing;Verbal cues for technique;Verbal cues for precautions/safety;Verbal cues for safe use of DME/AE;Manual facilitation for weight shifting;Manual facilitation for placement;Manual facilitation for weight bearing;Verbal cues for sequencing Transfers Transfers: No (pain and dizziness decreased safety 2/2 low hemoglobin) Locomotion  Ambulation Ambulation: No (limited by pain ) Gait Gait: No Stairs / Additional Locomotion Stairs: No Wheelchair Mobility Wheelchair Mobility: No (unable to assess due to pain )  Trunk/Postural Assessment  Cervical Assessment Cervical Assessment: Within Functional Limits Thoracic Assessment Thoracic Assessment: Within Functional Limits Lumbar Assessment Lumbar Assessment: Within Functional Limits Postural Control Postural  Control: Deficits on evaluation (posterior lean in sitting. )  Balance Balance Balance Assessed: Yes Static Sitting Balance Static Sitting - Balance Support: Left upper extremity supported Static Sitting - Level of Assistance: 4: Min assist Dynamic Sitting Balance Dynamic Sitting - Balance Support: Bilateral upper extremity supported Dynamic Sitting - Level of Assistance: 3: Mod assist;2: Max assist Sitting balance - Comments: leans back on UE's for support Extremity Assessment  RUE Assessment RUE Assessment: Within Functional Limits LUE Assessment LUE Assessment: Within Functional Limits RLE Assessment RLE Assessment: Exceptions to [facility] RLE AROM (degrees) RLE Overall AROM Comments: decreased 50% by pain.  RLE Strength RLE Overall Strength Comments: grossly 2-/5 throughout LE due to pain.  LLE Assessment LLE Assessment: Exceptions to [facility] (mild increase in pain at end range ROM and with 4/5 MMT throughout LE)   See Function Navigator for Current Functional  Status.   Refer to Care Plan for Long Term Goals  Recommendations for other services: None  Discharge Criteria: Patient will be discharged from PT if patient refuses treatment 3 consecutive times without medical reason, if treatment goals not met, if there is a change in medical status, if patient makes no progress towards goals or if patient is discharged from hospital.  The above assessment, treatment plan, treatment alternatives and goals were discussed and mutually agreed upon: by patient  Lorie Phenix 01/18/2016, 12:56 PM

## 2016-01-18 NOTE — Progress Notes (Signed)
Subjective: Patient denies complaints or pain. He is in the room with his wife. Getting ready for his first physical therapy.  Objective:BP (!) 145/65   Pulse 89   Temp 99 F (37.2 C) (Oral)   Resp 17   Ht 6\' 2"  (1.88 m)   Wt 224 lb (101.6 kg)   SpO2 90%   BMI 28.76 kg/m   Elderly male in no acute distress. Mildly pale. HEENT exam: Atraumatic, normocephalic, neck supple. Chest clear to auscultation Cardiac exam S1 and S2 are regular Abdominal exam active bowel sounds, soft Extremities without edema. GU: Large scrotal bruising.  Assessment and plan  Medical Problem List and Plan: 1.  Multi pelvic fractures status post sacroiliac screw fixation secondary to trauma.              -Touchdown weightbearing right leg to facilitate transfers.              -Weightbearing as tolerated left leg for transfers only 8 weeks             2.  DVT Prophylaxis/Anticoagulation: SQ Lovenox.Check vascular study 3. Pain Management: Controlled. 4. Acute blood loss anemia.Fergon 324 mg bid. Follow-up CBC Hemoglobin continues to decrease. We'll transfuse 1 unit of packed red blood cells. 5. Neuropsych: This patient is capable of making decisions on his own behalf. 6. Skin/Wound Care/Sacral wound: Mattress replacement.Bactroban daily.WOC consulted. Follow-up plastic surgery Dr. Marla Roe 7. Fluids/Electrolytes/Nutrition: Basic Metabolic Panel:    Component Value Date/Time   NA 139 01/14/2016 0426   K 3.7 01/14/2016 0426   CL 112 (H) 01/14/2016 0426   CO2 24 01/14/2016 0426   BUN 12 01/14/2016 0426   CREATININE 1.05 01/17/2016 1845   GLUCOSE 192 (H) 01/14/2016 0426   CALCIUM 7.9 (L) 01/14/2016 0426    8. Urethral trauma. Status post suprapubic catheter. Follow-up with changes as directed. Urine clear/yellow at present [place] follow-up. 9. Diabetes mellitus with peripheral neuropathy. Hemoglobin A1c 7.9. Levemir 15 units daily at bedtime, Glucophage 1000 mg twice a day, Glucotrol  10 mg before breakfast and 5 mg before supper.            CBG (last 3)   Recent Labs  01/17/16 1727 01/17/16 2038 01/18/16 0645  GLUCAP 155* 157* 97   Fair control. Continue current medications.  10. Hypertension. [id number] 11. Hyperlipidemia. Lipitor 12. Constipation. Laxative assist

## 2016-01-19 ENCOUNTER — Inpatient Hospital Stay (HOSPITAL_COMMUNITY): Payer: Medicare Other | Admitting: Physical Therapy

## 2016-01-19 ENCOUNTER — Inpatient Hospital Stay (HOSPITAL_COMMUNITY): Payer: Medicare Other

## 2016-01-19 DIAGNOSIS — L89159 Pressure ulcer of sacral region, unspecified stage: Secondary | ICD-10-CM

## 2016-01-19 DIAGNOSIS — D62 Acute posthemorrhagic anemia: Secondary | ICD-10-CM

## 2016-01-19 DIAGNOSIS — W309XXS Contact with unspecified agricultural machinery, sequela: Secondary | ICD-10-CM

## 2016-01-19 DIAGNOSIS — S3282XS Multiple fractures of pelvis without disruption of pelvic ring, sequela: Secondary | ICD-10-CM

## 2016-01-19 DIAGNOSIS — M79609 Pain in unspecified limb: Secondary | ICD-10-CM

## 2016-01-19 LAB — GLUCOSE, CAPILLARY
GLUCOSE-CAPILLARY: 157 mg/dL — AB (ref 65–99)
GLUCOSE-CAPILLARY: 144 mg/dL — AB (ref 65–99)
Glucose-Capillary: 162 mg/dL — ABNORMAL HIGH (ref 65–99)
Glucose-Capillary: 96 mg/dL (ref 65–99)

## 2016-01-19 MED ORDER — LINACLOTIDE 145 MCG PO CAPS
145.0000 ug | ORAL_CAPSULE | Freq: Every day | ORAL | Status: DC
  Administered 2016-01-20 – 2016-01-21 (×2): 145 ug via ORAL
  Filled 2016-01-19 (×2): qty 1

## 2016-01-19 NOTE — Progress Notes (Signed)
Christian [name] is a 75 y.o. male 23-Apr-1940 [id number]  Subjective: No new complaints. No new problems. Slept well. Feeling better.  Objective: Vital signs in last 24 hours: Temp:  [96.9 F (36.1 C)-98.7 F (37.1 C)] 98.7 F (37.1 C) (10/08 1350) Pulse Rate:  [87-96] 96 (10/08 1350) Resp:  [16-18] 18 (10/08 1350) BP: (132-162)/(56-86) 132/56 (10/08 1350) SpO2:  [98 %] 98 % (10/08 1350) Weight change:  Last BM Date: 01/15/16  Intake/Output from previous day: 10/07 0701 - 10/08 0700 In: 1105 [P.O.:720; I.V.:50; Blood:335] Out: 1900 [Urine:1900] Last cbgs: CBG (last 3)   Recent Labs  01/18/16 2039 01/19/16 0634 01/19/16 1129  GLUCAP 134* 157* 162*     Physical Exam General: No apparent distress   HEENT: not dry Lungs: Normal effort. Lungs clear to auscultation, no crackles or wheezes. Cardiovascular: Regular rate and rhythm, no edema Abdomen: S/NT/ND; BS(+) Musculoskeletal:  unchanged Neurological: No new neurological deficits Wounds: N/A    Skin: clear  Aging changes Mental state: Alert, oriented, cooperative    Lab Results: BMET    Component Value Date/Time   NA 139 01/14/2016 0426   K 3.7 01/14/2016 0426   CL 112 (H) 01/14/2016 0426   CO2 24 01/14/2016 0426   GLUCOSE 192 (H) 01/14/2016 0426   BUN 12 01/14/2016 0426   CREATININE 1.05 01/17/2016 1845   CALCIUM 7.9 (L) 01/14/2016 0426   GFRNONAA >60 01/17/2016 1845   GFRAA >60 01/17/2016 1845   CBC    Component Value Date/Time   WBC 10.9 (H) 01/18/2016 2059   RBC 2.58 (L) 01/18/2016 2059   HGB 7.5 (L) 01/18/2016 2059   HCT 24.0 (L) 01/18/2016 2059   PLT 246 01/18/2016 2059   MCV 93.0 01/18/2016 2059   MCH 29.1 01/18/2016 2059   MCHC 31.3 01/18/2016 2059   RDW 15.3 01/18/2016 2059   LYMPHSABS 2.0 01/15/2016 0551   MONOABS 1.0 01/15/2016 0551   EOSABS 0.2 01/15/2016 0551   BASOSABS 0.0 01/15/2016 0551    Studies/Results: No results found.  Medications: I have reviewed the patient's current  medications.  Assessment/Plan:   1. Multi pelvic fracturesstatus post sacroiliac screw fixationsecondary to trauma.  -Touchdown weightbearing right leg to facilitate transfers.  -Weightbearing as tolerated left leg for transfers only 8 weeks  2. DVT Prophylaxis/Anticoagulation: SQ Lovenox.Check vascular study 3. Pain Management: Controlled. 4. Acute blood loss anemia.Fergon 324 mg bid.Follow-up CBC Hemoglobin continues to decrease. We've transfused 1 unit of packed red blood cells. 5. Neuropsych: This patient iscapable of making decisions on hisown behalf. 6. Skin/Wound Care/Sacral wound: Mattress replacement.Bactroban daily.WOC consulted. Follow-up plastic surgery Dr. Marla Roe 7. Fluids/Electrolytes/Nutrition: Basic Metabolic Panel: Labs(Brief)          Component Value Date/Time   NA 139 01/14/2016 0426   K 3.7 01/14/2016 0426   CL 112 (H) 01/14/2016 0426   CO2 24 01/14/2016 0426   BUN 12 01/14/2016 0426   CREATININE 1.05 01/17/2016 1845   GLUCOSE 192 (H) 01/14/2016 0426   CALCIUM 7.9 (L) 01/14/2016 0426      8.Urethral trauma. Status post suprapubic catheter. Follow-up with changes as directed. Urine clear/yellow at present [facility] follow-up. 9.Diabetes mellitus with peripheral neuropathy. Hemoglobin A1c 7.9. Levemir 15 units daily at bedtime, Glucophage 1000 mg twice a day, Glucotrol 10 mg before breakfast and 5 mg before supper.  CBG (last 3)   RecentLabs(last2labs)   Recent Labs  01/17/16 1727 01/17/16 2038 01/18/16 0645  GLUCAP 155* 157* 97     Fair  control. Continue current medications.  10.Hypertension. [id number] 11.Hyperlipidemia. Lipitor 12.Constipation. Laxative assist and Linzess    Length of stay, days: 2  Walker Kehr , MD 01/19/2016, 4:01 PM

## 2016-01-19 NOTE — Progress Notes (Signed)
VASCULAR LAB PRELIMINARY  PRELIMINARY  PRELIMINARY  PRELIMINARY  Bilateral lower extremity venous duplex completed.    Preliminary report:  There is no DVT or SVT noted in the bilateral lower extremities.   Prentice Sackrider, RVT 01/19/2016, 5:04 PM

## 2016-01-19 NOTE — Progress Notes (Signed)
Physical Therapy Session Note  Patient Details  Name: Christian [name] MRN: [id number] Date of Birth: January 13, 1941  Today's Date: 01/19/2016 PT Individual Time: [id number] PT Individual Time Calculation (min): 26 min    Short Term Goals: Week 1:  PT Short Term Goal 1 (Week 1): patient will perform bed mobility with mod A from PT.  PT Short Term Goal 2 (Week 1): Patient will perform stand pivot/squat pivot with mod A.  PT Short Term Goal 3 (Week 1): Patient will performed WC mobility for 115ft with Supervision.  PT Short Term Goal 4 (Week 1): Patient will be able to participate in OOB activity for up to 2 hours.  Skilled Therapeutic Interventions/Progress Updates:    Pt received in bed with multiple family members present, who exited upon PT arrival. Pt agreeable to tx noting minimal pain at rest but significant pain with changes in [facility] position/movement. Wife remained present for session. Educated pt on PT goals of transfers & w/c mobility, weight bearing status of pt's BLE, and main focuses of PT while in CIR. Pt's wife reports their son-in-law plans to install a ramp at their house. Provided pt & wife with home measurement sheet. Discussed potential equipment needs upon d/c. Educated pt on importance of sitting upright in bed to prevent potential orthostatic hypotension once pt does start sitting up on EOB/in w/c. Educated pt on BLE ankle pumps and benefits of exercise (promoting circulation) with pt return demonstrating exercise; encouraged pt to perform ankle pumps at least 20x/hour or more throughout day with pt verbalizing understanding. Discussed recovery following injury with pt & wife.  At end of session pt left in bed with all needs within reach.   Therapy Documentation Precautions:  Precautions Precautions: Fall Restrictions Weight Bearing Restrictions: Yes RLE Weight Bearing: Non weight bearing LLE Weight Bearing: Weight bearing as tolerated Other Position/Activity Restrictions: LLE  WBAT for transfers only   See Function Navigator for Current Functional Status.   Therapy/Group: Individual Therapy  Waunita Schooner 01/19/2016, 5:00 PM

## 2016-01-20 ENCOUNTER — Inpatient Hospital Stay (HOSPITAL_COMMUNITY): Payer: Medicare Other | Admitting: Physical Therapy

## 2016-01-20 ENCOUNTER — Inpatient Hospital Stay (HOSPITAL_COMMUNITY): Payer: Medicare Other | Admitting: Occupational Therapy

## 2016-01-20 DIAGNOSIS — L89152 Pressure ulcer of sacral region, stage 2: Secondary | ICD-10-CM

## 2016-01-20 LAB — CBC WITH DIFFERENTIAL/PLATELET
BASOS ABS: 0 10*3/uL (ref 0.0–0.1)
Basophils Relative: 0 %
EOS PCT: 3 %
Eosinophils Absolute: 0.3 10*3/uL (ref 0.0–0.7)
HEMATOCRIT: 24.8 % — AB (ref 39.0–52.0)
HEMOGLOBIN: 7.7 g/dL — AB (ref 13.0–17.0)
LYMPHS ABS: 2.2 10*3/uL (ref 0.7–4.0)
LYMPHS PCT: 22 %
MCH: 29.1 pg (ref 26.0–34.0)
MCHC: 31 g/dL (ref 30.0–36.0)
MCV: 93.6 fL (ref 78.0–100.0)
Monocytes Absolute: 0.9 10*3/uL (ref 0.1–1.0)
Monocytes Relative: 9 %
NEUTROS ABS: 6.7 10*3/uL (ref 1.7–7.7)
NEUTROS PCT: 66 %
Platelets: 256 10*3/uL (ref 150–400)
RBC: 2.65 MIL/uL — AB (ref 4.22–5.81)
RDW: 15.4 % (ref 11.5–15.5)
WBC: 10.1 10*3/uL (ref 4.0–10.5)

## 2016-01-20 LAB — COMPREHENSIVE METABOLIC PANEL
ALK PHOS: 68 U/L (ref 38–126)
ALT: 19 U/L (ref 17–63)
AST: 27 U/L (ref 15–41)
Albumin: 2.7 g/dL — ABNORMAL LOW (ref 3.5–5.0)
Anion gap: 9 (ref 5–15)
BILIRUBIN TOTAL: 1.8 mg/dL — AB (ref 0.3–1.2)
BUN: 19 mg/dL (ref 6–20)
CALCIUM: 8.7 mg/dL — AB (ref 8.9–10.3)
CO2: 29 mmol/L (ref 22–32)
CREATININE: 0.97 mg/dL (ref 0.61–1.24)
Chloride: 101 mmol/L (ref 101–111)
Glucose, Bld: 153 mg/dL — ABNORMAL HIGH (ref 65–99)
Potassium: 4.8 mmol/L (ref 3.5–5.1)
Sodium: 139 mmol/L (ref 135–145)
Total Protein: 5.7 g/dL — ABNORMAL LOW (ref 6.5–8.1)

## 2016-01-20 LAB — TYPE AND SCREEN
ABO/RH(D): O POS
Antibody Screen: NEGATIVE
UNIT DIVISION: 0
UNIT DIVISION: 0

## 2016-01-20 LAB — GLUCOSE, CAPILLARY
GLUCOSE-CAPILLARY: 158 mg/dL — AB (ref 65–99)
GLUCOSE-CAPILLARY: 135 mg/dL — AB (ref 65–99)
Glucose-Capillary: 147 mg/dL — ABNORMAL HIGH (ref 65–99)
Glucose-Capillary: 158 mg/dL — ABNORMAL HIGH (ref 65–99)

## 2016-01-20 MED ORDER — COLLAGENASE 250 UNIT/GM EX OINT
TOPICAL_OINTMENT | Freq: Every day | CUTANEOUS | Status: DC
  Administered 2016-01-21 – 2016-01-29 (×8): via TOPICAL
  Filled 2016-01-20 (×2): qty 30

## 2016-01-20 MED ORDER — HYDROCODONE-ACETAMINOPHEN 5-325 MG PO TABS
1.0000 | ORAL_TABLET | Freq: Four times a day (QID) | ORAL | Status: DC | PRN
  Administered 2016-01-20 – 2016-01-23 (×7): 1 via ORAL
  Filled 2016-01-20 (×7): qty 1

## 2016-01-20 MED ORDER — FENTANYL 12 MCG/HR TD PT72
12.5000 ug | MEDICATED_PATCH | TRANSDERMAL | Status: DC
  Administered 2016-01-20: 12.5 ug via TRANSDERMAL
  Filled 2016-01-20: qty 1

## 2016-01-20 NOTE — IPOC Note (Signed)
Overall Plan of Care [facility]) Patient Details Name: Christian [name] MRN: [id number] DOB: Jun 11, 1940  Admitting Diagnosis: Pelvis FX  Hospital Problems: Principal Problem:   Multiple pelvic fractures ([place]) Active Problems:   Accident caused by farm tractor   Acute blood loss anemia   Sacral decubitus ulcer     Functional Problem List: Nursing Bladder, Bowel, Endurance, Medication Management, Safety, Skin Integrity  PT Behavior, Endurance, Pain, Sensory  OT Balance, Edema, Endurance, Safety, Pain  SLP    TR         Basic ADL's: OT Bathing, Dressing, Toileting     Advanced  ADL's: OT Simple Meal Preparation     Transfers: PT Bed Mobility, Bed to Chair, Furniture, Musician, Floor  OT Toilet, Metallurgist: PT Ambulation, Emergency planning/management officer, Stairs     Additional Impairments: OT    SLP        TR      Anticipated Outcomes Item Anticipated Outcome  Self Feeding Mod I  Swallowing      Basic self-care  Min A  Toileting  Min A   Bathroom Transfers Min A  Bowel/Bladder  manage bowel and bladder  Transfers  Supervision with LRAD   Locomotion  WC level mod I.   Communication     Cognition     Pain  Pain equal to or < 4 with prn medication  Safety/Judgment  Maintain safety with cues/reminders of precautions   Therapy Plan: PT Intensity: Minimum of 1-2 x/day ,45 to 90 minutes PT Frequency: 5 out of 7 days PT Duration Estimated Length of Stay: 14-16 days  OT Intensity: Minimum of 1-2 x/day, 45 to 90 minutes OT Frequency: 5 out of 7 days OT Duration/Estimated Length of Stay: 17-21 days         Team Interventions: Nursing Interventions Patient/Family Education, Discharge Planning, Skin Care/Wound Management, Pain Management, Bowel Management, Bladder Management, Medication Management, Psychosocial Support  PT interventions Ambulation/gait training, Training and development officer, Community reintegration, Disease management/prevention, Discharge  planning, DME/adaptive equipment instruction, Functional mobility training, Pain management, Neuromuscular re-education, Patient/family education, Psychosocial support, Skin care/wound management, Splinting/orthotics, Therapeutic Exercise, Therapeutic Activities, Stair training, UE/LE Strength taining/ROM, UE/LE Coordination activities, Visual/perceptual remediation/compensation, Wheelchair propulsion/positioning  OT Interventions Discharge planning, Self Care/advanced ADL retraining, Patient/family education, Functional mobility training, DME/adaptive equipment instruction, Therapeutic Exercise, UE/LE Strength taining/ROM, Pain management  SLP Interventions    TR Interventions    SW/CM Interventions Discharge Planning, Psychosocial Support, Patient/Family Education    Team Discharge Planning: Destination: PT-Home ,OT- Home , SLP-  Projected Follow-up: PT-Home health PT, OT-  None, SLP-  Projected Equipment Needs: PT-Wheelchair (measurements), Wheelchair cushion (measurements), Rolling walker with 5" wheels, To be determined, OT- To be determined, SLP-  Equipment Details: PT- , OT-  Patient/family involved in discharge planning: PT- Patient, Family member/caregiver,  OT-Patient, Family member/caregiver, SLP-   MD ELOS: 15-17 days Medical Rehab Prognosis:  Excellent Assessment: The patient has been admitted for CIR therapies with the diagnosis of pelvic/multi trauma. The team will be addressing functional mobility, strength, stamina, balance, safety, adaptive techniques and equipment, self-care, bowel and bladder mgt, patient and caregiver education, pain mgt, ortho precautions, skin care, family training, w/c use. Goals have been set at min assist for basic self-care and ADL's and supervision to mod I at w/c level.    [name], MD, FAAPMR      See Team Conference Notes for weekly updates to the plan of care

## 2016-01-20 NOTE — Progress Notes (Addendum)
Physical Therapy Session Note  Patient Details  Name: Christian [name] MRN: [id number] Date of Birth: 05/11/1940  Today's Date: 01/20/2016 PT Individual Time: 1500-1630 PT Individual Time Calculation (min): 90 min    Short Term Goals: Week 1:  PT Short Term Goal 1 (Week 1): patient will perform bed mobility with mod A from PT.  PT Short Term Goal 2 (Week 1): Patient will perform stand pivot/squat pivot with mod A.  PT Short Term Goal 3 (Week 1): Patient will performed WC mobility for 138f with Supervision.  PT Short Term Goal 4 (Week 1): Patient will be able to participate in OOB activity for up to 2 hours.  Skilled Therapeutic Interventions/Progress Updates:    Pt resting in bed upon arrival, states "well I just finished working out so I'm good for the day!"  Agreeable to therapy with encouragement.  PT provided education throughout session regarding activity tolerance, pressure relief, and pacing for pain reduction.  PT provided pt with 20x16 reclining w/c for improved out of bed tolerance.  Supine>sit with max assist for RLE management and for pt to pull up to sitting/scoot to EOB using therapist's hand.  Pt able to tolerate sitting EOB x10 minutes with weight shifted off R hip and onto L elbow for comfort.  Pt reports the room is "swimming like a fish" with upright sitting but symptoms decreased with time, BP 168/70.  Lateral scoot transfer from EOB>w/c with overall max assist for light lifting/lowering assist with pt pushing through therapist's shoulder to help lift and scoot.  Pt able to position self in w/c with BUEs to lift hips back in chair and pt positioned in reclined position with RLE supported for comfort.  Pt able to tolerate sitting in this manner x15 minutes.  Squat/pivot back to bed with max assist for balance, pivot and to adjust RLE during transfer.  Sit>supine with mod assist for LEs and pt able to use LLE to scoot self up in bed.  Pt unable to maintain NWB on RLE throughout  transfers but able to maintain PWB with cues.  Allowed pt to self direct care during transfers and allowed pt to take his time for pain control.  Rolling L and R with mod assist for total assist peri-care as pt had episode of incontinence when straining to transfer.  Pt positioned to comfort in supine at end of session with call bell in reach and needs met.   Therapy Documentation Precautions:  Precautions Precautions: Fall Restrictions Weight Bearing Restrictions: Yes RLE Weight Bearing: Non weight bearing LLE Weight Bearing: Weight bearing as tolerated Other Position/Activity Restrictions: LLE WBAT for transfers only   See Function Navigator for Current Functional Status.   Therapy/Group: Individual Therapy  CEarnest ConroyPenven-Crew 01/20/2016, 4:43 PM

## 2016-01-20 NOTE — Progress Notes (Signed)
Patient refuses to elevate HOB takes po meds lying flat despite aspiration precaution teaching provided. Patient also refuses to reposition remains in supine position. Teaching provided re further skin breakdown to sacral area. Patient states it causes too much pain to Rt. Hip when attempting to elevate HOB.

## 2016-01-20 NOTE — Progress Notes (Signed)
Patient information reviewed and entered into eRehab system by Jaslyne Beeck, RN, CRRN, PPS Coordinator.  Information including medical coding and functional independence measure will be reviewed and updated through discharge.     Per nursing patient was given "Data Collection Information Summary for Patients in Inpatient Rehabilitation Facilities with attached "Privacy Act Statement-Health Care Records" upon admission.  

## 2016-01-20 NOTE — Progress Notes (Signed)
Occupational Therapy Session Note  Patient Details  Name: Christian [name] MRN: [id number] Date of Birth: Apr 26, 1940  Today's Date: 01/20/2016 OT Individual Time: 1000-1110 OT Individual Time Calculation (min): 70 min     Short Term Goals:Week 1:  OT Short Term Goal 1 (Week 1): Patient will complete upper body bathind and dressing at sink with min assist at w/c level. OT Short Term Goal 2 (Week 1): Patient will complete SPT from w/c to toilet with mod assist OT Short Term Goal 3 (Week 1): Pt will perform bed mobility using DME/AE, prn, with mod assist to manage BLE OT Short Term Goal 4 (Week 1): Patient will don pants with max assist to stand supported to pull up pants. OT Short Term Goal 5 (Week 1): Patient will complete lower body bathing at bed level with mod assist to wash buttocks and lower legs using AE, prn.  Skilled Therapeutic Interventions/Progress Updates:    Pt seen to facilitate functional mobility and pain tolerance during self care tasks. Pt stated he had a major accident last night and was cleansed but did want to bath again. Pt is able to wash UB and thighs with set up to position legs. Pt will not tolerate a full roll onto his side due to severe pelvic pain.  It was difficult to fully get to his back and bottom. Pt can only tolerate turning 30% of the way.  Donned shorts with 2 helpers and then pt engaged in UE exercises using theraband focusing on shoulders, upper back, biceps, triceps.  Pt can not tolerate his bed elevated. Asked pt to put bed as high as he could tolerate which was 37*. Pt maintained the bed position for 20 min while doing the there ex and then needed to lower it back to 20*.  For LLE, pt worked on pushing foot into bed to activate glutes and actively using adductors to move leg inward. R Leg with isometric adduction as pt could not tolerate having leg moved into neutral. Pt fatigued and asking to rest. Pt in bed with all needs met.  Therapy  Documentation Precautions:  Precautions Precautions: Fall Restrictions Weight Bearing Restrictions: Yes RLE Weight Bearing: Non weight bearing LLE Weight Bearing: Weight bearing as tolerated Other Position/Activity Restrictions: LLE WBAT for transfers only  Pain: Pain Assessment Pain Assessment: 0-10 Pain Score: 8  Pain Type: Acute pain Pain Location: Hip Pain Orientation: Right Pain Descriptors / Indicators: Aching;Discomfort Pain Onset: On-going Pain Intervention(s):  (premedicated) ADL: ADL ADL Comments: see Functional Assessment  See Function Navigator for Current Functional Status.   Therapy/Group: Individual Therapy  Chrishaun Sasso 01/20/2016, 10:53 AM

## 2016-01-20 NOTE — Progress Notes (Signed)
Occupational Therapy Session Note  Patient Details  Name: Christian [name] MRN: [id number] Date of Birth: 1940/12/31  Today's Date: 01/20/2016 OT Individual Time: 1416-1500 OT Individual Time Calculation (min): 44 min     Short Term Goals: Week 1:  OT Short Term Goal 1 (Week 1): Patient will complete upper body bathind and dressing at sink with min assist at w/c level. OT Short Term Goal 2 (Week 1): Patient will complete SPT from w/c to toilet with mod assist OT Short Term Goal 3 (Week 1): Pt will perform bed mobility using DME/AE, prn, with mod assist to manage BLE OT Short Term Goal 4 (Week 1): Patient will don pants with max assist to stand supported to pull up pants. OT Short Term Goal 5 (Week 1): Patient will complete lower body bathing at bed level with mod assist to wash buttocks and lower legs using AE, prn.  Skilled Therapeutic Interventions/Progress Updates:   Pt was seen for skilled OT session focusing on upper extremity exercises due to pt report of having a large amount of pain. Pt was agreeable to complete bed exercises with 3# dumbbell with HOB raised to 39-40 degrees initially and then lowered due to pt reporting increased pain level. Pt was educated on proper technique of exercises throughout. Nursing notified regarding pts pain. Pt was left in bed with wife present and all needs within reach at end of tx.   Therapy Documentation Precautions:  Precautions Precautions: Fall Restrictions Weight Bearing Restrictions: Yes RLE Weight Bearing: Non weight bearing LLE Weight Bearing: Weight bearing as tolerated Other Position/Activity Restrictions: LLE WBAT for transfers only General:   Vital Signs: Therapy Vitals Temp: 98.2 F (36.8 C) Temp Source: Oral Pulse Rate: 85 Resp: 16 BP: (!) 145/64 Patient Position (if appropriate): Lying Oxygen Therapy SpO2: 97 % O2 Device: Not Delivered Pain: Firmness adjusted on air mattress and pt was provided adequate rest breaks  throughout tx Pain Assessment Pain Score: 7  Pain Type: Acute pain Pain Location: Hip Pain Orientation: Right;Left Pain Descriptors / Indicators: Aching;Discomfort Patients Stated Pain Goal: 2 Pain Intervention(s): Medication (See eMAR) ADL: ADL ADL Comments: see Functional Assessment Exercises: Bilateral UE therapeutic exercise with 3# dumbbell 10-15 reps 2 sets: shoulder flexion, bicep curls, body cross curls, shoulder abduction, tricep punches  :    See Function Navigator for Current Functional Status.   Therapy/Group: Individual Therapy  Ansleigh Safer A Jheri Mitter 01/20/2016, 4:33 PM

## 2016-01-20 NOTE — Progress Notes (Signed)
Social Work  Social Work Assessment and Plan  Patient Details  Name: Christian [name] MRN: [id number] Date of Birth: 1940-12-26  Today's Date: 01/20/2016  Problem List:  Patient Active Problem List   Diagnosis Date Noted  . Sacral decubitus ulcer 01/17/2016  . Accident caused by farm tractor 01/15/2016  . Urethral injury 01/15/2016  . Acute blood loss anemia 01/15/2016  . Acute kidney injury ([place]) 01/15/2016  . Multiple pelvic fractures ([place]) 01/10/2016   Past Medical History:  Past Medical History:  Diagnosis Date  . Diabetes mellitus without complication ([place])   . Hyperlipidemia   . Hypertension    Past Surgical History:  Past Surgical History:  Procedure Laterality Date  . ACHILLES TENDON SURGERY Right   . TONSILLECTOMY    . ULNAR NERVE TRANSPOSITION Right    Social History:  reports that he has never smoked. He has never used smokeless tobacco. He reports that he does not drink alcohol. His drug history is not on file.  Family / Support Systems Marital Status: Married How Long?: 2 yrs (2nd marriage for both) Patient Roles: Spouse, Parent Spouse/Significant Other: wife, Dasheem Braff @ [phone] Children: They each have adult children from prior marriages:  Pt. has 2 son and 2 dtrs mostly living in surrounding areas.  Daughter, Collene Mares [facility]) @ (C) [phone], Anticipated Caregiver: Spouse Ability/Limitations of Caregiver: physical Min assist  Caregiver Availability: 24/7 Family Dynamics: Wife very encouraging to patient and laughs easily with him about how talkative he is when taking pain meds.  They report good relationship with all the children.  Social History Preferred language: English Religion:  Cultural Background: NA Education: HS Read: Yes Write: Yes Employment Status: Retired Freight forwarder Issues: None Guardian/Conservator: None - per MD, pt is capable of making his own decisions.   Abuse/Neglect Physical Abuse:  Denies Verbal Abuse: Denies Sexual Abuse: Denies Exploitation of patient/patient's resources: Denies Self-Neglect: Denies  Emotional Status Pt's affect, behavior adn adjustment status: As noted, pt very talkative which wife attributes to pain meds.  Good sense of humor and talks openly about his strong faith and feels that "God had his hand on my shoulder that day."  He describes the incident very calmly and denies any post traumatic issues.  Denies any significant emotional distress, however, will monitor and refer for neuropsychology as indicated. Recent Psychosocial Issues: None Pyschiatric History: None Substance Abuse History: None  Patient / Family Perceptions, Expectations & Goals Pt/Family understanding of illness & functional limitations: Pt and wife with basic understanding of his multiple injures.  Aware catheter likely to stay in place for some time.  Good understanding of his WB restrictions and functional limitations. Premorbid pt/family roles/activities: Pt was very active and often outdoors working on his land. Anticipated changes in roles/activities/participation: Pt will likely require some degree of phsycial support and wife prepared to assume primary caregiver duties. Pt/family expectations/goals: "I just hope this pain can get under control.  I hope I can get a lot stronger than I am now."  [employer]: None Premorbid Home Care/DME Agencies: None Transportation available at discharge: yes Resource referrals recommended: Neuropsychology  Discharge Planning Living Arrangements: Spouse/significant other Support Systems: Spouse/significant other, Children, Other relatives, Friends/neighbors Type of Residence: Private residence Insurance Resources: [employer], Multimedia programmer (specify) Nurse, mental health) Financial Resources: [place] Referred: No Living Expenses: Own Money Management: Patient Does the patient have any problems  obtaining your medications?: No Home Management: pt and wife share responsibilities Patient/Family Preliminary Plans:  Pt to d/c home with wife who can provide 24/7 min assist Barriers to Discharge:  (son-in-law plans to build ramp) Social Work Anticipated Follow Up Needs: HH/OP Expected length of stay: 14017 days  Clinical Impression Very pleasant, talkative gentleman here following a traumatic injuring when pinned by a tractor.  Multiple injuries and WB restrictions.  Reports pain management is his primary concern.  Pt and wife with good understanding of expected assistance needs upon d/c and report family already planning to build ramp for home entry.  Pt denies any post trauma symptoms or emotional distress.  Very strong faith that he relies on to cope.  Wife very attentive and supportive.  Will follow for support and d/c planning needs.  Zerenity Bowron 01/20/2016, 5:58 PM

## 2016-01-20 NOTE — Progress Notes (Signed)
[facility] PHYSICAL MEDICINE & REHABILITATION     PROGRESS NOTE    Subjective/Complaints: Had tough weekend with pain. Loose stools after bowel intervention for constipation.   ROS: Pt denies fever, rash/itching, headache, blurred or double vision, nausea, vomiting, abdominal pain,  chest pain, shortness of breath, palpitations, dysuria, dizziness,   bleeding, anxiety, or depression   Objective: Vital Signs: Blood pressure (!) 146/63, pulse 81, temperature 97.1 F (36.2 C), temperature source Oral, resp. rate 18, height 6\' 2"  (1.88 m), weight 101.6 kg (224 lb), SpO2 94 %. No results found.  Recent Labs  01/18/16 2059 01/20/16 0637  WBC 10.9* 10.1  HGB 7.5* 7.7*  HCT 24.0* 24.8*  PLT 246 256    Recent Labs  01/17/16 1845 01/20/16 0637  NA  --  139  K  --  4.8  CL  --  101  GLUCOSE  --  153*  BUN  --  19  CREATININE 1.05 0.97  CALCIUM  --  8.7*   CBG (last 3)   Recent Labs  01/19/16 1626 01/19/16 2039 01/20/16 0627  GLUCAP 96 144* 147*    Wt Readings from Last 3 Encounters:  01/17/16 101.6 kg (224 lb)  01/10/16 100.7 kg (222 lb)    Physical Exam:  Constitutional: He is oriented to person, place, and time. He appears well-developed.  HENT:  Head: Normocephalicand atraumatic.  Right Ear: External earnormal.  Left Ear: External earnormal.  Eyes: Conjunctivaeand EOMare normal. Pupils are equal, round, and reactive to light.  Neck: No tracheal deviationpresent. No thyromegalypresent.  Cardiovascular: Normal rate, regular rhythmand normal heart sounds.  Respiratory: Effort normaland breath sounds normal. No respiratory distress. He has no wheezes. He has no rales.  GI: Soft. Bowel sounds are normal. He exhibits no distension. There is no tenderness. There is no rebound.  Genitourinary: No penile tenderness. SPC in place Musculoskeletal: He exhibits edema. He exhibits no deformity.  Right leg tender to basic palpation and PROM at hip and knee.   Neurological: He is alertand oriented to person, place, and time.  UE 5/5 prox to distal. RLE: 1/5 HF, KE 3/5 ADF/PF. LLE: 3- HF, 3 KE and 4/5 ADF/PF. ?decreased LT bilateral distal lower extremities. Cognition appropriate.  Skin: Skin is warm.  Sacral wound with sheet of eschar/necrotic debris measuring about 3x5". Operative incision sites clean. Suprapubic catheter site clean and intact Psychiatric: He has a normal mood and affect. His behavior is normal. Judgmentand thought contentnormal  Assessment/Plan: 1. Functional and mobility deficits secondary to polytrauma which require 3+ hours per day of interdisciplinary therapy in a comprehensive inpatient rehab setting. Physiatrist is providing close team supervision and 24 hour management of active medical problems listed below. Physiatrist and rehab team continue to assess barriers to discharge/monitor patient progress toward functional and medical goals.  Function:  Bathing Bathing position   Position: Bed  Bathing parts Body parts bathed by patient: Right arm, Left arm, Chest, Abdomen, Right upper leg, Left upper leg Body parts bathed by helper: Right arm, Left arm, Chest, Abdomen  Bathing assist Assist Level:  (Max Assist)      Upper Body Dressing/Undressing Upper body dressing   What is the patient wearing?: Hospital gown                Upper body assist Assist Level: Touching or steadying assistance(Pt > 75%)      Lower Body Dressing/Undressing Lower body dressing   What is the patient wearing?: Underwear, Ted Hose, Non-skid slipper socks  Underwear - Performed by helper: Thread/unthread right underwear leg, Thread/unthread left underwear leg, Pull underwear up/down       Non-skid slipper socks- Performed by helper: Don/doff right sock, Don/doff left sock               TED Hose - Performed by helper: Don/doff right TED hose, Don/doff left TED hose  Lower body assist Assist for lower body dressing: 2  Helpers      Toileting Toileting     Toileting steps completed by helper: Adjust clothing prior to toileting, Performs perineal hygiene, Adjust clothing after toileting    Toileting assist Assist level:  (Total assist)   Transfers Chair/bed transfer Chair/bed transfer activity did not occur: Safety/medical concerns           Locomotion Ambulation Ambulation activity did not occur: Safety/medical concerns         Wheelchair Wheelchair activity did not occur: Safety/medical concerns        Cognition Comprehension Comprehension assist level: Follows complex conversation/direction with no assist  Expression Expression assist level: Expresses complex ideas: With no assist  Social Interaction Social Interaction assist level: Interacts appropriately with others - No medications needed.  Problem Solving Problem solving assist level: Solves basic 90% of the time/requires cueing < 10% of the time  Memory Memory assist level: Complete Independence: No helper   Medical Problem List and Plan: 1.  Multi pelvic fractures status post sacroiliac screw fixation secondary to trauma.              -Touchdown weightbearing right leg to facilitate transfers.              -Weightbearing as tolerated left leg for transfers only 8 weeks             -continue therapies 2.  DVT Prophylaxis/Anticoagulation: SQ Lovenox.Check vascular study 3. Pain Management: Neurontin 300 mg TID,Ultram 50-100 mg Q6 prn 4. Acute blood loss anemia.Fergon 324 mg bid. Follow-up CBC 5. Neuropsych: This patient is capable of making decisions on his own behalf. 6. Skin/Wound Care/Sacral wound: air mattress.  -hydrotherapy  -collagenase to wound  -nutrition (protein supps/low albumin) 7. Fluids/Electrolytes/Nutrition: Routine I&O with follow-up chemistries personally reviewed today 8. Urethral trauma. Status post suprapubic catheter. Follow-up with changes as directed. Urine clear/yellow at present 9. Diabetes mellitus  with peripheral neuropathy. Hemoglobin A1c 7.9. Levemir 15 units daily at bedtime, Glucophage 1000 mg twice a day, Glucotrol 10 mg before breakfast and 5 mg before supper.             - Check blood sugars before meals and at bedtime             -good control 10. Hypertension. Norvasc 7.5 mg daily. Monitor with increased mobility 11. Hyperlipidemia. Lipitor 12. Constipation. Laxative assist   LOS (Days) 3 A FACE TO FACE EVALUATION WAS PERFORMED  Dinah Lupa T 01/20/2016 9:10 AM

## 2016-01-20 NOTE — Consult Note (Addendum)
[facility] Nurse wound follow up Wound type: Re consulted to assess sacrum wound.  Refer to previous [place] note on 10/2; this is not a pressure injury related to hospitalization, it was a deep tissue injury which occurred during the patient's crush injury and was present on admission. Wound was also assessed by the plastic surgery team while pt was in the OR on 10/6 and wound had not fully evolved at that time.  Currently, it is a unstageable pressure injury; 8X4.5cm, 90% tightly adhered slough/eschar, 10% [place] purple deep tissue injury which is still evolving. Pt's wife at the bedside to assess wound appearance and discuss plan of care.  Pt has a large amt of difficulty moving related to pain and fractures.  He is on an air mattress to decrease pressure to the affected area.  He has recently developed diarrhea and is frequently incontinent of liquid stool and it is difficult to keep the wound from becoming soiled related to the location near the rectum. Measurement: 8X4.5cm Drainage (amount, consistency, odor) Mod amt yellow drainage Periwound: Intact skin surrounding Dressing procedure/placement/frequency: PT will begin hydrotherapy to assist with removal of nonviable tissue. This therapy is not available if patient is discharged home, and is only provided at a few SNF settings. Santyl ointment for enzymatic debridement.  Expect this wound to evolve into full thickness tissue loss as nonviable tissue is removed. Discussed importance of positioning off the affected area as often as possible with the patient. Please re-consult if further assistance is needed.  Thank-you,  Julien Girt MSN, [place], [place], [place], [place]

## 2016-01-21 ENCOUNTER — Inpatient Hospital Stay (HOSPITAL_COMMUNITY): Payer: Medicare Other | Admitting: Physical Therapy

## 2016-01-21 ENCOUNTER — Ambulatory Visit (HOSPITAL_COMMUNITY): Payer: Medicare Other

## 2016-01-21 ENCOUNTER — Inpatient Hospital Stay (HOSPITAL_COMMUNITY): Payer: Medicare Other

## 2016-01-21 ENCOUNTER — Inpatient Hospital Stay (HOSPITAL_COMMUNITY): Payer: Medicare Other | Admitting: Occupational Therapy

## 2016-01-21 LAB — GLUCOSE, CAPILLARY
GLUCOSE-CAPILLARY: 96 mg/dL (ref 65–99)
Glucose-Capillary: 100 mg/dL — ABNORMAL HIGH (ref 65–99)
Glucose-Capillary: 158 mg/dL — ABNORMAL HIGH (ref 65–99)
Glucose-Capillary: 84 mg/dL (ref 65–99)

## 2016-01-21 MED ORDER — DOCUSATE SODIUM 100 MG PO CAPS
100.0000 mg | ORAL_CAPSULE | Freq: Two times a day (BID) | ORAL | Status: DC
  Administered 2016-01-21 – 2016-01-27 (×13): 100 mg via ORAL
  Filled 2016-01-21 (×14): qty 1

## 2016-01-21 NOTE — Progress Notes (Signed)
PHYSICAL MEDICINE & REHABILITATION     PROGRESS NOTE    Subjective/Complaints: Had tough weekend with pain. Loose stools after bowel intervention for constipation.   ROS: Pt denies fever, rash/itching, headache, blurred or double vision, nausea, vomiting, abdominal pain,  chest pain, shortness of breath, palpitations, dysuria, dizziness,   bleeding, anxiety, or depression   Objective: Vital Signs: Blood pressure 134/61, pulse 66, temperature 98.1 F (36.7 C), temperature source Oral, resp. rate 18, height 6\' 2"  (1.88 m), weight 101.6 kg (224 lb), SpO2 98 %. No results found.  Recent Labs  01/18/16 2059 01/20/16 0637  WBC 10.9* 10.1  HGB 7.5* 7.7*  HCT 24.0* 24.8*  PLT 246 256    Recent Labs  01/20/16 0637  NA 139  K 4.8  CL 101  GLUCOSE 153*  BUN 19  CREATININE 0.97  CALCIUM 8.7*   CBG (last 3)   Recent Labs  01/20/16 1622 01/20/16 2025 01/21/16 0635  GLUCAP 135* 158* 100*    Wt Readings from Last 3 Encounters:  01/17/16 101.6 kg (224 lb)  01/10/16 100.7 kg (222 lb)    Physical Exam:  Constitutional: He is oriented to person, place, and time. He appears well-developed.  HENT:  Head: Normocephalicand atraumatic.  Right Ear: External earnormal.  Left Ear: External earnormal.  Eyes: Conjunctivaeand EOMare normal. Pupils are equal, round, and reactive to light.  Neck: No tracheal deviationpresent. No thyromegalypresent.  Cardiovascular: Normal rate, regular rhythmand normal heart sounds.  Respiratory: Effort normaland breath sounds normal. No respiratory distress. He has no wheezes. He has no rales.  GI: Soft. Bowel sounds are normal. He exhibits no distension. There is no tenderness. There is no rebound.  Genitourinary: No penile tenderness. SPC in place Musculoskeletal: He exhibits edema. He exhibits no deformity.  Right leg tender to basic palpation and PROM at hip and knee.  Neurological: He is alertand oriented to person,  place, and time.  UE 5/5 prox to distal. RLE: 1/5 HF, KE 3/5 ADF/PF. LLE: 3- HF, 3 KE and 4/5 ADF/PF. ?decreased LT bilateral distal lower extremities. Cognition appropriate.  Skin: Skin is warm.  Sacral wound with sheet of eschar/necrotic debris measuring about 3x5". Operative incision sites clean. Suprapubic catheter site clean and intact Psychiatric: He has a normal mood and affect. His behavior is normal. Judgmentand thought contentnormal  Assessment/Plan: 1. Functional and mobility deficits secondary to polytrauma which require 3+ hours per day of interdisciplinary therapy in a comprehensive inpatient rehab setting. Physiatrist is providing close team supervision and 24 hour management of active medical problems listed below. Physiatrist and rehab team continue to assess barriers to discharge/monitor patient progress toward functional and medical goals.  Function:  Bathing Bathing position   Position: Bed  Bathing parts Body parts bathed by patient: Right arm, Left arm, Chest, Abdomen, Right upper leg, Left upper leg, Front perineal area Body parts bathed by helper: Buttocks, Right lower leg, Left lower leg, Back  Bathing assist Assist Level:  (Max Assist)      Upper Body Dressing/Undressing Upper body dressing   What is the patient wearing?: Pull over shirt/dress     Pull over shirt/dress - Perfomed by patient: Thread/unthread right sleeve, Thread/unthread left sleeve, Put head through opening Pull over shirt/dress - Perfomed by helper: Pull shirt over trunk        Upper body assist Assist Level: Touching or steadying assistance(Pt > 75%)      Lower Body Dressing/Undressing Lower body dressing   What is the  patient wearing?: [employer], Pants, Shoes   Underwear - Performed by helper: Thread/unthread right underwear leg, Thread/unthread left underwear leg, Pull underwear up/down   Pants- Performed by helper: Thread/unthread right pants leg, Thread/unthread left pants  leg, Pull pants up/down   Non-skid slipper socks- Performed by helper: Don/doff right sock, Don/doff left sock       Shoes - Performed by helper: Don/doff right shoe, Don/doff left shoe, Fasten right, Fasten left       TED Hose - Performed by helper: Don/doff right TED hose, Don/doff left TED hose  Lower body assist Assist for lower body dressing: 2 Automotive engineer activity did not occur: Safety/medical concerns   Toileting steps completed by helper: Adjust clothing prior to toileting, Performs perineal hygiene, Adjust clothing after toileting    Toileting assist Assist level:  (Total assist)   Transfers Chair/bed transfer Chair/bed transfer activity did not occur: Safety/medical concerns Chair/bed transfer method: Lateral scoot, Squat pivot Chair/bed transfer assist level: Maximal assist (Pt 25 - 49%/lift and lower) Chair/bed transfer assistive device: Armrests     Locomotion Ambulation Ambulation activity did not occur: Safety/medical concerns         Wheelchair Wheelchair activity did not occur: Safety/medical concerns     Assist Level:  (unable to tolerate sitting up or transfer to wc)  Cognition Comprehension Comprehension assist level: Follows complex conversation/direction with no assist  Expression Expression assist level: Expresses complex ideas: With no assist  Social Interaction Social Interaction assist level: Interacts appropriately with others - No medications needed.  Problem Solving Problem solving assist level: Solves basic 90% of the time/requires cueing < 10% of the time  Memory Memory assist level: Complete Independence: No helper   Medical Problem List and Plan: 1.  Multi pelvic fractures status post sacroiliac screw fixation secondary to trauma.              -Touchdown weightbearing right leg to facilitate transfers.              -Weightbearing as tolerated left leg for transfers only 8 weeks             -team  conference today 2.  DVT Prophylaxis/Anticoagulation: SQ Lovenox.Check vascular study 3. Pain Management: Neurontin 300 mg TID,Ultram 50-100 mg Q6 prn  -added fentanyl and hydrocodone with goood results 4. Acute blood loss anemia.Fergon 324 mg bid. Follow-up CBC 5. Neuropsych: This patient is capable of making decisions on his own behalf. 6. Skin/Wound Care/Sacral wound: air mattress.  -hydrotherapy to begin today  -collagenase to wound daily  -nutrition (protein supps/low albumin) 7. Fluids/Electrolytes/Nutrition: encourage PO 8. Urethral trauma. Status post suprapubic catheter. Follow-up with changes as directed. Urine remains clear/yellow   9. Diabetes mellitus with peripheral neuropathy. Hemoglobin A1c 7.9. Levemir 15 units daily at bedtime, Glucophage 1000 mg twice a day, Glucotrol 10 mg before breakfast and 5 mg before supper.             - Check blood sugars before meals and at bedtime             -good control 10. Hypertension. Norvasc 7.5 mg daily. Monitor with increased mobility 11. Hyperlipidemia. Lipitor 12. Constipation. Laxative assist   LOS (Days) 4 A FACE TO FACE EVALUATION WAS PERFORMED  Jennifermarie Franzen T 01/21/2016 9:08 AM

## 2016-01-21 NOTE — Consult Note (Signed)
Urology Consult Note   Requesting Attending Physician:  [name], MD Service Providing Consult: Urology Consulting Attending: Gaynelle Arabian, MD   Assessment:  Patient is a 76 y.o. male with history of DM, HTN, HLD presents with urethral injury s/p crush injury on 01/10/16 at ~4pm after tractor accident. CT A/P shows large extraperitoneal clot anterior to the bladder in the space of Retzius and RUG showed abrupt cessation of contrast opacification at the proximal bulbar urethra. Bedside cystoscopy by urology unsuccessful due to seemingly significant urethral injury near the membranous urethra. Known pelvic fractures secondary to trauma as well. S/p SPT placement with IR with 12Fr SPT on evening of 9/29.   Interval: Since been transferred to inpatient rehab. AFVSS. Adequate UOP via SPT.   Recommendations: 1. Continue SPT to straight drain 2. Do not place anything via urethra 3. Suprapubic catheter will need to be exchanged q4 weeks and with IR at least for first few exchanges. Should try to upsize catheter size with each exchange. Please have first appointment set up prior to discharge 4. Elevate scrotum with towels and kerlix to treat edema when lying flat or in chair 5. Plan for outpatient cystoscopic evaluation in about 3 months with [facility], SPT until that time. Urology follow up has been requested.  Thank you for this consult. Please contact the urology consult pager with any further questions/concerns. We will sign off.  Sharmaine Base, MD Urology Surgical Resident  Subjective: Doing well. Having some pain with working with OT trying to get to side of bed.   Objective   Vital signs in last 24 hours: BP 134/61 (BP Location: Left Arm)   Pulse 66   Temp 98.1 F (36.7 C) (Oral)   Resp 18   Ht 6\' 2"  (1.88 m)   Wt 224 lb (101.6 kg)   SpO2 98%   BMI 28.76 kg/m   Intake/Output last 3 shifts: I/O last 3 completed shifts: In: 700 [P.O.:700] Out: 2600  [Urine:2600]  Physical Exam General: appears comfortable, A&O, resting, elderly male HEENT: [place]/AT, EOMI, MMM Pulmonary: Normal work of breathing on RA Cardiovascular: HDS, adequate peripheral perfusion Abdomen: soft, ND, NTTP, no flank ecchymosis, suprapubic in place (12Fr) without significant surrounding erythema GU: uncircumcised male phallus, penile edema, bilateral descended testicles, penile/scrotal/perineal ecchymosis (butterfly hematoma), SPT draining light yellow urine without hematuria Extremities: warm and well perfused, no edema  Most Recent Labs: Lab Results  Component Value Date   WBC 10.1 01/20/2016   HGB 7.7 (L) 01/20/2016   HCT 24.8 (L) 01/20/2016   PLT 256 01/20/2016    Lab Results  Component Value Date   NA 139 01/20/2016   K 4.8 01/20/2016   CL 101 01/20/2016   CO2 29 01/20/2016   BUN 19 01/20/2016   CREATININE 0.97 01/20/2016   CALCIUM 8.7 (L) 01/20/2016    Lab Results  Component Value Date   ALKPHOS 68 01/20/2016   BILITOT 1.8 (H) 01/20/2016   PROT 5.7 (L) 01/20/2016   ALBUMIN 2.7 (L) 01/20/2016   ALT 19 01/20/2016   AST 27 01/20/2016    Lab Results  Component Value Date   INR 1.28 01/11/2016   APTT 30 01/11/2016     Urine Culture: None   IMAGING: No results found.

## 2016-01-21 NOTE — Progress Notes (Signed)
Physical Therapy Session Note  Patient Details  Name: Christian [name] MRN: [id number] Date of Birth: Jul 03, 1940  Today's Date: 01/21/2016 PT Individual Time: 1500-1530 PT Individual Time Calculation (min): 30 min    Short Term Goals: Week 1:  PT Short Term Goal 1 (Week 1): patient will perform bed mobility with mod A from PT.  PT Short Term Goal 2 (Week 1): Patient will perform stand pivot/squat pivot with mod A.  PT Short Term Goal 3 (Week 1): Patient will performed WC mobility for 133ft with Supervision.  PT Short Term Goal 4 (Week 1): Patient will be able to participate in OOB activity for up to 2 hours.  Skilled Therapeutic Interventions/Progress Updates:    Session focused on bed mobility retraining and sitting tolerance. Pt requires significant amount of extra time to complete all tasks and +2 assist with focus on patient directing care. Pt only able to tolerate less than a minute EOB before complaining of pain and dizziness. Education and encouragement provided throughout session. Pt reports planning to have hospital bed at home upon discharge to aid with mobility.    Therapy Documentation Precautions:  Precautions Precautions: Fall Restrictions Weight Bearing Restrictions: Yes RLE Weight Bearing: Non weight bearing LLE Weight Bearing: Weight bearing as tolerated Other Position/Activity Restrictions: LLE WBAT for transfers only  Pain: Premedicated for pain. unrated in R hip/pelvic area.    See Function Navigator for Current Functional Status.   Therapy/Group: Individual Therapy  [name], PT, DPT  01/21/2016, 3:31 PM

## 2016-01-21 NOTE — Progress Notes (Signed)
Physical Therapy Note  Patient Details  Name: Christian [name] MRN: [id number] Date of Birth: August 07, 1940 Today's Date: 01/21/2016    Time: 730-815 45 minutes  1:1 Pt with no c/o pain at rest, c/o 7-8/10 pain with Rt LE movement and with sitting in upright position.  Pt assisted with donning shorts with pt able to partially bridge and wt shift to pull pants up himself, requires assist to thread bilat LEs into pants.  Supine to sit with pt instructing his own care 80% of the time. Pt requires +2 assist as he requires assist for Rt LE management and for trunk support to come upright. Pt able to tolerate sitting edge of bed 5 minutes, limited by pain in Rt hip.  Pt +2 assist for sit to supine.  Pt given handout of supine LE therex for pt and wife to perform in room, both expressed understanding.  Pt left in bed for hydrotherapy.   Keyonta Madrid 01/21/2016, 8:15 AM

## 2016-01-21 NOTE — Progress Notes (Signed)
Occupational Therapy Session Note  Patient Details  Name: Christian [name] MRN: [id number] Date of Birth: [birth year]  Today's Date: 01/21/2016 OT Individual Time: 1000-1100 OT Individual Time Calculation (min): 60 min     Short Term Goals:Week 1:  OT Short Term Goal 1 (Week 1): Patient will complete upper body bathind and dressing at sink with min assist at w/c level. OT Short Term Goal 2 (Week 1): Patient will complete SPT from w/c to toilet with mod assist OT Short Term Goal 3 (Week 1): Pt will perform bed mobility using DME/AE, prn, with mod assist to manage BLE OT Short Term Goal 4 (Week 1): Patient will don pants with max assist to stand supported to pull up pants. OT Short Term Goal 5 (Week 1): Patient will complete lower body bathing at bed level with mod assist to wash buttocks and lower legs using AE, prn.  Skilled Therapeutic Interventions/Progress Updates:    Pt seen for OT session focusing on OOB tolerance, pain management, and LEs movement from bed level. Pt in supine upon arrival, agreeable to tx session. Pt denied pain at rest in supine, however, elevated to 8/10 pain with mobility in buttocks/ R hip.  With significantly increased time and +2 assist, pt came to sitting EOB with total A. He tolerated ~10 minutes seated EOB with B UE support and min A. Manual assist needed from therapist for positioning of LEs for increased BOS. Attempted having pt letting go with UE, however, due to anxiety and pain, pt unable to tolerate.  +2 assist required to return to supine. From supine position, pt completed UB/LB bathing, declining to change clothes. Using leg lifter, pt able to manage L LE to place into modified circle sit position, and from there pt able to wash down to ankle, requiring assist for foot. He required increased assist for management of R LE due to pain/ stiffness. Hip flexor and hamstring stretch performed. Instructed pt on use of bed sheet to assist with LE management and to  provide self stretching, cont education and encouragement required.  Pt left in supine at end of session, all needs in reach.   Therapy Documentation Precautions:  Precautions Precautions: Fall Restrictions Weight Bearing Restrictions: Yes RLE Weight Bearing: Non weight bearing LLE Weight Bearing: Weight bearing as tolerated Other Position/Activity Restrictions: LLE WBAT for transfers only ADL: ADL ADL Comments: see Functional Assessment  See Function Navigator for Current Functional Status.   Therapy/Group: Individual Therapy  [name], Niah Heinle C 01/21/2016, 6:50 AM
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
## 2016-01-21 NOTE — Progress Notes (Signed)
Physical Therapy Session Note  Patient Details  Name: Christian [name] MRN: [id number] Date of Birth: 08/05/1940  Today's Date: 01/21/2016 PT Individual Time: 1400-1430 PT Individual Time Calculation (min): 30 min    Short Term Goals: Week 1:  PT Short Term Goal 1 (Week 1): patient will perform bed mobility with mod A from PT.  PT Short Term Goal 2 (Week 1): Patient will perform stand pivot/squat pivot with mod A.  PT Short Term Goal 3 (Week 1): Patient will performed WC mobility for 12f with Supervision.  PT Short Term Goal 4 (Week 1): Patient will be able to participate in OOB activity for up to 2 hours.  Skilled Therapeutic Interventions/Progress Updates:   Pt presented in bed.  Pt edu on benefits of mobilty as pt expressed desire to hold therapy until sacral wound healed. Pt voiced concerns of using RLE during repositioning for pain management. PTA encouraged use of leg lifter as pt stated would feel relief when leg elevated. With PTA encouragement pt able to use leg lifter to perform AAROM.  Performed 8-10reps of AA hip abd and heel slides/knee flexion.  AROM SAQ, GA, QS, x 10 ea. Pt able to lay flat and reposition to provide pressure relief when needed. Pt left in bed with all current needs met.   Therapy Documentation Precautions:  Precautions Precautions: Fall Restrictions Weight Bearing Restrictions: Yes RLE Weight Bearing: Non weight bearing LLE Weight Bearing: Weight bearing as tolerated Other Position/Activity Restrictions: LLE WBAT for transfers only General:   Vital Signs: Therapy Vitals Temp: 97.7 F (36.5 C) Temp Source: Oral Pulse Rate: 86 Resp: 18 BP: (!) 123/58 Patient Position (if appropriate): Lying Oxygen Therapy SpO2: 100 % O2 Device: Not Delivered Pain: Pain Assessment Pain Score: 3  Pain Type: Acute pain Pain Location: Hip Pain Orientation: Right Pain Descriptors / Indicators: Aching Pain Intervention(s): Medication (See eMAR) Mobility:      See Function Navigator for Current Functional Status.   Therapy/Group: Individual Therapy  Chananya Canizalez  Katessa Attridge, PTA  01/21/2016, 4:10 PM
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
## 2016-01-22 ENCOUNTER — Inpatient Hospital Stay (HOSPITAL_COMMUNITY): Payer: Medicare Other | Admitting: Occupational Therapy

## 2016-01-22 ENCOUNTER — Inpatient Hospital Stay (HOSPITAL_COMMUNITY): Payer: Medicare Other | Admitting: Physical Therapy

## 2016-01-22 ENCOUNTER — Ambulatory Visit (HOSPITAL_COMMUNITY): Payer: Medicare Other

## 2016-01-22 ENCOUNTER — Encounter (HOSPITAL_COMMUNITY): Payer: Self-pay | Admitting: Orthopedic Surgery

## 2016-01-22 LAB — GLUCOSE, CAPILLARY
GLUCOSE-CAPILLARY: 106 mg/dL — AB (ref 65–99)
Glucose-Capillary: 122 mg/dL — ABNORMAL HIGH (ref 65–99)
Glucose-Capillary: 106 mg/dL — ABNORMAL HIGH (ref 65–99)
Glucose-Capillary: 95 mg/dL (ref 65–99)

## 2016-01-22 MED ORDER — FENTANYL 25 MCG/HR TD PT72
25.0000 ug | MEDICATED_PATCH | TRANSDERMAL | Status: DC
  Administered 2016-01-22 – 2016-01-28 (×3): 25 ug via TRANSDERMAL
  Filled 2016-01-22 (×3): qty 1

## 2016-01-22 NOTE — Progress Notes (Signed)
Please clarify wound dsg change at 5am, note which wound. Narda Rutherford

## 2016-01-22 NOTE — Progress Notes (Signed)
Physical Therapy Wound Treatment Patient Details  Name: Christian [name] MRN: [id number] Date of Birth: 05/26/1940  Today's Date: 01/22/2016 PT Individual Time: 0835-0907 PT Individual Time Calculation (min): 32 min    Subjective Subjective: Pt pleasant and agreeable to therapy Patient and Family Stated Goals: Heal wound Date of Onset:  (Unknown)  Pain Score: Pain Score: Pt reports no pain at wound site during hydrotherapy however pain from pelvic fracture was limiting time in sidelying position for treatment.   Wound Assessment  Pressure Injury 01/17/16 Unstageable - Full thickness tissue loss in which the base of the ulcer is covered by slough (yellow, tan, gray, green or brown) and/or eschar (tan, brown or black) in the wound bed. purple discolored oval area reportedly from cr (Active)  Dressing Type ABD;Barrier Film (skin prep);Gauze (Comment);Moist to dry 01/22/2016  9:21 AM  Dressing Changed;Clean;Dry;Intact 01/22/2016  9:21 AM  Dressing Change Frequency Daily 01/22/2016  9:21 AM  State of Healing Eschar 01/22/2016  9:21 AM  Site / Wound Assessment Black;Brown;Yellow;Pink;Bleeding 01/22/2016  9:21 AM  % Wound base Red or Granulating 10% 01/22/2016  9:21 AM  % Wound base Yellow 75% 01/22/2016  9:21 AM  % Wound base Black 15% 01/22/2016  9:21 AM  % Wound base Other (Comment) 0% 01/22/2016  9:21 AM  Peri-wound Assessment Intact;Erythema (blanchable) 01/22/2016  9:21 AM  Wound Length (cm) 7.9 cm 01/21/2016  8:00 AM  Wound Width (cm) 5 cm 01/21/2016  8:00 AM  Wound Depth (cm) 0.1 cm 01/21/2016  8:00 AM  Margins Unattached edges (unapproximated) 01/22/2016  9:21 AM  Drainage Amount Moderate 01/22/2016  9:21 AM  Drainage Description Purulent;Odor 01/22/2016  9:21 AM  Treatment Debridement (Selective);Hydrotherapy (Pulse lavage);Packing (Saline gauze) 01/22/2016  9:21 AM  Santyl applied to wound bed prior to applying dressing.   Hydrotherapy Pulsed lavage therapy - wound location:  Sacrum Pulsed Lavage with Suction (psi): 12 psi Pulsed Lavage with Suction - Normal Saline Used: 1000 mL Pulsed Lavage Tip: Tip with splash shield Selective Debridement Selective Debridement - Location: Sacrum Selective Debridement - Tools Used: Forceps;Scalpel Selective Debridement - Tissue Removed: Yellow and brown/black necrotic tissue   Wound Assessment and Plan  Wound Therapy - Assess/Plan/Recommendations Wound Therapy - Clinical Statement: Pt will benefit from continued hydrotherapy for selective removal of nonviable tissue, and to promote wound bed healing.  Wound Therapy - Functional Problem List: Decreased tolerance for OOB sitting in chair Factors Delaying/Impairing Wound Healing: Diabetes Mellitus;Immobility Hydrotherapy Plan: Debridement;Dressing change;Patient/family education;Pulsatile lavage with suction Wound Therapy - Frequency: 6X / week Wound Therapy - Follow Up Recommendations: Home health RN Wound Plan: See above  Wound Therapy Goals- Improve the function of patient's integumentary system by progressing the wound(s) through the phases of wound healing (inflammation - proliferation - remodeling) by: Decrease Necrotic Tissue to: 25% Decrease Necrotic Tissue - Progress: Progressing toward goal Increase Granulation Tissue to: 75% Increase Granulation Tissue - Progress: Progressing toward goal Goals/treatment plan/discharge plan were made with and agreed upon by patient/family: Yes Time For Goal Achievement: 7 days Wound Therapy - Potential for Goals: Excellent  Goals will be updated until maximal potential achieved or discharge criteria met.  Discharge criteria: when goals achieved, discharge from hospital, MD decision/surgical intervention, no progress towards goals, refusal/missing three consecutive treatments without notification or medical reason.  GP     ,  01/22/2016, 9:29 AM    , PT, DPT Acute Rehabilitation Services Pager:  [phone]    

## 2016-01-22 NOTE — Progress Notes (Signed)
Physical Therapy Session Note  Patient Details  Name: Christian [name] MRN: [id number] Date of Birth: [birth year]  Today's Date: 01/22/2016 PT Individual Time: 1330-1500 PT Individual Time Calculation (min): 90 min    Short Term Goals: Week 1:  PT Short Term Goal 1 (Week 1): patient will perform bed mobility with mod A from PT.  PT Short Term Goal 2 (Week 1): Patient will perform stand pivot/squat pivot with mod A.  PT Short Term Goal 3 (Week 1): Patient will performed WC mobility for 156ft with Supervision.  PT Short Term Goal 4 (Week 1): Patient will be able to participate in OOB activity for up to 2 hours.  Skilled Therapeutic Interventions/Progress Updates:   Pt received supine in bed, c/o pain as below and agreeable to treatment. Performed rolling L with modA to don brief; unable to tolerate rolling to R side d/t pain. Educated pt in A/P transfer and pt agreeable to try to get into chair. Required maxA +2 for pivot on bed and backwards scoot into chair due to significant pain and pt with pain-avoidant posturing/positioning, however does direct care for LE positioning to reduce pain. Pt able to tolerate sitting in chair < 3 min before requesting to go back to bed; states he cannot find a comfortable position. Squat pivot transfer totalA +2 back to bed due to pt inability to tolerate forward flexion at hips to sit upright and assist with transfer. Sit >supine maxA +2. Scooting toward [facility] performed with S using bedrails over head. Pt required several minute rest break to allow pain to subside. PROM to BLE including hip flexion, hip internal/extenal rotation, abduction/adduction, and hamstring stretch. Remained supine in bed at end of session, all needs in reach.   Therapy Documentation Precautions:  Precautions Precautions: Fall Restrictions Weight Bearing Restrictions: Yes RLE Weight Bearing: Non weight bearing LLE Weight Bearing: Weight bearing as tolerated Other Position/Activity  Restrictions: LLE WBAT for transfers only Pain: Pain Assessment Pain Assessment: 0-10 Pain Score: 4  Pain Type: Acute pain;Surgical pain Pain Location: Hip Pain Orientation: Right Pain Descriptors / Indicators: Aching;Sharp Pain Frequency: Constant Pain Onset: On-going Patients Stated Pain Goal: 4 Pain Intervention(s): Medication (See eMAR)   See Function Navigator for Current Functional Status.   Therapy/Group: Individual Therapy  [name] 01/22/2016, 3:01 PM

## 2016-01-22 NOTE — Progress Notes (Signed)
Occupational Therapy Session Note  Patient Details  Name: Christian [name] MRN: [id number] Date of Birth: 1940/11/06  Today's Date: 01/22/2016 OT Individual Time: 1000-1130 OT Individual Time Calculation (min): 90 min     Short Term Goals:Week 1:  OT Short Term Goal 1 (Week 1): Patient will complete upper body bathind and dressing at sink with min assist at w/c level. OT Short Term Goal 2 (Week 1): Patient will complete SPT from w/c to toilet with mod assist OT Short Term Goal 3 (Week 1): Pt will perform bed mobility using DME/AE, prn, with mod assist to manage BLE OT Short Term Goal 4 (Week 1): Patient will don pants with max assist to stand supported to pull up pants. OT Short Term Goal 5 (Week 1): Patient will complete lower body bathing at bed level with mod assist to wash buttocks and lower legs using AE, prn.  Skilled Therapeutic Interventions/Progress Updates:    Pt seen for OT session focusing on functional mobility, bed mobility, pain management and UE strengthening. Pt in supine upon arrival, voicing 8/10 pain in buttock, however, willing to attempt therapy.  In supine, pt completed LE ROM activities in prep for mobility utilizing leg lifter to manage LEs during exercises and during bed mobility throughout session.  With significantly increased time, pt came sitting EOB with max A +1 using hospital bed functions. Pt tolerated ~5 minutes sitting EOB with B UE support, steadying assist, and L lean due to pain when weight shifting to the R. While seated EOB, pt with incontinent BM, he returned to supine and total A required for hygiene. Pt's wound bandage soiled during BM and therefore new pants not donned as wound team had been contact. Following bed mobility pt voiced pain 15/10- all medications had already been administered.  In supine, pt completed overhead press, chest press, and bicep curls x3 reps of 10 with verbal and demonstrational cues for proper form and technique. Pt required  +2 assist to roll due to pain management and assist with positioning. Pt voiced increased comfort with sidelying, therefore, pt left positioned in L sidelying at end of session, call bell in reach. RN made aware.   Therapy Documentation Precautions:  Precautions Precautions: Fall Restrictions Weight Bearing Restrictions: Yes RLE Weight Bearing: Non weight bearing LLE Weight Bearing: Weight bearing as tolerated Other Position/Activity Restrictions: LLE WBAT for transfers only ADL: ADL ADL Comments: see Functional Assessment  See Function Navigator for Current Functional Status.   Therapy/Group: Individual Therapy  [name], Alroy Portela C 01/22/2016, 6:47 AM

## 2016-01-22 NOTE — Progress Notes (Signed)
Social Work Patient ID: Christian [name], male   DOB: 17-Jan-1941, 75 y.o.   MRN: [id number]   Have reviewed team conference with pt and left VM for wife.  Aware and agreeable with targeted d/c date of 10/25 with min assist goals overall.  He reports that son-in-law currently at his home taking measurement for a ramp.  Continue to follow.  [name], LCSW

## 2016-01-22 NOTE — Care Management Note (Signed)
[facility] Individual Statement of Services  Patient Name:  Christian [name]  Date:  01/20/2016  Welcome to the [facility].  Our goal is to provide you with an individualized program based on your diagnosis and situation, designed to meet your specific needs.  With this comprehensive rehabilitation program, you will be expected to participate in at least 3 hours of rehabilitation therapies Monday-Friday, with modified therapy programming on the weekends.  Your rehabilitation program will include the following services:  Physical Therapy (PT), Occupational Therapy (OT), 24 hour per day rehabilitation nursing, Therapeutic Recreaction (TR), Neuropsychology, Case Management (Social Worker), Rehabilitation Medicine, Nutrition Services and Pharmacy Services  Weekly team conferences will be held on Tuesdays to discuss your progress.  Your Social Worker will talk with you frequently to get your input and to update you on team discussions.  Team conferences with you and your family in attendance may also be held.  Expected length of stay: 14-17 days  Overall anticipated outcome: minimal assistance  Depending on your progress and recovery, your program may change. Your Social Worker will coordinate services and will keep you informed of any changes. Your Social Worker's name and contact numbers are listed  below.  The following services may also be recommended but are not provided by the [facility] will be made to provide these services after discharge if needed.  Arrangements include referral to agencies that provide these services.  Your insurance has been verified to be:  Medicare and [place] Your primary doctor is:  Dr. Noberto Retort [facility])  Pertinent information will be shared with your doctor and your insurance  company.  Social Worker:  [place], [place] or ([phone]   Information discussed with and copy given to patient by: Lennart Pall, 01/20/2016, 5:19 PM

## 2016-01-22 NOTE — Progress Notes (Signed)
[facility] PHYSICAL MEDICINE & REHABILITATION     PROGRESS NOTE    Subjective/Complaints: Slept well. Pain better. Still struggle quite a bit with therapy however due to pain. Eating well.  ROS: Pt denies fever, rash/itching, headache, blurred or double vision, nausea, vomiting, abdominal pain,  chest pain, shortness of breath, palpitations, dysuria, dizziness,   bleeding, anxiety, or depression   Objective: Vital Signs: Blood pressure (!) 152/71, pulse 78, temperature 98.7 F (37.1 C), temperature source Oral, resp. rate 18, height 6\' 2"  (1.88 m), weight 104.8 kg (231 lb 1.6 oz), SpO2 95 %. No results found.  Recent Labs  01/20/16 0637  WBC 10.1  HGB 7.7*  HCT 24.8*  PLT 256    Recent Labs  01/20/16 0637  NA 139  K 4.8  CL 101  GLUCOSE 153*  BUN 19  CREATININE 0.97  CALCIUM 8.7*   CBG (last 3)   Recent Labs  01/21/16 1611 01/21/16 2115 01/22/16 0636  GLUCAP 84 96 106*    Wt Readings from Last 3 Encounters:  01/22/16 104.8 kg (231 lb 1.6 oz)  01/10/16 100.7 kg (222 lb)    Physical Exam:  Constitutional: He is oriented to person, place, and time. He appears well-developed.  HENT:  Head: Normocephalicand atraumatic.  Right Ear: External earnormal.  Left Ear: External earnormal.  Eyes: Conjunctivaeand EOMare normal. Pupils are equal, round, and reactive to light.  Neck: No tracheal deviationpresent. No thyromegalypresent.  Cardiovascular: Normal rate, regular rhythmand normal heart sounds.  Respiratory: Effort normaland breath sounds normal. No respiratory distress. He has no wheezes. He has no rales.  GI: Soft. Bowel sounds are normal. He exhibits no distension. There is no tenderness. There is no rebound.  Genitourinary: No penile tenderness. SPC in place Musculoskeletal: He exhibits edema. He exhibits no deformity.  Right leg tender to basic palpation and PROM at hip and knee.  Neurological: He is alertand oriented to person, place, and  time.  UE 5/5 prox to distal. RLE: 1/5 HF, KE 3/5 ADF/PF. LLE: 3- HF, 3 KE and 4/5 ADF/PF. ?decreased LT bilateral distal lower extremities. Cognition appropriate.  Skin: Skin is warm.  Sacral wound with sheet of eschar/necrotic debris measuring about 3x5". Operative incision sites clean. Suprapubic catheter site clean and intact Psychiatric: He has a normal mood and affect. His behavior is normal. Judgmentand thought contentnormal  Assessment/Plan: 1. Functional and mobility deficits secondary to polytrauma which require 3+ hours per day of interdisciplinary therapy in a comprehensive inpatient rehab setting. Physiatrist is providing close team supervision and 24 hour management of active medical problems listed below. Physiatrist and rehab team continue to assess barriers to discharge/monitor patient progress toward functional and medical goals.  Function:  Bathing Bathing position   Position: Bed  Bathing parts Body parts bathed by patient: Right arm, Left arm, Chest, Abdomen, Right upper leg, Left upper leg, Front perineal area Body parts bathed by helper: Buttocks, Right lower leg, Left lower leg, Back  Bathing assist Assist Level:  (Max Assist)      Upper Body Dressing/Undressing Upper body dressing   What is the patient wearing?: Pull over shirt/dress     Pull over shirt/dress - Perfomed by patient: Thread/unthread right sleeve, Thread/unthread left sleeve, Put head through opening Pull over shirt/dress - Perfomed by helper: Pull shirt over trunk        Upper body assist Assist Level: Touching or steadying assistance(Pt > 75%)      Lower Body Dressing/Undressing Lower body dressing  What is the patient wearing?: [employer], Pants, Shoes   Underwear - Performed by helper: Thread/unthread right underwear leg, Thread/unthread left underwear leg, Pull underwear up/down   Pants- Performed by helper: Thread/unthread right pants leg, Thread/unthread left pants leg, Pull  pants up/down   Non-skid slipper socks- Performed by helper: Don/doff right sock, Don/doff left sock       Shoes - Performed by helper: Don/doff right shoe, Don/doff left shoe, Fasten right, Fasten left       TED Hose - Performed by helper: Don/doff right TED hose, Don/doff left TED hose  Lower body assist Assist for lower body dressing: 2 Helpers      Toileting Toileting     Toileting steps completed by helper: Adjust clothing prior to toileting, Performs perineal hygiene, Adjust clothing after toileting Toileting Assistive Devices: Grab bar or rail  Toileting assist Assist level: Two helpers   Transfers Chair/bed transfer Chair/bed transfer activity did not occur: Safety/medical concerns Chair/bed transfer method: Lateral scoot, Squat pivot Chair/bed transfer assist level: Maximal assist (Pt 25 - 49%/lift and lower) Chair/bed transfer assistive device: Armrests     Locomotion Ambulation Ambulation activity did not occur: Safety/medical concerns         Wheelchair Wheelchair activity did not occur: Safety/medical concerns     Assist Level:  (unable to tolerate sitting up or transfer to wc)  Cognition Comprehension Comprehension assist level: Follows complex conversation/direction with no assist  Expression Expression assist level: Expresses complex ideas: With no assist  Social Interaction Social Interaction assist level: Interacts appropriately with others - No medications needed.  Problem Solving Problem solving assist level: Solves complex problems: Recognizes & self-corrects  Memory Memory assist level: Complete Independence: No helper   Medical Problem List and Plan: 1.  Multi pelvic fractures status post sacroiliac screw fixation secondary to trauma.              -Touchdown weightbearing right leg to facilitate transfers.              -Weightbearing as tolerated left leg for transfers only 8 weeks 2.  DVT Prophylaxis/Anticoagulation: SQ Lovenox.  -vascular  study normal 3. Pain Management: Neurontin 300 mg TID,Ultram 50-100 mg Q6 prn  -will increase fentanyl patch to 98mcg to assist activity tolerance  -continue hydrocodone   4. Acute blood loss anemia.Fergon 324 mg bid. Follow-up CBC 5. Neuropsych: This patient is capable of making decisions on his own behalf. 6. Skin/Wound Care/Sacral wound: air mattress.  -hydrotherapy to begin today  -collagenase to wound daily  -nutrition (protein supps/low albumin) 7. Fluids/Electrolytes/Nutrition: encourage PO 8. Urethral trauma. Status post suprapubic catheter. Follow-up with changes as directed. Urine remains clear/yellow   9. Diabetes mellitus with peripheral neuropathy. Hemoglobin A1c 7.9. Levemir 15 units daily at bedtime, Glucophage 1000 mg twice a day, Glucotrol 10 mg before breakfast and 5 mg before supper.             - Check blood sugars before meals and at bedtime             -good control 10. Hypertension. Norvasc 7.5 mg daily. Monitor with increased mobility 11. Hyperlipidemia. Lipitor 12. Constipation. Laxative assist   LOS (Days) 5 A FACE TO FACE EVALUATION WAS PERFORMED  Demaris Bousquet T 01/22/2016 8:39 AM

## 2016-01-23 ENCOUNTER — Inpatient Hospital Stay (HOSPITAL_COMMUNITY): Payer: Medicare Other | Admitting: Physical Therapy

## 2016-01-23 ENCOUNTER — Inpatient Hospital Stay (HOSPITAL_COMMUNITY): Payer: Medicare Other

## 2016-01-23 ENCOUNTER — Inpatient Hospital Stay (HOSPITAL_COMMUNITY): Payer: Medicare Other | Admitting: Occupational Therapy

## 2016-01-23 LAB — GLUCOSE, CAPILLARY
GLUCOSE-CAPILLARY: 99 mg/dL (ref 65–99)
GLUCOSE-CAPILLARY: 142 mg/dL — AB (ref 65–99)
GLUCOSE-CAPILLARY: 82 mg/dL (ref 65–99)
GLUCOSE-CAPILLARY: 64 mg/dL — AB (ref 65–99)
Glucose-Capillary: 65 mg/dL (ref 65–99)
Glucose-Capillary: 70 mg/dL (ref 65–99)

## 2016-01-23 MED ORDER — OXYCODONE HCL 5 MG PO TABS
10.0000 mg | ORAL_TABLET | Freq: Two times a day (BID) | ORAL | Status: DC
  Administered 2016-01-23 – 2016-02-12 (×37): 10 mg via ORAL
  Filled 2016-01-23 (×36): qty 2

## 2016-01-23 MED ORDER — OXYCODONE HCL 5 MG PO TABS
10.0000 mg | ORAL_TABLET | Freq: Four times a day (QID) | ORAL | Status: DC | PRN
  Administered 2016-01-23 – 2016-02-11 (×26): 10 mg via ORAL
  Filled 2016-01-23 (×29): qty 2

## 2016-01-23 NOTE — Progress Notes (Signed)
Physical Therapy Wound Treatment Patient Details  Name: Christian [name] MRN: [id number] Date of Birth: December 16, 1940  Today's Date: 01/23/2016 PT Individual Time: 0851-0920 PT Individual Time Calculation (min): 29 min    Subjective Subjective: Pt pleasant and agreeable to therapy Patient and Family Stated Goals: Heal wound Date of Onset:  (Unknown)  Pain Score: Pt reports increased pain during session. RN notified and pt received pain medication prior to session beginning. Pain limited time tolerable in sidelying.  Wound Assessment  Pressure Injury 01/17/16 Unstageable - Full thickness tissue loss in which the base of the ulcer is covered by slough (yellow, tan, gray, green or brown) and/or eschar (tan, brown or black) in the wound bed. purple discolored oval area reportedly from cr (Active)  Dressing Type ABD;Barrier Film (skin prep);Gauze (Comment);Moist to dry 01/23/2016  9:00 AM  Dressing Changed;Clean;Dry;Intact 01/23/2016  9:00 AM  Dressing Change Frequency Daily 01/23/2016  9:00 AM  State of Healing Eschar 01/23/2016  9:00 AM  Site / Wound Assessment Black;Brown;Yellow;Pink;Bleeding 01/23/2016  9:00 AM  % Wound base Red or Granulating 10% 01/23/2016  9:00 AM  % Wound base Yellow 65% 01/23/2016  9:00 AM  % Wound base Black 15% 01/23/2016  9:00 AM  % Wound base Other (Comment) 10% 01/23/2016  9:00 AM  Peri-wound Assessment Intact;Erythema (blanchable) 01/23/2016  9:00 AM  Wound Length (cm) 7.9 cm 01/21/2016  8:00 AM  Wound Width (cm) 5 cm 01/21/2016  8:00 AM  Wound Depth (cm) 0.1 cm 01/21/2016  8:00 AM  Margins Unattached edges (unapproximated) 01/23/2016  9:00 AM  Drainage Amount Moderate 01/23/2016  9:00 AM  Drainage Description Purulent;Odor 01/23/2016  9:00 AM  Treatment Debridement (Selective);Hydrotherapy (Pulse lavage);Packing (Saline gauze) 01/23/2016  9:00 AM   Santyl applied to wound bed prior to applying dressing.     Hydrotherapy Pulsed lavage therapy - wound  location: Sacrum Pulsed Lavage with Suction (psi): 12 psi Pulsed Lavage with Suction - Normal Saline Used: 1000 mL Pulsed Lavage Tip: Tip with splash shield Selective Debridement Selective Debridement - Location: Sacrum Selective Debridement - Tools Used: Forceps;Scalpel Selective Debridement - Tissue Removed: Yellow and brown/black necrotic tissue   Wound Assessment and Plan  Wound Therapy - Assess/Plan/Recommendations Wound Therapy - Clinical Statement: Pt will benefit from continued hydrotherapy for selective removal of nonviable tissue, and to promote wound bed healing.  Wound Therapy - Functional Problem List: Decreased tolerance for OOB sitting in chair Factors Delaying/Impairing Wound Healing: Diabetes Mellitus;Immobility Hydrotherapy Plan: Debridement;Dressing change;Patient/family education;Pulsatile lavage with suction Wound Therapy - Frequency: 6X / week Wound Therapy - Follow Up Recommendations: Home health RN Wound Plan: See above  Wound Therapy Goals- Improve the function of patient's integumentary system by progressing the wound(s) through the phases of wound healing (inflammation - proliferation - remodeling) by: Decrease Necrotic Tissue to: 25% Decrease Necrotic Tissue - Progress: Progressing toward goal Increase Granulation Tissue to: 75% Increase Granulation Tissue - Progress: Progressing toward goal Goals/treatment plan/discharge plan were made with and agreed upon by patient/family: Yes Time For Goal Achievement: 7 days Wound Therapy - Potential for Goals: Excellent  Goals will be updated until maximal potential achieved or discharge criteria met.  Discharge criteria: when goals achieved, discharge from hospital, MD decision/surgical intervention, no progress towards goals, refusal/missing three consecutive treatments without notification or medical reason.  GP     Rolinda Roan 01/23/2016, 9:51 AM   Rolinda Roan, PT, DPT Acute Rehabilitation  Services Pager: [phone]

## 2016-01-23 NOTE — Progress Notes (Signed)
Physical Therapy Session Note  Patient Details  Name: Christian [name] MRN: [id number] Date of Birth: 1940/06/26  Today's Date: 01/23/2016 PT Individual Time: 1300-1430 PT Individual Time Calculation (min): 90 min    Short Term Goals: Week 1:  PT Short Term Goal 1 (Week 1): patient will perform bed mobility with mod A from PT.  PT Short Term Goal 2 (Week 1): Patient will perform stand pivot/squat pivot with mod A.  PT Short Term Goal 3 (Week 1): Patient will performed WC mobility for 16ft with Supervision.  PT Short Term Goal 4 (Week 1): Patient will be able to participate in OOB activity for up to 2 hours.  Skilled Therapeutic Interventions/Progress Updates:   Pt received supine in bed, c/o pain in R hip as below and agreeable to treatment. Transfer bed>w/c with +2 maxA A/P transfer with several short rest breaks to reduce pt anxiety, cues for deep breathing to reduce valsalva. Seated in w/c x25 min with towel roll under RUE and pillow under R lower leg to reduce weight bearing through R pelvis. Pt attempted to eat lunch while seated in chair, however frequently reaching for arm rests to attempt unweighing R hip. Pt eventually stopped attempting to eat, and sat statically for remainder of time out of chair to improve tolerance. Returned to bed with +2A A/P transfer as above and significant increase in pain during mobility. Supine LE strengthening exercises including quad sets, heel slides, glute sets, short arc quads; RLE AAROM for heel sides and hip abduction/adduction. HOB elevated to 45 degrees with pt tolerating >5 min before requiring reclining rest break. Educated pt on frequent upright seated posture to improve tolerance to sitting in w/c and reduce sensitivity in pelvis to weight bearing. Pt remained supine in bed at end of session, all needs in reach.     Therapy Documentation Precautions:  Precautions Precautions: Fall Restrictions Weight Bearing Restrictions: Yes RLE Weight Bearing:  Non weight bearing LLE Weight Bearing: Weight bearing as tolerated Other Position/Activity Restrictions: LLE WBAT for transfers only Pain: Pain Assessment Pain Assessment: 0-10 Pain Score: 4  Pain Type: Acute pain;Surgical pain Pain Location: Hip Pain Orientation: Right Pain Descriptors / Indicators: Aching;Stabbing Pain Frequency: Constant Pain Onset: On-going Patients Stated Pain Goal: 4 Pain Intervention(s): Medication (See eMAR)   See Function Navigator for Current Functional Status.   Therapy/Group: Individual Therapy  [name] 01/23/2016, 2:06 PM

## 2016-01-23 NOTE — Plan of Care (Signed)
Problem: RH PAIN MANAGEMENT Goal: RH STG PAIN MANAGED AT OR BELOW PT'S PAIN GOAL 4 or less  Outcome: Progressing Pt requiring meds alternating Ultram and Norco to help prevent / maintain pain at adequate level.

## 2016-01-23 NOTE — Progress Notes (Signed)
PHYSICAL MEDICINE & REHABILITATION     PROGRESS NOTE    Subjective/Complaints: Still having a lot of pain. Makes it difficult even to perform simple transfers.   ROS: Pt denies fever, rash/itching, headache, blurred or double vision, nausea, vomiting, abdominal pain,  chest pain, shortness of breath, palpitations, dysuria, dizziness,   bleeding, anxiety, or depression   Objective: Vital Signs: Blood pressure 127/60, pulse 83, temperature 98 F (36.7 C), temperature source Oral, resp. rate 18, height 6\' 2"  (1.88 m), weight 104.8 kg (231 lb 1.6 oz), SpO2 95 %. No results found. No results for input(s): WBC, HGB, HCT, PLT in the last 72 hours. No results for input(s): NA, K, CL, GLUCOSE, BUN, CREATININE, CALCIUM in the last 72 hours.  Invalid input(s): CO CBG (last 3)   Recent Labs  01/22/16 1640 01/22/16 2038 01/23/16 0641  GLUCAP 106* 95 99    Wt Readings from Last 3 Encounters:  01/22/16 104.8 kg (231 lb 1.6 oz)  01/10/16 100.7 kg (222 lb)    Physical Exam:  Constitutional: He is oriented to person, place, and time. He appears well-developed.  HENT:  Head: Normocephalicand atraumatic.  Right Ear: External earnormal.  Left Ear: External earnormal.  Eyes: Conjunctivaeand EOMare normal. Pupils are equal, round, and reactive to light.  Neck: No tracheal deviationpresent. No thyromegalypresent.  Cardiovascular: Normal rate, regular rhythmand normal heart sounds.  Respiratory: Effort normaland breath sounds normal. No respiratory distress. He has no wheezes. He has no rales.  GI: Soft. Bowel sounds are normal. He exhibits no distension. There is no tenderness. There is no rebound.  Genitourinary: No penile tenderness. SPC in place Musculoskeletal: He exhibits edema. He exhibits no deformity.  Right leg tender to basic palpation and PROM at hip and knee.  Neurological: He is alertand oriented to person, place, and time.  UE 5/5 prox to distal. RLE:  1/5 HF, KE 3/5 ADF/PF. LLE: 3- HF, 3 KE and 4/5 ADF/PF. ?decreased LT bilateral distal lower extremities. Cognition appropriate.  Skin: Skin is warm.  Sacral wound with eschar/necrotic debris measuring about 3x5". Operative incision sites clean. Suprapubic catheter site clean and intact Psychiatric: He has a normal mood and affect. His behavior is normal. Judgmentand thought contentnormal  Assessment/Plan: 1. Functional and mobility deficits secondary to polytrauma which require 3+ hours per day of interdisciplinary therapy in a comprehensive inpatient rehab setting. Physiatrist is providing close team supervision and 24 hour management of active medical problems listed below. Physiatrist and rehab team continue to assess barriers to discharge/monitor patient progress toward functional and medical goals.  Function:  Bathing Bathing position   Position: Bed  Bathing parts Body parts bathed by patient: Right arm, Left arm, Chest, Abdomen, Right upper leg, Left upper leg, Front perineal area Body parts bathed by helper: Buttocks, Right lower leg, Left lower leg, Back  Bathing assist Assist Level:  (Max Assist)      Upper Body Dressing/Undressing Upper body dressing   What is the patient wearing?: Pull over shirt/dress     Pull over shirt/dress - Perfomed by patient: Thread/unthread right sleeve, Thread/unthread left sleeve, Put head through opening Pull over shirt/dress - Perfomed by helper: Pull shirt over trunk        Upper body assist Assist Level: Touching or steadying assistance(Pt > 75%)      Lower Body Dressing/Undressing Lower body dressing   What is the patient wearing?: [employer], Pants, Shoes   Underwear - Performed by helper: Thread/unthread right underwear leg, Thread/unthread  left underwear leg, Pull underwear up/down   Pants- Performed by helper: Thread/unthread right pants leg, Thread/unthread left pants leg, Pull pants up/down   Non-skid slipper socks-  Performed by helper: Don/doff right sock, Don/doff left sock       Shoes - Performed by helper: Don/doff right shoe, Don/doff left shoe, Fasten right, Fasten left       TED Hose - Performed by helper: Don/doff right TED hose, Don/doff left TED hose  Lower body assist Assist for lower body dressing: 2 Helpers      Toileting Toileting     Toileting steps completed by helper: Adjust clothing prior to toileting, Performs perineal hygiene, Adjust clothing after toileting Toileting Assistive Devices: Grab bar or rail  Toileting assist Assist level: Two helpers (Bed level)   Transfers Chair/bed transfer Chair/bed transfer activity did not occur: Safety/medical concerns Chair/bed transfer method: Anterior/posterior Chair/bed transfer assist level: 2 helpers Chair/bed transfer assistive device: Armrests     Locomotion Ambulation Ambulation activity did not occur: Safety/medical Editor, commissioning activity did not occur: Safety/medical concerns     Assist Level:  (unable to tolerate sitting up or transfer to wc)  Cognition Comprehension Comprehension assist level: Follows complex conversation/direction with no assist  Expression Expression assist level: Expresses complex ideas: With no assist  Social Interaction Social Interaction assist level: Interacts appropriately with others - No medications needed.  Problem Solving Problem solving assist level: Solves complex problems: Recognizes & self-corrects  Memory Memory assist level: Complete Independence: No helper   Medical Problem List and Plan: 1.  Multi pelvic fractures status post sacroiliac screw fixation secondary to trauma.              -Touchdown weightbearing right leg to facilitate transfers.              -Weightbearing as tolerated left leg for transfers only 8 weeks 2.  DVT Prophylaxis/Anticoagulation: SQ Lovenox.  -vascular study normal 3. Pain Management: Neurontin 300 mg TID,Ultram 50-100 mg Q6  prn  -increased fentanyl patch to 45mcg to assist activity tolerance  -change hydrocodone to oxycodone/ with two scheduled doses  -dc ultram 4. Acute blood loss anemia.Fergon 324 mg bid. Follow-up CBC 5. Neuropsych: This patient is capable of making decisions on his own behalf. 6. Skin/Wound Care/Sacral wound: air mattress.  -hydrotherapy to begin today  -collagenase to wound daily  -nutrition (protein supps/low albumin) 7. Fluids/Electrolytes/Nutrition: encourage PO 8. Urethral trauma. Status post suprapubic catheter. Follow-up with changes as directed. Urine remains clear/yellow   9. Diabetes mellitus with peripheral neuropathy. Hemoglobin A1c 7.9. Levemir 15 units daily at bedtime, Glucophage 1000 mg twice a day, Glucotrol 10 mg before breakfast and 5 mg before supper.             -good control 10. Hypertension. Norvasc 7.5 mg daily. Monitor with increased mobility 11. Hyperlipidemia. Lipitor 12. Constipation. Laxative assist   LOS (Days) 6 A FACE TO FACE EVALUATION WAS PERFORMED  SWARTZ,ZACHARY T 01/23/2016 9:55 AM

## 2016-01-23 NOTE — Progress Notes (Signed)
Occupational Therapy Session Note  Patient Details  Name: Christian [name] MRN: [id number] Date of Birth: July 26, 1940  Today's Date: 01/23/2016 OT Individual Time: 1000-1055 OT Individual Time Calculation (min): 55 min     Short Term Goals:Week 1:  OT Short Term Goal 1 (Week 1): Patient will complete upper body bathind and dressing at sink with min assist at w/c level. OT Short Term Goal 2 (Week 1): Patient will complete SPT from w/c to toilet with mod assist OT Short Term Goal 3 (Week 1): Pt will perform bed mobility using DME/AE, prn, with mod assist to manage BLE OT Short Term Goal 4 (Week 1): Patient will don pants with max assist to stand supported to pull up pants. OT Short Term Goal 5 (Week 1): Patient will complete lower body bathing at bed level with mod assist to wash buttocks and lower legs using AE, prn.  Skilled Therapeutic Interventions/Progress Updates:    Pt seen for OT session focusing on functional transfers/ mobility. Co-tx with therapy team  Supervisor to assist with problem solving pt mobility. Pt supine upon arrival, voicing 4/10 pain in hip, willing to attempt transfer OOB. Shorts donned in supine, pt's wife assisting. Pt attempting to bridge to have wife pull up pants. Reviewed NWB/ PWB restrictions and need to roll in order to complete clothing management in order to maintain precautions.  Completed A/P transfer with total A +2. Pt required to be partially reclined through entire transfer due to increased pain with hip flexion. Pt tolerated ~15-20 minutes reclined in w/c. Pt with new w/c air cushion provided for comfort and to assist with weight shift off R hip. LEs remained in dependent position supported on bed. AAROM provided for R hip AB/ADduction, able to get more range with each rep.  Pt began to get pale, wet wash cloth and drink provided prior to return to bed. +2 transfer completed back to bed. BP 152/62 in supine HR 88. Pt left in supine at end of session, family/  friends present. Educated throughout session regarding benefits of mobility/ OOB and importance of directing care.   Therapy Documentation Precautions:  Precautions Precautions: Fall Restrictions Weight Bearing Restrictions: Yes RLE Weight Bearing: Non weight bearing LLE Weight Bearing: Weight bearing as tolerated Other Position/Activity Restrictions: LLE WBAT for transfers only ADL: ADL ADL Comments: see Functional Assessment  See Function Navigator for Current Functional Status.   Therapy/Group: Individual Therapy  [name], [name] 01/23/2016, 6:44 AM

## 2016-01-23 NOTE — Progress Notes (Signed)
Physical Therapy Session Note  Patient Details  Name: Christian [name] MRN: [id number] Date of Birth: 1940-04-29  Today's Date: 01/23/2016 PT Individual Time: 1134-1202 PT Individual Time Calculation (min): 28 min    Short Term Goals: Week 1:  PT Short Term Goal 1 (Week 1): patient will perform bed mobility with mod A from PT.  PT Short Term Goal 2 (Week 1): Patient will perform stand pivot/squat pivot with mod A.  PT Short Term Goal 3 (Week 1): Patient will performed WC mobility for 183ft with Supervision.  PT Short Term Goal 4 (Week 1): Patient will be able to participate in OOB activity for up to 2 hours.  Skilled Therapeutic Interventions/Progress Updates:    Session focused on d/c planning discussion and supine LE therex to address strength and ROM to improve overall mobility. Pt instructed in SAQ, hip abduction/adduction, heel slides, and glute/quad sets x 15 reps each BLE (2# ankle weight to LLE and AAROM on RLE).   Therapy Documentation Precautions:  Precautions Precautions: Fall Restrictions Weight Bearing Restrictions: Yes RLE Weight Bearing: Non weight bearing LLE Weight Bearing: Weight bearing as tolerated Other Position/Activity Restrictions: LLE WBAT for transfers only  Pain: Premedicated for pain in pelvis and RLE.   See Function Navigator for Current Functional Status.   Therapy/Group: Individual Therapy  [name], PT, DPT  01/23/2016, 12:05 PM

## 2016-01-24 ENCOUNTER — Inpatient Hospital Stay (HOSPITAL_COMMUNITY): Payer: Medicare Other | Admitting: Occupational Therapy

## 2016-01-24 ENCOUNTER — Inpatient Hospital Stay (HOSPITAL_COMMUNITY): Payer: Medicare Other | Admitting: Physical Therapy

## 2016-01-24 LAB — GLUCOSE, CAPILLARY
GLUCOSE-CAPILLARY: 120 mg/dL — AB (ref 65–99)
GLUCOSE-CAPILLARY: 125 mg/dL — AB (ref 65–99)
GLUCOSE-CAPILLARY: 196 mg/dL — AB (ref 65–99)
Glucose-Capillary: 222 mg/dL — ABNORMAL HIGH (ref 65–99)
Glucose-Capillary: 161 mg/dL — ABNORMAL HIGH (ref 65–99)

## 2016-01-24 NOTE — Progress Notes (Signed)
Physical Therapy Wound Treatment Patient Details  Name: Christian [name] MRN: [id number] Date of Birth: 08-18-1940  Today's Date: 01/24/2016 PT Individual Time: 0827-0858 PT Individual Time Calculation (min): 31 min    Subjective Subjective: Pt pleasant and agreeable to therapy Patient and Family Stated Goals: Heal wound Date of Onset:  (Unknown)  Pain Score:  Pt with increased pain in sidelying position and treatment time was limited by pt's pain. Pt was premedicated.   Wound Assessment  Pressure Injury 01/17/16 Unstageable - Full thickness tissue loss in which the base of the ulcer is covered by slough (yellow, tan, gray, green or brown) and/or eschar (tan, brown or black) in the wound bed. purple discolored oval area reportedly from cr (Active)  Dressing Type ABD;Barrier Film (skin prep);Gauze (Comment);Moist to dry 01/24/2016  9:24 AM  Dressing Changed;Clean;Dry;Intact 01/24/2016  9:24 AM  Dressing Change Frequency Daily 01/24/2016  9:24 AM  State of Healing Eschar 01/24/2016  9:24 AM  Site / Wound Assessment Black;Brown;Yellow;Pink;Bleeding 01/24/2016  9:24 AM  % Wound base Red or Granulating 10% 01/24/2016  9:24 AM  % Wound base Yellow 65% 01/24/2016  9:24 AM  % Wound base Black 10% 01/24/2016  9:24 AM  % Wound base Other (Comment) 15% 01/24/2016  9:24 AM  Peri-wound Assessment Intact;Erythema (blanchable) 01/24/2016  9:24 AM  Wound Length (cm) 7.9 cm 01/21/2016  8:00 AM  Wound Width (cm) 5 cm 01/21/2016  8:00 AM  Wound Depth (cm) 0.1 cm 01/21/2016  8:00 AM  Margins Unattached edges (unapproximated) 01/24/2016  9:24 AM  Drainage Amount Moderate 01/24/2016  9:24 AM  Drainage Description Purulent;Odor 01/24/2016  9:24 AM  Treatment Debridement (Selective);Hydrotherapy (Pulse lavage);Packing (Saline gauze) 01/24/2016  9:24 AM  Santyl applied to wound bed prior to applying dressing.   Hydrotherapy Pulsed lavage therapy - wound location: Sacrum Pulsed Lavage with Suction (psi): 12  psi Pulsed Lavage with Suction - Normal Saline Used: 1000 mL Pulsed Lavage Tip: Tip with splash shield Selective Debridement Selective Debridement - Location: Sacrum Selective Debridement - Tools Used: Forceps;Scalpel Selective Debridement - Tissue Removed: Yellow and brown/black necrotic tissue   Wound Assessment and Plan  Wound Therapy - Assess/Plan/Recommendations Wound Therapy - Clinical Statement: Pt will benefit from continued hydrotherapy for selective removal of nonviable tissue, and to promote wound bed healing.  Wound Therapy - Functional Problem List: Decreased tolerance for OOB sitting in chair Factors Delaying/Impairing Wound Healing: Diabetes Mellitus;Immobility Hydrotherapy Plan: Debridement;Dressing change;Patient/family education;Pulsatile lavage with suction Wound Therapy - Frequency: 6X / week Wound Therapy - Follow Up Recommendations: Home health RN Wound Plan: See above  Wound Therapy Goals- Improve the function of patient's integumentary system by progressing the wound(s) through the phases of wound healing (inflammation - proliferation - remodeling) by: Decrease Necrotic Tissue to: 25% Decrease Necrotic Tissue - Progress: Progressing toward goal Increase Granulation Tissue to: 75% Increase Granulation Tissue - Progress: Progressing toward goal Goals/treatment plan/discharge plan were made with and agreed upon by patient/family: Yes Time For Goal Achievement: 7 days Wound Therapy - Potential for Goals: Excellent  Goals will be updated until maximal potential achieved or discharge criteria met.  Discharge criteria: when goals achieved, discharge from hospital, MD decision/surgical intervention, no progress towards goals, refusal/missing three consecutive treatments without notification or medical reason.  GP     Rolinda Roan 01/24/2016, 9:33 AM   Rolinda Roan, PT, DPT Acute Rehabilitation Services Pager: [phone]

## 2016-01-24 NOTE — Progress Notes (Signed)
Hypoglycemic Event  CBG:65 Treatment: 15 GM carbohydrate snack  Symptoms: None  Follow-up CBG: Time:**2132  CBG Result:70  Possible Reasons for Event: Unknown  Comments/MD notified:2135 call to Allyson Sabal, see orders    Etheleen Nicks

## 2016-01-24 NOTE — Progress Notes (Signed)
Physical Therapy Weekly Progress Note  Patient Details  Name: Christian [name] MRN: [id number] Date of Birth: 12-12-1940  Beginning of progress report period: January 18, 2016 End of progress report period: January 24, 2016  Today's Date: 01/24/2016 PT Individual Time: 1335-1430 PT Individual Time Calculation (min): 55 min    Patient has met 0 of 4 short term goals.  Patient is significantly limited in participation with therapy due to pain in pelvis. Extensive education ongoing with pt and family regarding slow healing process, and importance of continuing to perform mobility to reduce sensitivity and improve tolerance to upright. Patient currently requires maxA +2 for A/P transfers bed <>w/c, and tolerates sitting in w/c <10 minutes before returned back to bed due to pain and inability to find a position of relief.   Patient continues to demonstrate the following deficits: impaired activity tolerance, balance, postural control, ability to compensate for deficits and functional use of  right lower extremity and left lower extremity and therefore will continue to benefit from skilled PT intervention to enhance overall performance with  Patient progressing slowly towards long term goals..  Continue plan of care.  PT Short Term Goals Week 1:  PT Short Term Goal 1 (Week 1): patient will perform bed mobility with mod A from PT.  PT Short Term Goal 1 - Progress (Week 1): Not met PT Short Term Goal 2 (Week 1): Patient will perform stand pivot/squat pivot with mod A.  PT Short Term Goal 2 - Progress (Week 1): Not met PT Short Term Goal 3 (Week 1): Patient will performed WC mobility for 134f with Supervision.  PT Short Term Goal 3 - Progress (Week 1): Not met PT Short Term Goal 4 (Week 1): Patient will be able to participate in OOB activity for up to 2 hours. PT Short Term Goal 4 - Progress (Week 1): Not met Week 2:  PT Short Term Goal 1 (Week 2): Pt will tolerate sitting up in w/c >30 min PT Short  Term Goal 2 (Week 2): Pt will perform A/P transfer with modA +2 PT Short Term Goal 3 (Week 2): Pt will perform rolling R/L with minA PT Short Term Goal 4 (Week 2): Pt will propel w/c x50' with BUE and modA   Skilled Therapeutic Interventions/Progress Updates:   Pt received supine in bed, c/o 8/10 pain in pelvis as below and agreeable to treatment. Pt removed shirt/shorts, and donned clean shirt/shorts with minA overall, use of grabber for managing shorts over feet, and rolling L and bridging to pull pants over hips Instructed pt in plan to transition supine>long sit and perform posterior transfer into w/c. Once assisted into sitting off reclined bed, pt with increase in pain and demanding RLE be pulled forward, bringing pt to opposite side of the bed as intended. While pt rested EOB min guard, educated in anterior weight shift and trunk flexion to keep COM over hips and improve ability to transition backwards into chair. Performed posterior scoot into chair across length of bed to sit in w/c. Sat several min with BLE elevated off EOB; several positioning adjustments made to attempt to alleviate pain however unsuccessful. LEs removed from bed and placed on leg rests; unable to tolerate despite several adjustments, towel roll placement and assist with shifting weight onto L hip. Returned to bed anterior transfer and dependent spin to supine. Pt able to bridge with LLE to reposition in bed and use of UEs on rails for scooting toward HOB. Remained supine in bed at  end of session, all needs in reach and family present.   Therapy Documentation Precautions:  Precautions Precautions: Fall Restrictions Weight Bearing Restrictions: Yes RLE Weight Bearing: Non weight bearing LLE Weight Bearing: Weight bearing as tolerated Other Position/Activity Restrictions: LLE WBAT for transfers only Pain: Pain Assessment Pain Assessment: 0-10 Pain Score: 8  Pain Type: Acute pain Pain Location: Pelvis Pain Orientation:  Right;Mid Pain Descriptors / Indicators: Shooting;Stabbing Pain Onset: With Activity Patients Stated Pain Goal: 4 Pain Intervention(s): Other (Comment);Repositioned (pre-medicated)   See Function Navigator for Current Functional Status.  Therapy/Group: Individual Therapy  [name] 01/24/2016, 2:30 PM

## 2016-01-24 NOTE — Progress Notes (Signed)
[name] PHYSICAL MEDICINE & REHABILITATION     PROGRESS NOTE    Subjective/Complaints: Pain much improved with new regimen. Able to transfer and move much more easily.    ROS: Pt denies fever, rash/itching, headache, blurred or double vision, nausea, vomiting, abdominal pain,  chest pain, shortness of breath, palpitations, dysuria, dizziness,   bleeding, anxiety, or depression   Objective: Vital Signs: Blood pressure (!) 146/66, pulse 86, temperature 97.5 F (36.4 C), temperature source Oral, resp. rate 20, height 6\' 2"  (1.88 m), weight 104.8 kg (231 lb 1.6 oz), SpO2 92 %. No results found. No results for input(s): WBC, HGB, HCT, PLT in the last 72 hours. No results for input(s): NA, K, CL, GLUCOSE, BUN, CREATININE, CALCIUM in the last 72 hours.  Invalid input(s): CO CBG (last 3)   Recent Labs  01/23/16 2132 01/23/16 2357 01/24/16 0628  GLUCAP 70 125* 120*    Wt Readings from Last 3 Encounters:  01/22/16 104.8 kg (231 lb 1.6 oz)  01/10/16 100.7 kg (222 lb)    Physical Exam:  Constitutional: He is oriented to person, place, and time. He appears well-developed.  HENT:  Head: Normocephalicand atraumatic.  Right Ear: External earnormal.  Left Ear: External earnormal.  Eyes: Conjunctivaeand EOMare normal. Pupils are equal, round, and reactive to light.  Neck: No tracheal deviationpresent. No thyromegalypresent.  Cardiovascular: Normal rate, regular rhythmand normal heart sounds.  Respiratory: Effort normaland breath sounds normal. No respiratory distress. He has no wheezes. He has no rales.  GI: Soft. Bowel sounds are normal. He exhibits no distension. There is no tenderness. There is no rebound.  Genitourinary: No penile tenderness. SPC in place Musculoskeletal: He exhibits edema. He exhibits no deformity.  Right leg tender to basic palpation and PROM at hip and knee.  Neurological: He is alertand oriented to person, place, and time.  UE 5/5 prox to  distal. RLE: 1/5 HF, KE 3/5 ADF/PF. LLE: 3- HF, 3 KE and 4/5 ADF/PF. +/- LT bilateral distal lower extremities. Cognition appropriate.  Skin: Skin is warm.  Sacral wound with exposed pink granulation tissue. Less necrotic debris. Operative incision sites clean. Suprapubic catheter site clean with some irritation of skin where anchoring sutures located Psychiatric: He has a normal mood and affect. His behavior is normal. Judgmentand thought contentnormal  Assessment/Plan: 1. Functional and mobility deficits secondary to polytrauma which require 3+ hours per day of interdisciplinary therapy in a comprehensive inpatient rehab setting. Physiatrist is providing close team supervision and 24 hour management of active medical problems listed below. Physiatrist and rehab team continue to assess barriers to discharge/monitor patient progress toward functional and medical goals.  Function:  Bathing Bathing position   Position: Bed  Bathing parts Body parts bathed by patient: Right arm, Left arm, Chest, Abdomen, Right upper leg, Left upper leg, Front perineal area Body parts bathed by helper: Buttocks  Bathing assist Assist Level: 2 helpers      Upper Body Dressing/Undressing Upper body dressing   What is the patient wearing?: Pull over shirt/dress     Pull over shirt/dress - Perfomed by patient: Thread/unthread right sleeve, Thread/unthread left sleeve, Put head through opening Pull over shirt/dress - Perfomed by helper: Pull shirt over trunk        Upper body assist Assist Level: Touching or steadying assistance(Pt > 75%)      Lower Body Dressing/Undressing Lower body dressing   What is the patient wearing?: Pants, Non-skid slipper socks   Underwear - Performed by helper: Thread/unthread right  underwear leg, Thread/unthread left underwear leg, Pull underwear up/down   Pants- Performed by helper: Thread/unthread right pants leg, Thread/unthread left pants leg, Pull pants up/down    Non-skid slipper socks- Performed by helper: Don/doff right sock, Don/doff left sock       Shoes - Performed by helper: Don/doff right shoe, Don/doff left shoe, Fasten right, Fasten left       TED Hose - Performed by helper: Don/doff right TED hose, Don/doff left TED hose  Lower body assist Assist for lower body dressing: 2 Helpers      Toileting Toileting     Toileting steps completed by helper: Adjust clothing prior to toileting, Adjust clothing after toileting, Performs perineal hygiene Toileting Assistive Devices: Toilet aid  Toileting assist Assist level: More than reasonable time, Set up/obtain supplies, Two helpers, Supervision or verbal cues   Transfers Chair/bed transfer Chair/bed transfer activity did not occur: Safety/medical concerns Chair/bed transfer method: Anterior/posterior Chair/bed transfer assist level: 2 helpers Chair/bed transfer assistive device: Armrests     Locomotion Ambulation Ambulation activity did not occur: Safety/medical Editor, commissioning activity did not occur: Safety/medical concerns     Assist Level:  (unable to tolerate sitting up or transfer to wc)  Cognition Comprehension Comprehension assist level: Follows complex conversation/direction with no assist  Expression Expression assist level: Expresses complex ideas: With no assist  Social Interaction Social Interaction assist level: Interacts appropriately with others - No medications needed.  Problem Solving Problem solving assist level: Solves complex problems: Recognizes & self-corrects  Memory Memory assist level: Complete Independence: No helper   Medical Problem List and Plan: 1.  Multi pelvic fractures status post sacroiliac screw fixation secondary to trauma.              -Touchdown weightbearing right leg to facilitate transfers.              -Weightbearing as tolerated left leg for transfers only 8 weeks 2.  DVT Prophylaxis/Anticoagulation: SQ  Lovenox.  -vascular study normal 3. Pain Management: Neurontin 300 mg TID  -continue fentanyl patch at 9mcg   - oxycodone prn/ with two scheduled doses to coincide with therapy  -much improved pain levels today 4. Acute blood loss anemia.Fergon 324 mg bid. Follow-up CBC 5. Neuropsych: This patient is capable of making decisions on his own behalf. 6. Skin/Wound Care/Sacral wound: air mattress.  -hydrotherapy helping  -collagenase to wound daily  -nutrition (protein supps/low albumin) 7. Fluids/Electrolytes/Nutrition: encourage PO 8. Urethral trauma. Status post suprapubic catheter. Follow-up with changes as directed. Urine remains clear/yellow    -tube needs to be fixed more securely to skin to avoid irritation around suture sites 9. Diabetes mellitus with peripheral neuropathy. Hemoglobin A1c 7.9. Levemir 15 units daily at bedtime, Glucophage 1000 mg twice a day, Glucotrol 10 mg before breakfast and 5 mg before supper.             -good control 10. Hypertension. Norvasc 7.5 mg daily. Monitor with increased mobility 11. Hyperlipidemia. Lipitor 12. Constipation. Laxative assist   LOS (Days) 7 A FACE TO FACE EVALUATION WAS PERFORMED  Christian [name] T 01/24/2016 10:16 AM

## 2016-01-24 NOTE — Progress Notes (Signed)
Occupational Therapy Weekly Progress Note  Patient Details  Name: HAIK MAHONEY MRN: [id number] Date of Birth: Nov 17, 1940  Beginning of progress report period: January 18, 2016 End of progress report period: January 24, 2016  Today's Date: 01/24/2016 OT Individual Time: 1030-1200 OT Individual Time Calculation (min): 90 min    Patient has met 1 of 5 short term goals.  Pt making very slow progress towards OT goals. Pt is severely limited with all mobility due to pain. He has gotten into w/c 4 times throughout the past week, lasting at most 15 minutes while weightbearing through L hip and heavy reliance on UEs  Patient continues to demonstrate the following deficits: acute pain and muscle weakness (generalized) and therefore will continue to benefit from skilled OT intervention to enhance overall performance with BADL and Reduce care partner burden.  Patient progressing toward long term goals..  Continue plan of care. Goals remain appropriate at this time, however, if pain cont to be such a limiting factor with all mobility and tolerance of functional sitting positions may have to downgrade goals.   OT Short Term Goals Week 1:  OT Short Term Goal 1 (Week 1): Patient will complete upper body bathind and dressing at sink with min assist at w/c level. OT Short Term Goal 1 - Progress (Week 1): Not met OT Short Term Goal 2 (Week 1): Patient will complete SPT from w/c to toilet with mod assist OT Short Term Goal 2 - Progress (Week 1): Not met OT Short Term Goal 3 (Week 1): Pt will perform bed mobility using DME/AE, prn, with mod assist to manage BLE OT Short Term Goal 3 - Progress (Week 1): Not met OT Short Term Goal 4 (Week 1): Patient will don pants with max assist to stand supported to pull up pants. OT Short Term Goal 4 - Progress (Week 1): Not met OT Short Term Goal 5 (Week 1): Patient will complete lower body bathing at bed level with mod assist to wash buttocks and lower legs using AE,  prn. OT Short Term Goal 5 - Progress (Week 1): Met Week 2:  OT Short Term Goal 1 (Week 2): Pt will tolerate 2 hours OOB in order to increase upright tolerance and functional positioning OT Short Term Goal 2 (Week 2): Pt will compete UB dressing in seated position (w/c or EOB) with set-up/ supervision OT Short Term Goal 3 (Week 2): Pt will complete 3 grooming tasks seated in w/c with set-up OT Short Term Goal 4 (Week 2): Pt will transfer with assist of +1 in order to decrease caregiver burden   Skilled Therapeutic Interventions/Progress Updates:    Session One: Pt seen for OT ADL bathing/dressing session with emphasis on functional mobility, ROM, mobility, and UE strengthening. Pt in supine upon arrival, agreeable to tx session. He voiced pain 4/10 while at rest in supine. While in supine, AAROM provided to L LE for hip flexion as pt voiced increased stiffness in LE and wanting to "warm up" prior to mobility. Pt utilized leg lifter, reacher, and LH sponge to complete LB bathing task, see function tab for details.  With +2 assist and significantly increased time, pt transferred to EOB with max A x2. Pt tolerated seated EOB ~12 minutes with heavy reliance on UEs, strong L lean in order to take weight off R hip. Pt required overall max- total A for sitting balance with occasional supervision for ~5 seconds x3 trials.Pt voiced "room spinning" when sitting EOB, reported it subsided very minimally  during extended time up. He required +2 total A to return to supine. Pt with difficulty maintaining NWB/PWB, bridging into bed for clothing management and when in pain. While in supine, pt completed UE strengthening exercises using 4# weighted ball. Completed overhead press, chest press, diagonal up/down, and lateral rotations. Completed x10 reps with demonstration and tactile cues for proper form and technique.   Pt left in supine at end of session, all needs in reach. Spoke with pt's RN and have shifted pt's pain  medicine schedule to now correlate better with therapy time. Pt due for pain med at end of therapy session today. Have now shifted scheduled pain meds to 8 a.m and 1 p.m.  Session Two: Pt seen for OT session focusing on functional mobility and upright tolerance. Pt received in supine following PT session, family members present. Pt voiced increased pain and fatigue from previous mobility, however, with encouragement pt willing to participate. He transferred with +2 assist, increased time, and verbal and tactile cues for sequencing/ technique to transfer to EOB. Pt able to sit EOB with supervision, L lean, and heavy reliance on UEs to maintain upright position. Pillow placed under pt's R hip for comfort. Pt returned to supine at end of session, left with all needs in reach and family members present.  Pt and caregivers educated extensively regarding importance of upright posture, LE ROM exercises, and HEP.     Therapy Documentation Precautions:  Precautions Precautions: Fall Restrictions Weight Bearing Restrictions: Yes RLE Weight Bearing: Non weight bearing LLE Weight Bearing: Weight bearing as tolerated Other Position/Activity Restrictions: LLE WBAT for transfers only ADL: ADL ADL Comments: see Functional Assessment  See Function Navigator for Current Functional Status.   Therapy/Group: Individual Therapy  [name], Bitha Fauteux C 01/24/2016, 7:13 AM

## 2016-01-24 NOTE — Plan of Care (Signed)
Problem: RH PAIN MANAGEMENT Goal: RH STG PAIN MANAGED AT OR BELOW PT'S PAIN GOAL 4 or less  Outcome: Not Progressing Pt rates pain at 8.

## 2016-01-24 NOTE — Progress Notes (Addendum)
Writer discussed the use of lavender essential oil wraps to reduce scrotal swelling with this client. Patient agreed to the procedure.  Lavender oil was mixed with sterile water at a ratio of 18ml water to 15 gtts lavender oil. 4x4 gauze was soaked in this solution and wrapped around the patient's scrotum. After 10 minutes there was no apparent adverse reaction to the wraps. Patient was advised to remove the wrap if he experienced any discomfort such as a burning or itching sensation.   01/27/16-Patient stated that the lavender wrap did reduce his scrotal swelling significantly.

## 2016-01-24 NOTE — Progress Notes (Signed)
Per Danella Sensing, Np, recheck cbg at midnight, call if at or below 70.

## 2016-01-25 ENCOUNTER — Inpatient Hospital Stay (HOSPITAL_COMMUNITY): Payer: Medicare Other

## 2016-01-25 DIAGNOSIS — S3730XS Unspecified injury of urethra, sequela: Secondary | ICD-10-CM

## 2016-01-25 LAB — GLUCOSE, CAPILLARY
GLUCOSE-CAPILLARY: 164 mg/dL — AB (ref 65–99)
GLUCOSE-CAPILLARY: 95 mg/dL (ref 65–99)
GLUCOSE-CAPILLARY: 125 mg/dL — AB (ref 65–99)
Glucose-Capillary: 210 mg/dL — ABNORMAL HIGH (ref 65–99)

## 2016-01-25 NOTE — Progress Notes (Signed)
[place] PHYSICAL MEDICINE & REHABILITATION     PROGRESS NOTE    Subjective/Complaints: No pain c/os   ROS: Pt denies fever, rash/itching, headache, blurred or double vision, nausea, vomiting, abdominal pain,  chest pain, shortness of breath, palpitations, dysuria, dizziness,     Objective: Vital Signs: Blood pressure (!) 149/57, pulse 87, temperature 98.7 F (37.1 C), temperature source Oral, resp. rate 17, height 6\' 2"  (1.88 m), weight 104.8 kg (231 lb 1.6 oz), SpO2 95 %. No results found. No results for input(s): WBC, HGB, HCT, PLT in the last 72 hours. No results for input(s): NA, K, CL, GLUCOSE, BUN, CREATININE, CALCIUM in the last 72 hours.  Invalid input(s): CO CBG (last 3)   Recent Labs  01/24/16 1658 01/24/16 2033 01/25/16 0642  GLUCAP 222* 161* 164*    Wt Readings from Last 3 Encounters:  01/22/16 104.8 kg (231 lb 1.6 oz)  01/10/16 100.7 kg (222 lb)    Physical Exam:  Constitutional: He is oriented to person, place, and time. He appears well-developed.  HENT:  Head: Normocephalicand atraumatic.  Right Ear: External earnormal.  Left Ear: External earnormal.  Eyes: Conjunctivaeand EOMare normal. Pupils are equal, round, and reactive to light.  Neck: No tracheal deviationpresent. No thyromegalypresent.  Cardiovascular: Normal rate, regular rhythmand normal heart sounds.  Respiratory: Effort normaland breath sounds normal. No respiratory distress. He has no wheezes. He has no rales.  GI: Soft. Bowel sounds are normal. He exhibits no distension. There is no tenderness. There is no rebound.  Genitourinary: No penile tenderness. SPC in place Musculoskeletal: He exhibits edema. He exhibits no deformity.  Right leg tender to basic palpation and PROM at hip and knee.  Neurological: He is alertand oriented to person, place, and time.  UE 5/5 prox to distal. RLE: 1/5 HF, KE 3/5 ADF/PF. LLE: 3- HF, 3 KE and 4/5 ADF/PF. +/- LT bilateral distal lower  extremities. Cognition appropriate.  Skin: Skin is warm.  Sacral wound with exposed pink granulation tissue. Less necrotic debris. Operative incision sites clean. Suprapubic catheter site clean with some irritation of skin where anchoring sutures located Psychiatric: He has a normal mood and affect. His behavior is normal. Judgmentand thought contentnormal  Assessment/Plan: 1. Functional and mobility deficits secondary to polytrauma which require 3+ hours per day of interdisciplinary therapy in a comprehensive inpatient rehab setting. Physiatrist is providing close team supervision and 24 hour management of active medical problems listed below. Physiatrist and rehab team continue to assess barriers to discharge/monitor patient progress toward functional and medical goals.  Function:  Bathing Bathing position   Position: Bed  Bathing parts Body parts bathed by patient: Right upper leg, Left upper leg Body parts bathed by helper: Buttocks  Bathing assist Assist Level: 2 helpers      Upper Body Dressing/Undressing Upper body dressing   What is the patient wearing?: Pull over shirt/dress     Pull over shirt/dress - Perfomed by patient: Thread/unthread right sleeve, Thread/unthread left sleeve, Put head through opening Pull over shirt/dress - Perfomed by helper: Pull shirt over trunk        Upper body assist Assist Level: Touching or steadying assistance(Pt > 75%)      Lower Body Dressing/Undressing Lower body dressing   What is the patient wearing?: Pants   Underwear - Performed by helper: Thread/unthread right underwear leg, Thread/unthread left underwear leg, Pull underwear up/down Pants- Performed by patient: Thread/unthread left pants leg Pants- Performed by helper: Thread/unthread right pants leg, Pull pants up/down  Non-skid slipper socks- Performed by helper: Don/doff right sock, Don/doff left sock       Shoes - Performed by helper: Don/doff right shoe, Don/doff  left shoe, Fasten right, Fasten left       TED Hose - Performed by helper: Don/doff right TED hose, Don/doff left TED hose  Lower body assist Assist for lower body dressing: 2 Helpers      Toileting Toileting     Toileting steps completed by helper: Adjust clothing prior to toileting, Adjust clothing after toileting, Performs perineal hygiene Toileting Assistive Devices: Toilet aid  Toileting assist Assist level: More than reasonable time, Set up/obtain supplies, Two helpers, Supervision or verbal cues   Transfers Chair/bed transfer Chair/bed transfer activity did not occur: Safety/medical concerns Chair/bed transfer method: Anterior/posterior Chair/bed transfer assist level: 2 helpers Chair/bed transfer assistive device: Armrests     Locomotion Ambulation Ambulation activity did not occur: Safety/medical Editor, commissioning activity did not occur: Safety/medical concerns     Assist Level:  (unable to tolerate sitting up or transfer to wc)  Cognition Comprehension Comprehension assist level: Follows complex conversation/direction with no assist  Expression Expression assist level: Expresses complex ideas: With no assist  Social Interaction Social Interaction assist level: Interacts appropriately with others - No medications needed.  Problem Solving Problem solving assist level: Solves complex problems: Recognizes & self-corrects  Memory Memory assist level: Complete Independence: No helper   Medical Problem List and Plan: 1.  Multi pelvic fractures status post sacroiliac screw fixation secondary to trauma.              -Touchdown weightbearing right leg to facilitate transfers. CIR PT/OT             -Weightbearing as tolerated left leg for transfers only 8 weeks 2.  DVT Prophylaxis/Anticoagulation: SQ Lovenox.  -vascular study normal 3. Pain Management: Neurontin 300 mg TID  -continue fentanyl patch at 60mcg   - oxycodone prn/ with two scheduled  doses to coincide with therapy  -much improved pain levels today 4. Acute blood loss anemia.Fergon 324 mg bid. Follow-up CBC 5. Neuropsych: This patient is capable of making decisions on his own behalf. 6. Skin/Wound Care/Sacral wound: air mattress.  -hydrotherapy helping  -collagenase to wound daily  -nutrition (protein supps/low albumin) 7. Fluids/Electrolytes/Nutrition: encourage PO 8. Urethral trauma. Status post suprapubic catheter. Follow-up with changes as directed. Urine remains clear/yellow    -tube needs to be fixed more securely to skin to avoid irritation around suture sites 9. Diabetes mellitus with peripheral neuropathy. Hemoglobin A1c 7.9. Levemir 15 units daily at bedtime, Glucophage 1000 mg twice a day, Glucotrol 10 mg before breakfast and 5 mg before supper.             - CBG (last 3)   Recent Labs  01/24/16 1658 01/24/16 2033 01/25/16 0642  GLUCAP 222* 161* 164*     10. Hypertension. Norvasc 7.5 mg daily. Monitor with increased mobility, no change for now Vitals:   01/24/16 1304 01/25/16 0445  BP: (!) 127/54 (!) 149/57  Pulse: 86 87  Resp: 18 17  Temp: 97.9 F (36.6 C) 98.7 F (37.1 C)   11. Hyperlipidemia. Lipitor 12. Constipation. Laxative assist   LOS (Days) 8 A FACE TO FACE EVALUATION WAS PERFORMED  Charlett Blake 01/25/2016 7:47 AM

## 2016-01-25 NOTE — Progress Notes (Signed)
Physical Therapy Wound Treatment Patient Details  Name: Christian [name] MRN: [id number] Date of Birth: [birth year]  Today's Date: 01/25/2016 PT Individual Time: 0830-0901 PT Individual Time Calculation (min): 31 min    Subjective Subjective: Pt pleasant and agreeable to therapy Patient and Family Stated Goals: Heal wound Date of Onset:  (Unknown)  Pain Score:   3/10 pre-medicated for pain  Wound Assessment  Pressure Injury 01/17/16 Unstageable - Full thickness tissue loss in which the base of the ulcer is covered by slough (yellow, tan, gray, green or brown) and/or eschar (tan, brown or black) in the wound bed. purple discolored oval area reportedly from cr (Active)  Dressing Type ABD;Barrier Film (skin prep);Gauze (Comment);Moist to dry 01/25/2016 12:47 PM  Dressing Changed;Clean;Dry;Intact 01/25/2016 12:47 PM  Dressing Change Frequency Daily 01/25/2016 12:47 PM  State of Healing Eschar 01/25/2016 12:47 PM  Site / Wound Assessment Black;Brown;Yellow;Pink;Bleeding 01/25/2016 12:47 PM  % Wound base Red or Granulating 10% 01/25/2016 12:47 PM  % Wound base Yellow 65% 01/25/2016 12:47 PM  % Wound base Black 10% 01/25/2016 12:47 PM  % Wound base Other (Comment) 15% 01/25/2016 12:47 PM  Peri-wound Assessment Intact;Erythema (blanchable) 01/25/2016 12:47 PM  Wound Length (cm) 7.9 cm 01/21/2016  8:00 AM  Wound Width (cm) 5 cm 01/21/2016  8:00 AM  Wound Depth (cm) 0.1 cm 01/21/2016  8:00 AM  Margins Unattached edges (unapproximated) 01/25/2016 12:47 PM  Drainage Amount Moderate 01/25/2016 12:47 PM  Drainage Description Purulent;Odor;Green 01/25/2016 12:47 PM  Treatment Debridement (Selective);Hydrotherapy (Pulse lavage);Packing (Saline gauze) 01/25/2016 12:47 PM      Hydrotherapy Pulsed lavage therapy - wound location: Sacrum Pulsed Lavage with Suction (psi): 12 psi Pulsed Lavage with Suction - Normal Saline Used: 1000 mL Pulsed Lavage Tip: Tip with splash shield Selective  Debridement Selective Debridement - Location: Sacrum Selective Debridement - Tools Used: Forceps;Scalpel Selective Debridement - Tissue Removed: Yellow and brown/black necrotic tissue   Wound Assessment and Plan  Wound Therapy - Assess/Plan/Recommendations Wound Therapy - Clinical Statement: Pt will benefit from continued hydrotherapy for selective removal of nonviable tissue, and to promote wound bed healing.  Wound Therapy - Functional Problem List: Decreased tolerance for OOB sitting in chair Factors Delaying/Impairing Wound Healing: Diabetes Mellitus;Immobility Hydrotherapy Plan: Debridement;Dressing change;Patient/family education;Pulsatile lavage with suction Wound Therapy - Frequency: 6X / week Wound Therapy - Follow Up Recommendations: Home health RN Wound Plan: See above  Wound Therapy Goals- Improve the function of patient's integumentary system by progressing the wound(s) through the phases of wound healing (inflammation - proliferation - remodeling) by: Decrease Necrotic Tissue to: 25% Decrease Necrotic Tissue - Progress: Progressing toward goal Increase Granulation Tissue to: 75% Increase Granulation Tissue - Progress: Progressing toward goal  Goals will be updated until maximal potential achieved or discharge criteria met.  Discharge criteria: when goals achieved, discharge from hospital, MD decision/surgical intervention, no progress towards goals, refusal/missing three consecutive treatments without notification or medical reason.  GP     Shelanda Duvall 01/25/2016, 12:52 PM Pager [phone]

## 2016-01-25 NOTE — Progress Notes (Signed)
Physical Therapy Session Note  Patient Details  Name: Christian [name] MRN: [id number] Date of Birth: [birth year]  Today's Date: 01/25/2016 PT Individual Time: 0935-1020 PT Individual Time Calculation (min): 45 min   Short Term Goals: Week 2:  PT Short Term Goal 1 (Week 2): Pt will tolerate sitting up in w/c >30 min PT Short Term Goal 2 (Week 2): Pt will perform A/P transfer with modA +2 PT Short Term Goal 3 (Week 2): Pt will perform rolling R/L with minA PT Short Term Goal 4 (Week 2): Pt will propel w/c x50' with BUE and modA  Skilled Therapeutic Interventions/Progress Updates: neuromuscular re-education for RLE in supine for hip abduction/adduction, hip internal rotation, bil ankle pumps, bil heel slides.  +2 A-P transfer off of L side of bed into w/c.  Pt left resting with bil LEs elevated on bed and w/c back reclined.  He tolerated being OOB x 1 hour before calling to get back in bed; +2 to return to bed.  Pt left resting in bed all needs within reach, wife in room.      Therapy Documentation Precautions:  Precautions Precautions: Fall Restrictions Weight Bearing Restrictions: Yes RLE Weight Bearing: Non weight bearing LLE Weight Bearing: Weight bearing as tolerated Other Position/Activity Restrictions: LLE WBAT for transfers only   Pain: 8/10 pelvis; premedicated      See Function Navigator for Current Functional Status.   Therapy/Group: Individual Therapy  Kamelia Lampkins 01/25/2016, 12:40 PM

## 2016-01-26 ENCOUNTER — Inpatient Hospital Stay (HOSPITAL_COMMUNITY): Payer: Medicare Other | Admitting: Occupational Therapy

## 2016-01-26 DIAGNOSIS — S32810S Multiple fractures of pelvis with stable disruption of pelvic ring, sequela: Secondary | ICD-10-CM

## 2016-01-26 LAB — GLUCOSE, CAPILLARY
GLUCOSE-CAPILLARY: 111 mg/dL — AB (ref 65–99)
Glucose-Capillary: 128 mg/dL — ABNORMAL HIGH (ref 65–99)
Glucose-Capillary: 137 mg/dL — ABNORMAL HIGH (ref 65–99)
Glucose-Capillary: 215 mg/dL — ABNORMAL HIGH (ref 65–99)

## 2016-01-26 NOTE — Progress Notes (Signed)
Occupational Therapy Session Note  Patient Details  Name: Christian [name] MRN: [id number] Date of Birth: 11-26-1940  Today's Date: 01/26/2016 OT Individual Time:  - 6599-3570  (92 min)  Short Term Goals: Week 1:  OT Short Term Goal 1 (Week 1): Patient will complete upper body bathind and dressing at sink with min assist at w/c level. OT Short Term Goal 1 - Progress (Week 1): Not met OT Short Term Goal 2 (Week 1): Patient will complete SPT from w/c to toilet with mod assist OT Short Term Goal 2 - Progress (Week 1): Not met OT Short Term Goal 3 (Week 1): Pt will perform bed mobility using DME/AE, prn, with mod assist to manage BLE OT Short Term Goal 3 - Progress (Week 1): Not met OT Short Term Goal 4 (Week 1): Patient will don pants with max assist to stand supported to pull up pants. OT Short Term Goal 4 - Progress (Week 1): Not met OT Short Term Goal 5 (Week 1): Patient will complete lower body bathing at bed level with mod assist to wash buttocks and lower legs using AE, prn. OT Short Term Goal 5 - Progress (Week 1): Met Week 2:  OT Short Term Goal 1 (Week 2): Pt will tolerate 2 hours OOB in order to increase upright tolerance and functional positioning OT Short Term Goal 2 (Week 2): Pt will compete UB dressing in seated position (w/c or EOB) with set-up/ supervision OT Short Term Goal 3 (Week 2): Pt will complete 3 grooming tasks seated in w/c with set-up OT Short Term Goal 4 (Week 2): Pt will transfer with assist of +1 in order to decrease caregiver burden  Skilled Therapeutic Interventions/Progress Updates:    Treatment plan focuses on  mobility, ROM, and LE AAROM  Pt in supine upon arrival, agreeable to tx session. He voiced pain 4/10 while at rest in supine.  Treatment plan was changed from getting to EOB to exercises in supine due to pt requests that he felt so stiff.  While in supine, AAROM provided to Both LE for hip flexion/extension; knee flexion/extension/ ankle flexion/extension  and toes flexion and extension for 2 sets of 10.  Marland Kitchen  Ppt stated his right leg gets stiff and  uncomfortable. RLE stronger than LLE due to nature of injury.   BP=  163/69; HR= 82.     Pt left in supine at end of session, all needs in reach. Therapy Documentation Precautions:  Precautions Precautions: Fall Restrictions Weight Bearing Restrictions: Yes RLE Weight Bearing: Non weight bearing LLE Weight Bearing: Weight bearing as tolerated Other Position/Activity Restrictions: LLE WBAT for transfers only General:   Vital Signs: Therapy Vitals BP: (!) 149/55 Pain:  5/10 right LE   ADL: ADL ADL Comments: see Functional Assessment     See Function Navigator for Current Functional Status.   Therapy/Group: Individual Therapy  Lisa Roca 01/26/2016, 12:50 PM

## 2016-01-26 NOTE — Progress Notes (Signed)
[facility] PHYSICAL MEDICINE & REHABILITATION     PROGRESS NOTE    Subjective/Complaints: No pain c/os , slept well  ROS: Pt denies fever, rash/itching, headache, blurred or double vision, nausea, vomiting, abdominal pain,  chest pain, shortness of breath, palpitations, dysuria, dizziness,     Objective: Vital Signs: Blood pressure (!) 149/55, pulse 75, temperature 97.9 F (36.6 C), temperature source Oral, resp. rate 18, height 6\' 2"  (1.88 m), weight 104.8 kg (231 lb 1.6 oz), SpO2 96 %. No results found. No results for input(s): WBC, HGB, HCT, PLT in the last 72 hours. No results for input(s): NA, K, CL, GLUCOSE, BUN, CREATININE, CALCIUM in the last 72 hours.  Invalid input(s): CO CBG (last 3)   Recent Labs  01/25/16 1633 01/25/16 2111 01/26/16 0641  GLUCAP 95 125* 128*    Wt Readings from Last 3 Encounters:  01/22/16 104.8 kg (231 lb 1.6 oz)  01/10/16 100.7 kg (222 lb)    Physical Exam:  Constitutional: He is oriented to person, place, and time. He appears well-developed.  HENT:  Head: Normocephalicand atraumatic.  Right Ear: External earnormal.  Left Ear: External earnormal.  Eyes: Conjunctivaeand EOMare normal. Pupils are equal, round, and reactive to light.  Neck: No tracheal deviationpresent. No thyromegalypresent.  Cardiovascular: Normal rate, regular rhythmand normal heart sounds.  Respiratory: Effort normaland breath sounds normal. No respiratory distress. He has no wheezes. He has no rales.  GI: Soft. Bowel sounds are normal. He exhibits no distension. There is no tenderness. There is no rebound.  Genitourinary: No penile tenderness. SPC in place Musculoskeletal: He exhibits edema. He exhibits no deformity.  Right leg tender to basic palpation and PROM at hip and knee.  Neurological: He is alertand oriented to person, place, and time.  UE 5/5 prox to distal. RLE: 1/5 HF, KE 3/5 ADF/PF. LLE: 3- HF, 3 KE and 4/5 ADF/PF. +/- LT bilateral distal  lower extremities. Cognition appropriate.  Skin: Skin is warm.  Sacral wound with exposed pink granulation tissue. Less necrotic debris. Operative incision sites clean. Suprapubic catheter site clean with some irritation of skin where anchoring sutures located Psychiatric: He has a normal mood and affect. His behavior is normal. Judgmentand thought contentnormal  Assessment/Plan: 1. Functional and mobility deficits secondary to polytrauma which require 3+ hours per day of interdisciplinary therapy in a comprehensive inpatient rehab setting. Physiatrist is providing close team supervision and 24 hour management of active medical problems listed below. Physiatrist and rehab team continue to assess barriers to discharge/monitor patient progress toward functional and medical goals.  Function:  Bathing Bathing position   Position: Bed  Bathing parts Body parts bathed by patient: Right upper leg, Left upper leg Body parts bathed by helper: Buttocks  Bathing assist Assist Level: 2 helpers      Upper Body Dressing/Undressing Upper body dressing   What is the patient wearing?: Pull over shirt/dress     Pull over shirt/dress - Perfomed by patient: Thread/unthread right sleeve, Thread/unthread left sleeve, Put head through opening Pull over shirt/dress - Perfomed by helper: Pull shirt over trunk        Upper body assist Assist Level: Touching or steadying assistance(Pt > 75%)      Lower Body Dressing/Undressing Lower body dressing   What is the patient wearing?: Pants   Underwear - Performed by helper: Thread/unthread right underwear leg, Thread/unthread left underwear leg, Pull underwear up/down Pants- Performed by patient: Thread/unthread left pants leg Pants- Performed by helper: Thread/unthread right pants leg, Pull  pants up/down   Non-skid slipper socks- Performed by helper: Don/doff right sock, Don/doff left sock       Shoes - Performed by helper: Don/doff right shoe,  Don/doff left shoe, Fasten right, Fasten left       TED Hose - Performed by helper: Don/doff right TED hose, Don/doff left TED hose  Lower body assist Assist for lower body dressing: 2 Helpers      Toileting Toileting     Toileting steps completed by helper: Adjust clothing prior to toileting, Adjust clothing after toileting, Performs perineal hygiene Toileting Assistive Devices: Toilet aid  Toileting assist Assist level: More than reasonable time, Set up/obtain supplies, Two helpers, Supervision or verbal cues   Transfers Chair/bed transfer Chair/bed transfer activity did not occur: Safety/medical concerns Chair/bed transfer method: Anterior/posterior Chair/bed transfer assist level: 2 helpers Chair/bed transfer assistive device: Armrests     Locomotion Ambulation Ambulation activity did not occur: Safety/medical Editor, commissioning activity did not occur: Safety/medical concerns     Assist Level:  (unable to tolerate sitting up or transfer to wc)  Cognition Comprehension Comprehension assist level: Follows complex conversation/direction with no assist  Expression Expression assist level: Expresses complex ideas: With no assist  Social Interaction Social Interaction assist level: Interacts appropriately with others - No medications needed.  Problem Solving Problem solving assist level: Solves complex problems: Recognizes & self-corrects  Memory Memory assist level: Recognizes or recalls 50 - 74% of the time/requires cueing 25 - 49% of the time   Medical Problem List and Plan: 1.  Multi pelvic fractures status post sacroiliac screw fixation secondary to trauma.              -Touchdown weightbearing right leg to facilitate transfers. CIR PT/OT             -Weightbearing as tolerated left leg for transfers only 8 weeks, discussed prec with pt and wife 2.  DVT Prophylaxis/Anticoagulation: SQ Lovenox.  -vascular study normal 3. Pain Management: Neurontin  300 mg TID  -continue fentanyl patch at 71mcg   - oxycodone prn/ with two scheduled doses to coincide with therapy  -much improved pain levels today 4. Acute blood loss anemia.Fergon 324 mg bid. Follow-up CBC 5. Neuropsych: This patient is capable of making decisions on his own behalf. 6. Skin/Wound Care/Sacral wound: air mattress.  -hydrotherapy helping  -collagenase to wound daily  -nutrition (protein supps/low albumin) 7. Fluids/Electrolytes/Nutrition: encourage PO 8. Urethral trauma. Status post suprapubic catheter. Follow-up with changes as directed. Urine remains clear/yellow    -tube needs to be fixed more securely to skin to avoid irritation around suture sites 9. Diabetes mellitus with peripheral neuropathy. Hemoglobin A1c 7.9. Levemir 15 units daily at bedtime, Glucophage 1000 mg twice a day, Glucotrol 10 mg before breakfast and 5 mg before supper.             - CBG (last 3)   Recent Labs  01/25/16 1633 01/25/16 2111 01/26/16 0641  GLUCAP 95 125* 128*     10. Hypertension. Norvasc 7.5 mg daily. Monitor with increased mobility, no change for now Vitals:   01/26/16 0530 01/26/16 0854  BP: (!) 148/65 (!) 149/55  Pulse: 75   Resp: 18   Temp: 97.9 F (36.6 C)    11. Hyperlipidemia. Lipitor 12. Constipation. Laxative assist   LOS (Days) 9 A FACE TO FACE EVALUATION WAS PERFORMED  Alysia Penna E 01/26/2016 9:25 AM

## 2016-01-27 ENCOUNTER — Inpatient Hospital Stay (HOSPITAL_COMMUNITY): Payer: Medicare Other | Admitting: Occupational Therapy

## 2016-01-27 ENCOUNTER — Ambulatory Visit (HOSPITAL_COMMUNITY): Payer: Medicare Other

## 2016-01-27 ENCOUNTER — Inpatient Hospital Stay (HOSPITAL_COMMUNITY): Payer: Medicare Other | Admitting: Physical Therapy

## 2016-01-27 LAB — GLUCOSE, CAPILLARY
GLUCOSE-CAPILLARY: 127 mg/dL — AB (ref 65–99)
GLUCOSE-CAPILLARY: 91 mg/dL (ref 65–99)
Glucose-Capillary: 176 mg/dL — ABNORMAL HIGH (ref 65–99)
Glucose-Capillary: 126 mg/dL — ABNORMAL HIGH (ref 65–99)

## 2016-01-27 NOTE — Progress Notes (Signed)
Physical Therapy Wound Treatment Patient Details  Name: Christian [name] MRN: [id number] Date of Birth: [birth year]  Today's Date: 01/27/2016 PT Individual Time: 0833-0921 PT Individual Time Calculation (min): 48 min    Subjective Subjective: Pt pleasant and agreeable to therapy Patient and Family Stated Goals: Heal wound Date of Onset:  (Unknown)  Pain Score:  Pt reports moderate pain during session due to positioning and debridement time was shortened due to pain level. Pt was premedicated prior to session.   Wound Assessment  Pressure Injury 01/17/16 Unstageable - Full thickness tissue loss in which the base of the ulcer is covered by slough (yellow, tan, gray, green or brown) and/or eschar (tan, brown or black) in the wound bed. purple discolored oval area reportedly from cr (Active)  Dressing Type Other (Comment) 01/27/2016  9:33 AM  Dressing Changed;Clean;Dry;Intact 01/27/2016  9:33 AM  Dressing Change Frequency Daily 01/27/2016  9:33 AM  State of Healing Eschar 01/27/2016  9:33 AM  Site / Wound Assessment Yellow;Pink 01/27/2016  9:33 AM  % Wound base Red or Granulating 10% 01/27/2016  9:33 AM  % Wound base Yellow 60% 01/27/2016  9:33 AM  % Wound base Black 15% 01/27/2016  9:33 AM  % Wound base Other (Comment) 15% 01/27/2016  9:33 AM  Peri-wound Assessment Intact;Erythema (blanchable) 01/27/2016  9:33 AM  Wound Length (cm) 7.9 cm 01/21/2016  8:00 AM  Wound Width (cm) 5 cm 01/21/2016  8:00 AM  Wound Depth (cm) 0.1 cm 01/21/2016  8:00 AM  Margins Unattached edges (unapproximated) 01/27/2016  9:33 AM  Drainage Amount Moderate 01/27/2016  9:33 AM  Drainage Description Purulent;Odor;Green 01/27/2016  9:33 AM  Treatment Debridement (Selective);Hydrotherapy (Pulse lavage);Packing (Saline gauze) 01/27/2016  9:33 AM  Santyl applied to wound bed prior to applying dressing.   Hydrotherapy Pulsed lavage therapy - wound location: Sacrum Pulsed Lavage with Suction (psi): 12 psi Pulsed  Lavage with Suction - Normal Saline Used: 1000 mL Pulsed Lavage Tip: Tip with splash shield Selective Debridement Selective Debridement - Location: Sacrum Selective Debridement - Tools Used: Forceps;Scalpel Selective Debridement - Tissue Removed: Yellow and brown/black necrotic tissue   Wound Assessment and Plan  Wound Therapy - Assess/Plan/Recommendations Wound Therapy - Clinical Statement: Pt will benefit from continued hydrotherapy for selective removal of nonviable tissue, and to promote wound bed healing.  Wound Therapy - Functional Problem List: Decreased tolerance for OOB sitting in chair Factors Delaying/Impairing Wound Healing: Diabetes Mellitus;Immobility Hydrotherapy Plan: Debridement;Dressing change;Patient/family education;Pulsatile lavage with suction Wound Therapy - Frequency: 6X / week Wound Therapy - Follow Up Recommendations: Home health RN Wound Plan: See above  Wound Therapy Goals- Improve the function of patient's integumentary system by progressing the wound(s) through the phases of wound healing (inflammation - proliferation - remodeling) by: Decrease Necrotic Tissue to: 25% Decrease Necrotic Tissue - Progress: Progressing toward goal Increase Granulation Tissue to: 75% Increase Granulation Tissue - Progress: Progressing toward goal Goals/treatment plan/discharge plan were made with and agreed upon by patient/family: Yes Time For Goal Achievement: 7 days Wound Therapy - Potential for Goals: Excellent  Goals will be updated until maximal potential achieved or discharge criteria met.  Discharge criteria: when goals achieved, discharge from hospital, MD decision/surgical intervention, no progress towards goals, refusal/missing three consecutive treatments without notification or medical reason.  GP     Rolinda Roan 01/27/2016, 9:38 AM   Rolinda Roan, PT, DPT Acute Rehabilitation Services Pager: [phone]

## 2016-01-27 NOTE — Progress Notes (Signed)
Occupational Therapy Session Note  Patient Details  Name: Christian [name] MRN: [id number] Date of Birth: 1940-12-15  Today's Date: 01/27/2016 OT Individual Time: [id number] and [id number] OT Individual Time Calculation (min): 27 min and 88 min     Short Term Goals: Week 2:  OT Short Term Goal 1 (Week 2): Pt will tolerate 2 hours OOB in order to increase upright tolerance and functional positioning OT Short Term Goal 2 (Week 2): Pt will compete UB dressing in seated position (w/c or EOB) with set-up/ supervision OT Short Term Goal 3 (Week 2): Pt will complete 3 grooming tasks seated in w/c with set-up OT Short Term Goal 4 (Week 2): Pt will transfer with assist of +1 in order to decrease caregiver burden  Skilled Therapeutic Interventions/Progress Updates:    Session 1 : Upon entering the room, pt supine in bed and having just received food. Pt needing set up A to cut food items and OT assisted with bed positioning as pt declined to sit EOB for meal. Pt needing min A for positioning this session and he directed care. OT educating and discussing pt's discharge and current progress towards OT goals. Pt verbalized he planned on making short realistic goals for himself each day in order to reach long term goals for discharge. Pt remained in bed at end of session with call bell and all needed items within reach.   Session 2: Upon entering the room, pt supine in bed with 5/10 pain in R hip this session but pt agreeable to OT intervention. Pt utilized leg lifter in order to perform B LE exercises for hip flex/ext, knee flex/ext, and ankle pumps x 10 reps each. OT assisting pt with further stretch for an additional 10 reps for each exercise mentioned above. OT assisted pt with donning B LE TEDs and explaining the importance of wearing. Pt verbalized understanding. Pt engaged in graded sit up/trunk task with pt performing supine>long sitting with HOB elevated to 40 degrees. Pt needing min A to come to upright  sitting position x 5 reps. Pt very fatigued and needing rest break. OT provided paper handout and demonstrated use of green, level 2 theraband for B UE strengthening exercises. Pt returned demonstrations x 10 reps each for shoulder diagonals, shoulder flexion, bicep curl, chest pulls, and alternating punches. Pt needing min verbal cues for proper technique. Pt remaining supine in bed at end of session.   Therapy Documentation Precautions:  Precautions Precautions: Fall Restrictions Weight Bearing Restrictions: Yes RLE Weight Bearing: Non weight bearing LLE Weight Bearing: Weight bearing as tolerated Other Position/Activity Restrictions: LLE WBAT for transfers only    Vital Signs: Therapy Vitals Temp: 99 F (37.2 C) Temp Source: Oral Pulse Rate: 96 Resp: 18 BP: (!) 141/62 Patient Position (if appropriate): Lying Oxygen Therapy SpO2: 95 % O2 Device: Not Delivered ADL: ADL ADL Comments: see Functional Assessment  See Function Navigator for Current Functional Status.   Therapy/Group: Individual Therapy  Christian [name] 01/27/2016, 3:42 PM

## 2016-01-27 NOTE — Progress Notes (Signed)
[facility] PHYSICAL MEDICINE & REHABILITATION     PROGRESS NOTE    Subjective/Complaints: Happy to rest this past weekend!! Pain control better. No new issues otherwise.   ROS: Pt denies fever, rash/itching, headache, blurred or double vision, nausea, vomiting, abdominal pain,  chest pain, shortness of breath, palpitations, dysuria, dizziness,     Objective: Vital Signs: Blood pressure (!) 153/63, pulse 89, temperature 98.2 F (36.8 C), temperature source Oral, resp. rate 16, height 6\' 2"  (1.88 m), weight 104.8 kg (231 lb 1.6 oz), SpO2 95 %. No results found. No results for input(s): WBC, HGB, HCT, PLT in the last 72 hours. No results for input(s): NA, K, CL, GLUCOSE, BUN, CREATININE, CALCIUM in the last 72 hours.  Invalid input(s): CO CBG (last 3)   Recent Labs  01/26/16 2036 01/27/16 0656 01/27/16 1132  GLUCAP 215* 127* 176*    Wt Readings from Last 3 Encounters:  01/22/16 104.8 kg (231 lb 1.6 oz)  01/10/16 100.7 kg (222 lb)    Physical Exam:  Constitutional: He is oriented to person, place, and time. He appears well-developed.  HENT:  Head: Normocephalicand atraumatic.  Right Ear: External earnormal.  Left Ear: External earnormal.  Eyes: Conjunctivaeand EOMare normal. Pupils are equal, round, and reactive to light.  Neck: No tracheal deviationpresent. No thyromegalypresent.  Cardiovascular: Normal rate, regular rhythmand normal heart sounds.  Respiratory: Effort normaland breath sounds normal. No respiratory distress. He has no wheezes. He has no rales.  GI: Soft. Bowel sounds are normal. He exhibits no distension. There is no tenderness. There is no rebound.  Genitourinary: No penile tenderness. SPC in place Musculoskeletal: He exhibits edema. He exhibits no deformity.  Right leg tender to basic palpation and PROM at hip and knee.  Neurological: He is alertand oriented to person, place, and time.  UE 5/5 prox to distal. RLE: 1/5 HF, KE 3/5 ADF/PF.  LLE: 3- HF, 3 KE and 4/5 ADF/PF. +/- LT bilateral distal lower extremities. Cognition appropriate.  Skin: Skin is warm.  Sacral wound with exposed pink granulation tissue. Less necrotic debris. Operative incision sites clean. Suprapubic catheter site clean with some irritation of skin where anchoring sutures located Psychiatric: He has a normal mood and affect. His behavior is normal. Judgmentand thought contentnormal  Assessment/Plan: 1. Functional and mobility deficits secondary to polytrauma which require 3+ hours per day of interdisciplinary therapy in a comprehensive inpatient rehab setting. Physiatrist is providing close team supervision and 24 hour management of active medical problems listed below. Physiatrist and rehab team continue to assess barriers to discharge/monitor patient progress toward functional and medical goals.  Function:  Bathing Bathing position   Position: Bed  Bathing parts Body parts bathed by patient: Right upper leg, Left upper leg Body parts bathed by helper: Buttocks  Bathing assist Assist Level: 2 helpers      Upper Body Dressing/Undressing Upper body dressing   What is the patient wearing?: Pull over shirt/dress     Pull over shirt/dress - Perfomed by patient: Thread/unthread right sleeve, Thread/unthread left sleeve, Put head through opening Pull over shirt/dress - Perfomed by helper: Pull shirt over trunk        Upper body assist Assist Level: Touching or steadying assistance(Pt > 75%)      Lower Body Dressing/Undressing Lower body dressing   What is the patient wearing?: Pants   Underwear - Performed by helper: Thread/unthread right underwear leg, Thread/unthread left underwear leg, Pull underwear up/down Pants- Performed by patient: Thread/unthread left pants leg Pants-  Performed by helper: Thread/unthread right pants leg, Pull pants up/down   Non-skid slipper socks- Performed by helper: Don/doff right sock, Don/doff left sock        Shoes - Performed by helper: Don/doff right shoe, Don/doff left shoe, Fasten right, Fasten left       TED Hose - Performed by helper: Don/doff right TED hose, Don/doff left TED hose  Lower body assist Assist for lower body dressing: 2 Helpers      Toileting Toileting     Toileting steps completed by helper: Adjust clothing prior to toileting, Adjust clothing after toileting, Performs perineal hygiene Toileting Assistive Devices: Toilet aid  Toileting assist Assist level: More than reasonable time, Set up/obtain supplies, Two helpers, Supervision or verbal cues   Transfers Chair/bed transfer Chair/bed transfer activity did not occur: Safety/medical concerns Chair/bed transfer method: Anterior/posterior Chair/bed transfer assist level: 2 helpers Chair/bed transfer assistive device: Armrests     Locomotion Ambulation Ambulation activity did not occur: Safety/medical Editor, commissioning activity did not occur: Safety/medical concerns     Assist Level:  (unable to tolerate sitting up or transfer to wc)  Cognition Comprehension Comprehension assist level: Follows complex conversation/direction with no assist  Expression Expression assist level: Expresses complex ideas: With no assist  Social Interaction Social Interaction assist level: Interacts appropriately with others - No medications needed.  Problem Solving Problem solving assist level: Solves complex problems: Recognizes & self-corrects  Memory Memory assist level: Recognizes or recalls 50 - 74% of the time/requires cueing 25 - 49% of the time   Medical Problem List and Plan: 1.  Multi pelvic fractures status post sacroiliac screw fixation secondary to trauma.              -Touchdown weightbearing right leg to facilitate transfers. CIR PT/OT             -Weightbearing as tolerated left leg for transfers only 8 weeks,  2.  DVT Prophylaxis/Anticoagulation: SQ Lovenox.  -vascular study normal 3. Pain  Management: Neurontin 300 mg TID  -continue fentanyl patch at 30mcg   - oxycodone prn/ with two scheduled doses to coincide with therapy  -much improved pain levels overall 4. Acute blood loss anemia.Fergon 324 mg bid. Follow-up CBC 5. Neuropsych: This patient is capable of making decisions on his own behalf. 6. Skin/Wound Care/Sacral wound: air mattress.  -hydrotherapy helping  -collagenase to wound daily    7. Fluids/Electrolytes/Nutrition: encourage PO 8. Urethral trauma. Status post suprapubic catheter. Follow-up with changes as directed. Urine remains clear/yellow    -tube needs to be fixed more securely to skin to avoid irritation around suture sites 9. Diabetes mellitus with peripheral neuropathy. Hemoglobin A1c 7.9. Levemir 15 units daily at bedtime, Glucophage 1000 mg twice a day, Glucotrol 10 mg before breakfast and 5 mg before supper.             - CBG (last 3)   Recent Labs  01/26/16 2036 01/27/16 0656 01/27/16 1132  GLUCAP 215* 127* 176*   -has had good control up until the last 24 hours---obsv for now   10. Hypertension. Norvasc 7.5 mg daily. Monitor with increased mobility, no change for now Vitals:   01/27/16 0537 01/27/16 0839  BP: (!) 149/66 (!) 153/63  Pulse: 89   Resp:    Temp: 98.2 F (36.8 C)    11. Hyperlipidemia. Lipitor 12. Constipation. Laxative assist   LOS (Days) 10 A FACE TO FACE EVALUATION WAS PERFORMED  Kerra Guilfoil T 01/27/2016 2:10 PM

## 2016-01-27 NOTE — Progress Notes (Signed)
Physical Therapy Session Note  Patient Details  Name: Christian [name] MRN: [id number] Date of Birth: [birth year]  Today's Date: 01/27/2016 PT Individual Time: 1300-1415 PT Individual Time Calculation (min): 75 min    Short Term Goals: Week 2:  PT Short Term Goal 1 (Week 2): Pt will tolerate sitting up in w/c >30 min PT Short Term Goal 2 (Week 2): Pt will perform A/P transfer with modA +2 PT Short Term Goal 3 (Week 2): Pt will perform rolling R/L with minA PT Short Term Goal 4 (Week 2): Pt will propel w/c x50' with BUE and modA  Skilled Therapeutic Interventions/Progress Updates:   Pt received supine in bed, c/o pain 8/10 in R/mid pelvis and agreeable to treatment. Transfer bed<>w/c with +2A A/P with pt encouraged to direct care for LE management and sequencing. Once seated in w/c, LEs resting on leg rests with pt reporting unable to find a position of relief. Attempted w/c propulsion; instructed pt in hand placement and pt able to reach LUE down for brake, however unable to reach back for rim d/t pain and heavy reliance on UEs on arm rests to reduce weight bearing through hips. Pt transported in w/c dependent to/from rehab gym to show pt where therapy occurs once able to leave room regularly and tolerating up in chair for longer periods of time. Returned to bed as above due to pt unable to tolerate pain any longer. Supine in bed pt educated on raising HOB as high as tolerable; able to max out HOB angle ~55 degrees; pt did not recall doing this last week and reminded pt to be working on upright sitting tolerance as often as possible to improve tolerance in wheelchair. Supine RLE PROM hip flexion/extension, internal/external rotation. Modified situps with HOB elevated to 55 degrees, use of UEs to bring back off bed to improve weight bearing tolerance and core activation for carryover into transfers; 10 reps with rest breaks after every 2-3 reps and cues for exhalation during exertion. Pt remained supine  in bed at end of session, all needs in reach.   Therapy Documentation Precautions:  Precautions Precautions: Fall Restrictions Weight Bearing Restrictions: Yes RLE Weight Bearing: Non weight bearing LLE Weight Bearing: Weight bearing as tolerated Other Position/Activity Restrictions: LLE WBAT for transfers only   See Function Navigator for Current Functional Status.   Therapy/Group: Individual Therapy  [name] 01/27/2016, 2:03 PM

## 2016-01-28 ENCOUNTER — Inpatient Hospital Stay (HOSPITAL_COMMUNITY): Payer: Medicare Other | Admitting: Occupational Therapy

## 2016-01-28 ENCOUNTER — Inpatient Hospital Stay (HOSPITAL_COMMUNITY): Payer: Medicare Other | Admitting: *Deleted

## 2016-01-28 ENCOUNTER — Ambulatory Visit (HOSPITAL_COMMUNITY): Payer: Medicare Other

## 2016-01-28 ENCOUNTER — Inpatient Hospital Stay (HOSPITAL_COMMUNITY): Payer: Medicare Other | Admitting: Physical Therapy

## 2016-01-28 LAB — GLUCOSE, CAPILLARY
GLUCOSE-CAPILLARY: 174 mg/dL — AB (ref 65–99)
Glucose-Capillary: 128 mg/dL — ABNORMAL HIGH (ref 65–99)
Glucose-Capillary: 199 mg/dL — ABNORMAL HIGH (ref 65–99)
Glucose-Capillary: 109 mg/dL — ABNORMAL HIGH (ref 65–99)

## 2016-01-28 NOTE — Consult Note (Signed)
Wound type: Re-assessed sacrum wound during hydrotherapy with PT.  Refer to previous [place] note on 10/2; this is not a pressure injury related to hospitalization, it was a deep tissue injury which occurred during the patient's crush injury and was present on admission. Hydrotherapy has removed outer layer of nonviable tissue, but patient still remains with a significant amt of nonviable tissue.  Optimal plan of care would be to continue as long as possible to promote healing.  Pt's wife at the bedside to assess wound appearance and discuss plan of care.  Pt has a large amt of difficulty moving related to pain and fractures.  He is on an air mattress to decrease pressure to the affected area. Refer to PT notes for measurements and percentages. Periwound: Intact skin surrounding Dressing procedure/placement/frequency: Continue present plan of care with hydrotherapy to assist with removal of nonviable tissue. This therapy is not available if patient is discharged home, and is only provided at a few SNF settings. Santyl ointment for enzymatic debridement.   Please re-consult if further assistance is needed.  Thank-you,  Julien Girt MSN, [place], [place], [place], [place]

## 2016-01-28 NOTE — Progress Notes (Signed)
Occupational Therapy Note  Patient Details  Name: NECO VIVERITO MRN: [id number] Date of Birth: January 06, 1941  Today's Date: 01/28/2016 OT Individual Time: 1130-1200 OT Individual Time Calculation (min): 30 min    Pt c/o increased pain (unrated) with movement; repositioned Individual Therapy  Pt resting in recliner upon arrival.  Focus on A/P transfer from recliner to bed with emphasis on increased reciprocal scoots onto bed.  Pt required more than a reasonable amount of time to initiate reciprocal scoots and does not tolerate tactile facilitation.  Pt used bed rail to pull and chair hand rail to push but exhibited difficulty unweighting his L hip to scoot.  Pt required tot A + 2 for transfer with pt providing approx 25% of effort. Pt remained in bed with all needs within reach and wife present.   Leotis Shames [facility] 01/28/2016, 12:09 PM

## 2016-01-28 NOTE — Plan of Care (Signed)
Problem: RH Simple Meal Prep Goal: LTG Patient will perform simple meal prep w/assist (OT) LTG: Patient will perform simple meal prep with assistance, with/without cues (OT).  Outcome: Not Applicable Date Met: 09/81/19 D/c goal at this time due to focus on sitting tolerance and transfers.

## 2016-01-28 NOTE — Progress Notes (Signed)
Physical Therapy Wound Treatment Patient Details  Name: Christian [name] MRN: [id number] Date of Birth: 04/25/1940  Today's Date: 01/28/2016 PT Individual Time: 0833-0904 PT Individual Time Calculation (min): 31 min    Subjective Subjective: Pt pleasant and agreeable to therapy Patient and Family Stated Goals: Heal wound Date of Onset:  (Unknown)  Pain Score:  Pt reports throbbing in pelvis due to sidelying position. Was able to better tolerate hydro session today.   Wound Assessment  Pressure Injury 01/17/16 Unstageable - Full thickness tissue loss in which the base of the ulcer is covered by slough (yellow, tan, gray, green or brown) and/or eschar (tan, brown or black) in the wound bed. purple discolored oval area reportedly from cr (Active)  Dressing Type Other (Comment) 01/28/2016  9:26 AM  Dressing Changed;Clean;Dry;Intact 01/28/2016  9:26 AM  Dressing Change Frequency Daily 01/28/2016  9:26 AM  State of Healing Eschar 01/28/2016  9:26 AM  Site / Wound Assessment Yellow;Pink 01/28/2016  9:26 AM  % Wound base Red or Granulating 10% 01/28/2016  9:26 AM  % Wound base Yellow 75% 01/28/2016  9:26 AM  % Wound base Black 15% 01/28/2016  9:26 AM  % Wound base Other (Comment) 0% 01/28/2016  9:26 AM  Peri-wound Assessment Intact;Erythema (blanchable) 01/28/2016  9:26 AM  Wound Length (cm) 7.9 cm 01/21/2016  8:00 AM  Wound Width (cm) 5 cm 01/21/2016  8:00 AM  Wound Depth (cm) 0.1 cm 01/21/2016  8:00 AM  Margins Unattached edges (unapproximated) 01/28/2016  9:26 AM  Drainage Amount Moderate 01/28/2016  9:26 AM  Drainage Description Purulent;Odor 01/28/2016  9:26 AM  Treatment Debridement (Selective);Hydrotherapy (Pulse lavage);Packing (Saline gauze) 01/28/2016  9:26 AM  Santyl applied to wound bed prior to applying dressing.   Hydrotherapy Pulsed lavage therapy - wound location: Sacrum Pulsed Lavage with Suction (psi): 12 psi Pulsed Lavage with Suction - Normal Saline Used: 1000  mL Pulsed Lavage Tip: Tip with splash shield Selective Debridement Selective Debridement - Location: Sacrum Selective Debridement - Tools Used: Forceps;Scalpel Selective Debridement - Tissue Removed: Yellow and brown/black necrotic tissue   Wound Assessment and Plan  Wound Therapy - Assess/Plan/Recommendations Wound Therapy - Clinical Statement: Pt will benefit from continued hydrotherapy for selective removal of nonviable tissue, and to promote wound bed healing.  Wound Therapy - Functional Problem List: Decreased tolerance for OOB sitting in chair Factors Delaying/Impairing Wound Healing: Diabetes Mellitus;Immobility Hydrotherapy Plan: Debridement;Dressing change;Patient/family education;Pulsatile lavage with suction Wound Therapy - Frequency: 6X / week Wound Therapy - Follow Up Recommendations: Home health RN Wound Plan: See above  Wound Therapy Goals- Improve the function of patient's integumentary system by progressing the wound(s) through the phases of wound healing (inflammation - proliferation - remodeling) by: Decrease Necrotic Tissue to: 25% Decrease Necrotic Tissue - Progress: Progressing toward goal Increase Granulation Tissue to: 75% Increase Granulation Tissue - Progress: Progressing toward goal Goals/treatment plan/discharge plan were made with and agreed upon by patient/family: Yes Time For Goal Achievement: 7 days Wound Therapy - Potential for Goals: Excellent  Goals will be updated until maximal potential achieved or discharge criteria met.  Discharge criteria: when goals achieved, discharge from hospital, MD decision/surgical intervention, no progress towards goals, refusal/missing three consecutive treatments without notification or medical reason.  GP     Kirkman, Laura 01/28/2016, 9:58 AM   Laura Kirkman, PT, DPT Acute Rehabilitation Services Pager: [phone]    

## 2016-01-28 NOTE — Patient Care Conference (Signed)
Inpatient RehabilitationTeam Conference and Plan of Care Update Date: 01/28/2016   Time: 2:10 PM    Patient Name: Christian [name]      Medical Record Number: [id number]  Date of Birth: [birth year] Sex: Male         Room/Bed: 4W21C/4W21C-01 Payor Info: Payor: MEDICARE / Plan: MEDICARE PART A AND B / Product Type: *No Product type* /    Admitting Diagnosis: Pelvis FX  Admit Date/Time:  01/17/2016  4:59 PM Admission Comments: No comment available   Primary Diagnosis:  Multiple pelvic fractures (HCC) Principal Problem: Multiple pelvic fractures [facility])  Patient Active Problem List   Diagnosis Date Noted  . Sacral decubitus ulcer 01/17/2016  . Accident caused by farm tractor 01/15/2016  . Urethral injury 01/15/2016  . Acute blood loss anemia 01/15/2016  . Acute kidney injury ([place]) 01/15/2016  . Multiple pelvic fractures ([place]) 01/10/2016    Expected Discharge Date: Expected Discharge Date: 02/12/16  Team Members Present: Physician leading conference: Dr. Alger [name] Social Worker Present: Christian Pall, LCSW Nurse Present: Christian Roberts, RN PT Present: Christian [name], PT OT Present: Christian [name], OT SLP Present: Christian [name], SLP PPS Coordinator present : Christian Nakayama, RN, CRRN     Current Status/Progress Goal Weekly Team Focus  Medical   pain improving but still a factor. hydro therapy for wound showin progress.  pain control  pain mgt, wound care, regulation of bowels   Bowel/Bladder   SP cath, continent bowel.  SP managed, no s/s infection. BM QD, QOD with PRNs as needed.  Monitor.   Swallow/Nutrition/ Hydration             ADL's   min A - mod A for LB self care, setup to min A for UB, max A +2 for A/P transfers; tolerate sitting in recliner today  min A transfers, LB self care, bathing; min A for toileting on BSC  sitting tolerance in upright position, transfers in and out of bed, use of AE for LB dressing,    Mobility   maxA bed mobility and +2 A/P transfers, able to  tolerate OOB <1 hour in w/c  supervision w/c level transfers; mod I w/c mobility  pain management, OOB tolerance, bed mobility, transfers   Communication             Safety/Cognition/ Behavioral Observations            Pain   Scheduled Oxy IR, occasional PRN dose at HS.  Managed at goal 3/10  Monitor, encourage alternative methods of pain control; repositioning.   Skin   Sacral wound with hydrotherapy, santyl, air mattress.  Would in healing process at DC, no new breakdown, no injuries this admission.  Monitor.    Rehab Goals Patient on target to meet rehab goals: Yes *See Care Plan and progress notes for long and short-term goals.  Barriers to Discharge: pain mgt, ortho precautions    Possible Resolutions to Barriers:  continued adjustment of analgesic regimen    Discharge Planning/Teaching Needs:  planning home with wife who can provide 24/7 assistance.  Teaching closer to d/c   Team Discussion:  Up in recliner today with w/c cushion and reclined is all he could really tolerate.  Poor tolerance for sitting in w/c with max x 1 hr.  Pt avoids any position that causes any amount of pain.  Currently heavy assistance +2 for transfers.  Is dressing at bed level with min assist.  May need to be more aggressive with pain management.  Team feels need to extend LOS x week  Revisions to Treatment Plan:  Change in d/c date to 11/1   Continued Need for Acute Rehabilitation Level of Care: The patient requires daily medical management by a physician with specialized training in physical medicine and rehabilitation for the following conditions: Daily direction of a multidisciplinary physical rehabilitation program to ensure safe treatment while eliciting the highest outcome that is of practical value to the patient.: Yes Daily medical management of patient stability for increased activity during participation in an intensive rehabilitation regime.: Yes Daily analysis of laboratory values and/or  radiology reports with any subsequent need for medication adjustment of medical intervention for : Post surgical problems;Other  Christian [name], Christian [name] 01/29/2016, 10:14 AM

## 2016-01-28 NOTE — Progress Notes (Signed)
[facility] PHYSICAL MEDICINE & REHABILITATION     PROGRESS NOTE    Subjective/Complaints: Struggles with w/c propulsion because he needs his arms to help decrease pressure on his bottom. Had prolonged bm after sorbitol   ROS: Pt denies fever, rash/itching, headache, blurred or double vision, nausea, vomiting, abdominal pain,  chest pain, shortness of breath, palpitations, dysuria, dizziness,     Objective: Vital Signs: Blood pressure 140/64, pulse 100, temperature 98.2 F (36.8 C), temperature source Oral, resp. rate 18, height 6\' 2"  (1.88 m), weight 104.8 kg (231 lb 1.6 oz), SpO2 95 %. No results found. No results for input(s): WBC, HGB, HCT, PLT in the last 72 hours. No results for input(s): NA, K, CL, GLUCOSE, BUN, CREATININE, CALCIUM in the last 72 hours.  Invalid input(s): CO CBG (last 3)   Recent Labs  01/27/16 1639 01/27/16 2147 01/28/16 0634  GLUCAP 91 126* 128*    Wt Readings from Last 3 Encounters:  01/22/16 104.8 kg (231 lb 1.6 oz)  01/10/16 100.7 kg (222 lb)    Physical Exam:  Constitutional: He is oriented to person, place, and time. He appears well-developed.  HENT:  Head: Normocephalicand atraumatic.  Right Ear: External earnormal.  Left Ear: External earnormal.  Eyes: Conjunctivaeand EOMare normal. Pupils are equal, round, and reactive to light.  Neck: No tracheal deviationpresent. No thyromegalypresent.  Cardiovascular: Normal rate, regular rhythmand normal heart sounds.  Respiratory: Effort normaland breath sounds normal. No respiratory distress. He has no wheezes. He has no rales.  GI: Soft. Bowel sounds are normal. He exhibits no distension. There is no tenderness. There is no rebound.  Genitourinary: No penile tenderness. SPC in place Musculoskeletal: He exhibits edema. He exhibits no deformity.  Right leg tender to basic palpation and PROM at hip and knee.  Neurological: He is alertand oriented to person, place, and time.  UE 5/5  prox to distal. RLE: 1/5 HF, KE 3/5 ADF/PF. LLE: 3- HF, 3 KE and 4/5 ADF/PF. +/- LT bilateral distal lower extremities. Cognition appropriate.  Skin: Skin is warm.  Sacral wound with exposed pink granulation tissue. Less necrotic debris. Operative incision sites clean. Suprapubic catheter site clean with some irritation of skin where anchoring sutures located Psychiatric: He has a normal mood and affect. His behavior is normal. Judgmentand thought contentnormal  Assessment/Plan: 1. Functional and mobility deficits secondary to polytrauma which require 3+ hours per day of interdisciplinary therapy in a comprehensive inpatient rehab setting. Physiatrist is providing close team supervision and 24 hour management of active medical problems listed below. Physiatrist and rehab team continue to assess barriers to discharge/monitor patient progress toward functional and medical goals.  Function:  Bathing Bathing position   Position: Bed  Bathing parts Body parts bathed by patient: Right upper leg, Left upper leg Body parts bathed by helper: Buttocks  Bathing assist Assist Level: 2 helpers      Upper Body Dressing/Undressing Upper body dressing   What is the patient wearing?: Pull over shirt/dress     Pull over shirt/dress - Perfomed by patient: Thread/unthread right sleeve, Thread/unthread left sleeve, Put head through opening Pull over shirt/dress - Perfomed by helper: Pull shirt over trunk        Upper body assist Assist Level: Touching or steadying assistance(Pt > 75%)      Lower Body Dressing/Undressing Lower body dressing   What is the patient wearing?: Pants   Underwear - Performed by helper: Thread/unthread right underwear leg, Thread/unthread left underwear leg, Pull underwear up/down Pants- Performed  by patient: Thread/unthread left pants leg Pants- Performed by helper: Thread/unthread right pants leg, Pull pants up/down   Non-skid slipper socks- Performed by helper:  Don/doff right sock, Don/doff left sock       Shoes - Performed by helper: Don/doff right shoe, Don/doff left shoe, Fasten right, Fasten left       TED Hose - Performed by helper: Don/doff right TED hose, Don/doff left TED hose  Lower body assist Assist for lower body dressing: 2 Helpers      Toileting Toileting     Toileting steps completed by helper: Adjust clothing prior to toileting, Adjust clothing after toileting, Performs perineal hygiene Toileting Assistive Devices: Toilet aid  Toileting assist Assist level: More than reasonable time, Set up/obtain supplies, Two helpers, Supervision or verbal cues   Transfers Chair/bed transfer Chair/bed transfer activity did not occur: Safety/medical concerns Chair/bed transfer method: Anterior/posterior Chair/bed transfer assist level: 2 helpers Chair/bed transfer assistive device: Armrests     Locomotion Ambulation Ambulation activity did not occur: Safety/medical Editor, commissioning activity did not occur: Safety/medical concerns     Assist Level:  (unable to tolerate sitting up or transfer to wc)  Cognition Comprehension Comprehension assist level: Follows complex conversation/direction with no assist  Expression Expression assist level: Expresses complex ideas: With no assist  Social Interaction Social Interaction assist level: Interacts appropriately with others - No medications needed.  Problem Solving Problem solving assist level: Solves complex problems: Recognizes & self-corrects  Memory Memory assist level: Recognizes or recalls 50 - 74% of the time/requires cueing 25 - 49% of the time   Medical Problem List and Plan: 1.  Multi pelvic fractures status post sacroiliac screw fixation secondary to trauma.              -Touchdown weightbearing right leg to facilitate transfers. CIR PT/OT             -Weightbearing as tolerated left leg for transfers only 8 weeks,   -team conference today 2.  DVT  Prophylaxis/Anticoagulation: SQ Lovenox.  -vascular study normal 3. Pain Management: Neurontin 300 mg TID  -continue fentanyl patch at 44mcg   - oxycodone prn/ with two scheduled doses to coincide with therapy  -much improved pain levels overall 4. Acute blood loss anemia.Fergon 324 mg bid. Follow-up CBC 5. Neuropsych: This patient is capable of making decisions on his own behalf. 6. Skin/Wound Care/Sacral wound: air mattress.  -wound improving  -area very sensitive  -hydrotherapy helping  -collagenase to wound daily    7. Fluids/Electrolytes/Nutrition: encourage PO 8. Urethral trauma. Status post suprapubic catheter. Follow-up with changes as directed. Urine remains clear/yellow   9. Diabetes mellitus with peripheral neuropathy. Hemoglobin A1c 7.9. Levemir 15 units daily at bedtime, Glucophage 1000 mg twice a day, Glucotrol 10 mg before breakfast and 5 mg before supper.             - CBG (last 3)   Recent Labs  01/27/16 1639 01/27/16 2147 01/28/16 0634  GLUCAP 91 126* 128*   -has had good control in general  10. Hypertension. Norvasc 7.5 mg daily. Monitor with increased mobility, no change for now Vitals:   01/27/16 1525 01/28/16 0414  BP: (!) 141/62 140/64  Pulse: 96 100  Resp: 18 18  Temp: 99 F (37.2 C) 98.2 F (36.8 C)   11. Hyperlipidemia. Lipitor 12. Constipation. Need to be careful not to go too far with laxatives---appears sensitive to these  LOS (Days)  [place] EVALUATION WAS PERFORMED  Jamarria Real T 01/28/2016 9:09 AM

## 2016-01-28 NOTE — Progress Notes (Signed)
Physical Therapy Session Note  Patient Details  Name: Christian [name] MRN: [id number] Date of Birth: August 02, 1940  Today's Date: 01/28/2016 PT Individual Time: 1430-1530 PT Individual Time Calculation (min): 60 min    Short Term Goals: Week 2:  PT Short Term Goal 1 (Week 2): Pt will tolerate sitting up in w/c >30 min PT Short Term Goal 2 (Week 2): Pt will perform A/P transfer with modA +2 PT Short Term Goal 3 (Week 2): Pt will perform rolling R/L with minA PT Short Term Goal 4 (Week 2): Pt will propel w/c x50' with BUE and modA  Skilled Therapeutic Interventions/Progress Updates:   Pt received supine asleep in bed, easily awoken and agreeable to treatment. C/o 6/10 pain in R/mid pelvis, premedicated. Transfer semi-reclined>sitting EOB with +2A and pt improving initiation of UEs for trunk control and scooting. Seated EOB x3-4 min with BUE support, gradually leaning towards L side to avoid weight bearing through R hip. Encouraged pt to return to midline, and able to do so with BUE support and modA from therapist to A with unweighing trunk to move LUE into position. Posterior transfer >w/c with totalA +2. Pt directs care throughout, however typically requesting increased assistance, telling therapist "pull me now" and " get me back quick" with no initiation of UE/trunk movement to A with task. Educated pt during short rest break while completing posterior transfer, on goal of continuing to progress amount of pt assist during transfer, and that a dependent +2 transfer is not therapeutic and will not help pt progress. Pt tolerated sitting in w/c x30 min while LEs repositioned, transferred out into hallway, and attempted to reposition upper body to stay closer to midline in chair for safety to prevent w/c from tipping with strong L lateral lean. Returned to bed +2 anterior transfer as above, pt with increased pain and demonstrating less assist during transfer. Remained supine in bed at end of session, all  needs in reach.   Therapy Documentation Precautions:  Precautions Precautions: Fall Restrictions Weight Bearing Restrictions: Yes RLE Weight Bearing: Non weight bearing LLE Weight Bearing: Weight bearing as tolerated Other Position/Activity Restrictions: LLE WBAT for transfers only   See Function Navigator for Current Functional Status.   Therapy/Group: Individual Therapy  [name] 01/28/2016, 3:46 PM

## 2016-01-28 NOTE — Progress Notes (Signed)
Occupational Therapy Session Note  Patient Details  Name: Christian [name] MRN: [id number] Date of Birth: 1940/10/28  Today's Date: 01/28/2016 OT Individual Time: [id number] OT Individual Time Calculation (min): 75 min     Short Term Goals: Week 2:  OT Short Term Goal 1 (Week 2): Pt will tolerate 2 hours OOB in order to increase upright tolerance and functional positioning OT Short Term Goal 2 (Week 2): Pt will compete UB dressing in seated position (w/c or EOB) with set-up/ supervision OT Short Term Goal 3 (Week 2): Pt will complete 3 grooming tasks seated in w/c with set-up OT Short Term Goal 4 (Week 2): Pt will transfer with assist of +1 in order to decrease caregiver burden  Skilled Therapeutic Interventions/Progress Updates:   1:1 self care retraining at bed level  With setup. PT able to perform UB bathing and dressing with setup and only min A to assist with pulling down back of shirt. Addressed using leg lifter to assist with mobility of LEs in bed for better postioning as needed. Pt able to thread both LEs with reacher and setup and mod VC for sequence; with more than reasonable amt of time. Pt able to laterally lean in bed to pull up pants with more time with multiple leans. After an extended rest, pt performed posterior transfer into recliner (with w/c cushion in chair) with max  A+2 (with 2 person Assisting with right LE positioning. Pt with difficulty supporting his trunk posture and requiring increase assist through transfer. PT able to tolerate sitting in recliner. Pt also able to tolerate his LEs down on the floor while he participated in shaving at bed side table with setup (using bilateral hands for task and not just for holding himself up.) Recliner allowed him to sit more safely with putting his left foot up in chair to help reposition self.  Discussed with pt and wife w/cplanning including home set up and w/c and doorway measurements. Left in recliner to rest.   Therapy  Documentation Precautions:  Precautions Precautions: Fall Restrictions Weight Bearing Restrictions: Yes RLE Weight Bearing: Non weight bearing LLE Weight Bearing: Weight bearing as tolerated Other Position/Activity Restrictions: LLE WBAT for transfers only Pain:  ongoing pain in right hip with sitting or mobility- allowed for rest prn and RN aware  See Function Navigator for Current Functional Status.   Therapy/Group: Individual Therapy  [name] [facility] 01/28/2016, 3:20 PM

## 2016-01-28 NOTE — Progress Notes (Signed)
Patient had Sorbitol at bed time d/t no bowel movement in  3 days (01/24/2016). Patient had an extra extra large liquid brown bowel movement all over bed, floor, bedside table, wall, and himself. Patient stated last time he took Sorbitol the same thing happened. Charge nurse confirmed that the exact same thing happened the last time he took it. After over an hour of cleaning patient, bed, and room he was cleaned and dressing applied to sacrum as ordered on Sundays until his daily scheduled dressing change can be performed. Patient had severe pain in right hip d/t having to roll back and forth. Oxycodone given as ordered. Will continue to monitor.

## 2016-01-29 ENCOUNTER — Inpatient Hospital Stay (HOSPITAL_COMMUNITY): Payer: Medicare Other | Admitting: Occupational Therapy

## 2016-01-29 ENCOUNTER — Ambulatory Visit (HOSPITAL_COMMUNITY): Payer: Medicare Other

## 2016-01-29 ENCOUNTER — Ambulatory Visit: Payer: Self-pay | Admitting: Plastic Surgery

## 2016-01-29 ENCOUNTER — Inpatient Hospital Stay (HOSPITAL_COMMUNITY): Payer: Medicare Other | Admitting: Physical Therapy

## 2016-01-29 DIAGNOSIS — S31000A Unspecified open wound of lower back and pelvis without penetration into retroperitoneum, initial encounter: Secondary | ICD-10-CM

## 2016-01-29 DIAGNOSIS — L89153 Pressure ulcer of sacral region, stage 3: Secondary | ICD-10-CM

## 2016-01-29 LAB — GLUCOSE, CAPILLARY
GLUCOSE-CAPILLARY: 145 mg/dL — AB (ref 65–99)
Glucose-Capillary: 131 mg/dL — ABNORMAL HIGH (ref 65–99)
Glucose-Capillary: 120 mg/dL — ABNORMAL HIGH (ref 65–99)
Glucose-Capillary: 80 mg/dL (ref 65–99)

## 2016-01-29 MED ORDER — CEFAZOLIN SODIUM-DEXTROSE 2-4 GM/100ML-% IV SOLN
2.0000 g | INTRAVENOUS | Status: AC
  Administered 2016-01-30: 2 g via INTRAVENOUS
  Filled 2016-01-29 (×2): qty 100

## 2016-01-29 MED ORDER — CHLORHEXIDINE GLUCONATE CLOTH 2 % EX PADS
6.0000 | MEDICATED_PAD | Freq: Once | CUTANEOUS | Status: AC
  Administered 2016-01-29: 6 via TOPICAL

## 2016-01-29 MED ORDER — SENNOSIDES-DOCUSATE SODIUM 8.6-50 MG PO TABS
2.0000 | ORAL_TABLET | Freq: Every day | ORAL | Status: DC
  Administered 2016-01-29 – 2016-02-02 (×5): 2 via ORAL
  Filled 2016-01-29 (×5): qty 2

## 2016-01-29 MED ORDER — FENTANYL 25 MCG/HR TD PT72
50.0000 ug | MEDICATED_PATCH | TRANSDERMAL | Status: DC
  Administered 2016-01-29 – 2016-02-07 (×4): 50 ug via TRANSDERMAL
  Filled 2016-01-29 (×4): qty 2

## 2016-01-29 NOTE — Progress Notes (Signed)
Physical Therapy Session Note  Patient Details  Name: Christian [name] MRN: [id number] Date of Birth: Mar 07, 1941  Today's Date: 01/29/2016 PT Individual Time: 1115-1200 PT Individual Time Calculation (min): 45 min    Short Term Goals: Week 2:  PT Short Term Goal 1 (Week 2): Pt will tolerate sitting up in w/c >30 min PT Short Term Goal 2 (Week 2): Pt will perform A/P transfer with modA +2 PT Short Term Goal 3 (Week 2): Pt will perform rolling R/L with minA PT Short Term Goal 4 (Week 2): Pt will propel w/c x50' with BUE and modA  Skilled Therapeutic Interventions/Progress Updates:   Pt received seated in recliner, c/o pain as below and agreeable to treatment. Sit <>stand x2 trials with modA, therapist under RUE for trunk support, and use of LUE on armrest. Once in standing, RUE placed on RW for balance and stability. Able to tolerate standing approximately 1 min 30 sec-2 min at a time before returning to sitting for seated rest break. Stand pivot transfer with RW and modA for sit <>stand, min guard for pivotal steps. Sit >supine +2A for LE and trunk management. RLE hip PROM flexion/extension and internal/external rotation. Short arc quads 2x10 RLE, SLR AAROM BLE 1x5 reps. Remained supine in bed at end of session, all needs in reach.   Therapy Documentation Precautions:  Precautions Precautions: Fall Restrictions Weight Bearing Restrictions: Yes RLE Weight Bearing: Non weight bearing LLE Weight Bearing: Weight bearing as tolerated Other Position/Activity Restrictions: LLE WBAT for transfers only Pain: Pain Assessment Pain Assessment: 0-10   See Function Navigator for Current Functional Status.   Therapy/Group: Individual Therapy  [name] 01/29/2016, 12:33 PM

## 2016-01-29 NOTE — Progress Notes (Signed)
Social Work Patient ID: Christian [name], male   DOB: 1940/08/18, 76 y.o.   MRN: [id number]   Met with pt and wife yesterday afternoon to review team conference.  Both aware and agreeable with change in d/c date to 11/1 as progress has been slow this week.  Discussed need to continue to encourage pt up in w/c and MD may adjust pain meds.  Wife very relieved his LOS was extended.  Will continue to follow.  Kaylor Simenson, LCSW

## 2016-01-29 NOTE — Progress Notes (Signed)
Occupational Therapy Session Note  Patient Details  Name: Christian [name] MRN: [id number] Date of Birth: 07/05/1940  Today's Date: 01/29/2016 OT Individual Time: 1000-1100 OT Individual Time Calculation (min): 60 min     Short Term Goals: Week 2:  OT Short Term Goal 1 (Week 2): Pt will tolerate 2 hours OOB in order to increase upright tolerance and functional positioning OT Short Term Goal 2 (Week 2): Pt will compete UB dressing in seated position (w/c or EOB) with set-up/ supervision OT Short Term Goal 3 (Week 2): Pt will complete 3 grooming tasks seated in w/c with set-up OT Short Term Goal 4 (Week 2): Pt will transfer with assist of +1 in order to decrease caregiver burden  Skilled Therapeutic Interventions/Progress Updates:   1st session. Self care retraining at bed level. PT with improved ability to self position self and use reacher and leg lifter to position self and assist with LB dressing. Pt also tolerated sitting up in the bed as far as the [facility] will go. Pt still requires A to pull down back of shirt once donned due to unable to pull self forward and then have a UE free to pll down back of shirt. Pt able to come to EOB with mod A +2 for safety and pt's confidence. Pt able to sit EOB with feet down and resting on the floor with bilateral UE Support (with bed still inflated). Pt then able to come into standing with RW with mod A +2 and tolerate standing for 10-15 seconds at a time. PT did report some sweating and lightheadedness but unable to get BP reading in a short amt of time. Pt then able to perform Standpivot transfer into w/c with mod A +2 ; great improvement to come up and off of bottom. Left in w/c to rest 15 min before next appointment.   2nd session 1:1 Focus on bilateral LE strengthening and conditioning in supine and sidelying on his left side. Focus on mm activation with AAROM to activate glut, quad, hip and hamstring mm to better be able to assist with bed mobility and  positioning of self. Pt continues to demonstrate 2-/5 throughout right LE. Pt able to come to EOB with mod A of one person with more than extra time and min to mod cuing for sequencing. Perform sit to stand with RW with mod A +2( at pt's request) for standpivot transfer to [facility]. PT unable to tolerate sitting on BSC due to hard surface but able to transfer with mod A. Discussed option of padded tub bench but still would be a firmer surface than the bed. Plastics MD came in to discuss upcoming I&D procedure. She reported after procedure pt only able to sit in w/c for therapy and no longer than 20 min at a time for meals and needed to remain in sidelying otherwise. Made team aware of new recommendations. Left pt in bed to rest at end of session.  Therapy Documentation Precautions:  Precautions Precautions: Fall Restrictions Weight Bearing Restrictions: Yes RLE Weight Bearing: Non weight bearing LLE Weight Bearing: Weight bearing as tolerated Other Position/Activity Restrictions: LLE WBAT for transfers only Pain:  ongoing pain in right LE and hip- relief with rest and repositioning  See Function Navigator for Current Functional Status.   Therapy/Group: Individual Therapy  [name] [facility] 01/29/2016, 3:34 PM

## 2016-01-29 NOTE — Progress Notes (Signed)
Physical Therapy Wound Treatment Patient Details  Name: Christian [name] MRN: [id number] Date of Birth: June 13, 1940  Today's Date: 01/29/2016 PT Individual Time: 0837-0915 PT Individual Time Calculation (min): 38 min    Subjective Subjective: Pt pleasant and agreeable to therapy Patient and Family Stated Goals: Heal wound Date of Onset:  (Unknown)  Pain Score:  Pt was premedicated and tolerated treatment well.   Wound Assessment  Pressure Injury 01/17/16 Unstageable - Full thickness tissue loss in which the base of the ulcer is covered by slough (yellow, tan, gray, green or brown) and/or eschar (tan, brown or black) in the wound bed. purple discolored oval area reportedly from cr (Active)  Dressing Type Other (Comment) 01/29/2016  9:33 AM  Dressing Changed;Clean;Dry;Intact 01/29/2016  9:33 AM  Dressing Change Frequency Daily 01/29/2016  9:33 AM  State of Healing Eschar 01/29/2016  9:33 AM  Site / Wound Assessment Yellow;Pink 01/29/2016  9:33 AM  % Wound base Red or Granulating 10% 01/29/2016  9:33 AM  % Wound base Yellow 75% 01/29/2016  9:33 AM  % Wound base Black 15% 01/29/2016  9:33 AM  % Wound base Other (Comment) 0% 01/29/2016  9:33 AM  Peri-wound Assessment Intact;Erythema (blanchable) 01/29/2016  9:33 AM  Wound Length (cm) 7.9 cm 01/21/2016  8:00 AM  Wound Width (cm) 5 cm 01/21/2016  8:00 AM  Wound Depth (cm) 0.1 cm 01/21/2016  8:00 AM  Margins Unattached edges (unapproximated) 01/29/2016  9:33 AM  Drainage Amount Moderate 01/29/2016  9:33 AM  Drainage Description Purulent;Odor;Green 01/29/2016  9:33 AM  Treatment Debridement (Selective);Hydrotherapy (Pulse lavage);Packing (Saline gauze) 01/29/2016  9:33 AM  Santyl applied to wound bed prior to applying dressing.   Hydrotherapy Pulsed lavage therapy - wound location: Sacrum Pulsed Lavage with Suction (psi): 12 psi Pulsed Lavage with Suction - Normal Saline Used: 1000 mL Pulsed Lavage Tip: Tip with splash shield Selective  Debridement Selective Debridement - Location: Sacrum Selective Debridement - Tools Used: Forceps;Scalpel Selective Debridement - Tissue Removed: Yellow and brown/black necrotic tissue   Wound Assessment and Plan  Wound Therapy - Assess/Plan/Recommendations Wound Therapy - Clinical Statement: Pt will benefit from continued hydrotherapy for selective removal of nonviable tissue, and to promote wound bed healing.  Wound Therapy - Functional Problem List: Decreased tolerance for OOB sitting in chair Factors Delaying/Impairing Wound Healing: Diabetes Mellitus;Immobility Hydrotherapy Plan: Debridement;Dressing change;Patient/family education;Pulsatile lavage with suction Wound Therapy - Frequency: 6X / week Wound Therapy - Follow Up Recommendations: Home health RN Wound Plan: See above  Wound Therapy Goals- Improve the function of patient's integumentary system by progressing the wound(s) through the phases of wound healing (inflammation - proliferation - remodeling) by: Decrease Necrotic Tissue to: 25% Decrease Necrotic Tissue - Progress: Progressing toward goal Increase Granulation Tissue to: 75% Increase Granulation Tissue - Progress: Progressing toward goal Goals/treatment plan/discharge plan were made with and agreed upon by patient/family: Yes Time For Goal Achievement: 7 days Wound Therapy - Potential for Goals: Excellent  Goals will be updated until maximal potential achieved or discharge criteria met.  Discharge criteria: when goals achieved, discharge from hospital, MD decision/surgical intervention, no progress towards goals, refusal/missing three consecutive treatments without notification or medical reason.  GP     Rolinda Roan 01/29/2016, 9:38 AM   Rolinda Roan, PT, DPT Acute Rehabilitation Services Pager: [phone]

## 2016-01-29 NOTE — Progress Notes (Signed)
Social Work Patient ID: Christian [name], male   DOB: 1941/03/27, 75 y.o.   MRN: [id number]  Lowella Curb, LCSW Social Worker Signed   Patient Care Conference Date of Service: 01/28/2016  4:04 PM      Hide copied text Hover for attribution information Inpatient RehabilitationTeam Conference and Plan of Care Update Date: 01/28/2016   Time: 2:10 PM      Patient Name: Christian [name]      Medical Record Number: [id number]  Date of Birth: 1940/11/01 Sex: Male         Room/Bed: 4W21C/4W21C-01 Payor Info: Payor: MEDICARE / Plan: MEDICARE PART A AND B / Product Type: *No Product type* /     Admitting Diagnosis: Pelvis FX  Admit Date/Time:  01/17/2016  4:59 PM Admission Comments: No comment available    Primary Diagnosis:  Multiple pelvic fractures (HCC) Principal Problem: Multiple pelvic fractures [facility])       Patient Active Problem List    Diagnosis Date Noted  . Sacral decubitus ulcer 01/17/2016  . Accident caused by farm tractor 01/15/2016  . Urethral injury 01/15/2016  . Acute blood loss anemia 01/15/2016  . Acute kidney injury ([place]) 01/15/2016  . Multiple pelvic fractures ([place]) 01/10/2016      Expected Discharge Date: Expected Discharge Date: 02/12/16   Team Members Present: Physician leading conference: Dr. Alger Simons Social Worker Present: Lennart Pall, LCSW Nurse Present: Heather Roberts, RN PT Present: Kem Parkinson, PT OT Present: [name], OT SLP Present: Weston Anna, SLP PPS Coordinator present : Daiva Nakayama, RN, CRRN       Current Status/Progress Goal Weekly Team Focus  Medical   pain improving but still a factor. hydro therapy for wound showin progress.  pain control  pain mgt, wound care, regulation of bowels   Bowel/Bladder   SP cath, continent bowel.  SP managed, no s/s infection. BM QD, QOD with PRNs as needed.  Monitor.   Swallow/Nutrition/ Hydration             ADL's   min A - mod A for LB self care, setup to min A for UB, max A +2 for A/P  transfers; tolerate sitting in recliner today  min A transfers, LB self care, bathing; min A for toileting on BSC  sitting tolerance in upright position, transfers in and out of bed, use of AE for LB dressing,    Mobility   maxA bed mobility and +2 A/P transfers, able to tolerate OOB <1 hour in w/c  supervision w/c level transfers; mod I w/c mobility  pain management, OOB tolerance, bed mobility, transfers   Communication             Safety/Cognition/ Behavioral Observations           Pain   Scheduled Oxy IR, occasional PRN dose at HS.  Managed at goal 3/10  Monitor, encourage alternative methods of pain control; repositioning.   Skin   Sacral wound with hydrotherapy, santyl, air mattress.  Would in healing process at DC, no new breakdown, no injuries this admission.  Monitor.     Rehab Goals Patient on target to meet rehab goals: Yes *See Care Plan and progress notes for long and short-term goals.   Barriers to Discharge: pain mgt, ortho precautions   Possible Resolutions to Barriers:  continued adjustment of analgesic regimen   Discharge Planning/Teaching Needs:  planning home with wife who can provide 24/7 assistance.  Teaching closer to d/c   Team Discussion:  Up in recliner today with w/c cushion and reclined is all he could really tolerate.  Poor tolerance for sitting in w/c with max x 1 hr.  Pt avoids any position that causes any amount of pain.  Currently heavy assistance +2 for transfers.  Is dressing at bed level with min assist.  May need to be more aggressive with pain management.  Team feels need to extend LOS x week  Revisions to Treatment Plan:  Change in d/c date to 11/1    Continued Need for Acute Rehabilitation Level of Care: The patient requires daily medical management by a physician with specialized training in physical medicine and rehabilitation for the following conditions: Daily direction of a multidisciplinary physical rehabilitation program to ensure safe  treatment while eliciting the highest outcome that is of practical value to the patient.: Yes Daily medical management of patient stability for increased activity during participation in an intensive rehabilitation regime.: Yes Daily analysis of laboratory values and/or radiology reports with any subsequent need for medication adjustment of medical intervention for : Post surgical problems;Other   Ravinder Hofland 01/29/2016, 10:14 AM      Lowella Curb, LCSW Social Worker Signed   Patient Care Conference Date of Service: 01/28/2016  4:04 PM      Hide copied text Hover for attribution information Inpatient RehabilitationTeam Conference and Plan of Care Update Date: 01/28/2016   Time: 2:10 PM      Patient Name: Christian [name]      Medical Record Number: [id number]  Date of Birth: 1941/01/28 Sex: Male         Room/Bed: 4W21C/4W21C-01 Payor Info: Payor: MEDICARE / Plan: MEDICARE PART A AND B / Product Type: *No Product type* /     Admitting Diagnosis: Pelvis FX  Admit Date/Time:  01/17/2016  4:59 PM Admission Comments: No comment available    Primary Diagnosis:  Multiple pelvic fractures (HCC) Principal Problem: Multiple pelvic fractures [facility])       Patient Active Problem List    Diagnosis Date Noted  . Sacral decubitus ulcer 01/17/2016  . Accident caused by farm tractor 01/15/2016  . Urethral injury 01/15/2016  . Acute blood loss anemia 01/15/2016  . Acute kidney injury ([place]) 01/15/2016  . Multiple pelvic fractures ([place]) 01/10/2016      Expected Discharge Date: Expected Discharge Date: 02/12/16   Team Members Present: Physician leading conference: Dr. Alger Simons Social Worker Present: Lennart Pall, LCSW Nurse Present: Heather Roberts, RN PT Present: Kem Parkinson, PT OT Present: [name], OT SLP Present: Weston Anna, SLP PPS Coordinator present : Daiva Nakayama, RN, CRRN       Current Status/Progress Goal Weekly Team Focus  Medical   pain improving but still a  factor. hydro therapy for wound showin progress.  pain control  pain mgt, wound care, regulation of bowels   Bowel/Bladder   SP cath, continent bowel.  SP managed, no s/s infection. BM QD, QOD with PRNs as needed.  Monitor.   Swallow/Nutrition/ Hydration             ADL's   min A - mod A for LB self care, setup to min A for UB, max A +2 for A/P transfers; tolerate sitting in recliner today  min A transfers, LB self care, bathing; min A for toileting on BSC  sitting tolerance in upright position, transfers in and out of bed, use of AE for LB dressing,    Mobility   maxA bed mobility and +2 A/P transfers,  able to tolerate OOB <1 hour in w/c  supervision w/c level transfers; mod I w/c mobility  pain management, OOB tolerance, bed mobility, transfers   Communication             Safety/Cognition/ Behavioral Observations           Pain   Scheduled Oxy IR, occasional PRN dose at HS.  Managed at goal 3/10  Monitor, encourage alternative methods of pain control; repositioning.   Skin   Sacral wound with hydrotherapy, santyl, air mattress.  Would in healing process at DC, no new breakdown, no injuries this admission.  Monitor.     Rehab Goals Patient on target to meet rehab goals: Yes *See Care Plan and progress notes for long and short-term goals.   Barriers to Discharge: pain mgt, ortho precautions   Possible Resolutions to Barriers:  continued adjustment of analgesic regimen   Discharge Planning/Teaching Needs:  planning home with wife who can provide 24/7 assistance.  Teaching closer to d/c   Team Discussion:  Up in recliner today with w/c cushion and reclined is all he could really tolerate.  Poor tolerance for sitting in w/c with max x 1 hr.  Pt avoids any position that causes any amount of pain.  Currently heavy assistance +2 for transfers.  Is dressing at bed level with min assist.  May need to be more aggressive with pain management.  Team feels need to extend LOS x week  Revisions to  Treatment Plan:  Change in d/c date to 11/1    Continued Need for Acute Rehabilitation Level of Care: The patient requires daily medical management by a physician with specialized training in physical medicine and rehabilitation for the following conditions: Daily direction of a multidisciplinary physical rehabilitation program to ensure safe treatment while eliciting the highest outcome that is of practical value to the patient.: Yes Daily medical management of patient stability for increased activity during participation in an intensive rehabilitation regime.: Yes Daily analysis of laboratory values and/or radiology reports with any subsequent need for medication adjustment of medical intervention for : Post surgical problems;Other   Larraine Argo, [place] 01/29/2016, 10:14 AM

## 2016-01-29 NOTE — Progress Notes (Signed)
[place] PHYSICAL MEDICINE & REHABILITATION     PROGRESS NOTE    Subjective/Complaints: Receiving hydrotherapy. Tolerating it "ok". Still having a lot of pain with functional activities.   ROS: Pt denies fever, rash/itching, headache, blurred or double vision, nausea, vomiting, abdominal pain,  chest pain, shortness of breath, palpitations, dysuria, dizziness,     Objective: Vital Signs: Blood pressure 129/64, pulse 78, temperature 97.5 F (36.4 C), temperature source Oral, resp. rate 18, height 6\' 2"  (1.88 m), weight 103.2 kg (227 lb 8.2 oz), SpO2 97 %. No results found. No results for input(s): WBC, HGB, HCT, PLT in the last 72 hours. No results for input(s): NA, K, CL, GLUCOSE, BUN, CREATININE, CALCIUM in the last 72 hours.  Invalid input(s): CO CBG (last 3)   Recent Labs  01/28/16 1624 01/28/16 2146 01/29/16 0635  GLUCAP 199* 109* 131*    Wt Readings from Last 3 Encounters:  01/29/16 103.2 kg (227 lb 8.2 oz)  01/10/16 100.7 kg (222 lb)    Physical Exam:  Constitutional: He is oriented to person, place, and time. He appears well-developed.  HENT:  Head: Normocephalicand atraumatic.  Right Ear: External earnormal.  Left Ear: External earnormal.  Eyes: Conjunctivaeand EOMare normal. Pupils are equal, round, and reactive to light.  Neck: No tracheal deviationpresent. No thyromegalypresent.  Cardiovascular: Normal rate, regular rhythmand normal heart sounds.  Respiratory: Effort normaland breath sounds normal. No respiratory distress. He has no wheezes. He has no rales.  GI: Soft. Bowel sounds are normal. He exhibits no distension. There is no tenderness. There is no rebound.  Genitourinary: No penile tenderness. SPC in place Musculoskeletal: He exhibits edema. He exhibits no deformity.  Right leg tender to basic palpation and PROM at hip and knee.  Neurological: He is alertand oriented to person, place, and time.  UE 5/5 prox to distal. RLE: 1/5 HF, KE  3/5 ADF/PF. LLE: 3- HF, 3 KE and 4/5 ADF/PF. +/- LT bilateral distal lower extremities. Cognition appropriate.  Skin: Skin is warm.  Sacral wound with exposed pink granulation tissue. Less necrotic debris. But quite a bit still present particularly around periphery of wound.  Operative incision sites clean. Suprapubic catheter site clean   Psychiatric: He has a normal mood and affect. His behavior is normal. Judgmentand thought contentnormal  Assessment/Plan: 1. Functional and mobility deficits secondary to polytrauma which require 3+ hours per day of interdisciplinary therapy in a comprehensive inpatient rehab setting. Physiatrist is providing close team supervision and 24 hour management of active medical problems listed below. Physiatrist and rehab team continue to assess barriers to discharge/monitor patient progress toward functional and medical goals.  Function:  Bathing Bathing position   Position: Bed  Bathing parts Body parts bathed by patient: Right upper leg, Left upper leg, Abdomen, Chest, Right arm, Left arm, Right lower leg, Left lower leg Body parts bathed by helper: Front perineal area, Buttocks  Bathing assist Assist Level: Touching or steadying assistance(Pt > 75%)      Upper Body Dressing/Undressing Upper body dressing   What is the patient wearing?: Pull over shirt/dress     Pull over shirt/dress - Perfomed by patient: Thread/unthread right sleeve, Thread/unthread left sleeve, Put head through opening Pull over shirt/dress - Perfomed by helper: Pull shirt over trunk        Upper body assist Assist Level: Touching or steadying assistance(Pt > 75%)      Lower Body Dressing/Undressing Lower body dressing   What is the patient wearing?: Pants, Shoes  Underwear - Performed by helper: Thread/unthread right underwear leg, Thread/unthread left underwear leg, Pull underwear up/down Pants- Performed by patient: Thread/unthread left pants leg Pants- Performed by  helper: Thread/unthread right pants leg, Pull pants up/down   Non-skid slipper socks- Performed by helper: Don/doff right sock, Don/doff left sock       Shoes - Performed by helper: Don/doff right shoe, Don/doff left shoe       TED Hose - Performed by helper: Don/doff right TED hose, Don/doff left TED hose  Lower body assist Assist for lower body dressing: Touching or steadying assistance (Pt > 75%)      Toileting Toileting     Toileting steps completed by helper: Adjust clothing prior to toileting, Adjust clothing after toileting, Performs perineal hygiene Toileting Assistive Devices: Toilet aid  Toileting assist Assist level: More than reasonable time, Set up/obtain supplies, Two helpers, Supervision or verbal cues   Transfers Chair/bed transfer Chair/bed transfer activity did not occur: Safety/medical concerns Chair/bed transfer method: Anterior/posterior Chair/bed transfer assist level: 2 helpers Chair/bed transfer assistive device: Armrests     Locomotion Ambulation Ambulation activity did not occur: Safety/medical Editor, commissioning activity did not occur: Safety/medical concerns     Assist Level:  (unable to tolerate sitting up or transfer to wc)  Cognition Comprehension Comprehension assist level: Follows complex conversation/direction with no assist  Expression Expression assist level: Expresses complex ideas: With no assist  Social Interaction Social Interaction assist level: Interacts appropriately with others - No medications needed.  Problem Solving Problem solving assist level: Solves complex problems: Recognizes & self-corrects  Memory Memory assist level: Recognizes or recalls 50 - 74% of the time/requires cueing 25 - 49% of the time   Medical Problem List and Plan: 1.  Multi pelvic fractures status post sacroiliac screw fixation secondary to trauma.              -Touchdown weightbearing right leg to facilitate transfers. CIR PT/OT              -Weightbearing as tolerated left leg for transfers only 8 weeks,   -pain still heavily affecting participation/functional mobility 2.  DVT Prophylaxis/Anticoagulation: SQ Lovenox.  -vascular study normal 3. Pain Management: Neurontin 300 mg TID  -increase fentanyl patch to 81mcg (pt and wife agree with increase)  - oxycodone prn/ with two scheduled doses to coincide with therapy  - l 4. Acute blood loss anemia.Fergon 324 mg bid. Follow-up CBC 5. Neuropsych: This patient is capable of making decisions on his own behalf. 6. Skin/Wound Care/Sacral wound: air mattress.  -wound gradually improving  -area very sensitive  -hydrotherapy helping  -collagenase to wound daily    7. Fluids/Electrolytes/Nutrition: encourage PO 8. Urethral trauma. Status post suprapubic catheter. Follow-up with changes as directed. Urine remains clear/yellow   9. Diabetes mellitus with peripheral neuropathy. Hemoglobin A1c 7.9. Levemir 15 units daily at bedtime, Glucophage 1000 mg twice a day, Glucotrol 10 mg before breakfast and 5 mg before supper.             - CBG (last 3)   Recent Labs  01/28/16 1624 01/28/16 2146 01/29/16 0635  GLUCAP 199* 109* 131*   -has had good control in general  10. Hypertension. Norvasc 7.5 mg daily. Fair control Vitals:   01/28/16 1354 01/29/16 0500  BP: (!) 158/59 129/64  Pulse: 85 78  Resp: 18 18  Temp: 97.9 F (36.6 C) 97.5 F (36.4 C)   11. Hyperlipidemia. Lipitor  12. Constipation--will add senokot s to see if we can be more proactive with his bowel movements  -avoid sorbitol  LOS (Days) 12 A FACE TO FACE EVALUATION WAS PERFORMED  Jannely Henthorn T 01/29/2016 9:41 AM

## 2016-01-30 ENCOUNTER — Inpatient Hospital Stay (HOSPITAL_COMMUNITY): Payer: Medicare Other | Admitting: Anesthesiology

## 2016-01-30 ENCOUNTER — Encounter (HOSPITAL_COMMUNITY)
Admission: RE | Disposition: A | Discharge status: Home Health | Payer: Self-pay | Source: Intra-hospital | Attending: Physical Medicine & Rehabilitation

## 2016-01-30 ENCOUNTER — Inpatient Hospital Stay (HOSPITAL_COMMUNITY): Payer: Medicare Other | Admitting: Physical Therapy

## 2016-01-30 ENCOUNTER — Ambulatory Visit (HOSPITAL_COMMUNITY): Admission: RE | Admit: 2016-01-30 | Payer: Medicare Other | Source: Ambulatory Visit | Admitting: Plastic Surgery

## 2016-01-30 ENCOUNTER — Inpatient Hospital Stay (HOSPITAL_COMMUNITY): Payer: Medicare Other | Admitting: Occupational Therapy

## 2016-01-30 ENCOUNTER — Ambulatory Visit (HOSPITAL_COMMUNITY): Payer: Medicare Other

## 2016-01-30 ENCOUNTER — Encounter (HOSPITAL_COMMUNITY): Payer: Self-pay | Admitting: Anesthesiology

## 2016-01-30 HISTORY — PX: I & D EXTREMITY: SHX5045

## 2016-01-30 LAB — CBC
HCT: 30.5 % — ABNORMAL LOW (ref 39.0–52.0)
Hemoglobin: 9.2 g/dL — ABNORMAL LOW (ref 13.0–17.0)
MCH: 27.9 pg (ref 26.0–34.0)
MCHC: 30.2 g/dL (ref 30.0–36.0)
MCV: 92.4 fL (ref 78.0–100.0)
Platelets: 400 10*3/uL (ref 150–400)
RBC: 3.3 MIL/uL — ABNORMAL LOW (ref 4.22–5.81)
RDW: 15.9 % — AB (ref 11.5–15.5)
WBC: 7.8 10*3/uL (ref 4.0–10.5)

## 2016-01-30 LAB — GLUCOSE, CAPILLARY
GLUCOSE-CAPILLARY: 113 mg/dL — AB (ref 65–99)
GLUCOSE-CAPILLARY: 153 mg/dL — AB (ref 65–99)
GLUCOSE-CAPILLARY: 245 mg/dL — AB (ref 65–99)
Glucose-Capillary: 145 mg/dL — ABNORMAL HIGH (ref 65–99)
Glucose-Capillary: 155 mg/dL — ABNORMAL HIGH (ref 65–99)

## 2016-01-30 SURGERY — IRRIGATION AND DEBRIDEMENT EXTREMITY
Anesthesia: General | Site: Buttocks

## 2016-01-30 MED ORDER — SUCCINYLCHOLINE CHLORIDE 20 MG/ML IJ SOLN
INTRAMUSCULAR | Status: DC | PRN
  Administered 2016-01-30: 100 mg via INTRAVENOUS

## 2016-01-30 MED ORDER — LACTATED RINGERS IV SOLN: INTRAVENOUS | Status: DC

## 2016-01-30 MED ORDER — ARTIFICIAL TEARS OP OINT
TOPICAL_OINTMENT | OPHTHALMIC | Status: DC | PRN
Start: 2016-01-30 — End: 2016-01-30
  Administered 2016-01-30: 1 via OPHTHALMIC

## 2016-01-30 MED ORDER — SUGAMMADEX SODIUM 200 MG/2ML IV SOLN: INTRAVENOUS | Status: DC | PRN

## 2016-01-30 MED ORDER — ROCURONIUM BROMIDE 100 MG/10ML IV SOLN
INTRAVENOUS | Status: DC | PRN
  Administered 2016-01-30: 40 mg via INTRAVENOUS

## 2016-01-30 MED ORDER — LACTATED RINGERS IV SOLN
INTRAVENOUS | Status: DC
  Administered 2016-01-30 (×2): via INTRAVENOUS

## 2016-01-30 MED ORDER — FENTANYL CITRATE (PF) 100 MCG/2ML IJ SOLN
INTRAMUSCULAR | Status: DC | PRN
  Administered 2016-01-30 (×2): 50 ug via INTRAVENOUS

## 2016-01-30 MED ORDER — PROPOFOL 10 MG/ML IV BOLUS
INTRAVENOUS | Status: DC | PRN
Start: 2016-01-30 — End: 2016-01-30
  Administered 2016-01-30: 100 mg via INTRAVENOUS

## 2016-01-30 MED ORDER — SUGAMMADEX SODIUM 200 MG/2ML IV SOLN
INTRAVENOUS | Status: DC | PRN
  Administered 2016-01-30: 205 mg via INTRAVENOUS

## 2016-01-30 MED ORDER — SODIUM CHLORIDE 0.9 % IR SOLN
Status: DC | PRN
  Administered 2016-01-30: 500 mL

## 2016-01-30 MED ORDER — ONDANSETRON HCL 4 MG/2ML IJ SOLN
INTRAMUSCULAR | Status: DC | PRN
  Administered 2016-01-30: 4 mg via INTRAVENOUS

## 2016-01-30 MED ORDER — SODIUM CHLORIDE 0.9 % IR SOLN
Status: DC | PRN
  Administered 2016-01-30: 1000 mL

## 2016-01-30 SURGICAL SUPPLY — 46 items
BANDAGE ACE 4X5 VEL STRL LF (GAUZE/BANDAGES/DRESSINGS) IMPLANT
BENZOIN TINCTURE PRP APPL 2/3 (GAUZE/BANDAGES/DRESSINGS) ×6 IMPLANT
BLADE SURG ROTATE 9660 (MISCELLANEOUS) IMPLANT
BNDG GAUZE ELAST 4 BULKY (GAUZE/BANDAGES/DRESSINGS) IMPLANT
CANISTER SUCTION 2500CC (MISCELLANEOUS) IMPLANT
CANISTER WOUND CARE 500ML ATS (WOUND CARE) ×3 IMPLANT
CHLORAPREP W/TINT 26ML (MISCELLANEOUS) IMPLANT
CONT SPEC 4OZ CLIKSEAL STRL BL (MISCELLANEOUS) ×3 IMPLANT
COVER SURGICAL LIGHT HANDLE (MISCELLANEOUS) ×3 IMPLANT
DRAPE EXTREMITY T 121X128X90 (DRAPE) IMPLANT
DRAPE INCISE IOBAN 66X45 STRL (DRAPES) IMPLANT
DRAPE ORTHO SPLIT 77X108 STRL (DRAPES)
DRAPE SURG ORHT 6 SPLT 77X108 (DRAPES) IMPLANT
DRESSING HYDROCOLLOID 4X4 (GAUZE/BANDAGES/DRESSINGS) IMPLANT
DRESSING HYDROCOLLOID 4X4 XTH (GAUZE/BANDAGES/DRESSINGS) ×3 IMPLANT
DRSG ADAPTIC 3X8 NADH LF (GAUZE/BANDAGES/DRESSINGS) IMPLANT
DRSG CUTIMED SORBACT 7X9 (GAUZE/BANDAGES/DRESSINGS) ×3 IMPLANT
DRSG PAD ABDOMINAL 8X10 ST (GAUZE/BANDAGES/DRESSINGS) IMPLANT
DRSG VAC ATS LRG SENSATRAC (GAUZE/BANDAGES/DRESSINGS) IMPLANT
DRSG VAC ATS MED SENSATRAC (GAUZE/BANDAGES/DRESSINGS) IMPLANT
DRSG VAC ATS SM SENSATRAC (GAUZE/BANDAGES/DRESSINGS) ×3 IMPLANT
ELECT REM PT RETURN 9FT ADLT (ELECTROSURGICAL) ×3
ELECTRODE REM PT RTRN 9FT ADLT (ELECTROSURGICAL) ×1 IMPLANT
GAUZE SPONGE 4X4 12PLY STRL (GAUZE/BANDAGES/DRESSINGS) IMPLANT
GEL ULTRASOUND 20GR AQUASONIC (MISCELLANEOUS) IMPLANT
GLOVE BIO SURGEON STRL SZ 6.5 (GLOVE) ×4 IMPLANT
GLOVE BIO SURGEONS STRL SZ 6.5 (GLOVE) ×2
GOWN STRL REUS W/ TWL LRG LVL3 (GOWN DISPOSABLE) ×2 IMPLANT
GOWN STRL REUS W/TWL LRG LVL3 (GOWN DISPOSABLE) ×4
HANDPIECE INTERPULSE COAX TIP (DISPOSABLE)
KIT BASIN OR (CUSTOM PROCEDURE TRAY) ×3 IMPLANT
KIT ROOM TURNOVER OR (KITS) ×3 IMPLANT
MANIFOLD NEPTUNE II (INSTRUMENTS) ×3 IMPLANT
MATRIX SURGICAL PSM 7X10CM (Tissue) ×3 IMPLANT
MICROMATRIX 1000MG (Tissue) ×3 IMPLANT
NS IRRIG 1000ML POUR BTL (IV SOLUTION) ×3 IMPLANT
PACK GENERAL/GYN (CUSTOM PROCEDURE TRAY) ×3 IMPLANT
PAD ARMBOARD 7.5X6 YLW CONV (MISCELLANEOUS) ×6 IMPLANT
PAD NEG PRESSURE SENSATRAC (MISCELLANEOUS) IMPLANT
SET HNDPC FAN SPRY TIP SCT (DISPOSABLE) IMPLANT
SOLUTION PARTIC MCRMTRX 1000MG (Tissue) ×1 IMPLANT
SUT SILK 4 0 PS 2 (SUTURE) ×3 IMPLANT
SUT VIC AB 5-0 PS2 18 (SUTURE) ×6 IMPLANT
TOWEL OR 17X24 6PK STRL BLUE (TOWEL DISPOSABLE) IMPLANT
TOWEL OR 17X26 10 PK STRL BLUE (TOWEL DISPOSABLE) ×3 IMPLANT
UNDERPAD 30X30 (UNDERPADS AND DIAPERS) IMPLANT

## 2016-01-30 NOTE — Progress Notes (Signed)
  Subjective: The patient was seen in his room after PT.  He is doing better with walking with assistance.  The wound is located on the sacrum  Objective: Vital signs in last 24 hours: Temp:  [98.3 F (36.8 C)-98.9 F (37.2 C)] 98.9 F (37.2 C) (10/19 0700) Pulse Rate:  [66-114] 66 (10/19 0700) Resp:  [18] 18 (10/19 0700) BP: (129-155)/(60-73) 129/72 (10/19 0700) SpO2:  [96 %] 96 % (10/19 0700) Weight:  [102.5 kg (225 lb 15.5 oz)] 102.5 kg (225 lb 15.5 oz) (10/19 0700) Last BM Date: 01/28/16  Intake/Output from previous day: 10/18 0701 - 10/19 0700 In: 600 [P.O.:600] Out: 2000 [Urine:2000] Intake/Output this shift: No intake/output data recorded.  General appearance: alert, cooperative and no distress Incision/Wound: Sacral wound noted.  Lab Results:   Recent Labs  01/30/16 0644  WBC 7.8  HGB 9.2*  HCT 30.5*  PLT 400   BMET No results for input(s): NA, K, CL, CO2, GLUCOSE, BUN, CREATININE, CALCIUM in the last 72 hours. PT/INR No results for input(s): LABPROT, INR in the last 72 hours. ABG No results for input(s): PHART, HCO3 in the last 72 hours.  Invalid input(s): PCO2, PO2  Studies/Results: No results found.  Anti-infectives: Anti-infectives    Start     Dose/Rate Route Frequency Ordered Stop   01/30/16 0600  ceFAZolin (ANCEF) IVPB 2g/100 mL premix     2 g 200 mL/hr over 30 Minutes Intravenous On call to O.R. 01/29/16 1934 01/31/16 0559      Assessment/Plan: s/p * No surgery found * Plan OR today for debridement and possible Acell placement to the sacral area.  Possible VAC placement.  LOS: 13 days    Wallace Going 01/30/2016

## 2016-01-30 NOTE — Transfer of Care (Signed)
Immediate Anesthesia Transfer of Care Note  Patient: EMERY DEKAM  Procedure(s) Performed: Procedure(s): IRRIGATION AND DEBRIDEMENT OF THE SACRUM (N/A)  Patient Location: PACU  Anesthesia Type:General  Level of Consciousness: awake, alert , oriented and sedated  Airway & Oxygen Therapy: Patient Spontanous Breathing and Patient connected to nasal cannula oxygen  Post-op Assessment: Report given to RN, Post -op Vital signs reviewed and stable and Patient moving all extremities  Post vital signs: Reviewed and stable  Last Vitals:  Vitals:   01/30/16 0700 01/30/16 1503  BP: 129/72   Pulse: 66   Resp: 18   Temp: 37.2 C 36.7 C    Last Pain:  Vitals:   01/30/16 1503  TempSrc:   PainSc: Asleep      Patients Stated Pain Goal: 3 ([id number])  Complications: No apparent anesthesia complications

## 2016-01-30 NOTE — Anesthesia Preprocedure Evaluation (Signed)
Anesthesia Evaluation  Patient identified by MRN, date of birth, ID band Patient awake    Reviewed: Allergy & Precautions, NPO status , Patient's Chart, lab work & pertinent test results  Airway Mallampati: II  TM Distance: >3 FB Neck ROM: Full    Dental no notable dental hx. (+) Teeth Intact, Dental Advisory Given   Pulmonary neg pulmonary ROS,    Pulmonary exam normal breath sounds clear to auscultation       Cardiovascular hypertension, Pt. on medications Normal cardiovascular exam Rhythm:Regular Rate:Normal     Neuro/Psych negative neurological ROS  negative psych ROS   GI/Hepatic negative GI ROS, Neg liver ROS,   Endo/Other  diabetes, Type 2, Insulin Dependent, Oral Hypoglycemic Agents  Renal/GU Renal disease     Musculoskeletal negative musculoskeletal ROS (+)   Abdominal   Peds negative pediatric ROS (+)  Hematology  (+) Blood dyscrasia, anemia ,   Anesthesia Other Findings   Reproductive/Obstetrics negative OB ROS                             Anesthesia Physical  Anesthesia Plan  ASA: II  Anesthesia Plan: General   Post-op Pain Management:    Induction: Intravenous  Airway Management Planned: Oral ETT  Additional Equipment:   Intra-op Plan:   Post-operative Plan: Extubation in OR  Informed Consent: I have reviewed the patients History and Physical, chart, labs and discussed the procedure including the risks, benefits and alternatives for the proposed anesthesia with the patient or authorized representative who has indicated his/her understanding and acceptance.   Dental advisory given  Plan Discussed with: CRNA  Anesthesia Plan Comments:         Anesthesia Quick Evaluation

## 2016-01-30 NOTE — Progress Notes (Signed)
Physical Therapy Session Note  Patient Details  Name: Christian [name] MRN: [id number] Date of Birth: 1941/03/06  Today's Date: 01/30/2016 PT Individual Time: 0930-1030 PT Individual Time Calculation (min): 60 min    Short Term Goals: Week 2:  PT Short Term Goal 1 (Week 2): Pt will tolerate sitting up in w/c >30 min PT Short Term Goal 2 (Week 2): Pt will perform A/P transfer with modA +2 PT Short Term Goal 3 (Week 2): Pt will perform rolling R/L with minA PT Short Term Goal 4 (Week 2): Pt will propel w/c x50' with BUE and modA  Skilled Therapeutic Interventions/Progress Updates:   Pt received supine in bed, c/o 6/10 pain in R hip/pelvis and agreeable to treatment. Supine>sit with modA +2 for LE management and pt pulling with UEs; declines pulling/pushing from bedrails. Seated on EOB x5 min with pt frequently repositioning until able to find a position of relief from pelvic pain. Sit <>stand x2 trials with modA +2, RW and pt able to maintain NWB RLE. Standing tolerance of approximately 1 min before requiring seated rest break. Returned to supine with +2A for LE management and trunk control. RLE AAROM heel slides x6, progressed to AROM x10 reps with pillow case under heel to reduce friction. LLE modified straight leg raise (heel slide to knee extension to eccentric SLR to return to bed) x10 reps. AAROM SLR RLE x10 reps. Chest press and supine tricep extensions ("skull crushers") 2x15 with 4# weighted bar. Remained supine in bed at end of session, all needs in reach.   Therapy Documentation Precautions:  Precautions Precautions: Fall Restrictions Weight Bearing Restrictions: Yes RLE Weight Bearing: Non weight bearing LLE Weight Bearing: Weight bearing as tolerated Other Position/Activity Restrictions: LLE WBAT for transfers only   See Function Navigator for Current Functional Status.   Therapy/Group: Individual Therapy  [name] 01/30/2016, 10:25 AM

## 2016-01-30 NOTE — H&P (View-Only) (Signed)
  Subjective: The patient was seen in his room after PT.  He is doing better with walking with assistance.  The wound is located on the sacrum  Objective: Vital signs in last 24 hours: Temp:  [98.3 F (36.8 C)-98.9 F (37.2 C)] 98.9 F (37.2 C) (10/19 0700) Pulse Rate:  [66-114] 66 (10/19 0700) Resp:  [18] 18 (10/19 0700) BP: (129-155)/(60-73) 129/72 (10/19 0700) SpO2:  [96 %] 96 % (10/19 0700) Weight:  [102.5 kg (225 lb 15.5 oz)] 102.5 kg (225 lb 15.5 oz) (10/19 0700) Last BM Date: 01/28/16  Intake/Output from previous day: 10/18 0701 - 10/19 0700 In: 600 [P.O.:600] Out: 2000 [Urine:2000] Intake/Output this shift: No intake/output data recorded.  General appearance: alert, cooperative and no distress Incision/Wound: Sacral wound noted.  Lab Results:   Recent Labs  01/30/16 0644  WBC 7.8  HGB 9.2*  HCT 30.5*  PLT 400   BMET No results for input(s): NA, K, CL, CO2, GLUCOSE, BUN, CREATININE, CALCIUM in the last 72 hours. PT/INR No results for input(s): LABPROT, INR in the last 72 hours. ABG No results for input(s): PHART, HCO3 in the last 72 hours.  Invalid input(s): PCO2, PO2  Studies/Results: No results found.  Anti-infectives: Anti-infectives    Start     Dose/Rate Route Frequency Ordered Stop   01/30/16 0600  ceFAZolin (ANCEF) IVPB 2g/100 mL premix     2 g 200 mL/hr over 30 Minutes Intravenous On call to O.R. 01/29/16 1934 01/31/16 0559      Assessment/Plan: s/p * No surgery found * Plan OR today for debridement and possible Acell placement to the sacral area.  Possible VAC placement.  LOS: 13 days    Wallace Going 01/30/2016

## 2016-01-30 NOTE — Progress Notes (Signed)
Occupational Therapy Session Note  Patient Details  Name: ACHINTYA TERRERO MRN: [id number] Date of Birth: 07-Jun-1940  Today's Date: 01/30/2016 OT Individual Time: 1100-1200 OT Individual Time Calculation (min): 60 min     Short Term Goals: Week 2:  OT Short Term Goal 1 (Week 2): Pt will tolerate 2 hours OOB in order to increase upright tolerance and functional positioning OT Short Term Goal 2 (Week 2): Pt will compete UB dressing in seated position (w/c or EOB) with set-up/ supervision OT Short Term Goal 3 (Week 2): Pt will complete 3 grooming tasks seated in w/c with set-up OT Short Term Goal 4 (Week 2): Pt will transfer with assist of +1 in order to decrease caregiver burden  Skilled Therapeutic Interventions/Progress Updates:   1:1 Pt reports feeling very week today due to not being able to eat and just finishing standing with PT and then not feeling well.  Agreed to continue to try to work at bed level. Focus on LEs activation against gravity in all planes and activating all mm groups to continue to work towards ability to move LEs in bed to assist with bed mobility - decreasing burden of care. Pt with increased abilty to lift right Le against gravity as well as adduct and abduct along bed. Left in bed to rest.   Therapy Documentation Precautions:  Precautions Precautions: Fall Restrictions Weight Bearing Restrictions: Yes RLE Weight Bearing: Non weight bearing LLE Weight Bearing: Weight bearing as tolerated Other Position/Activity Restrictions: LLE WBAT for transfers only Pain: Pain Assessment Pain Assessment: 0-10 Pain Score: 6  Pain Location: Buttocks Pain Orientation: Mid Pain Descriptors / Indicators: Aching Patients Stated Pain Goal: 3 Pain Intervention(s): Medication (See eMAR);Repositioned  See Function Navigator for Current Functional Status.   Therapy/Group: Individual Therapy  [name] [facility] 01/30/2016, 8:08 PM

## 2016-01-30 NOTE — Progress Notes (Signed)
Physical Therapy Note  Patient Details  Name: Christian [name] MRN: [id number] Date of Birth: [birth year] Today's Date: 01/30/2016    Per chart review, pt to go for wound debridement today. Will hold hydrotherapy this morning. Please update hydro orders after surgery if needed. Thank you.   [name], PT, DPT Acute Rehabilitation Services Pager: [phone]  01/30/2016, 8:42 AM

## 2016-01-30 NOTE — Plan of Care (Signed)
Problem: RH SKIN INTEGRITY Goal: RH STG SKIN FREE OF INFECTION/BREAKDOWN Free of further skin breakdown mod assist  Outcome: Not Progressing Patient going to surgery for wound debriment to sacrum today

## 2016-01-30 NOTE — Op Note (Addendum)
DATE OF OPERATION: 01/30/2016  LOCATION: [facility] In[facility]  PREOPERATIVE DIAGNOSIS: sacral ulcer stage III  POSTOPERATIVE DIAGNOSIS: Same  PROCEDURE:  1. Sharpe excisional debridement of sacral ulcer 7 x 10 x 3 cm 2. Placement of Acell (powder 1 gm and sheet 7 x 10 cm) 3. Placement of VAC  SURGEON: Claire H. J. Heinz, DO  ASSISTANT: Shawn Rayburn, PA  EBL: minimal  CONDITION: Stable  COMPLICATIONS: None  INDICATION: The patient, Christian [name], is a 75 y.o. male born on 1941/04/04, is here for treatment a traumatic sacral ulcer that was through skin, soft tissue and muscle.   PROCEDURE DETAILS:  The patient was seen on the morning of her surgery and marked out for her flap.  The IV antibiotics were given. The patient was taken to the operating room and given a general anesthetic. A standard time out was performed and all information was confirmed by those in the room. SCDs were placed.   The area was irrigated with antibiotic solution.  The #10 blade was used to debride the 7 x 10 cm area of skin, soft tissue and muscle.  Hemostasis was achieved with electrocautery.  All of the Acell powder and sheet was applied to the area and secured with 5-0 Vicryl.  The sorbact was secured with 4-0 Silk and a VAC was applied with KY gel.  There was an excellent seal.  The patient was allowed to wake up and taken to recovery room in stable condition at the end of the case. The family was notified at the end of the case.

## 2016-01-30 NOTE — Interval H&P Note (Signed)
History and Physical Interval Note:  01/30/2016 1:33 PM  Christian [name]  has presented today for surgery, with the diagnosis of SACRAL ULCER  The various methods of treatment have been discussed with the patient and family. After consideration of risks, benefits and other options for treatment, the patient has consented to  Procedure(s): IRRIGATION AND DEBRIDEMENT OF THE SACRUM (N/A) as a surgical intervention .  The patient's history has been reviewed, patient examined, no change in status, stable for surgery.  I have reviewed the patient's chart and labs.  Questions were answered to the patient's satisfaction.     Wallace Going

## 2016-01-30 NOTE — Anesthesia Procedure Notes (Signed)
Procedure Name: Intubation Date/Time: 01/30/2016 1:58 PM Performed by: Scheryl Darter Pre-anesthesia Checklist: Patient identified, Emergency Drugs available, Suction available and Patient being monitored Patient Re-evaluated:Patient Re-evaluated prior to inductionOxygen Delivery Method: Circle System Utilized Preoxygenation: Pre-oxygenation with 100% oxygen Intubation Type: IV induction Ventilation: Mask ventilation without difficulty Laryngoscope Size: Glidescope Grade View: Grade IV Tube type: Oral Tube size: 7.5 mm Number of attempts: 1 Airway Equipment and Method: Stylet Placement Confirmation: ETT inserted through vocal cords under direct vision,  positive ETCO2 and breath sounds checked- equal and bilateral Secured at: 23 cm Tube secured with: Tape Dental Injury: Teeth and Oropharynx as per pre-operative assessment

## 2016-01-30 NOTE — Brief Op Note (Signed)
01/17/2016 - 01/30/2016  2:03 PM  PATIENT:  Christian [name]  75 y.o. male  PRE-OPERATIVE DIAGNOSIS:  SACRAL ULCER  POST-OPERATIVE DIAGNOSIS:  SACRAL ULCER  PROCEDURE:  Procedure(s): IRRIGATION AND DEBRIDEMENT OF THE SACRUM (N/A)  SURGEON:  Surgeon(s) and Role:    * Loel Lofty Suleman Gunning, DO - Primary  PHYSICIAN ASSISTANT: Shawn Rayburn, PA  ASSISTANTS: none   ANESTHESIA:   general  EBL:  No intake/output data recorded.  BLOOD ADMINISTERED:none  DRAINS: none   LOCAL MEDICATIONS USED:  NONE  SPECIMEN:  No Specimen  DISPOSITION OF SPECIMEN:  N/A  COUNTS:  YES  TOURNIQUET:  * No tourniquets in log *  DICTATION: .Dragon Dictation  PLAN OF CARE: Admit to inpatient   PATIENT DISPOSITION:  PACU - hemodynamically stable.   Delay start of Pharmacological VTE agent (>24hrs) due to surgical blood loss or risk of bleeding: no

## 2016-01-30 NOTE — Anesthesia Postprocedure Evaluation (Signed)
Anesthesia Post Note  Patient: Christian [name]  Procedure(s) Performed: Procedure(s) (LRB): IRRIGATION AND DEBRIDEMENT OF THE SACRUM (N/A)  Patient location during evaluation: PACU Anesthesia Type: General Level of consciousness: awake and alert Pain management: pain level controlled Vital Signs Assessment: post-procedure vital signs reviewed and stable Respiratory status: spontaneous breathing, nonlabored ventilation, respiratory function stable and patient connected to nasal cannula oxygen Cardiovascular status: blood pressure returned to baseline and stable Postop Assessment: no signs of nausea or vomiting Anesthetic complications: no    Last Vitals:  Vitals:   01/30/16 1533 01/30/16 1546  BP: (!) 141/64 (!) 145/63  Pulse: 71 73  Resp: 14 16  Temp:  36.3 C    Last Pain:  Vitals:   01/30/16 1546  TempSrc: Oral  PainSc:                  [name]

## 2016-01-30 NOTE — Consult Note (Signed)
WOC follow-up: Dr Marla Roe of the plastic surgery team is now following for assessment and plan of care; pt plans to go to the OR today according to the EMR.  Please refer to their team for further questions regarding the sacral wound. Please re-consult if further assistance is needed.  Thank-you,  Julien Girt MSN, [place], [place], [place], [place]

## 2016-01-30 NOTE — Progress Notes (Signed)
[facility]     PROGRESS NOTE    Subjective/Complaints: Did better in therapy yesterday. Slept well last night.   ROS: Pt denies fever, rash/itching, headache, blurred or double vision, nausea, vomiting, abdominal pain,  chest pain, shortness of breath, palpitations, dysuria, dizziness,     Objective: Vital Signs: Blood pressure 129/72, pulse 66, temperature 98.9 F (37.2 C), temperature source Oral, resp. rate 18, height 6\' 2"  (1.88 m), weight 102.5 kg (225 lb 15.5 oz), SpO2 96 %. No results found.  Recent Labs  01/30/16 0644  WBC 7.8  HGB 9.2*  HCT 30.5*  PLT 400   No results for input(s): NA, K, CL, GLUCOSE, BUN, CREATININE, CALCIUM in the last 72 hours.  Invalid input(s): CO CBG (last 3)   Recent Labs  01/29/16 1631 01/29/16 2100 01/30/16 0700  GLUCAP 120* 80 113*    Wt Readings from Last 3 Encounters:  01/30/16 102.5 kg (225 lb 15.5 oz)  01/10/16 100.7 kg (222 lb)    Physical Exam:  Constitutional: He is oriented to person, place, and time. He appears well-developed.  HENT:  Head: Normocephalicand atraumatic.  Right Ear: External earnormal.  Left Ear: External earnormal.  Eyes: Conjunctivaeand EOMare normal. Pupils are equal, round, and reactive to light.  Neck: No tracheal deviationpresent. No thyromegalypresent.  Cardiovascular: Normal rate, regular rhythmand normal heart sounds.  Respiratory: Effort normaland breath sounds normal. No respiratory distress. He has no wheezes. He has no rales.  GI: Soft. Bowel sounds are normal. He exhibits no distension. There is no tenderness. There is no rebound.  Genitourinary: No penile tenderness. SPC in place Musculoskeletal: He exhibits edema. He exhibits no deformity.  Right leg tender to basic palpation and PROM at hip and knee.  Neurological: He is alertand oriented to person, place, and time.  UE 5/5 prox to distal. RLE: 1/5 HF, KE 3/5 ADF/PF. LLE: 3- HF, 3 KE and  4/5 ADF/PF. +/- LT bilateral distal lower extremities. Cognition appropriate.  Skin: Skin is warm.  Sacral wound with exposed pink granulation tissue. Less necrotic debris. But quite a bit still present particularly around periphery of wound.  Operative incision sites clean. Suprapubic catheter site clean   Psychiatric: He has a normal mood and affect. His behavior is normal. Judgmentand thought contentnormal  Assessment/Plan: 1. Functional and mobility deficits secondary to polytrauma which require 3+ hours per day of interdisciplinary therapy in a comprehensive inpatient rehab setting. Physiatrist is providing close team supervision and 24 hour management of active medical problems listed below. Physiatrist and rehab team continue to assess barriers to discharge/monitor patient progress toward functional and medical goals.  Function:  Bathing Bathing position   Position: Bed  Bathing parts Body parts bathed by patient: Right upper leg, Left upper leg, Abdomen, Chest, Right arm, Left arm, Right lower leg, Left lower leg Body parts bathed by helper: Front perineal area, Buttocks  Bathing assist Assist Level: Touching or steadying assistance(Pt > 75%)      Upper Body Dressing/Undressing Upper body dressing   What is the patient wearing?: Pull over shirt/dress     Pull over shirt/dress - Perfomed by patient: Thread/unthread right sleeve, Thread/unthread left sleeve, Put head through opening Pull over shirt/dress - Perfomed by helper: Pull shirt over trunk        Upper body assist Assist Level: Touching or steadying assistance(Pt > 75%)      Lower Body Dressing/Undressing Lower body dressing   What is the patient wearing?: Pants, Shoes  Underwear - Performed by helper: Thread/unthread right underwear leg, Thread/unthread left underwear leg, Pull underwear up/down Pants- Performed by patient: Thread/unthread left pants leg Pants- Performed by helper: Thread/unthread right pants  leg, Pull pants up/down   Non-skid slipper socks- Performed by helper: Don/doff right sock, Don/doff left sock       Shoes - Performed by helper: Don/doff right shoe, Don/doff left shoe       TED Hose - Performed by helper: Don/doff right TED hose, Don/doff left TED hose  Lower body assist Assist for lower body dressing: Touching or steadying assistance (Pt > 75%)      Toileting Toileting     Toileting steps completed by helper: Adjust clothing prior to toileting, Performs perineal hygiene, Adjust clothing after toileting Toileting Assistive Devices: Toilet aid  Toileting assist Assist level: Two helpers   Transfers Chair/bed transfer Chair/bed transfer activity did not occur: Safety/medical concerns Chair/bed transfer method: Stand pivot Chair/bed transfer assist level: Moderate assist (Pt 50 - 74%/lift or lower) Chair/bed transfer assistive device: Armrests     Locomotion Ambulation Ambulation activity did not occur: Safety/medical concerns         Wheelchair Wheelchair activity did not occur: Safety/medical concerns     Assist Level:  (unable to tolerate sitting up or transfer to wc)  Cognition Comprehension Comprehension assist level: Follows complex conversation/direction with no assist  Expression Expression assist level: Expresses complex ideas: With no assist  Social Interaction Social Interaction assist level: Interacts appropriately with others - No medications needed.  Problem Solving Problem solving assist level: Solves complex problems: Recognizes & self-corrects  Memory Memory assist level: Recognizes or recalls 50 - 74% of the time/requires cueing 25 - 49% of the time   Medical Problem List and Plan: 1.  Multi pelvic fractures status post sacroiliac screw fixation secondary to trauma.              -Touchdown weightbearing right leg to facilitate transfers. CIR PT/OT             -Weightbearing as tolerated left leg for transfers only 8 weeks,  2.  DVT  Prophylaxis/Anticoagulation: SQ Lovenox.  -vascular study normal 3. Pain Management: Neurontin 300 mg TID  -increased fentanyl patch to 56mcg with positive results  - oxycodone prn/ with two scheduled doses to coincide with therapy  - 4. Acute blood loss anemia.Fergon 324 mg bid. Follow-up hgb 9.2 today 5. Neuropsych: This patient is capable of making decisions on his own behalf. 6. Skin/Wound Care/Sacral wound: air mattress.  -wound gradually improving  -area very sensitive  -hydrotherapy helping  -collagenase to wound daily  -apparently to OR for further wound debridement today (nobody here knew about this until yesterday)---pt will not be able to sit on bottom post-op---will have to work around this for transfers, seating, etc.    7. Fluids/Electrolytes/Nutrition: encourage PO 8. Urethral trauma. Status post suprapubic catheter. Follow-up with changes as directed. Urine remains clear/yellow   9. Diabetes mellitus with peripheral neuropathy. Hemoglobin A1c 7.9. Levemir 15 units daily at bedtime, Glucophage 1000 mg twice a day, Glucotrol 10 mg before breakfast and 5 mg before supper.             - CBG (last 3)   Recent Labs  01/29/16 1631 01/29/16 2100 01/30/16 0700  GLUCAP 120* 80 113*   -has had good control in general  10. Hypertension. Norvasc 7.5 mg daily. Fair control Vitals:   01/29/16 1341 01/30/16 0700  BP: 134/60 129/72  Pulse: 95  66  Resp: 18 18  Temp: 98.3 F (36.8 C) 98.9 F (37.2 C)   11. Hyperlipidemia. Lipitor 12. Constipation--will add senokot s to see if we can be more proactive with his bowel movements  -avoid sorbitol  LOS (Days) 13 A FACE TO FACE EVALUATION WAS PERFORMED  Willian Donson T 01/30/2016 10:07 AM

## 2016-01-31 ENCOUNTER — Encounter (HOSPITAL_COMMUNITY): Payer: Self-pay | Admitting: Plastic Surgery

## 2016-01-31 ENCOUNTER — Inpatient Hospital Stay (HOSPITAL_COMMUNITY): Payer: Medicare Other | Admitting: Physical Therapy

## 2016-01-31 ENCOUNTER — Ambulatory Visit (HOSPITAL_COMMUNITY): Payer: Medicare Other

## 2016-01-31 ENCOUNTER — Inpatient Hospital Stay (HOSPITAL_COMMUNITY): Payer: Medicare Other | Admitting: Occupational Therapy

## 2016-01-31 LAB — GLUCOSE, CAPILLARY
GLUCOSE-CAPILLARY: 221 mg/dL — AB (ref 65–99)
GLUCOSE-CAPILLARY: 155 mg/dL — AB (ref 65–99)
Glucose-Capillary: 120 mg/dL — ABNORMAL HIGH (ref 65–99)
Glucose-Capillary: 126 mg/dL — ABNORMAL HIGH (ref 65–99)

## 2016-01-31 NOTE — Progress Notes (Signed)
Occupational Therapy Session Note  Patient Details  Name: Christian [name] MRN: [id number] Date of Birth: September 21, 1940  Today's Date: 01/31/2016 OT Individual Time: 1315-1345 OT Individual Time Calculation (min): 30 min (missed 15 minutes due to lunch)    Short Term Goals:Week 1:  OT Short Term Goal 1 (Week 1): Patient will complete upper body bathind and dressing at sink with min assist at w/c level. OT Short Term Goal 1 - Progress (Week 1): Not met OT Short Term Goal 2 (Week 1): Patient will complete SPT from w/c to toilet with mod assist OT Short Term Goal 2 - Progress (Week 1): Not met OT Short Term Goal 3 (Week 1): Pt will perform bed mobility using DME/AE, prn, with mod assist to manage BLE OT Short Term Goal 3 - Progress (Week 1): Not met OT Short Term Goal 4 (Week 1): Patient will don pants with max assist to stand supported to pull up pants. OT Short Term Goal 4 - Progress (Week 1): Not met OT Short Term Goal 5 (Week 1): Patient will complete lower body bathing at bed level with mod assist to wash buttocks and lower legs using AE, prn. OT Short Term Goal 5 - Progress (Week 1): Met Week 2:  OT Short Term Goal 1 (Week 2): Pt will tolerate 2 hours OOB in order to increase upright tolerance and functional positioning OT Short Term Goal 2 (Week 2): Pt will compete UB dressing in seated position (w/c or EOB) with set-up/ supervision OT Short Term Goal 3 (Week 2): Pt will complete 3 grooming tasks seated in w/c with set-up OT Short Term Goal 4 (Week 2): Pt will transfer with assist of +1 in order to decrease caregiver burden  Skilled Therapeutic Interventions/Progress Updates:    Pt stated he was asleep when lunch arrived and he had already started eating it at 1300. Pt requested to start therapy in 15 min.  Pt agreeable to trying sit to stand. Pt came to EOB with slight A to move legs to edge of bed. He then used 3 rails to pull into sitting.  From EOB, used RW to push up with BUE with only  min A to stand. Maintained NWB on RLE and practiced actively swinging R leg while standing for 90 sec.  Pt stated he did not want to do that again right now and needed mod A back into bed to lift legs. From supine, worked on AROM of leg add/add of B legs, one at a time. Pt's hip flexors felt too sore to do bicycle exercise, so placed 2 folded pillows under thigh and pt worked on AROM of knee extension with isometric holds 12x on each leg.  Pt tried to lie on R side but the wt pressure was too painful for his hip.  Instead moved to left side, pillows placed between legs and behind back to hold position.  Pt resting with all needs met.    Therapy Documentation Precautions:  Precautions Precautions: Fall Restrictions Weight Bearing Restrictions: Yes RLE Weight Bearing: Non weight bearing LLE Weight Bearing: Weight bearing as tolerated Other Position/Activity Restrictions: LLE WBAT for transfers only    Vital Signs: Therapy Vitals Temp: 97.7 F (36.5 C) Temp Source: Oral Pulse Rate: 78 Resp: 20 BP: 130/63 Patient Position (if appropriate): Lying Oxygen Therapy SpO2: 95 % O2 Device: Not Delivered Pain: Pain Assessment Pain Assessment: 0-10 Pain Score: 5  Pain Location: Pelvis Pain Orientation: Right Pain Descriptors / Indicators: Aching Patients Stated Pain Goal:  3 Pain Intervention(s): Medication (See eMAR) ADL: ADL ADL Comments: see Functional Assessment  See Function Navigator for Current Functional Status.   Therapy/Group: Individual Therapy  Christian [name] 01/31/2016, 9:06 AM

## 2016-01-31 NOTE — Progress Notes (Signed)
Physical Therapy Weekly Progress Note  Patient Details  Name: Christian [name] MRN: [id number] Date of Birth: [birth year]  Beginning of progress report period: January 24, 2016 End of progress report period: January 31, 2016  Today's Date: 01/31/2016 PT Individual Time: 1430-1530 PT Individual Time Calculation (min): 60 min    Patient has met 3 of 4 short term goals.  Patient has demonstrated significant improvements in mobility and improved pain control over the past three days. Pt progressing with bed mobility, however still requires +2A due to pain and anxiety with movement. Pt is performing sit <>stand with modA and RW, and stand pivot transfers with minA overall with RW. Out of bed tolerance limited by new restrictions on sitting tolerance due to sacral wound, and further mobility including gait contraindicated based on weight bearing precautions limited to LLE WBAT for transfers only.   Patient continues to demonstrate the following deficits:  activity tolerance, balance, postural control, ability to compensate for deficits, functional use of  right lower extremity and left lower extremity and knowledge of precautions and therefore will continue to benefit from skilled PT intervention to enhance overall performance with bed mobility, transfers, w/c management, home and community access.  Patient progressing toward long term goals..  Continue plan of care.  PT Short Term Goals Week 2:  PT Short Term Goal 1 (Week 2): Pt will tolerate sitting up in w/c >30 min PT Short Term Goal 1 - Progress (Week 2): Met PT Short Term Goal 2 (Week 2): Pt will perform A/P transfer with modA +2 PT Short Term Goal 2 - Progress (Week 2): Other (comment) (Performed stand pivot modA +1) PT Short Term Goal 3 (Week 2): Pt will perform rolling R/L with minA PT Short Term Goal 3 - Progress (Week 2): Met PT Short Term Goal 4 (Week 2): Pt will propel w/c x50' with BUE and modA PT Short Term Goal 4 - Progress (Week 2):  Progressing toward goal (not major focus of sessions) Week 3:  PT Short Term Goal 1 (Week 3): Pt will perform bed mobility minA PT Short Term Goal 2 (Week 3): Pt will perform stand pivot transfer with minA PT Short Term Goal 3 (Week 3): Pt will propel w/c x50' with BUE and minA PT Short Term Goal 4 (Week 3): Pt will demonstrate performance of LE strengthening/ROM HEP with modI   Skilled Therapeutic Interventions/Progress Updates:   Pt received supine in bed, c/o pain as below as well as ongoing fatigue after procedure yesterday, but agreeable to treatment. Performed supine>sit with modA overall, improving management of RLE over EOB, however requires assist for pulling trunk up to sitting d/t pain. Sit >stand modA +1 and RW. Standing tolerance of 2 min while maintaining NWB RLE. Returned to sitting due to fatigue and pt reports feeling like LE's are going to "give out". Pt declines additional attempts to stand, requesting to lay back down. Assisted to supine with modA for LE management, and heavy reliance on UEs for repositioning. LE PROM and strengthening exercises performed 2x10 each, and pt given handout with written directions/pictures to allow for performance over the weekend. Educated pt in performance of AAROM with family assisting for exercises he cannot perform AROM alone. Pt remained in L sidelying with all needs in reach at end of session.   Therapy Documentation Precautions:  Precautions Precautions: Fall Restrictions Weight Bearing Restrictions: Yes RLE Weight Bearing: Non weight bearing LLE Weight Bearing: Weight bearing as tolerated Other Position/Activity Restrictions: LLE WBAT for transfers  only Pain: Pain Assessment Pain Assessment: 0-10 Pain Score: 7  Pain Location: Pelvis Pain Orientation: Right;Left Pain Descriptors / Indicators: Aching Patients Stated Pain Goal: 4 Pain Intervention(s): Medication (See eMAR);Repositioned  See Function Navigator for Current Functional  Status.  Therapy/Group: Individual Therapy  [name] 01/31/2016, 3:32 PM

## 2016-01-31 NOTE — Plan of Care (Signed)
Problem: RH Tub/Shower Transfers Goal: LTG Patient will perform tub/shower transfers w/assist (OT) LTG: Patient will perform tub/shower transfers with assist, with/without cues using equipment (OT)  D/c goal at this time due to [facility]

## 2016-01-31 NOTE — Progress Notes (Signed)
Occupational Therapy Session Note  Patient Details  Name: Christian [name] MRN: [id number] Date of Birth: 11-Mar-1941  Today's Date: 01/31/2016 OT Individual Time: 3716-9678 OT Individual Time Calculation (min): 60 min     Short Term Goals: Week 2:  OT Short Term Goal 1 (Week 2): Pt will tolerate 2 hours OOB in order to increase upright tolerance and functional positioning OT Short Term Goal 2 (Week 2): Pt will compete UB dressing in seated position (w/c or EOB) with set-up/ supervision OT Short Term Goal 3 (Week 2): Pt will complete 3 grooming tasks seated in w/c with set-up OT Short Term Goal 4 (Week 2): Pt will transfer with assist of +1 in order to decrease caregiver burden  Skilled Therapeutic Interventions/Progress Updates:   1:1 Pt in bed asleep when I arrived but easily awakened. Focus on bed mobility to come to EOB to perform bathing and dressing tasks with bed still inflated. Pt able to come to EOB with min A with 3 bed rails and extra time and encouragement. Pt able to sit EOB with left Ue supported to get off right hip with supervision. Engaged in bathing and dressing with extra time. Pt did need A with bathing right arm due to needing to use left hand for support to remain off hip. Focus also on trying to thread pants with reacher in sitting up position (requiring more A in this position today). Two sit to stands with mod A +2 and standing balance with steadying A for washing bottom and for clothing management (pants up over hips). Return to supine to perform "air bike exercises" with A against gravity.  Therapy Documentation Precautions:  Precautions Precautions: Fall Restrictions Weight Bearing Restrictions: Yes RLE Weight Bearing: Non weight bearing LLE Weight Bearing: Weight bearing as tolerated Other Position/Activity Restrictions: LLE WBAT for transfers only Pain: Pain Assessment Pain Assessment: 0-10 Pain Score: 5  Pain Location: Pelvis Pain Orientation: Right Pain  Descriptors / Indicators: Aching Patients Stated Pain Goal: 3 Pain Intervention(s): Medication (See eMAR)  See Function Navigator for Current Functional Status.  Occupational Therapy Weekly Progress Note  Patient Details  Name: Christian [name] MRN: [id number] Date of Birth: [birth year]  Beginning of progress report period: January 26, 2016 End of progress report period: January 31, 2016     Patient has met 2 of 4 short term goals.  Pt able to progress to sit to stands this week with RW with mod A +2 for security and able to perform stand pivot transfer with Rw while maintaining weight bearing status. Pt able to come to EOB with min to mod A with extra time with bed rails. Pt now has a wound VAC and is not recommended to sit up out of bed in a chair for any length of time. Pt needs to sit in inflated bed. Pt juts begun today working on sitting EOB to perform bathing and dressing.   Patient continues to demonstrate the following deficits: muscle weakness and acute pain, decreased cardiorespiratoy endurance and decreased sitting balance, decreased standing balance, decreased postural control, decreased balance strategies and difficulty maintaining precautions and therefore will continue to benefit from skilled OT intervention to enhance overall performance with BADL and Reduce care partner burden.  Patient progressing toward long term goals..  Continue plan of care.  OT Short Term Goals Week 2:  OT Short Term Goal 1 (Week 2): Pt will tolerate 2 hours OOB in order to increase upright tolerance and functional positioning OT Short Term Goal 1 -  Progress (Week 2): Not met OT Short Term Goal 2 (Week 2): Pt will compete UB dressing in seated position (w/c or EOB) with set-up/ supervision OT Short Term Goal 2 - Progress (Week 2): Progressing toward goal OT Short Term Goal 3 (Week 2): Pt will complete 3 grooming tasks seated in w/c with set-up OT Short Term Goal 3 - Progress (Week 2): Progressing  toward goal OT Short Term Goal 4 (Week 2): Pt will transfer with assist of +1 in order to decrease caregiver burden OT Short Term Goal 4 - Progress (Week 2): Progressing toward goal Week 3:  OT Short Term Goal 1 (Week 3): LTG=STG   Skilled Therapeutic Interventions/Progress Updates:   continue with POC  Therapy Documentation Precautions:  Precautions Precautions: Fall Restrictions Weight Bearing Restrictions: Yes RLE Weight Bearing: Non weight bearing LLE Weight Bearing: Weight bearing as tolerated Other Position/Activity Restrictions: LLE WBAT for transfers only General: General OT Amount of Missed Time: 15 Minutes Vital Signs: Therapy Vitals Temp: 98.7 F (37.1 C) Temp Source: Oral Pulse Rate: 81 Resp: 18 BP: (!) 127/53 Patient Position (if appropriate): Lying Oxygen Therapy SpO2: 93 % O2 Device: Not Delivered Pain: Pain Assessment Pain Assessment: 0-10 Pain Score: 7  Pain Location: Pelvis Pain Orientation: Right;Left Pain Descriptors / Indicators: Aching Patients Stated Pain Goal: 4 Pain Intervention(s): Medication (See eMAR);Repositioned ADL: ADL ADL Comments: see Functional Assessment  See Function Navigator for Current Functional Status.   Therapy/Group: Individual Therapy  [name] [facility] 01/31/2016, 3:38 PM    Therapy/Group: Individual Therapy  [name] [facility] 01/31/2016, 10:25 AM

## 2016-01-31 NOTE — Progress Notes (Signed)
Hydrotherapy Discharge  Noted patient underwent surgery 01/30/16 with placement of VAC dressing. No new hydrotherapy orders for post-op entered. Spoke with Neoma Laming, RN re: plan. No indication for hydrotherapy at this time. Will discontinue.   01/31/2016 Barry Brunner, PT Pager: [phone]

## 2016-01-31 NOTE — Progress Notes (Signed)
[facility] PHYSICAL MEDICINE & REHABILITATION     PROGRESS NOTE    Subjective/Complaints: Able to sleep. Sore from I&D. Vac in place without issue.   ROS: Pt denies fever, rash/itching, headache, blurred or double vision, nausea, vomiting, abdominal pain,  chest pain, shortness of breath, palpitations, dysuria, dizziness,     Objective: Vital Signs: Blood pressure 130/63, pulse 78, temperature 97.7 F (36.5 C), temperature source Oral, resp. rate 20, height 6\' 2"  (1.88 m), weight 102.5 kg (225 lb 15.5 oz), SpO2 95 %. No results found.  Recent Labs  01/30/16 0644  WBC 7.8  HGB 9.2*  HCT 30.5*  PLT 400   No results for input(s): NA, K, CL, GLUCOSE, BUN, CREATININE, CALCIUM in the last 72 hours.  Invalid input(s): CO CBG (last 3)   Recent Labs  01/30/16 1637 01/30/16 2028 01/31/16 0634  GLUCAP 153* 245* 120*    Wt Readings from Last 3 Encounters:  01/30/16 102.5 kg (225 lb 15.5 oz)  01/10/16 100.7 kg (222 lb)    Physical Exam:  Constitutional: He is oriented to person, place, and time. He appears well-developed.  HENT:  Head: Normocephalicand atraumatic.  Right Ear: External earnormal.  Left Ear: External earnormal.  Eyes: Conjunctivaeand EOMare normal. Pupils are equal, round, and reactive to light.  Neck: No tracheal deviationpresent. No thyromegalypresent.  Cardiovascular: Normal rate, regular rhythmand normal heart sounds.  Respiratory: Effort normaland breath sounds normal. No respiratory distress. He has no wheezes. He has no rales.  GI: Soft. Bowel sounds are normal. He exhibits no distension. There is no tenderness. There is no rebound.  Genitourinary: No penile tenderness. SPC in place Musculoskeletal: He exhibits edema. He exhibits no deformity.  Right leg tender to basic palpation and PROM at hip and knee.  Neurological: He is alertand oriented to person, place, and time.  UE 5/5 prox to distal. RLE: 1/5 HF, KE 3/5 ADF/PF. LLE: 3 HF, 3 KE  and 4/5 ADF/PF.  Intact LT bilateral distal lower extremities. Cognition appropriate.  Skin: Skin is warm.  Sacral wound covered with vac/sealed. Suprapubic catheter site clean   Psychiatric: He has a normal mood and affect. His behavior is normal. Judgmentand thought contentnormal  Assessment/Plan: 1. Functional and mobility deficits secondary to polytrauma which require 3+ hours per day of interdisciplinary therapy in a comprehensive inpatient rehab setting. Physiatrist is providing close team supervision and 24 hour management of active medical problems listed below. Physiatrist and rehab team continue to assess barriers to discharge/monitor patient progress toward functional and medical goals.  Function:  Bathing Bathing position   Position: Bed  Bathing parts Body parts bathed by patient: Right upper leg, Left upper leg, Abdomen, Chest, Right arm, Left arm, Right lower leg, Left lower leg Body parts bathed by helper: Front perineal area, Buttocks  Bathing assist Assist Level: Touching or steadying assistance(Pt > 75%)      Upper Body Dressing/Undressing Upper body dressing   What is the patient wearing?: Pull over shirt/dress     Pull over shirt/dress - Perfomed by patient: Thread/unthread right sleeve, Thread/unthread left sleeve, Put head through opening Pull over shirt/dress - Perfomed by helper: Pull shirt over trunk        Upper body assist Assist Level: Touching or steadying assistance(Pt > 75%)      Lower Body Dressing/Undressing Lower body dressing   What is the patient wearing?: Pants, Shoes   Underwear - Performed by helper: Thread/unthread right underwear leg, Thread/unthread left underwear leg, Pull underwear up/down  Pants- Performed by patient: Thread/unthread left pants leg Pants- Performed by helper: Thread/unthread right pants leg, Pull pants up/down   Non-skid slipper socks- Performed by helper: Don/doff right sock, Don/doff left sock       Shoes  - Performed by helper: Don/doff right shoe, Don/doff left shoe       TED Hose - Performed by helper: Don/doff right TED hose, Don/doff left TED hose  Lower body assist Assist for lower body dressing: Touching or steadying assistance (Pt > 75%)      Toileting Toileting     Toileting steps completed by helper: Adjust clothing prior to toileting, Performs perineal hygiene, Adjust clothing after toileting Toileting Assistive Devices: Toilet aid  Toileting assist Assist level: Two helpers   Transfers Chair/bed transfer Chair/bed transfer activity did not occur: Safety/medical concerns Chair/bed transfer method: Stand pivot Chair/bed transfer assist level: Moderate assist (Pt 50 - 74%/lift or lower) Chair/bed transfer assistive device: Armrests     Locomotion Ambulation Ambulation activity did not occur: Safety/medical concerns         Wheelchair Wheelchair activity did not occur: Safety/medical concerns     Assist Level:  (unable to tolerate sitting up or transfer to wc)  Cognition Comprehension Comprehension assist level: Follows complex conversation/direction with no assist  Expression Expression assist level: Expresses complex ideas: With no assist  Social Interaction Social Interaction assist level: Interacts appropriately with others - No medications needed.  Problem Solving Problem solving assist level: Solves complex problems: Recognizes & self-corrects  Memory Memory assist level: Recognizes or recalls 90% of the time/requires cueing < 10% of the time   Medical Problem List and Plan: 1.  Multi pelvic fractures status post sacroiliac screw fixation secondary to trauma.              -Touchdown weightbearing right leg to facilitate transfers. CIR PT/OT             -Weightbearing as tolerated left leg for transfers only 8 weeks,  2.  DVT Prophylaxis/Anticoagulation: SQ Lovenox.  -vascular study normal 3. Pain Management: Neurontin 300 mg TID  -increased fentanyl patch to  27mcg with positive results  - oxycodone prn/ with two scheduled doses to coincide with therapy  - 4. Acute blood loss anemia.Fergon 324 mg bid. Follow-up hgb 9.2 today 5. Neuropsych: This patient is capable of making decisions on his own behalf. 6. Skin/Wound Care/Sacral wound: air mattress.  -s/p I&D, placement of Acell 10/19 by Dr. Migdalia Dk. Vac in place   -pt to be off wound unless he's on air mattress 7. Fluids/Electrolytes/Nutrition: encourage PO 8. Urethral trauma. Status post suprapubic catheter. Follow-up with changes as directed. Urine remains clear/yellow   9. Diabetes mellitus with peripheral neuropathy. Hemoglobin A1c 7.9. Levemir 15 units daily at bedtime, Glucophage 1000 mg twice a day, Glucotrol 10 mg before breakfast and 5 mg before supper.             - CBG (last 3)   Recent Labs  01/30/16 1637 01/30/16 2028 01/31/16 0634  GLUCAP 153* 245* 120*   -has had good control in general  -treat any spikes with SSI  10. Hypertension. Norvasc 7.5 mg daily. Fair control Vitals:   01/30/16 1546 01/31/16 0540  BP: (!) 145/63 130/63  Pulse: 73 78  Resp: 16 20  Temp: 97.4 F (36.3 C) 97.7 F (36.5 C)   11. Hyperlipidemia. Lipitor 12. Constipation--will add senokot s to see if we can be more proactive with his bowel movements  -avoid sorbitol  LOS (Days) 14 A FACE TO FACE EVALUATION WAS PERFORMED  Jabri Blancett T 01/31/2016 9:29 AM

## 2016-02-01 ENCOUNTER — Inpatient Hospital Stay (HOSPITAL_COMMUNITY): Payer: Medicare Other | Admitting: Occupational Therapy

## 2016-02-01 ENCOUNTER — Inpatient Hospital Stay (HOSPITAL_COMMUNITY): Payer: Medicare Other | Admitting: Physical Therapy

## 2016-02-01 DIAGNOSIS — K5903 Drug induced constipation: Secondary | ICD-10-CM

## 2016-02-01 DIAGNOSIS — G8918 Other acute postprocedural pain: Secondary | ICD-10-CM

## 2016-02-01 DIAGNOSIS — E1142 Type 2 diabetes mellitus with diabetic polyneuropathy: Secondary | ICD-10-CM

## 2016-02-01 DIAGNOSIS — I10 Essential (primary) hypertension: Secondary | ICD-10-CM

## 2016-02-01 LAB — GLUCOSE, CAPILLARY
GLUCOSE-CAPILLARY: 120 mg/dL — AB (ref 65–99)
GLUCOSE-CAPILLARY: 66 mg/dL (ref 65–99)
Glucose-Capillary: 180 mg/dL — ABNORMAL HIGH (ref 65–99)
Glucose-Capillary: 90 mg/dL (ref 65–99)
Glucose-Capillary: 76 mg/dL (ref 65–99)

## 2016-02-01 MED ORDER — INSULIN DETEMIR 100 UNIT/ML ~~LOC~~ SOLN
7.0000 [IU] | Freq: Once | SUBCUTANEOUS | Status: AC
  Filled 2016-02-01: qty 0.07

## 2016-02-01 NOTE — Significant Event (Signed)
Hypoglycemic Event  CBG: 66  Treatment: 15 GM carbohydrate snack  Symptoms: None  Follow-up CBG: Time:2130 CBG Result:76  Possible Reasons for Event: Unknown  Comments/MD notified Treated per protocol. Dr. Posey Pronto made aware    Cornell Barman

## 2016-02-01 NOTE — Progress Notes (Signed)
Physical Therapy Session Note  Patient Details  Name: Christian [name] MRN: [id number] Date of Birth: [birth year]  Today's Date: 02/01/2016 PT Individual Time: 1610-1635 PT Individual Time Calculation (min): 25 min    Short Term Goals: Week 3:  PT Short Term Goal 1 (Week 3): Pt will perform bed mobility minA PT Short Term Goal 2 (Week 3): Pt will perform stand pivot transfer with minA PT Short Term Goal 3 (Week 3): Pt will propel w/c x50' with BUE and minA PT Short Term Goal 4 (Week 3): Pt will demonstrate performance of LE strengthening/ROM HEP with modI Week 4:     Skilled Therapeutic Interventions/Progress Updates:     Patient received supine in bed and agreeable to bed level PT. Patient declined transfer training Due to fatigue from OOB activity earler in the day.   Supine therex: all therex completed x 10 with mod cues for proper ROM, improved control with eccentric movement and min cues to reduce WB through RLE.  Heel slides with AROM on the LLE and AAROM on the RLE,  Hip abduction/adduction AROM on the LLE and AAROM on the RLE SLR AAROM BLE with increased assistance on the RLE.  glute sets BLE.  Bridge with LLE   Patient left supine in bed with call bell in reach and all needs met.     Therapy Documentation Precautions:  Precautions Precautions: Fall Restrictions Weight Bearing Restrictions: Yes RLE Weight Bearing: Non weight bearing LLE Weight Bearing: Weight bearing as tolerated Other Position/Activity Restrictions: LLE WBAT for transfers only General:   Vital Signs: Therapy Vitals Temp: 98.5 F (36.9 C) Temp Source: Oral Pulse Rate: 86 Resp: 20 BP: 138/65 Patient Position (if appropriate): Lying Oxygen Therapy SpO2: 98 % O2 Device: Not Delivered   See Function Navigator for Current Functional Status.   Therapy/Group: Individual Therapy  Lorie Phenix 02/01/2016, 6:29 PM

## 2016-02-01 NOTE — Progress Notes (Signed)
Occupational Therapy Session Note  Patient Details  Name: Christian [name] MRN: [id number] Date of Birth: 20-Nov-1940  Today's Date: 02/01/2016 OT Individual Time: 0800-0900 OT Individual Time Calculation (min): 60 min     Short Term Goals:Week 3:  OT Short Term Goal 1 (Week 3): LTG=STG  Skilled Therapeutic Interventions/Progress Updates:    Pt seen this session to facilitate functional mobility and tolerance to movement in LEs.  Pt received in bed supine. Discussed need to stay in sidelying. Pt states he rolls back during the night. Brought in foam wedge for pt to use behind back to A maintaining position. Sat to EOB with min A to adjust RLE. Pt with increased pain/discomfort on R hip so pt leaning on L hip using L arm for support. During UB bathing , pt asked for A to wash R arm as he could not tolerate lifting L arm. Later he was able to tolerate briefly to don shirt.  Sit to stand 4x with RW with steadying A during LB self care of bathing and dressing. On last stand, pt stood for 1 1/2 min and then wanted to lay back. From supine, worked on LE AROM of knee extensions with ankle flex/ext with 2 folded pillows under thighs. Active R knee/hip flex/extend with heel slides on bed with A to maintain neutral alignment due to weak adductors. With knee flexed, active adduction toward midline of 2-3 inches. Pt stated with increased reps, he was able to tolerate the movement better. Pt set up to eat his breakfast in bed with HOB elevated. Wedge used along R leg to prevent external rotation.  Pt reminded to ask for assist to get into sidelying after breakfast. Pt in bed with all needs met.   Therapy Documentation Precautions:  Precautions Precautions: Fall Restrictions Weight Bearing Restrictions: Yes RLE Weight Bearing: Non weight bearing LLE Weight Bearing: Weight bearing as tolerated Other Position/Activity Restrictions: LLE WBAT for transfers only    Vital Signs: Therapy Vitals Temp: 97.6 F  (36.4 C) Temp Source: Oral Pulse Rate: 70 Resp: 18 BP: (!) 135/58 Patient Position (if appropriate): Lying Oxygen Therapy SpO2: 97 % O2 Device: Not Delivered   Pain:  c/o R hip during bed mobility only ADL: ADL ADL Comments: see Functional Assessment  See Function Navigator for Current Functional Status.   Therapy/Group: Individual Therapy  SAGUIER,JULIA 02/01/2016, 8:02 AM

## 2016-02-01 NOTE — Progress Notes (Signed)
[place] PHYSICAL MEDICINE & REHABILITATION     PROGRESS NOTE    Subjective/Complaints: Pt sitting up in bed this AM.  He slept well overnight.    ROS: Denies CP, SOB, N/V/D.   Objective: Vital Signs: Blood pressure (!) 147/71, pulse 70, temperature 97.6 F (36.4 C), temperature source Oral, resp. rate 18, height 6\' 2"  (1.88 m), weight 102.5 kg (225 lb 15.5 oz), SpO2 97 %. No results found.  Recent Labs  01/30/16 0644  WBC 7.8  HGB 9.2*  HCT 30.5*  PLT 400   No results for input(s): NA, K, CL, GLUCOSE, BUN, CREATININE, CALCIUM in the last 72 hours.  Invalid input(s): CO CBG (last 3)   Recent Labs  01/31/16 2006 02/01/16 0648 02/01/16 1148  GLUCAP 155* 120* 180*    Wt Readings from Last 3 Encounters:  01/30/16 102.5 kg (225 lb 15.5 oz)  01/10/16 100.7 kg (222 lb)    Physical Exam:  Constitutional: He appears well-developed. NAD. HENT: Normocephalicand atraumatic.  Eyes: EOMare normal. No discharge.  Cardiovascular: Normal rate, regular rhythmand normal heart sounds.  Respiratory: Effort normaland breath sounds normal. No respiratory distress. He has no wheezes. He has no rales.  GI: Soft. Bowel sounds are normal. He exhibits no distension. There is no tenderness.  Genitourinary: +SPC in place Musculoskeletal: He exhibits edema. Right leg tender to palpation. Neurological: He is alertand oriented.  UE 5/5 prox to distal.  RLE: 1/5 HF, KE 3/5 ADF/PF.  LLE: 3/5 HF, 3/5 KE and 4/5 ADF/PF.   Cognition appropriate.  Skin: Skin is warm. Sacral wound covered with vac without leaks. Suprapubic catheter site clean   Psychiatric: He has a normal mood and affect. His behavior is normal. Judgmentand thought contentnormal  Assessment/Plan: 1. Functional and mobility deficits secondary to polytrauma which require 3+ hours per day of interdisciplinary therapy in a comprehensive inpatient rehab setting. Physiatrist is providing close team supervision and 24 hour  management of active medical problems listed below. Physiatrist and rehab team continue to assess barriers to discharge/monitor patient progress toward functional and medical goals.  Function:  Bathing Bathing position   Position: Sitting EOB  Bathing parts Body parts bathed by patient: Right upper leg, Left upper leg, Abdomen, Chest, Left arm, Right lower leg, Left lower leg Body parts bathed by helper: Front perineal area, Buttocks, Back, Right arm  Bathing assist Assist Level: Touching or steadying assistance(Pt > 75%)      Upper Body Dressing/Undressing Upper body dressing   What is the patient wearing?: Pull over shirt/dress     Pull over shirt/dress - Perfomed by patient: Thread/unthread right sleeve, Thread/unthread left sleeve, Put head through opening, Pull shirt over trunk Pull over shirt/dress - Perfomed by helper: Pull shirt over trunk        Upper body assist Assist Level: Touching or steadying assistance(Pt > 75%)      Lower Body Dressing/Undressing Lower body dressing   What is the patient wearing?: Pants, Ted Hose, Non-skid slipper socks   Underwear - Performed by helper: Thread/unthread left underwear leg, Pull underwear up/down, Thread/unthread right underwear leg Pants- Performed by patient: Thread/unthread left pants leg Pants- Performed by helper: Thread/unthread right pants leg, Pull pants up/down, Thread/unthread left pants leg   Non-skid slipper socks- Performed by helper: Don/doff right sock, Don/doff left sock       Shoes - Performed by helper: Don/doff right shoe, Don/doff left shoe       TED Hose - Performed by helper: Don/doff right  TED hose, Don/doff left TED hose  Lower body assist Assist for lower body dressing: Touching or steadying assistance (Pt > 75%)      Toileting Toileting Toileting activity did not occur: No continent bowel/bladder event   Toileting steps completed by helper: Adjust clothing prior to toileting, Performs perineal  hygiene, Adjust clothing after toileting Toileting Assistive Devices: Toilet aid  Toileting assist Assist level: Two helpers   Transfers Chair/bed transfer Chair/bed transfer activity did not occur: Safety/medical concerns Chair/bed transfer method: Stand pivot Chair/bed transfer assist level: Moderate assist (Pt 50 - 74%/lift or lower) Chair/bed transfer assistive device: Armrests     Locomotion Ambulation Ambulation activity did not occur: Safety/medical concerns         Wheelchair Wheelchair activity did not occur: Safety/medical concerns     Assist Level:  (unable to tolerate sitting up or transfer to wc)  Cognition Comprehension Comprehension assist level: Follows complex conversation/direction with no assist  Expression Expression assist level: Expresses complex ideas: With no assist  Social Interaction Social Interaction assist level: Interacts appropriately with others - No medications needed.  Problem Solving Problem solving assist level: Solves complex problems: Recognizes & self-corrects  Memory Memory assist level: Recognizes or recalls 90% of the time/requires cueing < 10% of the time   Medical Problem List and Plan: 1.  Multi pelvic fractures status post sacroiliac screw fixation secondary to trauma.   -CIR PT/OT             -Touchdown weightbearing right leg to facilitate transfers.              -Weightbearing as tolerated left leg for transfers only 8 weeks,  2.  DVT Prophylaxis/Anticoagulation: SQ Lovenox.  -vascular study normal 3. Pain Management: Neurontin 300 mg TID  -increased fentanyl patch to 44mcg with positive results  -oxycodone prn/ with two scheduled doses to coincide with therapy 4. Acute blood loss anemia.  FeSo4 324 mg bid.   Follow-up hgb 9.2 5. Neuropsych: This patient is capable of making decisions on his own behalf. 6. Skin/Wound Care/Sacral wound: air mattress.  -s/p I&D, placement of Acell 10/19 by Dr. Migdalia Dk. Vac in place   -pt to be  off wound unless he's on air mattress 7. Fluids/Electrolytes/Nutrition: encourage PO 8. Urethral trauma. Status post suprapubic catheter. Follow-up with changes as directed. Urine remains clear/yellow   9. Diabetes mellitus with peripheral neuropathy. Hemoglobin A1c 7.9.  Levemir 15 units daily at bedtime  Glucophage 1000 mg twice a day  Glucotrol 10 mg before breakfast and 5 mg before supper.  Slightly labile, will cont to monitor  CBG (last 3)   Recent Labs  01/31/16 2006 02/01/16 0648 02/01/16 1148  GLUCAP 155* 120* 180*   -treat any spikes with SSI  10. Hypertension. Norvasc 7.5 mg daily.   Fair control Vitals:   02/01/16 0522 02/01/16 0902  BP: (!) 135/58 (!) 147/71  Pulse: 70   Resp: 18   Temp: 97.6 F (36.4 C)    11. Hyperlipidemia. Lipitor 12. Constipation--added senokot s   -avoid sorbitol  LOS (Days) 15 A FACE TO FACE EVALUATION WAS PERFORMED  Loisann Roach Lorie Phenix 02/01/2016 12:35 PM

## 2016-02-01 NOTE — Progress Notes (Addendum)
Occupational Therapy Session Note  Patient Details  Name: Christian [name] MRN: [id number] Date of Birth: 1940-09-06  Today's Date: 02/01/2016 OT Individual Time: 3545-6256 OT Individual Time Calculation (min): 59 min     Short Term Goals: Week 1:  OT Short Term Goal 1 (Week 1): Patient will complete upper body bathind and dressing at sink with min assist at w/c level. OT Short Term Goal 1 - Progress (Week 1): Not met OT Short Term Goal 2 (Week 1): Patient will complete SPT from w/c to toilet with mod assist OT Short Term Goal 2 - Progress (Week 1): Not met OT Short Term Goal 3 (Week 1): Pt will perform bed mobility using DME/AE, prn, with mod assist to manage BLE OT Short Term Goal 3 - Progress (Week 1): Not met OT Short Term Goal 4 (Week 1): Patient will don pants with max assist to stand supported to pull up pants. OT Short Term Goal 4 - Progress (Week 1): Not met OT Short Term Goal 5 (Week 1): Patient will complete lower body bathing at bed level with mod assist to wash buttocks and lower legs using AE, prn. OT Short Term Goal 5 - Progress (Week 1): Met Week 2:  OT Short Term Goal 1 (Week 2): Pt will tolerate 2 hours OOB in order to increase upright tolerance and functional positioning OT Short Term Goal 1 - Progress (Week 2): Not met OT Short Term Goal 2 (Week 2): Pt will compete UB dressing in seated position (w/c or EOB) with set-up/ supervision OT Short Term Goal 2 - Progress (Week 2): Progressing toward goal OT Short Term Goal 3 (Week 2): Pt will complete 3 grooming tasks seated in w/c with set-up OT Short Term Goal 3 - Progress (Week 2): Progressing toward goal OT Short Term Goal 4 (Week 2): Pt will transfer with assist of +1 in order to decrease caregiver burden OT Short Term Goal 4 - Progress (Week 2): Progressing toward goal  Skilled Therapeutic Interventions/Progress Updates:   Pt participated in skilled OT session focusing on sitting tolerance and LB exercise during self  care completion. Pt was lying in bed at time of arrival and agreeable to sit up for oral care/grooming/shaving tasks. Pt completed supine<sit with Min A and instruction on precautions. Bed positioned in slight reverse trendelenberg to take pressure off of right hip. Min A for all tasks completed due to pt requiring unilateral support for upright posture. Afterwards pt completed air bicycles in bed to increase strength and endurance. Pt was left with all needs within reach at end of session.   2nd Session 1:1 Tx (59 minutes) Pt participated in skilled OT session focusing on w/c tolerance, activity tolerance, and functional transfers. Pt was agreeable to attempt [facility] transfer at time of arrival. Pt reports now being incontinent, but verbalized understanding for education carryover in future when pt becomes continent at CIR. Transfer completed with Min A and RW with instruction for hand placement. Strong lean to left side while on BSC with pt tolerating sitting for 10 minutes. Towels placed on seat for comfort. Afterwards pt was agreeable to self propel in w/c in hallway. Transfer completed as written above. Inflatable donut cushion used for comfort. Pt self propelled 95 feet with supervision. Pt then reported feeling slightly nauseous and wanted to return to room. Pt transferred back to bed and was repositioned for comfort. No further c/o nausea. Pt was left with all needs within reach and reported feeling very proud of accomplishments  today!   Therapy Documentation Precautions:  Precautions Precautions: Fall Restrictions Weight Bearing Restrictions: Yes RLE Weight Bearing: Non weight bearing LLE Weight Bearing: Weight bearing as tolerated Other Position/Activity Restrictions: LLE WBAT for transfers only General:   Vital Signs: Therapy Vitals Temp: 98.5 F (36.9 C) Temp Source: Oral Pulse Rate: 86 Resp: 20 BP: 138/65 Patient Position (if appropriate): Lying Oxygen Therapy SpO2: 98 % O2  Device: Not Delivered Pain: Pt reported pain to be manageable with provided rest breaks    ADL: ADL ADL Comments: see Functional Assessment Exercises:   Other Treatments:    See Function Navigator for Current Functional Status.   Therapy/Group: Individual Therapy  Trampas Stettner A Asiyah Pineau 02/01/2016, 4:22 PM

## 2016-02-02 ENCOUNTER — Inpatient Hospital Stay (HOSPITAL_COMMUNITY): Payer: Medicare Other | Admitting: Physical Therapy

## 2016-02-02 LAB — GLUCOSE, CAPILLARY
GLUCOSE-CAPILLARY: 95 mg/dL (ref 65–99)
Glucose-Capillary: 112 mg/dL — ABNORMAL HIGH (ref 65–99)
Glucose-Capillary: 124 mg/dL — ABNORMAL HIGH (ref 65–99)
Glucose-Capillary: 144 mg/dL — ABNORMAL HIGH (ref 65–99)

## 2016-02-02 MED ORDER — AMLODIPINE BESYLATE 10 MG PO TABS
10.0000 mg | ORAL_TABLET | Freq: Every day | ORAL | Status: DC
  Administered 2016-02-03 – 2016-02-12 (×10): 10 mg via ORAL
  Filled 2016-02-02 (×9): qty 1

## 2016-02-02 MED ORDER — AMLODIPINE BESYLATE 2.5 MG PO TABS
2.5000 mg | ORAL_TABLET | Freq: Once | ORAL | Status: AC
  Administered 2016-02-02: 2.5 mg via ORAL
  Filled 2016-02-02: qty 1

## 2016-02-02 NOTE — Progress Notes (Signed)
Physical Therapy Session Note  Patient Details  Name: Christian [name] MRN: [id number] Date of Birth: 1940/05/02  Today's Date: 02/02/2016 PT Individual Time: 0902-0931 PT Individual Time Calculation (min): 29 min    Short Term Goals: Week 3:  PT Short Term Goal 1 (Week 3): Pt will perform bed mobility minA PT Short Term Goal 2 (Week 3): Pt will perform stand pivot transfer with minA PT Short Term Goal 3 (Week 3): Pt will propel w/c x50' with BUE and minA PT Short Term Goal 4 (Week 3): Pt will demonstrate performance of LE strengthening/ROM HEP with modI  Skilled Therapeutic Interventions/Progress Updates:    Pt received in bed with wife present & agreeable to tx; pt noted 4-5/10 pain & premedicated. Pt reported his back was itching. Pt transferred supine>sitting EOB with use of hospital bed features & supervision, with significantly extra time to complete task. Pt required encouragement to doff shirt without assistance; pt required 1UE & steady assist for sitting balance while doffing shirt. Pt tolerated sitting EOB x 5 minutes to allow therapist to wash back & apply lotion total assist. Pt donned clean shirt with min assist for sitting balance without UE support. Pt transferred supine>sitting EOB with mod assist with max encouragement to attempt as much of task on his own as possible. During mid transfer pt experienced posterior loss of balance back onto bed & required mod assist to transfer BLE onto bed. Pt required cuing for weight bearing throughout supine<>sitting EOB. At end of session pt left in bed with all needs within reach & wife present.   Pt's wife requested he go outside in w/c; educated pt & wife on precaution of pt not sitting in w/c >20 minutes 2/2 wound. Educated pt on potential of going outside in w/c in later therapy sessions & pt & wife both voiced understanding.   Therapy Documentation Precautions:  Precautions Precautions: Fall Restrictions Weight Bearing Restrictions:  Yes RLE Weight Bearing: Non weight bearing LLE Weight Bearing: Weight bearing as tolerated Other Position/Activity Restrictions: LLE WBAT for transfers only   See Function Navigator for Current Functional Status.   Therapy/Group: Individual Therapy  Waunita Schooner 02/02/2016, 12:47 PM

## 2016-02-02 NOTE — Progress Notes (Signed)
[place] PHYSICAL MEDICINE & REHABILITATION     PROGRESS NOTE    Subjective/Complaints: Pt laying in bed this AM.  He slept well and is looking forward to resting today and watching the [employer].    ROS: Denies CP, SOB, N/V/D.   Objective: Vital Signs: Blood pressure (!) 154/68, pulse 66, temperature 98 F (36.7 C), temperature source Oral, resp. rate 18, height 6\' 2"  (1.88 m), weight 102.5 kg (225 lb 15.5 oz), SpO2 95 %. No results found. No results for input(s): WBC, HGB, HCT, PLT in the last 72 hours. No results for input(s): NA, K, CL, GLUCOSE, BUN, CREATININE, CALCIUM in the last 72 hours.  Invalid input(s): CO CBG (last 3)   Recent Labs  02/01/16 2107 02/01/16 2129 02/02/16 0651  GLUCAP 66 76 112*    Wt Readings from Last 3 Encounters:  01/30/16 102.5 kg (225 lb 15.5 oz)  01/10/16 100.7 kg (222 lb)    Physical Exam:  Constitutional: He appears well-developed. NAD. HENT: Normocephalicand atraumatic.  Eyes: EOMare normal. No discharge.  Cardiovascular: Normal rate, regular rhythmand normal heart sounds.  Respiratory: Effort normaland breath sounds normal. No respiratory distress. He has no wheezes. He has no rales.  GI: Soft. Bowel sounds are normal. He exhibits no distension. There is no tenderness.  Genitourinary: +SPC in place Musculoskeletal: He exhibits edema. Right leg tender to palpation. Neurological: He is alertand oriented.  UE 5/5 prox to distal.  RLE: 1/5 HF, KE 3/5 ADF/PF.  LLE: 3/5 HF, 3/5 KE and 4/5 ADF/PF.   Skin: Skin is warm. Sacral wound covered with vac without leaks. Suprapubic catheter site clean   Psychiatric: He has a normal mood and affect. His behavior is normal. Judgmentand thought contentnormal  Assessment/Plan: 1. Functional and mobility deficits secondary to polytrauma which require 3+ hours per day of interdisciplinary therapy in a comprehensive inpatient rehab setting. Physiatrist is providing close team supervision and  24 hour management of active medical problems listed below. Physiatrist and rehab team continue to assess barriers to discharge/monitor patient progress toward functional and medical goals.  Function:  Bathing Bathing position   Position: Sitting EOB  Bathing parts Body parts bathed by patient: Right upper leg, Left upper leg, Abdomen, Chest, Left arm, Right lower leg, Left lower leg Body parts bathed by helper: Front perineal area, Buttocks, Back, Right arm  Bathing assist Assist Level: Touching or steadying assistance(Pt > 75%)      Upper Body Dressing/Undressing Upper body dressing   What is the patient wearing?: Pull over shirt/dress     Pull over shirt/dress - Perfomed by patient: Thread/unthread right sleeve, Thread/unthread left sleeve, Put head through opening, Pull shirt over trunk Pull over shirt/dress - Perfomed by helper: Pull shirt over trunk        Upper body assist Assist Level: Touching or steadying assistance(Pt > 75%)      Lower Body Dressing/Undressing Lower body dressing   What is the patient wearing?: Pants, Ted Hose, Non-skid slipper socks   Underwear - Performed by helper: Thread/unthread left underwear leg, Pull underwear up/down, Thread/unthread right underwear leg Pants- Performed by patient: Thread/unthread left pants leg Pants- Performed by helper: Thread/unthread right pants leg, Pull pants up/down, Thread/unthread left pants leg   Non-skid slipper socks- Performed by helper: Don/doff right sock, Don/doff left sock       Shoes - Performed by helper: Don/doff right shoe, Don/doff left shoe       TED Hose - Performed by helper: Don/doff right TED  hose, Don/doff left TED hose  Lower body assist Assist for lower body dressing: Touching or steadying assistance (Pt > 75%)      Toileting Toileting Toileting activity did not occur: No continent bowel/bladder event   Toileting steps completed by helper: Adjust clothing prior to toileting, Performs  perineal hygiene, Adjust clothing after toileting Toileting Assistive Devices: Toilet aid  Toileting assist Assist level: Two helpers   Transfers Chair/bed transfer Chair/bed transfer activity did not occur: Safety/medical concerns Chair/bed transfer method: Stand pivot Chair/bed transfer assist level: Touching or steadying assistance (Pt > 75%) Chair/bed transfer assistive device: Armrests, Medical sales representative Ambulation activity did not occur: Safety/medical Editor, commissioning activity did not occur: Safety/medical concerns     Assist Level:  (unable to tolerate sitting up or transfer to wc)  Cognition Comprehension Comprehension assist level: Follows complex conversation/direction with no assist  Expression Expression assist level: Expresses complex ideas: With no assist  Social Interaction Social Interaction assist level: Interacts appropriately with others - No medications needed.  Problem Solving Problem solving assist level: Solves complex problems: Recognizes & self-corrects  Memory Memory assist level: Recognizes or recalls 90% of the time/requires cueing < 10% of the time   Medical Problem List and Plan: 1.  Multi pelvic fractures status post sacroiliac screw fixation secondary to trauma.   -CIR PT/OT             -Touchdown weightbearing right leg to facilitate transfers.              -Weightbearing as tolerated left leg for transfers only 8 weeks,  2.  DVT Prophylaxis/Anticoagulation: SQ Lovenox.  -vascular study normal 3. Pain Management: Neurontin 300 mg TID  -increased fentanyl patch to 43mcg with positive results  -oxycodone prn/ with two scheduled doses to coincide with therapy 4. Acute blood loss anemia.  FeSo4 324 mg bid.   Follow-up hgb 9.2 5. Neuropsych: This patient is capable of making decisions on his own behalf. 6. Skin/Wound Care/Sacral wound: air mattress.  -s/p I&D, placement of Acell 10/19 by Dr. Migdalia Dk. Vac in  place   -pt to be off wound unless he's on air mattress 7. Fluids/Electrolytes/Nutrition: encourage PO 8. Urethral trauma. Status post suprapubic catheter. Follow-up with changes as directed. Urine remains clear/yellow   9. Diabetes mellitus with peripheral neuropathy. Hemoglobin A1c 7.9.  Levemir 15 units daily at bedtime  Glucophage 1000 mg twice a day  Glucotrol 10 mg before breakfast and 5 mg before supper, will consider decreasing tomorrow.  Labile, will cont to monitor  CBG (last 3)   Recent Labs  02/01/16 2107 02/01/16 2129 02/02/16 0651  GLUCAP 66 76 112*   -treat any spikes with SSI  10. Hypertension. Norvasc 7.5 mg daily, increased to 10mg  on 10/22.  Vitals:   02/02/16 0520 02/02/16 0844  BP: (!) 142/60 (!) 154/68  Pulse: 66   Resp: 18   Temp: 98 F (36.7 C)    11. Hyperlipidemia. Lipitor 12. Constipation--added senokot s   -avoid sorbitol  LOS (Days) 16 A FACE TO FACE EVALUATION WAS PERFORMED  Ankit Lorie Phenix 02/02/2016 10:49 AM

## 2016-02-03 ENCOUNTER — Inpatient Hospital Stay (HOSPITAL_COMMUNITY): Payer: Medicare Other | Admitting: Occupational Therapy

## 2016-02-03 ENCOUNTER — Inpatient Hospital Stay (HOSPITAL_COMMUNITY): Payer: Medicare Other | Admitting: Physical Therapy

## 2016-02-03 ENCOUNTER — Ambulatory Visit (HOSPITAL_COMMUNITY): Payer: Medicare Other

## 2016-02-03 LAB — GLUCOSE, CAPILLARY
GLUCOSE-CAPILLARY: 82 mg/dL (ref 65–99)
Glucose-Capillary: 121 mg/dL — ABNORMAL HIGH (ref 65–99)
Glucose-Capillary: 165 mg/dL — ABNORMAL HIGH (ref 65–99)
Glucose-Capillary: 113 mg/dL — ABNORMAL HIGH (ref 65–99)

## 2016-02-03 MED ORDER — SORBITOL 70 % SOLN
15.0000 mL | Freq: Once | Status: DC
  Filled 2016-02-03: qty 30

## 2016-02-03 MED ORDER — FLEET ENEMA 7-19 GM/118ML RE ENEM
1.0000 | ENEMA | Freq: Every day | RECTAL | Status: DC | PRN
  Administered 2016-02-03: 1 via RECTAL

## 2016-02-03 MED ORDER — SENNOSIDES-DOCUSATE SODIUM 8.6-50 MG PO TABS
2.0000 | ORAL_TABLET | Freq: Two times a day (BID) | ORAL | Status: DC
  Administered 2016-02-03 – 2016-02-05 (×4): 2 via ORAL
  Filled 2016-02-03 (×4): qty 2

## 2016-02-03 NOTE — Progress Notes (Signed)
Occupational Therapy Session Note  Patient Details  Name: Christian [name] MRN: [id number] Date of Birth: [birth year]  Today's Date: 02/03/2016 OT Individual Time: 5520-8022 OT Individual Time Calculation (min): 48 min     Short Term Goals: Week 1:  OT Short Term Goal 1 (Week 1): Patient will complete upper body bathind and dressing at sink with min assist at w/c level. OT Short Term Goal 1 - Progress (Week 1): Not met OT Short Term Goal 2 (Week 1): Patient will complete SPT from w/c to toilet with mod assist OT Short Term Goal 2 - Progress (Week 1): Not met OT Short Term Goal 3 (Week 1): Pt will perform bed mobility using DME/AE, prn, with mod assist to manage BLE OT Short Term Goal 3 - Progress (Week 1): Not met OT Short Term Goal 4 (Week 1): Patient will don pants with max assist to stand supported to pull up pants. OT Short Term Goal 4 - Progress (Week 1): Not met OT Short Term Goal 5 (Week 1): Patient will complete lower body bathing at bed level with mod assist to wash buttocks and lower legs using AE, prn. OT Short Term Goal 5 - Progress (Week 1): Met Week 2:  OT Short Term Goal 1 (Week 2): Pt will tolerate 2 hours OOB in order to increase upright tolerance and functional positioning OT Short Term Goal 1 - Progress (Week 2): Not met OT Short Term Goal 2 (Week 2): Pt will compete UB dressing in seated position (w/c or EOB) with set-up/ supervision OT Short Term Goal 2 - Progress (Week 2): Progressing toward goal OT Short Term Goal 3 (Week 2): Pt will complete 3 grooming tasks seated in w/c with set-up OT Short Term Goal 3 - Progress (Week 2): Progressing toward goal OT Short Term Goal 4 (Week 2): Pt will transfer with assist of +1 in order to decrease caregiver burden OT Short Term Goal 4 - Progress (Week 2): Progressing toward goal Week 3:  OT Short Term Goal 1 (Week 3): LTG=STG     Skilled Therapeutic Interventions/Progress Updates:   Pt was received sidelying in bed with c/o  abdominal discomfort. Per pt, he had not had a BM in 4-5 days. With nursing collaboration, pt was provided an enema to stimulate bowels. Rolling completed with Min A and pillow between legs. Toilet transfer completed with steady assist and RW with cues for NWB on R LE. With extra time, pt was able to void on [facility] Max A for toileting tasks. Pt was returned to bed and repositioned in sidelying position with nursing encouraged to positioning carryover with nursing staff. Pt was left in bed with all needs within reach.   Therapy Documentation Precautions:  Precautions Precautions: Fall Restrictions Weight Bearing Restrictions: Yes RLE Weight Bearing: Non weight bearing LLE Weight Bearing: Weight bearing as tolerated Other Position/Activity Restrictions: LLE WBAT for transfers only  Pain: C/o abdominal pain with reports of decrease after voiding  Pain Assessment Pain Score: 3  ADL: ADL ADL Comments: see Functional Assessment    See Function Navigator for Current Functional Status.   Therapy/Group: Individual Therapy  Christian [name] A Christian [name] 02/03/2016, 12:54 PM

## 2016-02-03 NOTE — Progress Notes (Signed)
4 Days Post-Op  Subjective: Patient seen for post operative follow up following placement of Acell and VAC dressing.  VAC has been maintaining a seal and he has been trying to stay off the area as much as possible.  Will plan for [facility] dressing changes to Wednesday. (We usually leave the [facility] in place for a week following placement of Acell) And he will need VAC changes every Mon-Wed-Friday following this first VAC dressing change.   Objective: Vital signs in last 24 hours: Temp:  [98 F (36.7 C)-98.1 F (36.7 C)] 98.1 F (36.7 C) (10/23 1400) Pulse Rate:  [77-88] 88 (10/23 1400) Resp:  [18] 18 (10/23 1400) BP: (134-144)/(56-68) 134/56 (10/23 1400) SpO2:  [96 %-97 %] 97 % (10/23 1400) Last BM Date: 01/30/16  Intake/Output from previous day: 10/22 0701 - 10/23 0700 In: 480 [P.O.:480] Out: 650 [Urine:650] Intake/Output this shift: Total I/O In: 360 [P.O.:360] Out: 450 [Urine:450]  General appearance: alert, cooperative, appears stated age and no distress VAC dressing in place and functioning well.   Lab Results:  No results for input(s): WBC, HGB, HCT, PLT in the last 72 hours. BMET No results for input(s): NA, K, CL, CO2, GLUCOSE, BUN, CREATININE, CALCIUM in the last 72 hours. PT/INR No results for input(s): LABPROT, INR in the last 72 hours. ABG No results for input(s): PHART, HCO3 in the last 72 hours.  Invalid input(s): PCO2, PO2  Studies/Results: No results found.  Anti-infectives: Anti-infectives    Start     Dose/Rate Route Frequency Ordered Stop   01/30/16 1438  polymyxin B 500,000 Units, bacitracin 50,000 Units in sodium chloride irrigation 0.9 % 500 mL irrigation  Status:  Discontinued       As needed 01/30/16 1439 01/30/16 1455   01/30/16 0600  ceFAZolin (ANCEF) IVPB 2g/100 mL premix     2 g 200 mL/hr over 30 Minutes Intravenous On call to O.R. 01/29/16 1934 01/30/16 1414      Assessment/Plan: s/p Procedure(s): IRRIGATION AND DEBRIDEMENT OF THE SACRUM  (N/A) Plan to start usual VAC dressing changes this Wednesday.   Will plan to follow up and see patient on next Monday, Oct. 30th for [facility] dressing change with Dr. Marla Roe.    LOS: 17 days    Tamy Accardo 02/03/2016

## 2016-02-03 NOTE — Progress Notes (Signed)
Occupational Therapy Session Note  Patient Details  Name: Christian [name] MRN: [id number] Date of Birth: 1940-09-23  Today's Date: 02/03/2016 OT Individual Time: [phone] and 1430-1500 OT Individual Time Calculation (min): 55 min and 30 min    Short Term Goals:Week 3:  OT Short Term Goal 1 (Week 3): LTG=STG  Skilled Therapeutic Interventions/Progress Updates:    Session One: Pt seen for OT session focusing on functional mobility and upright tolerance. Pt in supine upon arrival with wife present, agreeable to tx session. With increased time, pt transferred to EOB with increased time using hospital bed functions. He completed stand pivot transfer to w/c with min A using RW. Grooming completed seated in w/c at sink, pt able to lean forward to access sink. Heavy reliance on UE support in w/c for weight shifting  Due to hip discomfort.  Pt agreeable to attempt [facility] transfer. Min stand pivot to [facility], heavy use of UEs for controlled descent. Pt tolerated sitting on BSC for ~5 minutes with pillow only needed for back support, pt requesting return to bed. He ambulated ~20ft back to bed with min A, able to maintain WB pre-cautions. Pt positioned in L side lying, pillows for comfort and all needs in reach with wife present.   Session Two: Pt seen for OT session focusing on functional mobility and education. Pt in supine upon arrival with family members present. Pt agreeable to tx session. He transferred to EOB with min A using hospital bed functions. VCs provided for more efficient bed mobility techniques, however, pt wanting to cont with "doing it the way I know works for me", despite it taking increased time and effort. He completed min A stand pivot with RW to w/c. Pt sat quickly when getting into w/c, landing on edge of chair, able to reposition for comfort/ safety.  He completed sit <> stand with min A from w/c. VCs, education and demonstration provided varrying hand placement on RW/ armrests during  transfer, however, pt cont to want to cont to do things "his way" which he has been successful with.  Pt returned to bed at end of session, mod A for LE positioning. Pt positioned with pillows for comfort, all needs in reach.   Therapy Documentation Precautions:  Precautions Precautions: Fall Restrictions Weight Bearing Restrictions: Yes RLE Weight Bearing: Non weight bearing LLE Weight Bearing: Weight bearing as tolerated Other Position/Activity Restrictions: LLE WBAT for transfers only Pain:   5/10 in hip. Repositioned.  ADL: ADL ADL Comments: see Functional Assessment  See Function Navigator for Current Functional Status.   Therapy/Group: Individual Therapy  [name], Jeffrie Lofstrom C 02/03/2016, 7:05 AM

## 2016-02-03 NOTE — Progress Notes (Signed)
Physical Therapy Session Note  Patient Details  Name: Christian [name] MRN: [id number] Date of Birth: Aug 30, 1940  Today's Date: 02/03/2016 PT Individual Time: 1100-1200 PT Individual Time Calculation (min): 60 min    Short Term Goals: Week 3:  PT Short Term Goal 1 (Week 3): Pt will perform bed mobility minA PT Short Term Goal 2 (Week 3): Pt will perform stand pivot transfer with minA PT Short Term Goal 3 (Week 3): Pt will propel w/c x50' with BUE and minA PT Short Term Goal 4 (Week 3): Pt will demonstrate performance of LE strengthening/ROM HEP with modI  Skilled Therapeutic Interventions/Progress Updates:   Pt received supine in bed, c/o pain as below and agreeable to treatment. Pt performed supine>sit with bedrails and HOB elevated, S however does require increased time due to pain. Sit >stand from edge of bed with min guard using RW. Stand pivot transfer to w/c with min guard, poor eccentric control for stand >sit and significant increase in pain once sitting. W/c propulsion with BUE x100' with occasional short rest breaks due to discomfort with sitting. Pt transported remaining distance to gym dependently for energy conservation. Showed pt simulated car and discussed transfer training to prepare for d/c, and discussed performing in simulated car vs family car, and need to do at least the family car 2-3 times before d/c. Also showed pt ADL apartment and flat bed, and discussed practicing transfers and bed mobility to regular bed. Pt declines to attempt either bed or car transfer at this time due to fatigue from a busy morning. Returned to bed with min guard stand pivot. Gail sit >supine for BLE management. Pt repositioned in bed without assist. BLE heel slides 3x10 with AAROM initially on RLE, progressed to AROM. Remained propped in L sidelying at end of session, all needs in reach.   Therapy Documentation Precautions:  Precautions Precautions: Fall Restrictions Weight Bearing Restrictions:  Yes RLE Weight Bearing: Non weight bearing LLE Weight Bearing: Weight bearing as tolerated Other Position/Activity Restrictions: LLE WBAT for transfers only Pain: Pain Assessment Pain Assessment: 0-10 Pain Score: 3  Pain Type: Acute pain Pain Location: Hip Pain Orientation: Right Pain Descriptors / Indicators: Aching;Burning Pain Onset: On-going Patients Stated Pain Goal: 3 Pain Intervention(s): Medication (See eMAR)   See Function Navigator for Current Functional Status.   Therapy/Group: Individual Therapy  [name] 02/03/2016, 12:02 PM

## 2016-02-03 NOTE — Plan of Care (Signed)
Problem: RH BOWEL ELIMINATION Goal: RH STG MANAGE BOWEL WITH ASSISTANCE STG Manage Bowel with  Mod Assistance.   Outcome: Not Progressing No BM for 4 days

## 2016-02-03 NOTE — Plan of Care (Signed)
Problem: RH BOWEL ELIMINATION Goal: RH STG MANAGE BOWEL W/MEDICATION W/ASSISTANCE STG Manage Bowel with Medication with min Assistance.   Outcome: Not Progressing No BM with use of scheduled meds.

## 2016-02-03 NOTE — Progress Notes (Signed)
[place] PHYSICAL MEDICINE & REHABILITATION     PROGRESS NOTE    Subjective/Complaints: Wife is asking about pastic surgery f/u for sacral wound   Constipated no vomiting but decreased appetite ROS: Denies CP, SOB, N/V/D.   Objective: Vital Signs: Blood pressure (!) 144/68, pulse 77, temperature 98 F (36.7 C), temperature source Oral, resp. rate 18, height 6\' 2"  (1.88 m), weight 102.5 kg (225 lb 15.5 oz), SpO2 96 %. No results found. No results for input(s): WBC, HGB, HCT, PLT in the last 72 hours. No results for input(s): NA, K, CL, GLUCOSE, BUN, CREATININE, CALCIUM in the last 72 hours.  Invalid input(s): CO CBG (last 3)   Recent Labs  02/02/16 1644 02/02/16 2114 02/03/16 0642  GLUCAP 95 144* 121*    Wt Readings from Last 3 Encounters:  01/30/16 102.5 kg (225 lb 15.5 oz)  01/10/16 100.7 kg (222 lb)    Physical Exam:  Constitutional: He appears well-developed. NAD. HENT: Normocephalicand atraumatic.  Eyes: EOMare normal. No discharge.  Cardiovascular: Normal rate, regular rhythmand normal heart sounds.  Respiratory: Effort normaland breath sounds normal. No respiratory distress. He has no wheezes. He has no rales.  GI: Soft. Bowel sounds are normal. He exhibits no distension. There is no tenderness.  Genitourinary: +SPC in place Musculoskeletal: He exhibits edema. Right leg tender to palpation. Neurological: He is alertand oriented.  UE 5/5 prox to distal.  RLE: 1/5 HF, KE 3/5 ADF/PF.  LLE: 3/5 HF, 3/5 KE and 4/5 ADF/PF.   Skin: Skin is warm. Sacral wound covered with vac without leaks. Suprapubic catheter site clean   Psychiatric: He has a normal mood and affect. His behavior is normal. Judgmentand thought contentnormal  Assessment/Plan: 1. Functional and mobility deficits secondary to polytrauma which require 3+ hours per day of interdisciplinary therapy in a comprehensive inpatient rehab setting. Physiatrist is providing close team supervision and 24  hour management of active medical problems listed below. Physiatrist and rehab team continue to assess barriers to discharge/monitor patient progress toward functional and medical goals.  Function:  Bathing Bathing position   Position: Sitting EOB  Bathing parts Body parts bathed by patient: Right upper leg, Left upper leg, Abdomen, Chest, Left arm, Right lower leg, Left lower leg Body parts bathed by helper: Front perineal area, Buttocks, Back, Right arm  Bathing assist Assist Level: Touching or steadying assistance(Pt > 75%)      Upper Body Dressing/Undressing Upper body dressing   What is the patient wearing?: Pull over shirt/dress     Pull over shirt/dress - Perfomed by patient: Thread/unthread right sleeve, Thread/unthread left sleeve, Put head through opening, Pull shirt over trunk Pull over shirt/dress - Perfomed by helper: Pull shirt over trunk        Upper body assist Assist Level: Touching or steadying assistance(Pt > 75%)      Lower Body Dressing/Undressing Lower body dressing   What is the patient wearing?: Pants, Ted Hose, Non-skid slipper socks   Underwear - Performed by helper: Thread/unthread left underwear leg, Pull underwear up/down, Thread/unthread right underwear leg Pants- Performed by patient: Thread/unthread left pants leg Pants- Performed by helper: Thread/unthread right pants leg, Pull pants up/down, Thread/unthread left pants leg   Non-skid slipper socks- Performed by helper: Don/doff right sock, Don/doff left sock       Shoes - Performed by helper: Don/doff right shoe, Don/doff left shoe       TED Hose - Performed by helper: Don/doff right TED hose, Don/doff left TED hose  Lower body assist Assist for lower body dressing: Touching or steadying assistance (Pt > 75%)      Toileting Toileting Toileting activity did not occur: No continent bowel/bladder event   Toileting steps completed by helper: Adjust clothing prior to toileting, Performs  perineal hygiene, Adjust clothing after toileting Toileting Assistive Devices: Toilet aid  Toileting assist Assist level: Two helpers   Transfers Chair/bed transfer Chair/bed transfer activity did not occur: Safety/medical concerns Chair/bed transfer method: Stand pivot Chair/bed transfer assist level: Touching or steadying assistance (Pt > 75%) Chair/bed transfer assistive device: Armrests, Medical sales representative Ambulation activity did not occur: Safety/medical Editor, commissioning activity did not occur: Safety/medical concerns     Assist Level:  (unable to tolerate sitting up or transfer to wc)  Cognition Comprehension Comprehension assist level: Follows complex conversation/direction with no assist  Expression Expression assist level: Expresses complex ideas: With no assist  Social Interaction Social Interaction assist level: Interacts appropriately with others - No medications needed.  Problem Solving Problem solving assist level: Solves complex problems: Recognizes & self-corrects  Memory Memory assist level: Recognizes or recalls 90% of the time/requires cueing < 10% of the time   Medical Problem List and Plan: 1.  Multi pelvic fractures status post sacroiliac screw fixation secondary to trauma.   -CIR PT/OT             -Touchdown weightbearing right leg to facilitate transfers.              -Weightbearing as tolerated left leg for transfers only 8 weeks,  2.  DVT Prophylaxis/Anticoagulation: SQ Lovenox.  -vascular study normal 3. Pain Management: Neurontin 300 mg TID  -increased fentanyl patch to 27mcg with positive results  -oxycodone prn/ with two scheduled doses to coincide with therapy 4. Acute blood loss anemia.  FeSo4 324 mg bid.   Follow-up hgb 9.2 5. Neuropsych: This patient is capable of making decisions on his own behalf. 6. Skin/Wound Care/Sacral wound: air mattress.  -s/p I&D, placement of Acell 10/19 by Dr. Marla Roe. Vac  in place, change in 2 days, pastics is managing not WOC   -pt to be off wound unless he's on air mattress 7. Fluids/Electrolytes/Nutrition: encourage PO 8. Urethral trauma. Status post suprapubic catheter. Follow-up with changes as directed. Urine remains clear/yellow   9. Diabetes mellitus with peripheral neuropathy. Hemoglobin A1c 7.9.  Levemir 15 units daily at bedtime  Glucophage 1000 mg twice a day  Glucotrol 10 mg before breakfast and 5 mg before supper, will consider decreasing tomorrow.  Labile, will cont to monitor   CBG (last 3)   Recent Labs  02/02/16 1644 02/02/16 2114 02/03/16 0642  GLUCAP 95 144* 121*   -treat any spikes with SSI  10. Hypertension. Norvasc 7.5 mg daily, increased to 10mg  on 10/22.  Vitals:   02/02/16 1500 02/03/16 0433  BP: 130/60 (!) 144/68  Pulse: 77 77  Resp: 18 18  Temp: 98.4 F (36.9 C) 98 F (36.7 C)   11. Hyperlipidemia. Lipitor 12. Constipation--increase senokot s   -repeat low dose sorbitol  LOS (Days) 17 A FACE TO FACE EVALUATION WAS PERFORMED  KIRSTEINS,ANDREW E 02/03/2016 8:40 AM

## 2016-02-03 NOTE — Plan of Care (Signed)
Problem: RH SKIN INTEGRITY Goal: RH STG SKIN FREE OF INFECTION/BREAKDOWN Free of further skin breakdown mod assist  Outcome: Not Progressing Unable to turn completely off buttocks. Goal: RH STG MAINTAIN SKIN INTEGRITY WITH ASSISTANCE STG Maintain Skin Integrity With mod Assistance.   Outcome: Not Progressing Unable to turn completely off buttocks.

## 2016-02-03 NOTE — Plan of Care (Signed)
Problem: RH BOWEL ELIMINATION Goal: RH STG MANAGE BOWEL WITH ASSISTANCE STG Manage Bowel with  Mod Assistance.   Outcome: Not Progressing Required enema for BM Goal: RH STG MANAGE BOWEL W/MEDICATION W/ASSISTANCE STG Manage Bowel with Medication with min Assistance.   Outcome: Not Progressing Enema given today  Problem: RH SKIN INTEGRITY Goal: RH STG SKIN FREE OF INFECTION/BREAKDOWN Free of further skin breakdown mod assist  Outcome: Not Progressing New stage II to right ankle

## 2016-02-04 ENCOUNTER — Ambulatory Visit (HOSPITAL_COMMUNITY): Payer: Medicare Other

## 2016-02-04 ENCOUNTER — Inpatient Hospital Stay (HOSPITAL_COMMUNITY): Payer: Medicare Other | Admitting: Occupational Therapy

## 2016-02-04 ENCOUNTER — Inpatient Hospital Stay (HOSPITAL_COMMUNITY): Payer: Medicare Other

## 2016-02-04 ENCOUNTER — Inpatient Hospital Stay (HOSPITAL_COMMUNITY): Payer: Medicare Other | Admitting: Physical Therapy

## 2016-02-04 LAB — GLUCOSE, CAPILLARY
GLUCOSE-CAPILLARY: 168 mg/dL — AB (ref 65–99)
GLUCOSE-CAPILLARY: 141 mg/dL — AB (ref 65–99)
GLUCOSE-CAPILLARY: 88 mg/dL (ref 65–99)
Glucose-Capillary: 106 mg/dL — ABNORMAL HIGH (ref 65–99)

## 2016-02-04 MED ORDER — POLYETHYLENE GLYCOL 3350 17 G PO PACK: 17.0000 g | PACK | Freq: Every day | ORAL | Status: DC

## 2016-02-04 NOTE — Consult Note (Signed)
WOC spoke with PA Neta Mends to clarify orders for [facility] dressing change, to begin Wednesday and then M/W/F thereafter.  The dressing change this week on Wednesday will be the first postoperative dressing and therefore will require the surgeon or the surgeon's midlevel provider to be at the bedside. Raquel Sarna will remove dressing today instead and I have ordered supplies to the bedside for the bedside nurse should they need to replace it.  Raquel Sarna is aware a bedside nurse can assist if needed.  [place] team will keep patient on our list for follow up should assistance be needed with any dressings after this first post dressing change.  I have gone by the unit and discussed this with the primary RN today as well.  1 VAC Medium sponge, oil emulsion dressing (Adaptic), and VAC canister ordered for plastic surgeon's PA.  [place], [place]

## 2016-02-04 NOTE — Progress Notes (Signed)
Physical Therapy Session Note  Patient Details  Name: Christian [name] MRN: [id number] Date of Birth: [birth year]  Today's Date: 02/04/2016 PT Individual Time: 1105-1200 PT Individual Time Calculation (min): 55 min    Short Term Goals: Week 3:  PT Short Term Goal 1 (Week 3): Pt will perform bed mobility minA PT Short Term Goal 2 (Week 3): Pt will perform stand pivot transfer with minA PT Short Term Goal 3 (Week 3): Pt will propel w/c x50' with BUE and minA PT Short Term Goal 4 (Week 3): Pt will demonstrate performance of LE strengthening/ROM HEP with modI  Skilled Therapeutic Interventions/Progress Updates:  Pt agreeable to practice car transfer.  Pt performed supine > sit with bed rail and supervision with extra time and performed stand pivot bed > w/c with min A with pt sitting too quickly in w/c-reminded pt to slow descent with UE.  Performed w/c mobility x 50' with UE and min A due to veering to R.  Verbalized sequence for car transfer and pt return demonstrated stand pivot w/c <> elevated car with RW and min A with verbal cues to maintain NWB RLE and mod A to place each LE into car but min A to remove.  Returned to w/c and transferred back to bed stand pivot min A with improved control during stand > sit but required mod A for sit > supine.  Pt left with LE propped on pillows and prevalon boots donned.   Therapy Documentation Precautions:  Precautions Precautions: Fall Restrictions Weight Bearing Restrictions: Yes RLE Weight Bearing: Non weight bearing LLE Weight Bearing: Weight bearing as tolerated Other Position/Activity Restrictions: LLE WBAT for transfers only Pain: Pain Assessment Pain Score: 0-No pain   See Function Navigator for Current Functional Status.   Therapy/Group: Individual Therapy  Raylene Everts [place] 02/04/2016, 12:30 PM

## 2016-02-04 NOTE — Op Note (Signed)
Please see op note

## 2016-02-04 NOTE — Progress Notes (Signed)
Physical Therapy Note  Patient Details  Name: Christian [name] MRN: [id number] Date of Birth: June 21, 1940 Today's Date: 02/04/2016    Pt's plan of care adjusted to 15/7 after speaking with care team and discussed with MD in team conference as pt currently unable to tolerate current therapy schedule with OT, PT, and SLP.     [name] 02/04/2016, 12:48 PM

## 2016-02-04 NOTE — Progress Notes (Signed)
5 Days Post-Op  Subjective: Patient seen for initial VAC dressing change after debridement of sacral wound and placement of Acell.   The VAC dressing was removed. The majority of the [place] has incorporated, but new necrotic tissue in 75% of wound bed. Some gentle debridement done at bedside, but necrotic tissue was very adherent. No aggressive debridement with patient on Lovenox. Will not reapply VAC at this point.   Objective: Vital signs in last 24 hours: Temp:  [98.1 F (36.7 C)] 98.1 F (36.7 C) (10/24 1445) Pulse Rate:  [105] 105 (10/24 1445) Resp:  [18] 18 (10/24 1445) BP: (136)/(70) 136/70 (10/24 1445) SpO2:  [96 %] 96 % (10/24 1445) Last BM Date: 02/03/16  Intake/Output from previous day: 10/23 0701 - 10/24 0700 In: 600 [P.O.:600] Out: 1100 [Urine:1100] Intake/Output this shift: No intake/output data recorded.  General appearance: alert, cooperative and no distress Sacral wound bed with 75% [place] necrotic tissue. 25% pink granulation.   Lab Results:  No results for input(s): WBC, HGB, HCT, PLT in the last 72 hours. BMET No results for input(s): NA, K, CL, CO2, GLUCOSE, BUN, CREATININE, CALCIUM in the last 72 hours. PT/INR No results for input(s): LABPROT, INR in the last 72 hours. ABG No results for input(s): PHART, HCO3 in the last 72 hours.  Invalid input(s): PCO2, PO2  Studies/Results: No results found.  Anti-infectives: Anti-infectives    Start     Dose/Rate Route Frequency Ordered Stop   01/30/16 1438  polymyxin B 500,000 Units, bacitracin 50,000 Units in sodium chloride irrigation 0.9 % 500 mL irrigation  Status:  Discontinued       As needed 01/30/16 1439 01/30/16 1455   01/30/16 0600  ceFAZolin (ANCEF) IVPB 2g/100 mL premix     2 g 200 mL/hr over 30 Minutes Intravenous On call to O.R. 01/29/16 1934 01/30/16 1414      Assessment/Plan: s/p Procedure(s): IRRIGATION AND DEBRIDEMENT OF THE SACRUM (N/A) VAC discontinued due to necrotic tissue.   Some  Acell incorporated.  Would keep wound bed moist with surgical lubricant/gauze packing.  Will check back on Thursday. May need further debridement early next week, possibly Monday.  Continue strict pressure relief measures Could benefit from nutrition consult and protein supplement.  May want to check pre-albumin to help determine protein needs.   LOS: 18 days    Sipriano Fendley,PA-C Plastic Surgery [phone]

## 2016-02-04 NOTE — Progress Notes (Signed)
PA with Dr. Marla Roe present to perform first VAC change postoperatively.  Current dressing removed.  Wound with what appears to be necrotic tissue.  PA recommended discontinuing VAC for now and placing 4x4 gauze with moderate amount of surgical lubricant to keep Acel moist, securing it with an abdominal pad and tape at least daily and PRN soiling.  Patients wife at bedside, concerned for infection.  PA to reassess patients wound Friday and will recommend further care at that time.  Patient tolerated procedure well, denies pain. Brita Romp, RN

## 2016-02-04 NOTE — Progress Notes (Signed)
Occupational Therapy Session Note  Patient Details  Name: Christian [name] MRN: [id number] Date of Birth: 20-Dec-1940  Today's Date: 02/04/2016 OT Individual Time: 0845-1000 and 1430-1500 OT Individual Time Calculation (min): 75 min and 30 min    Short Term Goals:Week 3:  OT Short Term Goal 1 (Week 3): LTG=STG  Skilled Therapeutic Interventions/Progress Updates:    Session One: Pt seen for OT ADL bathing/dressing session. Pt in supine upon arrival with wife present, agreeable to tx session. He transferred to EOB with increased time using hospital bed functions with supervision. Sitting EOB, pt completed UB bathing/dressing, occasional min A required for dynamic sitting balance when pt utilizing B UEs. Relied heavily on UE for balance/ pressure relief when able. Mod A to stand from EOB and min A stand pivot transfer completed to w/c. Grooming completed from w/c level at sink supervision- mod I. Pt able to lean forward to access items and control water faucet. Pt required return to supine due to increasing pain in R hip before LB bathing/dressing to be completed. Close supervision stand pivot to EOB and mod A to return to supine. Pt with complaints of stomach cramps upon return to supine. Small smear of stool on brief, rolled with min A to change brief and hygiene completed. Pt left in supine at end of session, pt's wife willing to assist with LB bathing/dressing from bed level.  VCs and demonstration provided throughout session for alternative bed mobility and transfer techniques. Education/ discussion regarding d/c planning and home set-up took place with pt and pt's wife in prep for upcoming d/c.   Session Two: Pt seen for OT session focusing on upright w/c tolerance and functional transfers. Pt received with hand off from other OT at fall festival, pt up in w/c and agreeable to tx session.  Pt tolerated total time ~1 hour of combined therapies up in chair, weightshifting as needed with UE for  comfort. LEs supported on elevating leg rests. Pt completed fall craft activities at table top level while seated in chair. Upon return to room, pt completed min A stand pivot transfer to EOB with increased time, mod A to return to supine, pt left in supine with prevlon boots donned and wife present, all needs in reach.  Pt and wife educated regarding donning/doffing and wear schedule of prevlon boots, discussed d/c planning with pt, pt feeling comfortable and confident in wife's ability to provide all needed physical assist at d/c.   Therapy Documentation Precautions:  Precautions Precautions: Fall Restrictions Weight Bearing Restrictions: Yes RLE Weight Bearing: Non weight bearing LLE Weight Bearing: Weight bearing as tolerated Other Position/Activity Restrictions: LLE WBAT for transfers only ADL: ADL ADL Comments: see Functional Assessment  See Function Navigator for Current Functional Status.   Therapy/Group: Individual Therapy  [name], Dalphine Cowie C 02/04/2016, 7:12 AM

## 2016-02-04 NOTE — Progress Notes (Signed)
Occupational Therapy Note  Patient Details  Name: Christian [name] MRN: [id number] Date of Birth: May 29, 1940  Today's Date: 02/04/2016 OT Individual Time: 1400-1430 OT Individual Time Calculation (min): 30 min    Pt c/o 5/10 pain in R hip; repositioned Individual Therapy  Pt resting in bed upon arrival with wife present.  Focus on bed mobility with bed flat at supervision level and SPT with RW bed>w/c with steady A.  Pt requires more than a reasonable amount of time to complete tasks.  Pt continues to c/o pain in R hip which precludes extended periods sitting in w/c.  Pt transitioned to Day Room to participate in [place] remained in w/c and handed off to [place] for continued therapy.   Leotis Shames [facility] 02/04/2016, 3:02 PM

## 2016-02-05 ENCOUNTER — Ambulatory Visit (HOSPITAL_COMMUNITY): Payer: Medicare Other

## 2016-02-05 ENCOUNTER — Inpatient Hospital Stay (HOSPITAL_COMMUNITY): Payer: Medicare Other

## 2016-02-05 ENCOUNTER — Inpatient Hospital Stay (HOSPITAL_COMMUNITY): Payer: Medicare Other | Admitting: Occupational Therapy

## 2016-02-05 ENCOUNTER — Inpatient Hospital Stay (HOSPITAL_COMMUNITY): Payer: Medicare Other | Admitting: Physical Therapy

## 2016-02-05 LAB — GLUCOSE, CAPILLARY
GLUCOSE-CAPILLARY: 117 mg/dL — AB (ref 65–99)
GLUCOSE-CAPILLARY: 75 mg/dL (ref 65–99)
GLUCOSE-CAPILLARY: 80 mg/dL (ref 65–99)
Glucose-Capillary: 165 mg/dL — ABNORMAL HIGH (ref 65–99)
Glucose-Capillary: 138 mg/dL — ABNORMAL HIGH (ref 65–99)

## 2016-02-05 MED ORDER — SENNOSIDES-DOCUSATE SODIUM 8.6-50 MG PO TABS
3.0000 | ORAL_TABLET | Freq: Two times a day (BID) | ORAL | Status: DC
  Administered 2016-02-06 – 2016-02-08 (×4): 3 via ORAL
  Filled 2016-02-05 (×6): qty 3

## 2016-02-05 MED ORDER — PRO-STAT SUGAR FREE PO LIQD
30.0000 mL | Freq: Two times a day (BID) | ORAL | Status: DC
  Administered 2016-02-05 – 2016-02-12 (×12): 30 mL via ORAL
  Filled 2016-02-05 (×13): qty 30

## 2016-02-05 MED ORDER — GLUCERNA SHAKE PO LIQD
237.0000 mL | Freq: Two times a day (BID) | ORAL | Status: DC
Start: 2016-02-05 — End: 2016-02-12
  Administered 2016-02-05 – 2016-02-12 (×11): 237 mL via ORAL

## 2016-02-05 MED ORDER — SENNOSIDES-DOCUSATE SODIUM 8.6-50 MG PO TABS
1.0000 | ORAL_TABLET | Freq: Once | ORAL | Status: DC
  Filled 2016-02-05: qty 1

## 2016-02-05 NOTE — Progress Notes (Signed)
Initial Nutrition Assessment  DOCUMENTATION CODES:   Not applicable  INTERVENTION:  Provide Glucerna Shake po BID, each supplement provides 220 kcal and 10 grams of protein.  Provide 30 ml Prostat po BID, each supplement provides 100 kcal and 15 grams of protein.   Encourage adequate PO intake.   NUTRITION DIAGNOSIS:   Increased nutrient needs related to wound healing as evidenced by estimated needs.  GOAL:   Patient will meet greater than or equal to 90% of their needs  MONITOR:   PO intake, Supplement acceptance, Labs, Weight trends, Skin, I & O's  REASON FOR ASSESSMENT:   Consult Wound healing  ASSESSMENT:   75 y.o. right handed male with history of hypertension, diabetes mellitus. CT of chest abdomen and pelvis showed sagittal right sacral alar fracture extending across the right sacroiliac joint and involving the right iliac bone. Bilateral parasymphyseal fractures extending into the pubic symphysis posteriorly. Right inferior pubic ramus fracture with minimal displacement. CT retrograde urethrogram due to some hematuria completed follow-up for urology services showed a large extraperitoneal clot anterior to the bladder.  Underwent transsacral screw 2 for pelvic ring fracture. Also noted sacral ulcer 6 x 6 cm in size with Dr. Marla Roe  consulted10/08/2015 and advised air mattress as well as vitamin C and zinc.  Pt reports decreased appetite due to constipation and feelings of fullness. Pt reports plans for enema today. Meal completion has been 25-50%. Pt reports eating well PTA with no other difficulties. Weight has been stable. RD to order nutritional supplements to aid in caloric and protein needs as well as in wound healing. Pt knowledgeable on adequate protein intake for wound healing.   Nutrition-Focused physical exam completed. Findings are no fat depletion, mild to moderate muscle depletion, and no edema.   Labs and medications reviewed.   Diet Order:  Diet  Heart Room service appropriate? Yes; Fluid consistency: Thin  Skin:  Wound (see comment) (Stg 2 on R ankle, incision on coccyx)  Last BM:  10/24  Height:   Ht Readings from Last 1 Encounters:  01/17/16 6\' 2"  (1.88 m)    Weight:   Wt Readings from Last 1 Encounters:  01/30/16 225 lb 15.5 oz (102.5 kg)    Ideal Body Weight:  86.36 kg  BMI:  Body mass index is 29.01 kg/m.  Estimated Nutritional Needs:   Kcal:  2200-2500  Protein:  120-135 grams  Fluid:  2.2 - 2.5 L/day  EDUCATION NEEDS:   Education needs addressed  Corrin Parker, MS, RD, LDN Pager # [phone] After hours/ weekend pager # [phone]
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
## 2016-02-05 NOTE — Progress Notes (Signed)
Physical Therapy Session Note  Patient Details  Name: Christian [name] MRN: [id number] Date of Birth: [birth year]  Today's Date: 02/05/2016 PT Individual Time: 0905-1000 PT Individual Time Calculation (min): 55 min    Short Term Goals: Week 3:  PT Short Term Goal 1 (Week 3): Pt will perform bed mobility minA PT Short Term Goal 2 (Week 3): Pt will perform stand pivot transfer with minA PT Short Term Goal 3 (Week 3): Pt will propel w/c x50' with BUE and minA PT Short Term Goal 4 (Week 3): Pt will demonstrate performance of LE strengthening/ROM HEP with modI  Skilled Therapeutic Interventions/Progress Updates:    Pt received in bed noting his stomach is "churning" and he "almost heaved earlier". Pt declined OOB activity but agreeable to bed level exercises. Pt performed the following BLE exercises: ankle pumps, short arc quads, heel slides (AAROM RLE), hip abduction slides (AAROM RLE) and hip adduction squeezes with pillow all 2 sets of 10-20 reps. Pt required increased time to complete exercises with occasional rest breaks 2/2 fatigue & stomach hurting. Pt with difficulty maintaining neutral alignment of RLE throughout exercises. At end of session pt left in bed with all needs within reach.   Therapy Documentation Precautions:  Precautions Precautions: Fall Restrictions Weight Bearing Restrictions: Yes RLE Weight Bearing: Non weight bearing LLE Weight Bearing: Weight bearing as tolerated Other Position/Activity Restrictions: LLE WBAT for transfers only   Pain: Pain Assessment Pain Assessment: 0-10 Pain Score: 5  Pain Type: Acute pain Pain Location:  (stomach) Pain Descriptors / Indicators: Burning Pain Intervention(s):  (Pt reports RN aware)   See Function Navigator for Current Functional Status.   Therapy/Group: Individual Therapy  Waunita Schooner 02/05/2016, 10:39 AM
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
## 2016-02-05 NOTE — Progress Notes (Signed)
Social Work Patient ID: Christian [name], male   DOB: Aug 17, 1940, 75 y.o.   MRN: [id number]   Have reviewed team conference with pt and wife.  Both aware that we are continuing to plan toward 11/1 d/c date.  Discussed possibility of home with wound VAC and wife concerned about managing this.  Per MD from yesterday that wound care plan may change.  Wife feels that she can manage his assist needs given his progress.  We discussed DME needs and follow up. Will continue to follow.  Kanoa Phillippi

## 2016-02-05 NOTE — Progress Notes (Signed)
Occupational Therapy Session Note  Patient Details  Name: Christian [name] MRN: [id number] Date of Birth: [birth year]  Today's Date: 02/05/2016 OT Individual Time: 1100-1200 OT Individual Time Calculation (min): 60 min     Short Term Goals:Week 3:  OT Short Term Goal 1 (Week 3): LTG=STG  Skilled Therapeutic Interventions/Progress Updates:    Pt seen for OT session focusing on functional transfers with toileting task. Pt in supine upon arrival with RN present about to administer enema. Enema delivered with pt in sidelying and then he completed bed mobility to EOB with supervision and increased time. He completed stand pivot with min A and VCs for technique for transfer to [facility]. Pt completed push-up on BSC for hygiene to be completed total A. Required 3-4 trials of pushing up as pt would fatigue before total hygiene could be completed. He returned to supine for hygiene to be completely finished and new brief and pants to be donned. Pt rolled with supervision, able to assist with pulling pants up. Pt left in supine at end of session, all needs in reach.  Discussed throughout session importance of participation with ADLs, decreasing caregiver burden, and d/[name] planning.   Therapy Documentation Precautions:  Precautions Precautions: Fall Restrictions Weight Bearing Restrictions: Yes RLE Weight Bearing: Non weight bearing LLE Weight Bearing: Weight bearing as tolerated Other Position/Activity Restrictions: LLE WBAT for transfers only ADL: ADL ADL Comments: see Functional Assessment  See Function Navigator for Current Functional Status.   Therapy/Group: Individual Therapy  Christian [name] 02/05/2016, 7:11 AM

## 2016-02-05 NOTE — Progress Notes (Signed)
Physical Therapy Session Note  Patient Details  Name: NDREW PIKE MRN: [id number] Date of Birth: 1941-02-17  Today's Date: 02/05/2016 PT Individual Time: 1300-1330 PT Individual Time Calculation (min): 30 min    Short Term Goals: Week 3:  PT Short Term Goal 1 (Week 3): Pt will perform bed mobility minA PT Short Term Goal 2 (Week 3): Pt will perform stand pivot transfer with minA PT Short Term Goal 3 (Week 3): Pt will propel w/c x50' with BUE and minA PT Short Term Goal 4 (Week 3): Pt will demonstrate performance of LE strengthening/ROM HEP with modI  Skilled Therapeutic Interventions/Progress Updates:   Pt received supine in bed, c/o pain 4/10 in R hip and agreeable to treatment. Supine>sit with S and bedrails, increased time d/t pain. Sit <>stand with close S and RW for balance, min cues to maintain RLE NWB. Standing tolerance x2 min before pt reports having incontinent bowel movement. Sit >supine modA for BLE management. Rolling R/L with bedrails and S while therapist performed hygiene and clothing management maxA, with pt assisting with pulling pants up/down, however unable to complete without assist. RN alerted to soiled dressing on sacrum, and RN present to correct. Remained supine in bed at end of session, all needs in reach.   Therapy Documentation Precautions:  Precautions Precautions: Fall Restrictions Weight Bearing Restrictions: Yes RLE Weight Bearing: Non weight bearing LLE Weight Bearing: Weight bearing as tolerated Other Position/Activity Restrictions: LLE WBAT for transfers only   See Function Navigator for Current Functional Status.   Therapy/Group: Individual Therapy  [name] 02/05/2016, 1:36 PM
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
## 2016-02-05 NOTE — Progress Notes (Signed)
Social Work  Patient ID: [name], male   DOB: Jan 06, 1941, 75 y.o.   MRN: [id number]  Lowella Curb, LCSW Social Worker Signed   Patient Care Conference Date of Service: 02/05/2016  1:38 PM      Hide copied text Hover for attribution information Inpatient RehabilitationTeam Conference and Plan of Care Update Date: 02/04/2016   Time: 10:00 AM      Patient Name: EBON STABLES      Medical Record Number: [id number]  Date of Birth: [birth year] Sex: Male         Room/Bed: 4W21C/4W21C-01 Payor Info: Payor: MEDICARE / Plan: MEDICARE PART A AND B / Product Type: *No Product type* /     Admitting Diagnosis: Pelvis FX SACRAL ULCER  Admit Date/Time:  01/17/2016  4:59 PM Admission Comments: No comment available    Primary Diagnosis:  Multiple pelvic fractures (HCC) Principal Problem: Multiple pelvic fractures [facility])       Patient Active Problem List    Diagnosis Date Noted  . DM type 2 with diabetic peripheral neuropathy ([place])    . Constipation due to pain medication    . Benign essential HTN    . Post-operative pain    . Sacral decubitus ulcer 01/17/2016  . Accident caused by farm tractor 01/15/2016  . Urethral injury 01/15/2016  . Acute blood loss anemia 01/15/2016  . Acute kidney injury ([place]) 01/15/2016  . Multiple pelvic fractures ([place]) 01/10/2016      Expected Discharge Date: Expected Discharge Date: 02/12/16   Team Members Present: Physician leading conference: Dr. Delice Lesch Social Worker Present: Lennart Pall, LCSW Nurse Present: Dorien Chihuahua, RN PT Present: Kem Parkinson, PT OT Present: Napoleon Form, OT SLP Present: Weston Anna, SLP PPS Coordinator present : Daiva Nakayama, RN, CRRN       Current Status/Progress Goal Weekly Team Focus  Medical   Multi pelvic fractures status post sacroiliac screw fixation secondary to trauma,  Improve transfers, mobility, wound  See above   Bowel/Bladder   Suprapubic cath in place; LBM 10/25 after SSE; bowel meds increased  Min  assist  Assess and treat for constipation as needed; provide suprapubic cath care   Swallow/Nutrition/ Hydration             ADL's   Mod A LB bathing; Set-up/ supervision UB bathing/dressing; min A stand pivot functional transfers with RW  min A transfers, LB self care, bathing; downgrading toileting task due to decreased standing balance/ tolerance  Upright tolerance, functional transfers, toileting, LB dressing, family ed, d/c planning.    Mobility   min/modA bed mobility, minA sit <>stand, min guard stand pivot transfers  supervision w/c level transfers; mod I w/c mobility  Bed mobility, stand pivot transfers, w/c propulsion, d/c planning   Communication             Safety/Cognition/ Behavioral Observations           Pain   scheduled oxy IR effective in controlling pain  < 3  Assess and treat for pain q shift and prn   Skin   Sacral wound with new necrotic tissue which may need surgical debridement; new stage II to right lateral ankle; prevalon boots in place, airmattress  Max assist  Assess skin q shift and prn; perform dressing changes per MD order.     Rehab Goals Patient on target to meet rehab goals: Yes *See Care Plan and progress notes for long and short-term goals.   Barriers to Discharge: HTN, DM,  ABLA, Wound VAC in place   Possible Resolutions to Barriers:  Adjust chronic medical conditional, VAC to be changed tomorrow   Discharge Planning/Teaching Needs:  planning home with wife who can provide 24/7 assistance.  Teaching is ongoing with wife.   Team Discussion:  VAc to be changed tomorrow and Monday.  Reaching supervision to min/guard transfers.  Limited on how long he can tolerate up in chair.  Recommend decrease in tx schedule to 15/7  Revisions to Treatment Plan:  Change in tx schedule    Continued Need for Acute Rehabilitation Level of Care: The patient requires daily medical management by a physician with specialized training in physical medicine and rehabilitation  for the following conditions: Daily direction of a multidisciplinary physical rehabilitation program to ensure safe treatment while eliciting the highest outcome that is of practical value to the patient.: Yes Daily medical management of patient stability for increased activity during participation in an intensive rehabilitation regime.: Yes Daily analysis of laboratory values and/or radiology reports with any subsequent need for medication adjustment of medical intervention for : Post surgical problems;Diabetes problems;Wound care problems;Blood pressure problems   Triton Heidrich 02/05/2016, 3:37 PM      Lowella Curb, LCSW Social Worker Signed   Patient Care Conference Date of Service: 01/28/2016  4:04 PM      Hide copied text Hover for attribution information Inpatient RehabilitationTeam Conference and Plan of Care Update Date: 01/28/2016   Time: 2:10 PM      Patient Name: NAHUM BIAGINI      Medical Record Number: [id number]  Date of Birth: 1940/06/18 Sex: Male         Room/Bed: 4W21C/4W21C-01 Payor Info: Payor: MEDICARE / Plan: MEDICARE PART A AND B / Product Type: *No Product type* /     Admitting Diagnosis: Pelvis FX  Admit Date/Time:  01/17/2016  4:59 PM Admission Comments: No comment available    Primary Diagnosis:  Multiple pelvic fractures (HCC) Principal Problem: Multiple pelvic fractures [facility])       Patient Active Problem List    Diagnosis Date Noted  . Sacral decubitus ulcer 01/17/2016  . Accident caused by farm tractor 01/15/2016  . Urethral injury 01/15/2016  . Acute blood loss anemia 01/15/2016  . Acute kidney injury ([place]) 01/15/2016  . Multiple pelvic fractures ([place]) 01/10/2016      Expected Discharge Date: Expected Discharge Date: 02/12/16   Team Members Present: Physician leading conference: Dr. Alger Simons Social Worker Present: Lennart Pall, LCSW Nurse Present: Heather Roberts, RN PT Present: Kem Parkinson, PT OT Present: [name], OT SLP  Present: Weston Anna, SLP PPS Coordinator present : Daiva Nakayama, RN, CRRN       Current Status/Progress Goal Weekly Team Focus  Medical   pain improving but still a factor. hydro therapy for wound showin progress.  pain control  pain mgt, wound care, regulation of bowels   Bowel/Bladder   SP cath, continent bowel.  SP managed, no s/s infection. BM QD, QOD with PRNs as needed.  Monitor.   Swallow/Nutrition/ Hydration             ADL's   min A - mod A for LB self care, setup to min A for UB, max A +2 for A/P transfers; tolerate sitting in recliner today  min A transfers, LB self care, bathing; min A for toileting on BSC  sitting tolerance in upright position, transfers in and out of bed, use of AE for LB dressing,    Mobility  maxA bed mobility and +2 A/P transfers, able to tolerate OOB <1 hour in w/c  supervision w/c level transfers; mod I w/c mobility  pain management, OOB tolerance, bed mobility, transfers   Communication             Safety/Cognition/ Behavioral Observations           Pain   Scheduled Oxy IR, occasional PRN dose at HS.  Managed at goal 3/10  Monitor, encourage alternative methods of pain control; repositioning.   Skin   Sacral wound with hydrotherapy, santyl, air mattress.  Would in healing process at DC, no new breakdown, no injuries this admission.  Monitor.     Rehab Goals Patient on target to meet rehab goals: Yes *See Care Plan and progress notes for long and short-term goals.   Barriers to Discharge: pain mgt, ortho precautions   Possible Resolutions to Barriers:  continued adjustment of analgesic regimen   Discharge Planning/Teaching Needs:  planning home with wife who can provide 24/7 assistance.  Teaching closer to d/c   Team Discussion:  Up in recliner today with w/c cushion and reclined is all he could really tolerate.  Poor tolerance for sitting in w/c with max x 1 hr.  Pt avoids any position that causes any amount of pain.  Currently heavy  assistance +2 for transfers.  Is dressing at bed level with min assist.  May need to be more aggressive with pain management.  Team feels need to extend LOS x week  Revisions to Treatment Plan:  Change in d/c date to 11/1    Continued Need for Acute Rehabilitation Level of Care: The patient requires daily medical management by a physician with specialized training in physical medicine and rehabilitation for the following conditions: Daily direction of a multidisciplinary physical rehabilitation program to ensure safe treatment while eliciting the highest outcome that is of practical value to the patient.: Yes Daily medical management of patient stability for increased activity during participation in an intensive rehabilitation regime.: Yes Daily analysis of laboratory values and/or radiology reports with any subsequent need for medication adjustment of medical intervention for : Post surgical problems;Other   Treniyah Lynn 01/29/2016, 10:14 AM      Lowella Curb, LCSW Social Worker Signed   Patient Care Conference Date of Service: 01/28/2016  4:04 PM      Hide copied text Hover for attribution information Inpatient RehabilitationTeam Conference and Plan of Care Update Date: 01/28/2016   Time: 2:10 PM      Patient Name: KAMARON BRYMER      Medical Record Number: [id number]  Date of Birth: 03/16/1941 Sex: Male         Room/Bed: 4W21C/4W21C-01 Payor Info: Payor: MEDICARE / Plan: MEDICARE PART A AND B / Product Type: *No Product type* /     Admitting Diagnosis: Pelvis FX  Admit Date/Time:  01/17/2016  4:59 PM Admission Comments: No comment available    Primary Diagnosis:  Multiple pelvic fractures (HCC) Principal Problem: Multiple pelvic fractures [facility])       Patient Active Problem List    Diagnosis Date Noted  . Sacral decubitus ulcer 01/17/2016  . Accident caused by farm tractor 01/15/2016  . Urethral injury 01/15/2016  . Acute blood loss anemia 01/15/2016  . Acute kidney injury  ([place]) 01/15/2016  . Multiple pelvic fractures ([place]) 01/10/2016      Expected Discharge Date: Expected Discharge Date: 02/12/16   Team Members Present: Physician leading conference: Dr. Alger Simons Social Worker Present:  Lennart Pall, LCSW Nurse Present: Heather Roberts, RN PT Present: Kem Parkinson, PT OT Present: [name], OT SLP Present: Weston Anna, SLP PPS Coordinator present : Daiva Nakayama, RN, CRRN       Current Status/Progress Goal Weekly Team Focus  Medical   pain improving but still a factor. hydro therapy for wound showin progress.  pain control  pain mgt, wound care, regulation of bowels   Bowel/Bladder   SP cath, continent bowel.  SP managed, no s/s infection. BM QD, QOD with PRNs as needed.  Monitor.   Swallow/Nutrition/ Hydration             ADL's   min A - mod A for LB self care, setup to min A for UB, max A +2 for A/P transfers; tolerate sitting in recliner today  min A transfers, LB self care, bathing; min A for toileting on BSC  sitting tolerance in upright position, transfers in and out of bed, use of AE for LB dressing,    Mobility   maxA bed mobility and +2 A/P transfers, able to tolerate OOB <1 hour in w/c  supervision w/c level transfers; mod I w/c mobility  pain management, OOB tolerance, bed mobility, transfers   Communication             Safety/Cognition/ Behavioral Observations           Pain   Scheduled Oxy IR, occasional PRN dose at HS.  Managed at goal 3/10  Monitor, encourage alternative methods of pain control; repositioning.   Skin   Sacral wound with hydrotherapy, santyl, air mattress.  Would in healing process at DC, no new breakdown, no injuries this admission.  Monitor.     Rehab Goals Patient on target to meet rehab goals: Yes *See Care Plan and progress notes for long and short-term goals.   Barriers to Discharge: pain mgt, ortho precautions   Possible Resolutions to Barriers:  continued adjustment of analgesic regimen     Discharge Planning/Teaching Needs:  planning home with wife who can provide 24/7 assistance.  Teaching closer to d/c   Team Discussion:  Up in recliner today with w/c cushion and reclined is all he could really tolerate.  Poor tolerance for sitting in w/c with max x 1 hr.  Pt avoids any position that causes any amount of pain.  Currently heavy assistance +2 for transfers.  Is dressing at bed level with min assist.  May need to be more aggressive with pain management.  Team feels need to extend LOS x week  Revisions to Treatment Plan:  Change in d/c date to 11/1    Continued Need for Acute Rehabilitation Level of Care: The patient requires daily medical management by a physician with specialized training in physical medicine and rehabilitation for the following conditions: Daily direction of a multidisciplinary physical rehabilitation program to ensure safe treatment while eliciting the highest outcome that is of practical value to the patient.: Yes Daily medical management of patient stability for increased activity during participation in an intensive rehabilitation regime.: Yes Daily analysis of laboratory values and/or radiology reports with any subsequent need for medication adjustment of medical intervention for : Post surgical problems;Other   Shanena Pellegrino, [place] 01/29/2016, 10:14 AM

## 2016-02-06 ENCOUNTER — Inpatient Hospital Stay (HOSPITAL_COMMUNITY): Payer: Medicare Other | Admitting: Physical Therapy

## 2016-02-06 ENCOUNTER — Inpatient Hospital Stay (HOSPITAL_COMMUNITY): Payer: Medicare Other | Admitting: Occupational Therapy

## 2016-02-06 ENCOUNTER — Ambulatory Visit (HOSPITAL_COMMUNITY): Payer: Medicare Other

## 2016-02-06 ENCOUNTER — Ambulatory Visit: Payer: Self-pay | Admitting: Physician Assistant

## 2016-02-06 LAB — GLUCOSE, CAPILLARY
GLUCOSE-CAPILLARY: 113 mg/dL — AB (ref 65–99)
GLUCOSE-CAPILLARY: 146 mg/dL — AB (ref 65–99)
GLUCOSE-CAPILLARY: 120 mg/dL — AB (ref 65–99)
GLUCOSE-CAPILLARY: 95 mg/dL (ref 65–99)

## 2016-02-06 NOTE — Progress Notes (Signed)
Occupational Therapy Weekly Progress Note  Patient Details  Name: Christian [name] MRN: [id number] Date of Birth: [birth year]  Beginning of progress report period: January 31, 2016 End of progress report period: February 06, 2016  Today's Date: 02/06/2016 OT Individual Time: 1000-1100 OT Individual Time Calculation (min): 60 min    Short terms goals were not set as focus on LTG in prep for upcoming d/c. Pt cont to make steady progress towards OT goals. He cont to be limited by pain (though much improved) and decreased functional activity tolerance. Pt's wife has been present for majority of tx session with caregiver training on-going and have begun some hands on training.   Patient continues to demonstrate the following deficits: acute pain and muscle weakness (generalized) and therefore will continue to benefit from skilled OT intervention to enhance overall performance with BADL and Reduce care partner burden.  Patient progressing toward long term goals..  Plan of care revisions: Toileting and LB goals have been downgraded. Pt has demonstrated ability to complete aspects of LB dressing and toileting tasks, however, depending on fatigue and pain level pt unable to consistently perform tasks at min A level. Pt's wife willing and able to perform needed assist. . Pt's toilet transfer goal has been upgraded to supervision via stand pivot to [facility]. See POC for goal details.   OT Short Term Goals Week 3:  OT Short Term Goal 1 (Week 3): LTG=STG OT Short Term Goal 1 - Progress (Week 3): Progressing toward goal Week 4:  OT Short Term Goal 1 (Week 4): LTG=STG due to upcoming d/c   Skilled Therapeutic Interventions/Progress Updates:    Pt seen for OT session focusing on LB bathing/dressing and establishing home routine. Pt in supine upon arrival, agreeable to tx session. Long discussion with pt and his wife regarding plans for bathing/dressing and touleting tourine at home. Pt appears to have decreased  awareness of deficits and hopes for home as pt states he plans to use regular toilet at home. Currently, pt only able to tolerate 20 minute in w/c per order, he has urgent need for BMs and therefore would likely have to transfer bed>w/c> toilet in order to go to actual bathroom/ toilet for toileting needs. Attempted to make pt aware of realities at this current time and current functional level as pt able to complete all transfers supervision- min A, however, requires significantly increased time due to pain. He plans to complete LB dressing/ bathing from toilet/ BSC. Pt  Transferred to EOB with supervision and completed stand pivot transfer with RW to [facility]. Pt completed  LB dressing task from [facility] using reacher to assist and VCs fo ruse of hemi dressing technique. With increased time, pt able to thread B LEs into pants and stood with guarding assist to pull pants up. He returned to w/c and completed grooming tasks from w/c level at sink  Pt returned modified side-lying position at end of session, left with all needs in reach and wife present. Pt and wife educated throughout session regarding DME, use of 3-1 BSC, OT goals, and d/c planning    Therapy Documentation Precautions:  Precautions Precautions: Fall Restrictions Weight Bearing Restrictions: Yes RLE Weight Bearing: Non weight bearing LLE Weight Bearing: Weight bearing as tolerated Other Position/Activity Restrictions: LLE WBAT for transfers only ADL: ADL ADL Comments: see Functional Assessment  See Function Navigator for Current Functional Status.   Therapy/Group: Individual Therapy  [name], Darwin Guastella C 02/06/2016, 7:12 AM

## 2016-02-06 NOTE — Progress Notes (Signed)
Physical Therapy Session Note  Patient Details  Name: Christian [name] MRN: [id number] Date of Birth: April 20, 1940  Today's Date: 02/06/2016 PT Individual Time: 1515-1557 PT Individual Time Calculation (min): 42 min    Short Term Goals: Week 3:  PT Short Term Goal 1 (Week 3): Pt will perform bed mobility minA PT Short Term Goal 1 - Progress (Week 3): Partly met PT Short Term Goal 2 (Week 3): Pt will perform stand pivot transfer with minA PT Short Term Goal 2 - Progress (Week 3): Met PT Short Term Goal 3 (Week 3): Pt will propel w/c x50' with BUE and minA PT Short Term Goal 3 - Progress (Week 3): Met PT Short Term Goal 4 (Week 3): Pt will demonstrate performance of LE strengthening/ROM HEP with modI PT Short Term Goal 4 - Progress (Week 3): Met Week 4:  PT Short Term Goal 1 (Week 4): =LTG due to estimated LOS  Skilled Therapeutic Interventions/Progress Updates:     Patient received supine in bed and agreeable to PT.   Supine>sit with supervision Assist for safety and heavy use of bed rails.   PT instructed patient in Stand pivot transfer to [facility]. With RW with mod cues to prevent WB through LLE.    WC mobility with Supervision Assist through controlled environment x 122f and 936fand through simulated community environment of gift shop x [id number] provided min cues for awareness of obstacles near drive wheels and potential need for decreased elevation in leg rests.    Return to bed with stand pivot tranfer and close super vision Assist with min cues for proper UE placement and to prevent WB through LLE. Sit>supine to bed with min Assist to control the LLE.   Patient left supine in bed with call bell in reach and all bed rails up per patient request.    Therapy Documentation Precautions:  Precautions Precautions: Fall Restrictions Weight Bearing Restrictions: Yes RLE Weight Bearing: Non weight bearing LLE Weight Bearing: Weight bearing as tolerated Other Position/Activity  Restrictions: LLE WBAT for transfers only General:   Vital Signs: Therapy Vitals Temp: 97.6 F (36.4 C) Temp Source: Oral Pulse Rate: 92 Resp: 18 BP: 122/69 Patient Position (if appropriate): Lying Oxygen Therapy SpO2: 94 % O2 Device: Not Delivered   See Function Navigator for Current Functional Status.   Therapy/Group: Individual Therapy  AusLorie Phenix/26/2017, 4:00 PM

## 2016-02-06 NOTE — Progress Notes (Signed)
[facility] PHYSICAL MEDICINE & REHABILITATION     PROGRESS NOTE    Subjective/Complaints: Reasonable night. Had questions about wound plan and potential surgery today Pain levels are stable. Had bm last night  ROS: Pt denies fever, rash/itching, headache, blurred or double vision, nausea, vomiting, abdominal pain, diarrhea, chest pain, shortness of breath, palpitations, dysuria, dizziness,   anxiety, or depression  Objective: Vital Signs: Blood pressure (!) 150/72, pulse 74, temperature 97.5 F (36.4 C), temperature source Oral, resp. rate 18, height 6\' 2"  (1.88 m), weight 93.9 kg (207 lb), SpO2 93 %. Dg Abd Portable 1v  Result Date: 02/05/2016 CLINICAL DATA:  Nausea and vomiting. EXAM: PORTABLE ABDOMEN - 1 VIEW COMPARISON:  01/16/2016. FINDINGS: Surgical screws are noted traversing the pelvis in stable position. Drainage catheter noted over the lower pelvis in stable position. Several air-filled loops of small bowel noted. Colonic gas pattern is normal. Very mild adynamic ileus cannot be entirely excluded. Prominent amount of stool noted throughout the colon rectum suggesting constipation . IMPRESSION: 1. Prominent amount of stool noted throughout the colon rectum suggesting constipation. 2. Very mild prominence of several small bowel loops noted. Mild adynamic ileus cannot be excluded. Follow-up exam can be obtained to demonstrate resolution. Electronically Signed   By: Marcello Moores  Register   On: 02/05/2016 10:20   No results for input(s): WBC, HGB, HCT, PLT in the last 72 hours. No results for input(s): NA, K, CL, GLUCOSE, BUN, CREATININE, CALCIUM in the last 72 hours.  Invalid input(s): CO CBG (last 3)   Recent Labs  02/05/16 1634 02/05/16 2045 02/06/16 0613  GLUCAP 75 138* 113*    Wt Readings from Last 3 Encounters:  02/06/16 93.9 kg (207 lb)  01/10/16 100.7 kg (222 lb)    Physical Exam:  Constitutional: He appears well-developed. NAD. HENT: Normocephalicand atraumatic.   Eyes: EOMare normal. No discharge.  Cardiovascular: Normal rate, regular rhythmand normal heart sounds.  Respiratory: Effort normaland breath sounds normal. No respiratory distress. He has no wheezes. He has no rales.  GI: Soft. Bowel sounds are normal. He exhibits no distension. There is no tenderness.  Genitourinary: +SPC in place Musculoskeletal: He exhibits edema. Right leg tender to palpation. Neurological: He is alertand oriented.  UE 5/5 prox to distal.  RLE: 1/5 HF, KE 3/5 ADF/PF.  LLE: 3/5 HF, 3/5 KE and 4/5 ADF/PF.   Skin: Skin is warm. Sacral wound covered with vac without leaks. Suprapubic catheter site clean   Psychiatric: He has a normal mood and affect. His behavior is normal. Judgmentand thought contentnormal  Assessment/Plan: 1. Functional and mobility deficits secondary to polytrauma which require 3+ hours per day of interdisciplinary therapy in a comprehensive inpatient rehab setting. Physiatrist is providing close team supervision and 24 hour management of active medical problems listed below. Physiatrist and rehab team continue to assess barriers to discharge/monitor patient progress toward functional and medical goals.  Function:  Bathing Bathing position   Position: Sitting EOB  Bathing parts Body parts bathed by patient: Right arm, Left arm, Chest, Abdomen (UB sitting EOB, LB in w/c) Body parts bathed by helper: Front perineal area, Buttocks, Back, Right arm  Bathing assist Assist Level: Touching or steadying assistance(Pt > 75%)      Upper Body Dressing/Undressing Upper body dressing   What is the patient wearing?: Pull over shirt/dress     Pull over shirt/dress - Perfomed by patient: Thread/unthread right sleeve, Thread/unthread left sleeve, Put head through opening, Pull shirt over trunk Pull over shirt/dress - Perfomed  by helper: Pull shirt over trunk        Upper body assist Assist Level: Set up      Lower Body Dressing/Undressing Lower  body dressing   What is the patient wearing?: Pants, Non-skid slipper socks   Underwear - Performed by helper: Thread/unthread left underwear leg, Pull underwear up/down, Thread/unthread right underwear leg Pants- Performed by patient: Pull pants up/down Pants- Performed by helper: Thread/unthread right pants leg, Thread/unthread left pants leg   Non-skid slipper socks- Performed by helper: Don/doff right sock, Don/doff left sock       Shoes - Performed by helper: Don/doff right shoe, Don/doff left shoe       TED Hose - Performed by helper: Don/doff right TED hose, Don/doff left TED hose  Lower body assist Assist for lower body dressing: Touching or steadying assistance (Pt > 75%)      Toileting Toileting Toileting activity did not occur: No continent bowel/bladder event   Toileting steps completed by helper: Adjust clothing prior to toileting, Performs perineal hygiene, Adjust clothing after toileting Toileting Assistive Devices: Toilet aid  Toileting assist Assist level: Touching or steadying assistance (Pt.75%)   Transfers Chair/bed transfer Chair/bed transfer activity did not occur: Safety/medical concerns Chair/bed transfer method: Stand pivot Chair/bed transfer assist level: Touching or steadying assistance (Pt > 75%) Chair/bed transfer assistive device: Armrests, Medical sales representative Ambulation activity did not occur: Safety/medical Editor, commissioning activity did not occur: Safety/medical concerns   Max wheelchair distance: 100 Assist Level: Supervision or verbal cues  Cognition Comprehension Comprehension assist level: Follows complex conversation/direction with no assist  Expression Expression assist level: Expresses complex ideas: With no assist  Social Interaction Social Interaction assist level: Interacts appropriately with others - No medications needed.  Problem Solving Problem solving assist level: Solves complex problems:  Recognizes & self-corrects  Memory Memory assist level: Recognizes or recalls 90% of the time/requires cueing < 10% of the time   Medical Problem List and Plan: 1.  Multi pelvic fractures status post sacroiliac screw fixation secondary to trauma.   -CIR PT/OT             -Touchdown weightbearing right leg to facilitate transfers.              -Weightbearing as tolerated left leg for transfers only 8 weeks,  2.  DVT Prophylaxis/Anticoagulation: SQ Lovenox.  -vascular study normal 3. Pain Management: Neurontin 300 mg TID  -continue fentanyl patch at 70mcg   -oxycodone prn/ with two scheduled doses to coincide with therapy 4. Acute blood loss anemia.  FeSo4 324 mg bid.    CBC Latest Ref Rng & Units 01/30/2016 01/20/2016 01/18/2016  WBC 4.0 - 10.5 K/uL 7.8 10.1 10.9(H)  Hemoglobin 13.0 - 17.0 g/dL 9.2(L) 7.7(L) 7.5(L)  Hematocrit 39.0 - 52.0 % 30.5(L) 24.8(L) 24.0(L)  Platelets 150 - 400 K/uL 400 256 246   5. Neuropsych: This patient is capable of making decisions on his own behalf. 6. Skin/Wound Care/Sacral wound: air mattress.  -s/p I&D, placement of Acell 10/19 by Dr. Marla Roe.  -for further I &D today given residual fibronecrotic tissue   -pt to be off wound unless he's on air mattress 7. Fluids/Electrolytes/Nutrition: encourage PO 8. Urethral trauma. Status post suprapubic catheter. Follow-up with changes as directed. Urine remains clear/yellow   9. Diabetes mellitus with peripheral neuropathy. Hemoglobin A1c 7.9.  Levemir 15 units daily at bedtime  Glucophage 1000 mg twice a day  Glucotrol 10 mg before breakfast and 5 mg before supper  -continue current medication schedule for now   CBG (last 3)   Recent Labs  02/05/16 1634 02/05/16 2045 02/06/16 0613  GLUCAP 75 138* 113*   -treat any spikes with SSI  10. Hypertension. Norvasc increased to 10mg  on 10/22.  Vitals:   02/06/16 0418 02/06/16 0831  BP: (!) 140/59 (!) 150/72  Pulse: 74   Resp: 18   Temp: 97.5 F (36.4  C)    11. Hyperlipidemia. Lipitor 12. Constipation vs mild ileus   -results with enema. Will need to more aggressive with maintenance program (senna -s just increased yesterday)  LOS (Days) 20 A FACE TO FACE EVALUATION WAS PERFORMED  SWARTZ,ZACHARY T 02/06/2016 9:54 AM

## 2016-02-06 NOTE — Progress Notes (Signed)
Occupational Therapy Session Note  Patient Details  Name: Christian [name] MRN: [id number] Date of Birth: [birth year]  Today's Date: 02/06/2016 OT Individual Time: 900-945 OT Individual Time Calculation (min): 45 min     Short Term Goals:Week 3:  OT Short Term Goal 1 (Week 3): LTG=STG OT Short Term Goal 1 - Progress (Week 3): Progressing toward goal Week 4:  OT Short Term Goal 1 (Week 4): LTG=STG due to upcoming d/c  Skilled Therapeutic Interventions/Progress Updates:    Pt's wife present for therapy and discussed some strategies to use at home with self care, such as bathing on BSC to prevent getting bed wet. Pt agreeable to trying this. Completed RW transfer to [facility] and then was able to continue have BM on commode.  He had some BM that was on his brief already that he was not aware of.  Pt bathed self except for feet (did not want ot use long sponge) and perineal area/buttocks (not willing to let go with one hand on RW). "not today, lets not push it today" Donned shirt, transferred back to bed for pants but needed full A with pants as he did not want to try the reacher. Needed A to lift B legs back into bed as he did not want to use leg lifter today.  Pt in bed with all needs met.  Therapy Documentation Precautions:  Precautions Precautions: Fall Restrictions Weight Bearing Restrictions: Yes RLE Weight Bearing: Non weight bearing LLE Weight Bearing: Weight bearing as tolerated Other Position/Activity Restrictions: LLE WBAT for transfers only Therapy Vitals BP: (!) 150/72   Pain: 8/10 in R foot   ADL: ADL ADL Comments: see Functional Assessment  See Function Navigator for Current Functional Status.   Therapy/Group: Individual Therapy  [place] 02/06/2016, 12:21 PM

## 2016-02-06 NOTE — Progress Notes (Addendum)
7 Days Post-Op  Subjective: Seen for follow up of sacral wound. It has not changed much.  We discussed going back to the OR next Monday 02/10/16 and patient and wife are in agreement.   Objective: Vital signs in last 24 hours: Temp:  [97.5 F (36.4 C)-98.1 F (36.7 C)] 97.5 F (36.4 C) (10/26 0418) Pulse Rate:  [74-77] 74 (10/26 0418) Resp:  [18] 18 (10/26 0418) BP: (130-150)/(59-72) 150/72 (10/26 0831) SpO2:  [93 %-100 %] 93 % (10/26 0418) Weight:  [93.9 kg (207 lb)-103.4 kg (228 lb)] 93.9 kg (207 lb) (10/26 0418) Last BM Date: 02/06/16  Intake/Output from previous day: 10/25 0701 - 10/26 0700 In: 480 [P.O.:480] Out: 575 [Urine:575] Intake/Output this shift: Total I/O In: 120 [P.O.:120] Out: -   General appearance: alert, cooperative and no distress Sacral wound with Acell incorporated inferiorly, but necrotic tissue superiorly.   Lab Results:  No results for input(s): WBC, HGB, HCT, PLT in the last 72 hours. BMET No results for input(s): NA, K, CL, CO2, GLUCOSE, BUN, CREATININE, CALCIUM in the last 72 hours. PT/INR No results for input(s): LABPROT, INR in the last 72 hours. ABG No results for input(s): PHART, HCO3 in the last 72 hours.  Invalid input(s): PCO2, PO2  Studies/Results: Dg Abd Portable 1v  Result Date: 02/05/2016 CLINICAL DATA:  Nausea and vomiting. EXAM: PORTABLE ABDOMEN - 1 VIEW COMPARISON:  01/16/2016. FINDINGS: Surgical screws are noted traversing the pelvis in stable position. Drainage catheter noted over the lower pelvis in stable position. Several air-filled loops of small bowel noted. Colonic gas pattern is normal. Very mild adynamic ileus cannot be entirely excluded. Prominent amount of stool noted throughout the colon rectum suggesting constipation . IMPRESSION: 1. Prominent amount of stool noted throughout the colon rectum suggesting constipation. 2. Very mild prominence of several small bowel loops noted. Mild adynamic ileus cannot be excluded.  Follow-up exam can be obtained to demonstrate resolution. Electronically Signed   By: Marcello Moores  Register   On: 02/05/2016 10:20    Anti-infectives: Anti-infectives    Start     Dose/Rate Route Frequency Ordered Stop   01/30/16 1438  polymyxin B 500,000 Units, bacitracin 50,000 Units in sodium chloride irrigation 0.9 % 500 mL irrigation  Status:  Discontinued       As needed 01/30/16 1439 01/30/16 1455   01/30/16 0600  ceFAZolin (ANCEF) IVPB 2g/100 mL premix     2 g 200 mL/hr over 30 Minutes Intravenous On call to O.R. 01/29/16 1934 01/30/16 1414      Assessment/Plan: s/p Procedure(s): IRRIGATION AND DEBRIDEMENT OF THE SACRUM (N/A) Discussed plan to go back to the OR with Silvestre Mesi, PA-C  Will plan for OR on Monday  Wrote pre op orders and orders to hold Lovenox after 8:00 pm on Sunday evening.    LOS: 20 days    Lyall Faciane,PA-C Plastic Surgery [phone]

## 2016-02-06 NOTE — Progress Notes (Signed)
Physical Therapy Weekly Progress Note  Patient Details  Name: Christian [name] MRN: [id number] Date of Birth: October 07, 1940  Beginning of progress report period: January 31, 2016 End of progress report period: February 06, 2016  Today's Date: 02/06/2016 PT Individual Time: 1405-1430 PT Individual Time Calculation (min): 25 min    Patient has met 4 of 4 short term goals.  Patient currently performs supine>sit with S however requires modA for sit >supine for LE management d/t pain. Performing sit <>stand and stand pivot transfers with min guard using RW and able to maintain RLE NWB status with min verbal cues. Wife has been present for several sessions to observe patient progress however has not participated in any hands-on training at this point.   Patient continues to demonstrate the following deficits: activity tolerance, balance, postural control, ability to compensate for deficits, functional use of  right upper extremity, right lower extremity, left upper extremity and left lower extremity, coordination and knowledge of precautions and therefore will continue to benefit from skilled PT intervention to enhance overall performance with bed mobility, transfers, w/c management, home and community access.  Patient progressing toward long term goals..  Continue plan of care.  PT Short Term Goals Week 3:  PT Short Term Goal 1 (Week 3): Pt will perform bed mobility minA PT Short Term Goal 1 - Progress (Week 3): Partly met PT Short Term Goal 2 (Week 3): Pt will perform stand pivot transfer with minA PT Short Term Goal 2 - Progress (Week 3): Met PT Short Term Goal 3 (Week 3): Pt will propel w/c x50' with BUE and minA PT Short Term Goal 3 - Progress (Week 3): Met PT Short Term Goal 4 (Week 3): Pt will demonstrate performance of LE strengthening/ROM HEP with modI PT Short Term Goal 4 - Progress (Week 3): Met Week 4:  PT Short Term Goal 1 (Week 4): =LTG due to estimated LOS   Skilled Therapeutic  Interventions/Progress Updates:   Pt received seated on BSC attempting to have a bowel movement; RN not available at this time to assist with disimpaction. Pt sat several more minutes to attempt to complete bowel movement. Sit <>stand with close S using BUEs on arms of BSC and transitioned to RW for support. Standing tolerance x2 min while therapist performing hygiene and clothing management totalA. Stand pivot transfer with RW and close S. Sit >supine with modA for BLE management. Repositioned in bed using bedrails and S. Remained supine in bed with all needs in reach and wife present at end of session.   Therapy Documentation Precautions:  Precautions Precautions: Fall Restrictions Weight Bearing Restrictions: Yes RLE Weight Bearing: Non weight bearing LLE Weight Bearing: Weight bearing as tolerated Other Position/Activity Restrictions: LLE WBAT for transfers only   See Function Navigator for Current Functional Status.  Therapy/Group: Individual Therapy  [name] 02/06/2016, 2:31 PM

## 2016-02-07 ENCOUNTER — Ambulatory Visit (HOSPITAL_COMMUNITY): Payer: Medicare Other

## 2016-02-07 ENCOUNTER — Inpatient Hospital Stay (HOSPITAL_COMMUNITY): Payer: Medicare Other | Admitting: Physical Therapy

## 2016-02-07 ENCOUNTER — Inpatient Hospital Stay (HOSPITAL_COMMUNITY): Payer: Medicare Other | Admitting: Occupational Therapy

## 2016-02-07 LAB — GLUCOSE, CAPILLARY
GLUCOSE-CAPILLARY: 106 mg/dL — AB (ref 65–99)
GLUCOSE-CAPILLARY: 151 mg/dL — AB (ref 65–99)
GLUCOSE-CAPILLARY: 82 mg/dL (ref 65–99)
GLUCOSE-CAPILLARY: 97 mg/dL (ref 65–99)

## 2016-02-07 NOTE — Progress Notes (Signed)
Occupational Therapy Session Note  Patient Details  Name: CHOU BELDIN MRN: [id number] Date of Birth: 14-Oct-1940  Today's Date: 02/07/2016 OT Individual Time: 1416-1500 OT Individual Time Calculation (min): 44 min     Skilled Therapeutic Interventions/Progress Updates:    Pt transferred from bed to [facility] during session with min assist for sit to stand from the EOB.  Pt with bowel accident in brief and wanted to transfer to 3:1 to finish and work on cleanup.  Pt needed max assist for thorough clothing management and toilet hygiene sit to stand.  He was able to let go with the LUE and complete some hygiene on his on in the front.  Noted pt still placing the RLE on the floor when completing this.  Once toileting was completed and pt transferred back to bed, he needed mod assist for lifting the RLE into the bed.  Pt with report of nausea so not wanting to get back up.  Nursing made aware and ginger ale brought along with aroma therapy.  Pt agreed to complete some LE exercises from bed level to complete session.  He completed 1 set of 10 repetitions for each LE for short arc quads with hip flexion with knees bent, and heel raises.  He needed min assist for hip flexion with knees bent on the right to keep the femur from externally rotating and mod assist for straight leg raises. Supervision for all exercises on the left except straight leg raises which he needed min assist to complete.     Therapy Documentation Precautions:  Precautions Precautions: Fall Restrictions Weight Bearing Restrictions: Yes RLE Weight Bearing: Non weight bearing LLE Weight Bearing: Weight bearing as tolerated Other Position/Activity Restrictions: LLE WBAT for transfers only  Pain: Pain Assessment Pain Assessment: Faces Pain Score: 2  Pain Type: Acute pain Pain Location: Sacrum Pain Intervention(s): Repositioned ADL: See Function Navigator for Current Functional Status.   Therapy/Group: Individual  Therapy  Montreal Steidle OTR/L 02/07/2016, 4:39 PM

## 2016-02-07 NOTE — Progress Notes (Signed)
Occupational Therapy Session Note  Patient Details  Name: Christian [name] MRN: [id number] Date of Birth: June 11, 1940  Today's Date: 02/07/2016 OT Individual Time: 1000-1100 OT Individual Time Calculation (min): 60 min     Short Term Goals:Week 4:  OT Short Term Goal 1 (Week 4): LTG=STG due to upcoming d/c  Skilled Therapeutic Interventions/Progress Updates:    Pt seen for OT session focusing on ADL re-training. Pt in supine upon arrival, agreeable totx session. He voiced feeling that he may have had incontinent BM. He rolled with supervision for hygiene and new brief to be donned. RN called in to change dressing as bandage had been soiled by stool. Educated pt regarding role of HHOT, HHPT, and [place] nursing for d/c needs. Pt slightly concerned about wife's ability to complete dressing change as she is sensitive to smell. Will cont to address with nursing as well as her ability to assist pt with toileting needs.  Pt declined getting to EOB to change pants, opting to complete in supine. Completed with use of reacher and VCs for problem solving.  Attempted to have pt come into long sitting to change shirt, however, pt cont to decline attempting new tasks. He transferred to EOB using hospital bed functions. Clean shirt donned with set-up. Therapist applied lotion to back for comfort.  Pt returned to bed with min A to manage R LE. Attempted to have pt trial use of leg reacher to assist with R LE, however, pt cont to state wife will assist at home. Have progressed from mod to min A for returning to supine which will assist wife with decreasing caregiver burden with heavy lifting. Pt left in supine upon arrival, positioned with pillows for comfort, RN present.   Therapy Documentation Precautions:  Precautions Precautions: Fall Restrictions Weight Bearing Restrictions: Yes RLE Weight Bearing: Non weight bearing LLE Weight Bearing: Weight bearing as tolerated Other Position/Activity Restrictions: LLE  WBAT for transfers only ADL: ADL ADL Comments: see Functional Assessment  See Function Navigator for Current Functional Status.   Therapy/Group: Individual Therapy  [name], Blayke Pinera C 02/07/2016, 7:05 AM

## 2016-02-07 NOTE — Progress Notes (Signed)
[facility]     PROGRESS NOTE    Subjective/Complaints: No new issues. Pain under control at present. Happy that he was able to sit on the commode to empty bowels.   ROS: Pt denies fever, rash/itching, headache, blurred or double vision, nausea, vomiting, abdominal pain, diarrhea, chest pain, shortness of breath, palpitations, dysuria, dizziness,   anxiety, or depression  Objective: Vital Signs: Blood pressure 139/63, pulse 78, temperature 98.2 F (36.8 C), temperature source Oral, resp. rate 19, height 6\' 2"  (1.88 m), weight 93.9 kg (207 lb), SpO2 99 %. Dg Abd Portable 1v  Result Date: 02/05/2016 CLINICAL DATA:  Nausea and vomiting. EXAM: PORTABLE ABDOMEN - 1 VIEW COMPARISON:  01/16/2016. FINDINGS: Surgical screws are noted traversing the pelvis in stable position. Drainage catheter noted over the lower pelvis in stable position. Several air-filled loops of small bowel noted. Colonic gas pattern is normal. Very mild adynamic ileus cannot be entirely excluded. Prominent amount of stool noted throughout the colon rectum suggesting constipation . IMPRESSION: 1. Prominent amount of stool noted throughout the colon rectum suggesting constipation. 2. Very mild prominence of several small bowel loops noted. Mild adynamic ileus cannot be excluded. Follow-up exam can be obtained to demonstrate resolution. Electronically Signed   By: Marcello Moores  Register   On: 02/05/2016 10:20   No results for input(s): WBC, HGB, HCT, PLT in the last 72 hours. No results for input(s): NA, K, CL, GLUCOSE, BUN, CREATININE, CALCIUM in the last 72 hours.  Invalid input(s): CO CBG (last 3)   Recent Labs  02/06/16 1633 02/06/16 2113 02/07/16 0643  GLUCAP 120* 95 106*    Wt Readings from Last 3 Encounters:  02/06/16 93.9 kg (207 lb)  01/10/16 100.7 kg (222 lb)    Physical Exam:  Constitutional: He appears well-developed. NAD. HENT: Normocephalicand atraumatic.  Eyes: EOMare  normal. No discharge.  Cardiovascular: Normal rate, regular rhythmand normal heart sounds.  Respiratory: Effort normaland breath sounds normal. No respiratory distress. He has no wheezes. He has no rales.  GI: Soft. Bowel sounds are normal. He exhibits no distension. There is no tenderness.  Genitourinary: +SPC in place Musculoskeletal: He exhibits edema. Right leg tender to palpation. Neurological: He is alertand oriented.  UE 5/5 prox to distal.  RLE: 1/5 HF, KE 3/5 ADF/PF.  LLE: 3/5 HF, 3/5 KE and 4/5 ADF/PF.   Skin: Skin is warm. Sacral wound covered with vac without leaks. Suprapubic catheter site clean   Psychiatric: He has a normal mood and affect. His behavior is normal. Judgmentand thought contentnormal  Assessment/Plan: 1. Functional and mobility deficits secondary to polytrauma which require 3+ hours per day of interdisciplinary therapy in a comprehensive inpatient rehab setting. Physiatrist is providing close team supervision and 24 hour management of active medical problems listed below. Physiatrist and rehab team continue to assess barriers to discharge/monitor patient progress toward functional and medical goals.  Function:  Bathing Bathing position   Position: Other (comment) (sitting on BSC)  Bathing parts Body parts bathed by patient: Right arm, Left arm, Chest, Abdomen, Right upper leg, Left upper leg Body parts bathed by helper: Front perineal area, Buttocks, Back  Bathing assist Assist Level: Touching or steadying assistance(Pt > 75%)      Upper Body Dressing/Undressing Upper body dressing   What is the patient wearing?: Pull over shirt/dress     Pull over shirt/dress - Perfomed by patient: Thread/unthread right sleeve, Thread/unthread left sleeve, Put head through opening, Pull shirt over trunk Pull over  shirt/dress - Perfomed by helper: Pull shirt over trunk        Upper body assist Assist Level: Set up      Lower Body Dressing/Undressing Lower  body dressing   What is the patient wearing?: Pants   Underwear - Performed by helper: Thread/unthread left underwear leg, Pull underwear up/down, Thread/unthread right underwear leg Pants- Performed by patient: Thread/unthread right pants leg, Thread/unthread left pants leg, Pull pants up/down Pants- Performed by helper: Thread/unthread right pants leg, Thread/unthread left pants leg, Pull pants up/down   Non-skid slipper socks- Performed by helper: Don/doff right sock, Don/doff left sock       Shoes - Performed by helper: Don/doff right shoe, Don/doff left shoe       TED Hose - Performed by helper: Don/doff right TED hose, Don/doff left TED hose  Lower body assist Assist for lower body dressing: Touching or steadying assistance (Pt > 75%)      Toileting Toileting Toileting activity did not occur: No continent bowel/bladder event   Toileting steps completed by helper: Adjust clothing prior to toileting, Performs perineal hygiene, Adjust clothing after toileting Toileting Assistive Devices: Toilet aid  Toileting assist Assist level: Touching or steadying assistance (Pt.75%)   Transfers Chair/bed transfer Chair/bed transfer activity did not occur: Safety/medical concerns Chair/bed transfer method: Stand pivot Chair/bed transfer assist level: Supervision or verbal cues Chair/bed transfer assistive device: Walker, Bedrails, Armrests     Locomotion Ambulation Ambulation activity did not occur: Safety/medical concerns         Wheelchair Wheelchair activity did not occur: Safety/medical concerns Type: Manual Max wheelchair distance: 200 Assist Level: Supervision or verbal cues  Cognition Comprehension Comprehension assist level: Follows complex conversation/direction with no assist  Expression Expression assist level: Expresses complex ideas: With no assist  Social Interaction Social Interaction assist level: Interacts appropriately with others - No medications needed.   Problem Solving Problem solving assist level: Solves complex problems: Recognizes & self-corrects  Memory Memory assist level: Recognizes or recalls 90% of the time/requires cueing < 10% of the time   Medical Problem List and Plan: 1.  Multi pelvic fractures status post sacroiliac screw fixation secondary to trauma.   -CIR PT/OT             -Touchdown weightbearing right leg to facilitate transfers.              -Weightbearing as tolerated left leg for transfers only 8 weeks,  2.  DVT Prophylaxis/Anticoagulation: SQ Lovenox.  -vascular study normal 3. Pain Management: Neurontin 300 mg TID  -continue fentanyl patch at 39mcg   -oxycodone prn/ with two scheduled doses to coincide with therapy 4. Acute blood loss anemia.  FeSo4 324 mg bid.    CBC Latest Ref Rng & Units 01/30/2016 01/20/2016 01/18/2016  WBC 4.0 - 10.5 K/uL 7.8 10.1 10.9(H)  Hemoglobin 13.0 - 17.0 g/dL 9.2(L) 7.7(L) 7.5(L)  Hematocrit 39.0 - 52.0 % 30.5(L) 24.8(L) 24.0(L)  Platelets 150 - 400 K/uL 400 256 246   5. Neuropsych: This patient is capable of making decisions on his own behalf. 6. Skin/Wound Care/Sacral wound: air mattress.  -s/p I&D, placement of Acell 10/19 by Dr. Marla Roe.  -for further I &D now Monday---will need to go home with a vac   -pt to be off wound unless he's on air mattress 7. Fluids/Electrolytes/Nutrition: encourage PO 8. Urethral trauma. Status post suprapubic catheter. Follow-up with changes as directed. Urine remains clear/yellow   9. Diabetes mellitus with peripheral neuropathy. Hemoglobin A1c 7.9.  Levemir 15 units daily at bedtime  Glucophage 1000 mg twice a day  Glucotrol 10 mg before breakfast and 5 mg before supper  -good control---continue current medication schedule for now   CBG (last 3)   Recent Labs  02/06/16 1633 02/06/16 2113 02/07/16 0643  GLUCAP 120* 95 106*   -treat any spikes with SSI  10. Hypertension. Norvasc increased to 10mg  on 10/22.  Vitals:   02/07/16 0614  02/07/16 0834  BP: 136/64 139/63  Pulse: 78   Resp: 19   Temp: 98.2 F (36.8 C)    11. Hyperlipidemia. Lipitor 12. Constipation vs mild ileus   -had a bm yesterday on commode   LOS (Days) 21 A FACE TO FACE EVALUATION WAS PERFORMED  Cherelle Midkiff T 02/07/2016 9:14 AM

## 2016-02-07 NOTE — Progress Notes (Signed)
Physical Therapy Session Note  Patient Details  Name: Christian [name] MRN: [id number] Date of Birth: October 25, 1940  Today's Date: 02/07/2016 PT Individual Time: 0800-0900 PT Individual Time Calculation (min): 60 min    Short Term Goals: Week 4:  PT Short Term Goal 1 (Week 4): =LTG due to estimated LOS  Skilled Therapeutic Interventions/Progress Updates:   Pt received supine in bed, c/o pain 3/10 in R hip and agreeable to treatment; received medication from RN during session for pain. Supine>sit with bedrails, HOB elevated and increased time with wife providing S. Sit >stand with RW, and stand pivot transfer to w/c with S. Pt given 18x18 w/c due to wife's concern that 20 inch w/c won't fit through doorways at home. Pt fits appropriately and tolerates sitting in 18x18 chair with standard leg rests. W/c propulsion 2x200' with BUE for strengthening and endurance, as well as independence with w/c mobility in home at d/c. Stand pivot transfer w/c <>simulated car with RW and S, requires minA to assist RLE into car, able to pull RLE out of car without assist. Returned to room and performed stand pivot transfer to bed with S. Discussed goal of S goal for bed mobility to include sit >supine to reduce caregiver burden; pt able to perform with minA for RLE into bed, able to pull LLE into bed without assist, requiring only 10% assist for RLE. Remained supine in bed with all needs in reach and RN present at end of session.   Therapy Documentation Precautions:  Precautions Precautions: Fall Restrictions Weight Bearing Restrictions: Yes RLE Weight Bearing: Non weight bearing LLE Weight Bearing: Weight bearing as tolerated Other Position/Activity Restrictions: LLE WBAT for transfers only  See Function Navigator for Current Functional Status.   Therapy/Group: Individual Therapy  [name] 02/07/2016, 9:08 AM

## 2016-02-08 ENCOUNTER — Inpatient Hospital Stay (HOSPITAL_COMMUNITY): Payer: Medicare Other | Admitting: Physical Therapy

## 2016-02-08 ENCOUNTER — Inpatient Hospital Stay (HOSPITAL_COMMUNITY): Payer: Medicare Other | Admitting: Occupational Therapy

## 2016-02-08 LAB — GLUCOSE, CAPILLARY
GLUCOSE-CAPILLARY: 170 mg/dL — AB (ref 65–99)
Glucose-Capillary: 89 mg/dL (ref 65–99)
Glucose-Capillary: 163 mg/dL — ABNORMAL HIGH (ref 65–99)
Glucose-Capillary: 98 mg/dL (ref 65–99)

## 2016-02-08 MED ORDER — SENNOSIDES-DOCUSATE SODIUM 8.6-50 MG PO TABS
2.0000 | ORAL_TABLET | Freq: Two times a day (BID) | ORAL | Status: DC
  Administered 2016-02-09 – 2016-02-11 (×4): 2 via ORAL
  Administered 2016-02-11: 1 via ORAL
  Administered 2016-02-12: 2 via ORAL
  Filled 2016-02-08 (×7): qty 2

## 2016-02-08 NOTE — Progress Notes (Signed)
[facility]     PROGRESS NOTE    Subjective/Complaints: Moved bowels numerous times/loose after bowel intervention    ROS: Pt denies fever, rash/itching, headache, blurred or double vision, nausea, vomiting, abdominal pain, diarrhea, chest pain, shortness of breath, palpitations, dysuria, dizziness,   anxiety, or depression  Objective: Vital Signs: Blood pressure (!) 126/58, pulse 64, temperature 98.9 F (37.2 C), temperature source Oral, resp. rate 16, height 6\' 2"  (1.88 m), weight 93.9 kg (207 lb), SpO2 98 %. No results found. No results for input(s): WBC, HGB, HCT, PLT in the last 72 hours. No results for input(s): NA, K, CL, GLUCOSE, BUN, CREATININE, CALCIUM in the last 72 hours.  Invalid input(s): CO CBG (last 3)   Recent Labs  02/07/16 1135 02/07/16 1626 02/07/16 2029  GLUCAP 151* 82 97    Wt Readings from Last 3 Encounters:  02/06/16 93.9 kg (207 lb)  01/10/16 100.7 kg (222 lb)    Physical Exam:  Constitutional: He appears well-developed. NAD. HENT: Normocephalicand atraumatic.  Eyes: EOMare normal. No discharge.  Cardiovascular: Normal rate, regular rhythmand normal heart sounds. intact pulses Respiratory: Effort normaland breath sounds normal. No respiratory distress. He has no wheezes. He has no rales.  GI: Soft. Bowel sounds are normal. He exhibits no distension. There is no tenderness.  Genitourinary: +SPC in place Musculoskeletal: He exhibits edema. Right leg less tender to palpation. Neurological: He is alertand oriented.  UE 5/5 prox to distal.  RLE: 2/5 HF, KE 3/5 ADF/PF.  LLE: 3/5 HF, 3/5 KE and 4/5 ADF/PF.   Skin: Skin is warm. Sacral wound dressed with mild odor. Suprapubic catheter site clean   Psychiatric: He has a normal mood and affect. His behavior is normal. Judgmentand thought contentnormal  Assessment/Plan: 1. Functional and mobility deficits secondary to polytrauma which require 3+ hours per day  of interdisciplinary therapy in a comprehensive inpatient rehab setting. Physiatrist is providing close team supervision and 24 hour management of active medical problems listed below. Physiatrist and rehab team continue to assess barriers to discharge/monitor patient progress toward functional and medical goals.  Function:  Bathing Bathing position   Position: Other (comment) (sitting on BSC)  Bathing parts Body parts bathed by patient: Right arm, Left arm, Chest, Abdomen, Right upper leg, Left upper leg Body parts bathed by helper: Front perineal area, Buttocks, Back  Bathing assist Assist Level: Touching or steadying assistance(Pt > 75%)      Upper Body Dressing/Undressing Upper body dressing   What is the patient wearing?: Pull over shirt/dress     Pull over shirt/dress - Perfomed by patient: Thread/unthread right sleeve, Thread/unthread left sleeve, Put head through opening, Pull shirt over trunk Pull over shirt/dress - Perfomed by helper: Pull shirt over trunk        Upper body assist Assist Level: Set up, Supervision or verbal cues   Set up : To obtain clothing/put away  Lower Body Dressing/Undressing Lower body dressing   What is the patient wearing?: Pants   Underwear - Performed by helper: Thread/unthread left underwear leg, Pull underwear up/down, Thread/unthread right underwear leg Pants- Performed by patient: Thread/unthread right pants leg, Thread/unthread left pants leg, Pull pants up/down Pants- Performed by helper: Thread/unthread right pants leg, Thread/unthread left pants leg, Pull pants up/down   Non-skid slipper socks- Performed by helper: Don/doff right sock, Don/doff left sock       Shoes - Performed by helper: Don/doff right shoe, Don/doff left shoe  TED Hose - Performed by helper: Don/doff right TED hose, Don/doff left TED hose  Lower body assist Assist for lower body dressing: Set up, Supervision or verbal cues   Set up : To obtain  clothing/put away  Toileting Toileting Toileting activity did not occur: No continent bowel/bladder event   Toileting steps completed by helper: Performs perineal hygiene, Adjust clothing prior to toileting, Adjust clothing after toileting [place]: Toilet aid  Toileting assist Assist level: Touching or steadying assistance (Pt.75%)   Transfers Chair/bed transfer Chair/bed transfer activity did not occur: Safety/medical concerns Chair/bed transfer method: Stand pivot Chair/bed transfer assist level: Supervision or verbal cues Chair/bed transfer assistive device: Walker, Bedrails, Armrests     Locomotion Ambulation Ambulation activity did not occur: Safety/medical concerns         Wheelchair Wheelchair activity did not occur: Safety/medical concerns Type: Manual Max wheelchair distance: 200 Assist Level: Supervision or verbal cues  Cognition Comprehension Comprehension assist level: Follows complex conversation/direction with no assist  Expression Expression assist level: Expresses complex ideas: With no assist  Social Interaction Social Interaction assist level: Interacts appropriately with others - No medications needed.  Problem Solving Problem solving assist level: Solves complex problems: Recognizes & self-corrects  Memory Memory assist level: Recognizes or recalls 90% of the time/requires cueing < 10% of the time   Medical Problem List and Plan: 1.  Multi pelvic fractures status post sacroiliac screw fixation secondary to trauma.   -CIR PT/OT             -Touchdown weightbearing right leg to facilitate transfers.              -Weightbearing as tolerated left leg for transfers only 8 weeks,  2.  DVT Prophylaxis/Anticoagulation: SQ Lovenox.  -vascular study normal 3. Pain Management: Neurontin 300 mg TID  -continue fentanyl patch at 69mcg   -oxycodone prn/ with two scheduled doses to coincide with therapy 4. Acute blood loss anemia.  FeSo4 324 mg bid.     CBC Latest Ref Rng & Units 01/30/2016 01/20/2016 01/18/2016  WBC 4.0 - 10.5 K/uL 7.8 10.1 10.9(H)  Hemoglobin 13.0 - 17.0 g/dL 9.2(L) 7.7(L) 7.5(L)  Hematocrit 39.0 - 52.0 % 30.5(L) 24.8(L) 24.0(L)  Platelets 150 - 400 K/uL 400 256 246   5. Neuropsych: This patient is capable of making decisions on his own behalf. 6. Skin/Wound Care/Sacral wound: air mattress.  -s/p I&D, placement of Acell 10/19 by Dr. Marla Roe.  -for further I &D now Monday---will need to go home with a vac   -pt to be off wound unless he's on air mattress 7. Fluids/Electrolytes/Nutrition: encourage PO 8. Urethral trauma. Status post suprapubic catheter. Follow-up with changes as directed. Urine remains clear/yellow   9. Diabetes mellitus with peripheral neuropathy. Hemoglobin A1c 7.9.  Levemir 15 units daily at bedtime  Glucophage 1000 mg twice a day  Glucotrol 10 mg before breakfast and 5 mg before supper  -good control at present   CBG (last 3)   Recent Labs  02/07/16 1135 02/07/16 1626 02/07/16 2029  GLUCAP 151* 82 97   -treat any spikes with SSI  10. Hypertension. Norvasc increased to 10mg  on 10/22.  Vitals:   02/07/16 1335 02/08/16 0548  BP: (!) 151/74 (!) 126/58  Pulse: (!) 109 64  Resp: 20 16  Temp: 97.9 F (36.6 C) 98.9 F (37.2 C)   11. Hyperlipidemia. Lipitor 12. Constipation vs mild ileus   -still trying to find ideal prophylactic regimen---doesn't do well with rescue regimen  LOS (Days) 22 A FACE TO FACE EVALUATION WAS PERFORMED  Dwyane Dupree T 02/08/2016 9:59 AM

## 2016-02-08 NOTE — Progress Notes (Signed)
Occupational Therapy Session Note  Patient Details  Name: Christian [name] MRN: [id number] Date of Birth: [birth year]  Today's Date: 02/08/2016 OT Individual Time: 5329-9242 OT Individual Time Calculation (min): 58 min   Short Term Goals: Week 1:  OT Short Term Goal 1 (Week 1): Patient will complete upper body bathind and dressing at sink with min assist at w/c level. OT Short Term Goal 1 - Progress (Week 1): Not met OT Short Term Goal 2 (Week 1): Patient will complete SPT from w/c to toilet with mod assist OT Short Term Goal 2 - Progress (Week 1): Not met OT Short Term Goal 3 (Week 1): Pt will perform bed mobility using DME/AE, prn, with mod assist to manage BLE OT Short Term Goal 3 - Progress (Week 1): Not met OT Short Term Goal 4 (Week 1): Patient will don pants with max assist to stand supported to pull up pants. OT Short Term Goal 4 - Progress (Week 1): Not met OT Short Term Goal 5 (Week 1): Patient will complete lower body bathing at bed level with mod assist to wash buttocks and lower legs using AE, prn. OT Short Term Goal 5 - Progress (Week 1): Met Week 2:  OT Short Term Goal 1 (Week 2): Pt will tolerate 2 hours OOB in order to increase upright tolerance and functional positioning OT Short Term Goal 1 - Progress (Week 2): Not met OT Short Term Goal 2 (Week 2): Pt will compete UB dressing in seated position (w/c or EOB) with set-up/ supervision OT Short Term Goal 2 - Progress (Week 2): Progressing toward goal OT Short Term Goal 3 (Week 2): Pt will complete 3 grooming tasks seated in w/c with set-up OT Short Term Goal 3 - Progress (Week 2): Progressing toward goal OT Short Term Goal 4 (Week 2): Pt will transfer with assist of +1 in order to decrease caregiver burden OT Short Term Goal 4 - Progress (Week 2): Progressing toward goal Week 3:  OT Short Term Goal 1 (Week 3): LTG=STG OT Short Term Goal 1 - Progress (Week 3): Progressing toward goal Week 4:  OT Short Term Goal 1 (Week 4):  LTG=STG due to upcoming d/c  Skilled Therapeutic Interventions/Progress Updates:   Pt was lying in bed at time of arrival and agreeable to complete UB ADLs, shaving, and oral care/grooming tasks at sink this morning. Pt reported that nursing completed LB self care prior to session. Supine<sit completed with supervision. Bed<w/c transfer completed with RW and supervision with cues for elevating R LE off of floor to adhere to NWB precautions. Pt kept R LE off of floor 50% of time during tx. UB ADLs and grooming tasks completed w/c level at sink with setup-Min A. Attempted to engage pt in donning socks with use of footstool with pt reporting that it was too difficult. Pt still refuses to use reacher, LH sponge, or AE for self care due to report of wanting wife to complete at home. "She'll take care of all those things." Pt educated on purpose of AE for increasing levels of independence with verbalized understanding and continued to refuse. At end of tx pt was transferred back to bed and completed air bicycle exercises in supine with therapist assist for keeping R LE adducted during hip flexion. At end of tx pt was left with all needs within reach.   Therapy Documentation Precautions:  Precautions Precautions: Fall Restrictions Weight Bearing Restrictions: Yes RLE Weight Bearing: Non weight bearing LLE Weight Bearing: Weight bearing  as tolerated Other Position/Activity Restrictions: LLE WBAT for transfers only  Pain: No c/o pain during session  Pain Assessment Pain Assessment: 0-10 Pain Score: 6  Pain Type: Acute pain Pain Location: Sacrum Pain Intervention(s): Other (Comment) (pain meds received prior to session) ADL: ADL ADL Comments: see Functional Assessment    See Function Navigator for Current Functional Status.   Therapy/Group: Individual Therapy  Rayleen Wyrick A Rihan Schueler 02/08/2016, 12:41 PM

## 2016-02-08 NOTE — Progress Notes (Signed)
Physical Therapy Session Note  Patient Details  Name: Christian [name] MRN: [id number] Date of Birth: August 07, 1940  Today's Date: 02/08/2016 PT Individual Time: 1400-1453 PT Individual Time Calculation (min): 53 min    Short Term Goals: Week 4:  PT Short Term Goal 1 (Week 4): =LTG due to estimated LOS  Skilled Therapeutic Interventions/Progress Updates:   Pt presented in bed agreeable to therapy. Performed supine to sit at EOB with close supervision HOB elevated and use of rails. Sit to stand with bed elevated and stand pivot to w/c.  Pt instructed in placement and locking of leg rests which pt was able to demonstrate. Propelled around unit into dayroom appropriately negotiating carpet and thresholds. Propelled to elevator and pt was able to turn around inside elevate without bumping into walls. Pt propelled outside on brick surfaces up/down ramp and sidewalk with uneven surfaces.  Pt able to successfully negotiate with min cues.  Pt required occasional breaks 2/2 fatigue. Pt returned to room and returned to bed in same manner as above.  Left pt in bed with all needs met.    Therapy Documentation Precautions:  Precautions Precautions: Fall Restrictions Weight Bearing Restrictions: Yes RLE Weight Bearing: Non weight bearing LLE Weight Bearing: Weight bearing as tolerated Other Position/Activity Restrictions: LLE WBAT for transfers only General:   Vital Signs:  Pain:   Mobility:   Locomotion :    Trunk/Postural Assessment :    Balance:   Exercises:   Other Treatments:     See Function Navigator for Current Functional Status.   Therapy/Group: Individual Therapy  Sheikh Leverich  Dannel Rafter, PTA  02/08/2016, 2:56 PM

## 2016-02-08 NOTE — Progress Notes (Signed)
Physical Therapy Session Note  Patient Details  Name: Christian [name] MRN: [id number] Date of Birth: 10-17-1940  Today's Date: 02/08/2016 PT Individual Time: 1000-1100 PT Individual Time Calculation (min): 60 min    Short Term Goals: Week 4:  PT Short Term Goal 1 (Week 4): =LTG due to estimated LOS  Skilled Therapeutic Interventions/Progress Updates:   Pt presented in bed with increased c/o pain at present.  Pt stating having recently received pain meds from nsg.  Pt refused OOB activities at present 2/2 pain however agreeable to participate in supine therex. Pt participated in following therex, AAROM progressing to AROM SLR,  abd, manually resisted heel slides LLE, AROM heel slides RLE, Level 2 resistance bands ER, SAQ with 3 second holds. All therex performed 10-12 reps. Pt required frequent rest breaks 2/2 pain and fatigue. Pt then requesting to use BSC for BM, performed supine to sit with supervision with HOB elevated and use of bed rail.  Pt required add'l time however no assistance for LE placement. Stand pivot to [facility].  Pt able to tolerate standing for approx 2 min while PTA performed peri care. Pt returned to bed in same manner as previous and performed sit to supine with CGA/close S with pt able to lift BLE and clear slight lip of bed railing. Pt able to reposition self mod I with use of bed railing and left in bed with all needs met.   Therapy Documentation Precautions:  Precautions Precautions: Fall Restrictions Weight Bearing Restrictions: Yes RLE Weight Bearing: Non weight bearing LLE Weight Bearing: Weight bearing as tolerated Other Position/Activity Restrictions: LLE WBAT for transfers only General:   Vital Signs:  Pain: Pain Assessment Pain Assessment: 0-10 Pain Score: 6  Pain Type: Acute pain Pain Location: Sacrum Pain Intervention(s): Other (Comment) (pain meds received prior to session) Mobility:   Locomotion :    Trunk/Postural Assessment :    Balance:    Exercises:   Other Treatments:     See Function Navigator for Current Functional Status.   Therapy/Group: Individual Therapy  Crystina Borrayo  Emree Locicero, PTA  02/08/2016, 12:25 PM

## 2016-02-09 ENCOUNTER — Inpatient Hospital Stay (HOSPITAL_COMMUNITY): Payer: Medicare Other

## 2016-02-09 ENCOUNTER — Inpatient Hospital Stay (HOSPITAL_COMMUNITY): Payer: Medicare Other | Admitting: Occupational Therapy

## 2016-02-09 LAB — GLUCOSE, CAPILLARY
GLUCOSE-CAPILLARY: 141 mg/dL — AB (ref 65–99)
Glucose-Capillary: 92 mg/dL (ref 65–99)
Glucose-Capillary: 170 mg/dL — ABNORMAL HIGH (ref 65–99)
Glucose-Capillary: 142 mg/dL — ABNORMAL HIGH (ref 65–99)

## 2016-02-09 MED ORDER — CEFAZOLIN SODIUM-DEXTROSE 2-4 GM/100ML-% IV SOLN
2.0000 g | INTRAVENOUS | Status: AC
  Administered 2016-02-10: 2 g via INTRAVENOUS

## 2016-02-09 NOTE — Progress Notes (Signed)
[place] PHYSICAL MEDICINE & REHABILITATION     PROGRESS NOTE    Subjective/Complaints: Good night. Better activity tolerance.   ROS: Pt denies fever, rash/itching, headache, blurred or double vision, nausea, vomiting, abdominal pain, diarrhea, chest pain, shortness of breath, palpitations, dysuria, dizziness, neck or back pain, bleeding, anxiety, or depression  Objective: Vital Signs: Blood pressure (!) 150/70, pulse 87, temperature 97.9 F (36.6 C), temperature source Oral, resp. rate 18, height 6\' 2"  (1.88 m), weight 93.9 kg (207 lb), SpO2 98 %. No results found. No results for input(s): WBC, HGB, HCT, PLT in the last 72 hours. No results for input(s): NA, K, CL, GLUCOSE, BUN, CREATININE, CALCIUM in the last 72 hours.  Invalid input(s): CO CBG (last 3)   Recent Labs  02/08/16 2048 02/09/16 0619 02/09/16 1145  GLUCAP 170* 92 170*    Wt Readings from Last 3 Encounters:  02/06/16 93.9 kg (207 lb)  01/10/16 100.7 kg (222 lb)    Physical Exam:  Constitutional: He appears well-developed. NAD. HENT: Normocephalicand atraumatic.  Eyes: EOMare normal. No discharge.  Cardiovascular: Normal rate, regular rhythmand normal heart sounds. intact pulses Respiratory: Effort normaland breath sounds normal. No respiratory distress. He has no wheezes. He has no rales.  GI: Soft. Bowel sounds are normal. He exhibits no distension. There is no tenderness.  Genitourinary: +SPC in place Musculoskeletal: He exhibits edema right greater than left lower ext  Neurological: He is alertand oriented.  UE 5/5 prox to distal.  RLE: 2/5 HF, KE 3/5 ADF/PF.  LLE: 3/5 HF, 3/5 KE and 4/5 ADF/PF.   Skin: Skin is warm. Sacral wound dressed with mild odor. Suprapubic catheter site clean   Psychiatric: He has a normal mood and affect.    Assessment/Plan: 1. Functional and mobility deficits secondary to polytrauma which require 3+ hours per day of interdisciplinary therapy in a comprehensive  inpatient rehab setting. Physiatrist is providing close team supervision and 24 hour management of active medical problems listed below. Physiatrist and rehab team continue to assess barriers to discharge/monitor patient progress toward functional and medical goals.  Function:  Bathing Bathing position   Position: Wheelchair/chair at sink  Bathing parts Body parts bathed by patient: Left arm, Right arm, Chest, Abdomen, Right upper leg, Left upper leg, Front perineal area Body parts bathed by helper: Buttocks, Right lower leg, Left lower leg, Back  Bathing assist Assist Level: Touching or steadying assistance(Pt > 75%)      Upper Body Dressing/Undressing Upper body dressing   What is the patient wearing?: Pull over shirt/dress     Pull over shirt/dress - Perfomed by patient: Thread/unthread right sleeve, Thread/unthread left sleeve, Put head through opening, Pull shirt over trunk Pull over shirt/dress - Perfomed by helper: Pull shirt over trunk        Upper body assist Assist Level: Set up   Set up : To obtain clothing/put away  Lower Body Dressing/Undressing Lower body dressing   What is the patient wearing?: Pants, Non-skid slipper socks   Underwear - Performed by helper: Thread/unthread left underwear leg, Pull underwear up/down, Thread/unthread right underwear leg Pants- Performed by patient: Thread/unthread right pants leg, Thread/unthread left pants leg, Pull pants up/down Pants- Performed by helper: Thread/unthread right pants leg, Thread/unthread left pants leg, Pull pants up/down, Fasten/unfasten pants   Non-skid slipper socks- Performed by helper: Don/doff right sock, Don/doff left sock       Shoes - Performed by helper: Don/doff right shoe, Don/doff left shoe  TED Hose - Performed by helper: Don/doff right TED hose, Don/doff left TED hose  Lower body assist Assist for lower body dressing:  (Total assist)   Set up : To obtain clothing/put away   Toileting Toileting Toileting activity did not occur: No continent bowel/bladder event   Toileting steps completed by helper: Adjust clothing prior to toileting, Performs perineal hygiene, Adjust clothing after toileting Toileting Assistive Devices: Toilet aid  Toileting assist Assist level:  (total assist)   Transfers Chair/bed transfer Chair/bed transfer activity did not occur: Safety/medical concerns Chair/bed transfer method: Stand pivot Chair/bed transfer assist level: Supervision or verbal cues Chair/bed transfer assistive device: Walker, Bedrails, Armrests     Locomotion Ambulation Ambulation activity did not occur: Safety/medical concerns         Wheelchair Wheelchair activity did not occur: Safety/medical concerns Type: Manual Max wheelchair distance: 300 Assist Level: Supervision or verbal cues  Cognition Comprehension Comprehension assist level: Follows complex conversation/direction with no assist  Expression Expression assist level: Expresses complex ideas: With extra time/assistive device  Social Interaction Social Interaction assist level: Interacts appropriately with others - No medications needed.  Problem Solving Problem solving assist level: Solves complex 90% of the time/cues < 10% of the time  Memory Memory assist level: More than reasonable amount of time   Medical Problem List and Plan: 1.  Multi pelvic fractures status post sacroiliac screw fixation secondary to trauma.   -CIR PT/OT continue--better tolerance             -Touchdown weightbearing right leg to facilitate transfers.              -Weightbearing as tolerated left leg for transfers only 8 weeks,  2.  DVT Prophylaxis/Anticoagulation: SQ Lovenox.  -vascular study normal 3. Pain Management: Neurontin 300 mg TID  -continue fentanyl patch at 44mcg   -oxycodone prn/ with two scheduled doses to coincide with therapy 4. Acute blood loss anemia.  FeSo4 324 mg bid.    CBC Latest Ref Rng & Units  01/30/2016 01/20/2016 01/18/2016  WBC 4.0 - 10.5 K/uL 7.8 10.1 10.9(H)  Hemoglobin 13.0 - 17.0 g/dL 9.2(L) 7.7(L) 7.5(L)  Hematocrit 39.0 - 52.0 % 30.5(L) 24.8(L) 24.0(L)  Platelets 150 - 400 K/uL 400 256 246   5. Neuropsych: This patient is capable of making decisions on his own behalf. 6. Skin/Wound Care/Sacral wound: air mattress.  -s/p I&D, placement of Acell 10/19 by Dr. Marla Roe.  -for further I &D now Monday---will need to go home with a vac   -pt to be off wound unless he's on air mattress 7. Fluids/Electrolytes/Nutrition: encourage PO 8. Urethral trauma. Status post suprapubic catheter. Follow-up with changes as directed. Urine remains clear/yellow   9. Diabetes mellitus with peripheral neuropathy. Hemoglobin A1c 7.9.  Levemir 15 units daily at bedtime  Glucophage 1000 mg twice a day  Glucotrol 10 mg before breakfast and 5 mg before supper  -fair control at present   CBG (last 3)   Recent Labs  02/08/16 2048 02/09/16 0619 02/09/16 1145  GLUCAP 170* 92 170*   -treat any spikes with SSI  10. Hypertension. Norvasc increased to 10mg  on 10/22.  Vitals:   02/09/16 0501 02/09/16 0810  BP: 134/62 (!) 150/70  Pulse: 87   Resp: 18   Temp: 97.9 F (36.6 C)    11. Hyperlipidemia. Lipitor 12. Constipation    -have further adjusted senokot-s.   -striving for ideal prophylactic regimen  LOS (Days) 23 A FACE TO FACE EVALUATION WAS PERFORMED  SWARTZ,ZACHARY T 02/09/2016 12:48 PM

## 2016-02-09 NOTE — Progress Notes (Signed)
Occupational Therapy Session Note  Patient Details  Name: Christian [name] MRN: [id number] Date of Birth: 1941/03/03  Today's Date: 02/09/2016 OT Individual Time: 1430-1531 OT Individual Time Calculation (min): 61 min   Short Term Goals: Week 1:  OT Short Term Goal 1 (Week 1): Patient will complete upper body bathind and dressing at sink with min assist at w/c level. OT Short Term Goal 1 - Progress (Week 1): Not met OT Short Term Goal 2 (Week 1): Patient will complete SPT from w/c to toilet with mod assist OT Short Term Goal 2 - Progress (Week 1): Not met OT Short Term Goal 3 (Week 1): Pt will perform bed mobility using DME/AE, prn, with mod assist to manage BLE OT Short Term Goal 3 - Progress (Week 1): Not met OT Short Term Goal 4 (Week 1): Patient will don pants with max assist to stand supported to pull up pants. OT Short Term Goal 4 - Progress (Week 1): Not met OT Short Term Goal 5 (Week 1): Patient will complete lower body bathing at bed level with mod assist to wash buttocks and lower legs using AE, prn. OT Short Term Goal 5 - Progress (Week 1): Met Week 2:  OT Short Term Goal 1 (Week 2): Pt will tolerate 2 hours OOB in order to increase upright tolerance and functional positioning OT Short Term Goal 1 - Progress (Week 2): Not met OT Short Term Goal 2 (Week 2): Pt will compete UB dressing in seated position (w/c or EOB) with set-up/ supervision OT Short Term Goal 2 - Progress (Week 2): Progressing toward goal OT Short Term Goal 3 (Week 2): Pt will complete 3 grooming tasks seated in w/c with set-up OT Short Term Goal 3 - Progress (Week 2): Progressing toward goal OT Short Term Goal 4 (Week 2): Pt will transfer with assist of +1 in order to decrease caregiver burden OT Short Term Goal 4 - Progress (Week 2): Progressing toward goal Week 3:  OT Short Term Goal 1 (Week 3): LTG=STG OT Short Term Goal 1 - Progress (Week 3): Progressing toward goal Week 4:  OT Short Term Goal 1 (Week 4):  LTG=STG due to upcoming d/c  Skilled Therapeutic Interventions/Progress Updates:   Pt participated in skilled OT session focusing on caregiver education/training (for spouse Christian [name]), functional transfers, d/c planning, and therapeutic exercise. Pt was lying supine in bed at time of arrival with family members present. Pt and Christian [name] were agreeable to caregiver ed in regards to toilet transfers and adaptive methods for donning TEDs. Christian [name] added to safety plan for toilet transfers. Pt ambulated to toilet today with RW and supervision with R LE elevated off of floor. Toileting completed with Total A. Extensive discussion with Christian [name] and pt completed regarding d/c, DME, and grad day with verbalized understanding. Christian [name] reported that she will be present for therapies tomorrow for BADL caregiver education (pt is scheduled for surgery at 11AM tomorrow). Pt refused to participate in kitchen activities 2/2 to [place] with meals at home. Air bicycles and knee flexion/extension exercises x10 reps 2 sets completed while supine in bed for remainder of session. At time of departure pt was left with family and all needs within reach.   Therapy Documentation Precautions:  Precautions Precautions: Fall Restrictions Weight Bearing Restrictions: Yes RLE Weight Bearing: Non weight bearing LLE Weight Bearing: Weight bearing as tolerated Other Position/Activity Restrictions: LLE WBAT for transfers only  Pain: No c/o pain during session  Pain Assessment Pain Assessment: 0-10 Pain  Score: Asleep Pain Type: Acute pain Pain Location: Sacrum Pain Descriptors / Indicators: Aching Pain Frequency: Constant Pain Onset: On-going Pain Intervention(s): Medication (See eMAR) ADL: ADL ADL Comments: see Functional Assessment    See Function Navigator for Current Functional Status.   Therapy/Group: Individual Therapy  Christian [name] A Christian [name] 02/09/2016, 7:39 PM

## 2016-02-09 NOTE — Plan of Care (Signed)
Problem: RH SKIN INTEGRITY Goal: RH STG SKIN FREE OF INFECTION/BREAKDOWN Free of further skin breakdown mod assist  Outcome: Not Progressing Patient going back to surgery 02/10/16 for I/D of sacral wound

## 2016-02-09 NOTE — Progress Notes (Signed)
Occupational Therapy Session Note  Patient Details  Name: Christian [name] MRN: [id number] Date of Birth: 02-25-1941  Today's Date: 02/09/2016 OT Individual Time: 1030-1130 OT Individual Time Calculation (min): 60 min   Short Term Goals: Week 4:  OT Short Term Goal 1 (Week 4): LTG=STG due to upcoming d/c  Skilled Therapeutic Interventions/Progress Updates:   ADL-retraining seated at sink, w/c level, with focus on adapted bathing/dressing skills reinforcement training, AE retraining, adherence to NWB recautions and dynamic standing balance.   Pt received supine in bed with spouse present seated beside bed.  Pt able to articulate preferred method for bed mobility and bathing, directing caregiver appropriately for setup of bathing/dressing at sink.  Pt completed bed mobility using bed rail and transferred from EOB to w/c using RW with min vc for safety and adherence to NWB precaution.   Pt required only setup assist at sink for planned upper body BADL session.   OT advised that pt is still not approved for shower level bathing however pt requested assist to thoroughly wash his hair by leaning over sink with OT support to pour water over his head during lather and rinse of his hair.   Pt then directed OT on setup for assisted lower body bathing and dressing.   OT suggested use of AE to bathe feet and to don pants however there was insufficient time to practice due to pt's desire to shave after washing his hair.    Pt requested assist back to bed at end of session and required setup to place pillows and bed wedge in position, as needed.  Therapy Documentation Precautions:  Precautions Precautions: Fall Restrictions Weight Bearing Restrictions: Yes RLE Weight Bearing: Non weight bearing LLE Weight Bearing: Weight bearing as tolerated Other Position/Activity Restrictions: LLE WBAT for transfers only   Pain: Pain Assessment Pain Score: 3    ADL: ADL ADL Comments: see Functional Assessment   See  Function Navigator for Current Functional Status.   Therapy/Group: Individual Therapy  [place] 02/09/2016, 12:31 PM

## 2016-02-10 ENCOUNTER — Inpatient Hospital Stay (HOSPITAL_COMMUNITY): Payer: Medicare Other | Admitting: Physical Therapy

## 2016-02-10 ENCOUNTER — Inpatient Hospital Stay (HOSPITAL_COMMUNITY): Payer: Medicare Other | Admitting: Certified Registered Nurse Anesthetist

## 2016-02-10 ENCOUNTER — Inpatient Hospital Stay (HOSPITAL_COMMUNITY): Payer: Medicare Other | Admitting: Occupational Therapy

## 2016-02-10 ENCOUNTER — Encounter (HOSPITAL_COMMUNITY)
Admission: RE | Disposition: A | Discharge status: Home Health | Payer: Self-pay | Source: Intra-hospital | Attending: Physical Medicine & Rehabilitation

## 2016-02-10 ENCOUNTER — Encounter (HOSPITAL_COMMUNITY): Payer: Self-pay | Admitting: Certified Registered Nurse Anesthetist

## 2016-02-10 ENCOUNTER — Inpatient Hospital Stay (HOSPITAL_COMMUNITY): Admission: RE | Admit: 2016-02-10 | Payer: Medicare Other | Source: Ambulatory Visit | Admitting: Plastic Surgery

## 2016-02-10 ENCOUNTER — Encounter (HOSPITAL_COMMUNITY): Payer: Self-pay | Admitting: Plastic Surgery

## 2016-02-10 HISTORY — PX: APPLICATION OF A-CELL OF BACK: SHX6301

## 2016-02-10 HISTORY — PX: INCISION AND DRAINAGE OF WOUND: SHX1803

## 2016-02-10 HISTORY — PX: APPLICATION OF WOUND VAC: SHX5189

## 2016-02-10 LAB — POCT I-STAT 4, (NA,K, GLUC, HGB,HCT)
Glucose, Bld: 171 mg/dL — ABNORMAL HIGH (ref 65–99)
HCT: 34 % — ABNORMAL LOW (ref 39.0–52.0)
Hemoglobin: 11.6 g/dL — ABNORMAL LOW (ref 13.0–17.0)
Potassium: 4.4 mmol/L (ref 3.5–5.1)
Sodium: 140 mmol/L (ref 135–145)

## 2016-02-10 LAB — GLUCOSE, CAPILLARY
GLUCOSE-CAPILLARY: 149 mg/dL — AB (ref 65–99)
Glucose-Capillary: 105 mg/dL — ABNORMAL HIGH (ref 65–99)
Glucose-Capillary: 163 mg/dL — ABNORMAL HIGH (ref 65–99)
Glucose-Capillary: 101 mg/dL — ABNORMAL HIGH (ref 65–99)

## 2016-02-10 SURGERY — IRRIGATION AND DEBRIDEMENT WOUND
Anesthesia: General | Site: Back

## 2016-02-10 MED ORDER — PANTOPRAZOLE SODIUM 40 MG PO TBEC
40.0000 mg | DELAYED_RELEASE_TABLET | Freq: Two times a day (BID) | ORAL | Status: DC
  Administered 2016-02-10 – 2016-02-12 (×4): 40 mg via ORAL
  Filled 2016-02-10 (×4): qty 1

## 2016-02-10 MED ORDER — TAB-A-VITE/IRON PO TABS
1.0000 | ORAL_TABLET | Freq: Every day | ORAL | Status: DC
  Administered 2016-02-10 – 2016-02-12 (×3): 1 via ORAL
  Filled 2016-02-10 (×3): qty 1

## 2016-02-10 MED ORDER — LIDOCAINE 2% (20 MG/ML) 5 ML SYRINGE
INTRAMUSCULAR | Status: DC | PRN
  Administered 2016-02-10: 1 mg via INTRAVENOUS

## 2016-02-10 MED ORDER — PROPOFOL 10 MG/ML IV BOLUS
INTRAVENOUS | Status: DC | PRN
  Administered 2016-02-10: 100 mg via INTRAVENOUS

## 2016-02-10 MED ORDER — SUCCINYLCHOLINE CHLORIDE 20 MG/ML IJ SOLN
INTRAMUSCULAR | Status: DC | PRN
  Administered 2016-02-10: 140 mg via INTRAVENOUS

## 2016-02-10 MED ORDER — EPHEDRINE 5 MG/ML INJ
INTRAVENOUS | Status: AC
  Filled 2016-02-10: qty 10

## 2016-02-10 MED ORDER — NEOSTIGMINE METHYLSULFATE 5 MG/5ML IV SOSY
PREFILLED_SYRINGE | INTRAVENOUS | Status: AC
  Filled 2016-02-10: qty 5

## 2016-02-10 MED ORDER — FENTANYL CITRATE (PF) 100 MCG/2ML IJ SOLN
INTRAMUSCULAR | Status: DC | PRN
  Administered 2016-02-10 (×2): 50 ug via INTRAVENOUS
  Administered 2016-02-10: 100 ug via INTRAVENOUS

## 2016-02-10 MED ORDER — ROCURONIUM BROMIDE 10 MG/ML (PF) SYRINGE
PREFILLED_SYRINGE | INTRAVENOUS | Status: AC
  Filled 2016-02-10: qty 10

## 2016-02-10 MED ORDER — SUGAMMADEX SODIUM 200 MG/2ML IV SOLN
INTRAVENOUS | Status: DC | PRN
  Administered 2016-02-10: 200 mg via INTRAVENOUS

## 2016-02-10 MED ORDER — 0.9 % SODIUM CHLORIDE (POUR BTL) OPTIME
TOPICAL | Status: DC | PRN
  Administered 2016-02-10: 1000 mL

## 2016-02-10 MED ORDER — SUGAMMADEX SODIUM 200 MG/2ML IV SOLN
INTRAVENOUS | Status: AC
  Filled 2016-02-10: qty 2

## 2016-02-10 MED ORDER — PHENYLEPHRINE 40 MCG/ML (10ML) SYRINGE FOR IV PUSH (FOR BLOOD PRESSURE SUPPORT)
PREFILLED_SYRINGE | INTRAVENOUS | Status: AC
  Filled 2016-02-10: qty 10

## 2016-02-10 MED ORDER — ONDANSETRON HCL 4 MG/2ML IJ SOLN
INTRAMUSCULAR | Status: DC | PRN
  Administered 2016-02-10: 4 mg via INTRAVENOUS

## 2016-02-10 MED ORDER — ONDANSETRON HCL 4 MG/2ML IJ SOLN: 4.0000 mg | Freq: Once | INTRAMUSCULAR | Status: DC | PRN

## 2016-02-10 MED ORDER — SUCCINYLCHOLINE CHLORIDE 200 MG/10ML IV SOSY
PREFILLED_SYRINGE | INTRAVENOUS | Status: AC
  Filled 2016-02-10: qty 10

## 2016-02-10 MED ORDER — FENTANYL CITRATE (PF) 100 MCG/2ML IJ SOLN
INTRAMUSCULAR | Status: AC
  Filled 2016-02-10: qty 2

## 2016-02-10 MED ORDER — INSULIN ASPART 100 UNIT/ML ~~LOC~~ SOLN
0.0000 [IU] | Freq: Three times a day (TID) | SUBCUTANEOUS | Status: DC
  Administered 2016-02-10: 4 [IU] via SUBCUTANEOUS
  Administered 2016-02-11: 1 [IU] via SUBCUTANEOUS
  Administered 2016-02-11: 4 [IU] via SUBCUTANEOUS

## 2016-02-10 MED ORDER — HYDROMORPHONE HCL 1 MG/ML IJ SOLN: 0.2500 mg | INTRAMUSCULAR | Status: DC | PRN

## 2016-02-10 MED ORDER — MEPERIDINE HCL 25 MG/ML IJ SOLN: 6.2500 mg | INTRAMUSCULAR | Status: DC | PRN

## 2016-02-10 MED ORDER — VITAMIN C 500 MG PO TABS
500.0000 mg | ORAL_TABLET | Freq: Two times a day (BID) | ORAL | Status: DC
  Administered 2016-02-10 – 2016-02-12 (×4): 500 mg via ORAL
  Filled 2016-02-10 (×4): qty 1

## 2016-02-10 MED ORDER — LIDOCAINE-EPINEPHRINE 1 %-1:100000 IJ SOLN
INTRAMUSCULAR | Status: AC
  Filled 2016-02-10: qty 1

## 2016-02-10 MED ORDER — ZINC SULFATE 220 (50 ZN) MG PO CAPS
220.0000 mg | ORAL_CAPSULE | Freq: Every day | ORAL | Status: DC
  Administered 2016-02-10 – 2016-02-12 (×3): 220 mg via ORAL
  Filled 2016-02-10 (×3): qty 1

## 2016-02-10 MED ORDER — LIDOCAINE 2% (20 MG/ML) 5 ML SYRINGE
INTRAMUSCULAR | Status: AC
  Filled 2016-02-10: qty 5

## 2016-02-10 MED ORDER — LIDOCAINE-EPINEPHRINE 1 %-1:100000 IJ SOLN: INTRAMUSCULAR | Status: DC | PRN

## 2016-02-10 MED ORDER — SODIUM CHLORIDE 0.9 % IR SOLN
Status: DC | PRN
  Administered 2016-02-10: 500 mL

## 2016-02-10 MED ORDER — CEFAZOLIN SODIUM 1 G IJ SOLR
INTRAMUSCULAR | Status: AC
  Filled 2016-02-10: qty 20

## 2016-02-10 MED ORDER — PROPOFOL 10 MG/ML IV BOLUS
INTRAVENOUS | Status: AC
  Filled 2016-02-10: qty 20

## 2016-02-10 MED ORDER — GLYCOPYRROLATE 0.2 MG/ML IV SOSY
PREFILLED_SYRINGE | INTRAVENOUS | Status: AC
  Filled 2016-02-10: qty 3

## 2016-02-10 MED ORDER — ONDANSETRON HCL 4 MG/2ML IJ SOLN
INTRAMUSCULAR | Status: AC
  Filled 2016-02-10: qty 2

## 2016-02-10 MED ORDER — LACTATED RINGERS IV SOLN
INTRAVENOUS | Status: DC
  Administered 2016-02-10 (×2): via INTRAVENOUS

## 2016-02-10 MED ORDER — ROCURONIUM BROMIDE 10 MG/ML (PF) SYRINGE
PREFILLED_SYRINGE | INTRAVENOUS | Status: DC | PRN
  Administered 2016-02-10: 30 mg via INTRAVENOUS

## 2016-02-10 SURGICAL SUPPLY — 46 items
APPLICATOR COTTON TIP 6IN STRL (MISCELLANEOUS) IMPLANT
BAG DECANTER FOR FLEXI CONT (MISCELLANEOUS) ×2 IMPLANT
BENZOIN TINCTURE PRP APPL 2/3 (GAUZE/BANDAGES/DRESSINGS) ×2 IMPLANT
CANISTER SUCTION 2500CC (MISCELLANEOUS) ×2 IMPLANT
CANISTER WOUND CARE 500ML ATS (WOUND CARE) ×2 IMPLANT
CONT SPEC 4OZ CLIKSEAL STRL BL (MISCELLANEOUS) ×2 IMPLANT
CONT SPEC STER OR (MISCELLANEOUS) IMPLANT
COVER SURGICAL LIGHT HANDLE (MISCELLANEOUS) ×4 IMPLANT
DRAPE IMP U-DRAPE 54X76 (DRAPES) ×2 IMPLANT
DRAPE INCISE IOBAN 66X45 STRL (DRAPES) ×2 IMPLANT
DRAPE LAPAROSCOPIC ABDOMINAL (DRAPES) IMPLANT
DRAPE LAPAROTOMY 100X72 PEDS (DRAPES) ×2 IMPLANT
DRAPE PROXIMA HALF (DRAPES) IMPLANT
DRESSING HYDROCOLLOID 4X4 (GAUZE/BANDAGES/DRESSINGS) ×2 IMPLANT
DRSG ADAPTIC 3X8 NADH LF (GAUZE/BANDAGES/DRESSINGS) IMPLANT
DRSG CUTIMED SORBACT 7X9 (GAUZE/BANDAGES/DRESSINGS) ×2 IMPLANT
DRSG PAD ABDOMINAL 8X10 ST (GAUZE/BANDAGES/DRESSINGS) IMPLANT
DRSG VAC ATS LRG SENSATRAC (GAUZE/BANDAGES/DRESSINGS) IMPLANT
DRSG VAC ATS MED SENSATRAC (GAUZE/BANDAGES/DRESSINGS) IMPLANT
DRSG VAC ATS SM SENSATRAC (GAUZE/BANDAGES/DRESSINGS) ×2 IMPLANT
ELECT CAUTERY BLADE 6.4 (BLADE) IMPLANT
ELECT REM PT RETURN 9FT ADLT (ELECTROSURGICAL) ×2
ELECTRODE REM PT RTRN 9FT ADLT (ELECTROSURGICAL) ×1 IMPLANT
GAUZE SPONGE 4X4 12PLY STRL (GAUZE/BANDAGES/DRESSINGS) IMPLANT
GLOVE BIO SURGEON STRL SZ 6.5 (GLOVE) ×4 IMPLANT
GOWN STRL REUS W/ TWL LRG LVL3 (GOWN DISPOSABLE) ×3 IMPLANT
GOWN STRL REUS W/TWL LRG LVL3 (GOWN DISPOSABLE) ×3
KIT BASIN OR (CUSTOM PROCEDURE TRAY) ×2 IMPLANT
KIT ROOM TURNOVER OR (KITS) ×2 IMPLANT
MATRIX SURGICAL PSM 7X10CM (Tissue) ×2 IMPLANT
MICROMATRIX 1000MG (Tissue) ×2 IMPLANT
NEEDLE HYPO 25GX1X1/2 BEV (NEEDLE) ×2 IMPLANT
NS IRRIG 1000ML POUR BTL (IV SOLUTION) ×2 IMPLANT
PACK GENERAL/GYN (CUSTOM PROCEDURE TRAY) ×2 IMPLANT
PACK UNIVERSAL I (CUSTOM PROCEDURE TRAY) ×2 IMPLANT
PAD ARMBOARD 7.5X6 YLW CONV (MISCELLANEOUS) ×4 IMPLANT
SOLUTION PARTIC MCRMTRX 1000MG (Tissue) ×1 IMPLANT
STAPLER VISISTAT 35W (STAPLE) ×2 IMPLANT
SURGILUBE 2OZ TUBE FLIPTOP (MISCELLANEOUS) ×2 IMPLANT
SUT MNCRL AB 4-0 PS2 18 (SUTURE) IMPLANT
SUT VIC AB 5-0 PS2 18 (SUTURE) ×2 IMPLANT
SWAB COLLECTION DEVICE MRSA (MISCELLANEOUS) IMPLANT
SYR CONTROL 10ML LL (SYRINGE) ×2 IMPLANT
TOWEL OR 17X26 10 PK STRL BLUE (TOWEL DISPOSABLE) ×2 IMPLANT
TUBE ANAEROBIC SPECIMEN COL (MISCELLANEOUS) IMPLANT
UNDERPAD 30X30 (UNDERPADS AND DIAPERS) ×2 IMPLANT

## 2016-02-10 NOTE — Progress Notes (Signed)
Physical Therapy Session Note  Patient Details  Name: Christian [name] MRN: [id number] Date of Birth: Jan 31, 1941  Today's Date: 02/10/2016 PT Individual Time: 0900-0955 PT Individual Time Calculation (min): 55 min    Short Term Goals: Week 4:  PT Short Term Goal 1 (Week 4): =LTG due to estimated LOS  Skilled Therapeutic Interventions/Progress Updates:   Pt received supine in bed, c/o 4/10 pain in buttocks/R hip and agreeable to treatment. Supine>sit with S and increased time. Stand pivot transfer w/c <>bed with RW and close S. W/c propulsion x175' with BUE and S. Second trial w/c propulsion x300' including up/down ramp with S. Stand pivot transfer w/c <>mat table with RW and S. Sit <>stand 3x5 reps with BUE support, S, and min cues to maintain RLE NWB. Returned to bed as above with stand pivot. Sit >supine modA due to pt request for BLE management. Remained supine at end of session, all needs in reach and handoff to staff for transport to surgery.   Therapy Documentation Precautions:  Precautions Precautions: Fall Restrictions Weight Bearing Restrictions: Yes RLE Weight Bearing: Non weight bearing LLE Weight Bearing: Weight bearing as tolerated Other Position/Activity Restrictions: LLE WBAT for transfers only   See Function Navigator for Current Functional Status.   Therapy/Group: Individual Therapy  [name] 02/10/2016, 9:56 AM

## 2016-02-10 NOTE — Progress Notes (Signed)
[place] PHYSICAL MEDICINE & REHABILITATION     PROGRESS NOTE    Subjective/Complaints: Uneventful night    ROS: Pt denies fever, rash/itching, headache, blurred or double vision, nausea, vomiting, abdominal pain, diarrhea, chest pain, shortness of breath, palpitations, dysuria, dizziness, neck   bleeding, anxiety, or depression   Objective: Vital Signs: Blood pressure (!) 146/52, pulse 98, temperature 98.9 F (37.2 C), temperature source Oral, resp. rate 18, height 6\' 2"  (1.88 m), weight 93.9 kg (207 lb), SpO2 98 %. No results found. No results for input(s): WBC, HGB, HCT, PLT in the last 72 hours. No results for input(s): NA, K, CL, GLUCOSE, BUN, CREATININE, CALCIUM in the last 72 hours.  Invalid input(s): CO CBG (last 3)   Recent Labs  02/09/16 1655 02/09/16 2115 02/10/16 0625  GLUCAP 142* 141* 105*    Wt Readings from Last 3 Encounters:  02/06/16 93.9 kg (207 lb)  01/10/16 100.7 kg (222 lb)    Physical Exam:  Constitutional: He appears well-developed. NAD. HENT: Normocephalicand atraumatic.  Eyes: EOMare normal. No discharge.  Cardiovascular: Normal rate, regular rhythmand normal heart sounds. intact pulses Respiratory: Effort normaland breath sounds normal. No respiratory distress. He has no wheezes. He has no rales.  GI: Soft. Bowel sounds are normal. He exhibits no distension. There is no tenderness.  Genitourinary: +SPC in place Musculoskeletal: He exhibits edema right greater than left lower ext  Neurological: He is alertand oriented.  UE 5/5 prox to distal.  RLE: 2/5 HF, KE 3/5 ADF/PF.  LLE: 3/5 HF, 3/5 KE and 4/5 ADF/PF.   Skin: Skin is warm. Sacral wound dressed with mild odor. Suprapubic catheter site clean   Psychiatric: He has a normal mood and affect.    Assessment/Plan: 1. Functional and mobility deficits secondary to polytrauma which require 3+ hours per day of interdisciplinary therapy in a comprehensive inpatient rehab  setting. Physiatrist is providing close team supervision and 24 hour management of active medical problems listed below. Physiatrist and rehab team continue to assess barriers to discharge/monitor patient progress toward functional and medical goals.  Function:  Bathing Bathing position   Position: Wheelchair/chair at sink  Bathing parts Body parts bathed by patient: Left arm, Right arm, Chest, Abdomen, Right upper leg, Left upper leg, Front perineal area Body parts bathed by helper: Buttocks, Right lower leg, Left lower leg, Back  Bathing assist Assist Level: Touching or steadying assistance(Pt > 75%)      Upper Body Dressing/Undressing Upper body dressing   What is the patient wearing?: Pull over shirt/dress     Pull over shirt/dress - Perfomed by patient: Thread/unthread right sleeve, Thread/unthread left sleeve, Put head through opening, Pull shirt over trunk Pull over shirt/dress - Perfomed by helper: Pull shirt over trunk        Upper body assist Assist Level: Set up   Set up : To obtain clothing/put away  Lower Body Dressing/Undressing Lower body dressing   What is the patient wearing?: Pants, Non-skid slipper socks   Underwear - Performed by helper: Thread/unthread left underwear leg, Pull underwear up/down, Thread/unthread right underwear leg Pants- Performed by patient: Thread/unthread right pants leg, Thread/unthread left pants leg, Pull pants up/down Pants- Performed by helper: Thread/unthread right pants leg, Thread/unthread left pants leg, Pull pants up/down, Fasten/unfasten pants   Non-skid slipper socks- Performed by helper: Don/doff right sock, Don/doff left sock       Shoes - Performed by helper: Don/doff right shoe, Don/doff left shoe       TED  Hose - Performed by helper: Don/doff right TED hose, Don/doff left TED hose  Lower body assist Assist for lower body dressing:  (Total assist)   Set up : To obtain clothing/put away  Toileting Toileting  Toileting activity did not occur: No continent bowel/bladder event   Toileting steps completed by helper: Adjust clothing prior to toileting, Performs perineal hygiene, Adjust clothing after toileting Toileting Assistive Devices: Grab bar or rail  Toileting assist Assist level:  (Total A)   Transfers Chair/bed transfer Chair/bed transfer activity did not occur: Safety/medical concerns Chair/bed transfer method: Stand pivot Chair/bed transfer assist level: Supervision or verbal cues Chair/bed transfer assistive device: Walker, Bedrails, Armrests     Locomotion Ambulation Ambulation activity did not occur: Safety/medical concerns         Wheelchair Wheelchair activity did not occur: Safety/medical concerns Type: Manual Max wheelchair distance: 300 Assist Level: Supervision or verbal cues  Cognition Comprehension Comprehension assist level: Follows complex conversation/direction with no assist  Expression Expression assist level: Expresses complex ideas: With extra time/assistive device  Social Interaction Social Interaction assist level: Interacts appropriately with others - No medications needed.  Problem Solving Problem solving assist level: Solves complex 90% of the time/cues < 10% of the time  Memory Memory assist level: More than reasonable amount of time   Medical Problem List and Plan: 1.  Multi pelvic fractures status post sacroiliac screw fixation secondary to trauma.   -CIR PT/OT continue--better tolerance             -Touchdown weightbearing right leg to facilitate transfers.              -Weightbearing as tolerated left leg for transfers only 8 weeks,  2.  DVT Prophylaxis/Anticoagulation: SQ Lovenox.  -vascular study normal 3. Pain Management: Neurontin 300 mg TID  -continue fentanyl patch at 40mcg   -oxycodone prn/ with two scheduled doses to coincide with therapy  -much better control 4. Acute blood loss anemia.  FeSo4 324 mg bid.    CBC Latest Ref Rng & Units  01/30/2016 01/20/2016 01/18/2016  WBC 4.0 - 10.5 K/uL 7.8 10.1 10.9(H)  Hemoglobin 13.0 - 17.0 g/dL 9.2(L) 7.7(L) 7.5(L)  Hematocrit 39.0 - 52.0 % 30.5(L) 24.8(L) 24.0(L)  Platelets 150 - 400 K/uL 400 256 246   5. Neuropsych: This patient is capable of making decisions on his own behalf. 6. Skin/Wound Care/Sacral wound: air mattress.  -s/p I&D, placement of Acell 10/19 by Dr. Marla Roe.  -for further I &D today---will need to go home with a vac   -pt to be off wound unless he's on air mattress 7. Fluids/Electrolytes/Nutrition: encourage PO 8. Urethral trauma. Status post suprapubic catheter. Follow-up with changes as directed. Urine remains clear/yellow   9. Diabetes mellitus with peripheral neuropathy. Hemoglobin A1c 7.9.  Levemir 15 units daily at bedtime  Glucophage 1000 mg twice a day  Glucotrol 10 mg before breakfast and 5 mg before supper  -reasonable control at present   CBG (last 3)   Recent Labs  02/09/16 1655 02/09/16 2115 02/10/16 0625  GLUCAP 142* 141* 105*   -treat any spikes with SSI  10. Hypertension. Norvasc increased to 10mg  on 10/22.  Vitals:   02/09/16 1342 02/10/16 0543  BP: (!) 142/71 (!) 146/52  Pulse: 96 98  Resp: 18 18  Temp: 98 F (36.7 C) 98.9 F (37.2 C)   11. Hyperlipidemia. Lipitor 12. Constipation    -working on more effective prophylactic program  -senokot s adjusted LOS (Days) 24 A FACE  TO FACE EVALUATION WAS PERFORMED  Sonjia Wilcoxson T 02/10/2016 8:53 AM

## 2016-02-10 NOTE — Anesthesia Preprocedure Evaluation (Signed)
Anesthesia Evaluation   Patient awake    Reviewed: Allergy & Precautions, NPO status , Patient's Chart, lab work & pertinent test results  Airway Mallampati: I  TM Distance: >3 FB Neck ROM: Full    Dental   Pulmonary    Pulmonary exam normal        Cardiovascular hypertension, Pt. on medications Normal cardiovascular exam     Neuro/Psych    GI/Hepatic   Endo/Other  diabetes  Renal/GU      Musculoskeletal   Abdominal   Peds  Hematology   Anesthesia Other Findings   Reproductive/Obstetrics                             Anesthesia Physical Anesthesia Plan  ASA: III  Anesthesia Plan: General   Post-op Pain Management:    Induction: Intravenous  Airway Management Planned: LMA  Additional Equipment:   Intra-op Plan:   Post-operative Plan: Extubation in OR  Informed Consent: I have reviewed the patients History and Physical, chart, labs and discussed the procedure including the risks, benefits and alternatives for the proposed anesthesia with the patient or authorized representative who has indicated his/her understanding and acceptance.     Plan Discussed with: CRNA and Surgeon  Anesthesia Plan Comments:         Anesthesia Quick Evaluation

## 2016-02-10 NOTE — Anesthesia Postprocedure Evaluation (Signed)
Anesthesia Post Note  Patient: Christian [name]  Procedure(s) Performed: Procedure(s) (LRB): IRRIGATION AND DEBRIDEMENT sacral WOUND (N/A) APPLICATION OF A-CELL OF sacral wound (N/A) APPLICATION OF WOUND VAC possible (N/A)  Patient location during evaluation: PACU Anesthesia Type: General Level of consciousness: awake and alert Pain management: pain level controlled Vital Signs Assessment: post-procedure vital signs reviewed and stable Respiratory status: spontaneous breathing, nonlabored ventilation, respiratory function stable and patient connected to nasal cannula oxygen Cardiovascular status: blood pressure returned to baseline and stable Postop Assessment: no signs of nausea or vomiting Anesthetic complications: no    Last Vitals:  Vitals:   02/10/16 1315 02/10/16 1422  BP:  139/60  Pulse: 85 80  Resp: 10 15  Temp: 36.7 C 37.1 C    Last Pain:  Vitals:   02/10/16 1422  TempSrc: Oral  PainSc:                  Maury Groninger DAVID

## 2016-02-10 NOTE — Op Note (Signed)
DATE OF OPERATION: 02/10/2016  LOCATION: [facility] Inpatient  PREOPERATIVE DIAGNOSIS: sacral ulcer stage III  POSTOPERATIVE DIAGNOSIS: Same  PROCEDURE:  1. Sharpe excisional debridement of sacral ulcer 6 x 10 x 3 cm 2. Placement of Acell (powder 1 gm and sheet 7 x 10 cm) 3. Placement of VAC  SURGEON: Nita Whitmire [employer], DO  ASSISTANT: Shawn Rayburn, PA  EBL: minimal  CONDITION: Stable  COMPLICATIONS: None  INDICATION: The patient, Christian [name], is a 75 y.o. male born on 07-09-1940, is here for treatment a traumatic sacral ulcer that was through skin, soft tissue and muscle.   PROCEDURE DETAILS:  The patient was seen on the morning of her surgery and marked out for her flap.  The IV antibiotics were given. The patient was taken to the operating room and given a general anesthetic. A standard time out was performed and all information was confirmed by those in the room. SCDs were placed.   The area was irrigated with antibiotic solution.  The #10 blade was used to debride the 6 x 10 cm area of skin, soft tissue and 2 x 2 cm muscle.  Hemostasis was achieved with electrocautery.  All of the Acell powder and sheet was applied to the area and secured with 5-0 Vicryl.  The sorbact was secured and a VAC was applied with KY gel.  There was an excellent seal.  The patient was allowed to wake up and taken to recovery room in stable condition at the end of the case. The family was notified at the end of the case.

## 2016-02-10 NOTE — Progress Notes (Signed)
Occupational Therapy Session Note  Patient Details  Name: Christian [name] MRN: [id number] Date of Birth: Aug 18, 1940  Today's Date: 02/10/2016 OT Individual Time: 0730-0830 OT Individual Time Calculation (min): 60 min     Short Term Goals:Week 4:  OT Short Term Goal 1 (Week 4): LTG=STG due to upcoming d/c  Skilled Therapeutic Interventions/Progress Updates:    Pt seen for OT session focusing on functional transfers and education with pt and pt's wife. Pt in supine upon arrival, voicing increased tightness in R LE and fatigue due to NPO status in prep for surgery today. With extra encouragement from wife, pt willing to participate in therapy. Bed mobility completed with supervision using hospital bed functions. Min A stand pivot to w/c with VCs for safety awareness. Pt and pt's wife with questions regarding showering equipment for d/c. At this time pt not medically cleared for shower nor is it appropriate at this time. Educated both of them on this as well as role of HHOT and his/her ability to assess this once pt home and more medically stable (i.e. Without wound vac and severity of wound at this time).  Pt and wife taken to ADL apartment and shower tub transfer bench and shower chair and demonstrated entering/ exiting methods for both as well as pros/cons of each method. Upon return to room, pt completed grooming tasks mod I at sink.  He returned to supine with min A to manage R LE. PT voiced increased stiffness in R LE, stating "it feels like a ton of bricks today". AAROM/ stretching performed to B LEs. Educated regarding methods and importance of ROM/strengthening of LEs in prep for walking in the future as well as progression of NSB status (NWB, TTWB, PWB, etc. ) Pt left in supine at end of session, wife present.  Pt described to therapist how this weekend he ambulated throughout room and into bathroom. Reviewed with pt and pt's wife regarding strict WB status as L LE WBAT for transfers only.  Educated regarding reason for WB pre-cautions and possible risk for damage/ injury with repeated pressure on L LE, both voiced understanding.   Therapy Documentation Precautions:  Precautions Precautions: Fall Restrictions Weight Bearing Restrictions: Yes RLE Weight Bearing: Non weight bearing LLE Weight Bearing: Weight bearing as tolerated Other Position/Activity Restrictions: LLE WBAT for transfers only ADL: ADL ADL Comments: see Functional Assessment  See Function Navigator for Current Functional Status.   Therapy/Group: Individual Therapy  [name], Anica Alcaraz C 02/10/2016, 7:08 AM

## 2016-02-10 NOTE — Brief Op Note (Signed)
01/17/2016 - 02/10/2016  11:09 AM  PATIENT:  Christian [name]  75 y.o. male  PRE-OPERATIVE DIAGNOSIS:  sacral wound  POST-OPERATIVE DIAGNOSIS:  sacral wound  PROCEDURE:  Procedure(s): IRRIGATION AND DEBRIDEMENT sacral WOUND (N/A) APPLICATION OF A-CELL OF sacral wound (N/A) APPLICATION OF WOUND VAC possible (N/A)  SURGEON:  Surgeon(s) and Role:    * Loel Lofty Dillingham, DO - Primary  PHYSICIAN ASSISTANT: Shawn Rayburn, PA  ASSISTANTS: none   ANESTHESIA:   general  EBL:  No intake/output data recorded.  BLOOD ADMINISTERED:none  DRAINS: none   LOCAL MEDICATIONS USED:  NONE  SPECIMEN:  No Specimen  DISPOSITION OF SPECIMEN:  N/A  COUNTS:  YES  TOURNIQUET:  * No tourniquets in log *  DICTATION: .Dragon Dictation  PLAN OF CARE: admit back to rehab  PATIENT DISPOSITION:  PACU - hemodynamically stable.   Delay start of Pharmacological VTE agent (>24hrs) due to surgical blood loss or risk of bleeding: no

## 2016-02-10 NOTE — Transfer of Care (Signed)
Immediate Anesthesia Transfer of Care Note  Patient: Christian [name]  Procedure(s) Performed: Procedure(s): IRRIGATION AND DEBRIDEMENT sacral WOUND (N/A) APPLICATION OF A-CELL OF sacral wound (N/A) APPLICATION OF WOUND VAC possible (N/A)  Patient Location: PACU  Anesthesia Type:General  Level of Consciousness: awake, alert  and oriented  Airway & Oxygen Therapy: Patient Spontanous Breathing and Patient connected to face mask oxygen  Post-op Assessment: Report given to RN and Post -op Vital signs reviewed and stable  Post vital signs: Reviewed and stable  Last Vitals:  Vitals:   02/09/16 1342 02/10/16 0543  BP: (!) 142/71 (!) 146/52  Pulse: 96 98  Resp: 18 18  Temp: 36.7 C 37.2 C    Last Pain:  Vitals:   02/10/16 0543  TempSrc: Oral  PainSc:       Patients Stated Pain Goal: 2 ([id number])  Complications: No apparent anesthesia complications

## 2016-02-10 NOTE — Interval H&P Note (Signed)
History and Physical Interval Note:  02/10/2016 11:09 AM  Christian [name]  has presented today for surgery, with the diagnosis of sacral wound  The various methods of treatment have been discussed with the patient and family. After consideration of risks, benefits and other options for treatment, the patient has consented to  Procedure(s): IRRIGATION AND DEBRIDEMENT sacral WOUND (N/A) APPLICATION OF A-CELL OF sacral wound (N/A) APPLICATION OF WOUND VAC possible (N/A) as a surgical intervention .  The patient's history has been reviewed, patient examined, no change in status, stable for surgery.  I have reviewed the patient's chart and labs.  Questions were answered to the patient's satisfaction.     Wallace Going

## 2016-02-10 NOTE — Anesthesia Procedure Notes (Signed)
Procedure Name: Intubation Date/Time: 02/10/2016 11:20 AM Performed by: Neldon Newport Pre-anesthesia Checklist: Timeout performed, Patient being monitored, Suction available, Emergency Drugs available and Patient identified Patient Re-evaluated:Patient Re-evaluated prior to inductionOxygen Delivery Method: Circle system utilized Preoxygenation: Pre-oxygenation with 100% oxygen Intubation Type: IV induction and Rapid sequence Ventilation: Mask ventilation without difficulty Laryngoscope Size: Glidescope and 3 Grade View: Grade I Tube type: Oral Tube size: 7.5 mm Number of attempts: 1 Secured at: 23 cm Tube secured with: Tape Dental Injury: Teeth and Oropharynx as per pre-operative assessment  Comments: Intubation performed by Delphia Grates, CRNA

## 2016-02-10 NOTE — Plan of Care (Signed)
Problem: RH PAIN MANAGEMENT Goal: RH STG PAIN MANAGED AT OR BELOW PT'S PAIN GOAL 4 or less  Outcome: Not Progressing Reports pain as 8 due to recent procedure

## 2016-02-11 ENCOUNTER — Inpatient Hospital Stay (HOSPITAL_COMMUNITY): Payer: Medicare Other

## 2016-02-11 ENCOUNTER — Inpatient Hospital Stay (HOSPITAL_COMMUNITY): Payer: Medicare Other | Admitting: Occupational Therapy

## 2016-02-11 ENCOUNTER — Encounter (HOSPITAL_COMMUNITY): Payer: Self-pay | Admitting: Plastic Surgery

## 2016-02-11 ENCOUNTER — Inpatient Hospital Stay (HOSPITAL_COMMUNITY): Payer: Medicare Other | Admitting: Physical Therapy

## 2016-02-11 HISTORY — PX: IR GENERIC HISTORICAL: IMG1180011

## 2016-02-11 LAB — GLUCOSE, CAPILLARY
GLUCOSE-CAPILLARY: 118 mg/dL — AB (ref 65–99)
GLUCOSE-CAPILLARY: 156 mg/dL — AB (ref 65–99)
GLUCOSE-CAPILLARY: 121 mg/dL — AB (ref 65–99)
Glucose-Capillary: 166 mg/dL — ABNORMAL HIGH (ref 65–99)

## 2016-02-11 MED ORDER — FENTANYL 25 MCG/HR TD PT72: 50.0000 ug | MEDICATED_PATCH | TRANSDERMAL | Status: DC

## 2016-02-11 MED ORDER — IOPAMIDOL (ISOVUE-300) INJECTION 61%
INTRAVENOUS | Status: AC
  Administered 2016-02-11: 10 mL
  Filled 2016-02-11: qty 50

## 2016-02-11 MED ORDER — FENTANYL 25 MCG/HR TD PT72
50.0000 ug | MEDICATED_PATCH | TRANSDERMAL | Status: DC
  Administered 2016-02-11: 50 ug via TRANSDERMAL
  Filled 2016-02-11: qty 2

## 2016-02-11 MED ORDER — LIDOCAINE HCL 1 % IJ SOLN
INTRAMUSCULAR | Status: DC | PRN
Start: 2016-02-11 — End: 2016-02-12
  Administered 2016-02-11: 10 mL

## 2016-02-11 MED ORDER — LIDOCAINE HCL 1 % IJ SOLN
INTRAMUSCULAR | Status: AC
Start: 2016-02-11 — End: 2016-02-12
  Filled 2016-02-11: qty 20

## 2016-02-11 NOTE — Progress Notes (Signed)
Occupational Therapy Session Note  Patient Details  Name: Christian [name] MRN: [id number] Date of Birth: 1940/07/13  Today's Date: 02/11/2016 OT Individual Time: 0700-0800 OT Individual Time Calculation (min): 60 min    Short Term Goals:Week 4:  OT Short Term Goal 1 (Week 4): LTG=STG due to upcoming d/c  Skilled Therapeutic Interventions/Progress Updates:    Pt seen for OT session focusing on ADL re-training. Pt asleep in supine upon arrival, easily awoken anad agreeable to tx session. Pt voiced increased pain/ discomfort from surgery yesterday, stating he had rough night sleeping as he couldn't find comfortable position.  Pt transferred to EOB with significantly increased time, close supervision stand pivot to w/c completed with RW. UB bathing and grooming tasks completed seated in w/c at sink mod I. He dressed LB with set-up assist using reacher to thread pants over LEs. He stood with intermittent steadying assist to pull pants up.   He completed stand pivot transfer w/c> BSC> EOB with close supervision. Pt returned to supine at end of session, min A to assist R LE onto bed. Left set-up with breakfast and all needs in reach.  Reviewed with pt plans for d/c home tomorrow, pt voiced feeling ready to go home and his and his wife's ability to manage pain and ADLs at home.   Therapy Documentation Precautions:  Precautions Precautions: Fall Restrictions Weight Bearing Restrictions: Yes RLE Weight Bearing: Non weight bearing LLE Weight Bearing: Weight bearing as tolerated Other Position/Activity Restrictions: LLE WBAT for transfers only Pain: Pain Assessment Pain Score: "Manageable" ADL: ADL ADL Comments: see Functional Assessment  See Function Navigator for Current Functional Status.   Therapy/Group: Individual Therapy  [name], Laveta Gilkey C 02/11/2016, 6:36 AM

## 2016-02-11 NOTE — Discharge Instructions (Signed)
Inpatient Rehab Discharge Instructions  Christian [name] Discharge date and time: No discharge date for patient encounter.   Activities/Precautions/ Functional Status: Activity: Touchdown weightbearing right lower extremities to facilitate transfers Diet: diabetic diet Wound Care: keep wound clean and dry Functional status:  ___ No restrictions     ___ Walk up steps independently ___ 24/7 supervision/assistance   ___ Walk up steps with assistance ___ Intermittent supervision/assistance  ___ Bathe/dress independently ___ Walk with walker     _x__ Bathe/dress with assistance ___ Walk Independently    ___ Shower independently ___ Walk with assistance    ___ Shower with assistance ___ No alcohol     ___ Return to work/school ________   COMMUNITY REFERRALS UPON DISCHARGE:    Home Health:   PT     OT     RN                      Agency:  [facility] Phone: [phone]   Medical Equipment/Items Ordered:  Wheelchair, cushion, hospital bed, rolling walker, commode                                                      Agency/Supplier:  [facility] @ [phone]  Other:  Wound VAC via [employer] @ [phone]      Special Instructions:  Home health nurse for wound VAC changes Mondays and Thursdays  My questions have been answered and I understand these instructions. I will adhere to these goals and the provided educational materials after my discharge from the hospital.  Patient/Caregiver Signature _______________________________ Date __________  Clinician Signature _______________________________________ Date __________  Please bring this form and your medication list with you to all your follow-up doctor's appointments.

## 2016-02-11 NOTE — Progress Notes (Signed)
Physical Therapy Session Note  Patient Details  Name: Christian [name] MRN: [id number] Date of Birth: 07-12-1940  Today's Date: 02/11/2016 PT Individual Time: 0900-1030 PT Individual Time Calculation (min): 90 min    Short Term Goals: Week 4:  PT Short Term Goal 1 (Week 4): =LTG due to estimated LOS  Skilled Therapeutic Interventions/Progress Updates:   Pt received L sidelying in bed, c/o pain as below and agreeable to treatment. Supine>sit with S and increased time using bedrails. Stand pivot transfer w/c <>bed with Rw and close S. W/c propulsion 2x200' with BUE and S. Stand pivot transfer w/c <>car with S and assist for managing catheter bag and wound vac. Discussed with pt importance of directing his car to A his wife with line management and setup of transfer. Performed UE ergometer x10 min (5 min forward, 5 min backward) on level 9 for BUE strengthening and aerobic endurance. Returned to bed as above. Instructed pt to complete HEP as he would at home; required [facility] for SLR and pt directed therapist in hand placement and level of assist. Pillow case placed under R heel for assist in reducing friction with hip abduction/adduction. Remained in L sidelying at end of session, all needs in reach.   Therapy Documentation Precautions:  Precautions Precautions: Fall Restrictions Weight Bearing Restrictions: Yes RLE Weight Bearing: Non weight bearing LLE Weight Bearing: Weight bearing as tolerated Other Position/Activity Restrictions: LLE WBAT for transfers only Pain: Pain Assessment Pain Assessment: 0-10 Pain Score: 7  Pain Type: Acute pain Pain Location: Buttocks Pain Orientation: Right;Posterior Pain Radiating Towards: hips Pain Descriptors / Indicators: Aching;Discomfort Pain Frequency: Intermittent Pain Onset: Gradual Patients Stated Pain Goal: 3 Pain Intervention(s): Medication (See eMAR);Repositioned   See Function Navigator for Current Functional Status.   Therapy/Group:  Individual Therapy  [name] 02/11/2016, 10:10 AM

## 2016-02-11 NOTE — Discharge Summary (Signed)
Discharge summary job [phone]

## 2016-02-11 NOTE — Patient Instructions (Signed)
Bathing: Promoting Independence and Safety    Use long-handled brush or sponge to soap hard-to-reach areas. Hand-held shower directs water flow for rinsing off. Sit on bench with legs out of tub to towel dry.  Copyright  VHI. All rights reserved.

## 2016-02-11 NOTE — Progress Notes (Signed)
Occupational Therapy Discharge Summary  Patient Details  Name: Christian [name] MRN: [id number] Date of Birth: Oct 29, 1940  Patient has met 9 of 9 long term goals due to improved activity tolerance, improved balance, postural control, ability to compensate for deficits and improved coordination.  Patient to discharge at [facility] Assist level.  Patient's care partner is independent to provide the necessary physical assistance at discharge. Pt has made great improvements while on CIR. He cont to be limited by pain and decreased ability to find comfortable position due to sacral wound.  With AE, pt can be supervision- min A for UB/ LB bathing and dressing, however, pt prefers to have wife assist with all bathing/dressing needs.  Have recommended toileting at [facility] for stand pivot from EOB due to urgent bowel needs/ hx of incontinence.  Pt not cleared for shower while on CIR, have deferred showering task and needed DME to [facility] once pt medically stable.   Recommendation:  Patient will benefit from ongoing skilled OT services in home health setting to continue to advance functional skills in the area of BADL and Reduce care partner burden.  Equipment: BSC; deferred showering equipment for HHOT assessment once medically appropriate  Reasons for discharge: treatment goals met and discharge from hospital  Patient/family agrees with progress made and goals achieved: Yes  OT Discharge Precautions/Restrictions  Precautions Precautions: Fall Restrictions Weight Bearing Restrictions: Yes RLE Weight Bearing: Non weight bearing LLE Weight Bearing: Weight bearing as tolerated Other Position/Activity Restrictions: LLE WBAT for transfers only Pain Pain Assessment Pain Score: Asleep ADL ADL ADL Comments: see Functional Assessment Vision/Perception  Vision- History Baseline Vision/History: Wears glasses Wears Glasses: Reading only Patient Visual Report: No change from baseline Vision-  Assessment Vision Assessment?: No apparent visual deficits  Cognition Overall Cognitive Status: Within Functional Limits for tasks assessed Arousal/Alertness: Awake/alert Orientation Level: Oriented X4 Sustained Attention: Appears intact Memory: Appears intact Awareness: Appears intact Problem Solving: Appears intact Safety/Judgment: Appears intact Sensation Sensation Light Touch: Appears Intact Proprioception: Appears Intact Additional Comments: hypersensitive to pain in BLE  Coordination Gross Motor Movements are Fluid and Coordinated: No Fine Motor Movements are Fluid and Coordinated: Yes Coordination and Movement Description: WFL at [place]; mild OA at R shoulder Heel Shin Test: impaired RLE due to pain Motor  Motor Motor: Within Functional Limits Motor - Discharge Observations: limited by pain, atrophy Trunk/Postural Assessment  Cervical Assessment Cervical Assessment: Within Functional Limits Thoracic Assessment Thoracic Assessment: Within Functional Limits Lumbar Assessment Lumbar Assessment: Within Functional Limits Postural Control Postural Control: Deficits on evaluation (Impaired due to pain)  Balance Balance Balance Assessed: Yes Dynamic Sitting Balance Dynamic Sitting - Balance Support: Feet supported;During functional activity Dynamic Sitting - Level of Assistance: 5: Stand by assistance Sitting balance - Comments: Sitting EOB to complete UB bathing/dressin Static Standing Balance Static Standing - Balance Support: Bilateral upper extremity supported Static Standing - Level of Assistance: 5: Stand by assistance Static Standing - Comment/# of Minutes: Standing at [employer] Dynamic Standing Balance Dynamic Standing - Balance Support: Bilateral upper extremity supported Dynamic Standing - Level of Assistance: 5: Stand by assistance Extremity/Trunk Assessment RUE Assessment RUE Assessment: Within Functional Limits LUE Assessment LUE Assessment: Within Functional  Limits   See Function Navigator for Current Functional Status.  [name], Yaroslav Gombos C 02/11/2016, 2:34 PM

## 2016-02-11 NOTE — Progress Notes (Signed)
Physical Therapy Discharge Summary  Patient Details  Name: Christian [name] MRN: [id number] Date of Birth: 20-Jul-1940  Today's Date: 02/12/2016 PT Individual Time: 0900-0930 PT Individual Time Calculation (min): 30 min     Patient has met 7 of 8 long term goals due to improved activity tolerance, improved balance, improved postural control, increased strength, increased range of motion, decreased pain, ability to compensate for deficits, functional use of  right upper extremity, right lower extremity, left upper extremity and left lower extremity, improved attention, improved awareness and improved coordination.  Patient to discharge at a wheelchair level [place] for sit >supine, S overall.   Patient's care partner is independent to provide the necessary physical assistance at discharge.  Reasons goals not met: Requires minA for sit >supine for RLE management.  Recommendation:  Patient will benefit from ongoing skilled PT services in home health setting to continue to advance safe functional mobility, address ongoing impairments in strength, balance, activity tolerance, and minimize fall risk.  Equipment: Hospital bed, w/c, RW  Reasons for discharge: treatment goals met and discharge from hospital  Patient/family agrees with progress made and goals achieved: Yes  PT Discharge Precautions/RestrictionsPrecautions Precautions: Fall Restrictions Weight Bearing Restrictions: Yes RLE Weight Bearing: Non weight bearing LLE Weight Bearing: Weight bearing as tolerated Other Position/Activity Restrictions: LLE WBAT for transfers only Pain Pain Assessment Pain Assessment: 0-10 Pain Score: 7  Pain Type: Acute pain Pain Location: Buttocks Pain Orientation: Right;Posterior Pain Radiating Towards: hips Pain Descriptors / Indicators: Aching;Discomfort Pain Frequency: Intermittent Pain Onset: Gradual Patients Stated Pain Goal: 3 Pain Intervention(s): Medication (See  eMAR);Repositioned Vision/Perception  Perception Comments: WFL  Cognition Overall Cognitive Status: Within Functional Limits for tasks assessed Orientation Level: Oriented to person;Oriented to place;Oriented to situation;Oriented to time Attention: Sustained Sustained Attention: Appears intact Memory: Appears intact Awareness: Appears intact Problem Solving: Appears intact Safety/Judgment: Appears intact Sensation Sensation Light Touch: Appears Intact Stereognosis: Not tested Hot/Cold: Not tested Proprioception: Appears Intact Coordination Gross Motor Movements are Fluid and Coordinated: No Fine Motor Movements are Fluid and Coordinated: Yes Coordination and Movement Description: WFL at BUE; mild OA at R shoulder Heel Shin Test: impaired RLE due to pain Motor  Motor Motor: Within Functional Limits Motor - Discharge Observations: limited by pain, atrophy  Mobility Bed Mobility Bed Mobility: Rolling Left;Supine to Sit;Sit to Supine Rolling Left: 5: Supervision Supine to Sit: 5: Supervision Supine to Sit Details: Verbal cues for technique;Verbal cues for precautions/safety Sit to Supine: 4: Min assist;With rail Sit to Supine - Details: Tactile cues for placement;Verbal cues for technique Transfers Transfers: Yes Stand Pivot Transfers: 5: Supervision Stand Pivot Transfer Details: Verbal cues for technique;Verbal cues for precautions/safety Locomotion  Ambulation Ambulation: No (N/T, not cleared for ambulation) Stairs / Additional Locomotion Stairs: No Wheelchair Mobility Wheelchair Mobility: Yes Wheelchair Assistance: 5: Investment banker, operational Details: Verbal cues for Marketing executive: Both upper extremities Wheelchair Parts Management: Needs assistance Distance: 200'  Trunk/Postural Assessment  Cervical Assessment Cervical Assessment: Within Functional Limits Thoracic Assessment Thoracic Assessment: Within Functional Limits Lumbar  Assessment Lumbar Assessment: Within Functional Limits Postural Control Postural Control: Deficits on evaluation (limited by pain)  Balance Balance Balance Assessed: Yes Static Sitting Balance Static Sitting - Balance Support: Left upper extremity supported Static Sitting - Level of Assistance: 6: Modified independent (Device/Increase time) Dynamic Sitting Balance Dynamic Sitting - Balance Support: Feet supported;Left upper extremity supported Dynamic Sitting - Level of Assistance: 5: Stand by assistance Static Standing Balance Static Standing - Balance Support: Bilateral upper  extremity supported Static Standing - Level of Assistance: 5: Stand by assistance Dynamic Standing Balance Dynamic Standing - Balance Support: Bilateral upper extremity supported Dynamic Standing - Level of Assistance: 5: Stand by assistance Dynamic Standing - Comments: during transfers Extremity Assessment  RUE Assessment RUE Assessment: Within Functional Limits LUE Assessment LUE Assessment: Within Functional Limits RLE Assessment RLE Assessment: Exceptions to [facility] RLE AROM (degrees) RLE Overall AROM Comments: limited hip flexion d/t pain RLE Strength RLE Overall Strength Comments: grossly 2/5 hip d/t pain, 4/5 to 4+/5 knee and ankle LLE Assessment LLE Assessment: Exceptions to [facility] (hip flexion 3/5, knee and ankle 4+/5 to 5/5 throughout)   Skilled Therapeutic Intervention: Pt received supine in bed, c/o pain as above and agreeable to treatment. Pt performed supine>sit on EOB with S. Educated pt and wife in w/c management, including removal/addition of parts, folding for storage and transport, and adjustments based on pt's fit in chair. Pt declines getting into chair due to incorrect cushion delivered. Sit <>stand with S to stand on scale with pt requesting being weighed. Returned to supine with minA from wife. Pt's wife verbalizes need for RW; reports it was not delivered to house. Pt remained in bed at end  of session, all needs in reach. Pt and wife with no further questions/concerns regarding d/c at this time. Alerted CSW to equipment needs; she was already aware and working on locating correct equipment.   See Function Navigator for Current Functional Status.  [name] 02/11/2016, 10:25 AM

## 2016-02-11 NOTE — Progress Notes (Signed)
PHYSICAL MEDICINE & REHABILITATION     PROGRESS NOTE    Subjective/Complaints: A little more uncomfortable after surgery yesterday. Was able to sleep. Ready for therapy today.     ROS: Pt denies fever, rash/itching, headache, blurred or double vision, nausea, vomiting, abdominal pain, diarrhea, chest pain, shortness of breath, palpitations, dysuria, dizziness, neck   bleeding, anxiety, or depression   Objective: Vital Signs: Blood pressure 135/71, pulse 82, temperature 98.2 F (36.8 C), temperature source Oral, resp. rate 16, height 6\' 2"  (1.88 m), weight 93.9 kg (207 lb), SpO2 96 %. No results found.  Recent Labs  02/10/16 1044  HGB 11.6*  HCT 34.0*    Recent Labs  02/10/16 1044  NA 140  K 4.4  GLUCOSE 171*   CBG (last 3)   Recent Labs  02/10/16 1624 02/10/16 2044 02/11/16 0652  GLUCAP 163* 101* 118*    Wt Readings from Last 3 Encounters:  02/06/16 93.9 kg (207 lb)  01/10/16 100.7 kg (222 lb)    Physical Exam:  Constitutional: He appears well-developed. NAD. HENT: Normocephalicand atraumatic.  Eyes: EOMare normal. No discharge.  Cardiovascular: Normal rate, regular rhythmand normal heart sounds. intact pulses Respiratory: Effort normaland breath sounds normal. No respiratory distress. He has no wheezes. He has no rales.  GI: Soft. Bowel sounds are normal. He exhibits no distension. There is no tenderness.  Genitourinary: +SPC in place with straw colored urine Musculoskeletal: He exhibits edema right greater than left lower ext, trace only  Neurological: He is alertand oriented.  UE 5/5 prox to distal.  RLE: 2/5 HF, KE 3/5 ADF/PF.  LLE: 3/5 HF, 3/5 KE and 4/5 ADF/PF.   Skin: Skin is warm. Sacral wound with vac in place/sealed. Psychiatric: He has a normal mood and affect.    Assessment/Plan: 1. Functional and mobility deficits secondary to polytrauma which require 3+ hours per day of interdisciplinary therapy in a comprehensive  inpatient rehab setting. Physiatrist is providing close team supervision and 24 hour management of active medical problems listed below. Physiatrist and rehab team continue to assess barriers to discharge/monitor patient progress toward functional and medical goals.  Function:  Bathing Bathing position   Position: Wheelchair/chair at sink  Bathing parts Body parts bathed by patient: Left arm, Right arm, Chest, Abdomen, Right upper leg, Left upper leg, Front perineal area (Pt refused LB bathing. Would require assist for buttock, can do all other parts with use of LH sponge) Body parts bathed by helper: Back  Bathing assist Assist Level: Supervision or verbal cues      Upper Body Dressing/Undressing Upper body dressing   What is the patient wearing?: Pull over shirt/dress     Pull over shirt/dress - Perfomed by patient: Thread/unthread right sleeve, Thread/unthread left sleeve, Put head through opening, Pull shirt over trunk Pull over shirt/dress - Perfomed by helper: Pull shirt over trunk        Upper body assist Assist Level: Set up   Set up : To obtain clothing/put away  Lower Body Dressing/Undressing Lower body dressing   What is the patient wearing?: Pants, Non-skid slipper socks   Underwear - Performed by helper: Thread/unthread left underwear leg, Pull underwear up/down, Thread/unthread right underwear leg Pants- Performed by patient: Thread/unthread right pants leg, Thread/unthread left pants leg, Pull pants up/down Pants- Performed by helper: Thread/unthread right pants leg, Thread/unthread left pants leg, Pull pants up/down, Fasten/unfasten pants   Non-skid slipper socks- Performed by helper: Don/doff right sock, Don/doff left sock  Shoes - Performed by helper: Don/doff right shoe, Don/doff left shoe       TED Hose - Performed by helper: Don/doff right TED hose, Don/doff left TED hose  Lower body assist Assist for lower body dressing:  (Total assist)   Set  up : To obtain clothing/put away  Toileting Toileting Toileting activity did not occur: No continent bowel/bladder event   Toileting steps completed by helper: Adjust clothing prior to toileting, Performs perineal hygiene, Adjust clothing after toileting Toileting Assistive Devices: Grab bar or rail  Toileting assist Assist level:  (Total A)   Transfers Chair/bed transfer Chair/bed transfer activity did not occur: Safety/medical concerns Chair/bed transfer method: Stand pivot Chair/bed transfer assist level: Supervision or verbal cues Chair/bed transfer assistive device: Walker, Bedrails, Armrests     Locomotion Ambulation Ambulation activity did not occur: Safety/medical concerns         Wheelchair Wheelchair activity did not occur: Safety/medical concerns Type: Manual Max wheelchair distance: 300 Assist Level: Supervision or verbal cues  Cognition Comprehension Comprehension assist level: Follows complex conversation/direction with no assist  Expression Expression assist level: Expresses complex ideas: With no assist  Social Interaction Social Interaction assist level: Interacts appropriately with others - No medications needed.  Problem Solving Problem solving assist level: Solves complex problems: Recognizes & self-corrects  Memory Memory assist level: Complete Independence: No helper, More than reasonable amount of time   Medical Problem List and Plan: 1.  Multi pelvic fractures status post sacroiliac screw fixation secondary to trauma.   -CIR PT/OT continue--team conference today             -Touchdown weightbearing right leg to facilitate transfers.              -Weightbearing as tolerated left leg for transfers only 8 weeks,  2.  DVT Prophylaxis/Anticoagulation: SQ Lovenox.  -vascular study normal 3. Pain Management: Neurontin 300 mg TID  -continue fentanyl patch at 38mcg   -oxycodone prn/ with two scheduled doses to coincide with therapy  -much better  control  -continue these meds at discharge 4. Acute blood loss anemia.  FeSo4 324 mg bid.    CBC Latest Ref Rng & Units 02/10/2016 01/30/2016 01/20/2016  WBC 4.0 - 10.5 K/uL - 7.8 10.1  Hemoglobin 13.0 - 17.0 g/dL 11.6(L) 9.2(L) 7.7(L)  Hematocrit 39.0 - 52.0 % 34.0(L) 30.5(L) 24.8(L)  Platelets 150 - 400 K/uL - 400 256   5. Neuropsych: This patient is capable of making decisions on his own behalf. 6. Skin/Wound Care/Sacral wound: air mattress.  -s/p I&D, placement of Acell 10/19 by Dr. Marla Roe.  -s/p repeat I&D, vac replaced   -pt to be off wound unless he's on air mattress  --need to set up home plan 7. Fluids/Electrolytes/Nutrition: encourage PO 8. Urethral trauma. Status post suprapubic catheter. Follow-up with changes as directed. Urine remains clear/yellow    -confirm with urology prior to discharge regarding plan 9. Diabetes mellitus with peripheral neuropathy. Hemoglobin A1c 7.9.  Levemir 15 units daily at bedtime  Glucophage 1000 mg twice a day  Glucotrol 10 mg before breakfast and 5 mg before supper  -reasonable control at present   CBG (last 3)   Recent Labs  02/10/16 1624 02/10/16 2044 02/11/16 0652  GLUCAP 163* 101* 118*   -treat any spikes with SSI  10. Hypertension. Norvasc increased to 10mg  on 10/22.  Vitals:   02/10/16 1422 02/11/16 0508  BP: 139/60 135/71  Pulse: 80 82  Resp: 15 16  Temp: 98.8 F (37.1  C) 98.2 F (36.8 C)   11. Hyperlipidemia. Lipitor 12. Constipation    -working on more effective prophylactic program  -senokot s adjusted  -had incontinent bm last night   LOS (Days) 25 A FACE TO FACE EVALUATION WAS PERFORMED  Christian [name],Christian [name] 02/11/2016 9:16 AM

## 2016-02-11 NOTE — Progress Notes (Signed)
Nutrition Follow-up  DOCUMENTATION CODES:   Not applicable  INTERVENTION:  Continue Glucerna Shake po BID, each supplement provides 220 kcal and 10 grams of protein.  Continue 30 ml Prostat po BID, each supplement provides 100 kcal and 15 grams of protein.   Recommend continuation of nutritional supplements at home for wound healing.   NUTRITION DIAGNOSIS:   Increased nutrient needs related to wound healing as evidenced by estimated needs; ongoing  GOAL:   Patient will meet greater than or equal to 90% of their needs; progressed  MONITOR:   PO intake, Supplement acceptance, Labs, Weight trends, Skin, I & O's  REASON FOR ASSESSMENT:   Consult Wound healing  ASSESSMENT:   75 y.o. right handed male with history of hypertension, diabetes mellitus. CT of chest abdomen and pelvis showed sagittal right sacral alar fracture extending across the right sacroiliac joint and involving the right iliac bone. Bilateral parasymphyseal fractures extending into the pubic symphysis posteriorly. Right inferior pubic ramus fracture with minimal displacement. CT retrograde urethrogram due to some hematuria completed follow-up for urology services showed a large extraperitoneal clot anterior to the bladder.  Underwent transsacral screw 2 for pelvic ring fracture. Also noted sacral ulcer 6 x 6 cm in size with Dr. Marla Roe  consulted10/08/2015 and advised air mattress as well as vitamin C and zinc.  PROCEDURE (10/30):  1. Sharpe excisional debridement of sacral ulcer 6 x 10 x 3 cm 2. Placement of Acell (powder 1 gm and sheet 7 x 10 cm) 3. Placement of VAC  Meal completion varied from 25-100%. Pt reports having a good appetite with no other difficulties however. Pt has been consuming his Glucerna and Prostat supplements. Plans for discharge tomorrow. Recommend continuation of supplementation to aid in wound healing.   Diet Order:  Diet Heart Room service appropriate? Yes; Fluid consistency:  Thin  Skin:  Wound (see comment) (Stg 2 on R ankle, incision on coccyx)  Last BM:  10/30  Height:   Ht Readings from Last 1 Encounters:  01/17/16 6\' 2"  (1.88 m)    Weight:   Wt Readings from Last 1 Encounters:  02/06/16 207 lb (93.9 kg)    Ideal Body Weight:  86.36 kg  BMI:  Body mass index is 26.58 kg/m.  Estimated Nutritional Needs:   Kcal:  2200-2500  Protein:  120-135 grams  Fluid:  2.2 - 2.5 L/day  EDUCATION NEEDS:   Education needs addressed  Corrin Parker, MS, RD, LDN Pager # [phone] After hours/ weekend pager # [phone]

## 2016-02-12 ENCOUNTER — Telehealth: Payer: Self-pay | Admitting: *Deleted

## 2016-02-12 ENCOUNTER — Inpatient Hospital Stay (HOSPITAL_COMMUNITY): Payer: Medicare Other | Admitting: Physical Therapy

## 2016-02-12 LAB — GLUCOSE, CAPILLARY
Glucose-Capillary: 117 mg/dL — ABNORMAL HIGH (ref 65–99)
Glucose-Capillary: 139 mg/dL — ABNORMAL HIGH (ref 65–99)

## 2016-02-12 MED ORDER — OXYCODONE HCL 10 MG PO TABS: 10.0000 mg | ORAL_TABLET | Freq: Two times a day (BID) | ORAL | 0 refills | Status: DC

## 2016-02-12 MED ORDER — ASCORBIC ACID 500 MG PO TABS: 500.0000 mg | ORAL_TABLET | Freq: Two times a day (BID) | ORAL | 1 refills | Status: DC

## 2016-02-12 MED ORDER — OXYCODONE HCL 10 MG PO TABS: 10.0000 mg | ORAL_TABLET | Freq: Four times a day (QID) | ORAL | 0 refills | Status: DC | PRN

## 2016-02-12 MED ORDER — ZINC SULFATE 220 (50 ZN) MG PO CAPS: 220.0000 mg | ORAL_CAPSULE | Freq: Every day | ORAL | 1 refills | Status: DC

## 2016-02-12 MED ORDER — FERROUS GLUCONATE 324 (38 FE) MG PO TABS: 324.0000 mg | ORAL_TABLET | Freq: Two times a day (BID) | ORAL | 3 refills | Status: DC

## 2016-02-12 MED ORDER — GABAPENTIN 300 MG PO CAPS: 300.0000 mg | ORAL_CAPSULE | Freq: Three times a day (TID) | ORAL | 1 refills | Status: DC

## 2016-02-12 MED ORDER — FENTANYL 50 MCG/HR TD PT72: 50.0000 ug | MEDICATED_PATCH | TRANSDERMAL | 0 refills | Status: DC

## 2016-02-12 MED ORDER — INSULIN DETEMIR 100 UNIT/ML ~~LOC~~ SOLN: 15.0000 [IU] | Freq: Every day | SUBCUTANEOUS | 11 refills | Status: DC

## 2016-02-12 MED ORDER — SENNOSIDES-DOCUSATE SODIUM 8.6-50 MG PO TABS: 2.0000 | ORAL_TABLET | Freq: Two times a day (BID) | ORAL | Status: DC

## 2016-02-12 MED ORDER — INSULIN ASPART 100 UNIT/ML ~~LOC~~ SOLN: 4.0000 [IU] | Freq: Three times a day (TID) | SUBCUTANEOUS | 11 refills | Status: DC

## 2016-02-12 MED ORDER — CALCIUM CARBONATE 1250 (500 CA) MG PO TABS: 1.0000 | ORAL_TABLET | Freq: Three times a day (TID) | ORAL | 1 refills | Status: DC

## 2016-02-12 MED ORDER — AMLODIPINE BESYLATE 10 MG PO TABS: 10.0000 mg | ORAL_TABLET | Freq: Every day | ORAL | 1 refills | Status: DC

## 2016-02-12 MED ORDER — ATORVASTATIN CALCIUM 80 MG PO TABS: 80.0000 mg | ORAL_TABLET | Freq: Every day | ORAL | 0 refills | Status: DC

## 2016-02-12 MED ORDER — METFORMIN HCL 1000 MG PO TABS: 1000.0000 mg | ORAL_TABLET | Freq: Two times a day (BID) | ORAL | 1 refills | Status: DC

## 2016-02-12 MED ORDER — TAB-A-VITE/IRON PO TABS: 1.0000 | ORAL_TABLET | Freq: Every day | ORAL | 0 refills | Status: DC

## 2016-02-12 MED ORDER — PANTOPRAZOLE SODIUM 40 MG PO TBEC: 40.0000 mg | DELAYED_RELEASE_TABLET | Freq: Two times a day (BID) | ORAL | 1 refills | Status: DC

## 2016-02-12 NOTE — Progress Notes (Signed)
Social Work  Discharge Note  The overall goal for the admission was met for:   Discharge location: Yes - home with wife and family to provide 24/7 assistance  Length of Stay: Yes - 16 days  Discharge activity level: Yes - supervision / min assistance  Home/community participation: Yes  Services provided included: MD, RD, PT, OT, RN, TR, Pharmacy and [place]: Medicare and Private Insurance: [place]  Follow-up services arranged: Home Health: RN, PT, OT via [place], DME: 18x18 lightweight w/c with Jay 2 Deep contour cushion, rolling walker, hospital bed, 3n1 commode via [place], Other: wound VAC via [facility] and Patient/Family has no preference for HH/DME agencies  Comments (or additional information):  Patient/Family verbalized understanding of follow-up arrangements: Yes  Individual responsible for coordination of the follow-up plan: pt  Confirmed correct DME delivered: Alaysia Lightle 02/12/2016    Elisea Khader

## 2016-02-12 NOTE — Progress Notes (Signed)
Pt. Got d/c instructions and follow up appointments.Pt. Ready to go home with family.

## 2016-02-12 NOTE — Progress Notes (Signed)
Social Work Patient ID: Christian [name], male   DOB: [birth year], 75 y.o.   MRN: [id number]  Lowella Curb, LCSW Social Worker Signed   Patient Care Conference Date of Service: 02/12/2016  1:24 PM      Hide copied text Hover for attribution information Inpatient RehabilitationTeam Conference and Plan of Care Update Date: 02/11/2016   Time: 2:00 PM      Patient Name: Christian [name]      Medical Record Number: [id number]  Date of Birth: Sep 20, 1940 Sex: Male         Room/Bed: 4W21C/4W21C-01 Payor Info: Payor: MEDICARE / Plan: MEDICARE PART A AND B / Product Type: *No Product type* /     Admitting Diagnosis: Pelvis FX SACRAL ULCER sacral wound  Admit Date/Time:  01/17/2016  4:59 PM Admission Comments: No comment available    Primary Diagnosis:  Multiple pelvic fractures (HCC) Principal Problem: Multiple pelvic fractures [facility])       Patient Active Problem List    Diagnosis Date Noted  . DM type 2 with diabetic peripheral neuropathy ([place])    . Constipation due to pain medication    . Benign essential HTN    . Post-operative pain    . Sacral decubitus ulcer 01/17/2016  . Accident caused by farm tractor 01/15/2016  . Urethral injury 01/15/2016  . Acute blood loss anemia 01/15/2016  . Acute kidney injury ([place]) 01/15/2016  . Multiple pelvic fractures ([place]) 01/10/2016      Expected Discharge Date: Expected Discharge Date: 02/12/16   Team Members Present: Physician leading conference: Dr. Alger Simons Social Worker Present: Lennart Pall, LCSW Nurse Present: Heather Roberts, RN PT Present: Kem Parkinson, PT OT Present: Napoleon Form, OT SLP Present: Gunnar Fusi, SLP PPS Coordinator present : Daiva Nakayama, RN, CRRN       Current Status/Progress Goal Weekly Team Focus  Medical   s/p second I&D. pain better. vac in place  see prior, finalize planning for home  pain, wound care, nutrition, bowel and bladder regulation   Bowel/Bladder   incontinent of bowel. LBM 02/10/2016, suprapubic  catheter intact  Managed bowel and bladder  Provide suprapubic catheter care q shift. Assess needs to bulk up stool to decrease incontinent episodes   Swallow/Nutrition/ Hydration             ADL's   Min A LB bathing/ dressing; Supervision- min A functional transfers; Set-up UB bathing/dressing and grooming; min A toileting  min A transfers, LB self care, bathing; downgrading toileting task due to decreased standing balance/ tolerance  Family training and d/c planning.    Mobility   minA bed mobility for RLE management, S sit <>stand and stand pivot transfer, minA car transfer  supervision w/c level transfers; mod I w/c mobility  patient and family education, HEP   Communication             Safety/Cognition/ Behavioral Observations           Pain   OXY IR 10mg  scheduled and available prn  <3 taken yesterday  Monitor for effectiveness   Skin   Sacral wound with Wound Vac post I&D.  No additonal skin breakdwon  monitor q shift       *See Care Plan and progress notes for long and short-term goals.   Barriers to Discharge: wound care, pain, inconsistent bowel habits   Possible Resolutions to Barriers:  working on scheduled bowel regimen. HH follow up for vac,skin. establish surgical follow up also   Discharge  Planning/Teaching Needs:  Home with wife who will provide 24/7 assistance.  Teaching to be final in the morning after car transfers.   Team Discussion:  I/D yesterday and VAC in place.  Cath to be changed today.  Overall supervision to min assist and ready for d/c tomorrow.  Revisions to Treatment Plan:  None    Continued Need for Acute Rehabilitation Level of Care: The patient requires daily medical management by a physician with specialized training in physical medicine and rehabilitation for the following conditions: Daily direction of a multidisciplinary physical rehabilitation program to ensure safe treatment while eliciting the highest outcome that is of practical value to the  patient.: Yes Daily medical management of patient stability for increased activity during participation in an intensive rehabilitation regime.: Yes Daily analysis of laboratory values and/or radiology reports with any subsequent need for medication adjustment of medical intervention for : Post surgical problems;Other;Wound care problems   Mayanna Garlitz 02/12/2016, 1:24 PM      Lowella Curb, LCSW Social Worker Signed   Patient Care Conference Date of Service: 02/05/2016  1:38 PM      Hide copied text Hover for attribution information Inpatient RehabilitationTeam Conference and Plan of Care Update Date: 02/04/2016   Time: 10:00 AM      Patient Name: Christian [name]      Medical Record Number: [id number]  Date of Birth: [birth year] Sex: Male         Room/Bed: 4W21C/4W21C-01 Payor Info: Payor: MEDICARE / Plan: MEDICARE PART A AND B / Product Type: *No Product type* /     Admitting Diagnosis: Pelvis FX SACRAL ULCER  Admit Date/Time:  01/17/2016  4:59 PM Admission Comments: No comment available    Primary Diagnosis:  Multiple pelvic fractures (HCC) Principal Problem: Multiple pelvic fractures [facility])       Patient Active Problem List    Diagnosis Date Noted  . DM type 2 with diabetic peripheral neuropathy ([place])    . Constipation due to pain medication    . Benign essential HTN    . Post-operative pain    . Sacral decubitus ulcer 01/17/2016  . Accident caused by farm tractor 01/15/2016  . Urethral injury 01/15/2016  . Acute blood loss anemia 01/15/2016  . Acute kidney injury ([place]) 01/15/2016  . Multiple pelvic fractures ([place]) 01/10/2016      Expected Discharge Date: Expected Discharge Date: 02/12/16   Team Members Present: Physician leading conference: Dr. Delice Lesch Social Worker Present: Lennart Pall, LCSW Nurse Present: Dorien Chihuahua, RN PT Present: Kem Parkinson, PT OT Present: Napoleon Form, OT SLP Present: Weston Anna, SLP PPS Coordinator present : Daiva Nakayama, RN,  CRRN       Current Status/Progress Goal Weekly Team Focus  Medical   Multi pelvic fractures status post sacroiliac screw fixation secondary to trauma,  Improve transfers, mobility, wound  See above   Bowel/Bladder   Suprapubic cath in place; LBM 10/25 after SSE; bowel meds increased  Min assist  Assess and treat for constipation as needed; provide suprapubic cath care   Swallow/Nutrition/ Hydration             ADL's   Mod A LB bathing; Set-up/ supervision UB bathing/dressing; min A stand pivot functional transfers with RW  min A transfers, LB self care, bathing; downgrading toileting task due to decreased standing balance/ tolerance  Upright tolerance, functional transfers, toileting, LB dressing, family ed, d/c planning.    Mobility   min/modA bed mobility, minA sit <>stand, min  guard stand pivot transfers  supervision w/c level transfers; mod I w/c mobility  Bed mobility, stand pivot transfers, w/c propulsion, d/c planning   Communication             Safety/Cognition/ Behavioral Observations           Pain   scheduled oxy IR effective in controlling pain  < 3  Assess and treat for pain q shift and prn   Skin   Sacral wound with new necrotic tissue which may need surgical debridement; new stage II to right lateral ankle; prevalon boots in place, airmattress  Max assist  Assess skin q shift and prn; perform dressing changes per MD order.     Rehab Goals Patient on target to meet rehab goals: Yes *See Care Plan and progress notes for long and short-term goals.   Barriers to Discharge: HTN, DM, ABLA, Wound VAC in place   Possible Resolutions to Barriers:  Adjust chronic medical conditional, VAC to be changed tomorrow   Discharge Planning/Teaching Needs:  planning home with wife who can provide 24/7 assistance.  Teaching is ongoing with wife.   Team Discussion:  VAc to be changed tomorrow and Monday.  Reaching supervision to min/guard transfers.  Limited on how long he can tolerate up  in chair.  Recommend decrease in tx schedule to 15/7  Revisions to Treatment Plan:  Change in tx schedule    Continued Need for Acute Rehabilitation Level of Care: The patient requires daily medical management by a physician with specialized training in physical medicine and rehabilitation for the following conditions: Daily direction of a multidisciplinary physical rehabilitation program to ensure safe treatment while eliciting the highest outcome that is of practical value to the patient.: Yes Daily medical management of patient stability for increased activity during participation in an intensive rehabilitation regime.: Yes Daily analysis of laboratory values and/or radiology reports with any subsequent need for medication adjustment of medical intervention for : Post surgical problems;Diabetes problems;Wound care problems;Blood pressure problems   Truth Wolaver 02/05/2016, 3:37 PM      Lowella Curb, LCSW Social Worker Signed   Patient Care Conference Date of Service: 01/28/2016  4:04 PM      Hide copied text Hover for attribution information Inpatient RehabilitationTeam Conference and Plan of Care Update Date: 01/28/2016   Time: 2:10 PM      Patient Name: Christian [name]      Medical Record Number: [id number]  Date of Birth: 13-May-1940 Sex: Male         Room/Bed: 4W21C/4W21C-01 Payor Info: Payor: MEDICARE / Plan: MEDICARE PART A AND B / Product Type: *No Product type* /     Admitting Diagnosis: Pelvis FX  Admit Date/Time:  01/17/2016  4:59 PM Admission Comments: No comment available    Primary Diagnosis:  Multiple pelvic fractures (HCC) Principal Problem: Multiple pelvic fractures [facility])       Patient Active Problem List    Diagnosis Date Noted  . Sacral decubitus ulcer 01/17/2016  . Accident caused by farm tractor 01/15/2016  . Urethral injury 01/15/2016  . Acute blood loss anemia 01/15/2016  . Acute kidney injury ([place]) 01/15/2016  . Multiple pelvic fractures ([place]) 01/10/2016       Expected Discharge Date: Expected Discharge Date: 02/12/16   Team Members Present: Physician leading conference: Dr. Alger Simons Social Worker Present: Lennart Pall, LCSW Nurse Present: Heather Roberts, RN PT Present: Kem Parkinson, PT OT Present: [name], OT SLP Present: Weston Anna, SLP PPS Coordinator  present : Daiva Nakayama, RN, CRRN       Current Status/Progress Goal Weekly Team Focus  Medical   pain improving but still a factor. hydro therapy for wound showin progress.  pain control  pain mgt, wound care, regulation of bowels   Bowel/Bladder   SP cath, continent bowel.  SP managed, no s/s infection. BM QD, QOD with PRNs as needed.  Monitor.   Swallow/Nutrition/ Hydration             ADL's   min A - mod A for LB self care, setup to min A for UB, max A +2 for A/P transfers; tolerate sitting in recliner today  min A transfers, LB self care, bathing; min A for toileting on BSC  sitting tolerance in upright position, transfers in and out of bed, use of AE for LB dressing,    Mobility   maxA bed mobility and +2 A/P transfers, able to tolerate OOB <1 hour in w/c  supervision w/c level transfers; mod I w/c mobility  pain management, OOB tolerance, bed mobility, transfers   Communication             Safety/Cognition/ Behavioral Observations           Pain   Scheduled Oxy IR, occasional PRN dose at HS.  Managed at goal 3/10  Monitor, encourage alternative methods of pain control; repositioning.   Skin   Sacral wound with hydrotherapy, santyl, air mattress.  Would in healing process at DC, no new breakdown, no injuries this admission.  Monitor.     Rehab Goals Patient on target to meet rehab goals: Yes *See Care Plan and progress notes for long and short-term goals.   Barriers to Discharge: pain mgt, ortho precautions   Possible Resolutions to Barriers:  continued adjustment of analgesic regimen   Discharge Planning/Teaching Needs:  planning home with wife who can  provide 24/7 assistance.  Teaching closer to d/c   Team Discussion:  Up in recliner today with w/c cushion and reclined is all he could really tolerate.  Poor tolerance for sitting in w/c with max x 1 hr.  Pt avoids any position that causes any amount of pain.  Currently heavy assistance +2 for transfers.  Is dressing at bed level with min assist.  May need to be more aggressive with pain management.  Team feels need to extend LOS x week  Revisions to Treatment Plan:  Change in d/c date to 11/1    Continued Need for Acute Rehabilitation Level of Care: The patient requires daily medical management by a physician with specialized training in physical medicine and rehabilitation for the following conditions: Daily direction of a multidisciplinary physical rehabilitation program to ensure safe treatment while eliciting the highest outcome that is of practical value to the patient.: Yes Daily medical management of patient stability for increased activity during participation in an intensive rehabilitation regime.: Yes Daily analysis of laboratory values and/or radiology reports with any subsequent need for medication adjustment of medical intervention for : Post surgical problems;Other   Carmeline Kowal 01/29/2016, 10:14 AM      Lowella Curb, LCSW Social Worker Signed   Patient Care Conference Date of Service: 01/28/2016  4:04 PM      Hide copied text Hover for attribution information Inpatient RehabilitationTeam Conference and Plan of Care Update Date: 01/28/2016   Time: 2:10 PM      Patient Name: Christian [name]      Medical Record Number: [id number]  Date of  Birth: 04/20/1940 Sex: Male         Room/Bed: 4W21C/4W21C-01 Payor Info: Payor: MEDICARE / Plan: MEDICARE PART A AND B / Product Type: *No Product type* /     Admitting Diagnosis: Pelvis FX  Admit Date/Time:  01/17/2016  4:59 PM Admission Comments: No comment available    Primary Diagnosis:  Multiple pelvic fractures (HCC) Principal  Problem: Multiple pelvic fractures [facility])       Patient Active Problem List    Diagnosis Date Noted  . Sacral decubitus ulcer 01/17/2016  . Accident caused by farm tractor 01/15/2016  . Urethral injury 01/15/2016  . Acute blood loss anemia 01/15/2016  . Acute kidney injury ([place]) 01/15/2016  . Multiple pelvic fractures ([place]) 01/10/2016      Expected Discharge Date: Expected Discharge Date: 02/12/16   Team Members Present: Physician leading conference: Dr. Alger Simons Social Worker Present: Lennart Pall, LCSW Nurse Present: Heather Roberts, RN PT Present: Kem Parkinson, PT OT Present: [name], OT SLP Present: Weston Anna, SLP PPS Coordinator present : Daiva Nakayama, RN, CRRN       Current Status/Progress Goal Weekly Team Focus  Medical   pain improving but still a factor. hydro therapy for wound showin progress.  pain control  pain mgt, wound care, regulation of bowels   Bowel/Bladder   SP cath, continent bowel.  SP managed, no s/s infection. BM QD, QOD with PRNs as needed.  Monitor.   Swallow/Nutrition/ Hydration             ADL's   min A - mod A for LB self care, setup to min A for UB, max A +2 for A/P transfers; tolerate sitting in recliner today  min A transfers, LB self care, bathing; min A for toileting on BSC  sitting tolerance in upright position, transfers in and out of bed, use of AE for LB dressing,    Mobility   maxA bed mobility and +2 A/P transfers, able to tolerate OOB <1 hour in w/c  supervision w/c level transfers; mod I w/c mobility  pain management, OOB tolerance, bed mobility, transfers   Communication             Safety/Cognition/ Behavioral Observations           Pain   Scheduled Oxy IR, occasional PRN dose at HS.  Managed at goal 3/10  Monitor, encourage alternative methods of pain control; repositioning.   Skin   Sacral wound with hydrotherapy, santyl, air mattress.  Would in healing process at DC, no new breakdown, no injuries this admission.   Monitor.     Rehab Goals Patient on target to meet rehab goals: Yes *See Care Plan and progress notes for long and short-term goals.   Barriers to Discharge: pain mgt, ortho precautions   Possible Resolutions to Barriers:  continued adjustment of analgesic regimen   Discharge Planning/Teaching Needs:  planning home with wife who can provide 24/7 assistance.  Teaching closer to d/c   Team Discussion:  Up in recliner today with w/c cushion and reclined is all he could really tolerate.  Poor tolerance for sitting in w/c with max x 1 hr.  Pt avoids any position that causes any amount of pain.  Currently heavy assistance +2 for transfers.  Is dressing at bed level with min assist.  May need to be more aggressive with pain management.  Team feels need to extend LOS x week  Revisions to Treatment Plan:  Change in d/c date to 11/1  Continued Need for Acute Rehabilitation Level of Care: The patient requires daily medical management by a physician with specialized training in physical medicine and rehabilitation for the following conditions: Daily direction of a multidisciplinary physical rehabilitation program to ensure safe treatment while eliciting the highest outcome that is of practical value to the patient.: Yes Daily medical management of patient stability for increased activity during participation in an intensive rehabilitation regime.: Yes Daily analysis of laboratory values and/or radiology reports with any subsequent need for medication adjustment of medical intervention for : Post surgical problems;Other   Gwendy Boeder, [place] 01/29/2016, 10:14 AM

## 2016-02-12 NOTE — Discharge Summary (Signed)
NAMEJUSUF, Christian [name] NO.:  [id number]  MEDICAL RECORD NO.:  [id number]  LOCATION:  4W21C                        FACILITY:  [facility]  PHYSICIAN:  Christian [name], M.D.DATE OF BIRTH:  1940-04-21  DATE OF ADMISSION:  01/17/2016 DATE OF DISCHARGE:  02/13/2016                              DISCHARGE SUMMARY   DISCHARGE DIAGNOSES: 1. Multiple pelvic fractures, status post sacroiliac screw fixation     secondary to trauma. 2. Subcutaneous Lovenox for deep venous thrombosis prophylaxis. 3. Pain management. 4. Acute blood loss anemia. 5. Urethral trauma, status post suprapubic catheter. 6. Diabetes mellitus, peripheral neuropathy. 7. Hypertension. 8. Sacral wound. 9. Constipation.  HISTORY OF PRESENT ILLNESS:  [name] with history of hypertension, diabetes mellitus.  He lives with his wife, independent prior to admission.  Presented on January 10, 2016, after he is working on tractor, he slipped when the tractor slipped into gear, pinned him between the tractor and a pole.  No loss of conscious. CT of chest and abdomen showed sagittal right sacral fracture extending across the right sacroiliac joint and involving the right iliac bone. Bilateral parasymphyseal fractures extending into the pubic symphysis posteriorly.  Right inferior pubic ramus fracture.  CT retrograde urethrogram due to some hematuria, follow up per Urology Services showed large extraperitoneal clot anterior to the bladder.  Bedside cystoscopy unsuccessful due to seemingly significant urethral injury near the membrane of the urethra and a suprapubic tube was placed per Interventional Radiology, January 10, 2016.  Underwent transsacral screw x2 for pelvic fracture, touchdown weightbearing.  Also noted sacral ulcer, 6 x 6 cm in size.  Dr. Marla [name] had been consulted on January 16, 2016, advised air mattress as well as vitamin C and zinc. Hospital course, pain  management.  Subcutaneous Lovenox for DVT prophylaxis.  Acute blood loss anemia, 7.3, and monitored.  The patient was admitted for comprehensive rehab program.  PAST MEDICAL HISTORY:  See discharge diagnoses.  SOCIAL HISTORY:  Lives with wife, independent prior to admission.  FUNCTIONAL STATUS:  Upon admission to [facility] [place], +2 physical assist, stand pivot transfers; +2 physical assist, supine to sit; max- total assist, activities of daily living.  PHYSICAL EXAMINATION:  VITAL SIGNS:  Blood pressure 139/62, pulse 80, temperature 97, respirations 18. GENERAL:  This was an alert male, oriented x3. LUNGS:  Clear to auscultation without wheeze. CARDIAC:  Regular rate and rhythm without murmur. ABDOMEN:  Soft, nontender.  Good bowel sounds. EXTREMITIES:  Right leg tender to basic palpation and passive range of motion at the hip and knee.  Sacral wound dressed.  Operative incisions clean and dry.  Suprapubic catheter in place.  REHABILITATION HOSPITAL COURSE:  The patient was admitted to Inpatient Rehab Services with therapies initiated on a 3-hour daily basis, consisting of physical therapy, occupational therapy and rehabilitation nursing.  The following issues were addressed during the patient's rehabilitation stay.  Pertaining to [name] multiple pelvic fractures, status post sacroiliac screw fixation, touchdown weightbearing right lower extremity, he would follow up Orthopedic Service, Dr. Marcelino [name].  Neurovascular sensation intact.  Subcutaneous Lovenox for DVT prophylaxis.  Venous Doppler studies negative.  Pain management with the use  of Neurontin 300 mg 3 times a day as well as fentanyl patch and oxycodone.  Acute blood loss anemia stable, continued on iron supplement.  Followed closely by Plastic Surgery for sacral wound, air mattress in place.  He had undergone irrigation, debridement, placement of ACell On January 30, 2016, per Dr. Marla [name] and again repeated with [facility]  placement, which had been arranged for home.  In regard to urethral injury, he had undergone suprapubic catheter, January 10, 2016, per Interventional Radiology; Dr. Sandi [name]. Contacts made to Interventional Radiology for exchange of suprapubic tube prior to discharge home.  Diabetes mellitus with, peripheral neuropathy, hemoglobin A1c of 7.9.  The patient on Glucotrol as well as glipizide, Levemir insulin, full diabetic teaching.  Bouts of constipation, resolved with laxative assistance.  Blood pressure was controlled.  Norvasc had recently been increased to 10 mg daily.  The patient received weekly collaborative interdisciplinary team conferences to discuss estimated length of stay, family teaching, any barriers to his discharge.  Supine to sit with supervision, increased time using bed rails, stand pivot transfers, wheelchair to bed with rolling walker, close supervision.  Propelled his wheelchair supervision, stand pivot wheelchair to car with supervision and assist for managing catheter bag and wound VAC.  Activities of daily living and homemaking.  Bed mobility completed with supervision using hospital bed functions, minimal assist stand pivot transfers.  Full family teaching was completed.  Plan, discharge to home.  DISCHARGE MEDICATIONS: 1. Norvasc 10 mg p.o. daily. 2. Lipitor 80 mg p.o. daily. 3. Calcium carbonate 500 mg p.o. t.i.d. 4. Fentanyl patch 50 mcg, change every 72 hours. 5. Fergon 324 mg p.o. b.i.d. 6. Flonase two sprays each nostril daily. 7. Neurontin 300 mg p.o. t.i.d. 8. Glipizide 10 mg p.o. daily and 5 mg with supper. 9. Levemir 15 units at bedtime. 10.Glucophage 1000 mg p.o. b.i.d. 11.Multivitamin daily. 12.Oxycodone immediate release 10 mg p.o. b.i.d. x2 weeks. 13.Protonix 40 mg p.o. b.i.d. 14.Senokot-S two tablets p.o. b.i.d. 15.Vitamin C 500 mg p.o. b.i.d. 16.Zinc sulfate 220 mg p.o. daily. 17.Oxycodone immediate release 10 mg p.o. every 6 hours as  needed for     pain.  DIET:  Diabetic diet.  FOLLOWUP:  He would follow up with Dr. Marla [name] of Plastic Surgery, February 24, 2016; Dr. Altamese [name], call for appointment; Dr. Sandi [name], Interventional Radiology; Dr. Carolan Clines, Urology Service, call for appointment; Dr. Jene Every, February 19, 2016; Dr. Alger Simons, at the Outpatient Rehab Service office as needed.  SPECIAL INSTRUCTIONS:  Home Health nurse had been arranged for wound VAC changes as directed.     Lauraine Rinne, P.A.   ______________________________ Christian [name], M.D.    DA/MEDQ  D:  02/11/2016  T:  02/12/2016  Job:  [id number]  cc:   Jene Every, MD Astrid Divine. Christian [name], M.D. Christian Mariscal, MD Theodoro Kos, DO Sigmund I. Gaynelle Arabian, M.D.

## 2016-02-12 NOTE — Patient Care Conference (Signed)
Inpatient RehabilitationTeam Conference and Plan of Care Update Date: 02/11/2016   Time: 2:00 PM    Patient Name: Christian [name]      Medical Record Number: [id number]  Date of Birth: February 13, 1941 Sex: Male         Room/Bed: 4W21C/4W21C-01 Payor Info: Payor: MEDICARE / Plan: MEDICARE PART A AND B / Product Type: *No Product type* /    Admitting Diagnosis: Pelvis FX SACRAL ULCER sacral wound  Admit Date/Time:  01/17/2016  4:59 PM Admission Comments: No comment available   Primary Diagnosis:  Multiple pelvic fractures (HCC) Principal Problem: Multiple pelvic fractures [facility])  Patient Active Problem List   Diagnosis Date Noted  . DM type 2 with diabetic peripheral neuropathy ([place])   . Constipation due to pain medication   . Benign essential HTN   . Post-operative pain   . Sacral decubitus ulcer 01/17/2016  . Accident caused by farm tractor 01/15/2016  . Urethral injury 01/15/2016  . Acute blood loss anemia 01/15/2016  . Acute kidney injury ([place]) 01/15/2016  . Multiple pelvic fractures ([place]) 01/10/2016    Expected Discharge Date: Expected Discharge Date: 02/12/16  Team Members Present: Physician leading conference: Dr. Alger Simons Social Worker Present: Lennart Pall, LCSW Nurse Present: Heather Roberts, RN PT Present: Kem Parkinson, PT OT Present: Napoleon Form, OT SLP Present: Gunnar Fusi, SLP PPS Coordinator present : Daiva Nakayama, RN, CRRN     Current Status/Progress Goal Weekly Team Focus  Medical   s/p second I&D. pain better. vac in place  see prior, finalize planning for home  pain, wound care, nutrition, bowel and bladder regulation   Bowel/Bladder   incontinent of bowel. LBM 02/10/2016, suprapubic catheter intact  Managed bowel and bladder  Provide suprapubic catheter care q shift. Assess needs to bulk up stool to decrease incontinent episodes   Swallow/Nutrition/ Hydration             ADL's   Min A LB bathing/ dressing; Supervision- min A functional transfers;  Set-up UB bathing/dressing and grooming; min A toileting  min A transfers, LB self care, bathing; downgrading toileting task due to decreased standing balance/ tolerance  Family training and d/c planning.    Mobility   minA bed mobility for RLE management, S sit <>stand and stand pivot transfer, minA car transfer  supervision w/c level transfers; mod I w/c mobility  patient and family education, HEP   Communication             Safety/Cognition/ Behavioral Observations            Pain   OXY IR 10mg  scheduled and available prn  <3 taken yesterday  Monitor for effectiveness   Skin   Sacral wound with Wound Vac post I&D.  No additonal skin breakdwon  monitor q shift      *See Care Plan and progress notes for long and short-term goals.  Barriers to Discharge: wound care, pain, inconsistent bowel habits    Possible Resolutions to Barriers:  working on scheduled bowel regimen. HH follow up for vac,skin. establish surgical follow up also    Discharge Planning/Teaching Needs:  Home with wife who will provide 24/7 assistance.  Teaching to be final in the morning after car transfers.   Team Discussion:  I/D yesterday and VAC in place.  Cath to be changed today.  Overall supervision to min assist and ready for d/c tomorrow.  Revisions to Treatment Plan:  None   Continued Need for Acute Rehabilitation Level of Care: The  patient requires daily medical management by a physician with specialized training in physical medicine and rehabilitation for the following conditions: Daily direction of a multidisciplinary physical rehabilitation program to ensure safe treatment while eliciting the highest outcome that is of practical value to the patient.: Yes Daily medical management of patient stability for increased activity during participation in an intensive rehabilitation regime.: Yes Daily analysis of laboratory values and/or radiology reports with any subsequent need for medication adjustment of  medical intervention for : Post surgical problems;Other;Wound care problems  Sinclair Alligood 02/12/2016, 1:24 PM

## 2016-02-12 NOTE — Progress Notes (Signed)
[place] PHYSICAL MEDICINE & REHABILITATION     PROGRESS NOTE    Subjective/Complaints: Feeling fairly well. Vac stopped working an hour or two ago.  Unclear as to why. Pt slept. Anxious to go home   ROS: Pt denies fever, rash/itching, headache, blurred or double vision, nausea, vomiting, abdominal pain, diarrhea, chest pain, shortness of breath, palpitations, dysuria, dizziness, neck   bleeding, anxiety, or depression   Objective: Vital Signs: Blood pressure 122/65, pulse 84, temperature 98.4 F (36.9 C), temperature source Oral, resp. rate 18, height 6\' 2"  (1.88 m), weight 94.3 kg (208 lb), SpO2 95 %. Ir Catheter Tube Change  Result Date: 02/11/2016 INDICATION: Pain at exit site of indwelling 12 French suprapubic bladder drainage catheter originally placed on 01/10/2016. EXAM: IR CATHETER TUBE CHANGE COMPARISON:  01/10/2016 MEDICATIONS: None ANESTHESIA/SEDATION: None CONTRAST:  10 mL Isovue-300 - administered into the collecting system(s) FLUOROSCOPY TIME:  Fluoroscopy Time: 12 seconds (2 mGy). COMPLICATIONS: None immediate. PROCEDURE: Informed written consent was obtained from the patient after a thorough discussion of the procedural risks, benefits and alternatives. All questions were addressed. Maximal Sterile Barrier Technique was utilized including caps, mask, sterile gowns, sterile gloves, sterile drape, hand hygiene and skin antiseptic. A timeout was performed prior to the initiation of the procedure. The pre-existing 12 French suprapubic tube was prepped and draped. Diluted contrast was injected into the bladder lumen. The tube was cut and removed over a guidewire. A new 12 French drainage catheter was advanced over the wire and formed in the bladder lumen. Catheter positioning was confirmed by a fluoroscopic spot image. The catheter was secured with a Prolene retention suture and connected to a gravity drainage bag. FINDINGS: The pre-existing catheter is normally positioned in the  bladder lumen. Catheter exchange was performed with the new catheter extending into the bladder lumen. There is return of urine from the suprapubic catheter after replacement. IMPRESSION: Exchange of indwelling 12 French suprapubic bladder drainage catheter. Electronically Signed   By: Aletta Edouard M.D.   On: 02/11/2016 16:20    Recent Labs  02/10/16 1044  HGB 11.6*  HCT 34.0*    Recent Labs  02/10/16 1044  NA 140  K 4.4  GLUCOSE 171*   CBG (last 3)   Recent Labs  02/11/16 1657 02/11/16 2049 02/12/16 0629  GLUCAP 121* 166* 117*    Wt Readings from Last 3 Encounters:  02/12/16 94.3 kg (208 lb)  01/10/16 100.7 kg (222 lb)    Physical Exam:  Constitutional: He appears well-developed. NAD. HENT: Normocephalicand atraumatic.  Eyes: EOMare normal. No discharge.  Cardiovascular: Normal rate, regular rhythmand normal heart sounds. intact pulses Respiratory: Effort normaland breath sounds normal. No respiratory distress. He has no wheezes. He has no rales.  GI: Soft. Bowel sounds are normal. He exhibits no distension. There is no tenderness.  Genitourinary: +SPC in place with straw colored urine Musculoskeletal: He exhibits edema right greater than left lower ext, trace only  Neurological: He is alertand oriented.  UE 5/5 prox to distal.  RLE: 2/5 HF, KE 3/5 ADF/PF.  LLE: 3/5 HF, 3/5 KE and 4/5 ADF/PF.   Skin: Skin is warm. Sacral wound with vac in place/sealed. Psychiatric: He has a normal mood and affect.    Assessment/Plan: 1. Functional and mobility deficits secondary to polytrauma which require 3+ hours per day of interdisciplinary therapy in a comprehensive inpatient rehab setting. Physiatrist is providing close team supervision and 24 hour management of active medical problems listed below. Physiatrist and rehab team  continue to assess barriers to discharge/monitor patient progress toward functional and medical goals.  Function:  Bathing Bathing  position   Position: Wheelchair/chair at sink  Bathing parts Body parts bathed by patient: Left arm, Right arm, Chest, Abdomen, Right upper leg, Left upper leg, Front perineal area (Pt refused LB bathing. Would require assist for buttock, can do all other parts with use of LH sponge) Body parts bathed by helper: Back  Bathing assist Assist Level: Supervision or verbal cues      Upper Body Dressing/Undressing Upper body dressing   What is the patient wearing?: Pull over shirt/dress     Pull over shirt/dress - Perfomed by patient: Thread/unthread right sleeve, Thread/unthread left sleeve, Put head through opening, Pull shirt over trunk Pull over shirt/dress - Perfomed by helper: Pull shirt over trunk        Upper body assist Assist Level: Set up   Set up : To obtain clothing/put away  Lower Body Dressing/Undressing Lower body dressing   What is the patient wearing?: Pants, Non-skid slipper socks   Underwear - Performed by helper: Thread/unthread left underwear leg, Pull underwear up/down, Thread/unthread right underwear leg Pants- Performed by patient: Thread/unthread right pants leg, Thread/unthread left pants leg, Pull pants up/down Pants- Performed by helper: Thread/unthread right pants leg, Thread/unthread left pants leg, Pull pants up/down, Fasten/unfasten pants   Non-skid slipper socks- Performed by helper: Don/doff right sock, Don/doff left sock       Shoes - Performed by helper: Don/doff right shoe, Don/doff left shoe       TED Hose - Performed by helper: Don/doff right TED hose, Don/doff left TED hose  Lower body assist Assist for lower body dressing:  (Total assist)   Set up : To obtain clothing/put away  Toileting Toileting Toileting activity did not occur:  (Simulated) Toileting steps completed by patient: Adjust clothing prior to toileting, Adjust clothing after toileting Toileting steps completed by helper: Performs perineal hygiene Toileting Assistive Devices:  Grab bar or rail  Toileting assist Assist level: Touching or steadying assistance (Pt.75%)   Transfers Chair/bed transfer Chair/bed transfer activity did not occur: Safety/medical concerns Chair/bed transfer method: Stand pivot Chair/bed transfer assist level: Supervision or verbal cues Chair/bed transfer assistive device: Walker, Bedrails, Armrests     Locomotion Ambulation Ambulation activity did not occur: Safety/medical concerns (not medically cleared to ambulate)         Wheelchair Wheelchair activity did not occur: Safety/medical concerns Type: Manual Max wheelchair distance: 300 Assist Level: Supervision or verbal cues  Cognition Comprehension Comprehension assist level: Follows complex conversation/direction with no assist  Expression Expression assist level: Expresses complex ideas: With no assist  Social Interaction Social Interaction assist level: Interacts appropriately with others - No medications needed.  Problem Solving Problem solving assist level: Solves complex problems: Recognizes & self-corrects  Memory Memory assist level: Complete Independence: No helper   Medical Problem List and Plan: 1.  Multi pelvic fractures status post sacroiliac screw fixation secondary to trauma.   -CIR PT/OT continue--              -Touchdown weightbearing right leg to facilitate transfers.              -Weightbearing as tolerated left leg for transfers only 8 weeks,   -Patient to see me in the office for transitional care encounter in 1-2 weeks. 2.  DVT Prophylaxis/Anticoagulation: SQ Lovenox.  -vascular study normal 3. Pain Management: Neurontin 300 mg TID  -continue fentanyl patch at 29mcg   -  oxycodone prn/ with two scheduled doses to coincide with therapy  -much better control  -continue these meds at discharge 4. Acute blood loss anemia.  FeSo4 324 mg bid.    CBC Latest Ref Rng & Units 02/10/2016 01/30/2016 01/20/2016  WBC 4.0 - 10.5 K/uL - 7.8 10.1  Hemoglobin 13.0 -  17.0 g/dL 11.6(L) 9.2(L) 7.7(L)  Hematocrit 39.0 - 52.0 % 34.0(L) 30.5(L) 24.8(L)  Platelets 150 - 400 K/uL - 400 256   5. Neuropsych: This patient is capable of making decisions on his own behalf. 6. Skin/Wound Care/Sacral wound: air mattress.  -s/p I&D, placement of Acell 10/19 by Dr. Marla Roe.  -s/p repeat I&D, vac replaced  -homehealth/vac follow up arranged 7. Fluids/Electrolytes/Nutrition: encourage PO 8. Urethral trauma. Status post suprapubic catheter. Follow-up with changes as directed. Urine remains clear/yellow    -confirm with urology prior to discharge regarding plan 9. Diabetes mellitus with peripheral neuropathy. Hemoglobin A1c 7.9.  Levemir 15 units daily at bedtime  Glucophage 1000 mg twice a day  Glucotrol 10 mg before breakfast and 5 mg before supper  -reasonable control at present   CBG (last 3)   Recent Labs  02/11/16 1657 02/11/16 2049 02/12/16 0629  GLUCAP 121* 166* 117*   --follow up after dischrage  10. Hypertension. Norvasc increased to 10mg  on 10/22.  Vitals:   02/11/16 1356 02/12/16 0500  BP: (!) 110/50 122/65  Pulse: 85 84  Resp: 17 18  Temp: 97.9 F (36.6 C) 98.4 F (36.9 C)   11. Hyperlipidemia. Lipitor 12. Constipation    -working on more effective prophylactic program  -senokot s adjusted      LOS (Days) 26 A FACE TO FACE EVALUATION WAS PERFORMED  Azekiel Cremer T 02/12/2016 9:12 AM

## 2016-02-12 NOTE — Telephone Encounter (Signed)
[facility] has called because Christian [name] has brought in two Rx for immediate release oxycodone with different instructions from [facility].   They cannot fill both RX.  Please call [facility] [phone]) and advise what he should be taking.

## 2016-02-13 DIAGNOSIS — S32501D Unspecified fracture of right pubis, subsequent encounter for fracture with routine healing: Secondary | ICD-10-CM | POA: Diagnosis not present

## 2016-02-13 DIAGNOSIS — Z9359 Other cystostomy status: Secondary | ICD-10-CM | POA: Diagnosis not present

## 2016-02-13 DIAGNOSIS — E1142 Type 2 diabetes mellitus with diabetic polyneuropathy: Secondary | ICD-10-CM | POA: Diagnosis not present

## 2016-02-13 DIAGNOSIS — Z7984 Long term (current) use of oral hypoglycemic drugs: Secondary | ICD-10-CM | POA: Diagnosis not present

## 2016-02-13 DIAGNOSIS — Z794 Long term (current) use of insulin: Secondary | ICD-10-CM | POA: Diagnosis not present

## 2016-02-13 DIAGNOSIS — S32811D Multiple fractures of pelvis with unstable disruption of pelvic ring, subsequent encounter for fracture with routine healing: Secondary | ICD-10-CM | POA: Diagnosis not present

## 2016-02-13 DIAGNOSIS — D62 Acute posthemorrhagic anemia: Secondary | ICD-10-CM | POA: Diagnosis not present

## 2016-02-13 DIAGNOSIS — E785 Hyperlipidemia, unspecified: Secondary | ICD-10-CM | POA: Diagnosis not present

## 2016-02-13 DIAGNOSIS — I10 Essential (primary) hypertension: Secondary | ICD-10-CM | POA: Diagnosis not present

## 2016-02-13 DIAGNOSIS — S3730XD Unspecified injury of urethra, subsequent encounter: Secondary | ICD-10-CM | POA: Diagnosis not present

## 2016-02-13 DIAGNOSIS — L89153 Pressure ulcer of sacral region, stage 3: Secondary | ICD-10-CM | POA: Diagnosis not present

## 2016-02-14 ENCOUNTER — Telehealth: Payer: Self-pay | Admitting: *Deleted

## 2016-02-14 DIAGNOSIS — L89153 Pressure ulcer of sacral region, stage 3: Secondary | ICD-10-CM | POA: Diagnosis not present

## 2016-02-14 DIAGNOSIS — S32811D Multiple fractures of pelvis with unstable disruption of pelvic ring, subsequent encounter for fracture with routine healing: Secondary | ICD-10-CM | POA: Diagnosis not present

## 2016-02-14 DIAGNOSIS — S3730XD Unspecified injury of urethra, subsequent encounter: Secondary | ICD-10-CM | POA: Diagnosis not present

## 2016-02-14 DIAGNOSIS — D62 Acute posthemorrhagic anemia: Secondary | ICD-10-CM | POA: Diagnosis not present

## 2016-02-14 DIAGNOSIS — E1142 Type 2 diabetes mellitus with diabetic polyneuropathy: Secondary | ICD-10-CM | POA: Diagnosis not present

## 2016-02-14 DIAGNOSIS — S32501D Unspecified fracture of right pubis, subsequent encounter for fracture with routine healing: Secondary | ICD-10-CM | POA: Diagnosis not present

## 2016-02-14 NOTE — Telephone Encounter (Signed)
Contact made with Mr Mancine 1. Are you/is patient experiencing any problems since coming home? Are there any questions regarding any aspect of care?No 2. Are there any questions regarding medications administration/dosing? Are meds being taken as prescribed? Patient should review meds with caller to confirm They have meds and his wife is overseeing his schedule 3. Have there been any falls?Yes, this morning.  His feet "got tangled up in all his cords" and he slid down. EMT cam and assisted him back to bed. No injury 4. Has Home Health been to the house and/or have they contacted you? If not, have you tried to contact them? Can we help you contact them? Yes he has an appt for them to come out this afternoon 5. Are bowels and bladder emptying properly? Are there any unexpected incontinence issues? If applicable, is patient following bowel/bladder programs?No problems 6. Any fevers, problems with breathing, unexpected pain? No 7. Are there any skin problems or new areas of breakdown? NO, he has wound vac on for wound 8. Has the patient/family member arranged specialty MD follow up (ie cardiology/neurology/renal/surgical/etc)?  Can we help arrange? His wife is managing this, appointment given for Dr Naaman Plummer today 9. Does the patient need any other services or support that we can help arrange? No 10. Are caregivers following through as expected in assisting the patient? Yes 11. Has the patient quit smoking, drinking alcohol, or using drugs as recommended? N/A  Appointment with Dr Naaman Plummer 02/26/16 @3 :20  Arrive by 3:00 [place]

## 2016-02-15 DIAGNOSIS — D62 Acute posthemorrhagic anemia: Secondary | ICD-10-CM | POA: Diagnosis not present

## 2016-02-15 DIAGNOSIS — L89153 Pressure ulcer of sacral region, stage 3: Secondary | ICD-10-CM | POA: Diagnosis not present

## 2016-02-15 DIAGNOSIS — S32811D Multiple fractures of pelvis with unstable disruption of pelvic ring, subsequent encounter for fracture with routine healing: Secondary | ICD-10-CM | POA: Diagnosis not present

## 2016-02-15 DIAGNOSIS — S3730XD Unspecified injury of urethra, subsequent encounter: Secondary | ICD-10-CM | POA: Diagnosis not present

## 2016-02-15 DIAGNOSIS — S32501D Unspecified fracture of right pubis, subsequent encounter for fracture with routine healing: Secondary | ICD-10-CM | POA: Diagnosis not present

## 2016-02-15 DIAGNOSIS — E1142 Type 2 diabetes mellitus with diabetic polyneuropathy: Secondary | ICD-10-CM | POA: Diagnosis not present

## 2016-02-16 DIAGNOSIS — S32501D Unspecified fracture of right pubis, subsequent encounter for fracture with routine healing: Secondary | ICD-10-CM | POA: Diagnosis not present

## 2016-02-16 DIAGNOSIS — D62 Acute posthemorrhagic anemia: Secondary | ICD-10-CM | POA: Diagnosis not present

## 2016-02-16 DIAGNOSIS — E1142 Type 2 diabetes mellitus with diabetic polyneuropathy: Secondary | ICD-10-CM | POA: Diagnosis not present

## 2016-02-16 DIAGNOSIS — S32811D Multiple fractures of pelvis with unstable disruption of pelvic ring, subsequent encounter for fracture with routine healing: Secondary | ICD-10-CM | POA: Diagnosis not present

## 2016-02-16 DIAGNOSIS — L89153 Pressure ulcer of sacral region, stage 3: Secondary | ICD-10-CM | POA: Diagnosis not present

## 2016-02-16 DIAGNOSIS — S3730XD Unspecified injury of urethra, subsequent encounter: Secondary | ICD-10-CM | POA: Diagnosis not present

## 2016-02-17 ENCOUNTER — Telehealth: Payer: Self-pay | Admitting: Physical Medicine & Rehabilitation

## 2016-02-17 DIAGNOSIS — D62 Acute posthemorrhagic anemia: Secondary | ICD-10-CM | POA: Diagnosis not present

## 2016-02-17 DIAGNOSIS — E1142 Type 2 diabetes mellitus with diabetic polyneuropathy: Secondary | ICD-10-CM | POA: Diagnosis not present

## 2016-02-17 DIAGNOSIS — S32501D Unspecified fracture of right pubis, subsequent encounter for fracture with routine healing: Secondary | ICD-10-CM | POA: Diagnosis not present

## 2016-02-17 DIAGNOSIS — S32811D Multiple fractures of pelvis with unstable disruption of pelvic ring, subsequent encounter for fracture with routine healing: Secondary | ICD-10-CM | POA: Diagnosis not present

## 2016-02-17 DIAGNOSIS — L89153 Pressure ulcer of sacral region, stage 3: Secondary | ICD-10-CM | POA: Diagnosis not present

## 2016-02-17 DIAGNOSIS — S3730XD Unspecified injury of urethra, subsequent encounter: Secondary | ICD-10-CM | POA: Diagnosis not present

## 2016-02-17 NOTE — Telephone Encounter (Signed)
Left message for [name] that she will need to call plastic surgeon Dr Marla Roe about wound vac

## 2016-02-17 NOTE — Telephone Encounter (Signed)
advanced home care nurse has some questions about patients wound vac - please call [phone] to speak to [facility]

## 2016-02-18 DIAGNOSIS — E1142 Type 2 diabetes mellitus with diabetic polyneuropathy: Secondary | ICD-10-CM | POA: Diagnosis not present

## 2016-02-18 DIAGNOSIS — S3730XD Unspecified injury of urethra, subsequent encounter: Secondary | ICD-10-CM | POA: Diagnosis not present

## 2016-02-18 DIAGNOSIS — D62 Acute posthemorrhagic anemia: Secondary | ICD-10-CM | POA: Diagnosis not present

## 2016-02-18 DIAGNOSIS — L89153 Pressure ulcer of sacral region, stage 3: Secondary | ICD-10-CM | POA: Diagnosis not present

## 2016-02-18 DIAGNOSIS — S32501D Unspecified fracture of right pubis, subsequent encounter for fracture with routine healing: Secondary | ICD-10-CM | POA: Diagnosis not present

## 2016-02-18 DIAGNOSIS — S32811D Multiple fractures of pelvis with unstable disruption of pelvic ring, subsequent encounter for fracture with routine healing: Secondary | ICD-10-CM | POA: Diagnosis not present

## 2016-02-19 DIAGNOSIS — D62 Acute posthemorrhagic anemia: Secondary | ICD-10-CM | POA: Diagnosis not present

## 2016-02-19 DIAGNOSIS — S32811D Multiple fractures of pelvis with unstable disruption of pelvic ring, subsequent encounter for fracture with routine healing: Secondary | ICD-10-CM | POA: Diagnosis not present

## 2016-02-19 DIAGNOSIS — S3730XD Unspecified injury of urethra, subsequent encounter: Secondary | ICD-10-CM | POA: Diagnosis not present

## 2016-02-19 DIAGNOSIS — E1142 Type 2 diabetes mellitus with diabetic polyneuropathy: Secondary | ICD-10-CM | POA: Diagnosis not present

## 2016-02-19 DIAGNOSIS — S32501D Unspecified fracture of right pubis, subsequent encounter for fracture with routine healing: Secondary | ICD-10-CM | POA: Diagnosis not present

## 2016-02-19 DIAGNOSIS — L89153 Pressure ulcer of sacral region, stage 3: Secondary | ICD-10-CM | POA: Diagnosis not present

## 2016-02-20 DIAGNOSIS — S32501D Unspecified fracture of right pubis, subsequent encounter for fracture with routine healing: Secondary | ICD-10-CM | POA: Diagnosis not present

## 2016-02-20 DIAGNOSIS — D62 Acute posthemorrhagic anemia: Secondary | ICD-10-CM | POA: Diagnosis not present

## 2016-02-20 DIAGNOSIS — L89153 Pressure ulcer of sacral region, stage 3: Secondary | ICD-10-CM | POA: Diagnosis not present

## 2016-02-20 DIAGNOSIS — S32811D Multiple fractures of pelvis with unstable disruption of pelvic ring, subsequent encounter for fracture with routine healing: Secondary | ICD-10-CM | POA: Diagnosis not present

## 2016-02-20 DIAGNOSIS — S3730XD Unspecified injury of urethra, subsequent encounter: Secondary | ICD-10-CM | POA: Diagnosis not present

## 2016-02-20 DIAGNOSIS — E1142 Type 2 diabetes mellitus with diabetic polyneuropathy: Secondary | ICD-10-CM | POA: Diagnosis not present

## 2016-02-21 ENCOUNTER — Telehealth: Payer: Self-pay | Admitting: Physical Medicine & Rehabilitation

## 2016-02-21 DIAGNOSIS — S32501D Unspecified fracture of right pubis, subsequent encounter for fracture with routine healing: Secondary | ICD-10-CM | POA: Diagnosis not present

## 2016-02-21 DIAGNOSIS — S3730XD Unspecified injury of urethra, subsequent encounter: Secondary | ICD-10-CM | POA: Diagnosis not present

## 2016-02-21 DIAGNOSIS — S32811D Multiple fractures of pelvis with unstable disruption of pelvic ring, subsequent encounter for fracture with routine healing: Secondary | ICD-10-CM | POA: Diagnosis not present

## 2016-02-21 DIAGNOSIS — D62 Acute posthemorrhagic anemia: Secondary | ICD-10-CM | POA: Diagnosis not present

## 2016-02-21 DIAGNOSIS — L89153 Pressure ulcer of sacral region, stage 3: Secondary | ICD-10-CM | POA: Diagnosis not present

## 2016-02-21 DIAGNOSIS — E1142 Type 2 diabetes mellitus with diabetic polyneuropathy: Secondary | ICD-10-CM | POA: Diagnosis not present

## 2016-02-21 NOTE — Telephone Encounter (Signed)
Return [name] call, he was given a verbal order to extend [name] treatment for 3 additional visits. He verbalizes understanding.

## 2016-02-21 NOTE — Telephone Encounter (Signed)
[place] OT with [facility] needs to get verbal orders to extend patient 3 more visits.  Please call him at [phone].

## 2016-02-24 DIAGNOSIS — S32811D Multiple fractures of pelvis with unstable disruption of pelvic ring, subsequent encounter for fracture with routine healing: Secondary | ICD-10-CM | POA: Diagnosis not present

## 2016-02-24 DIAGNOSIS — L98423 Non-pressure chronic ulcer of back with necrosis of muscle: Secondary | ICD-10-CM | POA: Insufficient documentation

## 2016-02-24 DIAGNOSIS — S32501D Unspecified fracture of right pubis, subsequent encounter for fracture with routine healing: Secondary | ICD-10-CM | POA: Diagnosis not present

## 2016-02-24 DIAGNOSIS — S3730XD Unspecified injury of urethra, subsequent encounter: Secondary | ICD-10-CM | POA: Diagnosis not present

## 2016-02-24 DIAGNOSIS — L89150 Pressure ulcer of sacral region, unstageable: Secondary | ICD-10-CM | POA: Diagnosis not present

## 2016-02-24 DIAGNOSIS — L89153 Pressure ulcer of sacral region, stage 3: Secondary | ICD-10-CM | POA: Diagnosis not present

## 2016-02-25 DIAGNOSIS — S32501D Unspecified fracture of right pubis, subsequent encounter for fracture with routine healing: Secondary | ICD-10-CM | POA: Diagnosis not present

## 2016-02-25 DIAGNOSIS — D62 Acute posthemorrhagic anemia: Secondary | ICD-10-CM | POA: Diagnosis not present

## 2016-02-25 DIAGNOSIS — S32811D Multiple fractures of pelvis with unstable disruption of pelvic ring, subsequent encounter for fracture with routine healing: Secondary | ICD-10-CM | POA: Diagnosis not present

## 2016-02-25 DIAGNOSIS — L89153 Pressure ulcer of sacral region, stage 3: Secondary | ICD-10-CM | POA: Diagnosis not present

## 2016-02-25 DIAGNOSIS — E1142 Type 2 diabetes mellitus with diabetic polyneuropathy: Secondary | ICD-10-CM | POA: Diagnosis not present

## 2016-02-25 DIAGNOSIS — S3730XD Unspecified injury of urethra, subsequent encounter: Secondary | ICD-10-CM | POA: Diagnosis not present

## 2016-02-26 ENCOUNTER — Encounter: Payer: Medicare Other | Admitting: Physical Medicine & Rehabilitation

## 2016-02-26 DIAGNOSIS — D62 Acute posthemorrhagic anemia: Secondary | ICD-10-CM | POA: Diagnosis not present

## 2016-02-26 DIAGNOSIS — S32501D Unspecified fracture of right pubis, subsequent encounter for fracture with routine healing: Secondary | ICD-10-CM | POA: Diagnosis not present

## 2016-02-26 DIAGNOSIS — S3730XD Unspecified injury of urethra, subsequent encounter: Secondary | ICD-10-CM | POA: Diagnosis not present

## 2016-02-26 DIAGNOSIS — E1142 Type 2 diabetes mellitus with diabetic polyneuropathy: Secondary | ICD-10-CM | POA: Diagnosis not present

## 2016-02-26 DIAGNOSIS — L89153 Pressure ulcer of sacral region, stage 3: Secondary | ICD-10-CM | POA: Diagnosis not present

## 2016-02-26 DIAGNOSIS — S32811D Multiple fractures of pelvis with unstable disruption of pelvic ring, subsequent encounter for fracture with routine healing: Secondary | ICD-10-CM | POA: Diagnosis not present

## 2016-02-27 DIAGNOSIS — S32811D Multiple fractures of pelvis with unstable disruption of pelvic ring, subsequent encounter for fracture with routine healing: Secondary | ICD-10-CM | POA: Diagnosis not present

## 2016-02-27 DIAGNOSIS — D62 Acute posthemorrhagic anemia: Secondary | ICD-10-CM | POA: Diagnosis not present

## 2016-02-27 DIAGNOSIS — S3730XD Unspecified injury of urethra, subsequent encounter: Secondary | ICD-10-CM | POA: Diagnosis not present

## 2016-02-27 DIAGNOSIS — S32501D Unspecified fracture of right pubis, subsequent encounter for fracture with routine healing: Secondary | ICD-10-CM | POA: Diagnosis not present

## 2016-02-27 DIAGNOSIS — L89153 Pressure ulcer of sacral region, stage 3: Secondary | ICD-10-CM | POA: Diagnosis not present

## 2016-02-27 DIAGNOSIS — E1142 Type 2 diabetes mellitus with diabetic polyneuropathy: Secondary | ICD-10-CM | POA: Diagnosis not present

## 2016-02-28 DIAGNOSIS — S32811D Multiple fractures of pelvis with unstable disruption of pelvic ring, subsequent encounter for fracture with routine healing: Secondary | ICD-10-CM | POA: Diagnosis not present

## 2016-02-28 DIAGNOSIS — S3730XD Unspecified injury of urethra, subsequent encounter: Secondary | ICD-10-CM | POA: Diagnosis not present

## 2016-02-28 DIAGNOSIS — E1142 Type 2 diabetes mellitus with diabetic polyneuropathy: Secondary | ICD-10-CM | POA: Diagnosis not present

## 2016-02-28 DIAGNOSIS — D62 Acute posthemorrhagic anemia: Secondary | ICD-10-CM | POA: Diagnosis not present

## 2016-02-28 DIAGNOSIS — L89153 Pressure ulcer of sacral region, stage 3: Secondary | ICD-10-CM | POA: Diagnosis not present

## 2016-02-28 DIAGNOSIS — S32501D Unspecified fracture of right pubis, subsequent encounter for fracture with routine healing: Secondary | ICD-10-CM | POA: Diagnosis not present

## 2016-03-01 DIAGNOSIS — S32501D Unspecified fracture of right pubis, subsequent encounter for fracture with routine healing: Secondary | ICD-10-CM | POA: Diagnosis not present

## 2016-03-01 DIAGNOSIS — S32811D Multiple fractures of pelvis with unstable disruption of pelvic ring, subsequent encounter for fracture with routine healing: Secondary | ICD-10-CM | POA: Diagnosis not present

## 2016-03-01 DIAGNOSIS — E1142 Type 2 diabetes mellitus with diabetic polyneuropathy: Secondary | ICD-10-CM | POA: Diagnosis not present

## 2016-03-01 DIAGNOSIS — L89153 Pressure ulcer of sacral region, stage 3: Secondary | ICD-10-CM | POA: Diagnosis not present

## 2016-03-01 DIAGNOSIS — S3730XD Unspecified injury of urethra, subsequent encounter: Secondary | ICD-10-CM | POA: Diagnosis not present

## 2016-03-01 DIAGNOSIS — D62 Acute posthemorrhagic anemia: Secondary | ICD-10-CM | POA: Diagnosis not present

## 2016-03-02 DIAGNOSIS — S3730XD Unspecified injury of urethra, subsequent encounter: Secondary | ICD-10-CM | POA: Diagnosis not present

## 2016-03-02 DIAGNOSIS — S32501D Unspecified fracture of right pubis, subsequent encounter for fracture with routine healing: Secondary | ICD-10-CM | POA: Diagnosis not present

## 2016-03-02 DIAGNOSIS — D62 Acute posthemorrhagic anemia: Secondary | ICD-10-CM | POA: Diagnosis not present

## 2016-03-02 DIAGNOSIS — S32811D Multiple fractures of pelvis with unstable disruption of pelvic ring, subsequent encounter for fracture with routine healing: Secondary | ICD-10-CM | POA: Diagnosis not present

## 2016-03-02 DIAGNOSIS — E1142 Type 2 diabetes mellitus with diabetic polyneuropathy: Secondary | ICD-10-CM | POA: Diagnosis not present

## 2016-03-02 DIAGNOSIS — L89153 Pressure ulcer of sacral region, stage 3: Secondary | ICD-10-CM | POA: Diagnosis not present

## 2016-03-03 DIAGNOSIS — E1142 Type 2 diabetes mellitus with diabetic polyneuropathy: Secondary | ICD-10-CM | POA: Diagnosis not present

## 2016-03-03 DIAGNOSIS — S32811D Multiple fractures of pelvis with unstable disruption of pelvic ring, subsequent encounter for fracture with routine healing: Secondary | ICD-10-CM | POA: Diagnosis not present

## 2016-03-03 DIAGNOSIS — S3730XD Unspecified injury of urethra, subsequent encounter: Secondary | ICD-10-CM | POA: Diagnosis not present

## 2016-03-03 DIAGNOSIS — D62 Acute posthemorrhagic anemia: Secondary | ICD-10-CM | POA: Diagnosis not present

## 2016-03-03 DIAGNOSIS — S32501D Unspecified fracture of right pubis, subsequent encounter for fracture with routine healing: Secondary | ICD-10-CM | POA: Diagnosis not present

## 2016-03-03 DIAGNOSIS — L89153 Pressure ulcer of sacral region, stage 3: Secondary | ICD-10-CM | POA: Diagnosis not present

## 2016-03-04 DIAGNOSIS — L89153 Pressure ulcer of sacral region, stage 3: Secondary | ICD-10-CM | POA: Diagnosis not present

## 2016-03-04 DIAGNOSIS — D62 Acute posthemorrhagic anemia: Secondary | ICD-10-CM | POA: Diagnosis not present

## 2016-03-04 DIAGNOSIS — S32811D Multiple fractures of pelvis with unstable disruption of pelvic ring, subsequent encounter for fracture with routine healing: Secondary | ICD-10-CM | POA: Diagnosis not present

## 2016-03-04 DIAGNOSIS — S3730XD Unspecified injury of urethra, subsequent encounter: Secondary | ICD-10-CM | POA: Diagnosis not present

## 2016-03-04 DIAGNOSIS — E1142 Type 2 diabetes mellitus with diabetic polyneuropathy: Secondary | ICD-10-CM | POA: Diagnosis not present

## 2016-03-04 DIAGNOSIS — S32501D Unspecified fracture of right pubis, subsequent encounter for fracture with routine healing: Secondary | ICD-10-CM | POA: Diagnosis not present

## 2016-03-05 ENCOUNTER — Encounter (HOSPITAL_COMMUNITY): Payer: Self-pay | Admitting: *Deleted

## 2016-03-05 ENCOUNTER — Observation Stay (HOSPITAL_COMMUNITY)
Admission: EM | Admit: 2016-03-05 | Discharge: 2016-03-06 | Disposition: A | Discharge status: Home / Self Care | Payer: Medicare Other | Attending: Internal Medicine | Admitting: Internal Medicine

## 2016-03-05 DIAGNOSIS — E119 Type 2 diabetes mellitus without complications: Secondary | ICD-10-CM | POA: Insufficient documentation

## 2016-03-05 DIAGNOSIS — I10 Essential (primary) hypertension: Secondary | ICD-10-CM | POA: Diagnosis not present

## 2016-03-05 DIAGNOSIS — S3730XD Unspecified injury of urethra, subsequent encounter: Secondary | ICD-10-CM

## 2016-03-05 DIAGNOSIS — N35014 Post-traumatic urethral stricture, male, unspecified: Secondary | ICD-10-CM

## 2016-03-05 DIAGNOSIS — K529 Noninfective gastroenteritis and colitis, unspecified: Secondary | ICD-10-CM | POA: Insufficient documentation

## 2016-03-05 DIAGNOSIS — E1142 Type 2 diabetes mellitus with diabetic polyneuropathy: Secondary | ICD-10-CM | POA: Diagnosis present

## 2016-03-05 DIAGNOSIS — E860 Dehydration: Secondary | ICD-10-CM | POA: Diagnosis not present

## 2016-03-05 DIAGNOSIS — Z79899 Other long term (current) drug therapy: Secondary | ICD-10-CM | POA: Diagnosis not present

## 2016-03-05 DIAGNOSIS — L8990 Pressure ulcer of unspecified site, unspecified stage: Secondary | ICD-10-CM | POA: Insufficient documentation

## 2016-03-05 DIAGNOSIS — Z794 Long term (current) use of insulin: Secondary | ICD-10-CM | POA: Diagnosis not present

## 2016-03-05 DIAGNOSIS — N179 Acute kidney failure, unspecified: Secondary | ICD-10-CM | POA: Diagnosis not present

## 2016-03-05 DIAGNOSIS — R339 Retention of urine, unspecified: Secondary | ICD-10-CM | POA: Diagnosis present

## 2016-03-05 DIAGNOSIS — S3730XA Unspecified injury of urethra, initial encounter: Secondary | ICD-10-CM | POA: Diagnosis present

## 2016-03-05 LAB — CBC
HCT: 39.8 % (ref 39.0–52.0)
Hemoglobin: 12.7 g/dL — ABNORMAL LOW (ref 13.0–17.0)
MCH: 27.6 pg (ref 26.0–34.0)
MCHC: 31.9 g/dL (ref 30.0–36.0)
MCV: 86.5 fL (ref 78.0–100.0)
Platelets: 393 10*3/uL (ref 150–400)
RBC: 4.6 MIL/uL (ref 4.22–5.81)
RDW: 14.9 % (ref 11.5–15.5)
WBC: 10.8 10*3/uL — ABNORMAL HIGH (ref 4.0–10.5)

## 2016-03-05 LAB — COMPREHENSIVE METABOLIC PANEL
ALBUMIN: 3.2 g/dL — AB (ref 3.5–5.0)
ALK PHOS: 121 U/L (ref 38–126)
ALT: 18 U/L (ref 17–63)
AST: 28 U/L (ref 15–41)
Anion gap: 14 (ref 5–15)
BILIRUBIN TOTAL: 0.3 mg/dL (ref 0.3–1.2)
BUN: 28 mg/dL — AB (ref 6–20)
CALCIUM: 9.3 mg/dL (ref 8.9–10.3)
CO2: 23 mmol/L (ref 22–32)
Chloride: 98 mmol/L — ABNORMAL LOW (ref 101–111)
Creatinine, Ser: 1.5 mg/dL — ABNORMAL HIGH (ref 0.61–1.24)
GFR calc Af Amer: 51 mL/min — ABNORMAL LOW (ref >60)
GFR calc non Af Amer: 44 mL/min — ABNORMAL LOW (ref >60)
GLUCOSE: 324 mg/dL — AB (ref 65–99)
Potassium: 4.6 mmol/L (ref 3.5–5.1)
Sodium: 135 mmol/L (ref 135–145)
TOTAL PROTEIN: 7.8 g/dL (ref 6.5–8.1)

## 2016-03-05 LAB — URINE MICROSCOPIC-ADD ON

## 2016-03-05 LAB — URINALYSIS, ROUTINE W REFLEX MICROSCOPIC
BILIRUBIN URINE: NEGATIVE
Glucose, UA: NEGATIVE mg/dL
KETONES UR: NEGATIVE mg/dL
NITRITE: POSITIVE — AB
Protein, ur: 30 mg/dL — AB
Specific Gravity, Urine: 1.019 (ref 1.005–1.030)
pH: 5 (ref 5.0–8.0)

## 2016-03-05 LAB — GLUCOSE, CAPILLARY: Glucose-Capillary: 85 mg/dL (ref 65–99)

## 2016-03-05 LAB — LIPASE, BLOOD: Lipase: 35 U/L (ref 11–51)

## 2016-03-05 MED ORDER — ONDANSETRON HCL 4 MG/2ML IJ SOLN: 4.0000 mg | Freq: Four times a day (QID) | INTRAMUSCULAR | Status: DC | PRN

## 2016-03-05 MED ORDER — SODIUM CHLORIDE 0.9 % IV SOLN
INTRAVENOUS | Status: DC
  Administered 2016-03-06: via INTRAVENOUS

## 2016-03-05 MED ORDER — INSULIN DETEMIR 100 UNIT/ML ~~LOC~~ SOLN
10.0000 [IU] | Freq: Every day | SUBCUTANEOUS | Status: DC
  Filled 2016-03-05 (×2): qty 0.1

## 2016-03-05 MED ORDER — OXYCODONE-ACETAMINOPHEN 5-325 MG PO TABS
ORAL_TABLET | ORAL | Status: AC
  Filled 2016-03-05: qty 1

## 2016-03-05 MED ORDER — ONDANSETRON 4 MG PO TBDP
4.0000 mg | ORAL_TABLET | Freq: Once | ORAL | Status: AC | PRN
  Administered 2016-03-05: 4 mg via ORAL

## 2016-03-05 MED ORDER — ONDANSETRON HCL 4 MG PO TABS: 4.0000 mg | ORAL_TABLET | Freq: Four times a day (QID) | ORAL | Status: DC | PRN

## 2016-03-05 MED ORDER — SODIUM CHLORIDE 0.9 % IV BOLUS (SEPSIS)
1000.0000 mL | Freq: Once | INTRAVENOUS | Status: AC
  Administered 2016-03-05: 1000 mL via INTRAVENOUS

## 2016-03-05 MED ORDER — GABAPENTIN 300 MG PO CAPS
300.0000 mg | ORAL_CAPSULE | Freq: Every day | ORAL | Status: DC
  Administered 2016-03-06: 300 mg via ORAL
  Filled 2016-03-05: qty 1

## 2016-03-05 MED ORDER — SODIUM CHLORIDE 0.9% FLUSH: 3.0000 mL | Freq: Two times a day (BID) | INTRAVENOUS | Status: DC

## 2016-03-05 MED ORDER — ONDANSETRON 4 MG PO TBDP
ORAL_TABLET | ORAL | Status: AC
  Filled 2016-03-05: qty 1

## 2016-03-05 MED ORDER — TRAMADOL HCL 50 MG PO TABS: 50.0000 mg | ORAL_TABLET | Freq: Two times a day (BID) | ORAL | Status: DC | PRN

## 2016-03-05 MED ORDER — ACETAMINOPHEN 650 MG RE SUPP: 650.0000 mg | Freq: Four times a day (QID) | RECTAL | Status: DC | PRN

## 2016-03-05 MED ORDER — OXYCODONE-ACETAMINOPHEN 5-325 MG PO TABS
1.0000 | ORAL_TABLET | ORAL | Status: AC | PRN
  Administered 2016-03-05 – 2016-03-06 (×2): 1 via ORAL
  Filled 2016-03-05: qty 1

## 2016-03-05 MED ORDER — ENOXAPARIN SODIUM 40 MG/0.4ML ~~LOC~~ SOLN
40.0000 mg | Freq: Every day | SUBCUTANEOUS | Status: DC
  Administered 2016-03-06: 40 mg via SUBCUTANEOUS
  Filled 2016-03-05: qty 0.4

## 2016-03-05 MED ORDER — ACETAMINOPHEN 325 MG PO TABS
650.0000 mg | ORAL_TABLET | Freq: Four times a day (QID) | ORAL | Status: DC | PRN
Start: 2016-03-05 — End: 2016-03-06

## 2016-03-05 MED ORDER — INSULIN ASPART 100 UNIT/ML ~~LOC~~ SOLN
0.0000 [IU] | SUBCUTANEOUS | Status: DC
  Administered 2016-03-06: 2 [IU] via SUBCUTANEOUS
  Administered 2016-03-06: 1 [IU] via SUBCUTANEOUS

## 2016-03-05 MED ORDER — ATORVASTATIN CALCIUM 80 MG PO TABS: 80.0000 mg | ORAL_TABLET | Freq: Every day | ORAL | Status: DC

## 2016-03-05 NOTE — H&P (Addendum)
[name] [id number] DOB: December 06, 1940 DOA: 03/05/2016     PCP: Jene Every, MD   Outpatient Specialists:  [facility] Patient coming from:  home Lives With family    Chief Complaint: nausea vomiting decreased urine output  HPI: Christian [name] is a 75 y.o. male with medical history significant of Multiple pelvic fractures status post sacroiliac screw fixation secondary to recent trauma, urethral trauma status post suprapubic catheter, DM 2, HTN, sacral wound, constipation, kidney stone, chronic pain     Presented with 2 day hx of diarrhea nausea and vomiting associated with decreased urine output with Kwan urine. He has been attempting to drink fluids but was not able to keep up. He was nauseated and unable to eat well. Diarrhea has been significant 3-4 times a day no blood in stool no antibiotic exposure, no travel.   Patient feels like he has gotten GI bug from his friend. Denies lightlessness, no chest pain no SOB no fever.    Regarding pertinent Chronic problems: Patient had an accident on 01/10/2016. The tractor slipped into gear and pinned him against pole he had significant pelvic fractures and urethral injury requiring a suprapubic catheter placement Underwent transsacral screw x2 for pelvic fracture. He developed sacral Ulcer plastic surgery has been consulted He had wound vac placed a few times. Patient was admitted to rehabilitation service and discharged in 02/12/2016 to home. His family has been taking care of his wound but home health has been checking on it.      IN ER:  Temp (24hrs), Avg:98.3 F (36.8 C), Min:97.7 F (36.5 C), Max:98.9 F (37.2 C)     Hr 88 BP 138/72  WBC 10.8 Hg 12.7   Cr 1.5 alb 3.2  bladder scanned, <63mL of urine    Following Medications were ordered in ER: Medications  oxyCODONE-acetaminophen (PERCOCET/ROXICET) 5-325 MG per tablet 1 tablet (1 tablet Oral Given 03/05/16 1730)  oxyCODONE-acetaminophen (PERCOCET/ROXICET) 5-325  MG per tablet (not administered)  ondansetron (ZOFRAN-ODT) disintegrating tablet 4 mg (4 mg Oral Given 03/05/16 1730)  sodium chloride 0.9 % bolus 1,000 mL (1,000 mLs Intravenous New Bag/Given 03/05/16 2132)     Hospitalist was called for admission for AKI and dehydration  Review of Systems:    Pertinent positives include: abdominal pain, nausea, vomiting, diarrhea, change in color of urine,   Constitutional:  No weight loss, night sweats, Fevers, chills, fatigue, weight loss  HEENT:  No headaches, Difficulty swallowing,Tooth/dental problems,Sore throat,  No sneezing, itching, ear ache, nasal congestion, post nasal drip,  Cardio-vascular:  No chest pain, Orthopnea, PND, anasarca, dizziness, palpitations.no Bilateral lower extremity swelling  GI:  No heartburn, indigestion,  change in bowel habits, loss of appetite, melena, blood in stool, hematemesis Resp:  no shortness of breath at rest. No dyspnea on exertion, No excess mucus, no productive cough, No non-productive cough, No coughing up of blood.No change in color of mucus.No wheezing. Skin:  no rash or lesions. No jaundice GU:  no dysuria, no urgency or frequency. No straining to urinate.  No flank pain.  Musculoskeletal:  No joint pain or no joint swelling. No decreased range of motion. No back pain.  Psych:  No change in mood or affect. No depression or anxiety. No memory loss.  Neuro: no localizing neurological complaints, no tingling, no weakness, no double vision, no gait abnormality, no slurred speech, no confusion  As per HPI otherwise 10 point review of systems negative.   Past Medical History: Past Medical History:  Diagnosis  Date  . Diabetes mellitus without complication ([place])   . Hyperlipidemia   . Hypertension    Past Surgical History:  Procedure Laterality Date  . ACHILLES TENDON SURGERY Right   . APPLICATION OF A-CELL OF BACK N/A 02/10/2016   Procedure: APPLICATION OF A-CELL OF sacral wound;  Surgeon:  Loel Lofty Dillingham, DO;  Location: [place];  Service: Plastics;  Laterality: N/A;  . APPLICATION OF WOUND VAC N/A 02/10/2016   Procedure: APPLICATION OF WOUND VAC possible;  Surgeon: Loel Lofty Dillingham, DO;  Location: [facility];  Service: Plastics;  Laterality: N/A;  . I&D EXTREMITY N/A 01/30/2016   Procedure: IRRIGATION AND DEBRIDEMENT OF THE SACRUM;  Surgeon: Loel Lofty Dillingham, DO;  Location: [facility];  Service: Plastics;  Laterality: N/A;  . INCISION AND DRAINAGE OF WOUND N/A 02/10/2016   Procedure: IRRIGATION AND DEBRIDEMENT sacral WOUND;  Surgeon: Loel Lofty Dillingham, DO;  Location: [facility];  Service: Plastics;  Laterality: N/A;  . IR GENERIC HISTORICAL  02/11/2016   IR CATHETER TUBE CHANGE 02/11/2016 Aletta Edouard, MD [facility]INTER[facility]  . SACRO-ILIAC PINNING N/A 01/16/2016   Procedure: Jenetta Downer;  Surgeon: Altamese [place] Williston, MD;  Location: [place] [facility];  Service: Orthopedics;  Laterality: N/A;  . TONSILLECTOMY    . ULNAR NERVE TRANSPOSITION Right      Social History:  Ambulatory   Walker or  wheelchair bound     reports that he has never smoked. He has never used smokeless tobacco. He reports that he does not drink alcohol. His drug history is not on file.  Allergies:   Allergies  Allergen Reactions  . No Known Allergies        Family History:   Family History  Problem Relation Age of Onset  . Pancreatic cancer Mother   . CVA Father   . Dementia Father   . Diabetes Neg Hx     Medications: Prior to Admission medications   Medication Sig Start Date End Date Taking? Authorizing Provider  amLODipine (NORVASC) 10 MG tablet Take 1 tablet (10 mg total) by mouth daily. 02/12/16  Yes Daniel J Angiulli, PA-C  atorvastatin (LIPITOR) 80 MG tablet Take 1 tablet (80 mg total) by mouth daily. Patient taking differently: Take 80 mg by mouth at bedtime.  02/12/16  Yes Daniel J Angiulli, PA-C  calcium carbonate (OS-CAL - DOSED IN MG OF ELEMENTAL CALCIUM) 1250 (500 Ca) MG tablet Take 1  tablet (500 mg of elemental calcium total) by mouth 3 (three) times daily with meals. 02/12/16  Yes Daniel J Angiulli, PA-C  ferrous gluconate (FERGON) 324 MG tablet Take 1 tablet (324 mg total) by mouth 2 (two) times daily with a meal. 02/12/16  Yes Daniel J Angiulli, PA-C  fluticasone (FLONASE) 50 MCG/ACT nasal spray Place 2 sprays into both nostrils daily as needed for allergies or rhinitis.    Yes Historical Provider, MD  gabapentin (NEURONTIN) 300 MG capsule Take 1 capsule (300 mg total) by mouth 3 (three) times daily. Patient taking differently: Take 300 mg by mouth daily.  02/12/16  Yes Daniel J Angiulli, PA-C  glipiZIDE (GLUCOTROL) 5 MG tablet Take 5-10 mg by mouth See admin instructions. Take 2 tablets (10 mg) by mouth every morning and 1 tablet (5 mg) at night 01/10/16  Yes Historical Provider, MD  insulin aspart (NOVOLOG) 100 UNIT/ML injection Inject 4 Units into the skin 3 (three) times daily with meals. 02/12/16  Yes Daniel J Angiulli, PA-C  insulin detemir (LEVEMIR) 100 UNIT/ML injection Inject 0.15 mLs (15  Units total) into the skin at bedtime. 02/12/16  Yes Daniel J Angiulli, PA-C  metFORMIN (GLUCOPHAGE) 1000 MG tablet Take 1 tablet (1,000 mg total) by mouth 2 (two) times daily. 02/12/16  Yes Daniel J Angiulli, PA-C  Multiple Vitamin (MULTIVITAMIN WITH MINERALS) TABS tablet Take 1 tablet by mouth daily.   Yes Historical Provider, MD  OVER THE COUNTER MEDICATION Apply topically See admin instructions. Mix 1 quart water with 1 tablespoon plus 2 teaspoons clorox and use to cleanse wound on back twice daily   Yes Historical Provider, MD  pantoprazole (PROTONIX) 40 MG tablet Take 1 tablet (40 mg total) by mouth 2 (two) times daily. 02/12/16  Yes Daniel J Angiulli, PA-C  senna-docusate (SENOKOT-S) 8.6-50 MG tablet Take 2 tablets by mouth 2 (two) times daily. Patient taking differently: Take 2 tablets by mouth 2 (two) times daily as needed (constipation).  02/12/16  Yes Daniel J Angiulli, PA-C    traMADol (ULTRAM) 50 MG tablet Take 50 mg by mouth every 12 (twelve) hours as needed (pain).   Yes Historical Provider, MD  vitamin C (VITAMIN C) 500 MG tablet Take 1 tablet (500 mg total) by mouth 2 (two) times daily. 02/12/16  Yes Daniel J Angiulli, PA-C  zinc sulfate 220 (50 Zn) MG capsule Take 1 capsule (220 mg total) by mouth daily. 02/12/16  Yes Daniel J Angiulli, PA-C  fentaNYL (DURAGESIC - DOSED MCG/HR) 50 MCG/HR Place 1 patch (50 mcg total) onto the skin every 3 (three) days. Patient not taking: Reported on 03/05/2016 02/14/16   Lavon Paganini Angiulli, PA-C  Multiple Vitamins-Iron (MULTIVITAMINS WITH IRON) TABS tablet Take 1 tablet by mouth daily. Patient not taking: Reported on 03/05/2016 02/12/16   Lavon Paganini Angiulli, PA-C  oxyCODONE 10 MG TABS Take 1 tablet (10 mg total) by mouth 2 (two) times daily. Patient not taking: Reported on 03/05/2016 02/12/16   Lavon Paganini Angiulli, PA-C  oxyCODONE 10 MG TABS Take 1 tablet (10 mg total) by mouth every 6 (six) hours as needed for moderate pain or severe pain. Patient not taking: Reported on 03/05/2016 02/12/16   Cathlyn Parsons, PA-C    Physical Exam: Patient Vitals for the past 24 hrs:  BP Temp Temp src Pulse Resp SpO2 Height Weight  03/05/16 2145 123/77 - - 90 - 96 % - -  03/05/16 2115 110/85 - - 97 - 93 % - -  03/05/16 2100 117/66 - - 93 - 96 % - -  03/05/16 2036 138/71 98.9 F (37.2 C) Oral 94 18 96 % - -  03/05/16 1856 (!) 108/33 98.2 F (36.8 C) Oral 99 16 97 % - -  03/05/16 1719 - - - - - - 6\' 2"  (1.88 m) 86.2 kg (190 lb)  03/05/16 1718 117/87 97.7 F (36.5 C) Oral 108 16 99 % - -    1. General:  in No Acute distress 2. Psychological: Alert and  Oriented 3. Head/ENT:   Dry Mucous Membranes                          Head Non traumatic, neck supple                           Poor Dentition 4. SKIN:    decreased Skin turgor,  Skin clean DryEvidence of sacral wound with good granulation tissue no evidence of infection at this point 5.  Heart: Regular rate and rhythm no  Murmur, Rub or gallop 6. Lungs:  Clear to auscultation bilaterally, no wheezes or crackles   7. Abdomen: Soft,   non-tender, Non distended, suprapubic catheter in place 8. Lower extremities: no clubbing, cyanosis, or edema 9. Neurologically Grossly intact, moving all 4 extremities equally 10. MSK: Normal range of motion   body mass index is 24.39 kg/m.  Labs on Admission:   Labs on Admission: I have personally reviewed following labs and imaging studies  CBC:  Recent Labs Lab 03/05/16 1743  WBC 10.8*  HGB 12.7*  HCT 39.8  MCV 86.5  PLT [id number]   Basic Metabolic Panel:  Recent Labs Lab 03/05/16 1743  NA 135  K 4.6  CL 98*  CO2 23  GLUCOSE 324*  BUN 28*  CREATININE 1.50*  CALCIUM 9.3   GFR: Estimated Creatinine Clearance: 50.2 mL/min (by C-G formula based on SCr of 1.5 mg/dL (H)). Liver Function Tests:  Recent Labs Lab 03/05/16 1743  AST 28  ALT 18  ALKPHOS 121  BILITOT 0.3  PROT 7.8  ALBUMIN 3.2*    Recent Labs Lab 03/05/16 1743  LIPASE 35   No results for input(s): AMMONIA in the last 168 hours. Coagulation Profile: No results for input(s): INR, PROTIME in the last 168 hours. Cardiac Enzymes: No results for input(s): CKTOTAL, CKMB, CKMBINDEX, TROPONINI in the last 168 hours. BNP (last 3 results) No results for input(s): PROBNP in the last 8760 hours. HbA1C: No results for input(s): HGBA1C in the last 72 hours. CBG: No results for input(s): GLUCAP in the last 168 hours. Lipid Profile: No results for input(s): CHOL, HDL, LDLCALC, TRIG, CHOLHDL, LDLDIRECT in the last 72 hours. Thyroid Function Tests: No results for input(s): TSH, T4TOTAL, FREET4, T3FREE, THYROIDAB in the last 72 hours. Anemia Panel: No results for input(s): VITAMINB12, FOLATE, FERRITIN, TIBC, IRON, RETICCTPCT in the last 72 hours.  Sepsis Labs: @LABRCNTIP (procalcitonin:4,lacticidven:4) )No results found for this or any previous visit (from the  past 240 hour(s)).     UA  ordered  Lab Results  Component Value Date   HGBA1C 7.9 (H) 01/11/2016    Estimated Creatinine Clearance: 50.2 mL/min (by C-G formula based on SCr of 1.5 mg/dL (H)).  BNP (last 3 results) No results for input(s): PROBNP in the last 8760 hours.   ECG REPORT Not obtained  Filed Weights   03/05/16 1719  Weight: 86.2 kg (190 lb)     Cultures: No results found for: SDES, SPECREQUEST, CULT, REPTSTATUS   Radiological Exams on Admission: No results found.  Chart has been reviewed    Assessment/Plan   75 y.o. male with medical history significant of Multiple pelvic fractures status post sacroiliac screw fixation secondary to recent trauma, urethral trauma status post suprapubic catheter, DM 2, HTN, sacral wound, constipation being admitted for dehydration, AKI due to gastroenteritis    Present on Admission: . Acute kidney injury ([place]) likely secondary dehydration will rehydrate and continue to follow obtain urine electrolytes . Benign essential HTN given somewhat soft blood pressures hold Norvasc for tonight . DM type 2 with diabetic peripheral neuropathy ([place]) hold by mouth medications decreased dose of long-standing insulin while 4 by mouth intake . Gastroenteritis treat supportively obtain stool cultures . Urethral injury -status post suprapubic catheter will continue . Dehydration - - we'll administer IV fluids and recheck Cr in AM    Other plan as per orders.  DVT prophylaxis:  Lovenox     Code Status:  FULL CODE  as per patient  Family Communication:   Family  at  Bedside  plan of care was discussed with  Son,  Wife,  Disposition Plan:     To home once workup is complete and patient is stable                        Has  PT/OT at home through [facility] called: None   Admission status:   obs   Level of care   tele            I have spent a total of 56 min on this admission   Marijke Guadiana 03/05/2016,  11:05 PM    [facility]  Pager [phone]   after 2 AM please page floor coverage PA If 7AM-7PM, please contact the day team taking care of the patient  [email or web address]  Password [email or web address]

## 2016-03-05 NOTE — ED Notes (Signed)
Urinary drainage bag exchanged with new one; pts existing suprapubic catheter remains

## 2016-03-05 NOTE — ED Provider Notes (Signed)
[facility] DEPT Provider Note   CSN: [id number] Arrival date & time: 03/05/16  1654     History   Chief Complaint Chief Complaint  Patient presents with  . Urinary Retention  . Abdominal Pain    HPI Christian [name] is a 75 y.o. male.   Illness  This is a new problem. The current episode started more than 2 days ago. The problem occurs constantly. The problem has not changed since onset.Associated symptoms include abdominal pain. Pertinent negatives include no chest pain and no shortness of breath. Associated symptoms comments: N/V/D. Nothing aggravates the symptoms. Nothing relieves the symptoms. He has tried nothing for the symptoms. The treatment provided no relief.    Past Medical History:  Diagnosis Date  . Diabetes mellitus without complication ([place])   . Hyperlipidemia   . Hypertension     Patient Active Problem List   Diagnosis Date Noted  . Gastroenteritis 03/05/2016  . Dehydration 03/05/2016  . DM type 2 with diabetic peripheral neuropathy ([place])   . Constipation due to pain medication   . Benign essential HTN   . Post-operative pain   . Sacral decubitus ulcer 01/17/2016  . Accident caused by farm tractor 01/15/2016  . Urethral injury 01/15/2016  . Acute blood loss anemia 01/15/2016  . Acute kidney injury ([place]) 01/15/2016  . Multiple pelvic fractures ([place]) 01/10/2016    Past Surgical History:  Procedure Laterality Date  . ACHILLES TENDON SURGERY Right   . APPLICATION OF A-CELL OF BACK N/A 02/10/2016   Procedure: APPLICATION OF A-CELL OF sacral wound;  Surgeon: Loel Lofty Dillingham, DO;  Location: [facility];  Service: Plastics;  Laterality: N/A;  . APPLICATION OF WOUND VAC N/A 02/10/2016   Procedure: APPLICATION OF WOUND VAC possible;  Surgeon: Loel Lofty Dillingham, DO;  Location: [facility];  Service: Plastics;  Laterality: N/A;  . I&D EXTREMITY N/A 01/30/2016   Procedure: IRRIGATION AND DEBRIDEMENT OF THE SACRUM;  Surgeon: Loel Lofty Dillingham, DO;  Location: [facility];  Service: Plastics;  Laterality: N/A;  . INCISION AND DRAINAGE OF WOUND N/A 02/10/2016   Procedure: IRRIGATION AND DEBRIDEMENT sacral WOUND;  Surgeon: Loel Lofty Dillingham, DO;  Location: [facility];  Service: Plastics;  Laterality: N/A;  . IR GENERIC HISTORICAL  02/11/2016   IR CATHETER TUBE CHANGE 02/11/2016 Aletta Edouard, MD [facility]-INTERV RAD  . SACRO-ILIAC PINNING N/A 01/16/2016   Procedure: Jenetta Downer;  Surgeon: Altamese Little Cedar, MD;  Location: [facility];  Service: Orthopedics;  Laterality: N/A;  . TONSILLECTOMY    . ULNAR NERVE TRANSPOSITION Right        Home Medications    Prior to Admission medications   Medication Sig Start Date End Date Taking? Authorizing Provider  amLODipine (NORVASC) 10 MG tablet Take 1 tablet (10 mg total) by mouth daily. 02/12/16  Yes Daniel J Angiulli, PA-C  atorvastatin (LIPITOR) 80 MG tablet Take 1 tablet (80 mg total) by mouth daily. Patient taking differently: Take 80 mg by mouth at bedtime.  02/12/16  Yes Daniel J Angiulli, PA-C  calcium carbonate (OS-CAL - DOSED IN MG OF ELEMENTAL CALCIUM) 1250 (500 Ca) MG tablet Take 1 tablet (500 mg of elemental calcium total) by mouth 3 (three) times daily with meals. 02/12/16  Yes Daniel J Angiulli, PA-C  ferrous gluconate (FERGON) 324 MG tablet Take 1 tablet (324 mg total) by mouth 2 (two) times daily with a meal. 02/12/16  Yes Daniel J Angiulli, PA-C  fluticasone (FLONASE) 50 MCG/ACT nasal spray Place 2 sprays into both  nostrils daily as needed for allergies or rhinitis.    Yes Historical Provider, MD  gabapentin (NEURONTIN) 300 MG capsule Take 1 capsule (300 mg total) by mouth 3 (three) times daily. Patient taking differently: Take 300 mg by mouth daily.  02/12/16  Yes Daniel J Angiulli, PA-C  glipiZIDE (GLUCOTROL) 5 MG tablet Take 5-10 mg by mouth See admin instructions. Take 2 tablets (10 mg) by mouth every morning and 1 tablet (5 mg) at night 01/10/16  Yes Historical Provider, MD  insulin aspart (NOVOLOG) 100  UNIT/ML injection Inject 4 Units into the skin 3 (three) times daily with meals. 02/12/16  Yes Daniel J Angiulli, PA-C  insulin detemir (LEVEMIR) 100 UNIT/ML injection Inject 0.15 mLs (15 Units total) into the skin at bedtime. 02/12/16  Yes Daniel J Angiulli, PA-C  metFORMIN (GLUCOPHAGE) 1000 MG tablet Take 1 tablet (1,000 mg total) by mouth 2 (two) times daily. 02/12/16  Yes Daniel J Angiulli, PA-C  Multiple Vitamin (MULTIVITAMIN WITH MINERALS) TABS tablet Take 1 tablet by mouth daily.   Yes Historical Provider, MD  OVER THE COUNTER MEDICATION Apply topically See admin instructions. Mix 1 quart water with 1 tablespoon plus 2 teaspoons clorox and use to cleanse wound on back twice daily   Yes Historical Provider, MD  pantoprazole (PROTONIX) 40 MG tablet Take 1 tablet (40 mg total) by mouth 2 (two) times daily. 02/12/16  Yes Daniel J Angiulli, PA-C  senna-docusate (SENOKOT-S) 8.6-50 MG tablet Take 2 tablets by mouth 2 (two) times daily. Patient taking differently: Take 2 tablets by mouth 2 (two) times daily as needed (constipation).  02/12/16  Yes Daniel J Angiulli, PA-C  traMADol (ULTRAM) 50 MG tablet Take 50 mg by mouth every 12 (twelve) hours as needed (pain).   Yes Historical Provider, MD  vitamin C (VITAMIN C) 500 MG tablet Take 1 tablet (500 mg total) by mouth 2 (two) times daily. 02/12/16  Yes Daniel J Angiulli, PA-C  zinc sulfate 220 (50 Zn) MG capsule Take 1 capsule (220 mg total) by mouth daily. 02/12/16  Yes Daniel J Angiulli, PA-C  fentaNYL (DURAGESIC - DOSED MCG/HR) 50 MCG/HR Place 1 patch (50 mcg total) onto the skin every 3 (three) days. Patient not taking: Reported on 03/05/2016 02/14/16   Lavon Paganini Angiulli, PA-C  Multiple Vitamins-Iron (MULTIVITAMINS WITH IRON) TABS tablet Take 1 tablet by mouth daily. Patient not taking: Reported on 03/05/2016 02/12/16   Lavon Paganini Angiulli, PA-C  oxyCODONE 10 MG TABS Take 1 tablet (10 mg total) by mouth 2 (two) times daily. Patient not taking: Reported on  03/05/2016 02/12/16   Lavon Paganini Angiulli, PA-C  oxyCODONE 10 MG TABS Take 1 tablet (10 mg total) by mouth every 6 (six) hours as needed for moderate pain or severe pain. Patient not taking: Reported on 03/05/2016 02/12/16   Lavon Paganini Angiulli, PA-C    Family History Family History  Problem Relation Age of Onset  . Pancreatic cancer Mother   . CVA Father   . Dementia Father   . Diabetes Neg Hx     Social History Social History  Substance Use Topics  . Smoking status: Never Smoker  . Smokeless tobacco: Never Used  . Alcohol use No     Allergies   No known allergies   Review of Systems Review of Systems  Constitutional: Positive for chills and fatigue. Negative for fever.  HENT: Negative for ear pain and sore throat.   Eyes: Negative for pain and visual disturbance.  Respiratory: Negative for  cough and shortness of breath.   Cardiovascular: Negative for chest pain and palpitations.  Gastrointestinal: Positive for abdominal pain, diarrhea, nausea and vomiting. Negative for abdominal distention, anal bleeding and blood in stool.  Genitourinary: Negative for dysuria and hematuria.  Musculoskeletal: Negative for arthralgias and back pain.  Skin: Positive for wound (chronic). Negative for color change and rash.  Neurological: Negative for seizures and syncope.  All other systems reviewed and are negative.    Physical Exam Updated Vital Signs BP 121/84   Pulse 88   Temp 98.9 F (37.2 C) (Oral)   Resp 18   Ht 6\' 2"  (1.88 m)   Wt 86.2 kg   SpO2 98%   BMI 24.39 kg/m   Physical Exam  Constitutional: He appears well-developed and well-nourished.  HENT:  Head: Normocephalic and atraumatic.  Dry lips and mucous membranes.  Eyes: Conjunctivae are normal.  Neck: Neck supple.  Cardiovascular: Normal rate and regular rhythm.   No murmur heard. Pulmonary/Chest: Effort normal and breath sounds normal. No respiratory distress.  Abdominal: Soft. There is tenderness in the right  lower quadrant, suprapubic area and left lower quadrant. There is no rigidity, no rebound, no guarding, no CVA tenderness and negative Murphy's sign.  Musculoskeletal: He exhibits no edema.       Back:  Neurological: He is alert. No cranial nerve deficit or sensory deficit. He exhibits normal muscle tone. Coordination normal.  Skin: Skin is warm and dry.  Psychiatric: He has a normal mood and affect.  Nursing note and vitals reviewed.    ED Treatments / Results  Labs (all labs ordered are listed, but only abnormal results are displayed) Labs Reviewed  COMPREHENSIVE METABOLIC PANEL - Abnormal; Notable for the following:       Result Value   Chloride 98 (*)    Glucose, Bld 324 (*)    BUN 28 (*)    Creatinine, Ser 1.50 (*)    Albumin 3.2 (*)    GFR calc non Af Amer 44 (*)    GFR calc Af Amer 51 (*)    All other components within normal limits  CBC - Abnormal; Notable for the following:    WBC 10.8 (*)    Hemoglobin 12.7 (*)    All other components within normal limits  LIPASE, BLOOD  URINALYSIS, ROUTINE W REFLEX MICROSCOPIC (NOT AT [facility])    EKG  EKG Interpretation None       Radiology No results found.  Procedures Procedures (including critical care time)  Medications Ordered in ED Medications  oxyCODONE-acetaminophen (PERCOCET/ROXICET) 5-325 MG per tablet 1 tablet (1 tablet Oral Given 03/05/16 1730)  oxyCODONE-acetaminophen (PERCOCET/ROXICET) 5-325 MG per tablet (not administered)  ondansetron (ZOFRAN-ODT) disintegrating tablet 4 mg (4 mg Oral Given 03/05/16 1730)  sodium chloride 0.9 % bolus 1,000 mL (1,000 mLs Intravenous New Bag/Given 03/05/16 2132)     Initial Impression / Assessment and Plan / ED Course  I have reviewed the triage vital signs and the nursing notes.  Pertinent labs & imaging results that were available during my care of the patient were reviewed by me and considered in my medical decision making (see chart for details).  Clinical Course      75 year old gentleman comes today with decreased urinary production suprapubic discomfort dehydration nausea vomiting and diarrhea. He recently caught a diarrheal illness from a family friend. Intermittent nausea vomiting, with watery diarrhea. No blood in either. Vital signs are stable he's afebrile. He does have tenderness over his superpubic area.  This is worse suprapubic catheters placed due to urinary retention after a traumatic injury. His pelvis was pinned by a tractor. Is a wound on his back from this and these had difficulty urinating. Over the past 25 hours he's only had 250 mL of urine output. Bladder scan shows 15 mL of urine bladder. Urine attempted to be sent however we will not pull his catheter as it is new in 6 weeks. Kidney function shows elevated BUN and creatinine. Likely an acute kidney injury secondary to dehydration in the setting of a gastroenteritis. IV fluids are given. Patient will be admitted for further management. He also possibly in the change of the catheter for evaluation of infection. Signs stable time handoff care. Further manifestations care please see inpatient team notes.  Final Clinical Impressions(s) / ED Diagnoses   Final diagnoses:  AKI (acute kidney injury) ([place])  Dehydration  Gastroenteritis    New Prescriptions New Prescriptions   No medications on file     Dewaine Conger, MD 03/05/16 [id number]    Leo Grosser, MD 03/06/16 [phone]

## 2016-03-05 NOTE — ED Notes (Signed)
Report called and given to [facility] on 6E

## 2016-03-05 NOTE — ED Notes (Signed)
Pt was bladder scanned, <74mL of urine. Physician notified. IV and fluids started

## 2016-03-05 NOTE — ED Triage Notes (Addendum)
Pt has suprapubic catheter placed in Oct after tractor accident.  States since Tues pt has had decreased urine output, and increased abdominal pain and nausea.  Pt also states diarrhea.

## 2016-03-06 ENCOUNTER — Encounter (HOSPITAL_COMMUNITY): Payer: Self-pay

## 2016-03-06 DIAGNOSIS — N390 Urinary tract infection, site not specified: Secondary | ICD-10-CM

## 2016-03-06 DIAGNOSIS — L8990 Pressure ulcer of unspecified site, unspecified stage: Secondary | ICD-10-CM | POA: Insufficient documentation

## 2016-03-06 DIAGNOSIS — N179 Acute kidney failure, unspecified: Secondary | ICD-10-CM | POA: Diagnosis not present

## 2016-03-06 LAB — GLUCOSE, CAPILLARY
GLUCOSE-CAPILLARY: 98 mg/dL (ref 65–99)
Glucose-Capillary: 126 mg/dL — ABNORMAL HIGH (ref 65–99)
Glucose-Capillary: 155 mg/dL — ABNORMAL HIGH (ref 65–99)

## 2016-03-06 LAB — CBC
HCT: 34.6 % — ABNORMAL LOW (ref 39.0–52.0)
Hemoglobin: 10.9 g/dL — ABNORMAL LOW (ref 13.0–17.0)
MCH: 27.3 pg (ref 26.0–34.0)
MCHC: 31.5 g/dL (ref 30.0–36.0)
MCV: 86.5 fL (ref 78.0–100.0)
PLATELETS: 298 10*3/uL (ref 150–400)
RBC: 4 MIL/uL — AB (ref 4.22–5.81)
RDW: 14.8 % (ref 11.5–15.5)
WBC: 9.8 10*3/uL (ref 4.0–10.5)

## 2016-03-06 LAB — COMPREHENSIVE METABOLIC PANEL
ALK PHOS: 96 U/L (ref 38–126)
ALT: 14 U/L — AB (ref 17–63)
AST: 21 U/L (ref 15–41)
Albumin: 2.6 g/dL — ABNORMAL LOW (ref 3.5–5.0)
Anion gap: 12 (ref 5–15)
BUN: 24 mg/dL — ABNORMAL HIGH (ref 6–20)
CALCIUM: 8.5 mg/dL — AB (ref 8.9–10.3)
CO2: 23 mmol/L (ref 22–32)
CREATININE: 1.15 mg/dL (ref 0.61–1.24)
Chloride: 102 mmol/L (ref 101–111)
GFR calc non Af Amer: >60 mL/min (ref >60)
GLUCOSE: 107 mg/dL — AB (ref 65–99)
Potassium: 4 mmol/L (ref 3.5–5.1)
SODIUM: 137 mmol/L (ref 135–145)
Total Bilirubin: 0.4 mg/dL (ref 0.3–1.2)
Total Protein: 6.1 g/dL — ABNORMAL LOW (ref 6.5–8.1)

## 2016-03-06 LAB — TSH: TSH: 3.337 u[IU]/mL (ref 0.350–4.500)

## 2016-03-06 LAB — MAGNESIUM: Magnesium: 1.4 mg/dL — ABNORMAL LOW (ref 1.7–2.4)

## 2016-03-06 LAB — PHOSPHORUS: Phosphorus: 3.6 mg/dL (ref 2.5–4.6)

## 2016-03-06 MED ORDER — CEFPODOXIME PROXETIL 200 MG PO TABS: 200.0000 mg | ORAL_TABLET | Freq: Two times a day (BID) | ORAL | 0 refills | Status: DC

## 2016-03-06 MED ORDER — CEFPODOXIME PROXETIL 200 MG PO TABS
200.0000 mg | ORAL_TABLET | Freq: Two times a day (BID) | ORAL | Status: DC
  Administered 2016-03-06: 200 mg via ORAL
  Filled 2016-03-06: qty 1

## 2016-03-06 MED ORDER — ONDANSETRON 4 MG PO TBDP: 4.0000 mg | ORAL_TABLET | Freq: Three times a day (TID) | ORAL | 0 refills | Status: DC | PRN

## 2016-03-06 NOTE — Care Management Note (Signed)
Case Management Note  Patient Details  Name: Christian [name] MRN: [id number] Date of Birth: February 17, 1941  Subjective/Objective:       CM following for progression and d/c planning.              Action/Plan: 03/06/2016 Pt active with [facility] and wishes to resume services with that agency. MD to enter orders. This CM contacted [facility] and informed of plan to resume orders and services confirmed, HHRN, HHPT, HHOT and [place] aide.    Expected Discharge Date:     03/06/2016             Expected Discharge Plan:  [place]  In-House Referral:  NA  Discharge planning Services  CM Consult  Post Acute Care Choice:  NA Choice offered to:  NA  DME Arranged:  N/A DME Agency:  NA  HH Arranged:  RN, PT, OT, Nurse's Aide [place] [facility] Agency:  [facility]  Status of Service:  Completed, signed off  If discussed at [place] of Stay Meetings, dates discussed:    Additional Comments:  Adron Bene, RN 03/06/2016, 10:44 AM

## 2016-03-06 NOTE — Discharge Instructions (Signed)
Viral Gastroenteritis, Adult Introduction Viral gastroenteritis is also known as the stomach flu. This condition is caused by certain germs (viruses). These germs can be passed from person to person very easily (are very contagious). This condition can cause sudden watery poop (diarrhea), fever, and throwing up (vomiting). Having watery poop and throwing up can make you feel weak and cause you to get dehydrated. Dehydration can make you tired and thirsty, make you have a dry mouth, and make it so you pee (urinate) less often. Older adults and people with other diseases or a weak defense system (immune system) are at higher risk for dehydration. It is important to replace the fluids that you lose from having watery poop and throwing up. Follow these instructions at home: Follow instructions from your doctor about how to care for yourself at home. Eating and drinking Follow these instructions as told by your doctor:  Take an oral rehydration solution (ORS). This is a drink that is sold at pharmacies and stores.  Drink clear fluids in small amounts as you are able, such as:  Water.  Ice chips.  Diluted fruit juice.  Low-calorie sports drinks.  Eat bland, easy-to-digest foods in small amounts as you are able, such as:  Bananas.  Applesauce.  Rice.  Low-fat (lean) meats.  Toast.  Crackers.  Avoid fluids that have a lot of sugar or caffeine in them.  Avoid alcohol.  Avoid spicy or fatty foods. General instructions  Drink enough fluid to keep your pee (urine) clear or pale yellow.  Wash your hands often. If you cannot use soap and water, use hand sanitizer.  Make sure that all people in your home wash their hands well and often.  Rest at home while you get better.  Take over-the-counter and prescription medicines only as told by your doctor.  Watch your condition for any changes.  Take a warm bath to help with any burning or pain from having watery poop.  Keep all  follow-up visits as told by your doctor. This is important. Contact a doctor if:  You cannot keep fluids down.  Your symptoms get worse.  You have new symptoms.  You feel light-headed or dizzy.  You have muscle cramps. Get help right away if:  You have chest pain.  You feel very weak or you pass out (faint).  You see blood in your throw-up.  Your throw-up looks like coffee grounds.  You have bloody or black poop (stools) or poop that look like tar.  You have a very bad headache, a stiff neck, or both.  You have a rash.  You have very bad pain, cramping, or bloating in your belly (abdomen).  You have trouble breathing.  You are breathing very quickly.  Your heart is beating very quickly.  Your skin feels cold and clammy.  You feel confused.  You have pain when you pee.  You have signs of dehydration, such as:  [place]oden pee, hardly any pee, or no pee.  Cracked lips.  Dry mouth.  Sunken eyes.  Sleepiness.  Weakness. This information is not intended to replace advice given to you by your health care provider. Make sure you discuss any questions you have with your health care provider. Document Released: 09/16/2007 Document Revised: 10/18/2015 Document Reviewed: 12/04/2014  2017 Elsevier  

## 2016-03-06 NOTE — Progress Notes (Signed)
Christian [name] to be D/C'd Home per MD order.  Discussed prescriptions and follow up appointments with the patient. Prescriptions given to patient, medication list explained in detail. Pt verbalized understanding.    Medication List    STOP taking these medications   fentaNYL 50 MCG/HR Commonly known as:  DURAGESIC - dosed mcg/hr   multivitamins with iron Tabs tablet   Oxycodone HCl 10 MG Tabs     TAKE these medications   amLODipine 10 MG tablet Commonly known as:  NORVASC Take 1 tablet (10 mg total) by mouth daily.   ascorbic acid 500 MG tablet Commonly known as:  VITAMIN C Take 1 tablet (500 mg total) by mouth 2 (two) times daily.   atorvastatin 80 MG tablet Commonly known as:  LIPITOR Take 1 tablet (80 mg total) by mouth daily. What changed:  when to take this   calcium carbonate 1250 (500 Ca) MG tablet Commonly known as:  OS-CAL - dosed in mg of elemental calcium Take 1 tablet (500 mg of elemental calcium total) by mouth 3 (three) times daily with meals.   cefpodoxime 200 MG tablet Commonly known as:  VANTIN Take 1 tablet (200 mg total) by mouth every 12 (twelve) hours.   ferrous gluconate 324 MG tablet Commonly known as:  FERGON Take 1 tablet (324 mg total) by mouth 2 (two) times daily with a meal.   FLONASE 50 MCG/ACT nasal spray Generic drug:  fluticasone Place 2 sprays into both nostrils daily as needed for allergies or rhinitis.   gabapentin 300 MG capsule Commonly known as:  NEURONTIN Take 1 capsule (300 mg total) by mouth 3 (three) times daily. What changed:  when to take this   glipiZIDE 5 MG tablet Commonly known as:  GLUCOTROL Take 5-10 mg by mouth See admin instructions. Take 2 tablets (10 mg) by mouth every morning and 1 tablet (5 mg) at night   insulin aspart 100 UNIT/ML injection Commonly known as:  novoLOG Inject 4 Units into the skin 3 (three) times daily with meals.   insulin detemir 100 UNIT/ML injection Commonly known as:   LEVEMIR Inject 0.15 mLs (15 Units total) into the skin at bedtime.   metFORMIN 1000 MG tablet Commonly known as:  GLUCOPHAGE Take 1 tablet (1,000 mg total) by mouth 2 (two) times daily.   multivitamin with minerals Tabs tablet Take 1 tablet by mouth daily.   ondansetron 4 MG disintegrating tablet Commonly known as:  ZOFRAN ODT Take 1 tablet (4 mg total) by mouth every 8 (eight) hours as needed for nausea or vomiting.   OVER THE COUNTER MEDICATION Apply topically See admin instructions. Mix 1 quart water with 1 tablespoon plus 2 teaspoons clorox and use to cleanse wound on back twice daily   pantoprazole 40 MG tablet Commonly known as:  PROTONIX Take 1 tablet (40 mg total) by mouth 2 (two) times daily.   senna-docusate 8.6-50 MG tablet Commonly known as:  Senokot-S Take 2 tablets by mouth 2 (two) times daily. What changed:  when to take this  reasons to take this   traMADol 50 MG tablet Commonly known as:  ULTRAM Take 50 mg by mouth every 12 (twelve) hours as needed (pain).   zinc sulfate 220 (50 Zn) MG capsule Take 1 capsule (220 mg total) by mouth daily.       Vitals:   03/06/16 0526 03/06/16 0955  BP: 120/66 128/70  Pulse: 79 69  Resp: 19 18  Temp: 98.6 F (37 C)  98.1 F (36.7 C)    Skin clean, dry and intact without evidence of skin break down, no evidence of skin tears noted. IV catheter discontinued intact. Site without signs and symptoms of complications. Dressing and pressure applied. Pt denies pain at this time. No complaints noted.  An After Visit Summary was printed and given to the patient. Patient escorted via [place], and D/C home via private auto.  Dixie Dials RN, BSN

## 2016-03-06 NOTE — Discharge Summary (Signed)
[facility]  Physician Discharge Summary   Patient ID: Christian [name] MRN: [id number] DOB/AGE: 74-09-1940 74 y.o.  Admit date: 03/05/2016 Discharge date: 03/06/2016  PCP: Jene Every, MD  DISCHARGE DIAGNOSES:  Active Problems:   Urethral injury   Acute kidney injury ([place])   DM type 2 with diabetic peripheral neuropathy (HCC)   Benign essential HTN   Gastroenteritis   Dehydration   Pressure injury of skin   RECOMMENDATIONS FOR OUTPATIENT FOLLOW UP: 1. Patient to call his urologist office regarding his suprapubic catheter   DISCHARGE CONDITION: fair  Diet recommendation: As before  Filed Weights   03/05/16 1719 03/06/16 0024  Weight: 86.2 kg (190 lb) 86.3 kg (190 lb 4.8 oz)    INITIAL HISTORY: 75 year old Caucasian male with a past medical history of multiple pelvic fractures, status post sacroiliac screw fixation secondary to recent trauma, unilateral, status post suprapubic catheter placement, diabetes mellitus type 2, sacral wound, hypertension, presented with 2 day history of nausea, vomiting and diarrhea. He had decreased urine output. Patient was found to have acute renal failure. He was hospitalized for further management.   HOSPITAL COURSE:   Mild acute renal failure Most likely secondary to dehydration and hypovolemia from his GI symptomatology. Patient was given IV fluids. His renal function is back to baseline. He is making urine without any difficulty. Urine has become more clear.  Abnormal UA. Possible UTI. Nausea, vomiting Patient did have abnormal UA. Cultures are pending. He did have symptoms of nausea, vomiting and some abdominal pain as well. Differential diagnosis for his presentation includes either gastroenteritis or symptoms due to urinary tract infection. He may benefit from a course of antibiotics. He has not been on antibiotics recently. Will be discharged on Vantin. His nausea, vomiting. Symptoms have resolved. He had a formed stool this  morning. He tolerated his diet.  History of urethral injury, status post suprapubic catheter. Last changed on October 31. He will discuss this further with his urologist on Monday.  History of diabetes mellitus type 2 with diabetic peripheral neuropathy. Continue with his home medications.  History of benign essential hypertension. Stable. Continue with her medications.  Overall, patient has improved. He is very keen on going home today. Discussed with patient and his wife in detail. Okay for discharge.  PERTINENT LABS:  The results of significant diagnostics from this hospitalization (including imaging, microbiology, ancillary and laboratory) are listed below for reference.     Labs: Basic Metabolic Panel:  Recent Labs Lab 03/05/16 1743 03/06/16 0345  NA 135 137  K 4.6 4.0  CL 98* 102  CO2 23 23  GLUCOSE 324* 107*  BUN 28* 24*  CREATININE 1.50* 1.15  CALCIUM 9.3 8.5*  MG  --  1.4*  PHOS  --  3.6   Liver Function Tests:  Recent Labs Lab 03/05/16 1743 03/06/16 0345  AST 28 21  ALT 18 14*  ALKPHOS 121 96  BILITOT 0.3 0.4  PROT 7.8 6.1*  ALBUMIN 3.2* 2.6*    Recent Labs Lab 03/05/16 1743  LIPASE 35   CBC:  Recent Labs Lab 03/05/16 1743 03/06/16 0345  WBC 10.8* 9.8  HGB 12.7* 10.9*  HCT 39.8 34.6*  MCV 86.5 86.5  PLT 393 298    CBG:  Recent Labs Lab 03/05/16 2352 03/06/16 0523 03/06/16 0735 03/06/16 1112  GLUCAP 85 98 126* 155*     IMAGING STUDIES Ir Catheter Tube Change  Result Date: 02/11/2016 INDICATION: Pain at exit site of indwelling 12 French suprapubic bladder  drainage catheter originally placed on 01/10/2016. EXAM: IR CATHETER TUBE CHANGE COMPARISON:  01/10/2016 MEDICATIONS: None ANESTHESIA/SEDATION: None CONTRAST:  10 mL Isovue-300 - administered into the collecting system(s) FLUOROSCOPY TIME:  Fluoroscopy Time: 12 seconds (2 mGy). COMPLICATIONS: None immediate. PROCEDURE: Informed written consent was obtained from the patient  after a thorough discussion of the procedural risks, benefits and alternatives. All questions were addressed. Maximal Sterile Barrier Technique was utilized including caps, mask, sterile gowns, sterile gloves, sterile drape, hand hygiene and skin antiseptic. A timeout was performed prior to the initiation of the procedure. The pre-existing 12 French suprapubic tube was prepped and draped. Diluted contrast was injected into the bladder lumen. The tube was cut and removed over a guidewire. A new 12 French drainage catheter was advanced over the wire and formed in the bladder lumen. Catheter positioning was confirmed by a fluoroscopic spot image. The catheter was secured with a Prolene retention suture and connected to a gravity drainage bag. FINDINGS: The pre-existing catheter is normally positioned in the bladder lumen. Catheter exchange was performed with the new catheter extending into the bladder lumen. There is return of urine from the suprapubic catheter after replacement. IMPRESSION: Exchange of indwelling 12 French suprapubic bladder drainage catheter. Electronically Signed   By: Aletta Edouard M.D.   On: 02/11/2016 16:20    DISCHARGE EXAMINATION: Vitals:   03/05/16 2330 03/06/16 0024 03/06/16 0526 03/06/16 0955  BP: 124/75 135/70 120/66 128/70  Pulse: 85 91 79 69  Resp:  20 19 18   Temp:  98.6 F (37 C) 98.6 F (37 C) 98.1 F (36.7 C)  TempSrc:  Oral Oral Oral  SpO2: 96% 100% 96% 96%  Weight:  86.3 kg (190 lb 4.8 oz)    Height:  6\' 2"  (1.88 m)     General appearance: alert, cooperative, appears stated age and no distress Resp: clear to auscultation bilaterally Cardio: regular rate and rhythm, S1, S2 normal, no murmur, click, rub or gallop GI: Abdomen soft. Slightly tender over the suprapubic area without any rebound, rigidity or guarding. Suprapubic catheter is noted Extremities: extremities normal, atraumatic, no cyanosis or edema  DISPOSITION: Home with wife  Discharge Instructions     Call MD for:  difficulty breathing, headache or visual disturbances    Complete by:  As directed    Call MD for:  extreme fatigue    Complete by:  As directed    Call MD for:  persistant dizziness or light-headedness    Complete by:  As directed    Call MD for:  persistant nausea and vomiting    Complete by:  As directed    Call MD for:  severe uncontrolled pain    Complete by:  As directed    Call MD for:  temperature >100.4    Complete by:  As directed    Discharge instructions    Complete by:  As directed    Please be sure to call your urologist on Monday to set up the catheter change. Please stay well hydrated. Seek attention appears symptoms recur. Take medications as prescribed.  You were cared for by a hospitalist during your hospital stay. If you have any questions about your discharge medications or the care you received while you were in the hospital after you are discharged, you can call the unit and asked to speak with the hospitalist on call if the hospitalist that took care of you is not available. Once you are discharged, your primary care physician will handle any  further medical issues. Please note that NO REFILLS for any discharge medications will be authorized once you are discharged, as it is imperative that you return to your primary care physician (or establish a relationship with a primary care physician if you do not have one) for your aftercare needs so that they can reassess your need for medications and monitor your lab values. If you do not have a primary care physician, you can call [phone] for a physician referral.   Discharge wound care:    Complete by:  As directed    As before   Increase activity slowly    Complete by:  As directed       ALLERGIES:  Allergies  Allergen Reactions  . No Known Allergies       Discharge Medication List as of 03/06/2016  2:26 PM    START taking these medications   Details  cefpodoxime (VANTIN) 200 MG tablet Take 1  tablet (200 mg total) by mouth every 12 (twelve) hours., Starting Fri 03/06/2016, Normal    ondansetron (ZOFRAN ODT) 4 MG disintegrating tablet Take 1 tablet (4 mg total) by mouth every 8 (eight) hours as needed for nausea or vomiting., Starting Fri 03/06/2016, Normal      CONTINUE these medications which have NOT CHANGED   Details  amLODipine (NORVASC) 10 MG tablet Take 1 tablet (10 mg total) by mouth daily., Starting Wed 02/12/2016, Print    atorvastatin (LIPITOR) 80 MG tablet Take 1 tablet (80 mg total) by mouth daily., Starting Wed 02/12/2016, Print    calcium carbonate (OS-CAL - DOSED IN MG OF ELEMENTAL CALCIUM) 1250 (500 Ca) MG tablet Take 1 tablet (500 mg of elemental calcium total) by mouth 3 (three) times daily with meals., Starting Wed 02/12/2016, Print    ferrous gluconate (FERGON) 324 MG tablet Take 1 tablet (324 mg total) by mouth 2 (two) times daily with a meal., Starting Wed 02/12/2016, Print    fluticasone (FLONASE) 50 MCG/ACT nasal spray Place 2 sprays into both nostrils daily as needed for allergies or rhinitis. , Historical Med    gabapentin (NEURONTIN) 300 MG capsule Take 1 capsule (300 mg total) by mouth 3 (three) times daily., Starting Wed 02/12/2016, Print    glipiZIDE (GLUCOTROL) 5 MG tablet Take 5-10 mg by mouth See admin instructions. Take 2 tablets (10 mg) by mouth every morning and 1 tablet (5 mg) at night, Starting Fri 01/10/2016, Historical Med    insulin aspart (NOVOLOG) 100 UNIT/ML injection Inject 4 Units into the skin 3 (three) times daily with meals., Starting Wed 02/12/2016, Print    insulin detemir (LEVEMIR) 100 UNIT/ML injection Inject 0.15 mLs (15 Units total) into the skin at bedtime., Starting Wed 02/12/2016, Print    metFORMIN (GLUCOPHAGE) 1000 MG tablet Take 1 tablet (1,000 mg total) by mouth 2 (two) times daily., Starting Wed 02/12/2016, Print    Multiple Vitamin (MULTIVITAMIN WITH MINERALS) TABS tablet Take 1 tablet by mouth daily., Historical Med      OVER THE COUNTER MEDICATION Apply topically See admin instructions. Mix 1 quart water with 1 tablespoon plus 2 teaspoons clorox and use to cleanse wound on back twice daily, Historical Med    pantoprazole (PROTONIX) 40 MG tablet Take 1 tablet (40 mg total) by mouth 2 (two) times daily., Starting Wed 02/12/2016, Print    senna-docusate (SENOKOT-S) 8.6-50 MG tablet Take 2 tablets by mouth 2 (two) times daily., Starting Wed 02/12/2016, No Print    traMADol (ULTRAM) 50 MG tablet Take 50  mg by mouth every 12 (twelve) hours as needed (pain)., Historical Med    vitamin C (VITAMIN C) 500 MG tablet Take 1 tablet (500 mg total) by mouth 2 (two) times daily., Starting Wed 02/12/2016, Print    zinc sulfate 220 (50 Zn) MG capsule Take 1 capsule (220 mg total) by mouth daily., Starting Wed 02/12/2016, Print      STOP taking these medications     fentaNYL (DURAGESIC - DOSED MCG/HR) 50 MCG/HR      Multiple Vitamins-Iron (MULTIVITAMINS WITH IRON) TABS tablet      oxyCODONE 10 MG TABS      oxyCODONE 10 MG TABS          Follow-up Information    SIGMUND I Gaynelle Arabian, MD Follow up.   Specialty:  Urology Why:  pleae call his office on monday to schedule the change of catheter Contact information: [place]  [id number] [phone]           TOTAL DISCHARGE TIME: 73 minutes  [facility] Pager [phone]  03/06/2016, 4:09 PM

## 2016-03-07 DIAGNOSIS — S32501D Unspecified fracture of right pubis, subsequent encounter for fracture with routine healing: Secondary | ICD-10-CM | POA: Diagnosis not present

## 2016-03-07 DIAGNOSIS — L89153 Pressure ulcer of sacral region, stage 3: Secondary | ICD-10-CM | POA: Diagnosis not present

## 2016-03-07 DIAGNOSIS — S32811D Multiple fractures of pelvis with unstable disruption of pelvic ring, subsequent encounter for fracture with routine healing: Secondary | ICD-10-CM | POA: Diagnosis not present

## 2016-03-07 DIAGNOSIS — D62 Acute posthemorrhagic anemia: Secondary | ICD-10-CM | POA: Diagnosis not present

## 2016-03-07 DIAGNOSIS — E1142 Type 2 diabetes mellitus with diabetic polyneuropathy: Secondary | ICD-10-CM | POA: Diagnosis not present

## 2016-03-07 DIAGNOSIS — S3730XD Unspecified injury of urethra, subsequent encounter: Secondary | ICD-10-CM | POA: Diagnosis not present

## 2016-03-07 LAB — HEMOGLOBIN A1C
Hgb A1c MFr Bld: 5.7 % — ABNORMAL HIGH (ref 4.8–5.6)
MEAN PLASMA GLUCOSE: 117 mg/dL

## 2016-03-09 DIAGNOSIS — L98423 Non-pressure chronic ulcer of back with necrosis of muscle: Secondary | ICD-10-CM | POA: Diagnosis not present

## 2016-03-10 ENCOUNTER — Other Ambulatory Visit (HOSPITAL_COMMUNITY): Payer: Self-pay | Admitting: Urology

## 2016-03-10 DIAGNOSIS — D62 Acute posthemorrhagic anemia: Secondary | ICD-10-CM | POA: Diagnosis not present

## 2016-03-10 DIAGNOSIS — E1142 Type 2 diabetes mellitus with diabetic polyneuropathy: Secondary | ICD-10-CM | POA: Diagnosis not present

## 2016-03-10 DIAGNOSIS — L89153 Pressure ulcer of sacral region, stage 3: Secondary | ICD-10-CM | POA: Diagnosis not present

## 2016-03-10 DIAGNOSIS — S32501D Unspecified fracture of right pubis, subsequent encounter for fracture with routine healing: Secondary | ICD-10-CM | POA: Diagnosis not present

## 2016-03-10 DIAGNOSIS — S32811D Multiple fractures of pelvis with unstable disruption of pelvic ring, subsequent encounter for fracture with routine healing: Secondary | ICD-10-CM | POA: Diagnosis not present

## 2016-03-10 DIAGNOSIS — N35014 Post-traumatic urethral stricture, male, unspecified: Secondary | ICD-10-CM

## 2016-03-10 DIAGNOSIS — S3730XD Unspecified injury of urethra, subsequent encounter: Secondary | ICD-10-CM | POA: Diagnosis not present

## 2016-03-11 DIAGNOSIS — I10 Essential (primary) hypertension: Secondary | ICD-10-CM | POA: Diagnosis not present

## 2016-03-11 DIAGNOSIS — L89154 Pressure ulcer of sacral region, stage 4: Secondary | ICD-10-CM | POA: Diagnosis not present

## 2016-03-11 DIAGNOSIS — S3730XD Unspecified injury of urethra, subsequent encounter: Secondary | ICD-10-CM | POA: Diagnosis not present

## 2016-03-11 DIAGNOSIS — N179 Acute kidney failure, unspecified: Secondary | ICD-10-CM | POA: Diagnosis not present

## 2016-03-11 DIAGNOSIS — E1165 Type 2 diabetes mellitus with hyperglycemia: Secondary | ICD-10-CM | POA: Diagnosis not present

## 2016-03-11 DIAGNOSIS — T1490XA Injury, unspecified, initial encounter: Secondary | ICD-10-CM | POA: Diagnosis not present

## 2016-03-11 DIAGNOSIS — S32811D Multiple fractures of pelvis with unstable disruption of pelvic ring, subsequent encounter for fracture with routine healing: Secondary | ICD-10-CM | POA: Diagnosis not present

## 2016-03-11 DIAGNOSIS — Z6825 Body mass index (BMI) 25.0-25.9, adult: Secondary | ICD-10-CM | POA: Diagnosis not present

## 2016-03-12 DIAGNOSIS — E1142 Type 2 diabetes mellitus with diabetic polyneuropathy: Secondary | ICD-10-CM | POA: Diagnosis not present

## 2016-03-12 DIAGNOSIS — L89153 Pressure ulcer of sacral region, stage 3: Secondary | ICD-10-CM | POA: Diagnosis not present

## 2016-03-12 DIAGNOSIS — S3730XD Unspecified injury of urethra, subsequent encounter: Secondary | ICD-10-CM | POA: Diagnosis not present

## 2016-03-12 DIAGNOSIS — S32811D Multiple fractures of pelvis with unstable disruption of pelvic ring, subsequent encounter for fracture with routine healing: Secondary | ICD-10-CM | POA: Diagnosis not present

## 2016-03-12 DIAGNOSIS — S32501D Unspecified fracture of right pubis, subsequent encounter for fracture with routine healing: Secondary | ICD-10-CM | POA: Diagnosis not present

## 2016-03-12 DIAGNOSIS — D62 Acute posthemorrhagic anemia: Secondary | ICD-10-CM | POA: Diagnosis not present

## 2016-03-13 DIAGNOSIS — S3730XD Unspecified injury of urethra, subsequent encounter: Secondary | ICD-10-CM | POA: Diagnosis not present

## 2016-03-13 DIAGNOSIS — D62 Acute posthemorrhagic anemia: Secondary | ICD-10-CM | POA: Diagnosis not present

## 2016-03-13 DIAGNOSIS — S32501D Unspecified fracture of right pubis, subsequent encounter for fracture with routine healing: Secondary | ICD-10-CM | POA: Diagnosis not present

## 2016-03-13 DIAGNOSIS — L89153 Pressure ulcer of sacral region, stage 3: Secondary | ICD-10-CM | POA: Diagnosis not present

## 2016-03-13 DIAGNOSIS — S32811D Multiple fractures of pelvis with unstable disruption of pelvic ring, subsequent encounter for fracture with routine healing: Secondary | ICD-10-CM | POA: Diagnosis not present

## 2016-03-13 DIAGNOSIS — E1142 Type 2 diabetes mellitus with diabetic polyneuropathy: Secondary | ICD-10-CM | POA: Diagnosis not present

## 2016-03-14 DIAGNOSIS — L89153 Pressure ulcer of sacral region, stage 3: Secondary | ICD-10-CM | POA: Diagnosis not present

## 2016-03-14 DIAGNOSIS — E1142 Type 2 diabetes mellitus with diabetic polyneuropathy: Secondary | ICD-10-CM | POA: Diagnosis not present

## 2016-03-14 DIAGNOSIS — D62 Acute posthemorrhagic anemia: Secondary | ICD-10-CM | POA: Diagnosis not present

## 2016-03-14 DIAGNOSIS — S32501D Unspecified fracture of right pubis, subsequent encounter for fracture with routine healing: Secondary | ICD-10-CM | POA: Diagnosis not present

## 2016-03-14 DIAGNOSIS — S3730XD Unspecified injury of urethra, subsequent encounter: Secondary | ICD-10-CM | POA: Diagnosis not present

## 2016-03-14 DIAGNOSIS — S32811D Multiple fractures of pelvis with unstable disruption of pelvic ring, subsequent encounter for fracture with routine healing: Secondary | ICD-10-CM | POA: Diagnosis not present

## 2016-03-16 ENCOUNTER — Other Ambulatory Visit (HOSPITAL_COMMUNITY): Payer: Self-pay | Admitting: Urology

## 2016-03-16 ENCOUNTER — Encounter (HOSPITAL_COMMUNITY): Payer: Self-pay | Admitting: Interventional Radiology

## 2016-03-16 ENCOUNTER — Ambulatory Visit (HOSPITAL_COMMUNITY)
Admission: RE | Admit: 2016-03-16 | Discharge: 2016-03-16 | Disposition: A | Discharge status: Home / Self Care | Payer: Medicare Other | Source: Ambulatory Visit | Attending: Urology | Admitting: Urology

## 2016-03-16 DIAGNOSIS — Z9889 Other specified postprocedural states: Secondary | ICD-10-CM | POA: Diagnosis not present

## 2016-03-16 DIAGNOSIS — Z960 Presence of urogenital implants: Secondary | ICD-10-CM | POA: Diagnosis not present

## 2016-03-16 DIAGNOSIS — S32501D Unspecified fracture of right pubis, subsequent encounter for fracture with routine healing: Secondary | ICD-10-CM | POA: Diagnosis not present

## 2016-03-16 DIAGNOSIS — N35014 Post-traumatic urethral stricture, male, unspecified: Secondary | ICD-10-CM | POA: Insufficient documentation

## 2016-03-16 DIAGNOSIS — L89153 Pressure ulcer of sacral region, stage 3: Secondary | ICD-10-CM | POA: Diagnosis not present

## 2016-03-16 DIAGNOSIS — S3730XD Unspecified injury of urethra, subsequent encounter: Secondary | ICD-10-CM | POA: Diagnosis not present

## 2016-03-16 DIAGNOSIS — S32811D Multiple fractures of pelvis with unstable disruption of pelvic ring, subsequent encounter for fracture with routine healing: Secondary | ICD-10-CM | POA: Diagnosis not present

## 2016-03-16 DIAGNOSIS — Z436 Encounter for attention to other artificial openings of urinary tract: Secondary | ICD-10-CM | POA: Diagnosis not present

## 2016-03-16 DIAGNOSIS — E1142 Type 2 diabetes mellitus with diabetic polyneuropathy: Secondary | ICD-10-CM | POA: Diagnosis not present

## 2016-03-16 DIAGNOSIS — D62 Acute posthemorrhagic anemia: Secondary | ICD-10-CM | POA: Diagnosis not present

## 2016-03-16 HISTORY — PX: IR GENERIC HISTORICAL: IMG1180011

## 2016-03-16 MED ORDER — LIDOCAINE HCL 1 % IJ SOLN
INTRAMUSCULAR | Status: AC
  Filled 2016-03-16: qty 20

## 2016-03-16 MED ORDER — IOPAMIDOL (ISOVUE-300) INJECTION 61%
INTRAVENOUS | Status: AC
  Administered 2016-03-16: 15 mL
  Filled 2016-03-16: qty 50

## 2016-03-16 MED ORDER — IOPAMIDOL (ISOVUE-300) INJECTION 61%
50.0000 mL | Freq: Once | INTRAVENOUS | Status: AC | PRN
  Administered 2016-03-16: 15 mL

## 2016-03-16 MED ORDER — LIDOCAINE HCL 1 % IJ SOLN
INTRAMUSCULAR | Status: AC | PRN
  Administered 2016-03-16: 5 mL via INTRADERMAL

## 2016-03-16 NOTE — Procedures (Signed)
Technically successful fluoro guided exchange and upsizing of now 14 Fr drainage suprapubic catheter. EBL: None No immediate post procedural complications.   Ronny Bacon, MD Pager #: [phone]

## 2016-03-17 DIAGNOSIS — D62 Acute posthemorrhagic anemia: Secondary | ICD-10-CM | POA: Diagnosis not present

## 2016-03-17 DIAGNOSIS — N200 Calculus of kidney: Secondary | ICD-10-CM | POA: Diagnosis not present

## 2016-03-17 DIAGNOSIS — R3914 Feeling of incomplete bladder emptying: Secondary | ICD-10-CM | POA: Diagnosis not present

## 2016-03-17 DIAGNOSIS — S32811D Multiple fractures of pelvis with unstable disruption of pelvic ring, subsequent encounter for fracture with routine healing: Secondary | ICD-10-CM | POA: Diagnosis not present

## 2016-03-17 DIAGNOSIS — L89153 Pressure ulcer of sacral region, stage 3: Secondary | ICD-10-CM | POA: Diagnosis not present

## 2016-03-17 DIAGNOSIS — S32501D Unspecified fracture of right pubis, subsequent encounter for fracture with routine healing: Secondary | ICD-10-CM | POA: Diagnosis not present

## 2016-03-17 DIAGNOSIS — S3730XD Unspecified injury of urethra, subsequent encounter: Secondary | ICD-10-CM | POA: Diagnosis not present

## 2016-03-17 DIAGNOSIS — E1142 Type 2 diabetes mellitus with diabetic polyneuropathy: Secondary | ICD-10-CM | POA: Diagnosis not present

## 2016-03-18 DIAGNOSIS — S32501D Unspecified fracture of right pubis, subsequent encounter for fracture with routine healing: Secondary | ICD-10-CM | POA: Diagnosis not present

## 2016-03-18 DIAGNOSIS — E1142 Type 2 diabetes mellitus with diabetic polyneuropathy: Secondary | ICD-10-CM | POA: Diagnosis not present

## 2016-03-18 DIAGNOSIS — S3730XD Unspecified injury of urethra, subsequent encounter: Secondary | ICD-10-CM | POA: Diagnosis not present

## 2016-03-18 DIAGNOSIS — L89153 Pressure ulcer of sacral region, stage 3: Secondary | ICD-10-CM | POA: Diagnosis not present

## 2016-03-18 DIAGNOSIS — D62 Acute posthemorrhagic anemia: Secondary | ICD-10-CM | POA: Diagnosis not present

## 2016-03-18 DIAGNOSIS — S32811D Multiple fractures of pelvis with unstable disruption of pelvic ring, subsequent encounter for fracture with routine healing: Secondary | ICD-10-CM | POA: Diagnosis not present

## 2016-03-19 ENCOUNTER — Encounter (HOSPITAL_BASED_OUTPATIENT_CLINIC_OR_DEPARTMENT_OTHER): Payer: Medicare Other | Attending: Internal Medicine

## 2016-03-19 DIAGNOSIS — Z794 Long term (current) use of insulin: Secondary | ICD-10-CM | POA: Diagnosis not present

## 2016-03-19 DIAGNOSIS — L98492 Non-pressure chronic ulcer of skin of other sites with fat layer exposed: Secondary | ICD-10-CM | POA: Diagnosis not present

## 2016-03-19 DIAGNOSIS — L98423 Non-pressure chronic ulcer of back with necrosis of muscle: Secondary | ICD-10-CM | POA: Insufficient documentation

## 2016-03-19 DIAGNOSIS — I10 Essential (primary) hypertension: Secondary | ICD-10-CM | POA: Insufficient documentation

## 2016-03-19 DIAGNOSIS — E1142 Type 2 diabetes mellitus with diabetic polyneuropathy: Secondary | ICD-10-CM | POA: Diagnosis not present

## 2016-03-19 DIAGNOSIS — E1140 Type 2 diabetes mellitus with diabetic neuropathy, unspecified: Secondary | ICD-10-CM | POA: Insufficient documentation

## 2016-03-19 DIAGNOSIS — S32811D Multiple fractures of pelvis with unstable disruption of pelvic ring, subsequent encounter for fracture with routine healing: Secondary | ICD-10-CM | POA: Diagnosis not present

## 2016-03-19 DIAGNOSIS — L89153 Pressure ulcer of sacral region, stage 3: Secondary | ICD-10-CM | POA: Diagnosis not present

## 2016-03-19 DIAGNOSIS — S32501D Unspecified fracture of right pubis, subsequent encounter for fracture with routine healing: Secondary | ICD-10-CM | POA: Diagnosis not present

## 2016-03-19 DIAGNOSIS — S381XXD Crushing injury of abdomen, lower back, and pelvis, subsequent encounter: Secondary | ICD-10-CM | POA: Diagnosis not present

## 2016-03-19 DIAGNOSIS — S3730XD Unspecified injury of urethra, subsequent encounter: Secondary | ICD-10-CM | POA: Diagnosis not present

## 2016-03-19 DIAGNOSIS — D62 Acute posthemorrhagic anemia: Secondary | ICD-10-CM | POA: Diagnosis not present

## 2016-03-21 DIAGNOSIS — S3730XD Unspecified injury of urethra, subsequent encounter: Secondary | ICD-10-CM | POA: Diagnosis not present

## 2016-03-21 DIAGNOSIS — S32501D Unspecified fracture of right pubis, subsequent encounter for fracture with routine healing: Secondary | ICD-10-CM | POA: Diagnosis not present

## 2016-03-21 DIAGNOSIS — E1142 Type 2 diabetes mellitus with diabetic polyneuropathy: Secondary | ICD-10-CM | POA: Diagnosis not present

## 2016-03-21 DIAGNOSIS — L89153 Pressure ulcer of sacral region, stage 3: Secondary | ICD-10-CM | POA: Diagnosis not present

## 2016-03-21 DIAGNOSIS — S32811D Multiple fractures of pelvis with unstable disruption of pelvic ring, subsequent encounter for fracture with routine healing: Secondary | ICD-10-CM | POA: Diagnosis not present

## 2016-03-21 DIAGNOSIS — D62 Acute posthemorrhagic anemia: Secondary | ICD-10-CM | POA: Diagnosis not present

## 2016-03-23 DIAGNOSIS — S32811D Multiple fractures of pelvis with unstable disruption of pelvic ring, subsequent encounter for fracture with routine healing: Secondary | ICD-10-CM | POA: Diagnosis not present

## 2016-03-24 DIAGNOSIS — L89153 Pressure ulcer of sacral region, stage 3: Secondary | ICD-10-CM | POA: Diagnosis not present

## 2016-03-24 DIAGNOSIS — E1142 Type 2 diabetes mellitus with diabetic polyneuropathy: Secondary | ICD-10-CM | POA: Diagnosis not present

## 2016-03-24 DIAGNOSIS — S3730XD Unspecified injury of urethra, subsequent encounter: Secondary | ICD-10-CM | POA: Diagnosis not present

## 2016-03-24 DIAGNOSIS — S32811D Multiple fractures of pelvis with unstable disruption of pelvic ring, subsequent encounter for fracture with routine healing: Secondary | ICD-10-CM | POA: Diagnosis not present

## 2016-03-24 DIAGNOSIS — D62 Acute posthemorrhagic anemia: Secondary | ICD-10-CM | POA: Diagnosis not present

## 2016-03-24 DIAGNOSIS — S32501D Unspecified fracture of right pubis, subsequent encounter for fracture with routine healing: Secondary | ICD-10-CM | POA: Diagnosis not present

## 2016-03-25 DIAGNOSIS — S3730XD Unspecified injury of urethra, subsequent encounter: Secondary | ICD-10-CM | POA: Diagnosis not present

## 2016-03-25 DIAGNOSIS — D62 Acute posthemorrhagic anemia: Secondary | ICD-10-CM | POA: Diagnosis not present

## 2016-03-25 DIAGNOSIS — S32811D Multiple fractures of pelvis with unstable disruption of pelvic ring, subsequent encounter for fracture with routine healing: Secondary | ICD-10-CM | POA: Diagnosis not present

## 2016-03-25 DIAGNOSIS — L89153 Pressure ulcer of sacral region, stage 3: Secondary | ICD-10-CM | POA: Diagnosis not present

## 2016-03-25 DIAGNOSIS — E1142 Type 2 diabetes mellitus with diabetic polyneuropathy: Secondary | ICD-10-CM | POA: Diagnosis not present

## 2016-03-25 DIAGNOSIS — S32501D Unspecified fracture of right pubis, subsequent encounter for fracture with routine healing: Secondary | ICD-10-CM | POA: Diagnosis not present

## 2016-03-26 ENCOUNTER — Ambulatory Visit (INDEPENDENT_AMBULATORY_CARE_PROVIDER_SITE_OTHER): Payer: Medicare Other | Admitting: Family Medicine

## 2016-03-26 ENCOUNTER — Encounter: Payer: Self-pay | Admitting: Family Medicine

## 2016-03-26 VITALS — BP 104/70 | HR 102 | Temp 97.9°F | Ht 74.0 in | Wt 196.8 lb

## 2016-03-26 DIAGNOSIS — N179 Acute kidney failure, unspecified: Secondary | ICD-10-CM | POA: Diagnosis not present

## 2016-03-26 DIAGNOSIS — S32811D Multiple fractures of pelvis with unstable disruption of pelvic ring, subsequent encounter for fracture with routine healing: Secondary | ICD-10-CM | POA: Diagnosis not present

## 2016-03-26 DIAGNOSIS — S3730XD Unspecified injury of urethra, subsequent encounter: Secondary | ICD-10-CM | POA: Diagnosis not present

## 2016-03-26 DIAGNOSIS — L89153 Pressure ulcer of sacral region, stage 3: Secondary | ICD-10-CM | POA: Diagnosis not present

## 2016-03-26 DIAGNOSIS — D62 Acute posthemorrhagic anemia: Secondary | ICD-10-CM

## 2016-03-26 DIAGNOSIS — L98492 Non-pressure chronic ulcer of skin of other sites with fat layer exposed: Secondary | ICD-10-CM | POA: Diagnosis not present

## 2016-03-26 DIAGNOSIS — I10 Essential (primary) hypertension: Secondary | ICD-10-CM | POA: Diagnosis not present

## 2016-03-26 DIAGNOSIS — W309XXS Contact with unspecified agricultural machinery, sequela: Secondary | ICD-10-CM

## 2016-03-26 DIAGNOSIS — E1140 Type 2 diabetes mellitus with diabetic neuropathy, unspecified: Secondary | ICD-10-CM | POA: Diagnosis not present

## 2016-03-26 DIAGNOSIS — D649 Anemia, unspecified: Secondary | ICD-10-CM | POA: Diagnosis not present

## 2016-03-26 DIAGNOSIS — S32501D Unspecified fracture of right pubis, subsequent encounter for fracture with routine healing: Secondary | ICD-10-CM | POA: Diagnosis not present

## 2016-03-26 DIAGNOSIS — Z23 Encounter for immunization: Secondary | ICD-10-CM | POA: Diagnosis not present

## 2016-03-26 DIAGNOSIS — Z794 Long term (current) use of insulin: Secondary | ICD-10-CM | POA: Diagnosis not present

## 2016-03-26 DIAGNOSIS — E1142 Type 2 diabetes mellitus with diabetic polyneuropathy: Secondary | ICD-10-CM | POA: Diagnosis not present

## 2016-03-26 DIAGNOSIS — L98423 Non-pressure chronic ulcer of back with necrosis of muscle: Secondary | ICD-10-CM | POA: Diagnosis not present

## 2016-03-26 LAB — COMPREHENSIVE METABOLIC PANEL
ALK PHOS: 111 U/L (ref 39–117)
ALT: 17 U/L (ref 0–53)
AST: 21 U/L (ref 0–37)
Albumin: 4 g/dL (ref 3.5–5.2)
BILIRUBIN TOTAL: 0.4 mg/dL (ref 0.2–1.2)
BUN: 14 mg/dL (ref 6–23)
CALCIUM: 9.8 mg/dL (ref 8.4–10.5)
CO2: 29 meq/L (ref 19–32)
Chloride: 101 mEq/L (ref 96–112)
Creatinine, Ser: 0.89 mg/dL (ref 0.40–1.50)
GFR: 88.64 mL/min (ref >60.00)
Glucose, Bld: 112 mg/dL — ABNORMAL HIGH (ref 70–99)
Potassium: 5.1 mEq/L (ref 3.5–5.1)
Sodium: 140 mEq/L (ref 135–145)
Total Protein: 7.7 g/dL (ref 6.0–8.3)

## 2016-03-26 LAB — IBC PANEL
Iron: 41 ug/dL — ABNORMAL LOW (ref 42–165)
SATURATION RATIOS: 13.3 % — AB (ref 20.0–50.0)
TRANSFERRIN: 220 mg/dL (ref 212.0–360.0)

## 2016-03-26 MED ORDER — TRAMADOL HCL 50 MG PO TABS: 50.0000 mg | ORAL_TABLET | Freq: Three times a day (TID) | ORAL | 1 refills | Status: DC | PRN

## 2016-03-26 NOTE — Patient Instructions (Signed)
Go to the lab on the way out.  We'll contact you with your lab report. Don't change your meds for now.  Plan on recheck in 3 months, labs ahead of time.  Take care.  Glad to see you.

## 2016-03-26 NOTE — Progress Notes (Signed)
New patient.    Inpatient course d/w pt.    Admit date: 03/05/2016 Discharge date: 03/06/2016  PCP: Jene Every, MD  DISCHARGE DIAGNOSES:  Active Problems:   Urethral injury   Acute kidney injury ([place])   DM type 2 with diabetic peripheral neuropathy (HCC)   Benign essential HTN   Gastroenteritis   Dehydration   Pressure injury of skin   RECOMMENDATIONS FOR OUTPATIENT FOLLOW UP: 1. Patient to call his urologist office regarding his suprapubic catheter   DISCHARGE CONDITION: fair  Diet recommendation: As before      Filed Weights   03/05/16 1719 03/06/16 0024  Weight: 86.2 kg (190 lb) 86.3 kg (190 lb 4.8 oz)    INITIAL HISTORY: 75 year old Caucasian male with a past medical history of multiple pelvic fractures, status post sacroiliac screw fixation secondary to recent trauma, unilateral, status post suprapubic catheter placement, diabetes mellitus type 2, sacral wound, hypertension, presented with 2 day history of nausea, vomiting and diarrhea. He had decreased urine output. Patient was found to have acute renal failure. He was hospitalized for further management.   HOSPITAL COURSE:   Mild acute renal failure Most likely secondary to dehydration and hypovolemia from his GI symptomatology. Patient was given IV fluids. His renal function is back to baseline. He is making urine without any difficulty. Urine has become more clear.  Abnormal UA. Possible UTI. Nausea, vomiting Patient did have abnormal UA. Cultures are pending. He did have symptoms of nausea, vomiting and some abdominal pain as well. Differential diagnosis for his presentation includes either gastroenteritis or symptoms due to urinary tract infection. He may benefit from a course of antibiotics. He has not been on antibiotics recently. Will be discharged on Vantin. His nausea, vomiting. Symptoms have resolved. He had a formed stool this morning. He tolerated his diet.  History of urethral  injury, status post suprapubic catheter. Last changed on October 31. He will discuss this further with his urologist on Monday.  History of diabetes mellitus type 2 with diabetic peripheral neuropathy. Continue with his home medications.  History of benign essential hypertension. Stable. Continue with her medications.  Overall, patient has improved. He is very keen on going home today. Discussed with patient and his wife in detail. Okay for discharge.  -------------------------------------------------- Trapped/pinned with tractor accident 01/10/16.   Previous Hospital course discussed with patient.  Inpatient course d/w pt re: ARF in 02/2016.  See above   He is clearly debilitated from all of this. Prev baseline weight was 230 lbs.  196 lbs today.    Med list updated.   Recently done with vantin.   Unable to tolerate iron.  Nausea is better now.    Suprapubic catheter per urology.  No plan to take that down yet.  This was placed after his initial traumatic event  Sacral wound is improved but not healed. Still with [facility] treatment ongoing.    Still with discomfort, 4-5/10 pain.   Still using a walker.  Still with quad pain B and some R achilles swelling recently, with more walking.  Taking tramadol occ.  Tends to have more pain at night.    DM2. Usually his sugars are ~120 recently.  Eye exam 06/04/15.  PNA 23 12/19/2012.  PNA 13 01/03/2014.   His A1c is likely artificially low due to anemia. Discussed with patient.  Shingles shot done 12/19/2014.  Pneumococcal Conjugate 13-Valent 01/03/2014   Pneumococcal Polysaccharide 23 12/19/2012      Advance directive d/w pt.  Wife designated if  patient were incapacitated.    PMH and SH reviewed  ROS: Per HPI unless specifically indicated in ROS section   Meds, vitals, and allergies reviewed.   GEN: nad, alert and oriented HEENT: mucous membranes moist NECK: supple w/o LA CV: rrr. PULM: ctab, no inc wob ABD: soft, +bs EXT: trace  BLE  edema SKIN: Sacral wound noted, scabbed. No spreading erythema Suprapubic catheter noted  Diabetic foot exam: Normal inspection No skin breakdown calluses noted without ulceration Normal DP pulses Normal sensation to light touch spray decrease to monofilament testing noted  Nails normal

## 2016-03-26 NOTE — Progress Notes (Signed)
Pre visit review using our clinic review tool, if applicable. No additional management support is needed unless otherwise documented below in the visit note. 

## 2016-03-27 DIAGNOSIS — D62 Acute posthemorrhagic anemia: Secondary | ICD-10-CM | POA: Diagnosis not present

## 2016-03-27 DIAGNOSIS — L89153 Pressure ulcer of sacral region, stage 3: Secondary | ICD-10-CM | POA: Diagnosis not present

## 2016-03-27 DIAGNOSIS — S3730XD Unspecified injury of urethra, subsequent encounter: Secondary | ICD-10-CM | POA: Diagnosis not present

## 2016-03-27 DIAGNOSIS — S32501D Unspecified fracture of right pubis, subsequent encounter for fracture with routine healing: Secondary | ICD-10-CM | POA: Diagnosis not present

## 2016-03-27 DIAGNOSIS — E1142 Type 2 diabetes mellitus with diabetic polyneuropathy: Secondary | ICD-10-CM | POA: Diagnosis not present

## 2016-03-27 DIAGNOSIS — S32811D Multiple fractures of pelvis with unstable disruption of pelvic ring, subsequent encounter for fracture with routine healing: Secondary | ICD-10-CM | POA: Diagnosis not present

## 2016-03-27 LAB — CBC WITH DIFFERENTIAL/PLATELET
BASOS ABS: 0.1 10*3/uL (ref 0.0–0.1)
BASOS PCT: 0.6 % (ref 0.0–3.0)
EOS PCT: 3.5 % (ref 0.0–5.0)
Eosinophils Absolute: 0.3 10*3/uL (ref 0.0–0.7)
HEMATOCRIT: 38 % — AB (ref 39.0–52.0)
Hemoglobin: 12.5 g/dL — ABNORMAL LOW (ref 13.0–17.0)
LYMPHS PCT: 29.6 % (ref 12.0–46.0)
Lymphs Abs: 2.5 10*3/uL (ref 0.7–4.0)
MCHC: 32.9 g/dL (ref 30.0–36.0)
MCV: 83 fl (ref 78.0–100.0)
MONOS PCT: 6.5 % (ref 3.0–12.0)
Monocytes Absolute: 0.6 10*3/uL (ref 0.1–1.0)
NEUTROS ABS: 5.1 10*3/uL (ref 1.4–7.7)
Neutrophils Relative %: 59.8 % (ref 43.0–77.0)
PLATELETS: 261 10*3/uL (ref 150.0–400.0)
RBC: 4.58 Mil/uL (ref 4.22–5.81)
RDW: 16.7 % — ABNORMAL HIGH (ref 11.5–15.5)
WBC: 8.6 10*3/uL (ref 4.0–10.5)

## 2016-03-29 DIAGNOSIS — E1142 Type 2 diabetes mellitus with diabetic polyneuropathy: Secondary | ICD-10-CM | POA: Diagnosis not present

## 2016-03-29 DIAGNOSIS — S32501D Unspecified fracture of right pubis, subsequent encounter for fracture with routine healing: Secondary | ICD-10-CM | POA: Diagnosis not present

## 2016-03-29 DIAGNOSIS — S32811D Multiple fractures of pelvis with unstable disruption of pelvic ring, subsequent encounter for fracture with routine healing: Secondary | ICD-10-CM | POA: Diagnosis not present

## 2016-03-29 DIAGNOSIS — S3730XD Unspecified injury of urethra, subsequent encounter: Secondary | ICD-10-CM | POA: Diagnosis not present

## 2016-03-29 DIAGNOSIS — D62 Acute posthemorrhagic anemia: Secondary | ICD-10-CM | POA: Diagnosis not present

## 2016-03-29 DIAGNOSIS — L89153 Pressure ulcer of sacral region, stage 3: Secondary | ICD-10-CM | POA: Diagnosis not present

## 2016-03-29 NOTE — Assessment & Plan Note (Addendum)
Due for follow-up labs. See notes on labs. >30 minutes spent in face to face time with patient, >50% spent in counselling or coordination of care.

## 2016-03-29 NOTE — Assessment & Plan Note (Signed)
See notes on labs. 

## 2016-03-29 NOTE — Assessment & Plan Note (Signed)
Still with suprapubic catheter. He will follow-up with urology. He is gradually getting his strength back.

## 2016-03-29 NOTE — Assessment & Plan Note (Signed)
We can recheck periodically.

## 2016-03-31 DIAGNOSIS — S32501D Unspecified fracture of right pubis, subsequent encounter for fracture with routine healing: Secondary | ICD-10-CM | POA: Diagnosis not present

## 2016-03-31 DIAGNOSIS — D62 Acute posthemorrhagic anemia: Secondary | ICD-10-CM | POA: Diagnosis not present

## 2016-03-31 DIAGNOSIS — S32811D Multiple fractures of pelvis with unstable disruption of pelvic ring, subsequent encounter for fracture with routine healing: Secondary | ICD-10-CM | POA: Diagnosis not present

## 2016-03-31 DIAGNOSIS — L89153 Pressure ulcer of sacral region, stage 3: Secondary | ICD-10-CM | POA: Diagnosis not present

## 2016-03-31 DIAGNOSIS — E1142 Type 2 diabetes mellitus with diabetic polyneuropathy: Secondary | ICD-10-CM | POA: Diagnosis not present

## 2016-03-31 DIAGNOSIS — S3730XD Unspecified injury of urethra, subsequent encounter: Secondary | ICD-10-CM | POA: Diagnosis not present

## 2016-04-01 DIAGNOSIS — E1142 Type 2 diabetes mellitus with diabetic polyneuropathy: Secondary | ICD-10-CM | POA: Diagnosis not present

## 2016-04-01 DIAGNOSIS — S32811D Multiple fractures of pelvis with unstable disruption of pelvic ring, subsequent encounter for fracture with routine healing: Secondary | ICD-10-CM | POA: Diagnosis not present

## 2016-04-01 DIAGNOSIS — S32501D Unspecified fracture of right pubis, subsequent encounter for fracture with routine healing: Secondary | ICD-10-CM | POA: Diagnosis not present

## 2016-04-01 DIAGNOSIS — L89153 Pressure ulcer of sacral region, stage 3: Secondary | ICD-10-CM | POA: Diagnosis not present

## 2016-04-01 DIAGNOSIS — S3730XD Unspecified injury of urethra, subsequent encounter: Secondary | ICD-10-CM | POA: Diagnosis not present

## 2016-04-01 DIAGNOSIS — D62 Acute posthemorrhagic anemia: Secondary | ICD-10-CM | POA: Diagnosis not present

## 2016-04-02 DIAGNOSIS — E1142 Type 2 diabetes mellitus with diabetic polyneuropathy: Secondary | ICD-10-CM | POA: Diagnosis not present

## 2016-04-02 DIAGNOSIS — S32501D Unspecified fracture of right pubis, subsequent encounter for fracture with routine healing: Secondary | ICD-10-CM | POA: Diagnosis not present

## 2016-04-02 DIAGNOSIS — S32811D Multiple fractures of pelvis with unstable disruption of pelvic ring, subsequent encounter for fracture with routine healing: Secondary | ICD-10-CM | POA: Diagnosis not present

## 2016-04-02 DIAGNOSIS — L89153 Pressure ulcer of sacral region, stage 3: Secondary | ICD-10-CM | POA: Diagnosis not present

## 2016-04-02 DIAGNOSIS — S3730XD Unspecified injury of urethra, subsequent encounter: Secondary | ICD-10-CM | POA: Diagnosis not present

## 2016-04-02 DIAGNOSIS — D62 Acute posthemorrhagic anemia: Secondary | ICD-10-CM | POA: Diagnosis not present

## 2016-04-03 ENCOUNTER — Other Ambulatory Visit: Payer: Self-pay

## 2016-04-03 DIAGNOSIS — D62 Acute posthemorrhagic anemia: Secondary | ICD-10-CM | POA: Diagnosis not present

## 2016-04-03 DIAGNOSIS — S32501D Unspecified fracture of right pubis, subsequent encounter for fracture with routine healing: Secondary | ICD-10-CM | POA: Diagnosis not present

## 2016-04-03 DIAGNOSIS — S3730XD Unspecified injury of urethra, subsequent encounter: Secondary | ICD-10-CM | POA: Diagnosis not present

## 2016-04-03 DIAGNOSIS — L89153 Pressure ulcer of sacral region, stage 3: Secondary | ICD-10-CM | POA: Diagnosis not present

## 2016-04-03 DIAGNOSIS — E1142 Type 2 diabetes mellitus with diabetic polyneuropathy: Secondary | ICD-10-CM | POA: Diagnosis not present

## 2016-04-03 DIAGNOSIS — S32811D Multiple fractures of pelvis with unstable disruption of pelvic ring, subsequent encounter for fracture with routine healing: Secondary | ICD-10-CM | POA: Diagnosis not present

## 2016-04-03 MED ORDER — ATORVASTATIN CALCIUM 80 MG PO TABS: 80.0000 mg | ORAL_TABLET | Freq: Every day | ORAL | 3 refills | Status: DC

## 2016-04-03 NOTE — Telephone Encounter (Signed)
Sent in

## 2016-04-03 NOTE — Telephone Encounter (Signed)
Christian [name] left v/m;pt established care with Dr [name] on 03/26/16. Request refill atorvastatin to [facility]. In office note did not see Dr [name] discussing atorvastatin and pt did not have lipid labs.Please advise.

## 2016-04-07 DIAGNOSIS — S3730XD Unspecified injury of urethra, subsequent encounter: Secondary | ICD-10-CM | POA: Diagnosis not present

## 2016-04-07 DIAGNOSIS — S32501D Unspecified fracture of right pubis, subsequent encounter for fracture with routine healing: Secondary | ICD-10-CM | POA: Diagnosis not present

## 2016-04-07 DIAGNOSIS — S32811D Multiple fractures of pelvis with unstable disruption of pelvic ring, subsequent encounter for fracture with routine healing: Secondary | ICD-10-CM | POA: Diagnosis not present

## 2016-04-07 DIAGNOSIS — L89153 Pressure ulcer of sacral region, stage 3: Secondary | ICD-10-CM | POA: Diagnosis not present

## 2016-04-07 DIAGNOSIS — E1142 Type 2 diabetes mellitus with diabetic polyneuropathy: Secondary | ICD-10-CM | POA: Diagnosis not present

## 2016-04-07 DIAGNOSIS — D62 Acute posthemorrhagic anemia: Secondary | ICD-10-CM | POA: Diagnosis not present

## 2016-04-09 DIAGNOSIS — S32811D Multiple fractures of pelvis with unstable disruption of pelvic ring, subsequent encounter for fracture with routine healing: Secondary | ICD-10-CM | POA: Diagnosis not present

## 2016-04-09 DIAGNOSIS — D62 Acute posthemorrhagic anemia: Secondary | ICD-10-CM | POA: Diagnosis not present

## 2016-04-09 DIAGNOSIS — E1140 Type 2 diabetes mellitus with diabetic neuropathy, unspecified: Secondary | ICD-10-CM | POA: Diagnosis not present

## 2016-04-09 DIAGNOSIS — S3730XD Unspecified injury of urethra, subsequent encounter: Secondary | ICD-10-CM | POA: Diagnosis not present

## 2016-04-09 DIAGNOSIS — L89153 Pressure ulcer of sacral region, stage 3: Secondary | ICD-10-CM | POA: Diagnosis not present

## 2016-04-09 DIAGNOSIS — E1142 Type 2 diabetes mellitus with diabetic polyneuropathy: Secondary | ICD-10-CM | POA: Diagnosis not present

## 2016-04-09 DIAGNOSIS — Z794 Long term (current) use of insulin: Secondary | ICD-10-CM | POA: Diagnosis not present

## 2016-04-09 DIAGNOSIS — I10 Essential (primary) hypertension: Secondary | ICD-10-CM | POA: Diagnosis not present

## 2016-04-09 DIAGNOSIS — S32501D Unspecified fracture of right pubis, subsequent encounter for fracture with routine healing: Secondary | ICD-10-CM | POA: Diagnosis not present

## 2016-04-09 DIAGNOSIS — L98492 Non-pressure chronic ulcer of skin of other sites with fat layer exposed: Secondary | ICD-10-CM | POA: Diagnosis not present

## 2016-04-09 DIAGNOSIS — L98423 Non-pressure chronic ulcer of back with necrosis of muscle: Secondary | ICD-10-CM | POA: Diagnosis not present

## 2016-04-13 DIAGNOSIS — Z9359 Other cystostomy status: Secondary | ICD-10-CM | POA: Diagnosis not present

## 2016-04-13 DIAGNOSIS — S3730XD Unspecified injury of urethra, subsequent encounter: Secondary | ICD-10-CM | POA: Diagnosis not present

## 2016-04-13 DIAGNOSIS — D62 Acute posthemorrhagic anemia: Secondary | ICD-10-CM | POA: Diagnosis not present

## 2016-04-13 DIAGNOSIS — L89153 Pressure ulcer of sacral region, stage 3: Secondary | ICD-10-CM | POA: Diagnosis not present

## 2016-04-13 DIAGNOSIS — S32811D Multiple fractures of pelvis with unstable disruption of pelvic ring, subsequent encounter for fracture with routine healing: Secondary | ICD-10-CM | POA: Diagnosis not present

## 2016-04-13 DIAGNOSIS — Z7984 Long term (current) use of oral hypoglycemic drugs: Secondary | ICD-10-CM | POA: Diagnosis not present

## 2016-04-13 DIAGNOSIS — E785 Hyperlipidemia, unspecified: Secondary | ICD-10-CM | POA: Diagnosis not present

## 2016-04-13 DIAGNOSIS — S32501D Unspecified fracture of right pubis, subsequent encounter for fracture with routine healing: Secondary | ICD-10-CM | POA: Diagnosis not present

## 2016-04-13 DIAGNOSIS — I10 Essential (primary) hypertension: Secondary | ICD-10-CM | POA: Diagnosis not present

## 2016-04-13 DIAGNOSIS — E1142 Type 2 diabetes mellitus with diabetic polyneuropathy: Secondary | ICD-10-CM | POA: Diagnosis not present

## 2016-04-13 DIAGNOSIS — Z794 Long term (current) use of insulin: Secondary | ICD-10-CM | POA: Diagnosis not present

## 2016-04-15 DIAGNOSIS — S32501D Unspecified fracture of right pubis, subsequent encounter for fracture with routine healing: Secondary | ICD-10-CM | POA: Diagnosis not present

## 2016-04-15 DIAGNOSIS — L89153 Pressure ulcer of sacral region, stage 3: Secondary | ICD-10-CM | POA: Diagnosis not present

## 2016-04-15 DIAGNOSIS — S3730XD Unspecified injury of urethra, subsequent encounter: Secondary | ICD-10-CM | POA: Diagnosis not present

## 2016-04-15 DIAGNOSIS — D62 Acute posthemorrhagic anemia: Secondary | ICD-10-CM | POA: Diagnosis not present

## 2016-04-15 DIAGNOSIS — S32811D Multiple fractures of pelvis with unstable disruption of pelvic ring, subsequent encounter for fracture with routine healing: Secondary | ICD-10-CM | POA: Diagnosis not present

## 2016-04-15 DIAGNOSIS — E1142 Type 2 diabetes mellitus with diabetic polyneuropathy: Secondary | ICD-10-CM | POA: Diagnosis not present

## 2016-04-16 DIAGNOSIS — S32501D Unspecified fracture of right pubis, subsequent encounter for fracture with routine healing: Secondary | ICD-10-CM | POA: Diagnosis not present

## 2016-04-16 DIAGNOSIS — L89153 Pressure ulcer of sacral region, stage 3: Secondary | ICD-10-CM | POA: Diagnosis not present

## 2016-04-16 DIAGNOSIS — S3730XD Unspecified injury of urethra, subsequent encounter: Secondary | ICD-10-CM | POA: Diagnosis not present

## 2016-04-16 DIAGNOSIS — D62 Acute posthemorrhagic anemia: Secondary | ICD-10-CM | POA: Diagnosis not present

## 2016-04-16 DIAGNOSIS — E1142 Type 2 diabetes mellitus with diabetic polyneuropathy: Secondary | ICD-10-CM | POA: Diagnosis not present

## 2016-04-16 DIAGNOSIS — S32811D Multiple fractures of pelvis with unstable disruption of pelvic ring, subsequent encounter for fracture with routine healing: Secondary | ICD-10-CM | POA: Diagnosis not present

## 2016-04-17 ENCOUNTER — Encounter (HOSPITAL_BASED_OUTPATIENT_CLINIC_OR_DEPARTMENT_OTHER): Payer: Medicare Other | Attending: Internal Medicine

## 2016-04-17 DIAGNOSIS — L98423 Non-pressure chronic ulcer of back with necrosis of muscle: Secondary | ICD-10-CM | POA: Diagnosis not present

## 2016-04-17 DIAGNOSIS — S31000A Unspecified open wound of lower back and pelvis without penetration into retroperitoneum, initial encounter: Secondary | ICD-10-CM | POA: Diagnosis not present

## 2016-04-17 DIAGNOSIS — L98422 Non-pressure chronic ulcer of back with fat layer exposed: Secondary | ICD-10-CM | POA: Insufficient documentation

## 2016-04-20 DIAGNOSIS — D62 Acute posthemorrhagic anemia: Secondary | ICD-10-CM | POA: Diagnosis not present

## 2016-04-20 DIAGNOSIS — L89153 Pressure ulcer of sacral region, stage 3: Secondary | ICD-10-CM | POA: Diagnosis not present

## 2016-04-20 DIAGNOSIS — E1142 Type 2 diabetes mellitus with diabetic polyneuropathy: Secondary | ICD-10-CM | POA: Diagnosis not present

## 2016-04-20 DIAGNOSIS — S32811D Multiple fractures of pelvis with unstable disruption of pelvic ring, subsequent encounter for fracture with routine healing: Secondary | ICD-10-CM | POA: Diagnosis not present

## 2016-04-20 DIAGNOSIS — S32501D Unspecified fracture of right pubis, subsequent encounter for fracture with routine healing: Secondary | ICD-10-CM | POA: Diagnosis not present

## 2016-04-20 DIAGNOSIS — S3730XD Unspecified injury of urethra, subsequent encounter: Secondary | ICD-10-CM | POA: Diagnosis not present

## 2016-04-22 DIAGNOSIS — S3730XD Unspecified injury of urethra, subsequent encounter: Secondary | ICD-10-CM | POA: Diagnosis not present

## 2016-04-22 DIAGNOSIS — L89153 Pressure ulcer of sacral region, stage 3: Secondary | ICD-10-CM | POA: Diagnosis not present

## 2016-04-22 DIAGNOSIS — D62 Acute posthemorrhagic anemia: Secondary | ICD-10-CM | POA: Diagnosis not present

## 2016-04-22 DIAGNOSIS — E1142 Type 2 diabetes mellitus with diabetic polyneuropathy: Secondary | ICD-10-CM | POA: Diagnosis not present

## 2016-04-22 DIAGNOSIS — S32501D Unspecified fracture of right pubis, subsequent encounter for fracture with routine healing: Secondary | ICD-10-CM | POA: Diagnosis not present

## 2016-04-22 DIAGNOSIS — S32811D Multiple fractures of pelvis with unstable disruption of pelvic ring, subsequent encounter for fracture with routine healing: Secondary | ICD-10-CM | POA: Diagnosis not present

## 2016-04-24 ENCOUNTER — Telehealth: Payer: Self-pay

## 2016-04-24 ENCOUNTER — Other Ambulatory Visit: Payer: Self-pay | Admitting: *Deleted

## 2016-04-24 DIAGNOSIS — S32501D Unspecified fracture of right pubis, subsequent encounter for fracture with routine healing: Secondary | ICD-10-CM | POA: Diagnosis not present

## 2016-04-24 DIAGNOSIS — E1142 Type 2 diabetes mellitus with diabetic polyneuropathy: Secondary | ICD-10-CM | POA: Diagnosis not present

## 2016-04-24 DIAGNOSIS — S32811D Multiple fractures of pelvis with unstable disruption of pelvic ring, subsequent encounter for fracture with routine healing: Secondary | ICD-10-CM | POA: Diagnosis not present

## 2016-04-24 DIAGNOSIS — D62 Acute posthemorrhagic anemia: Secondary | ICD-10-CM | POA: Diagnosis not present

## 2016-04-24 DIAGNOSIS — L98423 Non-pressure chronic ulcer of back with necrosis of muscle: Secondary | ICD-10-CM | POA: Diagnosis not present

## 2016-04-24 DIAGNOSIS — L89153 Pressure ulcer of sacral region, stage 3: Secondary | ICD-10-CM | POA: Diagnosis not present

## 2016-04-24 DIAGNOSIS — S3730XD Unspecified injury of urethra, subsequent encounter: Secondary | ICD-10-CM | POA: Diagnosis not present

## 2016-04-24 DIAGNOSIS — L98422 Non-pressure chronic ulcer of back with fat layer exposed: Secondary | ICD-10-CM | POA: Diagnosis not present

## 2016-04-24 MED ORDER — METFORMIN HCL 1000 MG PO TABS: 1000.0000 mg | ORAL_TABLET | Freq: Two times a day (BID) | ORAL | 1 refills | Status: DC

## 2016-04-24 MED ORDER — PANTOPRAZOLE SODIUM 40 MG PO TBEC: 40.0000 mg | DELAYED_RELEASE_TABLET | Freq: Two times a day (BID) | ORAL | 1 refills | Status: DC

## 2016-04-24 NOTE — Telephone Encounter (Signed)
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 Once a week for four weeks. Verbal order has been given to [facility]

## 2016-04-27 DIAGNOSIS — L89153 Pressure ulcer of sacral region, stage 3: Secondary | ICD-10-CM | POA: Diagnosis not present

## 2016-04-27 DIAGNOSIS — E1142 Type 2 diabetes mellitus with diabetic polyneuropathy: Secondary | ICD-10-CM | POA: Diagnosis not present

## 2016-04-27 DIAGNOSIS — S32501D Unspecified fracture of right pubis, subsequent encounter for fracture with routine healing: Secondary | ICD-10-CM | POA: Diagnosis not present

## 2016-04-27 DIAGNOSIS — S32811D Multiple fractures of pelvis with unstable disruption of pelvic ring, subsequent encounter for fracture with routine healing: Secondary | ICD-10-CM | POA: Diagnosis not present

## 2016-04-27 DIAGNOSIS — S3730XD Unspecified injury of urethra, subsequent encounter: Secondary | ICD-10-CM | POA: Diagnosis not present

## 2016-04-27 DIAGNOSIS — D62 Acute posthemorrhagic anemia: Secondary | ICD-10-CM | POA: Diagnosis not present

## 2016-04-28 DIAGNOSIS — S32501D Unspecified fracture of right pubis, subsequent encounter for fracture with routine healing: Secondary | ICD-10-CM | POA: Diagnosis not present

## 2016-04-28 DIAGNOSIS — S3730XD Unspecified injury of urethra, subsequent encounter: Secondary | ICD-10-CM | POA: Diagnosis not present

## 2016-04-28 DIAGNOSIS — E1142 Type 2 diabetes mellitus with diabetic polyneuropathy: Secondary | ICD-10-CM | POA: Diagnosis not present

## 2016-04-28 DIAGNOSIS — L89153 Pressure ulcer of sacral region, stage 3: Secondary | ICD-10-CM | POA: Diagnosis not present

## 2016-04-28 DIAGNOSIS — S32811D Multiple fractures of pelvis with unstable disruption of pelvic ring, subsequent encounter for fracture with routine healing: Secondary | ICD-10-CM | POA: Diagnosis not present

## 2016-04-28 DIAGNOSIS — D62 Acute posthemorrhagic anemia: Secondary | ICD-10-CM | POA: Diagnosis not present

## 2016-04-29 DIAGNOSIS — S32811D Multiple fractures of pelvis with unstable disruption of pelvic ring, subsequent encounter for fracture with routine healing: Secondary | ICD-10-CM | POA: Diagnosis not present

## 2016-04-29 DIAGNOSIS — S3730XD Unspecified injury of urethra, subsequent encounter: Secondary | ICD-10-CM | POA: Diagnosis not present

## 2016-04-29 DIAGNOSIS — D62 Acute posthemorrhagic anemia: Secondary | ICD-10-CM | POA: Diagnosis not present

## 2016-04-29 DIAGNOSIS — L89153 Pressure ulcer of sacral region, stage 3: Secondary | ICD-10-CM | POA: Diagnosis not present

## 2016-04-29 DIAGNOSIS — E1142 Type 2 diabetes mellitus with diabetic polyneuropathy: Secondary | ICD-10-CM | POA: Diagnosis not present

## 2016-04-29 DIAGNOSIS — S32501D Unspecified fracture of right pubis, subsequent encounter for fracture with routine healing: Secondary | ICD-10-CM | POA: Diagnosis not present

## 2016-04-30 DIAGNOSIS — S3730XD Unspecified injury of urethra, subsequent encounter: Secondary | ICD-10-CM | POA: Diagnosis not present

## 2016-04-30 DIAGNOSIS — E1142 Type 2 diabetes mellitus with diabetic polyneuropathy: Secondary | ICD-10-CM | POA: Diagnosis not present

## 2016-04-30 DIAGNOSIS — D62 Acute posthemorrhagic anemia: Secondary | ICD-10-CM | POA: Diagnosis not present

## 2016-04-30 DIAGNOSIS — S32811D Multiple fractures of pelvis with unstable disruption of pelvic ring, subsequent encounter for fracture with routine healing: Secondary | ICD-10-CM | POA: Diagnosis not present

## 2016-04-30 DIAGNOSIS — L89153 Pressure ulcer of sacral region, stage 3: Secondary | ICD-10-CM | POA: Diagnosis not present

## 2016-04-30 DIAGNOSIS — S32501D Unspecified fracture of right pubis, subsequent encounter for fracture with routine healing: Secondary | ICD-10-CM | POA: Diagnosis not present

## 2016-05-01 DIAGNOSIS — S32501D Unspecified fracture of right pubis, subsequent encounter for fracture with routine healing: Secondary | ICD-10-CM | POA: Diagnosis not present

## 2016-05-01 DIAGNOSIS — E1142 Type 2 diabetes mellitus with diabetic polyneuropathy: Secondary | ICD-10-CM | POA: Diagnosis not present

## 2016-05-01 DIAGNOSIS — S32811D Multiple fractures of pelvis with unstable disruption of pelvic ring, subsequent encounter for fracture with routine healing: Secondary | ICD-10-CM | POA: Diagnosis not present

## 2016-05-01 DIAGNOSIS — D62 Acute posthemorrhagic anemia: Secondary | ICD-10-CM | POA: Diagnosis not present

## 2016-05-01 DIAGNOSIS — S3730XD Unspecified injury of urethra, subsequent encounter: Secondary | ICD-10-CM | POA: Diagnosis not present

## 2016-05-01 DIAGNOSIS — L89153 Pressure ulcer of sacral region, stage 3: Secondary | ICD-10-CM | POA: Diagnosis not present

## 2016-05-04 DIAGNOSIS — L89153 Pressure ulcer of sacral region, stage 3: Secondary | ICD-10-CM | POA: Diagnosis not present

## 2016-05-04 DIAGNOSIS — S32811D Multiple fractures of pelvis with unstable disruption of pelvic ring, subsequent encounter for fracture with routine healing: Secondary | ICD-10-CM | POA: Diagnosis not present

## 2016-05-04 DIAGNOSIS — S32501D Unspecified fracture of right pubis, subsequent encounter for fracture with routine healing: Secondary | ICD-10-CM | POA: Diagnosis not present

## 2016-05-04 DIAGNOSIS — D62 Acute posthemorrhagic anemia: Secondary | ICD-10-CM | POA: Diagnosis not present

## 2016-05-04 DIAGNOSIS — S3730XD Unspecified injury of urethra, subsequent encounter: Secondary | ICD-10-CM | POA: Diagnosis not present

## 2016-05-04 DIAGNOSIS — E1142 Type 2 diabetes mellitus with diabetic polyneuropathy: Secondary | ICD-10-CM | POA: Diagnosis not present

## 2016-05-05 ENCOUNTER — Other Ambulatory Visit: Payer: Self-pay | Admitting: *Deleted

## 2016-05-05 DIAGNOSIS — S3730XD Unspecified injury of urethra, subsequent encounter: Secondary | ICD-10-CM | POA: Diagnosis not present

## 2016-05-05 DIAGNOSIS — E1142 Type 2 diabetes mellitus with diabetic polyneuropathy: Secondary | ICD-10-CM | POA: Diagnosis not present

## 2016-05-05 DIAGNOSIS — L89153 Pressure ulcer of sacral region, stage 3: Secondary | ICD-10-CM | POA: Diagnosis not present

## 2016-05-05 DIAGNOSIS — Q542 Hypospadias, penoscrotal: Secondary | ICD-10-CM | POA: Diagnosis not present

## 2016-05-05 DIAGNOSIS — D62 Acute posthemorrhagic anemia: Secondary | ICD-10-CM | POA: Diagnosis not present

## 2016-05-05 DIAGNOSIS — S32501D Unspecified fracture of right pubis, subsequent encounter for fracture with routine healing: Secondary | ICD-10-CM | POA: Diagnosis not present

## 2016-05-05 DIAGNOSIS — S32811D Multiple fractures of pelvis with unstable disruption of pelvic ring, subsequent encounter for fracture with routine healing: Secondary | ICD-10-CM | POA: Diagnosis not present

## 2016-05-05 MED ORDER — AMLODIPINE BESYLATE 10 MG PO TABS: 10.0000 mg | ORAL_TABLET | Freq: Every day | ORAL | 5 refills | Status: DC

## 2016-05-05 NOTE — Telephone Encounter (Signed)
Spoke to patient's wife and was advised that patient needs a refill sent to pharmacy for Amlodipine. Refill sent to [facility] as requested by patient's wife.

## 2016-05-06 DIAGNOSIS — S3730XD Unspecified injury of urethra, subsequent encounter: Secondary | ICD-10-CM | POA: Diagnosis not present

## 2016-05-06 DIAGNOSIS — E1142 Type 2 diabetes mellitus with diabetic polyneuropathy: Secondary | ICD-10-CM | POA: Diagnosis not present

## 2016-05-06 DIAGNOSIS — S32811D Multiple fractures of pelvis with unstable disruption of pelvic ring, subsequent encounter for fracture with routine healing: Secondary | ICD-10-CM | POA: Diagnosis not present

## 2016-05-06 DIAGNOSIS — S32501D Unspecified fracture of right pubis, subsequent encounter for fracture with routine healing: Secondary | ICD-10-CM | POA: Diagnosis not present

## 2016-05-06 DIAGNOSIS — D62 Acute posthemorrhagic anemia: Secondary | ICD-10-CM | POA: Diagnosis not present

## 2016-05-06 DIAGNOSIS — L89153 Pressure ulcer of sacral region, stage 3: Secondary | ICD-10-CM | POA: Diagnosis not present

## 2016-05-07 DIAGNOSIS — L98422 Non-pressure chronic ulcer of back with fat layer exposed: Secondary | ICD-10-CM | POA: Diagnosis not present

## 2016-05-07 DIAGNOSIS — S32811D Multiple fractures of pelvis with unstable disruption of pelvic ring, subsequent encounter for fracture with routine healing: Secondary | ICD-10-CM | POA: Diagnosis not present

## 2016-05-07 DIAGNOSIS — L89153 Pressure ulcer of sacral region, stage 3: Secondary | ICD-10-CM | POA: Diagnosis not present

## 2016-05-07 DIAGNOSIS — L98423 Non-pressure chronic ulcer of back with necrosis of muscle: Secondary | ICD-10-CM | POA: Diagnosis not present

## 2016-05-07 DIAGNOSIS — S3730XD Unspecified injury of urethra, subsequent encounter: Secondary | ICD-10-CM | POA: Diagnosis not present

## 2016-05-07 DIAGNOSIS — E1142 Type 2 diabetes mellitus with diabetic polyneuropathy: Secondary | ICD-10-CM | POA: Diagnosis not present

## 2016-05-07 DIAGNOSIS — S32501D Unspecified fracture of right pubis, subsequent encounter for fracture with routine healing: Secondary | ICD-10-CM | POA: Diagnosis not present

## 2016-05-07 DIAGNOSIS — D62 Acute posthemorrhagic anemia: Secondary | ICD-10-CM | POA: Diagnosis not present

## 2016-05-07 DIAGNOSIS — S381XXD Crushing injury of abdomen, lower back, and pelvis, subsequent encounter: Secondary | ICD-10-CM | POA: Diagnosis not present

## 2016-05-08 DIAGNOSIS — S3730XD Unspecified injury of urethra, subsequent encounter: Secondary | ICD-10-CM | POA: Diagnosis not present

## 2016-05-08 DIAGNOSIS — E1142 Type 2 diabetes mellitus with diabetic polyneuropathy: Secondary | ICD-10-CM | POA: Diagnosis not present

## 2016-05-08 DIAGNOSIS — S32501D Unspecified fracture of right pubis, subsequent encounter for fracture with routine healing: Secondary | ICD-10-CM | POA: Diagnosis not present

## 2016-05-08 DIAGNOSIS — D62 Acute posthemorrhagic anemia: Secondary | ICD-10-CM | POA: Diagnosis not present

## 2016-05-08 DIAGNOSIS — L89153 Pressure ulcer of sacral region, stage 3: Secondary | ICD-10-CM | POA: Diagnosis not present

## 2016-05-08 DIAGNOSIS — S32811D Multiple fractures of pelvis with unstable disruption of pelvic ring, subsequent encounter for fracture with routine healing: Secondary | ICD-10-CM | POA: Diagnosis not present

## 2016-05-11 ENCOUNTER — Ambulatory Visit (HOSPITAL_COMMUNITY)
Admission: RE | Admit: 2016-05-11 | Discharge: 2016-05-11 | Disposition: A | Discharge status: Home / Self Care | Payer: Medicare Other | Source: Ambulatory Visit | Attending: Urology | Admitting: Urology

## 2016-05-11 ENCOUNTER — Encounter (HOSPITAL_COMMUNITY): Payer: Self-pay | Admitting: Interventional Radiology

## 2016-05-11 ENCOUNTER — Other Ambulatory Visit (HOSPITAL_COMMUNITY): Payer: Self-pay | Admitting: Urology

## 2016-05-11 DIAGNOSIS — Z435 Encounter for attention to cystostomy: Secondary | ICD-10-CM | POA: Diagnosis not present

## 2016-05-11 DIAGNOSIS — N35014 Post-traumatic urethral stricture, male, unspecified: Secondary | ICD-10-CM | POA: Diagnosis not present

## 2016-05-11 DIAGNOSIS — Z466 Encounter for fitting and adjustment of urinary device: Secondary | ICD-10-CM | POA: Diagnosis not present

## 2016-05-11 DIAGNOSIS — D62 Acute posthemorrhagic anemia: Secondary | ICD-10-CM | POA: Diagnosis not present

## 2016-05-11 DIAGNOSIS — E1142 Type 2 diabetes mellitus with diabetic polyneuropathy: Secondary | ICD-10-CM | POA: Diagnosis not present

## 2016-05-11 DIAGNOSIS — L89153 Pressure ulcer of sacral region, stage 3: Secondary | ICD-10-CM | POA: Diagnosis not present

## 2016-05-11 DIAGNOSIS — S3730XD Unspecified injury of urethra, subsequent encounter: Secondary | ICD-10-CM | POA: Diagnosis not present

## 2016-05-11 DIAGNOSIS — S32501D Unspecified fracture of right pubis, subsequent encounter for fracture with routine healing: Secondary | ICD-10-CM | POA: Diagnosis not present

## 2016-05-11 DIAGNOSIS — S32811D Multiple fractures of pelvis with unstable disruption of pelvic ring, subsequent encounter for fracture with routine healing: Secondary | ICD-10-CM | POA: Diagnosis not present

## 2016-05-11 HISTORY — PX: IR GENERIC HISTORICAL: IMG1180011

## 2016-05-11 MED ORDER — IOPAMIDOL (ISOVUE-300) INJECTION 61%
50.0000 mL | Freq: Once | INTRAVENOUS | Status: AC | PRN
  Administered 2016-05-11: 10 mL via INTRAVENOUS

## 2016-05-11 MED ORDER — LIDOCAINE HCL 1 % IJ SOLN
INTRAMUSCULAR | Status: AC
  Filled 2016-05-11: qty 20

## 2016-05-11 MED ORDER — IOPAMIDOL (ISOVUE-300) INJECTION 61%
INTRAVENOUS | Status: AC
  Administered 2016-05-11: 10 mL via INTRAVENOUS
  Filled 2016-05-11: qty 50

## 2016-05-12 DIAGNOSIS — L89153 Pressure ulcer of sacral region, stage 3: Secondary | ICD-10-CM | POA: Diagnosis not present

## 2016-05-12 DIAGNOSIS — E1142 Type 2 diabetes mellitus with diabetic polyneuropathy: Secondary | ICD-10-CM | POA: Diagnosis not present

## 2016-05-12 DIAGNOSIS — D62 Acute posthemorrhagic anemia: Secondary | ICD-10-CM | POA: Diagnosis not present

## 2016-05-12 DIAGNOSIS — S32811D Multiple fractures of pelvis with unstable disruption of pelvic ring, subsequent encounter for fracture with routine healing: Secondary | ICD-10-CM | POA: Diagnosis not present

## 2016-05-12 DIAGNOSIS — S32501D Unspecified fracture of right pubis, subsequent encounter for fracture with routine healing: Secondary | ICD-10-CM | POA: Diagnosis not present

## 2016-05-12 DIAGNOSIS — S3730XD Unspecified injury of urethra, subsequent encounter: Secondary | ICD-10-CM | POA: Diagnosis not present

## 2016-05-13 DIAGNOSIS — S32811D Multiple fractures of pelvis with unstable disruption of pelvic ring, subsequent encounter for fracture with routine healing: Secondary | ICD-10-CM | POA: Diagnosis not present

## 2016-05-13 DIAGNOSIS — D62 Acute posthemorrhagic anemia: Secondary | ICD-10-CM | POA: Diagnosis not present

## 2016-05-13 DIAGNOSIS — S32501D Unspecified fracture of right pubis, subsequent encounter for fracture with routine healing: Secondary | ICD-10-CM | POA: Diagnosis not present

## 2016-05-13 DIAGNOSIS — L89153 Pressure ulcer of sacral region, stage 3: Secondary | ICD-10-CM | POA: Diagnosis not present

## 2016-05-13 DIAGNOSIS — E1142 Type 2 diabetes mellitus with diabetic polyneuropathy: Secondary | ICD-10-CM | POA: Diagnosis not present

## 2016-05-13 DIAGNOSIS — S3730XD Unspecified injury of urethra, subsequent encounter: Secondary | ICD-10-CM | POA: Diagnosis not present

## 2016-05-14 DIAGNOSIS — E1142 Type 2 diabetes mellitus with diabetic polyneuropathy: Secondary | ICD-10-CM | POA: Diagnosis not present

## 2016-05-14 DIAGNOSIS — S32501D Unspecified fracture of right pubis, subsequent encounter for fracture with routine healing: Secondary | ICD-10-CM | POA: Diagnosis not present

## 2016-05-14 DIAGNOSIS — S3730XD Unspecified injury of urethra, subsequent encounter: Secondary | ICD-10-CM | POA: Diagnosis not present

## 2016-05-14 DIAGNOSIS — D62 Acute posthemorrhagic anemia: Secondary | ICD-10-CM | POA: Diagnosis not present

## 2016-05-14 DIAGNOSIS — S32811D Multiple fractures of pelvis with unstable disruption of pelvic ring, subsequent encounter for fracture with routine healing: Secondary | ICD-10-CM | POA: Diagnosis not present

## 2016-05-14 DIAGNOSIS — L89153 Pressure ulcer of sacral region, stage 3: Secondary | ICD-10-CM | POA: Diagnosis not present

## 2016-05-15 DIAGNOSIS — S32501D Unspecified fracture of right pubis, subsequent encounter for fracture with routine healing: Secondary | ICD-10-CM | POA: Diagnosis not present

## 2016-05-15 DIAGNOSIS — L89153 Pressure ulcer of sacral region, stage 3: Secondary | ICD-10-CM | POA: Diagnosis not present

## 2016-05-15 DIAGNOSIS — S3730XD Unspecified injury of urethra, subsequent encounter: Secondary | ICD-10-CM | POA: Diagnosis not present

## 2016-05-15 DIAGNOSIS — D62 Acute posthemorrhagic anemia: Secondary | ICD-10-CM | POA: Diagnosis not present

## 2016-05-15 DIAGNOSIS — S32811D Multiple fractures of pelvis with unstable disruption of pelvic ring, subsequent encounter for fracture with routine healing: Secondary | ICD-10-CM | POA: Diagnosis not present

## 2016-05-15 DIAGNOSIS — E1142 Type 2 diabetes mellitus with diabetic polyneuropathy: Secondary | ICD-10-CM | POA: Diagnosis not present

## 2016-05-17 ENCOUNTER — Encounter (HOSPITAL_COMMUNITY): Payer: Self-pay | Admitting: Oncology

## 2016-05-17 ENCOUNTER — Emergency Department (HOSPITAL_COMMUNITY)
Admission: EM | Admit: 2016-05-17 | Discharge: 2016-05-17 | Disposition: A | Discharge status: Home / Self Care | Payer: Medicare Other | Attending: Emergency Medicine | Admitting: Emergency Medicine

## 2016-05-17 DIAGNOSIS — Z79899 Other long term (current) drug therapy: Secondary | ICD-10-CM | POA: Diagnosis not present

## 2016-05-17 DIAGNOSIS — I10 Essential (primary) hypertension: Secondary | ICD-10-CM | POA: Insufficient documentation

## 2016-05-17 DIAGNOSIS — Z794 Long term (current) use of insulin: Secondary | ICD-10-CM | POA: Diagnosis not present

## 2016-05-17 DIAGNOSIS — E1140 Type 2 diabetes mellitus with diabetic neuropathy, unspecified: Secondary | ICD-10-CM | POA: Insufficient documentation

## 2016-05-17 DIAGNOSIS — Y733 Surgical instruments, materials and gastroenterology and urology devices (including sutures) associated with adverse incidents: Secondary | ICD-10-CM | POA: Insufficient documentation

## 2016-05-17 DIAGNOSIS — T83098A Other mechanical complication of other indwelling urethral catheter, initial encounter: Secondary | ICD-10-CM | POA: Diagnosis not present

## 2016-05-17 DIAGNOSIS — R339 Retention of urine, unspecified: Secondary | ICD-10-CM | POA: Diagnosis present

## 2016-05-17 DIAGNOSIS — N390 Urinary tract infection, site not specified: Secondary | ICD-10-CM

## 2016-05-17 DIAGNOSIS — T83511A Infection and inflammatory reaction due to indwelling urethral catheter, initial encounter: Secondary | ICD-10-CM

## 2016-05-17 DIAGNOSIS — T839XXA Unspecified complication of genitourinary prosthetic device, implant and graft, initial encounter: Secondary | ICD-10-CM

## 2016-05-17 LAB — URINALYSIS, ROUTINE W REFLEX MICROSCOPIC
Bilirubin Urine: NEGATIVE
GLUCOSE, UA: NEGATIVE mg/dL
HGB URINE DIPSTICK: NEGATIVE
Ketones, ur: NEGATIVE mg/dL
NITRITE: NEGATIVE
PH: 5 (ref 5.0–8.0)
Protein, ur: NEGATIVE mg/dL
SPECIFIC GRAVITY, URINE: 1.014 (ref 1.005–1.030)

## 2016-05-17 LAB — CBC WITH DIFFERENTIAL/PLATELET
BASOS ABS: 0 10*3/uL (ref 0.0–0.1)
BASOS PCT: 0 %
EOS PCT: 2 %
Eosinophils Absolute: 0.1 10*3/uL (ref 0.0–0.7)
HCT: 35.6 % — ABNORMAL LOW (ref 39.0–52.0)
Hemoglobin: 11.5 g/dL — ABNORMAL LOW (ref 13.0–17.0)
LYMPHS PCT: 17 %
Lymphs Abs: 1.5 10*3/uL (ref 0.7–4.0)
MCH: 26.5 pg (ref 26.0–34.0)
MCHC: 32.3 g/dL (ref 30.0–36.0)
MCV: 82 fL (ref 78.0–100.0)
Monocytes Absolute: 0.6 10*3/uL (ref 0.1–1.0)
Monocytes Relative: 6 %
Neutro Abs: 6.7 10*3/uL (ref 1.7–7.7)
Neutrophils Relative %: 75 %
PLATELETS: 231 10*3/uL (ref 150–400)
RBC: 4.34 MIL/uL (ref 4.22–5.81)
RDW: 17.5 % — ABNORMAL HIGH (ref 11.5–15.5)
WBC: 8.9 10*3/uL (ref 4.0–10.5)

## 2016-05-17 LAB — BASIC METABOLIC PANEL
ANION GAP: 13 (ref 5–15)
BUN: 15 mg/dL (ref 6–20)
CO2: 23 mmol/L (ref 22–32)
Calcium: 9.1 mg/dL (ref 8.9–10.3)
Chloride: 105 mmol/L (ref 101–111)
Creatinine, Ser: 0.92 mg/dL (ref 0.61–1.24)
GFR calc Af Amer: >60 mL/min (ref >60)
Glucose, Bld: 151 mg/dL — ABNORMAL HIGH (ref 65–99)
POTASSIUM: 4 mmol/L (ref 3.5–5.1)
SODIUM: 141 mmol/L (ref 135–145)

## 2016-05-17 MED ORDER — CEPHALEXIN 500 MG PO CAPS: 500.0000 mg | ORAL_CAPSULE | Freq: Two times a day (BID) | ORAL | 0 refills | Status: AC

## 2016-05-17 MED ORDER — CEPHALEXIN 500 MG PO CAPS
500.0000 mg | ORAL_CAPSULE | Freq: Once | ORAL | Status: AC
  Administered 2016-05-17: 500 mg via ORAL
  Filled 2016-05-17: qty 1

## 2016-05-17 NOTE — ED Provider Notes (Signed)
[facility] DEPT Provider Note   CSN: [id number] Arrival date & time: 05/17/16  0529     History   Chief Complaint Chief Complaint  Patient presents with  . Urinary Retention    HPI Christian [name] is a 76 y.o. male.  HPI  76 year old male with a history of a suprapubic catheter after a tractor landed on him in September 2017 presents with a blocked catheter and urinary retention/abdominal pain. States he's been having decreased urinary output over the last 24 hours. After this started he had a couple episodes of diarrhea. Since last night around 8 PM he has had minimal to no urine output. The leg bag has been darker. He has not had fevers, vomiting, or back pain. Has never had this before. This particular catheter was most recently replaced on 05/11/16. Lower abdomen has become distended.  Past Medical History:  Diagnosis Date  . Diabetes mellitus without complication ([place])   . Hyperlipidemia   . Hypertension     Patient Active Problem List   Diagnosis Date Noted  . Pressure injury of skin 03/06/2016  . Gastroenteritis 03/05/2016  . Dehydration 03/05/2016  . DM type 2 with diabetic peripheral neuropathy ([place])   . Constipation due to pain medication   . Benign essential HTN   . Post-operative pain   . Sacral decubitus ulcer 01/17/2016  . Accident caused by farm tractor 01/15/2016  . Urethral injury 01/15/2016  . Acute blood loss anemia 01/15/2016  . Acute kidney injury ([place]) 01/15/2016  . Multiple pelvic fractures ([place]) 01/10/2016    Past Surgical History:  Procedure Laterality Date  . ACHILLES TENDON SURGERY Right   . APPLICATION OF A-CELL OF BACK N/A 02/10/2016   Procedure: APPLICATION OF A-CELL OF sacral wound;  Surgeon: Loel Lofty Dillingham, DO;  Location: [place];  Service: Plastics;  Laterality: N/A;  . APPLICATION OF WOUND VAC N/A 02/10/2016   Procedure: APPLICATION OF WOUND VAC possible;  Surgeon: Loel Lofty Dillingham, DO;  Location: [place];  Service: Plastics;   Laterality: N/A;  . I&D EXTREMITY N/A 01/30/2016   Procedure: IRRIGATION AND DEBRIDEMENT OF THE SACRUM;  Surgeon: Loel Lofty Dillingham, DO;  Location: [place];  Service: Plastics;  Laterality: N/A;  . INCISION AND DRAINAGE OF WOUND N/A 02/10/2016   Procedure: IRRIGATION AND DEBRIDEMENT sacral WOUND;  Surgeon: Loel Lofty Dillingham, DO;  Location: [place];  Service: Plastics;  Laterality: N/A;  . IR GENERIC HISTORICAL  02/11/2016   IR CATHETER TUBE CHANGE 02/11/2016 Aletta Edouard, MD [facility]  . IR GENERIC HISTORICAL  03/16/2016   IR CATHETER TUBE CHANGE 03/16/2016 Sandi Mariscal, MD [facility]  . IR GENERIC HISTORICAL  05/11/2016   IR CATHETER TUBE CHANGE 05/11/2016 Aletta Edouard, MD [facility]  . SACRO-ILIAC PINNING N/A 01/16/2016   Procedure: Jenetta Downer;  Surgeon: Altamese [place], MD;  Location: [place];  Service: Orthopedics;  Laterality: N/A;  . TONSILLECTOMY    . ULNAR NERVE TRANSPOSITION Right        Home Medications    Prior to Admission medications   Medication Sig Start Date End Date Taking? Authorizing Provider  amLODipine (NORVASC) 10 MG tablet Take 1 tablet (10 mg total) by mouth daily. 05/05/16  Yes Tonia Ghent, MD  atorvastatin (LIPITOR) 80 MG tablet Take 1 tablet (80 mg total) by mouth daily at 6 PM. 04/03/16  Yes Ria Bush, MD  fluticasone [facility]) 50 MCG/ACT nasal spray Place 2 sprays into both nostrils daily as needed for allergies or  rhinitis.    Yes Historical Provider, MD  gabapentin (NEURONTIN) 300 MG capsule Take 1 capsule (300 mg total) by mouth 3 (three) times daily. Patient taking differently: Take 300 mg by mouth 2 (two) times daily.  02/12/16  Yes Daniel J Angiulli, PA-C  glipiZIDE (GLUCOTROL) 5 MG tablet Take 5-10 mg by mouth See admin instructions. Take 2 tablets (10 mg) by mouth every morning and 1 tablet (5 mg) at night 01/10/16  Yes Historical Provider, MD  insulin aspart (NOVOLOG) 100 UNIT/ML injection Inject 4 Units into the skin 3  (three) times daily with meals. Patient taking differently: Inject 4 Units into the skin daily.  02/12/16  Yes Daniel J Angiulli, PA-C  insulin detemir (LEVEMIR) 100 UNIT/ML injection Inject 0.15 mLs (15 Units total) into the skin at bedtime. 02/12/16  Yes Daniel J Angiulli, PA-C  metFORMIN (GLUCOPHAGE) 1000 MG tablet Take 1 tablet (1,000 mg total) by mouth 2 (two) times daily. 04/24/16  Yes Tonia Ghent, MD  Multiple Vitamin (MULTIVITAMIN WITH MINERALS) TABS tablet Take 1 tablet by mouth daily.   Yes Historical Provider, MD  OVER THE COUNTER MEDICATION Apply topically See admin instructions. Mix 1 quart water with 1 tablespoon plus 2 teaspoons clorox and use to cleanse wound on back twice daily   Yes Historical Provider, MD  pantoprazole (PROTONIX) 40 MG tablet Take 1 tablet (40 mg total) by mouth 2 (two) times daily. 04/24/16  Yes Tonia Ghent, MD  traMADol (ULTRAM) 50 MG tablet Take 1 tablet (50 mg total) by mouth 3 (three) times daily as needed (pain). 03/26/16  Yes Tonia Ghent, MD  vitamin C (VITAMIN C) 500 MG tablet Take 1 tablet (500 mg total) by mouth 2 (two) times daily. 02/12/16  Yes Daniel J Angiulli, PA-C  calcium carbonate (OS-CAL - DOSED IN MG OF ELEMENTAL CALCIUM) 1250 (500 Ca) MG tablet Take 1 tablet (500 mg of elemental calcium total) by mouth 3 (three) times daily with meals. Patient not taking: Reported on 05/17/2016 02/12/16   Lavon Paganini Angiulli, PA-C  senna-docusate (SENOKOT-S) 8.6-50 MG tablet Take 2 tablets by mouth 2 (two) times daily. Patient not taking: Reported on 05/17/2016 02/12/16   Lavon Paganini Angiulli, PA-C  zinc sulfate 220 (50 Zn) MG capsule Take 1 capsule (220 mg total) by mouth daily. Patient not taking: Reported on 05/17/2016 02/12/16   Lavon Paganini Angiulli, PA-C    Family History Family History  Problem Relation Age of Onset  . Pancreatic cancer Mother   . CVA Father   . Dementia Father   . Diabetes Neg Hx     Social History Social History  Substance Use Topics    . Smoking status: Never Smoker  . Smokeless tobacco: Never Used  . Alcohol use No     Allergies   No known allergies   Review of Systems Review of Systems  Constitutional: Negative for fever.  Gastrointestinal: Positive for abdominal distention and abdominal pain. Negative for nausea and vomiting.  Genitourinary: Positive for decreased urine volume and difficulty urinating.  All other systems reviewed and are negative.    Physical Exam Updated Vital Signs BP 156/83 (BP Location: Right Arm)   Pulse 102   Temp 98 F (36.7 C) (Oral)   Resp 20   Ht 6\' 2"  (1.88 m)   Wt 198 lb (89.8 kg)   SpO2 100%   BMI 25.42 kg/m   Physical Exam  Constitutional: He is oriented to person, place, and time. He appears well-developed  and well-nourished.  HENT:  Head: Normocephalic and atraumatic.  Right Ear: External ear normal.  Left Ear: External ear normal.  Nose: Nose normal.  Eyes: Right eye exhibits no discharge. Left eye exhibits no discharge.  Neck: Neck supple.  Cardiovascular: Normal rate, regular rhythm and normal heart sounds.   Pulmonary/Chest: Effort normal and breath sounds normal.  Abdominal: Soft. There is tenderness (diffuse, worse in lower abdomen).  Suprapubic catheter in place. Mild erythema to site. Sediment noted in connector to leg bag  Musculoskeletal: He exhibits no edema.  Neurological: He is alert and oriented to person, place, and time.  Skin: Skin is warm and dry. He is not diaphoretic.  Nursing note and vitals reviewed.    ED Treatments / Results  Labs (all labs ordered are listed, but only abnormal results are displayed) Labs Reviewed  BASIC METABOLIC PANEL  CBC WITH DIFFERENTIAL/PLATELET  URINALYSIS, ROUTINE W REFLEX MICROSCOPIC    EKG  EKG Interpretation None       Radiology No results found.  Procedures Procedures (including critical care time)  Medications Ordered in ED Medications - No data to display   Initial Impression /  Assessment and Plan / ED Course  I have reviewed the triage vital signs and the nursing notes.  Pertinent labs & imaging results that were available during my care of the patient were reviewed by me and considered in my medical decision making (see chart for details).  Clinical Course as of May 17 640  Sun May 17, 2016  [id number] Most likely has acutely blocked catheter. Will try to flush and if this doesn't work will likely need the catheter replaced.  [SG]    Clinical Course User Index [SG] Sherwood Gambler, MD    Once the tube connecting the leg bag to the suprapubic catheter was disconnected he had a large amount of urine release. Obstruction appears to be in connector tubing, likely from sediment. Catheter flushed by nursing. Probably all from UTI. Will give antibiotics. Currently CBC/BMP pending. If no renal failure and catheter continues to drain, will d/c home. Care transferred to Dr. Lacinda Axon  Final Clinical Impressions(s) / ED Diagnoses   Final diagnoses:  Complication of Foley catheter, initial encounter [facility])  Urinary tract infection associated with indwelling urethral catheter, initial encounter [facility])    New Prescriptions New Prescriptions   No medications on file     Sherwood Gambler, MD 05/17/16 0825

## 2016-05-17 NOTE — ED Triage Notes (Signed)
Pt has a suprapubic catheter after a traumatic injury last September.  Per pt there has been no output since 2000 last night.  Pt rates pain 8/10, pressure in bladder.

## 2016-05-17 NOTE — Discharge Instructions (Signed)
Prescription for antibiotic. Follow-up your urologist.

## 2016-05-18 ENCOUNTER — Other Ambulatory Visit: Payer: Self-pay | Admitting: *Deleted

## 2016-05-18 DIAGNOSIS — S3730XD Unspecified injury of urethra, subsequent encounter: Secondary | ICD-10-CM | POA: Diagnosis not present

## 2016-05-18 DIAGNOSIS — S32811D Multiple fractures of pelvis with unstable disruption of pelvic ring, subsequent encounter for fracture with routine healing: Secondary | ICD-10-CM | POA: Diagnosis not present

## 2016-05-18 DIAGNOSIS — L89153 Pressure ulcer of sacral region, stage 3: Secondary | ICD-10-CM | POA: Diagnosis not present

## 2016-05-18 DIAGNOSIS — E1142 Type 2 diabetes mellitus with diabetic polyneuropathy: Secondary | ICD-10-CM | POA: Diagnosis not present

## 2016-05-18 DIAGNOSIS — S32501D Unspecified fracture of right pubis, subsequent encounter for fracture with routine healing: Secondary | ICD-10-CM | POA: Diagnosis not present

## 2016-05-18 DIAGNOSIS — D62 Acute posthemorrhagic anemia: Secondary | ICD-10-CM | POA: Diagnosis not present

## 2016-05-18 MED ORDER — GLIPIZIDE 5 MG PO TABS: ORAL_TABLET | ORAL | 5 refills | Status: DC

## 2016-05-19 DIAGNOSIS — L89153 Pressure ulcer of sacral region, stage 3: Secondary | ICD-10-CM | POA: Diagnosis not present

## 2016-05-19 DIAGNOSIS — S3730XD Unspecified injury of urethra, subsequent encounter: Secondary | ICD-10-CM | POA: Diagnosis not present

## 2016-05-19 DIAGNOSIS — E1142 Type 2 diabetes mellitus with diabetic polyneuropathy: Secondary | ICD-10-CM | POA: Diagnosis not present

## 2016-05-19 DIAGNOSIS — S32811D Multiple fractures of pelvis with unstable disruption of pelvic ring, subsequent encounter for fracture with routine healing: Secondary | ICD-10-CM | POA: Diagnosis not present

## 2016-05-19 DIAGNOSIS — S32501D Unspecified fracture of right pubis, subsequent encounter for fracture with routine healing: Secondary | ICD-10-CM | POA: Diagnosis not present

## 2016-05-19 DIAGNOSIS — D62 Acute posthemorrhagic anemia: Secondary | ICD-10-CM | POA: Diagnosis not present

## 2016-05-20 DIAGNOSIS — S32501D Unspecified fracture of right pubis, subsequent encounter for fracture with routine healing: Secondary | ICD-10-CM | POA: Diagnosis not present

## 2016-05-20 DIAGNOSIS — L89153 Pressure ulcer of sacral region, stage 3: Secondary | ICD-10-CM | POA: Diagnosis not present

## 2016-05-20 DIAGNOSIS — S3730XD Unspecified injury of urethra, subsequent encounter: Secondary | ICD-10-CM | POA: Diagnosis not present

## 2016-05-20 DIAGNOSIS — D62 Acute posthemorrhagic anemia: Secondary | ICD-10-CM | POA: Diagnosis not present

## 2016-05-20 DIAGNOSIS — E1142 Type 2 diabetes mellitus with diabetic polyneuropathy: Secondary | ICD-10-CM | POA: Diagnosis not present

## 2016-05-20 DIAGNOSIS — S32811D Multiple fractures of pelvis with unstable disruption of pelvic ring, subsequent encounter for fracture with routine healing: Secondary | ICD-10-CM | POA: Diagnosis not present

## 2016-05-20 LAB — URINE CULTURE: Culture: >=100000 — AB

## 2016-05-21 ENCOUNTER — Encounter (HOSPITAL_BASED_OUTPATIENT_CLINIC_OR_DEPARTMENT_OTHER): Payer: Medicare Other | Attending: Internal Medicine

## 2016-05-21 ENCOUNTER — Telehealth (HOSPITAL_BASED_OUTPATIENT_CLINIC_OR_DEPARTMENT_OTHER): Payer: Self-pay

## 2016-05-21 DIAGNOSIS — I10 Essential (primary) hypertension: Secondary | ICD-10-CM | POA: Diagnosis not present

## 2016-05-21 DIAGNOSIS — E1142 Type 2 diabetes mellitus with diabetic polyneuropathy: Secondary | ICD-10-CM | POA: Diagnosis not present

## 2016-05-21 DIAGNOSIS — S32811D Multiple fractures of pelvis with unstable disruption of pelvic ring, subsequent encounter for fracture with routine healing: Secondary | ICD-10-CM | POA: Diagnosis not present

## 2016-05-21 DIAGNOSIS — S32501D Unspecified fracture of right pubis, subsequent encounter for fracture with routine healing: Secondary | ICD-10-CM | POA: Diagnosis not present

## 2016-05-21 DIAGNOSIS — E1140 Type 2 diabetes mellitus with diabetic neuropathy, unspecified: Secondary | ICD-10-CM | POA: Insufficient documentation

## 2016-05-21 DIAGNOSIS — L89153 Pressure ulcer of sacral region, stage 3: Secondary | ICD-10-CM | POA: Diagnosis not present

## 2016-05-21 DIAGNOSIS — L98492 Non-pressure chronic ulcer of skin of other sites with fat layer exposed: Secondary | ICD-10-CM | POA: Diagnosis not present

## 2016-05-21 DIAGNOSIS — D62 Acute posthemorrhagic anemia: Secondary | ICD-10-CM | POA: Diagnosis not present

## 2016-05-21 DIAGNOSIS — S3730XD Unspecified injury of urethra, subsequent encounter: Secondary | ICD-10-CM | POA: Diagnosis not present

## 2016-05-21 NOTE — Telephone Encounter (Signed)
Post ED Visit - Positive Culture Follow-up  Culture report reviewed by antimicrobial stewardship pharmacist:  []  Elenor Quinones, Pharm.D. []  Heide Guile, Pharm.D., BCPS []  Parks Neptune, Pharm.D. []  Alycia Rossetti, Pharm.D., BCPS []  [place], [place].D., BCPS, AAHIVP []  Legrand Como, Pharm.D., BCPS, AAHIVP []  Milus Glazier, Pharm.D. []  Stephens November, [place].D.  Positive urine culture, >/= 100,000 colonies -> E Coli Treated with Cephalexin, organism sensitive to the same and no further patient follow-up is required at this time.  Dortha Kern 05/21/2016, 11:22 AM

## 2016-05-22 DIAGNOSIS — E1142 Type 2 diabetes mellitus with diabetic polyneuropathy: Secondary | ICD-10-CM | POA: Diagnosis not present

## 2016-05-22 DIAGNOSIS — L89153 Pressure ulcer of sacral region, stage 3: Secondary | ICD-10-CM | POA: Diagnosis not present

## 2016-05-22 DIAGNOSIS — S32811D Multiple fractures of pelvis with unstable disruption of pelvic ring, subsequent encounter for fracture with routine healing: Secondary | ICD-10-CM | POA: Diagnosis not present

## 2016-05-22 DIAGNOSIS — D62 Acute posthemorrhagic anemia: Secondary | ICD-10-CM | POA: Diagnosis not present

## 2016-05-22 DIAGNOSIS — S32501D Unspecified fracture of right pubis, subsequent encounter for fracture with routine healing: Secondary | ICD-10-CM | POA: Diagnosis not present

## 2016-05-22 DIAGNOSIS — S3730XD Unspecified injury of urethra, subsequent encounter: Secondary | ICD-10-CM | POA: Diagnosis not present

## 2016-05-25 DIAGNOSIS — S32811D Multiple fractures of pelvis with unstable disruption of pelvic ring, subsequent encounter for fracture with routine healing: Secondary | ICD-10-CM | POA: Diagnosis not present

## 2016-05-25 DIAGNOSIS — E1142 Type 2 diabetes mellitus with diabetic polyneuropathy: Secondary | ICD-10-CM | POA: Diagnosis not present

## 2016-05-25 DIAGNOSIS — S3730XD Unspecified injury of urethra, subsequent encounter: Secondary | ICD-10-CM | POA: Diagnosis not present

## 2016-05-25 DIAGNOSIS — D62 Acute posthemorrhagic anemia: Secondary | ICD-10-CM | POA: Diagnosis not present

## 2016-05-25 DIAGNOSIS — L89153 Pressure ulcer of sacral region, stage 3: Secondary | ICD-10-CM | POA: Diagnosis not present

## 2016-05-25 DIAGNOSIS — S32501D Unspecified fracture of right pubis, subsequent encounter for fracture with routine healing: Secondary | ICD-10-CM | POA: Diagnosis not present

## 2016-05-26 DIAGNOSIS — S32501D Unspecified fracture of right pubis, subsequent encounter for fracture with routine healing: Secondary | ICD-10-CM | POA: Diagnosis not present

## 2016-05-26 DIAGNOSIS — E1142 Type 2 diabetes mellitus with diabetic polyneuropathy: Secondary | ICD-10-CM | POA: Diagnosis not present

## 2016-05-26 DIAGNOSIS — S32811D Multiple fractures of pelvis with unstable disruption of pelvic ring, subsequent encounter for fracture with routine healing: Secondary | ICD-10-CM | POA: Diagnosis not present

## 2016-05-26 DIAGNOSIS — L89153 Pressure ulcer of sacral region, stage 3: Secondary | ICD-10-CM | POA: Diagnosis not present

## 2016-05-26 DIAGNOSIS — S3730XD Unspecified injury of urethra, subsequent encounter: Secondary | ICD-10-CM | POA: Diagnosis not present

## 2016-05-26 DIAGNOSIS — D62 Acute posthemorrhagic anemia: Secondary | ICD-10-CM | POA: Diagnosis not present

## 2016-05-27 DIAGNOSIS — S32501D Unspecified fracture of right pubis, subsequent encounter for fracture with routine healing: Secondary | ICD-10-CM | POA: Diagnosis not present

## 2016-05-27 DIAGNOSIS — L89153 Pressure ulcer of sacral region, stage 3: Secondary | ICD-10-CM | POA: Diagnosis not present

## 2016-05-27 DIAGNOSIS — E1142 Type 2 diabetes mellitus with diabetic polyneuropathy: Secondary | ICD-10-CM | POA: Diagnosis not present

## 2016-05-27 DIAGNOSIS — S32811D Multiple fractures of pelvis with unstable disruption of pelvic ring, subsequent encounter for fracture with routine healing: Secondary | ICD-10-CM | POA: Diagnosis not present

## 2016-05-27 DIAGNOSIS — D62 Acute posthemorrhagic anemia: Secondary | ICD-10-CM | POA: Diagnosis not present

## 2016-05-27 DIAGNOSIS — S3730XD Unspecified injury of urethra, subsequent encounter: Secondary | ICD-10-CM | POA: Diagnosis not present

## 2016-05-28 DIAGNOSIS — L89153 Pressure ulcer of sacral region, stage 3: Secondary | ICD-10-CM | POA: Diagnosis not present

## 2016-05-28 DIAGNOSIS — E1142 Type 2 diabetes mellitus with diabetic polyneuropathy: Secondary | ICD-10-CM | POA: Diagnosis not present

## 2016-05-28 DIAGNOSIS — S3730XD Unspecified injury of urethra, subsequent encounter: Secondary | ICD-10-CM | POA: Diagnosis not present

## 2016-05-28 DIAGNOSIS — S32811D Multiple fractures of pelvis with unstable disruption of pelvic ring, subsequent encounter for fracture with routine healing: Secondary | ICD-10-CM | POA: Diagnosis not present

## 2016-05-28 DIAGNOSIS — S32501D Unspecified fracture of right pubis, subsequent encounter for fracture with routine healing: Secondary | ICD-10-CM | POA: Diagnosis not present

## 2016-05-28 DIAGNOSIS — D62 Acute posthemorrhagic anemia: Secondary | ICD-10-CM | POA: Diagnosis not present

## 2016-06-01 ENCOUNTER — Other Ambulatory Visit: Payer: Self-pay | Admitting: Family Medicine

## 2016-06-01 DIAGNOSIS — S32811D Multiple fractures of pelvis with unstable disruption of pelvic ring, subsequent encounter for fracture with routine healing: Secondary | ICD-10-CM | POA: Diagnosis not present

## 2016-06-01 DIAGNOSIS — S32501D Unspecified fracture of right pubis, subsequent encounter for fracture with routine healing: Secondary | ICD-10-CM | POA: Diagnosis not present

## 2016-06-01 DIAGNOSIS — D649 Anemia, unspecified: Secondary | ICD-10-CM

## 2016-06-01 DIAGNOSIS — D62 Acute posthemorrhagic anemia: Secondary | ICD-10-CM | POA: Diagnosis not present

## 2016-06-01 DIAGNOSIS — L89153 Pressure ulcer of sacral region, stage 3: Secondary | ICD-10-CM | POA: Diagnosis not present

## 2016-06-01 DIAGNOSIS — E1142 Type 2 diabetes mellitus with diabetic polyneuropathy: Secondary | ICD-10-CM | POA: Diagnosis not present

## 2016-06-01 DIAGNOSIS — E119 Type 2 diabetes mellitus without complications: Secondary | ICD-10-CM

## 2016-06-01 DIAGNOSIS — S3730XD Unspecified injury of urethra, subsequent encounter: Secondary | ICD-10-CM | POA: Diagnosis not present

## 2016-06-03 ENCOUNTER — Other Ambulatory Visit: Payer: Medicare Other

## 2016-06-03 ENCOUNTER — Ambulatory Visit (INDEPENDENT_AMBULATORY_CARE_PROVIDER_SITE_OTHER): Payer: Medicare Other

## 2016-06-03 VITALS — BP 118/70 | HR 99 | Temp 97.9°F | Ht 74.0 in | Wt 209.5 lb

## 2016-06-03 DIAGNOSIS — Z0000 Encounter for general adult medical examination without abnormal findings: Secondary | ICD-10-CM | POA: Diagnosis not present

## 2016-06-03 DIAGNOSIS — D649 Anemia, unspecified: Secondary | ICD-10-CM

## 2016-06-03 DIAGNOSIS — S32501D Unspecified fracture of right pubis, subsequent encounter for fracture with routine healing: Secondary | ICD-10-CM | POA: Diagnosis not present

## 2016-06-03 DIAGNOSIS — L89153 Pressure ulcer of sacral region, stage 3: Secondary | ICD-10-CM | POA: Diagnosis not present

## 2016-06-03 DIAGNOSIS — E119 Type 2 diabetes mellitus without complications: Secondary | ICD-10-CM | POA: Diagnosis not present

## 2016-06-03 DIAGNOSIS — D62 Acute posthemorrhagic anemia: Secondary | ICD-10-CM | POA: Diagnosis not present

## 2016-06-03 DIAGNOSIS — S32811D Multiple fractures of pelvis with unstable disruption of pelvic ring, subsequent encounter for fracture with routine healing: Secondary | ICD-10-CM | POA: Diagnosis not present

## 2016-06-03 DIAGNOSIS — S3730XD Unspecified injury of urethra, subsequent encounter: Secondary | ICD-10-CM | POA: Diagnosis not present

## 2016-06-03 DIAGNOSIS — E1142 Type 2 diabetes mellitus with diabetic polyneuropathy: Secondary | ICD-10-CM | POA: Diagnosis not present

## 2016-06-03 LAB — LIPID PANEL
Cholesterol: 108 mg/dL (ref 0–200)
HDL: 32.5 mg/dL — ABNORMAL LOW (ref >39.00)
NONHDL: 75.87
Total CHOL/HDL Ratio: 3
Triglycerides: 305 mg/dL — ABNORMAL HIGH (ref 0.0–149.0)
VLDL: 61 mg/dL — ABNORMAL HIGH (ref 0.0–40.0)

## 2016-06-03 LAB — CBC WITH DIFFERENTIAL/PLATELET
BASOS ABS: 0.1 10*3/uL (ref 0.0–0.1)
Basophils Relative: 0.7 % (ref 0.0–3.0)
EOS PCT: 3.5 % (ref 0.0–5.0)
Eosinophils Absolute: 0.3 10*3/uL (ref 0.0–0.7)
HEMATOCRIT: 36.4 % — AB (ref 39.0–52.0)
HEMOGLOBIN: 12.3 g/dL — AB (ref 13.0–17.0)
LYMPHS ABS: 2.1 10*3/uL (ref 0.7–4.0)
LYMPHS PCT: 24.8 % (ref 12.0–46.0)
MCHC: 33.7 g/dL (ref 30.0–36.0)
MCV: 84 fl (ref 78.0–100.0)
MONOS PCT: 6.8 % (ref 3.0–12.0)
Monocytes Absolute: 0.6 10*3/uL (ref 0.1–1.0)
NEUTROS PCT: 64.2 % (ref 43.0–77.0)
Neutro Abs: 5.4 10*3/uL (ref 1.4–7.7)
Platelets: 239 10*3/uL (ref 150.0–400.0)
RBC: 4.33 Mil/uL (ref 4.22–5.81)
RDW: 18.1 % — ABNORMAL HIGH (ref 11.5–15.5)
WBC: 8.4 10*3/uL (ref 4.0–10.5)

## 2016-06-03 LAB — COMPREHENSIVE METABOLIC PANEL
ALK PHOS: 88 U/L (ref 39–117)
ALT: 18 U/L (ref 0–53)
AST: 19 U/L (ref 0–37)
Albumin: 4.1 g/dL (ref 3.5–5.2)
BILIRUBIN TOTAL: 0.5 mg/dL (ref 0.2–1.2)
BUN: 13 mg/dL (ref 6–23)
CO2: 26 meq/L (ref 19–32)
CREATININE: 0.88 mg/dL (ref 0.40–1.50)
Calcium: 9.4 mg/dL (ref 8.4–10.5)
Chloride: 103 mEq/L (ref 96–112)
GFR: 89.76 mL/min (ref >60.00)
GLUCOSE: 197 mg/dL — AB (ref 70–99)
Potassium: 4.7 mEq/L (ref 3.5–5.1)
Sodium: 138 mEq/L (ref 135–145)
TOTAL PROTEIN: 7 g/dL (ref 6.0–8.3)

## 2016-06-03 LAB — IBC PANEL
Iron: 76 ug/dL (ref 42–165)
SATURATION RATIOS: 22.4 % (ref 20.0–50.0)
Transferrin: 242 mg/dL (ref 212.0–360.0)

## 2016-06-03 LAB — HEMOGLOBIN A1C: HEMOGLOBIN A1C: 6.3 % (ref 4.6–6.5)

## 2016-06-03 LAB — LDL CHOLESTEROL, DIRECT: LDL DIRECT: 48 mg/dL

## 2016-06-03 NOTE — Patient Instructions (Signed)
Christian [name] , Thank you for taking time to come for your Medicare Wellness Visit. I appreciate your ongoing commitment to your health goals. Please review the following plan we discussed and let me know if I can assist you in the future.   These are the goals we discussed: Goals    . recover from accident          Starting 06/03/2016, I will continue to participate in therapy as ordered and use wound vac as prescribed in an effort to continue with recovery from accident.        This is a list of the screening recommended for you and due dates:  Health Maintenance  Topic Date Due  . Eye exam for diabetics  05/14/2017*  . Urine Protein Check  06/03/2017*  . Hemoglobin A1C  09/03/2016  . Complete foot exam   03/26/2017  . Colon Cancer Screening  05/15/2023  . Tetanus Vaccine  05/14/2024  . Flu Shot  Completed  . Pneumonia vaccines  Completed  *Topic was postponed. The date shown is not the original due date.   Preventive Care for Adults  A healthy lifestyle and preventive care can promote health and wellness. Preventive health guidelines for adults include the following key practices.  . A routine yearly physical is a good way to check with your health care provider about your health and preventive screening. It is a chance to share any concerns and updates on your health and to receive a thorough exam.  . Visit your dentist for a routine exam and preventive care every 6 months. Brush your teeth twice a day and floss once a day. Good oral hygiene prevents tooth decay and gum disease.  . The frequency of eye exams is based on your age, health, family medical history, use  of contact lenses, and other factors. Follow your health care provider's ecommendations for frequency of eye exams.  . Eat a healthy diet. Foods like vegetables, fruits, whole grains, low-fat dairy products, and lean protein foods contain the nutrients you need without too many calories. Decrease your intake of foods  high in solid fats, added sugars, and salt. Eat the right amount of calories for you. Get information about a proper diet from your health care provider, if necessary.  . Regular physical exercise is one of the most important things you can do for your health. Most adults should get at least 150 minutes of moderate-intensity exercise (any activity that increases your heart rate and causes you to sweat) each week. In addition, most adults need muscle-strengthening exercises on 2 or more days a week.  Silver Sneakers may be a benefit available to you. To determine eligibility, you may visit the website: www.silversneakers.com or contact program at (405)779-2734 Mon-Fri between 8AM-8PM.   . Maintain a healthy weight. The body mass index (BMI) is a screening tool to identify possible weight problems. It provides an estimate of body fat based on height and weight. Your health care provider can find your BMI and can help you achieve or maintain a healthy weight.   For adults 20 years and older: ? A BMI below 18.5 is considered underweight. ? A BMI of 18.5 to 24.9 is normal. ? A BMI of 25 to 29.9 is considered overweight. ? A BMI of 30 and above is considered obese.   . Maintain normal blood lipids and cholesterol levels by exercising and minimizing your intake of saturated fat. Eat a balanced diet with plenty of fruit and vegetables.  Blood tests for lipids and cholesterol should begin at age 38 and be repeated every 5 years. If your lipid or cholesterol levels are high, you are over 50, or you are at high risk for heart disease, you may need your cholesterol levels checked more frequently. Ongoing high lipid and cholesterol levels should be treated with medicines if diet and exercise are not working.  . If you smoke, find out from your health care provider how to quit. If you do not use tobacco, please do not start.  . If you choose to drink alcohol, please do not consume more than 2 drinks per day.  One drink is considered to be 12 ounces (355 mL) of beer, 5 ounces (148 mL) of wine, or 1.5 ounces (44 mL) of liquor.  . If you are 69-83 years old, ask your health care provider if you should take aspirin to prevent strokes.  . Use sunscreen. Apply sunscreen liberally and repeatedly throughout the day. You should seek shade when your shadow is shorter than you. Protect yourself by wearing long sleeves, pants, a wide-brimmed hat, and sunglasses year round, whenever you are outdoors.  . Once a month, do a whole body skin exam, using a mirror to look at the skin on your back. Tell your health care provider of new moles, moles that have irregular borders, moles that are larger than a pencil eraser, or moles that have changed in shape or color.

## 2016-06-03 NOTE — Progress Notes (Signed)
PCP notes:   Health maintenance:  Eye exam - addressed; pt has future appt scheduled Urine microalbumin - completed Colonoscopy - per pt, completed in 2015 Tetanus vaccine- per pt, completed in 2016  Abnormal screenings:   None  Patient concerns:   Pt has reported back pain. Pain scale: 2/10.   Nurse concerns:  None  Next PCP appt:   06/09/16 @ 0815  I reviewed health advisor's note, was available for consultation on the day of service listed in this note, and agree with documentation and plan. Elsie Stain, MD.

## 2016-06-03 NOTE — Progress Notes (Signed)
Subjective:   Christian [name] is a 76 y.o. male who presents for Medicare Annual/Subsequent preventive examination.  Review of Systems:  N/A Cardiac Risk Factors include: advanced age (>70men, >32 women);male gender     Objective:    Vitals: BP 118/70 (BP Location: Left Arm, Patient Position: Sitting, Cuff Size: Normal)   Pulse 99   Temp 97.9 F (36.6 C) (Oral)   Ht 6\' 2"  (1.88 m) Comment: shoes  Wt 209 lb 8 oz (95 kg)   SpO2 97%   BMI 26.90 kg/m   Body mass index is 26.9 kg/m.  Tobacco History  Smoking Status  . Never Smoker  Smokeless Tobacco  . Never Used     Counseling given: No   Past Medical History:  Diagnosis Date  . Diabetes mellitus without complication ([place])   . Hyperlipidemia   . Hypertension    Past Surgical History:  Procedure Laterality Date  . ACHILLES TENDON SURGERY Right   . APPLICATION OF A-CELL OF BACK N/A 02/10/2016   Procedure: APPLICATION OF A-CELL OF sacral wound;  Surgeon: Loel Lofty Dillingham, DO;  Location: [place];  Service: Plastics;  Laterality: N/A;  . APPLICATION OF WOUND VAC N/A 02/10/2016   Procedure: APPLICATION OF WOUND VAC possible;  Surgeon: Loel Lofty Dillingham, DO;  Location: [place];  Service: Plastics;  Laterality: N/A;  . I&D EXTREMITY N/A 01/30/2016   Procedure: IRRIGATION AND DEBRIDEMENT OF THE SACRUM;  Surgeon: Loel Lofty Dillingham, DO;  Location: [place];  Service: Plastics;  Laterality: N/A;  . INCISION AND DRAINAGE OF WOUND N/A 02/10/2016   Procedure: IRRIGATION AND DEBRIDEMENT sacral WOUND;  Surgeon: Loel Lofty Dillingham, DO;  Location: [place];  Service: Plastics;  Laterality: N/A;  . IR GENERIC HISTORICAL  02/11/2016   IR CATHETER TUBE CHANGE 02/11/2016 Aletta Edouard, MD [facility]  . IR GENERIC HISTORICAL  03/16/2016   IR CATHETER TUBE CHANGE 03/16/2016 Sandi Mariscal, MD [facility]  . IR GENERIC HISTORICAL  05/11/2016   IR CATHETER TUBE CHANGE 05/11/2016 Aletta Edouard, MD [facility]  . SACRO-ILIAC PINNING N/A  01/16/2016   Procedure: Jenetta Downer;  Surgeon: Altamese Hurley, MD;  Location: [place];  Service: Orthopedics;  Laterality: N/A;  . TONSILLECTOMY    . ULNAR NERVE TRANSPOSITION Right    Family History  Problem Relation Age of Onset  . Pancreatic cancer Mother   . CVA Father   . Dementia Father   . Diabetes Neg Hx    History  Sexual Activity  . Sexual activity: Not Currently    Outpatient Encounter Prescriptions as of 06/03/2016  Medication Sig  . amLODipine (NORVASC) 10 MG tablet Take 1 tablet (10 mg total) by mouth daily.  Marland Kitchen atorvastatin (LIPITOR) 80 MG tablet Take 1 tablet (80 mg total) by mouth daily at 6 PM.  . fluticasone (FLONASE) 50 MCG/ACT nasal spray Place 2 sprays into both nostrils daily as needed for allergies or rhinitis.   Marland Kitchen gabapentin (NEURONTIN) 300 MG capsule Take 1 capsule (300 mg total) by mouth 3 (three) times daily. (Patient taking differently: Take 300 mg by mouth 2 (two) times daily. )  . glipiZIDE (GLUCOTROL) 5 MG tablet Take 2 tablets (10 mg) by mouth every morning and 1 tablet (5 mg) at night  . metFORMIN (GLUCOPHAGE) 1000 MG tablet Take 1 tablet (1,000 mg total) by mouth 2 (two) times daily.  Marland Kitchen OVER THE COUNTER MEDICATION Apply topically See admin instructions. Mix 1 quart water with 1 tablespoon plus 2 teaspoons  clorox and use to cleanse wound on back twice daily  . traMADol (ULTRAM) 50 MG tablet Take 1 tablet (50 mg total) by mouth 3 (three) times daily as needed (pain).  . vitamin C (VITAMIN C) 500 MG tablet Take 1 tablet (500 mg total) by mouth 2 (two) times daily.  . calcium carbonate (OS-CAL - DOSED IN MG OF ELEMENTAL CALCIUM) 1250 (500 Ca) MG tablet Take 1 tablet (500 mg of elemental calcium total) by mouth 3 (three) times daily with meals. (Patient not taking: Reported on 06/03/2016)  . insulin aspart (NOVOLOG) 100 UNIT/ML injection Inject 4 Units into the skin 3 (three) times daily with meals. (Patient not taking: Reported on 06/03/2016)  .  insulin detemir (LEVEMIR) 100 UNIT/ML injection Inject 0.15 mLs (15 Units total) into the skin at bedtime. (Patient not taking: Reported on 06/03/2016)  . Multiple Vitamin (MULTIVITAMIN WITH MINERALS) TABS tablet Take 1 tablet by mouth daily.  . pantoprazole (PROTONIX) 40 MG tablet Take 1 tablet (40 mg total) by mouth 2 (two) times daily. (Patient not taking: Reported on 06/03/2016)  . senna-docusate (SENOKOT-S) 8.6-50 MG tablet Take 2 tablets by mouth 2 (two) times daily. (Patient not taking: Reported on 06/03/2016)  . zinc sulfate 220 (50 Zn) MG capsule Take 1 capsule (220 mg total) by mouth daily. (Patient not taking: Reported on 06/03/2016)   Facility-Administered Encounter Medications as of 06/03/2016  Medication  . lactated ringers infusion    Activities of Daily Living In your present state of health, do you have any difficulty performing the following activities: 06/03/2016 03/06/2016  Hearing? Tempie Donning  Vision? N N  Difficulty concentrating or making decisions? Y N  Walking or climbing stairs? N Y  Dressing or bathing? N Y  Doing errands, shopping? N -  Preparing Food and eating ? N -  Using the Toilet? N -  In the past six months, have you accidently leaked urine? N -  Do you have problems with loss of bowel control? N -  Managing your Medications? N -  Managing your Finances? N -  Housekeeping or managing your Housekeeping? N -  Some recent data might be hidden    Patient Care Team: Tonia Ghent, MD as PCP - General (Family Medicine)   Assessment:    Hearing Screening Comments: Bilateral hearing aids Vision Screening Comments: Last vision exam in Feb 2017  Exercise Activities and Dietary recommendations Current Exercise Habits: Home exercise routine, Type of exercise: Other - see comments (therapy exercises), Time (Minutes): 30, Frequency (Times/Week): 2, Weekly Exercise (Minutes/Week): 60, Intensity: Mild, Exercise limited by: None identified  Goals    . recover from  accident          Starting 06/03/2016, I will continue to participate in therapy as ordered and use wound vac as prescribed in an effort to continue with recovery from accident.       Fall Risk Fall Risk  06/03/2016  Falls in the past year? No   Depression Screen PHQ 2/9 Scores 06/03/2016  PHQ - 2 Score 0    Cognitive Function MMSE - Mini Mental State Exam 06/03/2016  Orientation to time 5  Orientation to Place 5  Registration 3  Attention/ Calculation 0  Recall 3  Language- name 2 objects 0  Language- repeat 1  Language- follow 3 step command 3  Language- read & follow direction 0  Write a sentence 0  Copy design 0  Total score 20     PLEASE NOTE: A  Mini-Cog screen was completed. Maximum score is 20. A value of 0 denotes this part of [place] MMSE was not completed or the patient failed this part of the Mini-Cog screening.   Mini-Cog Screening Orientation to Time - Max 5 pts Orientation to Place - Max 5 pts Registration - Max 3 pts Recall - Max 3 pts Language Repeat - Max 1 pts Language Follow 3 Step Command - Max 3 pts     Immunization History  Administered Date(s) Administered  . Influenza,inj,Quad PF,36+ Mos 01/15/2016, 03/26/2016  . Pneumococcal Conjugate-13 01/06/2012  . Pneumococcal Polysaccharide-23 12/19/2012  . Td 05/14/2014  . Zoster 12/19/2014   Screening Tests Health Maintenance  Topic Date Due  . OPHTHALMOLOGY EXAM  05/14/2017 (Originally 05/14/2016)  . URINE MICROALBUMIN  06/03/2017 (Originally 07/11/1950)  . HEMOGLOBIN A1C  09/03/2016  . FOOT EXAM  03/26/2017  . COLONOSCOPY  05/15/2023  . TETANUS/TDAP  05/14/2024  . INFLUENZA VACCINE  Completed  . PNA vac Low Risk Adult  Completed      Plan:     I have personally reviewed and addressed the Medicare Annual Wellness questionnaire and have noted the following in the patient's chart:  A. Medical and social history B. Use of alcohol, tobacco or illicit drugs  C. Current medications and  supplements D. Functional ability and status E.  Nutritional status F.  Physical activity G. Advance directives H. List of other physicians I.  Hospitalizations, surgeries, and ER visits in previous 12 months J.  [place] to include hearing, vision, cognitive, depression L. Referrals and appointments - none  In addition, I have reviewed and discussed with patient certain preventive protocols, quality metrics, and best practice recommendations. A written personalized care plan for preventive services as well as general preventive health recommendations were provided to patient.  See attached scanned questionnaire for additional information.   Signed,   Lindell Noe, MHA, BS, LPN Health Coach

## 2016-06-03 NOTE — Progress Notes (Signed)
Pre visit review using our clinic review tool, if applicable. No additional management support is needed unless otherwise documented below in the visit note. 

## 2016-06-04 DIAGNOSIS — L98492 Non-pressure chronic ulcer of skin of other sites with fat layer exposed: Secondary | ICD-10-CM | POA: Diagnosis not present

## 2016-06-04 DIAGNOSIS — S32501D Unspecified fracture of right pubis, subsequent encounter for fracture with routine healing: Secondary | ICD-10-CM | POA: Diagnosis not present

## 2016-06-04 DIAGNOSIS — I10 Essential (primary) hypertension: Secondary | ICD-10-CM | POA: Diagnosis not present

## 2016-06-04 DIAGNOSIS — E1140 Type 2 diabetes mellitus with diabetic neuropathy, unspecified: Secondary | ICD-10-CM | POA: Diagnosis not present

## 2016-06-04 DIAGNOSIS — E1142 Type 2 diabetes mellitus with diabetic polyneuropathy: Secondary | ICD-10-CM | POA: Diagnosis not present

## 2016-06-04 DIAGNOSIS — S3730XD Unspecified injury of urethra, subsequent encounter: Secondary | ICD-10-CM | POA: Diagnosis not present

## 2016-06-04 DIAGNOSIS — S381XXA Crushing injury of abdomen, lower back, and pelvis, initial encounter: Secondary | ICD-10-CM | POA: Diagnosis not present

## 2016-06-04 DIAGNOSIS — D62 Acute posthemorrhagic anemia: Secondary | ICD-10-CM | POA: Diagnosis not present

## 2016-06-04 DIAGNOSIS — L89153 Pressure ulcer of sacral region, stage 3: Secondary | ICD-10-CM | POA: Diagnosis not present

## 2016-06-04 DIAGNOSIS — S32811D Multiple fractures of pelvis with unstable disruption of pelvic ring, subsequent encounter for fracture with routine healing: Secondary | ICD-10-CM | POA: Diagnosis not present

## 2016-06-04 LAB — MICROALBUMIN / CREATININE URINE RATIO
Creatinine,U: 191.1 mg/dL
MICROALB UR: 70.5 mg/dL — AB (ref 0.0–1.9)
Microalb Creat Ratio: 36.9 mg/g — ABNORMAL HIGH (ref 0.0–30.0)

## 2016-06-05 ENCOUNTER — Ambulatory Visit: Payer: Medicare Other

## 2016-06-05 DIAGNOSIS — L89153 Pressure ulcer of sacral region, stage 3: Secondary | ICD-10-CM | POA: Diagnosis not present

## 2016-06-05 DIAGNOSIS — E1142 Type 2 diabetes mellitus with diabetic polyneuropathy: Secondary | ICD-10-CM | POA: Diagnosis not present

## 2016-06-05 DIAGNOSIS — S32811D Multiple fractures of pelvis with unstable disruption of pelvic ring, subsequent encounter for fracture with routine healing: Secondary | ICD-10-CM | POA: Diagnosis not present

## 2016-06-05 DIAGNOSIS — D62 Acute posthemorrhagic anemia: Secondary | ICD-10-CM | POA: Diagnosis not present

## 2016-06-05 DIAGNOSIS — S32501D Unspecified fracture of right pubis, subsequent encounter for fracture with routine healing: Secondary | ICD-10-CM | POA: Diagnosis not present

## 2016-06-05 DIAGNOSIS — S3730XD Unspecified injury of urethra, subsequent encounter: Secondary | ICD-10-CM | POA: Diagnosis not present

## 2016-06-08 DIAGNOSIS — S3730XD Unspecified injury of urethra, subsequent encounter: Secondary | ICD-10-CM | POA: Diagnosis not present

## 2016-06-08 DIAGNOSIS — D62 Acute posthemorrhagic anemia: Secondary | ICD-10-CM | POA: Diagnosis not present

## 2016-06-08 DIAGNOSIS — S32811D Multiple fractures of pelvis with unstable disruption of pelvic ring, subsequent encounter for fracture with routine healing: Secondary | ICD-10-CM | POA: Diagnosis not present

## 2016-06-08 DIAGNOSIS — L89153 Pressure ulcer of sacral region, stage 3: Secondary | ICD-10-CM | POA: Diagnosis not present

## 2016-06-08 DIAGNOSIS — E1142 Type 2 diabetes mellitus with diabetic polyneuropathy: Secondary | ICD-10-CM | POA: Diagnosis not present

## 2016-06-08 DIAGNOSIS — S32501D Unspecified fracture of right pubis, subsequent encounter for fracture with routine healing: Secondary | ICD-10-CM | POA: Diagnosis not present

## 2016-06-09 ENCOUNTER — Ambulatory Visit (INDEPENDENT_AMBULATORY_CARE_PROVIDER_SITE_OTHER): Payer: Medicare Other | Admitting: Family Medicine

## 2016-06-09 ENCOUNTER — Encounter: Payer: Self-pay | Admitting: Family Medicine

## 2016-06-09 VITALS — BP 122/66 | HR 80 | Temp 97.7°F | Wt 208.0 lb

## 2016-06-09 DIAGNOSIS — E1142 Type 2 diabetes mellitus with diabetic polyneuropathy: Secondary | ICD-10-CM | POA: Diagnosis not present

## 2016-06-09 DIAGNOSIS — E1129 Type 2 diabetes mellitus with other diabetic kidney complication: Secondary | ICD-10-CM

## 2016-06-09 DIAGNOSIS — D62 Acute posthemorrhagic anemia: Secondary | ICD-10-CM | POA: Diagnosis not present

## 2016-06-09 DIAGNOSIS — D649 Anemia, unspecified: Secondary | ICD-10-CM | POA: Diagnosis not present

## 2016-06-09 DIAGNOSIS — L89153 Pressure ulcer of sacral region, stage 3: Secondary | ICD-10-CM | POA: Diagnosis not present

## 2016-06-09 DIAGNOSIS — E119 Type 2 diabetes mellitus without complications: Secondary | ICD-10-CM

## 2016-06-09 DIAGNOSIS — R809 Proteinuria, unspecified: Secondary | ICD-10-CM

## 2016-06-09 DIAGNOSIS — E785 Hyperlipidemia, unspecified: Secondary | ICD-10-CM | POA: Diagnosis not present

## 2016-06-09 DIAGNOSIS — S32811D Multiple fractures of pelvis with unstable disruption of pelvic ring, subsequent encounter for fracture with routine healing: Secondary | ICD-10-CM | POA: Diagnosis not present

## 2016-06-09 DIAGNOSIS — S3730XD Unspecified injury of urethra, subsequent encounter: Secondary | ICD-10-CM | POA: Diagnosis not present

## 2016-06-09 DIAGNOSIS — S32501D Unspecified fracture of right pubis, subsequent encounter for fracture with routine healing: Secondary | ICD-10-CM | POA: Diagnosis not present

## 2016-06-09 MED ORDER — INSULIN DETEMIR 100 UNIT/ML ~~LOC~~ SOLN: 15.0000 [IU] | Freq: Every day | SUBCUTANEOUS | 11 refills | Status: DC

## 2016-06-09 MED ORDER — GABAPENTIN 300 MG PO CAPS: 300.0000 mg | ORAL_CAPSULE | Freq: Two times a day (BID) | ORAL | Status: DC

## 2016-06-09 MED ORDER — TRAMADOL HCL 50 MG PO TABS: 50.0000 mg | ORAL_TABLET | Freq: Three times a day (TID) | ORAL | 1 refills | Status: DC | PRN

## 2016-06-09 NOTE — Patient Instructions (Addendum)
Recheck labs in about 3-4 months before a visit.   Take care.  Glad to see you.  Try gradually returning to walking as tolerated.  Likely at some point we'll change amlodipine to lisinopril.   Okay to cut back by 1 unit of insulin a day if needed for low sugars.

## 2016-06-09 NOTE — Progress Notes (Signed)
Diabetes:  Using medications without difficulties: yes Hypoglycemic episodes:no Hyperglycemic episodes:no Feet problems: at baseline Blood Sugars averaging: ~120s in the AM eye exam within last year: f/u pending.  MALB positive.  D/w pt.  See AVS.   A1c at goal. D/w pt.   He has some swelling in the BLE.  Still on amlodipine. Discussed with patient.  He has f/u with urology pending.  Still with wound VAC and suprapubic catheter in place.  Elevated Cholesterol: Diet and exercise d/w pt.   TG elevation noted but not fasting on labs.   LDL at goal.  Labs d/w pt.  No ADE on med.     Anemia.  Iron and HGB improved.  D/w pt.  Not on iron currently and doesn't need to be.    PMH and SH reviewed  Meds, vitals, and allergies reviewed.   ROS: Per HPI unless specifically indicated in ROS section   GEN: nad, alert and oriented HEENT: mucous membranes moist NECK: supple w/o LA CV: rrr. PULM: ctab, no inc wob ABD: soft, +bs EXT: trace BLE edema Wound vac in place.   Suprapubic catheter in place.

## 2016-06-09 NOTE — Progress Notes (Signed)
Pre visit review using our clinic review tool, if applicable. No additional management support is needed unless otherwise documented below in the visit note. 

## 2016-06-10 DIAGNOSIS — S32501D Unspecified fracture of right pubis, subsequent encounter for fracture with routine healing: Secondary | ICD-10-CM | POA: Diagnosis not present

## 2016-06-10 DIAGNOSIS — S32811D Multiple fractures of pelvis with unstable disruption of pelvic ring, subsequent encounter for fracture with routine healing: Secondary | ICD-10-CM | POA: Diagnosis not present

## 2016-06-10 DIAGNOSIS — E1142 Type 2 diabetes mellitus with diabetic polyneuropathy: Secondary | ICD-10-CM | POA: Diagnosis not present

## 2016-06-10 DIAGNOSIS — E1129 Type 2 diabetes mellitus with other diabetic kidney complication: Secondary | ICD-10-CM | POA: Insufficient documentation

## 2016-06-10 DIAGNOSIS — D62 Acute posthemorrhagic anemia: Secondary | ICD-10-CM | POA: Diagnosis not present

## 2016-06-10 DIAGNOSIS — S3730XD Unspecified injury of urethra, subsequent encounter: Secondary | ICD-10-CM | POA: Diagnosis not present

## 2016-06-10 DIAGNOSIS — R809 Proteinuria, unspecified: Secondary | ICD-10-CM

## 2016-06-10 DIAGNOSIS — L89153 Pressure ulcer of sacral region, stage 3: Secondary | ICD-10-CM | POA: Diagnosis not present

## 2016-06-10 DIAGNOSIS — E785 Hyperlipidemia, unspecified: Secondary | ICD-10-CM | POA: Insufficient documentation

## 2016-06-10 NOTE — Assessment & Plan Note (Signed)
He was not fasting. LDL at goal. Continue current medications. He is going to gradually returned exercise, as tolerated. Discussed with patient. We can recheck lipids later on. He agrees.

## 2016-06-10 NOTE — Assessment & Plan Note (Signed)
Discussed with patient. Will need to start ACE inhibitor at some point. I will him to get through his follow-up appointments and we can make adjustments after that. We will likely decrease amlodipine and start an ACE inhibitor. Rationale discussed with patient. Okay not to make the change in medications at this point. Blood sugars have been reasonable recently. A1c is at goal. He agrees.

## 2016-06-10 NOTE — Assessment & Plan Note (Signed)
Iron level improved. Hemoglobin improved. We can recheck this periodically. He is likely repleting his iron stores with diet. He is not needing supplemental iron at this point. >25 minutes spent in face to face time with patient, >50% spent in counselling or coordination of care.

## 2016-06-11 DIAGNOSIS — N359 Urethral stricture, unspecified: Secondary | ICD-10-CM | POA: Diagnosis not present

## 2016-06-11 DIAGNOSIS — S32811D Multiple fractures of pelvis with unstable disruption of pelvic ring, subsequent encounter for fracture with routine healing: Secondary | ICD-10-CM | POA: Diagnosis not present

## 2016-06-11 DIAGNOSIS — S3730XD Unspecified injury of urethra, subsequent encounter: Secondary | ICD-10-CM | POA: Diagnosis not present

## 2016-06-11 DIAGNOSIS — S3210XS Unspecified fracture of sacrum, sequela: Secondary | ICD-10-CM | POA: Diagnosis not present

## 2016-06-11 DIAGNOSIS — E1142 Type 2 diabetes mellitus with diabetic polyneuropathy: Secondary | ICD-10-CM | POA: Diagnosis not present

## 2016-06-11 DIAGNOSIS — Z09 Encounter for follow-up examination after completed treatment for conditions other than malignant neoplasm: Secondary | ICD-10-CM | POA: Diagnosis not present

## 2016-06-11 DIAGNOSIS — L89153 Pressure ulcer of sacral region, stage 3: Secondary | ICD-10-CM | POA: Diagnosis not present

## 2016-06-11 DIAGNOSIS — S32501S Unspecified fracture of right pubis, sequela: Secondary | ICD-10-CM | POA: Diagnosis not present

## 2016-06-11 DIAGNOSIS — D62 Acute posthemorrhagic anemia: Secondary | ICD-10-CM | POA: Diagnosis not present

## 2016-06-11 DIAGNOSIS — Z794 Long term (current) use of insulin: Secondary | ICD-10-CM | POA: Diagnosis not present

## 2016-06-11 DIAGNOSIS — E119 Type 2 diabetes mellitus without complications: Secondary | ICD-10-CM | POA: Diagnosis not present

## 2016-06-11 DIAGNOSIS — I10 Essential (primary) hypertension: Secondary | ICD-10-CM | POA: Diagnosis not present

## 2016-06-11 DIAGNOSIS — S32501D Unspecified fracture of right pubis, subsequent encounter for fracture with routine healing: Secondary | ICD-10-CM | POA: Diagnosis not present

## 2016-06-11 DIAGNOSIS — Z79899 Other long term (current) drug therapy: Secondary | ICD-10-CM | POA: Diagnosis not present

## 2016-06-11 DIAGNOSIS — Z6826 Body mass index (BMI) 26.0-26.9, adult: Secondary | ICD-10-CM | POA: Diagnosis not present

## 2016-06-12 DIAGNOSIS — Z9359 Other cystostomy status: Secondary | ICD-10-CM | POA: Diagnosis not present

## 2016-06-12 DIAGNOSIS — S32501D Unspecified fracture of right pubis, subsequent encounter for fracture with routine healing: Secondary | ICD-10-CM | POA: Diagnosis not present

## 2016-06-12 DIAGNOSIS — Z7984 Long term (current) use of oral hypoglycemic drugs: Secondary | ICD-10-CM | POA: Diagnosis not present

## 2016-06-12 DIAGNOSIS — E1142 Type 2 diabetes mellitus with diabetic polyneuropathy: Secondary | ICD-10-CM | POA: Diagnosis not present

## 2016-06-12 DIAGNOSIS — Z794 Long term (current) use of insulin: Secondary | ICD-10-CM | POA: Diagnosis not present

## 2016-06-12 DIAGNOSIS — L89153 Pressure ulcer of sacral region, stage 3: Secondary | ICD-10-CM | POA: Diagnosis not present

## 2016-06-12 DIAGNOSIS — I10 Essential (primary) hypertension: Secondary | ICD-10-CM | POA: Diagnosis not present

## 2016-06-12 DIAGNOSIS — S32811D Multiple fractures of pelvis with unstable disruption of pelvic ring, subsequent encounter for fracture with routine healing: Secondary | ICD-10-CM | POA: Diagnosis not present

## 2016-06-12 DIAGNOSIS — E785 Hyperlipidemia, unspecified: Secondary | ICD-10-CM | POA: Diagnosis not present

## 2016-06-15 DIAGNOSIS — E119 Type 2 diabetes mellitus without complications: Secondary | ICD-10-CM | POA: Diagnosis not present

## 2016-06-15 DIAGNOSIS — S3739XA Other injury of urethra, initial encounter: Secondary | ICD-10-CM | POA: Diagnosis not present

## 2016-06-15 DIAGNOSIS — E785 Hyperlipidemia, unspecified: Secondary | ICD-10-CM | POA: Diagnosis not present

## 2016-06-15 DIAGNOSIS — N358 Other urethral stricture: Secondary | ICD-10-CM | POA: Diagnosis not present

## 2016-06-15 DIAGNOSIS — N35011 Post-traumatic bulbous urethral stricture: Secondary | ICD-10-CM | POA: Diagnosis not present

## 2016-06-15 DIAGNOSIS — I10 Essential (primary) hypertension: Secondary | ICD-10-CM | POA: Diagnosis not present

## 2016-06-16 ENCOUNTER — Other Ambulatory Visit: Payer: Self-pay | Admitting: *Deleted

## 2016-06-16 DIAGNOSIS — L89153 Pressure ulcer of sacral region, stage 3: Secondary | ICD-10-CM | POA: Diagnosis not present

## 2016-06-16 DIAGNOSIS — E1142 Type 2 diabetes mellitus with diabetic polyneuropathy: Secondary | ICD-10-CM | POA: Diagnosis not present

## 2016-06-16 DIAGNOSIS — I10 Essential (primary) hypertension: Secondary | ICD-10-CM | POA: Diagnosis not present

## 2016-06-16 DIAGNOSIS — S32501D Unspecified fracture of right pubis, subsequent encounter for fracture with routine healing: Secondary | ICD-10-CM | POA: Diagnosis not present

## 2016-06-16 DIAGNOSIS — E785 Hyperlipidemia, unspecified: Secondary | ICD-10-CM | POA: Diagnosis not present

## 2016-06-16 DIAGNOSIS — S32811D Multiple fractures of pelvis with unstable disruption of pelvic ring, subsequent encounter for fracture with routine healing: Secondary | ICD-10-CM | POA: Diagnosis not present

## 2016-06-16 MED ORDER — INSULIN DETEMIR 100 UNIT/ML FLEXPEN: 15.0000 [IU] | PEN_INJECTOR | Freq: Every day | SUBCUTANEOUS | 3 refills | Status: DC

## 2016-06-17 DIAGNOSIS — I10 Essential (primary) hypertension: Secondary | ICD-10-CM | POA: Diagnosis not present

## 2016-06-17 DIAGNOSIS — S32811D Multiple fractures of pelvis with unstable disruption of pelvic ring, subsequent encounter for fracture with routine healing: Secondary | ICD-10-CM | POA: Diagnosis not present

## 2016-06-17 DIAGNOSIS — E1142 Type 2 diabetes mellitus with diabetic polyneuropathy: Secondary | ICD-10-CM | POA: Diagnosis not present

## 2016-06-17 DIAGNOSIS — S32501D Unspecified fracture of right pubis, subsequent encounter for fracture with routine healing: Secondary | ICD-10-CM | POA: Diagnosis not present

## 2016-06-17 DIAGNOSIS — L89153 Pressure ulcer of sacral region, stage 3: Secondary | ICD-10-CM | POA: Diagnosis not present

## 2016-06-17 DIAGNOSIS — E785 Hyperlipidemia, unspecified: Secondary | ICD-10-CM | POA: Diagnosis not present

## 2016-06-18 ENCOUNTER — Encounter (HOSPITAL_BASED_OUTPATIENT_CLINIC_OR_DEPARTMENT_OTHER): Payer: Medicare Other | Attending: Internal Medicine

## 2016-06-18 DIAGNOSIS — I10 Essential (primary) hypertension: Secondary | ICD-10-CM | POA: Diagnosis not present

## 2016-06-18 DIAGNOSIS — L89153 Pressure ulcer of sacral region, stage 3: Secondary | ICD-10-CM | POA: Diagnosis not present

## 2016-06-18 DIAGNOSIS — E1140 Type 2 diabetes mellitus with diabetic neuropathy, unspecified: Secondary | ICD-10-CM | POA: Diagnosis not present

## 2016-06-18 DIAGNOSIS — L98492 Non-pressure chronic ulcer of skin of other sites with fat layer exposed: Secondary | ICD-10-CM | POA: Diagnosis not present

## 2016-06-19 DIAGNOSIS — I10 Essential (primary) hypertension: Secondary | ICD-10-CM | POA: Diagnosis not present

## 2016-06-19 DIAGNOSIS — E785 Hyperlipidemia, unspecified: Secondary | ICD-10-CM | POA: Diagnosis not present

## 2016-06-19 DIAGNOSIS — S32501D Unspecified fracture of right pubis, subsequent encounter for fracture with routine healing: Secondary | ICD-10-CM | POA: Diagnosis not present

## 2016-06-19 DIAGNOSIS — L89153 Pressure ulcer of sacral region, stage 3: Secondary | ICD-10-CM | POA: Diagnosis not present

## 2016-06-19 DIAGNOSIS — E1142 Type 2 diabetes mellitus with diabetic polyneuropathy: Secondary | ICD-10-CM | POA: Diagnosis not present

## 2016-06-19 DIAGNOSIS — S32811D Multiple fractures of pelvis with unstable disruption of pelvic ring, subsequent encounter for fracture with routine healing: Secondary | ICD-10-CM | POA: Diagnosis not present

## 2016-06-22 DIAGNOSIS — L89153 Pressure ulcer of sacral region, stage 3: Secondary | ICD-10-CM | POA: Diagnosis not present

## 2016-06-22 DIAGNOSIS — S32811D Multiple fractures of pelvis with unstable disruption of pelvic ring, subsequent encounter for fracture with routine healing: Secondary | ICD-10-CM | POA: Diagnosis not present

## 2016-06-22 DIAGNOSIS — E1142 Type 2 diabetes mellitus with diabetic polyneuropathy: Secondary | ICD-10-CM | POA: Diagnosis not present

## 2016-06-22 DIAGNOSIS — S32501D Unspecified fracture of right pubis, subsequent encounter for fracture with routine healing: Secondary | ICD-10-CM | POA: Diagnosis not present

## 2016-06-22 DIAGNOSIS — E785 Hyperlipidemia, unspecified: Secondary | ICD-10-CM | POA: Diagnosis not present

## 2016-06-22 DIAGNOSIS — I10 Essential (primary) hypertension: Secondary | ICD-10-CM | POA: Diagnosis not present

## 2016-06-23 DIAGNOSIS — S32501D Unspecified fracture of right pubis, subsequent encounter for fracture with routine healing: Secondary | ICD-10-CM | POA: Diagnosis not present

## 2016-06-23 DIAGNOSIS — E785 Hyperlipidemia, unspecified: Secondary | ICD-10-CM | POA: Diagnosis not present

## 2016-06-23 DIAGNOSIS — S32811D Multiple fractures of pelvis with unstable disruption of pelvic ring, subsequent encounter for fracture with routine healing: Secondary | ICD-10-CM | POA: Diagnosis not present

## 2016-06-23 DIAGNOSIS — L89153 Pressure ulcer of sacral region, stage 3: Secondary | ICD-10-CM | POA: Diagnosis not present

## 2016-06-23 DIAGNOSIS — E1142 Type 2 diabetes mellitus with diabetic polyneuropathy: Secondary | ICD-10-CM | POA: Diagnosis not present

## 2016-06-23 DIAGNOSIS — I10 Essential (primary) hypertension: Secondary | ICD-10-CM | POA: Diagnosis not present

## 2016-06-25 DIAGNOSIS — E1142 Type 2 diabetes mellitus with diabetic polyneuropathy: Secondary | ICD-10-CM | POA: Diagnosis not present

## 2016-06-25 DIAGNOSIS — S32811D Multiple fractures of pelvis with unstable disruption of pelvic ring, subsequent encounter for fracture with routine healing: Secondary | ICD-10-CM | POA: Diagnosis not present

## 2016-06-25 DIAGNOSIS — S32501D Unspecified fracture of right pubis, subsequent encounter for fracture with routine healing: Secondary | ICD-10-CM | POA: Diagnosis not present

## 2016-06-25 DIAGNOSIS — E785 Hyperlipidemia, unspecified: Secondary | ICD-10-CM | POA: Diagnosis not present

## 2016-06-25 DIAGNOSIS — I10 Essential (primary) hypertension: Secondary | ICD-10-CM | POA: Diagnosis not present

## 2016-06-25 DIAGNOSIS — L89153 Pressure ulcer of sacral region, stage 3: Secondary | ICD-10-CM | POA: Diagnosis not present

## 2016-06-26 DIAGNOSIS — L89153 Pressure ulcer of sacral region, stage 3: Secondary | ICD-10-CM | POA: Diagnosis not present

## 2016-06-26 DIAGNOSIS — Z79899 Other long term (current) drug therapy: Secondary | ICD-10-CM | POA: Diagnosis not present

## 2016-06-26 DIAGNOSIS — Z794 Long term (current) use of insulin: Secondary | ICD-10-CM | POA: Diagnosis not present

## 2016-06-26 DIAGNOSIS — T83518A Infection and inflammatory reaction due to other urinary catheter, initial encounter: Secondary | ICD-10-CM | POA: Diagnosis not present

## 2016-06-26 DIAGNOSIS — S32501D Unspecified fracture of right pubis, subsequent encounter for fracture with routine healing: Secondary | ICD-10-CM | POA: Diagnosis not present

## 2016-06-26 DIAGNOSIS — E1142 Type 2 diabetes mellitus with diabetic polyneuropathy: Secondary | ICD-10-CM | POA: Diagnosis not present

## 2016-06-26 DIAGNOSIS — E1165 Type 2 diabetes mellitus with hyperglycemia: Secondary | ICD-10-CM | POA: Diagnosis not present

## 2016-06-26 DIAGNOSIS — S32811D Multiple fractures of pelvis with unstable disruption of pelvic ring, subsequent encounter for fracture with routine healing: Secondary | ICD-10-CM | POA: Diagnosis not present

## 2016-06-26 DIAGNOSIS — E785 Hyperlipidemia, unspecified: Secondary | ICD-10-CM | POA: Diagnosis not present

## 2016-06-26 DIAGNOSIS — Z4802 Encounter for removal of sutures: Secondary | ICD-10-CM | POA: Diagnosis not present

## 2016-06-26 DIAGNOSIS — M791 Myalgia: Secondary | ICD-10-CM | POA: Diagnosis not present

## 2016-06-26 DIAGNOSIS — Z87448 Personal history of other diseases of urinary system: Secondary | ICD-10-CM | POA: Diagnosis not present

## 2016-06-26 DIAGNOSIS — E871 Hypo-osmolality and hyponatremia: Secondary | ICD-10-CM | POA: Diagnosis not present

## 2016-06-26 DIAGNOSIS — I10 Essential (primary) hypertension: Secondary | ICD-10-CM | POA: Diagnosis not present

## 2016-06-29 ENCOUNTER — Other Ambulatory Visit: Payer: Self-pay | Admitting: Family Medicine

## 2016-07-02 DIAGNOSIS — L98492 Non-pressure chronic ulcer of skin of other sites with fat layer exposed: Secondary | ICD-10-CM | POA: Diagnosis not present

## 2016-07-02 DIAGNOSIS — E1140 Type 2 diabetes mellitus with diabetic neuropathy, unspecified: Secondary | ICD-10-CM | POA: Diagnosis not present

## 2016-07-02 DIAGNOSIS — L98422 Non-pressure chronic ulcer of back with fat layer exposed: Secondary | ICD-10-CM | POA: Diagnosis not present

## 2016-07-02 DIAGNOSIS — I10 Essential (primary) hypertension: Secondary | ICD-10-CM | POA: Diagnosis not present

## 2016-07-03 DIAGNOSIS — I10 Essential (primary) hypertension: Secondary | ICD-10-CM | POA: Diagnosis not present

## 2016-07-03 DIAGNOSIS — S32501D Unspecified fracture of right pubis, subsequent encounter for fracture with routine healing: Secondary | ICD-10-CM | POA: Diagnosis not present

## 2016-07-03 DIAGNOSIS — E785 Hyperlipidemia, unspecified: Secondary | ICD-10-CM | POA: Diagnosis not present

## 2016-07-03 DIAGNOSIS — E1142 Type 2 diabetes mellitus with diabetic polyneuropathy: Secondary | ICD-10-CM | POA: Diagnosis not present

## 2016-07-03 DIAGNOSIS — L89153 Pressure ulcer of sacral region, stage 3: Secondary | ICD-10-CM | POA: Diagnosis not present

## 2016-07-03 DIAGNOSIS — S32811D Multiple fractures of pelvis with unstable disruption of pelvic ring, subsequent encounter for fracture with routine healing: Secondary | ICD-10-CM | POA: Diagnosis not present

## 2016-07-06 ENCOUNTER — Inpatient Hospital Stay (HOSPITAL_COMMUNITY): Admission: RE | Admit: 2016-07-06 | Payer: Medicare Other | Source: Ambulatory Visit

## 2016-07-09 DIAGNOSIS — N200 Calculus of kidney: Secondary | ICD-10-CM | POA: Diagnosis not present

## 2016-07-09 DIAGNOSIS — E1165 Type 2 diabetes mellitus with hyperglycemia: Secondary | ICD-10-CM | POA: Diagnosis not present

## 2016-07-09 DIAGNOSIS — E871 Hypo-osmolality and hyponatremia: Secondary | ICD-10-CM | POA: Diagnosis not present

## 2016-07-09 DIAGNOSIS — E785 Hyperlipidemia, unspecified: Secondary | ICD-10-CM | POA: Diagnosis not present

## 2016-07-09 DIAGNOSIS — M791 Myalgia: Secondary | ICD-10-CM | POA: Diagnosis not present

## 2016-07-09 DIAGNOSIS — S3730XD Unspecified injury of urethra, subsequent encounter: Secondary | ICD-10-CM | POA: Diagnosis not present

## 2016-07-09 DIAGNOSIS — K635 Polyp of colon: Secondary | ICD-10-CM | POA: Diagnosis not present

## 2016-07-09 DIAGNOSIS — N359 Urethral stricture, unspecified: Secondary | ICD-10-CM | POA: Diagnosis not present

## 2016-07-09 DIAGNOSIS — N179 Acute kidney failure, unspecified: Secondary | ICD-10-CM | POA: Diagnosis not present

## 2016-07-09 DIAGNOSIS — I10 Essential (primary) hypertension: Secondary | ICD-10-CM | POA: Diagnosis not present

## 2016-07-09 DIAGNOSIS — S329XXD Fracture of unspecified parts of lumbosacral spine and pelvis, subsequent encounter for fracture with routine healing: Secondary | ICD-10-CM | POA: Diagnosis not present

## 2016-07-09 DIAGNOSIS — S32811D Multiple fractures of pelvis with unstable disruption of pelvic ring, subsequent encounter for fracture with routine healing: Secondary | ICD-10-CM | POA: Diagnosis not present

## 2016-07-09 DIAGNOSIS — L89154 Pressure ulcer of sacral region, stage 4: Secondary | ICD-10-CM | POA: Diagnosis not present

## 2016-07-16 ENCOUNTER — Encounter (HOSPITAL_BASED_OUTPATIENT_CLINIC_OR_DEPARTMENT_OTHER): Payer: Medicare Other | Attending: Internal Medicine

## 2016-07-16 DIAGNOSIS — E1140 Type 2 diabetes mellitus with diabetic neuropathy, unspecified: Secondary | ICD-10-CM | POA: Insufficient documentation

## 2016-07-16 DIAGNOSIS — L98499 Non-pressure chronic ulcer of skin of other sites with unspecified severity: Secondary | ICD-10-CM | POA: Insufficient documentation

## 2016-07-16 DIAGNOSIS — Z794 Long term (current) use of insulin: Secondary | ICD-10-CM | POA: Diagnosis not present

## 2016-07-16 DIAGNOSIS — L98429 Non-pressure chronic ulcer of back with unspecified severity: Secondary | ICD-10-CM | POA: Diagnosis not present

## 2016-07-16 DIAGNOSIS — I10 Essential (primary) hypertension: Secondary | ICD-10-CM | POA: Diagnosis not present

## 2016-07-20 DIAGNOSIS — S32811D Multiple fractures of pelvis with unstable disruption of pelvic ring, subsequent encounter for fracture with routine healing: Secondary | ICD-10-CM | POA: Diagnosis not present

## 2016-07-24 DIAGNOSIS — Z79899 Other long term (current) drug therapy: Secondary | ICD-10-CM | POA: Diagnosis not present

## 2016-07-24 DIAGNOSIS — Z01818 Encounter for other preprocedural examination: Secondary | ICD-10-CM | POA: Diagnosis not present

## 2016-07-24 DIAGNOSIS — I10 Essential (primary) hypertension: Secondary | ICD-10-CM | POA: Diagnosis not present

## 2016-07-24 DIAGNOSIS — N359 Urethral stricture, unspecified: Secondary | ICD-10-CM | POA: Diagnosis not present

## 2016-07-24 DIAGNOSIS — Z452 Encounter for adjustment and management of vascular access device: Secondary | ICD-10-CM | POA: Diagnosis not present

## 2016-07-24 DIAGNOSIS — E119 Type 2 diabetes mellitus without complications: Secondary | ICD-10-CM | POA: Diagnosis not present

## 2016-08-03 DIAGNOSIS — N359 Urethral stricture, unspecified: Secondary | ICD-10-CM | POA: Diagnosis not present

## 2016-08-06 DIAGNOSIS — Z794 Long term (current) use of insulin: Secondary | ICD-10-CM | POA: Diagnosis not present

## 2016-08-06 DIAGNOSIS — I10 Essential (primary) hypertension: Secondary | ICD-10-CM | POA: Diagnosis not present

## 2016-08-06 DIAGNOSIS — L98499 Non-pressure chronic ulcer of skin of other sites with unspecified severity: Secondary | ICD-10-CM | POA: Diagnosis not present

## 2016-08-06 DIAGNOSIS — E1140 Type 2 diabetes mellitus with diabetic neuropathy, unspecified: Secondary | ICD-10-CM | POA: Diagnosis not present

## 2016-08-06 DIAGNOSIS — L98421 Non-pressure chronic ulcer of back limited to breakdown of skin: Secondary | ICD-10-CM | POA: Diagnosis not present

## 2016-08-07 DIAGNOSIS — N35012 Post-traumatic membranous urethral stricture: Secondary | ICD-10-CM | POA: Diagnosis not present

## 2016-08-07 DIAGNOSIS — N3289 Other specified disorders of bladder: Secondary | ICD-10-CM | POA: Diagnosis present

## 2016-08-07 DIAGNOSIS — N359 Urethral stricture, unspecified: Secondary | ICD-10-CM | POA: Diagnosis not present

## 2016-08-07 DIAGNOSIS — S3739XA Other injury of urethra, initial encounter: Secondary | ICD-10-CM | POA: Diagnosis not present

## 2016-08-07 DIAGNOSIS — E119 Type 2 diabetes mellitus without complications: Secondary | ICD-10-CM | POA: Diagnosis present

## 2016-08-07 DIAGNOSIS — D62 Acute posthemorrhagic anemia: Secondary | ICD-10-CM | POA: Diagnosis not present

## 2016-08-07 DIAGNOSIS — Z794 Long term (current) use of insulin: Secondary | ICD-10-CM | POA: Diagnosis not present

## 2016-08-07 DIAGNOSIS — S32811S Multiple fractures of pelvis with unstable disruption of pelvic ring, sequela: Secondary | ICD-10-CM | POA: Diagnosis not present

## 2016-08-07 DIAGNOSIS — I10 Essential (primary) hypertension: Secondary | ICD-10-CM | POA: Diagnosis present

## 2016-08-07 DIAGNOSIS — E785 Hyperlipidemia, unspecified: Secondary | ICD-10-CM | POA: Diagnosis present

## 2016-08-15 ENCOUNTER — Other Ambulatory Visit: Payer: Self-pay | Admitting: Family Medicine

## 2016-08-24 DIAGNOSIS — N369 Urethral disorder, unspecified: Secondary | ICD-10-CM | POA: Diagnosis not present

## 2016-08-24 DIAGNOSIS — Z9889 Other specified postprocedural states: Secondary | ICD-10-CM | POA: Diagnosis not present

## 2016-08-24 DIAGNOSIS — N359 Urethral stricture, unspecified: Secondary | ICD-10-CM | POA: Diagnosis not present

## 2016-08-24 DIAGNOSIS — D500 Iron deficiency anemia secondary to blood loss (chronic): Secondary | ICD-10-CM | POA: Diagnosis not present

## 2016-08-26 ENCOUNTER — Encounter: Payer: Self-pay | Admitting: Family Medicine

## 2016-08-26 ENCOUNTER — Ambulatory Visit (INDEPENDENT_AMBULATORY_CARE_PROVIDER_SITE_OTHER): Payer: Medicare Other | Admitting: Family Medicine

## 2016-08-26 VITALS — BP 124/68 | HR 88 | Temp 97.8°F | Wt 208.0 lb

## 2016-08-26 DIAGNOSIS — E1142 Type 2 diabetes mellitus with diabetic polyneuropathy: Secondary | ICD-10-CM

## 2016-08-26 DIAGNOSIS — E119 Type 2 diabetes mellitus without complications: Secondary | ICD-10-CM

## 2016-08-26 DIAGNOSIS — I10 Essential (primary) hypertension: Secondary | ICD-10-CM | POA: Diagnosis not present

## 2016-08-26 DIAGNOSIS — D62 Acute posthemorrhagic anemia: Secondary | ICD-10-CM

## 2016-08-26 MED ORDER — INSULIN DETEMIR 100 UNIT/ML FLEXPEN: 10.0000 [IU] | PEN_INJECTOR | Freq: Every day | SUBCUTANEOUS | 3 refills | Status: DC

## 2016-08-26 NOTE — Progress Notes (Signed)
S/p suprapubic cath removal and surgery at [facility] 07/2016.  Post op anemia noted but recheck Hgb up to 10.2.  He is working to regain continence.  Not burning with urination.  He still has burning with sitting, but that is likely an ongoing issue due to nerve irritation from prev procedures and injuries.  He is able to tolerate that.  No skin breakdown.  He graduated from the wound clinic.    H/o DM2.  His sugar has been ~130s, lower, down to 100.   D/w pt about recheck A1c, along with other labs.  Had been on losartan but not now.  D/w pt.  BP is still reasonable and we can follow along re: BP and MALB.    PMH and SH reviewed  ROS: Per HPI unless specifically indicated in ROS section   Meds, vitals, and allergies reviewed.   nad ncat rrr ctab Abd soft, not ttp, suprapubic cath site CDI Normal BS Ext w/o edema

## 2016-08-26 NOTE — Patient Instructions (Signed)
Don't change your meds for now.   Recheck labs (blood and urine) in about 1 month.   We'll go from there. Take care.  Glad to see you.

## 2016-08-27 ENCOUNTER — Encounter: Payer: Self-pay | Admitting: Family Medicine

## 2016-08-27 ENCOUNTER — Encounter (HOSPITAL_BASED_OUTPATIENT_CLINIC_OR_DEPARTMENT_OTHER): Payer: Medicare Other

## 2016-08-27 NOTE — Assessment & Plan Note (Signed)
We can recheck MALB later on and follow BP as is for now.  He agrees.

## 2016-08-27 NOTE — Assessment & Plan Note (Signed)
Recheck labs in about 1 month.  No change in meds for now.  He agrees.

## 2016-08-27 NOTE — Assessment & Plan Note (Signed)
Improved on recent check.  Recheck in about 1 month.  No ongoing known blood loss.  >25 minutes spent in face to face time with patient, >50% spent in counselling or coordination of care.

## 2016-08-28 ENCOUNTER — Other Ambulatory Visit: Payer: Self-pay

## 2016-08-28 MED ORDER — GLUCOSE BLOOD VI STRP: ORAL_STRIP | 3 refills | Status: DC

## 2016-08-28 NOTE — Telephone Encounter (Signed)
Rx sent electronically.  

## 2016-09-01 ENCOUNTER — Other Ambulatory Visit: Payer: Self-pay

## 2016-09-01 MED ORDER — GLUCOSE BLOOD VI STRP: ORAL_STRIP | 3 refills | Status: DC

## 2016-09-01 NOTE — Telephone Encounter (Signed)
Pt is going to get the test strips at [place] and these cannot be transferred. Advised pt sent new rx for test strips to [facility]. Pt voiced understanding and is appreciative.

## 2016-09-04 ENCOUNTER — Other Ambulatory Visit: Payer: Self-pay | Admitting: Family Medicine

## 2016-09-08 ENCOUNTER — Other Ambulatory Visit: Payer: Self-pay | Admitting: Family Medicine

## 2016-11-02 ENCOUNTER — Ambulatory Visit (INDEPENDENT_AMBULATORY_CARE_PROVIDER_SITE_OTHER): Payer: Medicare Other | Admitting: Family Medicine

## 2016-11-02 ENCOUNTER — Encounter: Payer: Self-pay | Admitting: Family Medicine

## 2016-11-02 VITALS — BP 132/72 | HR 88 | Temp 97.7°F | Wt 229.0 lb

## 2016-11-02 DIAGNOSIS — E119 Type 2 diabetes mellitus without complications: Secondary | ICD-10-CM

## 2016-11-02 DIAGNOSIS — R609 Edema, unspecified: Secondary | ICD-10-CM | POA: Diagnosis not present

## 2016-11-02 DIAGNOSIS — D649 Anemia, unspecified: Secondary | ICD-10-CM | POA: Diagnosis not present

## 2016-11-02 DIAGNOSIS — E1129 Type 2 diabetes mellitus with other diabetic kidney complication: Secondary | ICD-10-CM

## 2016-11-02 DIAGNOSIS — R809 Proteinuria, unspecified: Secondary | ICD-10-CM | POA: Diagnosis not present

## 2016-11-02 LAB — CBC WITH DIFFERENTIAL/PLATELET
BASOS PCT: 0.6 % (ref 0.0–3.0)
Basophils Absolute: 0 10*3/uL (ref 0.0–0.1)
EOS ABS: 0.3 10*3/uL (ref 0.0–0.7)
Eosinophils Relative: 3.8 % (ref 0.0–5.0)
HEMATOCRIT: 38.5 % — AB (ref 39.0–52.0)
HEMOGLOBIN: 12.5 g/dL — AB (ref 13.0–17.0)
LYMPHS PCT: 27.9 % (ref 12.0–46.0)
Lymphs Abs: 1.9 10*3/uL (ref 0.7–4.0)
MCHC: 32.6 g/dL (ref 30.0–36.0)
MCV: 89.2 fl (ref 78.0–100.0)
MONOS PCT: 7.3 % (ref 3.0–12.0)
Monocytes Absolute: 0.5 10*3/uL (ref 0.1–1.0)
NEUTROS ABS: 4.2 10*3/uL (ref 1.4–7.7)
Neutrophils Relative %: 60.4 % (ref 43.0–77.0)
PLATELETS: 177 10*3/uL (ref 150.0–400.0)
RBC: 4.32 Mil/uL (ref 4.22–5.81)
RDW: 14.9 % (ref 11.5–15.5)
WBC: 7 10*3/uL (ref 4.0–10.5)

## 2016-11-02 LAB — COMPREHENSIVE METABOLIC PANEL
ALBUMIN: 4.3 g/dL (ref 3.5–5.2)
ALT: 21 U/L (ref 0–53)
AST: 22 U/L (ref 0–37)
Alkaline Phosphatase: 79 U/L (ref 39–117)
BILIRUBIN TOTAL: 0.6 mg/dL (ref 0.2–1.2)
BUN: 15 mg/dL (ref 6–23)
CHLORIDE: 102 meq/L (ref 96–112)
CO2: 26 meq/L (ref 19–32)
CREATININE: 1.1 mg/dL (ref 0.40–1.50)
Calcium: 9.5 mg/dL (ref 8.4–10.5)
GFR: 69.3 mL/min (ref >60.00)
Glucose, Bld: 207 mg/dL — ABNORMAL HIGH (ref 70–99)
Potassium: 4.5 mEq/L (ref 3.5–5.1)
Sodium: 139 mEq/L (ref 135–145)
Total Protein: 7 g/dL (ref 6.0–8.3)

## 2016-11-02 LAB — IBC PANEL
Iron: 65 ug/dL (ref 42–165)
SATURATION RATIOS: 13.7 % — AB (ref 20.0–50.0)
TRANSFERRIN: 340 mg/dL (ref 212.0–360.0)

## 2016-11-02 LAB — MICROALBUMIN / CREATININE URINE RATIO
Creatinine,U: 89.6 mg/dL
Microalb Creat Ratio: 2.7 mg/g (ref 0.0–30.0)
Microalb, Ur: 2.4 mg/dL — ABNORMAL HIGH (ref 0.0–1.9)

## 2016-11-02 LAB — HEMOGLOBIN A1C: Hgb A1c MFr Bld: 7.1 % — ABNORMAL HIGH (ref 4.6–6.5)

## 2016-11-02 MED ORDER — AMLODIPINE BESYLATE 10 MG PO TABS: 5.0000 mg | ORAL_TABLET | Freq: Every day | ORAL | Status: DC

## 2016-11-02 NOTE — Patient Instructions (Signed)
Go to the lab on the way out.  We'll contact you with your lab report. Cut back on salt and cut your amlodipine in half, down to 5mg  a day.  Update me about the swelling and your BP in about 1 week.  We'll go from there.  Take care.  Glad to see you.

## 2016-11-02 NOTE — Assessment & Plan Note (Signed)
Recheck routine labs today.  No change in DM2 meds at this point. See notes on labs.

## 2016-11-02 NOTE — Progress Notes (Signed)
Sugar recently 120-200 in the AM, usually on the lower end of the range, diet dependent.   BLE edema.  On amlodipine.  Worse at the end of the day.  He is recovering in general from prev injuries but his weight is up with inc in intake, according to patient.    He is gradually working back to his baseline exercise with gradual distance increased on the treadmill.  No CP, SOB.  He is using exercise bike some.    He has some numbness on the B lateral thighs, that has happened since/with his injury and surgery.  He prev had his back injected for bulging discs.  He can still feel the floor well, ie not limiting gait safety.  No focal weakness.   Meds, vitals, and allergies reviewed.   ROS: Per HPI unless specifically indicated in ROS section   Physical Exam  GEN: nad, alert and oriented HEENT: mucous membranes moist NECK: supple w/o LA CV: rrr. PULM: ctab, no inc wob ABD: soft, +bs EXT: 1+ BLE edema SKIN: no acute rash  Diabetic foot exam: Normal inspection No skin breakdown Calluses noted Normal DP pulses Normal sensation to light touch and monofilament Nails thickened.

## 2016-11-02 NOTE — Assessment & Plan Note (Signed)
Unclear if from amlodipine, salt load, inc in intake overall, or combination, d/w pt.  Ctab, okay for outpatient f/u.  Doesn't appear to be in significant failure.  Cut amlodipine back to 5mg  a day and update me re: BP and edema.  See notes on labs.  Cut back on salt.  He agrees.

## 2016-11-04 MED ORDER — BUPIVACAINE-EPINEPHRINE (PF) 0.5% -1:200000 IJ SOLN
INTRAMUSCULAR | Status: AC
  Filled 2016-11-04: qty 30

## 2016-11-11 ENCOUNTER — Other Ambulatory Visit: Payer: Self-pay | Admitting: Orthopedic Surgery

## 2016-11-11 DIAGNOSIS — S32811D Multiple fractures of pelvis with unstable disruption of pelvic ring, subsequent encounter for fracture with routine healing: Secondary | ICD-10-CM | POA: Diagnosis not present

## 2016-11-11 DIAGNOSIS — M7989 Other specified soft tissue disorders: Secondary | ICD-10-CM | POA: Diagnosis not present

## 2016-11-11 DIAGNOSIS — M5416 Radiculopathy, lumbar region: Secondary | ICD-10-CM | POA: Diagnosis not present

## 2016-11-11 DIAGNOSIS — R2243 Localized swelling, mass and lump, lower limb, bilateral: Secondary | ICD-10-CM

## 2016-11-12 DIAGNOSIS — R339 Retention of urine, unspecified: Secondary | ICD-10-CM | POA: Diagnosis not present

## 2016-11-12 DIAGNOSIS — N390 Urinary tract infection, site not specified: Secondary | ICD-10-CM | POA: Diagnosis not present

## 2016-11-13 ENCOUNTER — Other Ambulatory Visit: Payer: Self-pay | Admitting: Orthopedic Surgery

## 2016-11-13 ENCOUNTER — Ambulatory Visit
Admission: RE | Admit: 2016-11-13 | Discharge: 2016-11-13 | Disposition: A | Discharge status: Home / Self Care | Payer: Medicare Other | Source: Ambulatory Visit | Attending: Orthopedic Surgery | Admitting: Orthopedic Surgery

## 2016-11-13 ENCOUNTER — Other Ambulatory Visit: Payer: Self-pay | Admitting: Family Medicine

## 2016-11-13 DIAGNOSIS — R600 Localized edema: Secondary | ICD-10-CM | POA: Diagnosis not present

## 2016-11-13 DIAGNOSIS — R2243 Localized swelling, mass and lump, lower limb, bilateral: Secondary | ICD-10-CM

## 2016-11-13 DIAGNOSIS — M5416 Radiculopathy, lumbar region: Secondary | ICD-10-CM

## 2016-11-19 ENCOUNTER — Ambulatory Visit
Admission: RE | Admit: 2016-11-19 | Discharge: 2016-11-19 | Disposition: A | Discharge status: Home / Self Care | Payer: Medicare Other | Source: Ambulatory Visit | Attending: Orthopedic Surgery | Admitting: Orthopedic Surgery

## 2016-11-19 DIAGNOSIS — M48061 Spinal stenosis, lumbar region without neurogenic claudication: Secondary | ICD-10-CM | POA: Diagnosis not present

## 2016-11-19 DIAGNOSIS — M5416 Radiculopathy, lumbar region: Secondary | ICD-10-CM

## 2016-12-01 ENCOUNTER — Other Ambulatory Visit: Payer: Self-pay | Admitting: Family Medicine

## 2016-12-04 DIAGNOSIS — E119 Type 2 diabetes mellitus without complications: Secondary | ICD-10-CM | POA: Diagnosis not present

## 2016-12-04 DIAGNOSIS — Q5520 Unspecified congenital malformations of testis and scrotum: Secondary | ICD-10-CM | POA: Diagnosis not present

## 2016-12-04 DIAGNOSIS — N471 Phimosis: Secondary | ICD-10-CM | POA: Diagnosis not present

## 2016-12-04 DIAGNOSIS — N5082 Scrotal pain: Secondary | ICD-10-CM | POA: Diagnosis not present

## 2016-12-04 DIAGNOSIS — R300 Dysuria: Secondary | ICD-10-CM | POA: Diagnosis not present

## 2016-12-04 DIAGNOSIS — E785 Hyperlipidemia, unspecified: Secondary | ICD-10-CM | POA: Diagnosis not present

## 2016-12-04 DIAGNOSIS — N4889 Other specified disorders of penis: Secondary | ICD-10-CM | POA: Diagnosis not present

## 2016-12-04 DIAGNOSIS — N50819 Testicular pain, unspecified: Secondary | ICD-10-CM | POA: Diagnosis not present

## 2016-12-04 DIAGNOSIS — Z9889 Other specified postprocedural states: Secondary | ICD-10-CM | POA: Diagnosis not present

## 2016-12-04 DIAGNOSIS — I10 Essential (primary) hypertension: Secondary | ICD-10-CM | POA: Diagnosis not present

## 2016-12-07 ENCOUNTER — Other Ambulatory Visit: Payer: Self-pay | Admitting: Orthopedic Surgery

## 2016-12-07 DIAGNOSIS — M48061 Spinal stenosis, lumbar region without neurogenic claudication: Secondary | ICD-10-CM

## 2016-12-10 ENCOUNTER — Ambulatory Visit
Admission: RE | Admit: 2016-12-10 | Discharge: 2016-12-10 | Disposition: A | Discharge status: Home / Self Care | Payer: Medicare Other | Source: Ambulatory Visit | Attending: Orthopedic Surgery | Admitting: Orthopedic Surgery

## 2016-12-10 DIAGNOSIS — M545 Low back pain: Secondary | ICD-10-CM | POA: Diagnosis not present

## 2016-12-10 DIAGNOSIS — M48061 Spinal stenosis, lumbar region without neurogenic claudication: Secondary | ICD-10-CM

## 2016-12-10 MED ORDER — METHYLPREDNISOLONE ACETATE 40 MG/ML INJ SUSP (RADIOLOG
120.0000 mg | Freq: Once | INTRAMUSCULAR | Status: AC
  Administered 2016-12-10: 120 mg via EPIDURAL

## 2016-12-10 MED ORDER — IOPAMIDOL (ISOVUE-M 200) INJECTION 41%
1.0000 mL | Freq: Once | INTRAMUSCULAR | Status: AC
  Administered 2016-12-10: 1 mL via EPIDURAL

## 2016-12-10 NOTE — Discharge Instructions (Signed)

## 2016-12-15 ENCOUNTER — Other Ambulatory Visit: Payer: Self-pay | Admitting: Family Medicine

## 2016-12-31 DIAGNOSIS — N529 Male erectile dysfunction, unspecified: Secondary | ICD-10-CM | POA: Diagnosis not present

## 2016-12-31 DIAGNOSIS — Z48816 Encounter for surgical aftercare following surgery on the genitourinary system: Secondary | ICD-10-CM | POA: Diagnosis not present

## 2016-12-31 DIAGNOSIS — Z6829 Body mass index (BMI) 29.0-29.9, adult: Secondary | ICD-10-CM | POA: Diagnosis not present

## 2017-01-14 DIAGNOSIS — N5201 Erectile dysfunction due to arterial insufficiency: Secondary | ICD-10-CM | POA: Diagnosis not present

## 2017-01-14 DIAGNOSIS — Z6829 Body mass index (BMI) 29.0-29.9, adult: Secondary | ICD-10-CM | POA: Diagnosis not present

## 2017-01-24 ENCOUNTER — Other Ambulatory Visit: Payer: Self-pay | Admitting: Family Medicine

## 2017-01-24 DIAGNOSIS — E1142 Type 2 diabetes mellitus with diabetic polyneuropathy: Secondary | ICD-10-CM

## 2017-01-26 ENCOUNTER — Other Ambulatory Visit (INDEPENDENT_AMBULATORY_CARE_PROVIDER_SITE_OTHER): Payer: Medicare Other

## 2017-01-26 DIAGNOSIS — E1142 Type 2 diabetes mellitus with diabetic polyneuropathy: Secondary | ICD-10-CM | POA: Diagnosis not present

## 2017-01-26 LAB — HEMOGLOBIN A1C: HEMOGLOBIN A1C: 7.7 % — AB (ref 4.6–6.5)

## 2017-01-29 DIAGNOSIS — M109 Gout, unspecified: Secondary | ICD-10-CM | POA: Diagnosis not present

## 2017-02-02 ENCOUNTER — Encounter: Payer: Self-pay | Admitting: Family Medicine

## 2017-02-02 ENCOUNTER — Ambulatory Visit (INDEPENDENT_AMBULATORY_CARE_PROVIDER_SITE_OTHER): Payer: Medicare Other | Admitting: Family Medicine

## 2017-02-02 VITALS — BP 140/78 | HR 85 | Temp 98.0°F | Wt 236.5 lb

## 2017-02-02 DIAGNOSIS — E1142 Type 2 diabetes mellitus with diabetic polyneuropathy: Secondary | ICD-10-CM | POA: Diagnosis not present

## 2017-02-02 DIAGNOSIS — M25519 Pain in unspecified shoulder: Secondary | ICD-10-CM | POA: Diagnosis not present

## 2017-02-02 DIAGNOSIS — Z23 Encounter for immunization: Secondary | ICD-10-CM | POA: Diagnosis not present

## 2017-02-02 NOTE — Progress Notes (Signed)
Diabetes:  Using medications without difficulties:yes Hypoglycemic episodes:no Hyperglycemic episodes:no Feet problems:no Blood Sugars averaging: usually ~200 in the last week, but that has been atypical.  Usually lower, previously.  eye exam within last year: due, d/w pt.   A1c 7.7.    He had used topical applications prev but then had R shoulder injected with relief per ortho.  Injection was years ago.  He has has intermittent pain laying on R shoulder at night.  His ROM is still intact but does sometimes have pain with overhead movement.   He had epidural injection done 11/2016.  He has f/u with spine clinic pending.  The injection helped his back.    PMH and SH reviewed  Meds, vitals, and allergies reviewed.   ROS: Per HPI unless specifically indicated in ROS section   GEN: nad, alert and oriented HEENT: mucous membranes moist NECK: supple w/o LA CV: rrr. PULM: ctab, no inc wob ABD: soft, +bs EXT: trace BLE edema SKIN: no acute rash Normal ROM R shoulder, no impingement.    Diabetic foot exam: Normal inspection No skin breakdown No calluses  Normal DP pulses Normal sensation to light touch and but dec monofilament sensation on the R foot Nails thickened.

## 2017-02-02 NOTE — Patient Instructions (Signed)
Call about an eye exam when possible.  I'll check on options for your right shoulder pain.  If your AM sugar is above 140, then add 1 unit of insulin that day.  If 110-140, then no change in dose.  If below 110, then decrease by 1 unit.  Recheck A1c in about 3 months.  Take care.  Glad to see you.

## 2017-02-03 DIAGNOSIS — M25519 Pain in unspecified shoulder: Secondary | ICD-10-CM | POA: Insufficient documentation

## 2017-02-03 NOTE — Assessment & Plan Note (Signed)
See AVS.  Adjust insulin.  If your AM sugar is above 140, then add 1 unit of insulin that day.  If 110-140, then no change in dose.  If below 110, then decrease by 1 unit.  Recheck A1c in about 3 months.  Continue work on diet and exercise.

## 2017-02-03 NOTE — Assessment & Plan Note (Addendum)
He had used topical applications prev but then had R shoulder injected with relief per ortho.  Injection was years ago.  He has has intermittent pain laying on R shoulder at night.  His ROM is still intact but does sometimes have pain with overhead movement.  I'll check to see if [facility] clinic can see patient.

## 2017-02-08 ENCOUNTER — Ambulatory Visit (INDEPENDENT_AMBULATORY_CARE_PROVIDER_SITE_OTHER): Payer: Medicare Other | Admitting: Family Medicine

## 2017-02-08 ENCOUNTER — Ambulatory Visit (INDEPENDENT_AMBULATORY_CARE_PROVIDER_SITE_OTHER)
Admission: RE | Admit: 2017-02-08 | Discharge: 2017-02-08 | Disposition: A | Discharge status: Home / Self Care | Payer: Medicare Other | Source: Ambulatory Visit | Attending: Family Medicine | Admitting: Family Medicine

## 2017-02-08 ENCOUNTER — Encounter: Payer: Self-pay | Admitting: Family Medicine

## 2017-02-08 VITALS — BP 140/60 | HR 81 | Temp 97.6°F | Ht 74.0 in | Wt 239.8 lb

## 2017-02-08 DIAGNOSIS — G8929 Other chronic pain: Secondary | ICD-10-CM

## 2017-02-08 DIAGNOSIS — M19011 Primary osteoarthritis, right shoulder: Secondary | ICD-10-CM

## 2017-02-08 DIAGNOSIS — M25511 Pain in right shoulder: Secondary | ICD-10-CM

## 2017-02-08 MED ORDER — METHYLPREDNISOLONE ACETATE 40 MG/ML IJ SUSP
80.0000 mg | Freq: Once | INTRAMUSCULAR | Status: AC
  Administered 2017-02-08: 80 mg via INTRA_ARTICULAR

## 2017-02-08 NOTE — Progress Notes (Signed)
Dr. Frederico Hamman T. Glynna Failla, MD, [facility] [place], [address] Phone: [phone] Fax: [phone]  02/08/2017  Patient: Christian [name], MRN: [id number], DOB: March 02, 1941, 76 y.o.  Primary Physician:  Tonia Ghent, MD   Chief Complaint  Patient presents with  . Shoulder Pain    Right   Subjective:   Christian [name] is a 76 y.o. very pleasant male patient who presents with the following:  The patient noted above presents with shoulder pain that has been ongoing for 3 years off and on. there is no history of trauma or accident. The patient denies neck pain or radicular symptoms. Denies dislocation, subluxation, separation of the shoulder. The patient does complain of pain in the overhead plane.  Medications Tried: Compounded topicals, motrin Ice or Heat: y Tried PT: with trauma  Prior shoulder Injury: Not to the R shoulder   Was a Games developer for many years, used to throw baseball, and for about a month and could not really raise it up. Lives in [place], and also got some ESI.   Saw Dr. Sabino Niemann. Moving his shoulder.   R Shoulder intraa inj  Past Medical History, Surgical History, Social History, Family History, Problem List, Medications, and Allergies have been reviewed and updated if relevant.  Patient Active Problem List   Diagnosis Date Noted  . Shoulder pain 02/03/2017  . Edema 11/02/2016  . Diabetes mellitus with microalbuminuria ([place]) 06/10/2016  . HLD (hyperlipidemia) 06/10/2016  . Pressure injury of skin 03/06/2016  . DM type 2 with diabetic peripheral neuropathy ([place])   . Constipation due to pain medication   . Benign essential HTN   . Post-operative pain   . Sacral decubitus ulcer 01/17/2016  . Accident caused by farm tractor 01/15/2016  . Urethral injury 01/15/2016  . Acute blood loss anemia 01/15/2016  . Acute kidney injury ([place]) 01/15/2016  . Multiple pelvic fractures 01/10/2016     Past Medical History:  Diagnosis Date  . Crushing injury of pelvic region   . Diabetes mellitus without complication ([place])   . Hyperlipidemia   . Hypertension     Past Surgical History:  Procedure Laterality Date  . ACHILLES TENDON SURGERY Right   . APPLICATION OF A-CELL OF BACK N/A 02/10/2016   Procedure: APPLICATION OF A-CELL OF sacral wound;  Surgeon: Loel Lofty Dillingham, DO;  Location: [facility];  Service: Plastics;  Laterality: N/A;  . APPLICATION OF WOUND VAC N/A 02/10/2016   Procedure: APPLICATION OF WOUND VAC possible;  Surgeon: Loel Lofty Dillingham, DO;  Location: [facility];  Service: Plastics;  Laterality: N/A;  . I&D EXTREMITY N/A 01/30/2016   Procedure: IRRIGATION AND DEBRIDEMENT OF THE SACRUM;  Surgeon: Loel Lofty Dillingham, DO;  Location: [facility];  Service: Plastics;  Laterality: N/A;  . INCISION AND DRAINAGE OF WOUND N/A 02/10/2016   Procedure: IRRIGATION AND DEBRIDEMENT sacral WOUND;  Surgeon: Loel Lofty Dillingham, DO;  Location: [facility];  Service: Plastics;  Laterality: N/A;  . IR GENERIC HISTORICAL  02/11/2016   IR CATHETER TUBE CHANGE 02/11/2016 Aletta Edouard, MD [facility]-INTERV RAD  . IR GENERIC HISTORICAL  03/16/2016   IR CATHETER TUBE CHANGE 03/16/2016 Sandi Mariscal, MD [facility]-INTER[facility]  . IR GENERIC HISTORICAL  05/11/2016   IR CATHETER TUBE CHANGE 05/11/2016 Aletta Edouard, MD [facility]  . SACRO-ILIAC PINNING N/A 01/16/2016   Procedure: Jenetta Downer;  Surgeon: Altamese Lasara, MD;  Location: [facility];  Service: Orthopedics;  Laterality:  N/A;  . SUPRAPUBIC CATHETER INSERTION     s/p removal 2018  . TONSILLECTOMY    . ULNAR NERVE TRANSPOSITION Right     Social History   Social History  . Marital status: Married    Spouse name: N/A  . Number of children: N/A  . Years of education: N/A   Occupational History  . Not on file.   Social History Main Topics  . Smoking status: Never Smoker  . Smokeless tobacco: Never Used  . Alcohol use No  . Drug use: No  . Sexual  activity: Not Currently   Other Topics Concern  . Not on file   Social History Narrative   Married 2015   Retired, previous mission work   Engineer, materials    Family History  Problem Relation Age of Onset  . Pancreatic cancer Mother   . CVA Father   . Dementia Father   . Diabetes Neg Hx     Allergies  Allergen Reactions  . No Known Allergies     Medication list reviewed and updated in full in [facility].  GEN: No fevers, chills. Nontoxic. Primarily MSK c/o today. MSK: Detailed in the HPI GI: tolerating PO intake without difficulty Neuro: No numbness, parasthesias, or tingling associated. Otherwise the pertinent positives of the ROS are noted above.   Objective:   BP 140/60   Pulse 81   Temp 97.6 F (36.4 C) (Oral)   Ht 6\' 2"  (1.88 m)   Wt 239 lb 12 oz (108.7 kg)   BMI 30.78 kg/m    GEN: WDWN, NAD, Non-toxic, Alert & Oriented x 3 HEENT: Atraumatic, Normocephalic.  Ears and Nose: No external deformity. EXTR: No clubbing/cyanosis/edema NEURO: Normal gait.  PSYCH: Normally interactive. Conversant. Not depressed or anxious appearing.  Calm demeanor.   Shoulder: R Inspection: No muscle wasting or winging CREPITUS WITH MOTION Ecchymosis/edema: neg  AC joint, scapula, clavicle: NT Cervical spine: NT, full ROM Spurling's: neg Abduction: full, 5/5 Flexion: full, 5/5 IR, full, lift-off: 5/5 ER at neutral: full, 5/5 AC crossover and compression: neg Neer: neg Hawkins: neg Drop Test: neg Empty Can: neg Supraspinatus insertion: NT Bicipital groove: NT Speed's: neg Yergason's: neg Sulcus sign: neg Scapular dyskinesis: none C5-T1 intact Sensation intact Grip 5/5   Radiology: Dg Shoulder Right  Result Date: 02/08/2017 CLINICAL DATA:  Chronic right shoulder pain. EXAM: RIGHT SHOULDER - 2+ VIEW COMPARISON:  None. FINDINGS: There is no evidence of fracture or dislocation. Severe degenerative changes seen involving the right acromioclavicular  joint. Mild degenerative joint disease is seen involving the right glenohumeral joint. Soft tissues are unremarkable. IMPRESSION: Degenerative joint disease is seen involving the right glenohumeral and acromioclavicular joints. No acute abnormality seen in the right shoulder. Electronically Signed   By: Marijo Conception, M.D.   On: 02/08/2017 10:34     Assessment and Plan:   Osteoarthritis of glenohumeral joint, right  Chronic right shoulder pain - Plan: DG Shoulder Right, methylPREDNISolone acetate (DEPO-MEDROL) injection 80 mg   Primary issue here is one of OA, AC and GH. Symptomatic Gh OA - I did an intraarticular injection. 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 Risks including infection explained and contrasted with benefits and alternatives. Patient prepped with Chloraprep and Ethyl Chloride used for anesthesia. An intraarticular shoulder injection was performed using the posterior approach.  The patient tolerated the procedure well and had decreased pain post injection. No complications. Injection: 8 cc of Lidocaine 1% and 2 mL Depo-Medrol 40 mg. Needle: 22 gauge   Follow-up: prn only  Meds ordered this encounter  Medications  . methylPREDNISolone acetate (DEPO-MEDROL) injection 80 mg   There are no discontinued medications. Orders Placed This Encounter  Procedures  . DG Shoulder Right    Signed,  Frederico Hamman T. Nayel Purdy, MD   Allergies as of 02/08/2017      Reactions   No Known Allergies       Medication List       Accurate as of 02/08/17 10:48 AM. Always use your most recent med list.          amLODipine 10 MG tablet Commonly known as:  NORVASC Take 0.5 tablets (5 mg total) by mouth daily.   ascorbic acid 500 MG tablet Commonly known as:  VITAMIN C Take 1 tablet (500 mg total) by mouth 2 (two) times  daily.   atorvastatin 80 MG tablet Commonly known as:  LIPITOR TAKE 1 TABLET BY MOUTH DAILY AT 6 PM.   Cinnamon 500 MG capsule Take 500 mg by mouth 2 (two) times daily.   FLONASE 50 MCG/ACT nasal spray Generic drug:  fluticasone Place 2 sprays into both nostrils daily as needed for allergies or rhinitis.   gabapentin 300 MG capsule Commonly known as:  NEURONTIN Take one capsule by mouth every morning and 2 capsules by mouth at bedtime.   glipiZIDE 5 MG tablet Commonly known as:  GLUCOTROL TAKE 2 TABLETS BY MOUTH EVERY MORNING AND 1 TABLET AT NIGHT   glucose blood test strip Commonly known as:  ONE TOUCH ULTRA TEST Use to check blood sugar once a day. Dx Code E11.42   ibuprofen 200 MG tablet Commonly known as:  ADVIL,MOTRIN Take 200 mg by mouth every 6 (six) hours as needed.   indomethacin 50 MG capsule Commonly known as:  INDOCIN Take 50 mg by mouth 3 (three) times daily between meals as needed.   Insulin Detemir 100 UNIT/ML Pen Commonly known as:  LEVEMIR FLEXTOUCH Inject 10 Units into the skin daily at 10 pm.   metFORMIN 1000 MG tablet Commonly known as:  GLUCOPHAGE TAKE 1 TABLET BY MOUTH 2 TIMES DAILY.   pantoprazole 40 MG tablet Commonly known as:  PROTONIX TAKE 1 TABLET BY MOUTH 2 TIMES DAILY.

## 2017-02-12 ENCOUNTER — Other Ambulatory Visit: Payer: Self-pay | Admitting: Family Medicine

## 2017-02-12 MED ORDER — PEN NEEDLES 31G X 8 MM MISC: 1 refills | Status: DC

## 2017-02-12 NOTE — Telephone Encounter (Signed)
Copied from [facility] [id number]. Topic: Quick Communication - See Telephone Encounter >> Feb 12, 2017 10:36 AM Ether Griffins B wrote: CRM for notification. See Telephone encounter for:  02/12/17. Refill request pen needles medicare part b so [facility] pharmacy will need diag. Code and frequencies and refill 5 or less

## 2017-02-12 NOTE — Telephone Encounter (Signed)
Refill done and pts wife notified to ck with [facility].

## 2017-02-15 ENCOUNTER — Other Ambulatory Visit: Payer: Self-pay | Admitting: *Deleted

## 2017-02-15 MED ORDER — PEN NEEDLES 31G X 8 MM MISC: 1 refills | Status: DC

## 2017-02-18 ENCOUNTER — Other Ambulatory Visit: Payer: Self-pay | Admitting: *Deleted

## 2017-02-18 DIAGNOSIS — D1801 Hemangioma of skin and subcutaneous tissue: Secondary | ICD-10-CM | POA: Diagnosis not present

## 2017-02-18 DIAGNOSIS — L821 Other seborrheic keratosis: Secondary | ICD-10-CM | POA: Diagnosis not present

## 2017-02-18 DIAGNOSIS — L570 Actinic keratosis: Secondary | ICD-10-CM | POA: Diagnosis not present

## 2017-02-18 DIAGNOSIS — D225 Melanocytic nevi of trunk: Secondary | ICD-10-CM | POA: Diagnosis not present

## 2017-02-18 DIAGNOSIS — L820 Inflamed seborrheic keratosis: Secondary | ICD-10-CM | POA: Diagnosis not present

## 2017-03-10 DIAGNOSIS — M47816 Spondylosis without myelopathy or radiculopathy, lumbar region: Secondary | ICD-10-CM | POA: Diagnosis not present

## 2017-03-10 DIAGNOSIS — S381XXS Crushing injury of abdomen, lower back, and pelvis, sequela: Secondary | ICD-10-CM | POA: Diagnosis not present

## 2017-04-01 DIAGNOSIS — L821 Other seborrheic keratosis: Secondary | ICD-10-CM | POA: Diagnosis not present

## 2017-04-01 DIAGNOSIS — D225 Melanocytic nevi of trunk: Secondary | ICD-10-CM | POA: Diagnosis not present

## 2017-04-01 DIAGNOSIS — L578 Other skin changes due to chronic exposure to nonionizing radiation: Secondary | ICD-10-CM | POA: Diagnosis not present

## 2017-04-12 ENCOUNTER — Other Ambulatory Visit: Payer: Self-pay | Admitting: Family Medicine

## 2017-05-25 DIAGNOSIS — D225 Melanocytic nevi of trunk: Secondary | ICD-10-CM | POA: Diagnosis not present

## 2017-05-25 DIAGNOSIS — L570 Actinic keratosis: Secondary | ICD-10-CM | POA: Diagnosis not present

## 2017-05-25 DIAGNOSIS — L820 Inflamed seborrheic keratosis: Secondary | ICD-10-CM | POA: Diagnosis not present

## 2017-05-25 DIAGNOSIS — L821 Other seborrheic keratosis: Secondary | ICD-10-CM | POA: Diagnosis not present

## 2017-05-26 ENCOUNTER — Ambulatory Visit (INDEPENDENT_AMBULATORY_CARE_PROVIDER_SITE_OTHER): Payer: Medicare Other | Admitting: Family Medicine

## 2017-05-26 ENCOUNTER — Encounter: Payer: Self-pay | Admitting: Family Medicine

## 2017-05-26 ENCOUNTER — Other Ambulatory Visit: Payer: Self-pay

## 2017-05-26 VITALS — BP 130/64 | HR 81 | Temp 98.0°F | Ht 74.0 in | Wt 240.0 lb

## 2017-05-26 DIAGNOSIS — M19011 Primary osteoarthritis, right shoulder: Secondary | ICD-10-CM

## 2017-05-26 DIAGNOSIS — M25511 Pain in right shoulder: Secondary | ICD-10-CM

## 2017-05-26 DIAGNOSIS — G8929 Other chronic pain: Secondary | ICD-10-CM | POA: Diagnosis not present

## 2017-05-26 MED ORDER — METHYLPREDNISOLONE ACETATE 40 MG/ML IJ SUSP
80.0000 mg | Freq: Once | INTRAMUSCULAR | Status: AC
  Administered 2017-05-26: 80 mg via INTRA_ARTICULAR

## 2017-05-26 NOTE — Progress Notes (Signed)
Dr. Frederico Hamman T. Joyceline Maiorino, MD, [facility] [place], [address] Phone: [phone] Fax: [phone]  05/26/2017  Patient: Christian [name], MRN: [id number], DOB: 18-Apr-1940, 77 y.o.  Primary Physician:  Tonia Ghent, MD   Chief Complaint  Patient presents with  . Shoulder Pain    Right   Subjective:   Christian [name] is a 77 y.o. very pleasant male patient who presents with the following:  OA GH joint on the R: Very pleasant gentleman that I would call well who presents with right-sided shoulder pain after he was moving some equipment in the snow and strained his shoulder.  He has some modified range of motion, but range of motion is full, with minor pain.  He does have some known glenohumeral arthritis on the right.  Moving equipment after snow, strained shoulder, rom ok  R Hodges Inj  Past Medical History, Surgical History, Social History, Family History, Problem List, Medications, and Allergies have been reviewed and updated if relevant.  Patient Active Problem List   Diagnosis Date Noted  . Shoulder pain 02/03/2017  . Edema 11/02/2016  . Diabetes mellitus with microalbuminuria ([place]) 06/10/2016  . HLD (hyperlipidemia) 06/10/2016  . Pressure injury of skin 03/06/2016  . DM type 2 with diabetic peripheral neuropathy ([place])   . Constipation due to pain medication   . Benign essential HTN   . Post-operative pain   . Sacral decubitus ulcer 01/17/2016  . Accident caused by farm tractor 01/15/2016  . Urethral injury 01/15/2016  . Acute blood loss anemia 01/15/2016  . Acute kidney injury ([place]) 01/15/2016  . Multiple pelvic fractures 01/10/2016    Past Medical History:  Diagnosis Date  . Crushing injury of pelvic region   . Diabetes mellitus without complication ([place])   . Hyperlipidemia   . Hypertension     Past Surgical History:  Procedure Laterality Date  . ACHILLES TENDON SURGERY Right   . APPLICATION OF  A-CELL OF BACK N/A 02/10/2016   Procedure: APPLICATION OF A-CELL OF sacral wound;  Surgeon: Loel Lofty Dillingham, DO;  Location: [facility];  Service: Plastics;  Laterality: N/A;  . APPLICATION OF WOUND VAC N/A 02/10/2016   Procedure: APPLICATION OF WOUND VAC possible;  Surgeon: Loel Lofty Dillingham, DO;  Location: [facility];  Service: Plastics;  Laterality: N/A;  . I&D EXTREMITY N/A 01/30/2016   Procedure: IRRIGATION AND DEBRIDEMENT OF THE SACRUM;  Surgeon: Loel Lofty Dillingham, DO;  Location: [facility];  Service: Plastics;  Laterality: N/A;  . INCISION AND DRAINAGE OF WOUND N/A 02/10/2016   Procedure: IRRIGATION AND DEBRIDEMENT sacral WOUND;  Surgeon: Loel Lofty Dillingham, DO;  Location: [facility];  Service: Plastics;  Laterality: N/A;  . IR GENERIC HISTORICAL  02/11/2016   IR CATHETER TUBE CHANGE 02/11/2016 Aletta Edouard, MD [facility]INTER[facility]  . IR GENERIC HISTORICAL  03/16/2016   IR CATHETER TUBE CHANGE 03/16/2016 Sandi Mariscal, MD [facility]  . IR GENERIC HISTORICAL  05/11/2016   IR CATHETER TUBE CHANGE 05/11/2016 Aletta Edouard, MD [facility]  . SACRO-ILIAC PINNING N/A 01/16/2016   Procedure: Jenetta Downer;  Surgeon: Altamese Milford, MD;  Location: [facility];  Service: Orthopedics;  Laterality: N/A;  . SUPRAPUBIC CATHETER INSERTION     s/p removal 2018  . TONSILLECTOMY    . ULNAR NERVE TRANSPOSITION Right     Social History   Socioeconomic History  . Marital status: Married    Spouse name: Not on file  .  Number of children: Not on file  . Years of education: Not on file  . Highest education level: Not on file  Social Needs  . Financial resource strain: Not on file  . Food insecurity - worry: Not on file  . Food insecurity - inability: Not on file  . Transportation needs - medical: Not on file  . Transportation needs - non-medical: Not on file  Occupational History  . Not on file  Tobacco Use  . Smoking status: Never Smoker  . Smokeless tobacco: Never Used  Substance and Sexual Activity  .  Alcohol use: No  . Drug use: No  . Sexual activity: Not Currently  Other Topics Concern  . Not on file  Social History Narrative   Married 2015   Retired, previous mission work   Engineer, materials    Family History  Problem Relation Age of Onset  . Pancreatic cancer Mother   . CVA Father   . Dementia Father   . Diabetes Neg Hx     Allergies  Allergen Reactions  . No Known Allergies     Medication list reviewed and updated in full in [place].  GEN: No fevers, chills. Nontoxic. Primarily MSK c/o today. MSK: Detailed in the HPI GI: tolerating PO intake without difficulty Neuro: No numbness, parasthesias, or tingling associated. Otherwise the pertinent positives of the ROS are noted above.   Objective:   BP 130/64   Pulse 81   Temp 98 F (36.7 C) (Oral)   Ht 6\' 2"  (1.88 m)   Wt 240 lb (108.9 kg)   BMI 30.81 kg/m    GEN: WDWN, NAD, Non-toxic, Alert & Oriented x 3 HEENT: Atraumatic, Normocephalic.  Ears and Nose: No external deformity. EXTR: No clubbing/cyanosis/edema NEURO: Normal gait.  PSYCH: Normally interactive. Conversant. Not depressed or anxious appearing.  Calm demeanor.   Shoulder: R Inspection: No muscle wasting or winging Ecchymosis/edema: neg  AC joint, scapula, clavicle: NT Cervical spine: NT, full ROM Spurling's: neg Abduction: full, 5/5, mildly painful arc Flexion: full, 5/5 IR, full, lift-off: 5/5 ER at neutral: full, 5/5 AC crossover and compression: + + crepitus Neer: neg Hawkins: neg Drop Test: neg Empty Can: neg Supraspinatus insertion: NT Bicipital groove: NT Speed's: neg Yergason's: neg Sulcus sign: neg Scapular dyskinesis: none C5-T1 intact Sensation intact Grip 5/5   Radiology: No results found.  Assessment and Plan:   Osteoarthritis of glenohumeral joint, right  Chronic right shoulder pain - Plan: methylPREDNISolone acetate (DEPO-MEDROL) injection 80 mg   Shoulder strain with moving equipment  repetitively.  His strength is still intact.  Probable exacerbation of underlying arthritis as well.  Intrarticular Shoulder Injection, R Verbal consent was obtained from the patient. Risks including infection explained and contrasted with benefits and alternatives. Patient prepped with Chloraprep and Ethyl Chloride used for anesthesia. An intraarticular shoulder injection was performed using the posterior approach. The patient tolerated the procedure well and had decreased pain post injection. No complications. Injection: 8 cc of Lidocaine 1% and 2 mL Depo-Medrol 40 mg. Needle: 22 gauge   Follow-up: No Follow-up on file.  Meds ordered this encounter  Medications  . methylPREDNISolone acetate (DEPO-MEDROL) injection 80 mg   Signed,  Lakai Moree T. Aibhlinn Kalmar, MD   Allergies as of 05/26/2017      Reactions   No Known Allergies       Medication List        Accurate as of 05/26/17  5:51 PM. Always use your most recent med list.  amLODipine 10 MG tablet Commonly known as:  NORVASC Take 0.5 tablets (5 mg total) by mouth daily.   ascorbic acid 500 MG tablet Commonly known as:  VITAMIN C Take 1 tablet (500 mg total) by mouth 2 (two) times daily.   atorvastatin 80 MG tablet Commonly known as:  LIPITOR TAKE 1 TABLET BY MOUTH DAILY AT 6 PM.   Cinnamon 500 MG capsule Take 500 mg by mouth 2 (two) times daily.   FLONASE 50 MCG/ACT nasal spray Generic drug:  fluticasone Place 2 sprays into both nostrils daily as needed for allergies or rhinitis.   gabapentin 300 MG capsule Commonly known as:  NEURONTIN Take one capsule by mouth every morning and 2 capsules by mouth at bedtime.   glipiZIDE 5 MG tablet Commonly known as:  GLUCOTROL TAKE 2 TABLETS BY MOUTH EVERY MORNING AND 1 TABLET AT NIGHT   glucose blood test strip Commonly known as:  ONE TOUCH ULTRA TEST Use to check blood sugar once a day. Dx Code E11.42   ibuprofen 200 MG tablet Commonly known as:   ADVIL,MOTRIN Take 200 mg by mouth every 6 (six) hours as needed.   indomethacin 50 MG capsule Commonly known as:  INDOCIN Take 50 mg by mouth 3 (three) times daily between meals as needed.   Insulin Detemir 100 UNIT/ML Pen Commonly known as:  LEVEMIR FLEXTOUCH Inject 10 Units into the skin daily at 10 pm.   metFORMIN 1000 MG tablet Commonly known as:  GLUCOPHAGE TAKE 1 TABLET BY MOUTH 2 TIMES DAILY.   pantoprazole 40 MG tablet Commonly known as:  PROTONIX TAKE 1 TABLET BY MOUTH 2 TIMES DAILY.   Pen Needles 31G X 8 MM Misc Use to inject Levemir daily. Dx E11.42   Insulin dependent.

## 2017-05-31 ENCOUNTER — Other Ambulatory Visit: Payer: Self-pay | Admitting: Family Medicine

## 2017-06-05 ENCOUNTER — Other Ambulatory Visit: Payer: Self-pay | Admitting: Family Medicine

## 2017-07-01 ENCOUNTER — Other Ambulatory Visit: Payer: Self-pay

## 2017-07-01 ENCOUNTER — Telehealth: Payer: Self-pay | Admitting: Family Medicine

## 2017-07-01 MED ORDER — GLIPIZIDE 5 MG PO TABS: ORAL_TABLET | ORAL | 1 refills | Status: DC

## 2017-07-01 NOTE — Telephone Encounter (Signed)
Copied from [place] [id number]. Topic: Quick Communication - See Telephone Encounter >> Jul 01, 2017 11:05 AM Ivar Drape wrote: CRM for notification. See Telephone encounter for: 07/01/17. Levada Dy w/[facility] [phone] is taking over the patient's prescriptions from [facility] and they need the following prescription sent to them: glipiZIDE (GLUCOTROL) 5 MG tablet

## 2017-07-12 ENCOUNTER — Telehealth: Payer: Self-pay | Admitting: Family Medicine

## 2017-07-12 NOTE — Telephone Encounter (Signed)
Copied from [place] [id number]. Topic: Quick Communication - Rx Refill/Question >> Jul 12, 2017  5:55 PM Neva Seat wrote: amLODipine (NORVASC) 10 MG tablet  Pharmacy is needing a Rx because this a new Rx.  Pt wants [facility] to start filling his Rx.  [place], [place] - [address] [place] [address] Phone: [phone] Fax: [phone]

## 2017-07-13 ENCOUNTER — Encounter: Payer: Self-pay | Admitting: Family Medicine

## 2017-07-13 MED ORDER — AMLODIPINE BESYLATE 10 MG PO TABS: 5.0000 mg | ORAL_TABLET | Freq: Every day | ORAL | 5 refills | Status: DC

## 2017-07-13 NOTE — Telephone Encounter (Signed)
This encounter was created in error - please disregard.

## 2017-07-19 ENCOUNTER — Encounter: Payer: Self-pay | Admitting: Family Medicine

## 2017-07-19 ENCOUNTER — Ambulatory Visit (INDEPENDENT_AMBULATORY_CARE_PROVIDER_SITE_OTHER): Payer: Medicare Other | Admitting: Family Medicine

## 2017-07-19 VITALS — BP 132/70 | HR 98 | Temp 98.3°F | Wt 235.8 lb

## 2017-07-19 DIAGNOSIS — E1142 Type 2 diabetes mellitus with diabetic polyneuropathy: Secondary | ICD-10-CM

## 2017-07-19 DIAGNOSIS — E1129 Type 2 diabetes mellitus with other diabetic kidney complication: Secondary | ICD-10-CM

## 2017-07-19 DIAGNOSIS — R809 Proteinuria, unspecified: Secondary | ICD-10-CM

## 2017-07-19 LAB — BASIC METABOLIC PANEL
BUN: 13 mg/dL (ref 6–23)
CALCIUM: 8.7 mg/dL (ref 8.4–10.5)
CO2: 26 mEq/L (ref 19–32)
Chloride: 105 mEq/L (ref 96–112)
Creatinine, Ser: 1.32 mg/dL (ref 0.40–1.50)
GFR: 56.04 mL/min — ABNORMAL LOW (ref >60.00)
Glucose, Bld: 222 mg/dL — ABNORMAL HIGH (ref 70–99)
Potassium: 4.2 mEq/L (ref 3.5–5.1)
SODIUM: 142 meq/L (ref 135–145)

## 2017-07-19 LAB — HEMOGLOBIN A1C: Hgb A1c MFr Bld: 8.2 % — ABNORMAL HIGH (ref 4.6–6.5)

## 2017-07-19 MED ORDER — INSULIN DETEMIR 100 UNIT/ML FLEXPEN: PEN_INJECTOR | SUBCUTANEOUS | 5 refills | Status: DC

## 2017-07-19 NOTE — Progress Notes (Signed)
Diabetes:  Using medications without difficulties: yes, except see below Hypoglycemic episodes: no Hyperglycemic episodes:no Feet problems: he has some B foot edema at baseline.  He still on gabapentin with some numbness in the thighs at baseline.  He doesn't have full sensation in the feet.   Blood Sugars averaging: 130-160s usually.  eye exam within last year: d/w pt.   Pending.  He was giving 12-14 units of insulin a day until he ran out a few days ago. Due for labs.    Meds, vitals, and allergies reviewed.  ROS: Per HPI unless specifically indicated in ROS section   GEN: nad, alert and oriented HEENT: mucous membranes moist NECK: supple w/o LA CV: rrr. PULM: ctab, no inc wob ABD: soft, +bs EXT: no edema  Diabetic foot exam: Normal inspection No skin breakdown Callus noted on L foot at 1st MTP Normal DP pulses Dec but not absent sensation to light touch and monofilament Nails thickened.

## 2017-07-19 NOTE — Patient Instructions (Addendum)
I'll await the eye exam report.   Go to the lab on the way out.  We'll contact you with your lab report.  Recheck labs prior to a physical in about 4-5 months.   Take care.  Glad to see you.  Update me as needed.   Thank you for your effort.

## 2017-07-20 NOTE — Assessment & Plan Note (Signed)
He is trying to be as active as possible.  He has made significant improvement over the past year.  No change in medicines at this point other than to refill his insulin since he had recently run out.  Discussed with patient about diet and exercise.  Recheck labs today.  See notes on labs.  See after visit summary.

## 2017-08-30 ENCOUNTER — Other Ambulatory Visit: Payer: Self-pay | Admitting: *Deleted

## 2017-08-30 MED ORDER — GABAPENTIN 300 MG PO CAPS: ORAL_CAPSULE | ORAL | 1 refills | Status: DC

## 2017-08-30 MED ORDER — ATORVASTATIN CALCIUM 80 MG PO TABS: ORAL_TABLET | ORAL | 1 refills | Status: DC

## 2017-08-30 MED ORDER — GLIPIZIDE 5 MG PO TABS: ORAL_TABLET | ORAL | 1 refills | Status: DC

## 2017-10-06 ENCOUNTER — Ambulatory Visit: Payer: Medicare Other | Admitting: Family Medicine

## 2017-10-08 ENCOUNTER — Other Ambulatory Visit: Payer: Self-pay | Admitting: Orthopedic Surgery

## 2017-10-08 DIAGNOSIS — M48061 Spinal stenosis, lumbar region without neurogenic claudication: Secondary | ICD-10-CM

## 2017-10-15 ENCOUNTER — Ambulatory Visit
Admission: RE | Admit: 2017-10-15 | Discharge: 2017-10-15 | Disposition: A | Discharge status: Home / Self Care | Payer: Medicare Other | Source: Ambulatory Visit | Attending: Orthopedic Surgery | Admitting: Orthopedic Surgery

## 2017-10-15 DIAGNOSIS — M47817 Spondylosis without myelopathy or radiculopathy, lumbosacral region: Secondary | ICD-10-CM | POA: Diagnosis not present

## 2017-10-15 DIAGNOSIS — M48061 Spinal stenosis, lumbar region without neurogenic claudication: Secondary | ICD-10-CM

## 2017-10-15 MED ORDER — IOPAMIDOL (ISOVUE-M 200) INJECTION 41%
1.0000 mL | Freq: Once | INTRAMUSCULAR | Status: AC
  Administered 2017-10-15: 1 mL via EPIDURAL

## 2017-10-15 MED ORDER — METHYLPREDNISOLONE ACETATE 40 MG/ML INJ SUSP (RADIOLOG
120.0000 mg | Freq: Once | INTRAMUSCULAR | Status: AC
  Administered 2017-10-15: 120 mg via EPIDURAL

## 2017-10-15 NOTE — Discharge Instructions (Signed)

## 2017-10-30 ENCOUNTER — Other Ambulatory Visit: Payer: Self-pay | Admitting: Family Medicine

## 2017-11-01 ENCOUNTER — Other Ambulatory Visit: Payer: Self-pay | Admitting: Family Medicine

## 2017-11-01 MED ORDER — METFORMIN HCL 1000 MG PO TABS: 1000.0000 mg | ORAL_TABLET | Freq: Two times a day (BID) | ORAL | 1 refills | Status: DC

## 2017-11-01 NOTE — Progress Notes (Signed)
Metformin sent, advised patient to schedule a physical when possible.

## 2017-11-04 ENCOUNTER — Other Ambulatory Visit: Payer: Self-pay | Admitting: Family Medicine

## 2017-11-11 ENCOUNTER — Other Ambulatory Visit: Payer: Self-pay

## 2017-11-16 DIAGNOSIS — H2513 Age-related nuclear cataract, bilateral: Secondary | ICD-10-CM | POA: Diagnosis not present

## 2017-11-16 DIAGNOSIS — E119 Type 2 diabetes mellitus without complications: Secondary | ICD-10-CM | POA: Diagnosis not present

## 2017-11-16 LAB — HM DIABETES EYE EXAM

## 2017-11-17 DIAGNOSIS — L570 Actinic keratosis: Secondary | ICD-10-CM | POA: Diagnosis not present

## 2017-11-17 DIAGNOSIS — D485 Neoplasm of uncertain behavior of skin: Secondary | ICD-10-CM | POA: Diagnosis not present

## 2017-11-17 DIAGNOSIS — L821 Other seborrheic keratosis: Secondary | ICD-10-CM | POA: Diagnosis not present

## 2017-11-17 DIAGNOSIS — L578 Other skin changes due to chronic exposure to nonionizing radiation: Secondary | ICD-10-CM | POA: Diagnosis not present

## 2017-11-17 DIAGNOSIS — C44319 Basal cell carcinoma of skin of other parts of face: Secondary | ICD-10-CM | POA: Diagnosis not present

## 2017-11-18 ENCOUNTER — Encounter: Payer: Self-pay | Admitting: Family Medicine

## 2017-12-03 DIAGNOSIS — Z23 Encounter for immunization: Secondary | ICD-10-CM | POA: Diagnosis not present

## 2017-12-15 ENCOUNTER — Other Ambulatory Visit: Payer: Self-pay | Admitting: Family Medicine

## 2017-12-20 DIAGNOSIS — M5410 Radiculopathy, site unspecified: Secondary | ICD-10-CM | POA: Diagnosis not present

## 2017-12-22 ENCOUNTER — Other Ambulatory Visit: Payer: Self-pay | Admitting: Orthopedic Surgery

## 2017-12-22 DIAGNOSIS — M5416 Radiculopathy, lumbar region: Secondary | ICD-10-CM

## 2018-01-05 ENCOUNTER — Ambulatory Visit
Admission: RE | Admit: 2018-01-05 | Discharge: 2018-01-05 | Disposition: A | Discharge status: Home / Self Care | Payer: Medicare Other | Source: Ambulatory Visit | Attending: Orthopedic Surgery | Admitting: Orthopedic Surgery

## 2018-01-05 ENCOUNTER — Other Ambulatory Visit: Payer: Self-pay | Admitting: Orthopedic Surgery

## 2018-01-05 DIAGNOSIS — M5416 Radiculopathy, lumbar region: Secondary | ICD-10-CM

## 2018-01-05 DIAGNOSIS — M47817 Spondylosis without myelopathy or radiculopathy, lumbosacral region: Secondary | ICD-10-CM | POA: Diagnosis not present

## 2018-01-05 MED ORDER — IOPAMIDOL (ISOVUE-M 200) INJECTION 41%
1.0000 mL | Freq: Once | INTRAMUSCULAR | Status: AC
  Administered 2018-01-05: 1 mL via EPIDURAL

## 2018-01-05 MED ORDER — METHYLPREDNISOLONE ACETATE 40 MG/ML INJ SUSP (RADIOLOG
120.0000 mg | Freq: Once | INTRAMUSCULAR | Status: AC
  Administered 2018-01-05: 120 mg via EPIDURAL

## 2018-01-05 NOTE — Discharge Instructions (Signed)

## 2018-01-10 ENCOUNTER — Other Ambulatory Visit: Payer: Self-pay | Admitting: Family Medicine

## 2018-01-26 DIAGNOSIS — C44319 Basal cell carcinoma of skin of other parts of face: Secondary | ICD-10-CM | POA: Diagnosis not present

## 2018-02-15 ENCOUNTER — Other Ambulatory Visit: Payer: Self-pay | Admitting: Family Medicine

## 2018-02-25 ENCOUNTER — Other Ambulatory Visit: Payer: Self-pay

## 2018-02-28 ENCOUNTER — Other Ambulatory Visit: Payer: Self-pay | Admitting: Family Medicine

## 2018-02-28 DIAGNOSIS — E1142 Type 2 diabetes mellitus with diabetic polyneuropathy: Secondary | ICD-10-CM

## 2018-03-01 ENCOUNTER — Other Ambulatory Visit (INDEPENDENT_AMBULATORY_CARE_PROVIDER_SITE_OTHER): Payer: Medicare Other

## 2018-03-01 DIAGNOSIS — E1142 Type 2 diabetes mellitus with diabetic polyneuropathy: Secondary | ICD-10-CM | POA: Diagnosis not present

## 2018-03-01 LAB — LIPID PANEL
Cholesterol: 89 mg/dL (ref 0–200)
HDL: 28.2 mg/dL — ABNORMAL LOW (ref >39.00)
NonHDL: 60.76
Total CHOL/HDL Ratio: 3
Triglycerides: 342 mg/dL — ABNORMAL HIGH (ref 0.0–149.0)
VLDL: 68.4 mg/dL — ABNORMAL HIGH (ref 0.0–40.0)

## 2018-03-01 LAB — COMPREHENSIVE METABOLIC PANEL
ALBUMIN: 4.3 g/dL (ref 3.5–5.2)
ALT: 23 U/L (ref 0–53)
AST: 29 U/L (ref 0–37)
Alkaline Phosphatase: 74 U/L (ref 39–117)
BILIRUBIN TOTAL: 0.8 mg/dL (ref 0.2–1.2)
BUN: 18 mg/dL (ref 6–23)
CO2: 29 mEq/L (ref 19–32)
CREATININE: 1.12 mg/dL (ref 0.40–1.50)
Calcium: 9.2 mg/dL (ref 8.4–10.5)
Chloride: 102 mEq/L (ref 96–112)
GFR: 67.63 mL/min (ref >60.00)
Glucose, Bld: 137 mg/dL — ABNORMAL HIGH (ref 70–99)
Potassium: 4.3 mEq/L (ref 3.5–5.1)
SODIUM: 142 meq/L (ref 135–145)
TOTAL PROTEIN: 7.3 g/dL (ref 6.0–8.3)

## 2018-03-01 LAB — LDL CHOLESTEROL, DIRECT: Direct LDL: 27 mg/dL

## 2018-03-01 LAB — HEMOGLOBIN A1C: HEMOGLOBIN A1C: 7.8 % — AB (ref 4.6–6.5)

## 2018-03-06 ENCOUNTER — Other Ambulatory Visit: Payer: Self-pay | Admitting: Family Medicine

## 2018-03-07 ENCOUNTER — Other Ambulatory Visit: Payer: Medicare Other

## 2018-03-07 DIAGNOSIS — E1142 Type 2 diabetes mellitus with diabetic polyneuropathy: Secondary | ICD-10-CM

## 2018-03-07 LAB — MICROALBUMIN / CREATININE URINE RATIO
CREATININE, U: 145.9 mg/dL
MICROALB UR: 0.7 mg/dL (ref 0.0–1.9)
Microalb Creat Ratio: 0.5 mg/g (ref 0.0–30.0)

## 2018-03-08 ENCOUNTER — Encounter: Payer: Self-pay | Admitting: Family Medicine

## 2018-03-08 ENCOUNTER — Ambulatory Visit (INDEPENDENT_AMBULATORY_CARE_PROVIDER_SITE_OTHER): Payer: Medicare Other | Admitting: Family Medicine

## 2018-03-08 VITALS — BP 150/76 | HR 76 | Temp 97.6°F | Ht 74.0 in | Wt 222.0 lb

## 2018-03-08 DIAGNOSIS — E1142 Type 2 diabetes mellitus with diabetic polyneuropathy: Secondary | ICD-10-CM

## 2018-03-08 DIAGNOSIS — Z0000 Encounter for general adult medical examination without abnormal findings: Secondary | ICD-10-CM | POA: Diagnosis not present

## 2018-03-08 DIAGNOSIS — I10 Essential (primary) hypertension: Secondary | ICD-10-CM | POA: Diagnosis not present

## 2018-03-08 DIAGNOSIS — E785 Hyperlipidemia, unspecified: Secondary | ICD-10-CM

## 2018-03-08 DIAGNOSIS — E1129 Type 2 diabetes mellitus with other diabetic kidney complication: Secondary | ICD-10-CM

## 2018-03-08 DIAGNOSIS — R809 Proteinuria, unspecified: Secondary | ICD-10-CM

## 2018-03-08 DIAGNOSIS — Z7189 Other specified counseling: Secondary | ICD-10-CM

## 2018-03-08 MED ORDER — AMLODIPINE BESYLATE 2.5 MG PO TABS: 2.5000 mg | ORAL_TABLET | Freq: Every day | ORAL | 3 refills | Status: DC

## 2018-03-08 MED ORDER — INSULIN DETEMIR 100 UNIT/ML FLEXPEN: PEN_INJECTOR | SUBCUTANEOUS | 5 refills | Status: DC

## 2018-03-08 MED ORDER — METFORMIN HCL 1000 MG PO TABS: 1000.0000 mg | ORAL_TABLET | Freq: Two times a day (BID) | ORAL | 3 refills | Status: DC

## 2018-03-08 MED ORDER — GABAPENTIN 300 MG PO CAPS: ORAL_CAPSULE | ORAL | 3 refills | Status: DC

## 2018-03-08 NOTE — Patient Instructions (Addendum)
Please get me a copy of your colonoscopy report/letter.  We'll see about getting you an appointment if needed.    Recheck 6 months with labs at the visit, sooner if needed.  The only lab you need to have done for your next diabetic visit is an A1c.  We can do this with a fingerstick test at the office visit.  You do not need a lab visit ahead of time for this.  It does not matter if you are fasting when the lab is done.    Take care.  Glad to see you.

## 2018-03-08 NOTE — Progress Notes (Signed)
I have personally reviewed the Medicare Annual Wellness questionnaire and have noted 1. The patient's medical and social history 2. Their use of alcohol, tobacco or illicit drugs 3. Their current medications and supplements 4. The patient's functional ability including ADL's, fall risks, home safety risks and hearing or visual             impairment. 5. Diet and physical activities 6. Evidence for depression or mood disorders  The patients weight, height, BMI have been recorded in the chart and visual acuity is per eye clinic.  I have made referrals, counseling and provided education to the patient based review of the above and I have provided the pt with a written personalized care plan for preventive services.  Provider list updated- see scanned forms.  Routine anticipatory guidance given to patient.  See health maintenance. The possibility exists that previously documented standard health maintenance information may have been brought forward from a previous encounter into this note.  If needed, that same information has been updated to reflect the current situation based on today's encounter.    Flu 2019 Shingles out of stock PNA up-to-date Tetanus 2016 Colonoscopy d/w pt.  He may be due for f/u.  See avs.  Prostate cancer screening declined.  Discussed with patient. Advance directive-wife and oldest daughter Ebony Hail equally designated if patient were incapacitated. Cognitive function addressed- see scanned forms- and if abnormal then additional documentation follows.   He is helping care for his wife and that is an ongoing issue, d/w pt. support offered.  He will update me as needed.  She is making some progress.  Diabetes:  Using medications without difficulties:yes Hypoglycemic episodes: very rare, d/w pt about cautions.   Hyperglycemic episodes:no Feet problems: pain controlled with gabapentin.   Blood Sugars averaging: usually variable in the AMs 100-200, depending on diet/PM  food.   eye exam within last year: yes Labs d/w pt.  Reasonable A1c goal 7-8.  D/w pt.   Hypertension:    Using medication without problems or lightheadedness: yes Chest pain with exertion:no Edema:no Short of breath:no  Elevated Cholesterol: Using medications without problems:yes Muscle aches: not from statin Diet compliance: encouraged.   Exercise: encouraged  He has had some nocturnal L shoulder pain after heavy lifting.  Not exertional.  Likely MSK and got better with icy hot.    PMH and SH reviewed  Meds, vitals, and allergies reviewed.   ROS: Per HPI.  Unless specifically indicated otherwise in HPI, the patient denies:  General: fever. Eyes: acute vision changes ENT: sore throat Cardiovascular: chest pain Respiratory: SOB GI: vomiting GU: dysuria Musculoskeletal: acute back pain Derm: acute rash Neuro: acute motor dysfunction Psych: worsening mood Endocrine: polydipsia Heme: bleeding Allergy: hayfever  GEN: nad, alert and oriented HEENT: mucous membranes moist NECK: supple w/o LA CV: rrr. PULM: ctab, no inc wob ABD: soft, +bs EXT: trace  BLE edema SKIN: no acute rash but some dry skin on the R knee, laterally  Diabetic foot exam: Normal inspection No skin breakdown No calluses  Normal DP pulses Normal sensation to light touch and but dec to monofilament Nails normal on R foot but thickened on L foot.

## 2018-03-12 DIAGNOSIS — Z7189 Other specified counseling: Secondary | ICD-10-CM | POA: Insufficient documentation

## 2018-03-12 DIAGNOSIS — Z0000 Encounter for general adult medical examination without abnormal findings: Secondary | ICD-10-CM | POA: Insufficient documentation

## 2018-03-12 NOTE — Assessment & Plan Note (Signed)
History of microalbuminuria.  Negative now.  Discussed with patient.  Reasonable A1c goal 7-8.  D/w pt. continue as is.  Recheck periodically.  He agrees.

## 2018-03-12 NOTE — Assessment & Plan Note (Signed)
LDL is at goal.  Continue work on diet to help with diabetes and also triglycerides.  Labs discussed with patient.  He agrees.

## 2018-03-12 NOTE — Assessment & Plan Note (Signed)
Flu 2019 Shingles out of stock PNA up-to-date Tetanus 2016 Colonoscopy d/w pt.  He may be due for f/u.  See avs.  Prostate cancer screening declined.  Discussed with patient. Advance directive-wife and oldest daughter Ebony Hail equally designated if patient were incapacitated. Cognitive function addressed- see scanned forms- and if abnormal then additional documentation follows.

## 2018-03-12 NOTE — Assessment & Plan Note (Signed)
Advance directive-wife and oldest daughter Allison equally designated if patient were incapacitated. ?

## 2018-03-12 NOTE — Assessment & Plan Note (Signed)
We can follow this periodically.  I do not want to induce hypotension.  Labs discussed with patient.  He agrees.

## 2018-03-21 ENCOUNTER — Other Ambulatory Visit: Payer: Self-pay | Admitting: Orthopedic Surgery

## 2018-03-21 DIAGNOSIS — M48061 Spinal stenosis, lumbar region without neurogenic claudication: Secondary | ICD-10-CM

## 2018-03-29 ENCOUNTER — Ambulatory Visit
Admission: RE | Admit: 2018-03-29 | Discharge: 2018-03-29 | Disposition: A | Discharge status: Home / Self Care | Payer: Medicare Other | Source: Ambulatory Visit | Attending: Orthopedic Surgery | Admitting: Orthopedic Surgery

## 2018-03-29 DIAGNOSIS — M48061 Spinal stenosis, lumbar region without neurogenic claudication: Secondary | ICD-10-CM

## 2018-03-29 DIAGNOSIS — M545 Low back pain: Secondary | ICD-10-CM | POA: Diagnosis not present

## 2018-03-29 MED ORDER — IOPAMIDOL (ISOVUE-M 200) INJECTION 41%
1.0000 mL | Freq: Once | INTRAMUSCULAR | Status: AC
  Administered 2018-03-29: 1 mL via EPIDURAL

## 2018-03-29 MED ORDER — METHYLPREDNISOLONE ACETATE 40 MG/ML INJ SUSP (RADIOLOG
120.0000 mg | Freq: Once | INTRAMUSCULAR | Status: AC
  Administered 2018-03-29: 120 mg via EPIDURAL

## 2018-04-21 IMAGING — CT CT CHEST W/ CM
3 of 6 series · 13 of 36 positions shown, 15 images · IV contrast (APPLIED)
Comparison: 12/18/2015 CT abdomen and pelvis

CLINICAL DATA: Level 1 trauma, bleeding at the meatus of the penis.

EXAM:
CT CHEST WITH CONTRAST
CT ABDOMEN AND PELVIS WITH AND WITHOUT CONTRAST
TECHNIQUE: Multidetector CT imaging of the chest was performed during
intravenous contrast administration. Multidetector CT imaging of the
abdomen and pelvis was performed following the standard protocol
before and during bolus administration of intravenous contrast.
CONTRAST:  100mL G7V8PU-UTT IOPAMIDOL (G7V8PU-UTT) INJECTION 61%

[Series 2: cap 5.0 i31f 1 · axial · 0.90mm/px · z∈[-687,-152]mm · 6 of 151 slices shown, 8 images]
[im 22/151  mediastinal]
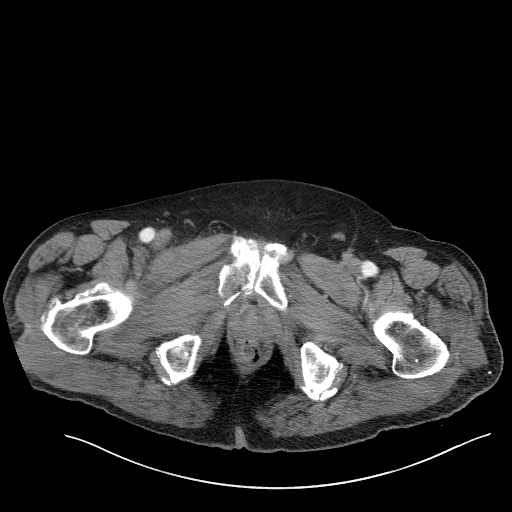
[im 22/151  lung]
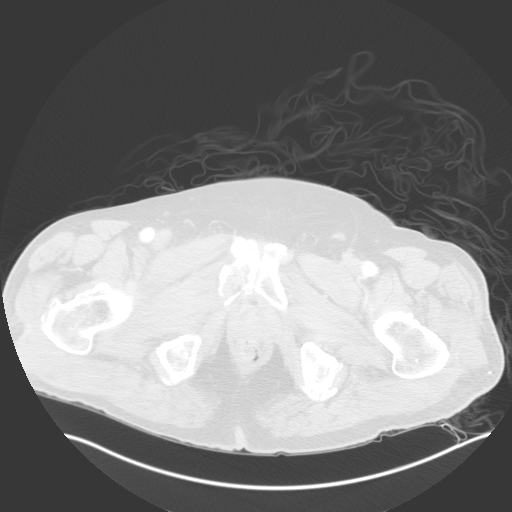
[im 43/151  lung]
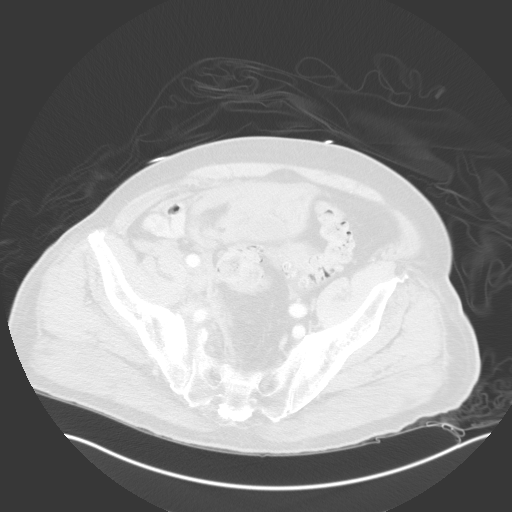
[im 65/151  lung]
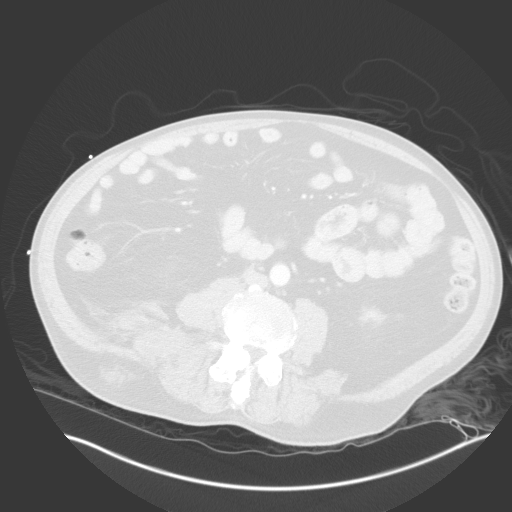
[im 86/151  lung]
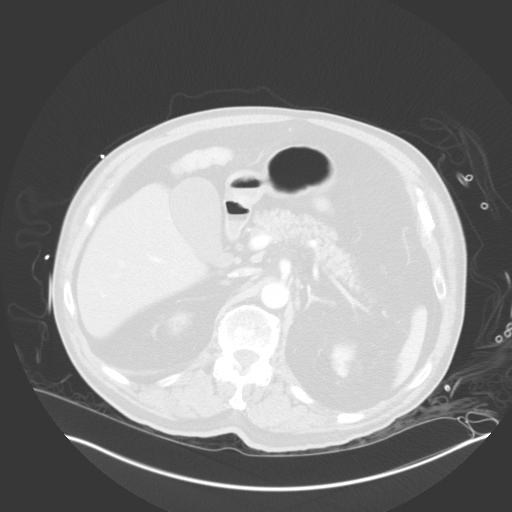
[im 108/151  mediastinal]
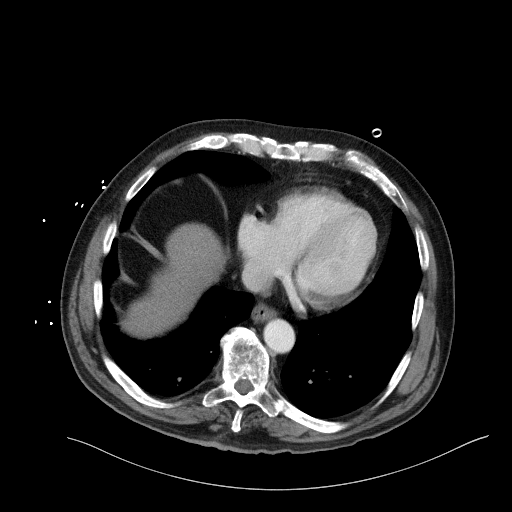
[im 108/151  lung]
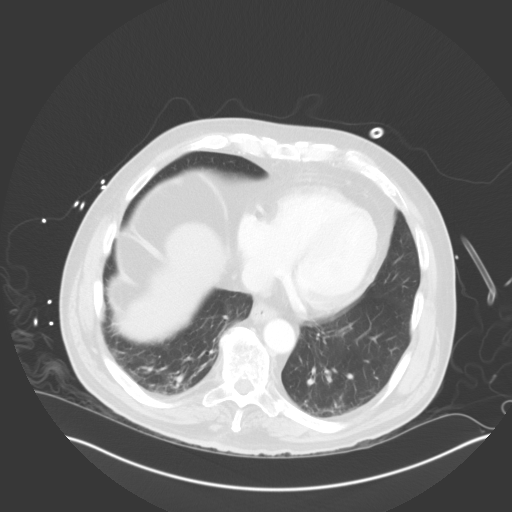
[im 129/151  lung]
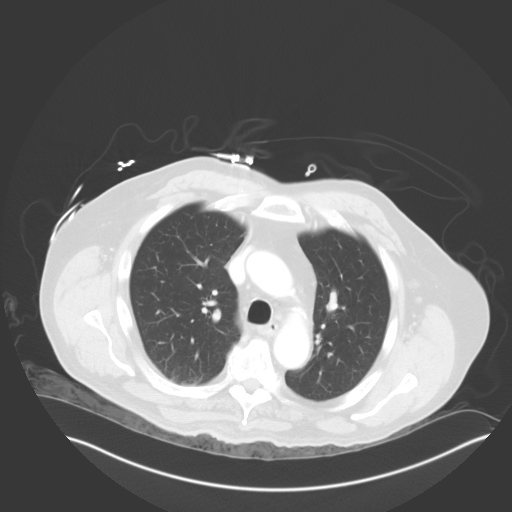

[Series 5: coronal · coronal · 0.90mm/px · 3 of 145 slices shown]
[im 29/145  lung]
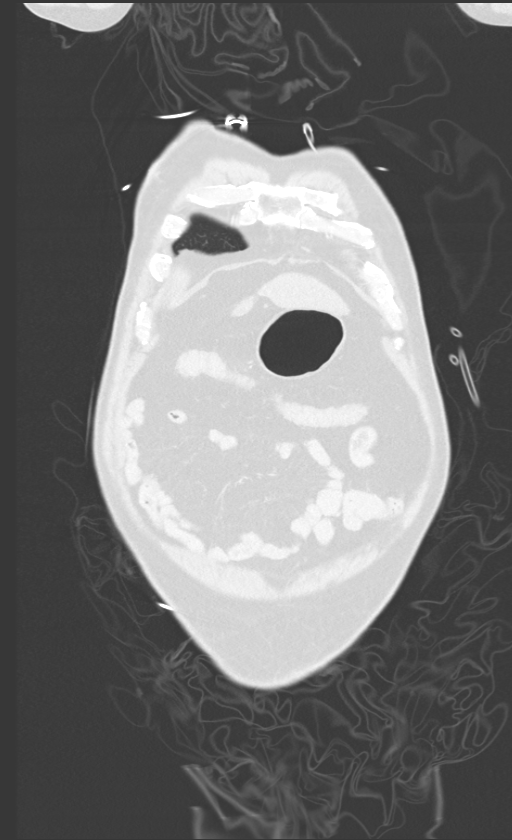
[im 58/145  lung]
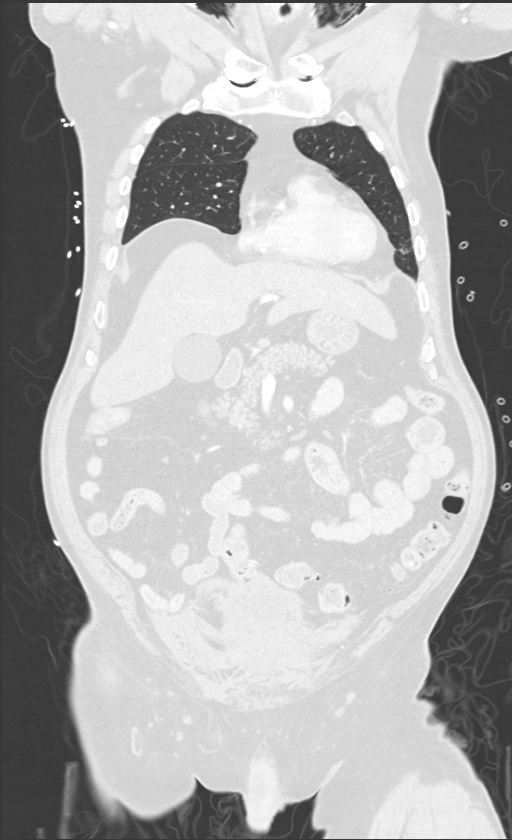
[im 87/145  lung]
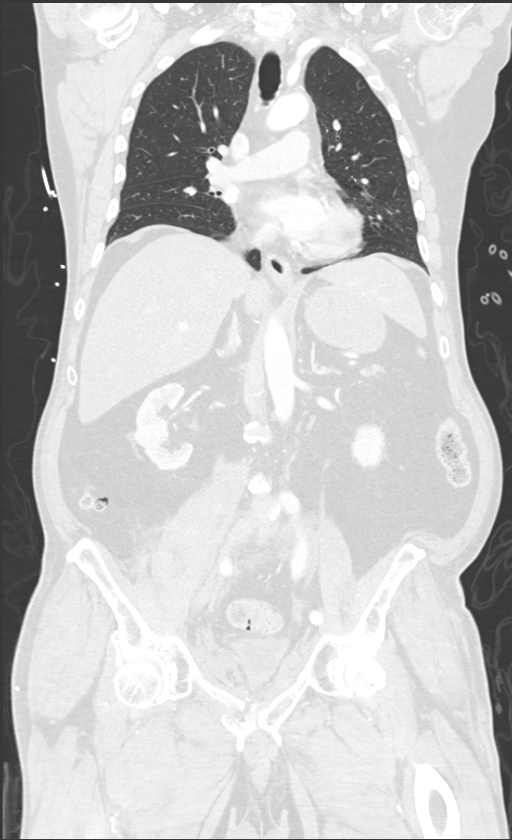

[Series 7: delay 5.0 i31f 1 · axial · delayed · 0.92mm/px · z∈[-774,-449]mm · 4 of 109 slices shown]
[im 22/109  lung]
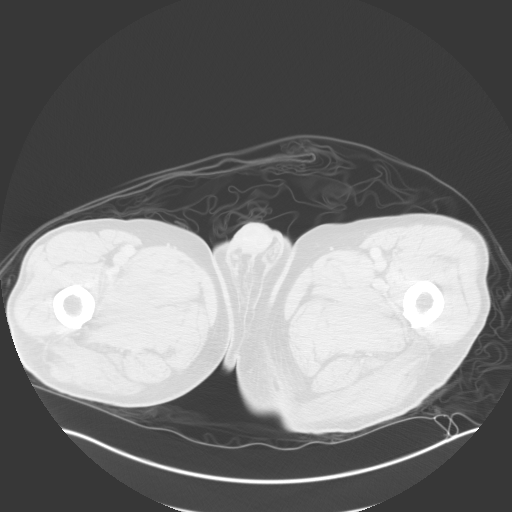
[im 44/109  lung]
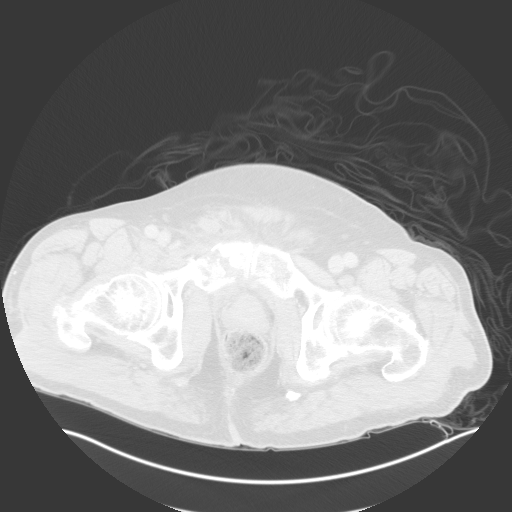
[im 65/109  lung]
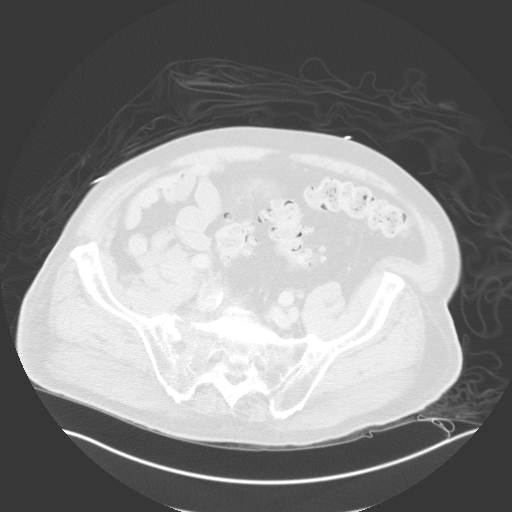
[im 87/109  lung]
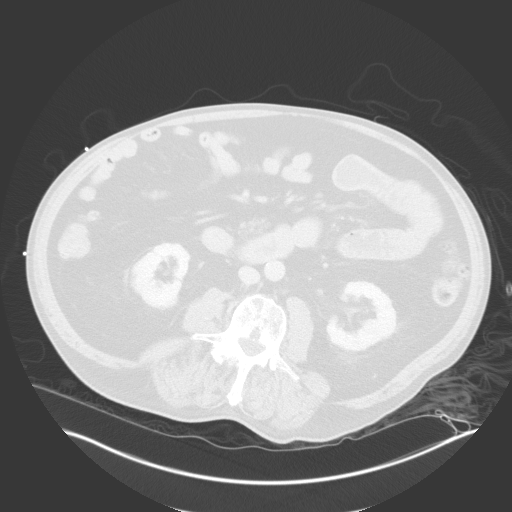

[13 of 36 positions shown; findings below may reference images not displayed]

FINDINGS: CT CHEST FINDINGS

Cardiovascular: Normal size cardiac chambers. No pericardial
effusion. Minimal coronary arteriosclerosis.

Mediastinum/Nodes: No mediastinal hematoma. Normal branching pattern
of the great vessels. Slightly ectatic ascending thoracic aorta
measuring up to 3.6 cm at the level of the main pulmonary artery. No
dissection of the thoracic aorta.

Lungs/Pleura: No effusion or pneumothorax. Bibasilar atelectasis.
Pulmonary contusion nor consolidation.

Musculoskeletal: Old right eighth rib fracture with subtle
deformity. No acute displaced fracture of the bony thorax.
Osteoarthritis of the sternoclavicular joints.

CT ABDOMEN AND PELVIS FINDINGS

Hepatobiliary: Slightly hypodense appearance of the liver which may
reflect fatty infiltration. No space-occupying mass. The left lobe
is seen draped over the normal size spleen.

Pancreas: Unremarkable. No pancreatic ductal dilatation or
surrounding inflammatory changes.

Spleen: No splenic injury or perisplenic hematoma. Small splenule
measuring 7 mm

Adrenals/Urinary Tract: Bilateral renal cysts are identified the
largest is interpolar on the right measuring 1.6 cm and on the left,
involving the lower pole measuring 1.9 cm. Bilateral punctate
calcifications are identified the largest is on the left measuring 2
mm. The bladder is contracted in appearance. Overlying the bladder
anteriorly in the space of Retzius and extending cephalad is
heterogeneous isodense to hyperdense fluid in keeping with
hemorrhage and clot. This measures approximately 12.9 cm
craniocaudad by 3 cm AP x 12.4 cm transverse. Delayed images were
repeated through the bladder demonstrating contrast opacified urine
along an intact posterior bladder wall. The anterior wall where the
adjacent hemorrhage however is seen cannot be definitely cleared for
tear/rupture.

Stomach/Bowel: Stomach is within normal limits. Appendix appears
normal. No evidence of bowel wall thickening, distention, or
inflammatory changes.

Vascular/Lymphatic: Aortic atherosclerosis. No enlarged abdominal or
pelvic lymph nodes.

Reproductive: Prostate is unremarkable.

Other: Retroperitoneal hematoma with a asymmetric enlargement of the
right psoas muscle suggesting an intramuscular component to
hematoma. Overlying ill-defined free fluid is noted abutting the
right psoas muscle. Soft tissue induration of the right gluteal soft
tissues likely representing contusion.

Musculoskeletal: There are acute fractures of the bony pelvis which
are as follows:

1. Sagittal right sacral alar fracture extending across the right
sacroiliac joint and involving the right iliac bone. This appears to
spare the sacral neural foramina.

2. Bilateral parasymphyseal fractures extending into the pubic
symphysis posteriorly.

3.  Right inferior pubic ramus fracture with minimal displacement.

There is lower lumbar degenerative disc disease at L4-5 and L5-S1.
Multilevel facet hypertrophy and sclerosis most prominently at L5-S1
causing moderate to marked neural foraminal stenosis. No acute
osseous abnormality of the dorsal spine.
IMPRESSION: 1. Acute fractures of the right sacral ala extending across the
right sacroiliac joint and involving the posterior right iliac bone.
No significant displacement.
2. Acute bilateral parasymphyseal fractures with fracture lucencies
extending into the pubic symphysis.
3. Acute minimally displaced right inferior pubic ramus fracture.
4. Hemorrhage and clot interposed between the pubic symphysis and
bladder extending cephalad in the space of Retzius, measuring
approximately 12.9 x 3 x 12.4 cm.

No acute chest abnormalities.

These results were called by telephone at the time of interpretation
on 01/10/2016 at [DATE] to Dr. Klever , who verbally acknowledged
these results.

## 2018-04-27 IMAGING — RF DG C-ARM 61-120 MIN
1 series · 11 of 11 positions shown · non-contrast
Comparison: Pelvis series of January 13, 2016

CLINICAL DATA: Inter operative pinning of the SI joints. Fluoro
time reported is 2 minutes 36 seconds. Eleven fluoro spot images

EXAM:
BILATERAL SACROILIAC JOINTS - 3+ VIEW; DG C-ARM 61-120 MIN

[Series 1: run · 11 of 11 slices shown]
[im 1/11]
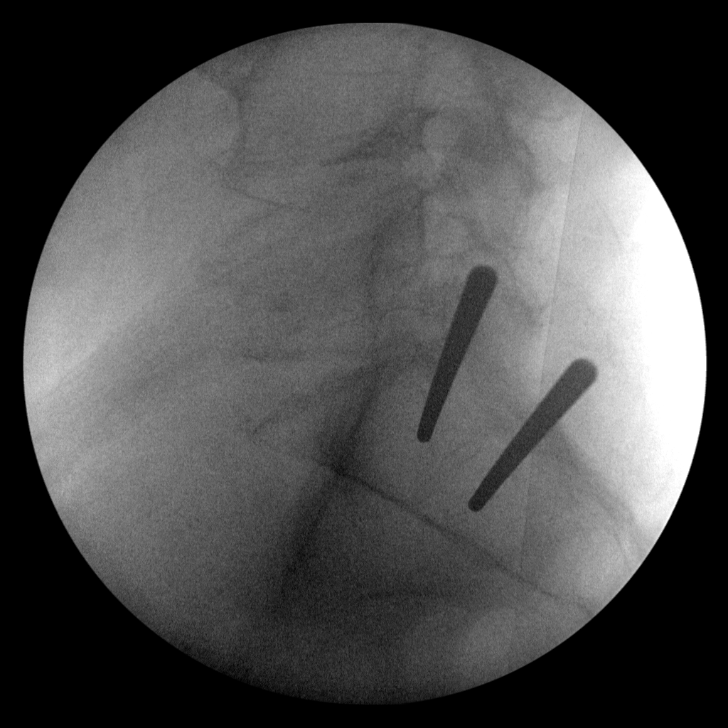
[im 2/11]
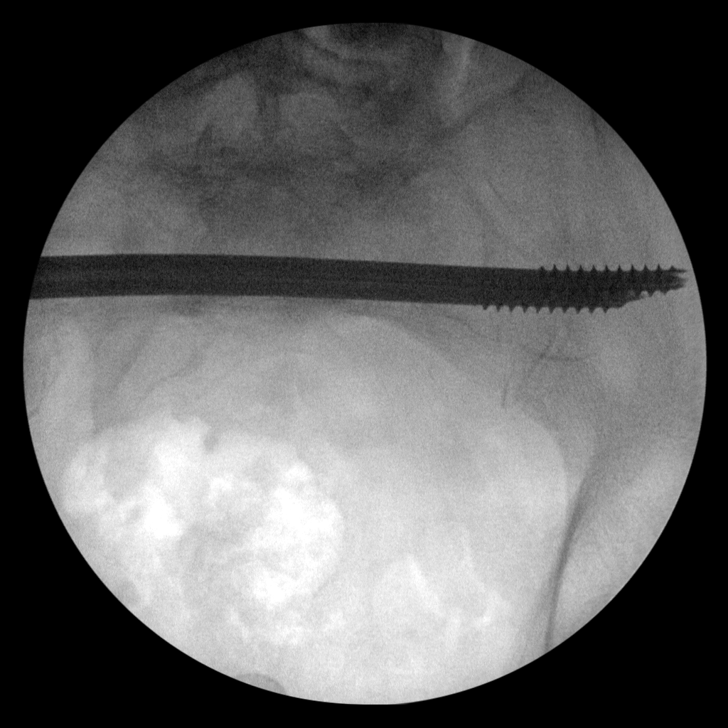
[im 3/11]
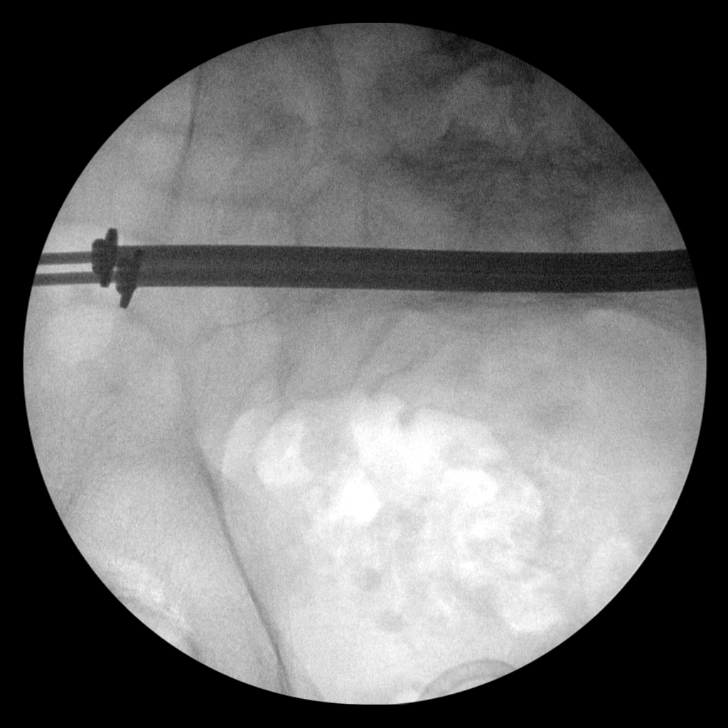
[im 4/11]
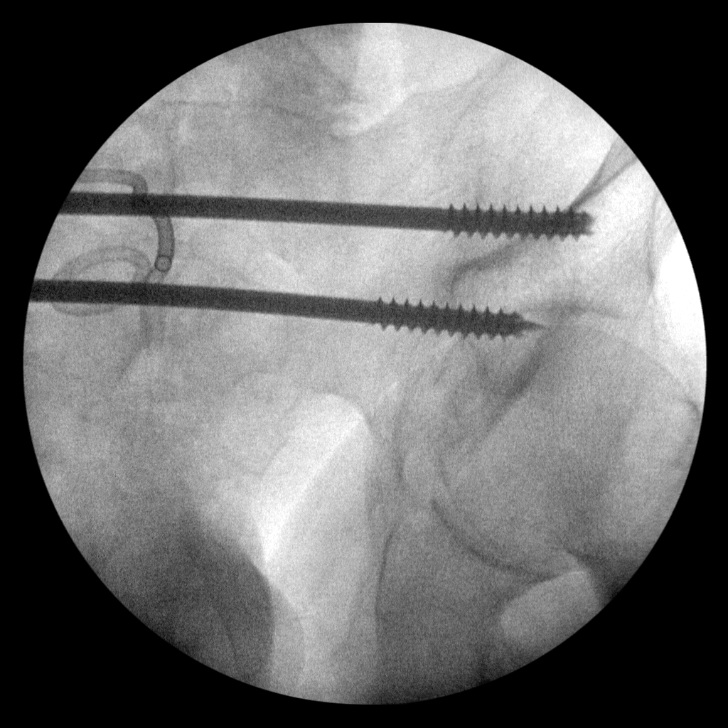
[im 5/11]
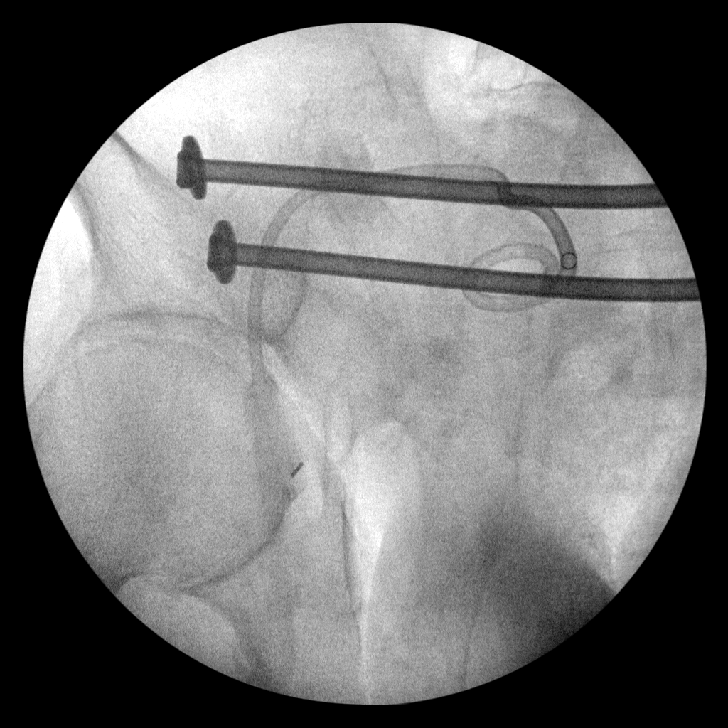
[im 6/11]
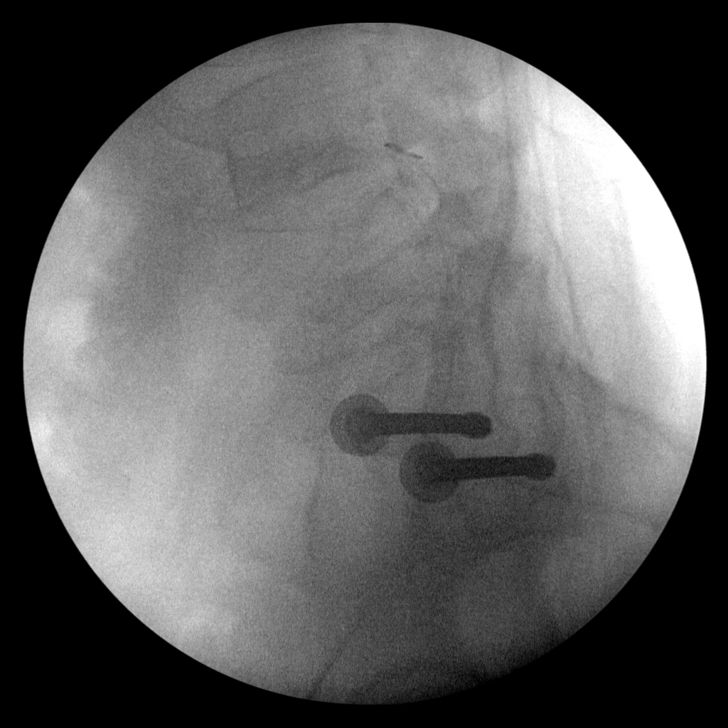
[im 7/11]
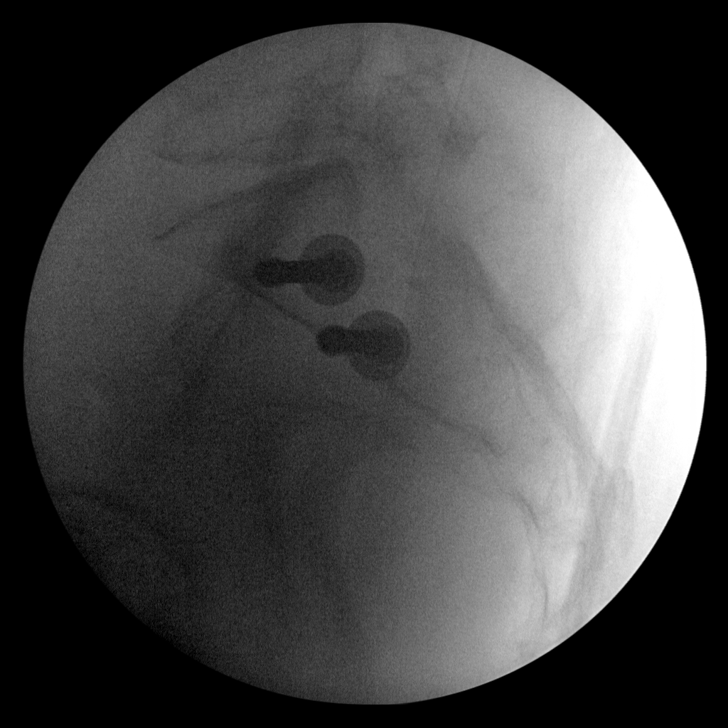
[im 8/11]
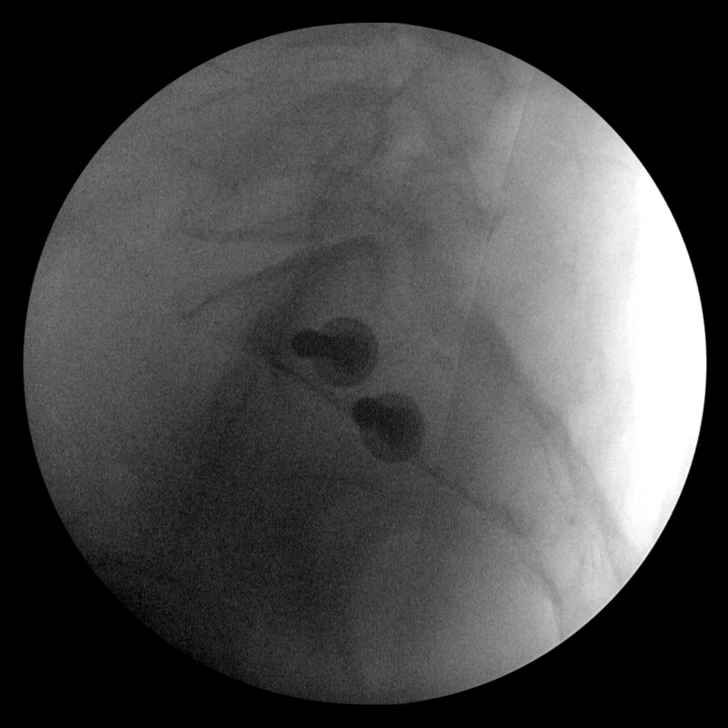
[im 9/11]
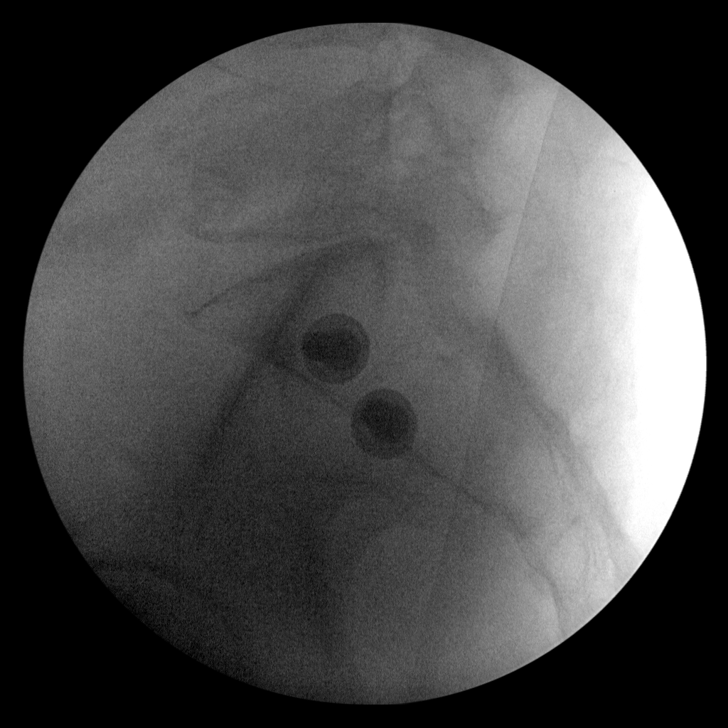
[im 10/11]
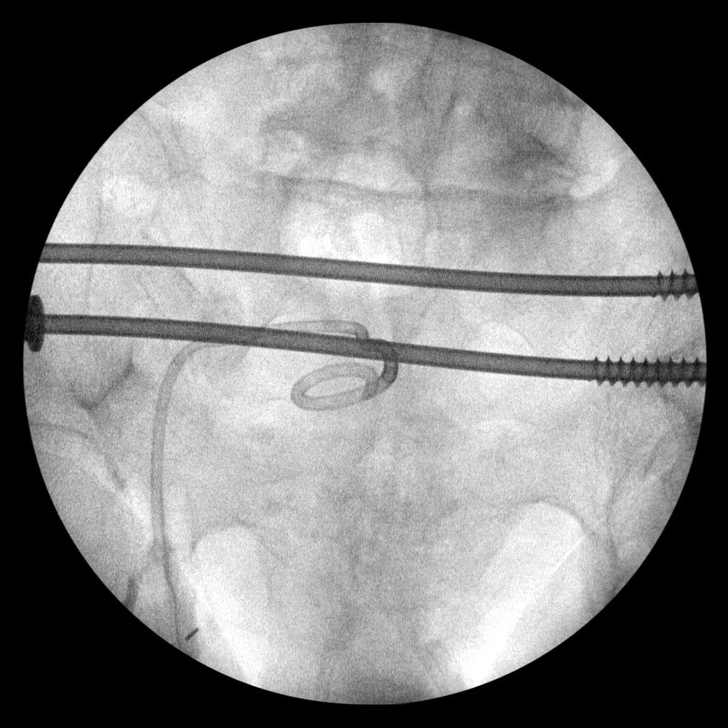
[im 11/11]
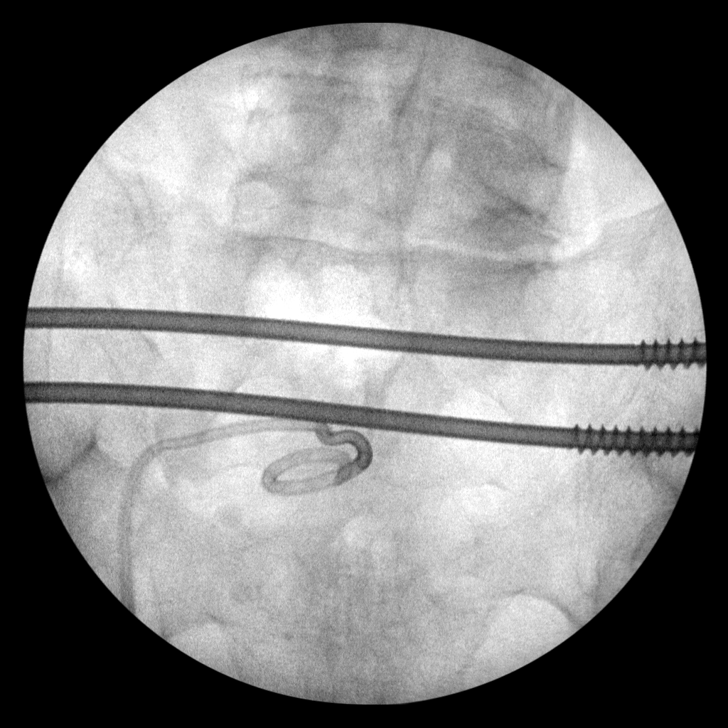

[11 of 11 positions shown; findings below may reference images not displayed]

FINDINGS: The patient has undergone placement of fixation screws across the
sacrum from the right medial iliac bone to the left medial iliac
bone. There is no immediate postprocedure complication. There is a
drainage catheter presumably in the pelvic cavity.
IMPRESSION: The patient has undergone transsacral pinning of the SI joints
without evidence of immediate postprocedure complication.

## 2018-05-19 DIAGNOSIS — L821 Other seborrheic keratosis: Secondary | ICD-10-CM | POA: Diagnosis not present

## 2018-05-19 DIAGNOSIS — L814 Other melanin hyperpigmentation: Secondary | ICD-10-CM | POA: Diagnosis not present

## 2018-05-19 DIAGNOSIS — D1801 Hemangioma of skin and subcutaneous tissue: Secondary | ICD-10-CM | POA: Diagnosis not present

## 2018-05-19 DIAGNOSIS — D225 Melanocytic nevi of trunk: Secondary | ICD-10-CM | POA: Diagnosis not present

## 2018-05-19 DIAGNOSIS — L570 Actinic keratosis: Secondary | ICD-10-CM | POA: Diagnosis not present

## 2018-05-19 DIAGNOSIS — Z85828 Personal history of other malignant neoplasm of skin: Secondary | ICD-10-CM | POA: Diagnosis not present

## 2018-05-23 IMAGING — XA IR CATHETER TUBE CHANGE
2 series · 2 of 2 positions shown · non-contrast
Comparison: 01/10/2016

INDICATION: Pain at exit site of indwelling 12 French suprapubic bladder
drainage catheter originally placed on 01/10/2016.

EXAM:
IR CATHETER TUBE CHANGE

[Series 1: fl (-) angio · 1 of 1 slices shown (1 of 2)]
[im 1/1]
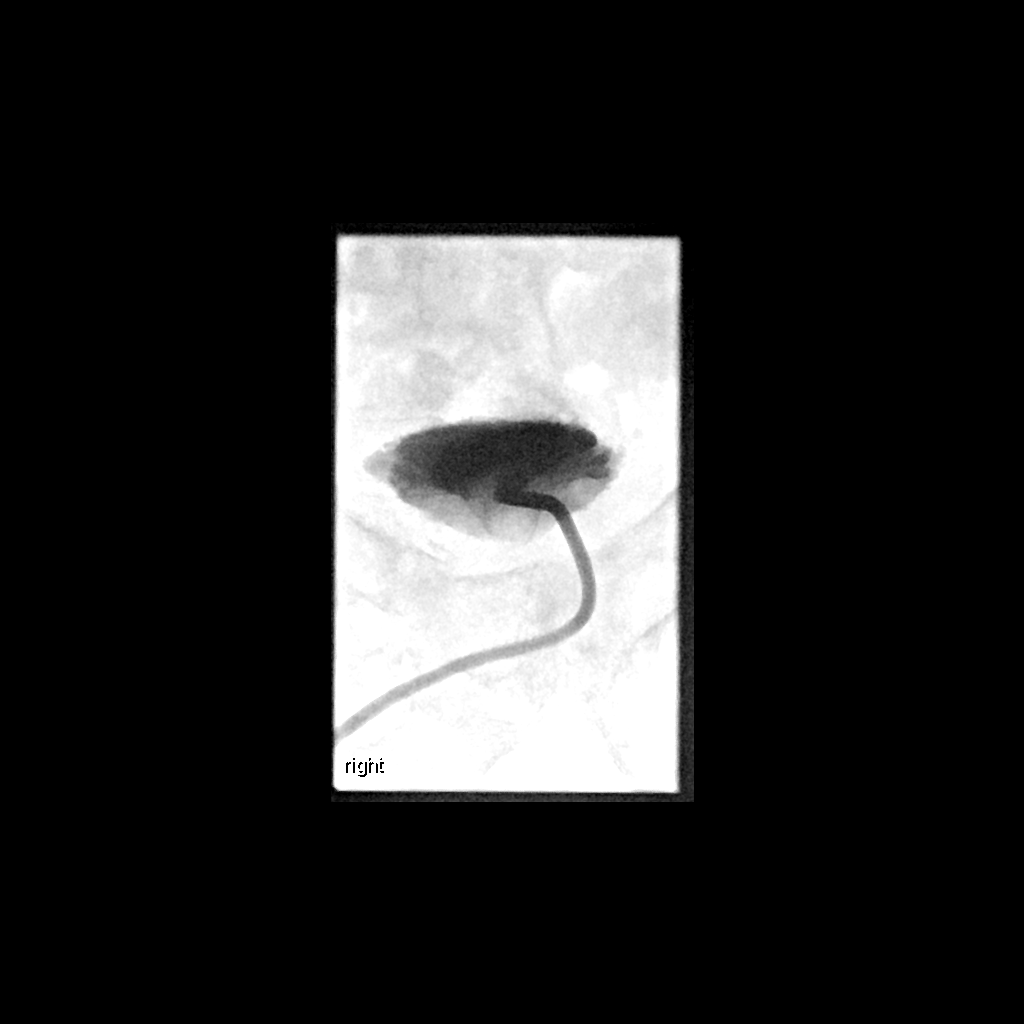

[Series 2: fl (-) angio · 1 of 1 slices shown (2 of 2)]
[im 1/1]
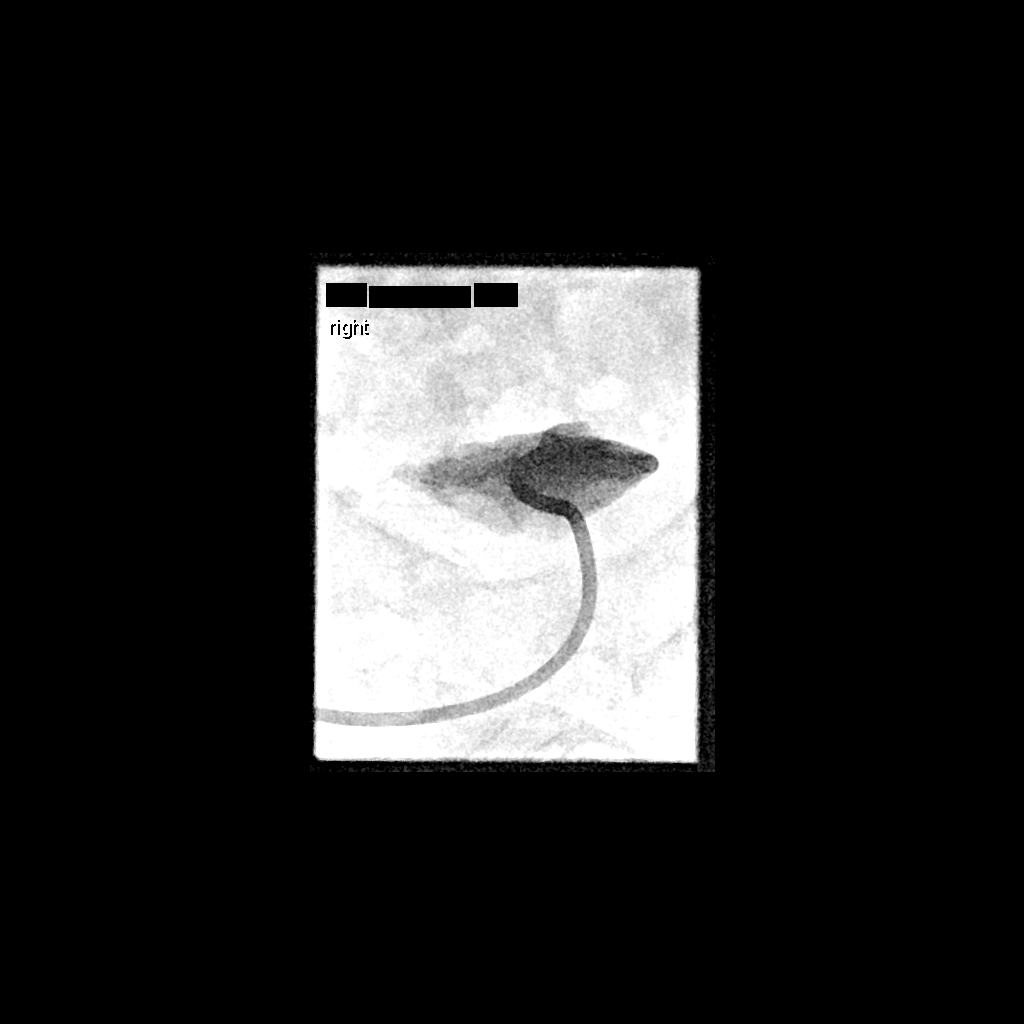

[2 of 2 positions shown; findings below may reference images not displayed]

MEDICATIONS:
None

ANESTHESIA/SEDATION:
None

CONTRAST:  10 mL Osovue-FMM - administered into the collecting
system(s)

FLUOROSCOPY TIME:  Fluoroscopy Time: 12 seconds (2 mGy).

COMPLICATIONS:
None immediate.

PROCEDURE:
Informed written consent was obtained from the patient after a
thorough discussion of the procedural risks, benefits and
alternatives. All questions were addressed. Maximal Sterile Barrier
Technique was utilized including caps, mask, sterile gowns, sterile
gloves, sterile drape, hand hygiene and skin antiseptic. A timeout
was performed prior to the initiation of the procedure.

The pre-existing 12 French suprapubic tube was prepped and draped.
Diluted contrast was injected into the bladder lumen. The tube was
cut and removed over a guidewire. A new 12 French drainage catheter
was advanced over the wire and formed in the bladder lumen. Catheter
positioning was confirmed by a fluoroscopic spot image. The catheter
was secured with a Prolene retention suture and connected to a
gravity drainage bag.
FINDINGS: The pre-existing catheter is normally positioned in the bladder
lumen. Catheter exchange was performed with the new catheter
extending into the bladder lumen. There is return of urine from the
suprapubic catheter after replacement.
IMPRESSION: Exchange of indwelling 12 French suprapubic bladder drainage
catheter.

## 2018-06-21 ENCOUNTER — Other Ambulatory Visit: Payer: Self-pay | Admitting: Orthopedic Surgery

## 2018-06-21 DIAGNOSIS — M48061 Spinal stenosis, lumbar region without neurogenic claudication: Secondary | ICD-10-CM

## 2018-06-28 ENCOUNTER — Ambulatory Visit
Admission: RE | Admit: 2018-06-28 | Discharge: 2018-06-28 | Disposition: A | Discharge status: Home / Self Care | Payer: Medicare Other | Source: Ambulatory Visit | Attending: Orthopedic Surgery | Admitting: Orthopedic Surgery

## 2018-06-28 ENCOUNTER — Other Ambulatory Visit: Payer: Self-pay

## 2018-06-28 DIAGNOSIS — M48061 Spinal stenosis, lumbar region without neurogenic claudication: Secondary | ICD-10-CM

## 2018-06-28 DIAGNOSIS — M47817 Spondylosis without myelopathy or radiculopathy, lumbosacral region: Secondary | ICD-10-CM | POA: Diagnosis not present

## 2018-06-28 MED ORDER — METHYLPREDNISOLONE ACETATE 40 MG/ML INJ SUSP (RADIOLOG
120.0000 mg | Freq: Once | INTRAMUSCULAR | Status: AC
  Administered 2018-06-28: 120 mg via EPIDURAL

## 2018-06-28 MED ORDER — IOPAMIDOL (ISOVUE-M 200) INJECTION 41%
1.0000 mL | Freq: Once | INTRAMUSCULAR | Status: AC
  Administered 2018-06-28: 1 mL via EPIDURAL

## 2018-06-28 NOTE — Discharge Instructions (Signed)

## 2018-08-18 DIAGNOSIS — Z87442 Personal history of urinary calculi: Secondary | ICD-10-CM | POA: Diagnosis not present

## 2018-09-08 ENCOUNTER — Other Ambulatory Visit: Payer: Self-pay | Admitting: Family Medicine

## 2018-09-16 ENCOUNTER — Other Ambulatory Visit: Payer: Self-pay | Admitting: Orthopedic Surgery

## 2018-09-16 DIAGNOSIS — M48061 Spinal stenosis, lumbar region without neurogenic claudication: Secondary | ICD-10-CM

## 2018-09-22 ENCOUNTER — Other Ambulatory Visit: Payer: Self-pay | Admitting: *Deleted

## 2018-09-22 ENCOUNTER — Other Ambulatory Visit: Payer: Self-pay | Admitting: Family Medicine

## 2018-09-22 MED ORDER — GLIPIZIDE 5 MG PO TABS: ORAL_TABLET | ORAL | 1 refills | Status: DC

## 2018-09-22 NOTE — Telephone Encounter (Signed)
Rx was requested for 30 tabs which is only a 10 day supply.  Pharmacy asks if the quantity can be changed to 30 day Rx.  Request granted for 90 tabs with 1 refill.

## 2018-09-27 ENCOUNTER — Other Ambulatory Visit: Payer: Self-pay

## 2018-09-27 ENCOUNTER — Ambulatory Visit
Admission: RE | Admit: 2018-09-27 | Discharge: 2018-09-27 | Disposition: A | Discharge status: Home / Self Care | Payer: Medicare Other | Source: Ambulatory Visit | Attending: Orthopedic Surgery | Admitting: Orthopedic Surgery

## 2018-09-27 DIAGNOSIS — M47816 Spondylosis without myelopathy or radiculopathy, lumbar region: Secondary | ICD-10-CM | POA: Diagnosis not present

## 2018-09-27 DIAGNOSIS — M48061 Spinal stenosis, lumbar region without neurogenic claudication: Secondary | ICD-10-CM

## 2018-09-27 MED ORDER — METHYLPREDNISOLONE ACETATE 40 MG/ML INJ SUSP (RADIOLOG
120.0000 mg | Freq: Once | INTRAMUSCULAR | Status: AC
  Administered 2018-09-27: 12:00:00 120 mg via EPIDURAL

## 2018-09-27 MED ORDER — IOPAMIDOL (ISOVUE-M 200) INJECTION 41%
1.0000 mL | Freq: Once | INTRAMUSCULAR | Status: AC
  Administered 2018-09-27: 12:00:00 1 mL via EPIDURAL

## 2018-09-27 NOTE — Discharge Instructions (Signed)

## 2018-10-04 ENCOUNTER — Other Ambulatory Visit: Payer: Self-pay | Admitting: *Deleted

## 2018-10-04 MED ORDER — ATORVASTATIN CALCIUM 80 MG PO TABS: 80.0000 mg | ORAL_TABLET | Freq: Every day | ORAL | 1 refills | Status: DC

## 2018-10-10 ENCOUNTER — Other Ambulatory Visit: Payer: Self-pay | Admitting: Family Medicine

## 2018-10-19 DIAGNOSIS — L821 Other seborrheic keratosis: Secondary | ICD-10-CM | POA: Diagnosis not present

## 2018-10-19 DIAGNOSIS — L814 Other melanin hyperpigmentation: Secondary | ICD-10-CM | POA: Diagnosis not present

## 2018-10-19 DIAGNOSIS — L570 Actinic keratosis: Secondary | ICD-10-CM | POA: Diagnosis not present

## 2018-10-21 ENCOUNTER — Telehealth: Payer: Self-pay

## 2018-10-21 NOTE — Telephone Encounter (Signed)
Cc pcp as fyi.  

## 2018-10-21 NOTE — Telephone Encounter (Signed)
Pt said on and off for the last month or so pts FBS goes into the 300's. No change in diet or exercise;no symptoms of elevated BS. Pt has S/T in early mornings but after being up and eating or drinking something S/T goes away; pt sleeps under a fan. No other covid symptoms. No travel and no known exposure to + covid. Pt taking glipizide 5 mg taking 2 tabs in morning and 1 tab at night; metformin 1000 mg taking 1 tab bid and levemir flexpen taking 18 units at hs.  On 09/28/18 FBS 326        10/08/18 FBS 288        10/11/18 FBS 332        10/12/18 FBS 321        10/21/18 FBS 310 Please review and let pt know what should do. Pt will wait for cb. If pt needs appt only wants to see Dr [name]. Pt will cb if symptoms change or worsen prior to cb.[facility] [place].

## 2018-10-21 NOTE — Telephone Encounter (Signed)
Spoke with pt relaying Dr. Synthia Innocent instructions.  Pt verbalizes understanding.  Says he will have to call back to schedule f/u with Dr. [name].

## 2018-10-21 NOTE — Telephone Encounter (Signed)
Increase water intake, decrease sugars and carbohydrates in the diet.  Let's increase levemir to 20 units nightly.  Schedule f/u appt with PCP next week.  In interim, I want him to check sugars twice daily and write down readings for PCP to review - fasting in am and then either before a meal or 2 hours after a meal throughout the day.  Bring sugar log to his appointment with PCP next week.

## 2018-10-23 NOTE — Telephone Encounter (Signed)
Noted.  Thanks.  Agree with all of that.  Please schedule when possible.

## 2018-10-24 NOTE — Telephone Encounter (Signed)
Appointment scheduled.

## 2018-10-25 ENCOUNTER — Encounter: Payer: Self-pay | Admitting: Family Medicine

## 2018-10-25 ENCOUNTER — Other Ambulatory Visit: Payer: Self-pay

## 2018-10-25 ENCOUNTER — Ambulatory Visit (INDEPENDENT_AMBULATORY_CARE_PROVIDER_SITE_OTHER): Payer: Medicare Other | Admitting: Family Medicine

## 2018-10-25 DIAGNOSIS — E1129 Type 2 diabetes mellitus with other diabetic kidney complication: Secondary | ICD-10-CM | POA: Diagnosis not present

## 2018-10-25 DIAGNOSIS — E1142 Type 2 diabetes mellitus with diabetic polyneuropathy: Secondary | ICD-10-CM

## 2018-10-25 DIAGNOSIS — R809 Proteinuria, unspecified: Secondary | ICD-10-CM | POA: Diagnosis not present

## 2018-10-25 LAB — BASIC METABOLIC PANEL
BUN: 16 mg/dL (ref 6–23)
CO2: 28 mEq/L (ref 19–32)
Calcium: 9.1 mg/dL (ref 8.4–10.5)
Chloride: 101 mEq/L (ref 96–112)
Creatinine, Ser: 1.19 mg/dL (ref 0.40–1.50)
GFR: 59.23 mL/min — ABNORMAL LOW (ref >60.00)
Glucose, Bld: 368 mg/dL — ABNORMAL HIGH (ref 70–99)
Potassium: 4.5 mEq/L (ref 3.5–5.1)
Sodium: 137 mEq/L (ref 135–145)

## 2018-10-25 LAB — HEMOGLOBIN A1C: Hgb A1c MFr Bld: 10.5 % — ABNORMAL HIGH (ref 4.6–6.5)

## 2018-10-25 MED ORDER — PEN NEEDLES 31G X 8 MM MISC: 3 refills | Status: DC

## 2018-10-25 MED ORDER — LEVEMIR FLEXTOUCH 100 UNIT/ML ~~LOC~~ SOPN: PEN_INJECTOR | SUBCUTANEOUS | 5 refills | Status: DC

## 2018-10-25 NOTE — Progress Notes (Signed)
Sugar escalated over the last ~2 months.  Prev was usullay ~200 or lower, higher more recently.  Sugar up to 500 last night.  Has been taking 20 units recently, had been on 14 units prev and gradually inc'd his dose.   Sugar 225 this AM, with higher dose of insulin.  Discussed limiting carbs, he had been eating more potatoes in the last month.    He has more urine output recently, with higher sugar.    He had some L flank pain after heavy lifting with replacing an axle on his trailer.  No other trauma and he is better in the meantime.  No blood in urine or stool.    Meds, vitals, and allergies reviewed.   ROS: Per HPI unless specifically indicated in ROS section   GEN: nad, alert and oriented HEENT: ncat NECK: supple w/o LA CV: rrr.  PULM: ctab, no inc wob ABD: soft, +bs, L flank not ttp-reassured, he will update me as needed. EXT: trace BLE edema SKIN: no acute rash

## 2018-10-25 NOTE — Patient Instructions (Signed)
Go to the lab on the way out.  We'll contact you with your lab report. Gradually increase your insulin.  Limit the potatoes.  Update me about your sugar and dose in about 10 days, sooner if needed.  Take care.  Glad to see you.

## 2018-10-26 NOTE — Assessment & Plan Note (Signed)
See notes on orders, about insulin increase.  Add 1 to 2 units/day until his a.m. sugars below 160. Limit the potatoes/carbohydrates.  He will update me about his sugar and dose in about 10 days, sooner if needed.  See notes on labs.

## 2018-11-16 ENCOUNTER — Other Ambulatory Visit: Payer: Self-pay | Admitting: *Deleted

## 2018-11-16 MED ORDER — GLUCOSE BLOOD VI STRP: ORAL_STRIP | 0 refills | Status: DC

## 2018-11-16 NOTE — Telephone Encounter (Signed)
Patient called stating that he needs a new script sent to the pharmacy for his one touch ultra test strips #50.  Christian [name]

## 2018-11-16 NOTE — Telephone Encounter (Signed)
Refill sent to pharmacy electronically. °

## 2018-11-21 DIAGNOSIS — H2513 Age-related nuclear cataract, bilateral: Secondary | ICD-10-CM | POA: Diagnosis not present

## 2018-11-21 DIAGNOSIS — H524 Presbyopia: Secondary | ICD-10-CM | POA: Diagnosis not present

## 2018-11-21 DIAGNOSIS — E119 Type 2 diabetes mellitus without complications: Secondary | ICD-10-CM | POA: Diagnosis not present

## 2018-11-21 LAB — HM DIABETES EYE EXAM

## 2018-11-23 ENCOUNTER — Other Ambulatory Visit: Payer: Self-pay | Admitting: Orthopedic Surgery

## 2018-11-23 DIAGNOSIS — M48061 Spinal stenosis, lumbar region without neurogenic claudication: Secondary | ICD-10-CM

## 2018-11-25 ENCOUNTER — Other Ambulatory Visit: Payer: Self-pay | Admitting: Family Medicine

## 2018-11-29 ENCOUNTER — Ambulatory Visit
Admission: RE | Admit: 2018-11-29 | Discharge: 2018-11-29 | Disposition: A | Discharge status: Home / Self Care | Payer: Medicare Other | Source: Ambulatory Visit | Attending: Orthopedic Surgery | Admitting: Orthopedic Surgery

## 2018-11-29 ENCOUNTER — Other Ambulatory Visit: Payer: Self-pay

## 2018-11-29 DIAGNOSIS — G8929 Other chronic pain: Secondary | ICD-10-CM | POA: Diagnosis not present

## 2018-11-29 DIAGNOSIS — M545 Low back pain: Secondary | ICD-10-CM | POA: Diagnosis not present

## 2018-11-29 DIAGNOSIS — M48061 Spinal stenosis, lumbar region without neurogenic claudication: Secondary | ICD-10-CM

## 2018-11-29 MED ORDER — METHYLPREDNISOLONE ACETATE 40 MG/ML INJ SUSP (RADIOLOG
120.0000 mg | Freq: Once | INTRAMUSCULAR | Status: AC
  Administered 2018-11-29: 120 mg via EPIDURAL

## 2018-11-29 MED ORDER — IOPAMIDOL (ISOVUE-M 200) INJECTION 41%
1.0000 mL | Freq: Once | INTRAMUSCULAR | Status: AC
  Administered 2018-11-29: 1 mL via EPIDURAL

## 2018-11-30 DIAGNOSIS — L570 Actinic keratosis: Secondary | ICD-10-CM | POA: Diagnosis not present

## 2018-11-30 DIAGNOSIS — L821 Other seborrheic keratosis: Secondary | ICD-10-CM | POA: Diagnosis not present

## 2018-11-30 DIAGNOSIS — D229 Melanocytic nevi, unspecified: Secondary | ICD-10-CM | POA: Diagnosis not present

## 2018-11-30 DIAGNOSIS — L814 Other melanin hyperpigmentation: Secondary | ICD-10-CM | POA: Diagnosis not present

## 2019-01-05 ENCOUNTER — Ambulatory Visit (INDEPENDENT_AMBULATORY_CARE_PROVIDER_SITE_OTHER): Payer: Medicare Other

## 2019-01-05 DIAGNOSIS — Z23 Encounter for immunization: Secondary | ICD-10-CM | POA: Diagnosis not present

## 2019-01-13 ENCOUNTER — Encounter: Payer: Self-pay | Admitting: Family Medicine

## 2019-01-26 ENCOUNTER — Encounter: Payer: Self-pay | Admitting: Family Medicine

## 2019-01-26 ENCOUNTER — Other Ambulatory Visit: Payer: Self-pay

## 2019-01-26 ENCOUNTER — Ambulatory Visit (INDEPENDENT_AMBULATORY_CARE_PROVIDER_SITE_OTHER): Payer: Medicare Other | Admitting: Podiatry

## 2019-01-26 ENCOUNTER — Ambulatory Visit (INDEPENDENT_AMBULATORY_CARE_PROVIDER_SITE_OTHER): Payer: Medicare Other | Admitting: Family Medicine

## 2019-01-26 VITALS — BP 140/88 | HR 73 | Temp 97.3°F | Ht 74.0 in | Wt 223.0 lb

## 2019-01-26 DIAGNOSIS — E1142 Type 2 diabetes mellitus with diabetic polyneuropathy: Secondary | ICD-10-CM

## 2019-01-26 DIAGNOSIS — L97421 Non-pressure chronic ulcer of left heel and midfoot limited to breakdown of skin: Secondary | ICD-10-CM

## 2019-01-26 DIAGNOSIS — M2041 Other hammer toe(s) (acquired), right foot: Secondary | ICD-10-CM | POA: Diagnosis not present

## 2019-01-26 DIAGNOSIS — M2011 Hallux valgus (acquired), right foot: Secondary | ICD-10-CM

## 2019-01-26 DIAGNOSIS — L84 Corns and callosities: Secondary | ICD-10-CM | POA: Diagnosis not present

## 2019-01-26 DIAGNOSIS — M2042 Other hammer toe(s) (acquired), left foot: Secondary | ICD-10-CM

## 2019-01-26 DIAGNOSIS — E1169 Type 2 diabetes mellitus with other specified complication: Secondary | ICD-10-CM

## 2019-01-26 DIAGNOSIS — E11621 Type 2 diabetes mellitus with foot ulcer: Secondary | ICD-10-CM | POA: Diagnosis not present

## 2019-01-26 DIAGNOSIS — M2012 Hallux valgus (acquired), left foot: Secondary | ICD-10-CM | POA: Diagnosis not present

## 2019-01-26 DIAGNOSIS — E119 Type 2 diabetes mellitus without complications: Secondary | ICD-10-CM

## 2019-01-26 DIAGNOSIS — B351 Tinea unguium: Secondary | ICD-10-CM | POA: Diagnosis not present

## 2019-01-26 DIAGNOSIS — E1129 Type 2 diabetes mellitus with other diabetic kidney complication: Secondary | ICD-10-CM

## 2019-01-26 LAB — POCT GLYCOSYLATED HEMOGLOBIN (HGB A1C): Hemoglobin A1C: 12.9 % — AB (ref 4.0–5.6)

## 2019-01-26 MED ORDER — LEVEMIR FLEXTOUCH 100 UNIT/ML ~~LOC~~ SOPN: PEN_INJECTOR | SUBCUTANEOUS | Status: DC

## 2019-01-26 NOTE — Progress Notes (Signed)
Subjective:  Patient ID: Christian [name], male    DOB: [birth year],  MRN: [id number]  No chief complaint on file.   78 y.o. male presents  for diabetic foot care. Last AMBS was unkown. Last A1c 12.9. Unsure how the callus started but states it has been bleeding recently. Referred for eval by Dr. [name]. Reports numbness and tingling in their feet. Denies cramping in legs and thighs.  Review of Systems: Negative except as noted in the HPI. Denies N/V/F/Ch.  Past Medical History:  Diagnosis Date  . Crushing injury of pelvic region   . Diabetes mellitus without complication ([place])   . Hyperlipidemia   . Hypertension     Current Outpatient Medications:  .  amLODipine (NORVASC) 2.5 MG tablet, Take 1 tablet (2.5 mg total) by mouth daily., Disp: 90 tablet, Rfl: 3 .  atorvastatin (LIPITOR) 80 MG tablet, Take 1 tablet (80 mg total) by mouth at bedtime., Disp: 90 tablet, Rfl: 1 .  gabapentin (NEURONTIN) 300 MG capsule, Take one capsule by mouth every morning and 2 capsules by mouth at bedtime., Disp: 270 capsule, Rfl: 3 .  glipiZIDE (GLUCOTROL) 5 MG tablet, TAKE 2 TABLETS BY MOUTH ONCE DAILY IN THE MORNING AND 1 AT NIGHT, Disp: 90 tablet, Rfl: 5 .  glucose blood (ONE TOUCH ULTRA TEST) test strip, Use to check blood sugar once a day. Dx Code E11.42, Disp: 100 each, Rfl: 0 .  Insulin Detemir (LEVEMIR FLEXTOUCH) 100 UNIT/ML Pen, Inject 22 units daily. Increase by 1 units per day until AM sugar is below 150., Disp: , Rfl:  .  Insulin Pen Needle (PEN NEEDLES) 31G X 8 MM MISC, Use to inject Levemir daily. Dx E11.42   Insulin dependent., Disp: 100 each, Rfl: 3 .  metFORMIN (GLUCOPHAGE) 1000 MG tablet, Take 1 tablet (1,000 mg total) by mouth 2 (two) times daily., Disp: 180 tablet, Rfl: 3 .  pantoprazole (PROTONIX) 40 MG tablet, Take 1 tablet (40 mg total) by mouth 2 (two) times daily., Disp: 60 tablet, Rfl: 11 .  vitamin C (ASCORBIC ACID) 500 MG tablet, Take 500 mg by mouth daily., Disp: , Rfl:   Social  History   Tobacco Use  Smoking Status Never Smoker  Smokeless Tobacco Never Used    No Known Allergies Objective:  There were no vitals filed for this visit. There is no height or weight on file to calculate BMI. Constitutional Well developed. Well nourished.  Vascular Dorsalis pedis pulses present 1+ bilaterally  Posterior tibial pulses present 1+ bilaterally  Pedal hair growth diminished. Capillary refill normal to all digits.  No cyanosis or clubbing noted.  Neurologic Normal speech. Oriented to person, place, and time. Epicritic sensation to light touch grossly present bilaterally. Protective sensation with 5.07 monofilament  absent bilaterally.  Dermatologic Nails elongated, thickened, dystrophic. Left 1st metatarsal ulcer with overlying hyperkeratosis and measuring 0.5x0.5 post-debridement. No warmth erythema drainage signs of acute infection. Right 1st metatarsal pre-ulcerative callus.  Orthopedic: Normal joint ROM without pain or crepitus bilaterally. No visible deformities. No bony tenderness. Plantar flexed 1st met bilaterally. Hammertoes bilat. HAV bilat   Assessment:   1. 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 Onychomycosis of multiple toenails with type 2 diabetes mellitus and peripheral neuropathy ([facility])   3. Callus   4. Hammer toes of both feet   5. Acquired hallux valgus of both feet    Plan:  Patient was evaluated and treated  and all questions answered.  Diabetes with DPN, Onychomycosis -Educated on diabetic footcare. Diabetic risk level 3 -Nails x10 debrided sharply and manually with large nail nipper and rotary burr.  -Would benefit from DM shoes will make appt for patient.  Procedure: Nail Debridement Rationale: Patient meets criteria for routine foot care due to DPN Type of Debridement: manual, sharp debridement. Instrumentation: Nail nipper, rotary burr. Number of Nails: 10    Procedure: Paring of Lesion Rationale: painful hyperkeratotic lesion Type of Debridement: manual, sharp debridement. Instrumentation: 312 blade Number of Lesions: 1   Diabetic Ulcer Left 1st -Wound debrided as below -No signs of infection noted today. -Offloaded with surgical shoe -Abx ointment and band-aid applied. -Patient to apply daily  Procedure: Excisional Debridement of Wound Rationale: Removal of non-viable soft tissue from the wound to promote healing.  Anesthesia: none Pre-Debridement Wound Measurements: overlying hyperkeratosis  Post-Debridement Wound Measurements: 0.5 cm x 0.5 cm x 0.1 cm  Type of Debridement: Sharp Excisional Tissue Removed: Non-viable soft tissue Depth of Debridement: subcutaneous tissue. Technique: Sharp excisional debridement to bleeding, viable wound base.  Dressing: Dry, sterile, compression dressing. Disposition: Patient tolerated procedure well. Patient to return in 1 week for follow-up.     F/u in 1 month for recheck.   Return in about 1 month (around 02/26/2019) for Wound Care, Left.

## 2019-01-26 NOTE — Progress Notes (Signed)
Diabetes:  Using medications without difficulties: yes, taking 20-22 units of insulin a day.   Hypoglycemic episodes: no Hyperglycemic episodes: see below.  Feet problems: on gabapentin at baseline.   Blood Sugars averaging: see below.  A1c up, d/w pt.    Ever since his back injection in the summer, his sugars have been diffusely elevated, 200 in the AM and 300s later in the day.    Glans of penis irritated per patient report, chronic issue noted by patient.  He has some back pain that continues and has R leg paresthesia at baseline.  He is considering options.  Unless he had emergent sx, his A1c would likely preclude surgery at this point until he can get his sugar lower.  No weakness in BLE.    He has post nasal gtt each AM, going on for years.  No fevers.  Meds, vitals, and allergies reviewed.   ROS: Per HPI unless specifically indicated in ROS section   GEN: nad, alert and oriented HEENT: ncat NECK: supple w/o LA CV: rrr. PULM: ctab, no inc wob ABD: soft, +bs EXT: no edema SKIN: no acute rash but fungal changes noted on the glans of the penis.  Diabetic foot exam: Abnormal inspection Old blister L ankle noted- appears to be healing.  Calluses noted, esp distal L 1st MT Normal DP pulses Dec sensation to light touch and monofilament L 1st nail with chronic changes.

## 2019-01-26 NOTE — Patient Instructions (Addendum)
I would stop the cinnamon.   Gradually increase your insulin by 1 unit per day, until your AM sugar is ~150.  Recheck in 3 months with A1c at the visit but update me in about 10 days.  We'll call about the foot clinic appointment.  Take care.  Glad to see you.

## 2019-01-29 NOTE — Assessment & Plan Note (Signed)
Gradually increase insulin by 1 unit per day, until AM sugar is ~150.  Recheck in 3 months with A1c at the visit but he will update me in about 10 days, sooner if needed We'll call about the foot clinic appointment.  Foot care discussed with patient. He can use topical ketoconazole or similar cream for glans irritation is likely fungal.  Will need better sugar control to get this improved.  Discussed.  He is not at the point of wanting to have an intervention for his back.  Unless he had emergent symptoms, his sugar level at this point would likely preclude intervention.  Discussed with patient.  He agrees. >25 minutes spent in face to face time with patient, >50% spent in counselling or coordination of care.

## 2019-02-03 ENCOUNTER — Other Ambulatory Visit: Payer: Self-pay | Admitting: *Deleted

## 2019-02-03 MED ORDER — PANTOPRAZOLE SODIUM 40 MG PO TBEC: 40.0000 mg | DELAYED_RELEASE_TABLET | Freq: Two times a day (BID) | ORAL | 2 refills | Status: DC

## 2019-02-06 ENCOUNTER — Ambulatory Visit: Payer: Medicare Other | Admitting: Orthotics

## 2019-02-06 ENCOUNTER — Other Ambulatory Visit: Payer: Self-pay

## 2019-02-06 DIAGNOSIS — L97421 Non-pressure chronic ulcer of left heel and midfoot limited to breakdown of skin: Secondary | ICD-10-CM

## 2019-02-06 DIAGNOSIS — M2011 Hallux valgus (acquired), right foot: Secondary | ICD-10-CM

## 2019-02-06 DIAGNOSIS — M2042 Other hammer toe(s) (acquired), left foot: Secondary | ICD-10-CM

## 2019-02-06 DIAGNOSIS — E11621 Type 2 diabetes mellitus with foot ulcer: Secondary | ICD-10-CM

## 2019-02-06 DIAGNOSIS — M2012 Hallux valgus (acquired), left foot: Secondary | ICD-10-CM

## 2019-02-06 DIAGNOSIS — M2041 Other hammer toe(s) (acquired), right foot: Secondary | ICD-10-CM

## 2019-02-06 DIAGNOSIS — L84 Corns and callosities: Secondary | ICD-10-CM

## 2019-02-06 NOTE — Progress Notes (Signed)

## 2019-02-21 ENCOUNTER — Other Ambulatory Visit: Payer: Self-pay | Admitting: *Deleted

## 2019-02-21 MED ORDER — GLIPIZIDE 5 MG PO TABS: ORAL_TABLET | ORAL | 1 refills | Status: DC

## 2019-02-23 ENCOUNTER — Other Ambulatory Visit: Payer: Self-pay

## 2019-02-23 ENCOUNTER — Ambulatory Visit (INDEPENDENT_AMBULATORY_CARE_PROVIDER_SITE_OTHER): Payer: Medicare Other | Admitting: Podiatry

## 2019-02-23 VITALS — Temp 98.0°F

## 2019-02-23 DIAGNOSIS — L97421 Non-pressure chronic ulcer of left heel and midfoot limited to breakdown of skin: Secondary | ICD-10-CM | POA: Diagnosis not present

## 2019-02-23 DIAGNOSIS — E11621 Type 2 diabetes mellitus with foot ulcer: Secondary | ICD-10-CM | POA: Diagnosis not present

## 2019-03-08 ENCOUNTER — Other Ambulatory Visit: Payer: Self-pay

## 2019-03-17 ENCOUNTER — Ambulatory Visit: Payer: Medicare Other | Admitting: Orthotics

## 2019-03-17 ENCOUNTER — Other Ambulatory Visit: Payer: Self-pay

## 2019-03-17 DIAGNOSIS — M2012 Hallux valgus (acquired), left foot: Secondary | ICD-10-CM | POA: Diagnosis not present

## 2019-03-17 DIAGNOSIS — M2011 Hallux valgus (acquired), right foot: Secondary | ICD-10-CM | POA: Diagnosis not present

## 2019-03-17 DIAGNOSIS — M2041 Other hammer toe(s) (acquired), right foot: Secondary | ICD-10-CM | POA: Diagnosis not present

## 2019-03-17 DIAGNOSIS — M2042 Other hammer toe(s) (acquired), left foot: Secondary | ICD-10-CM

## 2019-03-17 DIAGNOSIS — L84 Corns and callosities: Secondary | ICD-10-CM | POA: Diagnosis not present

## 2019-03-17 DIAGNOSIS — E1140 Type 2 diabetes mellitus with diabetic neuropathy, unspecified: Secondary | ICD-10-CM | POA: Diagnosis not present

## 2019-03-20 ENCOUNTER — Other Ambulatory Visit: Payer: Self-pay | Admitting: Family Medicine

## 2019-03-23 ENCOUNTER — Other Ambulatory Visit: Payer: Self-pay | Admitting: *Deleted

## 2019-03-23 MED ORDER — ONETOUCH ULTRA VI STRP: ORAL_STRIP | 2 refills | Status: DC

## 2019-04-03 ENCOUNTER — Other Ambulatory Visit: Payer: Self-pay | Admitting: Family Medicine

## 2019-04-03 DIAGNOSIS — E1142 Type 2 diabetes mellitus with diabetic polyneuropathy: Secondary | ICD-10-CM

## 2019-04-13 ENCOUNTER — Other Ambulatory Visit: Payer: Self-pay | Admitting: Family Medicine

## 2019-04-17 DIAGNOSIS — D485 Neoplasm of uncertain behavior of skin: Secondary | ICD-10-CM | POA: Diagnosis not present

## 2019-04-17 DIAGNOSIS — L570 Actinic keratosis: Secondary | ICD-10-CM | POA: Diagnosis not present

## 2019-04-17 DIAGNOSIS — L820 Inflamed seborrheic keratosis: Secondary | ICD-10-CM | POA: Diagnosis not present

## 2019-04-25 ENCOUNTER — Encounter: Payer: Self-pay | Admitting: Family Medicine

## 2019-04-25 ENCOUNTER — Ambulatory Visit (INDEPENDENT_AMBULATORY_CARE_PROVIDER_SITE_OTHER): Payer: Medicare Other | Admitting: Family Medicine

## 2019-04-25 ENCOUNTER — Other Ambulatory Visit: Payer: Self-pay

## 2019-04-25 VITALS — BP 132/78 | HR 83 | Temp 97.4°F | Resp 18 | Ht 73.5 in | Wt 224.0 lb

## 2019-04-25 DIAGNOSIS — E1142 Type 2 diabetes mellitus with diabetic polyneuropathy: Secondary | ICD-10-CM | POA: Diagnosis not present

## 2019-04-25 LAB — POCT GLYCOSYLATED HEMOGLOBIN (HGB A1C): Hemoglobin A1C: 11.8 % — AB (ref 4.0–5.6)

## 2019-04-25 MED ORDER — GLIPIZIDE 5 MG PO TABS: ORAL_TABLET | ORAL | 3 refills | Status: DC

## 2019-04-25 MED ORDER — LEVEMIR FLEXTOUCH 100 UNIT/ML ~~LOC~~ SOPN: PEN_INJECTOR | SUBCUTANEOUS | 12 refills | Status: DC

## 2019-04-25 MED ORDER — METFORMIN HCL 1000 MG PO TABS: 1000.0000 mg | ORAL_TABLET | Freq: Two times a day (BID) | ORAL | 3 refills | Status: DC

## 2019-04-25 NOTE — Progress Notes (Signed)
This visit occurred during the SARS-CoV-2 public health emergency.  Safety protocols were in place, including screening questions prior to the visit, additional usage of staff PPE, and extensive cleaning of exam room while observing appropriate contact time as indicated for disinfecting solutions.  Diabetes:  Using medications without difficulties: see below.   Hypoglycemic episodes: no  Hyperglycemic episodes: see below Feet problems: on gabapentin at baseline with f/u pending at foot clinic.  Blood Sugars averaging: usually ~250, higher after getting off insulin.  Diet d/w pt.   eye exam within last year:  He is recently out of insulin.  D/w pt.  He was prev up to 22 units.   Meds, vitals, and allergies reviewed.  ROS: Per HPI unless specifically indicated in ROS section   GEN: nad, alert and oriented HEENT: ncat NECK: supple w/o LA CV: rrr. PULM: ctab, no inc wob ABD: soft, +bs EXT: no edema SKIN: no acute rash  Diabetic foot exam: Normal inspection No skin breakdown Callus L 1st MTP  Normal DP pulses Dec sensation to light touch and monofilament L 1st nail absent

## 2019-04-25 NOTE — Patient Instructions (Addendum)
[email or web address]  Restart 10 units a day since you have been off insulin.   Gradually increase insulin by 1 unit per day, until AM sugar is ~150.  You will likely have to go up to at least 30 units.    Recheck A1c in about 3 months at a visit like today.  If your sugar isn't a lot better by the time you get to 30 units or if you have trouble getting your medicine, then let me know.    Take care.  Glad to see you.

## 2019-04-26 NOTE — Assessment & Plan Note (Signed)
A1c discussed with patient at office visit.  I was not expecting this to be controlled, neither was the patient, since he had been off of insulin recently. He was previously up to 22 units.  Discussed options.  Insulin refill sent.  Restart 10 units a day since he has been off insulin.   Gradually increase insulin by 1 unit per day, until AM sugar is ~150.  He will likely have to go up to at least 30 units.    Recheck A1c in about 3 months at a visit like today.  If sugar isn't a lot better by the time he gets to 30 units or if he has trouble getting his medicine, then he will let me know.

## 2019-04-27 ENCOUNTER — Ambulatory Visit (INDEPENDENT_AMBULATORY_CARE_PROVIDER_SITE_OTHER): Payer: Medicare Other | Admitting: Podiatry

## 2019-04-27 ENCOUNTER — Other Ambulatory Visit: Payer: Self-pay

## 2019-04-27 DIAGNOSIS — E1169 Type 2 diabetes mellitus with other specified complication: Secondary | ICD-10-CM

## 2019-04-27 DIAGNOSIS — B351 Tinea unguium: Secondary | ICD-10-CM | POA: Diagnosis not present

## 2019-04-27 DIAGNOSIS — E1142 Type 2 diabetes mellitus with diabetic polyneuropathy: Secondary | ICD-10-CM

## 2019-05-01 ENCOUNTER — Other Ambulatory Visit: Payer: Self-pay | Admitting: Family Medicine

## 2019-05-23 ENCOUNTER — Ambulatory Visit: Payer: Medicare Other | Attending: Internal Medicine

## 2019-05-23 DIAGNOSIS — Z23 Encounter for immunization: Secondary | ICD-10-CM

## 2019-05-23 NOTE — Progress Notes (Signed)
   Covid-19 Vaccination Clinic  Name:  Christian [name]    MRN: [id number] DOB: April 04, 1941  05/23/2019  [name] was observed post Covid-19 immunization for 15 minutes without incidence. He was provided with Vaccine Information Sheet and instruction to access the V-Safe system.   [name] was instructed to call 911 with any severe reactions post vaccine: Marland Kitchen Difficulty breathing  . Swelling of your face and throat  . A fast heartbeat  . A bad rash all over your body  . Dizziness and weakness    Immunizations Administered    Name Date Dose VIS Date Route   Pfizer COVID-19 Vaccine 05/23/2019 12:51 PM 0.3 mL 03/24/2019 Intramuscular   Manufacturer: [place]   Lot: [id number]   [place]: [id number]

## 2019-05-25 ENCOUNTER — Ambulatory Visit: Payer: Medicare Other

## 2019-06-17 ENCOUNTER — Ambulatory Visit: Payer: Medicare Other | Attending: Internal Medicine

## 2019-06-17 DIAGNOSIS — Z23 Encounter for immunization: Secondary | ICD-10-CM

## 2019-06-17 NOTE — Progress Notes (Signed)
   Covid-19 Vaccination Clinic  Name:  Christian [name]    MRN: [id number] DOB: 06/28/1940  06/17/2019  [name] was observed post Covid-19 immunization for 15 minutes without incident. He was provided with Vaccine Information Sheet and instruction to access the V-Safe system.   [name] was instructed to call 911 with any severe reactions post vaccine: Marland Kitchen Difficulty breathing  . Swelling of face and throat  . A fast heartbeat  . A bad rash all over body  . Dizziness and weakness   Immunizations Administered    Name Date Dose VIS Date Route   Pfizer COVID-19 Vaccine 06/17/2019  8:13 AM 0.3 mL 03/24/2019 Intramuscular   Manufacturer: [place]   Lot: [id number]   [place]: [id number]

## 2019-06-23 ENCOUNTER — Encounter: Payer: Self-pay | Admitting: Family Medicine

## 2019-06-23 ENCOUNTER — Ambulatory Visit (INDEPENDENT_AMBULATORY_CARE_PROVIDER_SITE_OTHER): Payer: Medicare Other | Admitting: Family Medicine

## 2019-06-23 ENCOUNTER — Other Ambulatory Visit: Payer: Self-pay

## 2019-06-23 VITALS — BP 142/70 | HR 92 | Temp 96.0°F | Ht 73.5 in | Wt 219.5 lb

## 2019-06-23 DIAGNOSIS — M79646 Pain in unspecified finger(s): Secondary | ICD-10-CM | POA: Diagnosis not present

## 2019-06-23 DIAGNOSIS — E119 Type 2 diabetes mellitus without complications: Secondary | ICD-10-CM

## 2019-06-23 DIAGNOSIS — E1142 Type 2 diabetes mellitus with diabetic polyneuropathy: Secondary | ICD-10-CM | POA: Diagnosis not present

## 2019-06-23 LAB — POCT GLYCOSYLATED HEMOGLOBIN (HGB A1C): Hemoglobin A1C: 9.1 % — AB (ref 4.0–5.6)

## 2019-06-23 MED ORDER — LEVEMIR FLEXTOUCH 100 UNIT/ML ~~LOC~~ SOPN: PEN_INJECTOR | SUBCUTANEOUS | Status: DC

## 2019-06-23 MED ORDER — METFORMIN HCL 1000 MG PO TABS: 500.0000 mg | ORAL_TABLET | Freq: Two times a day (BID) | ORAL | Status: DC

## 2019-06-23 NOTE — Progress Notes (Signed)
This visit occurred during the SARS-CoV-2 public health emergency.  Safety protocols were in place, including screening questions prior to the visit, additional usage of staff PPE, and extensive cleaning of exam room while observing appropriate contact time as indicated for disinfecting solutions.  Diabetes:  Using medications without difficulties: taking max 18 units insulin per day now.   Hypoglycemic episodes:no Hyperglycemic episodes: not recently, see below.   Feet problems: numbness at baseline.   Blood Sugars averaging: he had prev run out of insulin, restarted in the meantime, now with sugars in the lower 100s.   A1c done at [facility]. Improved to 9.1 and this may lag his progress, d/w pt.   He has had episodic diarrhea.  Wrist/thumb pain. He put a mattress/springs in storage last week.  He was sliding it and didn't have pain at the time.  Then that night he had R wrist throbbing and pain.  Now with pain along extensor side of the R thumb.    He had covid vaccine, d/w pt.    Meds, vitals, and allergies reviewed.   ROS: Per HPI unless specifically indicated in ROS section   GEN: nad, alert and oriented HEENT: ncat NECK: supple w/o LA CV: rrr. PULM: ctab, no inc wob ABD: soft, +bs EXT: no edema SKIN: no acute rash Medial R thumb pain on range of motion, feels better in splint fashioned at the office visit.  Not extensor side tendonitis.  No tendon failure.  Distally vascular intact.

## 2019-06-23 NOTE — Patient Instructions (Signed)
Cut back on metformin to 500mg  twice a day and see if the diarrhea gets better.  Increase your insulin if needed after you cut back on metformin.  Recheck in about 3 months.  Labs at the visit.  Take care.  Glad to see you. Update me as needed.   Wear the splint for a few days and than gradually wean out.  Thanks for getting vaccinated.

## 2019-06-25 DIAGNOSIS — M79646 Pain in unspecified finger(s): Secondary | ICD-10-CM | POA: Insufficient documentation

## 2019-06-25 NOTE — Assessment & Plan Note (Signed)
Blood Sugars averaging: he had prev run out of insulin, restarted in the meantime, now with sugars in the lower 100s.   A1c done at OV. Improved to 9.1 and this may lag his progress, d/w pt.   He has had episodic diarrhea. He can cut back on metformin to 500mg  twice a day and see if the diarrhea gets better.  Increase insulin if needed after cutting back on metformin.  Recheck in about 3 months.  Labs at the visit.  Update me as needed.  He agrees with plan.

## 2019-06-25 NOTE — Assessment & Plan Note (Signed)
He felt better when splinted.  Likely from tendinitis due to strain lifting the mattress.  He can wear the splint for a few days and then update me as needed.  He agrees.  No need for imaging at this point.

## 2019-07-12 NOTE — Progress Notes (Signed)
  Subjective:  Patient ID: Christian [name], male    DOB: May 29, 1940,  MRN: [id number]  Chief Complaint  Patient presents with  . Nail Problem    Nail trim 1-5 bilateral  . Callouses    Bilateral plantar forefoot callous trim    79 y.o. male presents with the above complaint. History confirmed with patient.   Objective:  Physical Exam: warm, good capillary refill, nail exam onychomycosis of the toenails, no trophic changes or ulcerative lesions. DP pulses palpable, PT pulses palpable and protective sensation absent  No images are attached to the encounter.  Assessment:   1. Onychomycosis of multiple toenails with type 2 diabetes mellitus and peripheral neuropathy ([place])      Plan:  Patient was evaluated and treated and all questions answered.  Onychomycosis, Diabetes and DPN -Patient is diabetic with a qualifying condition for at risk foot care.  Procedure: Nail Debridement Rationale: Patient meets criteria for routine foot care due to DPN Type of Debridement: manual, sharp debridement. Instrumentation: Nail nipper, rotary burr. Number of Nails: 10  Return in about 3 months (around 07/26/2019) for Diabetic Foot Care.

## 2019-07-17 ENCOUNTER — Other Ambulatory Visit: Payer: Self-pay | Admitting: Family Medicine

## 2019-07-20 ENCOUNTER — Ambulatory Visit (INDEPENDENT_AMBULATORY_CARE_PROVIDER_SITE_OTHER): Payer: Medicare Other | Admitting: Podiatry

## 2019-07-20 ENCOUNTER — Other Ambulatory Visit: Payer: Self-pay

## 2019-07-20 VITALS — Temp 97.6°F

## 2019-07-20 DIAGNOSIS — E1169 Type 2 diabetes mellitus with other specified complication: Secondary | ICD-10-CM | POA: Diagnosis not present

## 2019-07-20 DIAGNOSIS — E1142 Type 2 diabetes mellitus with diabetic polyneuropathy: Secondary | ICD-10-CM

## 2019-07-20 DIAGNOSIS — B351 Tinea unguium: Secondary | ICD-10-CM

## 2019-07-24 ENCOUNTER — Other Ambulatory Visit: Payer: Self-pay | Admitting: Family Medicine

## 2019-09-25 ENCOUNTER — Ambulatory Visit (INDEPENDENT_AMBULATORY_CARE_PROVIDER_SITE_OTHER): Payer: Medicare Other | Admitting: Family Medicine

## 2019-09-25 ENCOUNTER — Other Ambulatory Visit: Payer: Self-pay

## 2019-09-25 ENCOUNTER — Other Ambulatory Visit: Payer: Self-pay | Admitting: Orthopedic Surgery

## 2019-09-25 ENCOUNTER — Encounter: Payer: Self-pay | Admitting: Family Medicine

## 2019-09-25 VITALS — BP 152/78 | HR 76 | Temp 97.6°F | Ht 73.5 in | Wt 222.3 lb

## 2019-09-25 DIAGNOSIS — E1142 Type 2 diabetes mellitus with diabetic polyneuropathy: Secondary | ICD-10-CM

## 2019-09-25 DIAGNOSIS — M545 Low back pain, unspecified: Secondary | ICD-10-CM

## 2019-09-25 DIAGNOSIS — E119 Type 2 diabetes mellitus without complications: Secondary | ICD-10-CM

## 2019-09-25 DIAGNOSIS — M48061 Spinal stenosis, lumbar region without neurogenic claudication: Secondary | ICD-10-CM

## 2019-09-25 LAB — POCT GLYCOSYLATED HEMOGLOBIN (HGB A1C): Hemoglobin A1C: 8.6 % — AB (ref 4.0–5.6)

## 2019-09-25 MED ORDER — LEVEMIR FLEXTOUCH 100 UNIT/ML ~~LOC~~ SOPN: PEN_INJECTOR | SUBCUTANEOUS | Status: DC

## 2019-09-25 NOTE — Progress Notes (Signed)
This visit occurred during the SARS-CoV-2 public health emergency.  Safety protocols were in place, including screening questions prior to the visit, additional usage of staff PPE, and extensive cleaning of exam room while observing appropriate contact time as indicated for disinfecting solutions.  Diabetes:  Using medications without difficulties: yes, 20 units insulin with metformin and glipizide Hypoglycemic episodes: no Hyperglycemic episodes: no Feet problems: yes on gabapentin at baseline.   Blood Sugars averaging: ~130-180, rarely >200.  Higher with diet changes.   eye exam within last year: yes A1c better at 8.6.  This is still improving compared to prev.    He is concerned about his back with more numbness in the R leg.   After working in the yard/mowing, he had R>L sciatica/radicular sx.  His balance is affected.  He had prev injections in his back, but not since 11/2018.  His A1c should allow injection at this point, d/w pt.  Injections prev helped.  No weakness.    Meds, vitals, and allergies reviewed.   ROS: Per HPI unless specifically indicated in ROS section   GEN: nad, alert and oriented HEENT: ncat NECK: supple w/o LA CV: rrr. PULM: ctab, no inc wob ABD: soft, +bs EXT: no edema SKIN: no acute rash Sensation and gross motor intact on the legs.    Diabetic foot exam: Normal inspection No skin breakdown No calluses on R foot but noted on L foot w/o ulceration.   Normal DP pulses Dec sensation to light touch and monofilament on the feet Nails thickened

## 2019-09-25 NOTE — Patient Instructions (Addendum)
Please call Dr. [facility] and see if you can get scheduled there.  If you need a referral then let me know.   Okay to gradually increase your insulin by 1-2 units at a time if needed.   Take care.  Glad to see you.  Recheck in about 3-4 months with labs prior to a physical.

## 2019-09-27 DIAGNOSIS — M545 Low back pain, unspecified: Secondary | ICD-10-CM | POA: Insufficient documentation

## 2019-09-27 NOTE — Assessment & Plan Note (Signed)
Discussed options.  He will call about follow-up and let me know if he needs referral.

## 2019-09-27 NOTE — Assessment & Plan Note (Signed)
A1c better at 8.6.  This is still improving compared to prev.    Okay to gradually increase insulin by 1-2 units at a time if needed.   Recheck in about 3-4 months with labs prior to a physical.

## 2019-10-03 ENCOUNTER — Ambulatory Visit
Admission: RE | Admit: 2019-10-03 | Discharge: 2019-10-03 | Disposition: A | Discharge status: Home / Self Care | Payer: Medicare Other | Source: Ambulatory Visit | Attending: Orthopedic Surgery | Admitting: Orthopedic Surgery

## 2019-10-03 ENCOUNTER — Other Ambulatory Visit: Payer: Self-pay

## 2019-10-03 DIAGNOSIS — M48061 Spinal stenosis, lumbar region without neurogenic claudication: Secondary | ICD-10-CM

## 2019-10-03 MED ORDER — IOPAMIDOL (ISOVUE-M 200) INJECTION 41%
1.0000 mL | Freq: Once | INTRAMUSCULAR | Status: AC
  Administered 2019-10-03: 1 mL via EPIDURAL

## 2019-10-03 MED ORDER — METHYLPREDNISOLONE ACETATE 40 MG/ML INJ SUSP (RADIOLOG
120.0000 mg | Freq: Once | INTRAMUSCULAR | Status: AC
  Administered 2019-10-03: 120 mg via EPIDURAL

## 2019-10-09 NOTE — Progress Notes (Signed)
  Subjective:  Patient ID: EMIN FOREE, male    DOB: [birth year],  MRN: [id number]  Chief Complaint  Patient presents with  . Diabetes    Most recent HgbA1c per pt = "9". Fasting AM glucose today per pt = 185mg /dL.  . Nail Problem    Thick, long toenails.  . Callouses   79 y.o. male presents with the above complaint. History confirmed with patient.   Objective:  Physical Exam: warm, good capillary refill, nail exam onychomycosis of the toenails, no trophic changes or ulcerative lesions. DP pulses palpable, PT pulses palpable and protective sensation absent  No images are attached to the encounter.  Assessment:   1. Onychomycosis of multiple toenails with type 2 diabetes mellitus and peripheral neuropathy ([place])      Plan:  Patient was evaluated and treated and all questions answered.  Onychomycosis, Diabetes and DPN -Patient is diabetic with a qualifying condition for at risk foot care.   Procedure: Nail Debridement Rationale: Patient meets criteria for routine foot care due to DPN Type of Debridement: manual, sharp debridement. Instrumentation: Nail nipper, rotary burr. Number of Nails: 10   Return in about 3 months (around 10/19/2019) for Diabetic Foot Care.
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
## 2019-10-19 ENCOUNTER — Other Ambulatory Visit: Payer: Self-pay

## 2019-10-19 ENCOUNTER — Ambulatory Visit (INDEPENDENT_AMBULATORY_CARE_PROVIDER_SITE_OTHER): Payer: Medicare Other | Admitting: Podiatry

## 2019-10-19 DIAGNOSIS — E1142 Type 2 diabetes mellitus with diabetic polyneuropathy: Secondary | ICD-10-CM | POA: Diagnosis not present

## 2019-10-19 DIAGNOSIS — E1169 Type 2 diabetes mellitus with other specified complication: Secondary | ICD-10-CM

## 2019-10-19 DIAGNOSIS — E11621 Type 2 diabetes mellitus with foot ulcer: Secondary | ICD-10-CM | POA: Diagnosis not present

## 2019-10-19 DIAGNOSIS — B351 Tinea unguium: Secondary | ICD-10-CM

## 2019-10-19 DIAGNOSIS — L97521 Non-pressure chronic ulcer of other part of left foot limited to breakdown of skin: Secondary | ICD-10-CM

## 2019-10-19 DIAGNOSIS — L84 Corns and callosities: Secondary | ICD-10-CM | POA: Diagnosis not present

## 2019-10-19 NOTE — Progress Notes (Signed)
  Subjective:  Patient ID: Christian [name], male    DOB: [birth year],  MRN: [id number]  Chief Complaint  Patient presents with  . Nail Problem    Nail trim 1-5 bilateral  . Toe Injury    Left 1st toe abbrasion, 3 week duration "Rubbing in my shoes"   79 y.o. male presents with the above complaint. History confirmed with patient. Surgars running less than 200. Denies N/V/F/CH. New abrasion left hallux.  Objective:  Physical Exam: warm, good capillary refill, nail exam onychomycosis of the toenails, no trophic changes or ulcerative lesions. DP pulses palpable, PT pulses palpable and protective sensation absent. HPKs submet 1 bilat   Left hallux ulcer 0.5x0.5 distal tip no warmth erythema signs of infection fibrogranular base periwound intact wound base dry.  Assessment:   1. Onychomycosis of multiple toenails with type 2 diabetes mellitus and peripheral neuropathy (HCC)   2. Callus   3. Diabetic ulcer of toe of left foot associated with type 2 diabetes mellitus, limited to breakdown of skin ([place])    Plan:  Patient was evaluated and treated and all questions answered.  Onychomycosis, Diabetes and DPN -Patient is diabetic with a qualifying condition for at risk foot care.   Procedure: Nail Debridement Rationale: Patient meets criteria for routine foot care due to DPN Type of Debridement: manual, sharp debridement. Instrumentation: Nail nipper, rotary burr. Number of Nails: 10  Procedure: Paring of Lesion Rationale: painful hyperkeratotic lesion Type of Debridement: manual, sharp debridement. Instrumentation: 312 blade Number of Lesions: 2  Ulcer left hallux -Dressing applied consisting of medihoney and band-aid. Pt to apply ointment and band-aid daily -Offload with toe cap. Should this not alleviate pressure will have to use surgical shoe -Wound cleansed and debrided  Procedure: Selective Debridement of Wound Rationale: Removal of devitalized tissue from the wound to promote  healing.  Pre-Debridement Wound Measurements: 0.5 cm x 0.5 cm x 0.2 cm  Post-Debridement Wound Measurements: same as pre-debridement. Type of Debridement: sharp selective Tissue Removed: Devitalized soft-tissue Dressing: Dry, sterile, compression dressing. Disposition: Patient tolerated procedure well. Patient to return in 1 week for follow-up.   Return in about 1 month (around 11/19/2019) for Wound Care.

## 2019-11-02 ENCOUNTER — Other Ambulatory Visit: Payer: Self-pay | Admitting: Family Medicine

## 2019-11-07 ENCOUNTER — Other Ambulatory Visit: Payer: Self-pay | Admitting: *Deleted

## 2019-11-07 MED ORDER — RELION PEN NEEDLES 31G X 8 MM MISC: 3 refills | Status: DC

## 2019-11-20 ENCOUNTER — Other Ambulatory Visit: Payer: Self-pay | Admitting: Family Medicine

## 2019-11-20 ENCOUNTER — Other Ambulatory Visit: Payer: Self-pay | Admitting: *Deleted

## 2019-11-20 MED ORDER — GLIPIZIDE 5 MG PO TABS: ORAL_TABLET | ORAL | 1 refills | Status: DC

## 2019-11-21 ENCOUNTER — Ambulatory Visit: Payer: Medicare Other | Admitting: Podiatry

## 2019-11-21 DIAGNOSIS — E119 Type 2 diabetes mellitus without complications: Secondary | ICD-10-CM | POA: Diagnosis not present

## 2019-11-21 DIAGNOSIS — H2513 Age-related nuclear cataract, bilateral: Secondary | ICD-10-CM | POA: Diagnosis not present

## 2019-11-21 DIAGNOSIS — H524 Presbyopia: Secondary | ICD-10-CM | POA: Diagnosis not present

## 2019-11-21 LAB — HM DIABETES EYE EXAM

## 2019-11-23 ENCOUNTER — Other Ambulatory Visit: Payer: Self-pay | Admitting: *Deleted

## 2019-11-27 ENCOUNTER — Encounter: Payer: Self-pay | Admitting: Family Medicine

## 2019-12-27 ENCOUNTER — Other Ambulatory Visit: Payer: Self-pay | Admitting: Family Medicine

## 2019-12-27 DIAGNOSIS — E119 Type 2 diabetes mellitus without complications: Secondary | ICD-10-CM

## 2020-01-04 ENCOUNTER — Other Ambulatory Visit (INDEPENDENT_AMBULATORY_CARE_PROVIDER_SITE_OTHER): Payer: Medicare Other

## 2020-01-04 ENCOUNTER — Other Ambulatory Visit: Payer: Self-pay

## 2020-01-04 DIAGNOSIS — E119 Type 2 diabetes mellitus without complications: Secondary | ICD-10-CM

## 2020-01-04 LAB — MICROALBUMIN / CREATININE URINE RATIO
Creatinine,U: 162 mg/dL
Microalb Creat Ratio: 1 mg/g (ref 0.0–30.0)
Microalb, Ur: 1.6 mg/dL (ref 0.0–1.9)

## 2020-01-04 LAB — COMPREHENSIVE METABOLIC PANEL
ALT: 19 U/L (ref 0–53)
AST: 26 U/L (ref 0–37)
Albumin: 4.2 g/dL (ref 3.5–5.2)
Alkaline Phosphatase: 64 U/L (ref 39–117)
BUN: 15 mg/dL (ref 6–23)
CO2: 24 mEq/L (ref 19–32)
Calcium: 9 mg/dL (ref 8.4–10.5)
Chloride: 101 mEq/L (ref 96–112)
Creatinine, Ser: 1.27 mg/dL (ref 0.40–1.50)
GFR: 54.78 mL/min — ABNORMAL LOW (ref >60.00)
Glucose, Bld: 196 mg/dL — ABNORMAL HIGH (ref 70–99)
Potassium: 4.2 mEq/L (ref 3.5–5.1)
Sodium: 139 mEq/L (ref 135–145)
Total Bilirubin: 0.9 mg/dL (ref 0.2–1.2)
Total Protein: 7.3 g/dL (ref 6.0–8.3)

## 2020-01-04 LAB — LIPID PANEL
Cholesterol: 85 mg/dL (ref 0–200)
HDL: 27.7 mg/dL — ABNORMAL LOW (ref >39.00)
NonHDL: 57.28
Total CHOL/HDL Ratio: 3
Triglycerides: 391 mg/dL — ABNORMAL HIGH (ref 0.0–149.0)
VLDL: 78.2 mg/dL — ABNORMAL HIGH (ref 0.0–40.0)

## 2020-01-04 LAB — HEMOGLOBIN A1C: Hgb A1c MFr Bld: 9.8 % — ABNORMAL HIGH (ref 4.6–6.5)

## 2020-01-04 LAB — LDL CHOLESTEROL, DIRECT: Direct LDL: 19 mg/dL

## 2020-01-09 ENCOUNTER — Encounter: Payer: Self-pay | Admitting: Family Medicine

## 2020-01-09 ENCOUNTER — Ambulatory Visit (INDEPENDENT_AMBULATORY_CARE_PROVIDER_SITE_OTHER): Payer: Medicare Other | Admitting: Family Medicine

## 2020-01-09 ENCOUNTER — Other Ambulatory Visit: Payer: Self-pay

## 2020-01-09 VITALS — BP 136/68 | HR 88 | Temp 95.4°F | Ht 73.0 in | Wt 222.0 lb

## 2020-01-09 DIAGNOSIS — Z23 Encounter for immunization: Secondary | ICD-10-CM | POA: Diagnosis not present

## 2020-01-09 DIAGNOSIS — M545 Low back pain, unspecified: Secondary | ICD-10-CM

## 2020-01-09 DIAGNOSIS — Z0000 Encounter for general adult medical examination without abnormal findings: Secondary | ICD-10-CM

## 2020-01-09 DIAGNOSIS — E785 Hyperlipidemia, unspecified: Secondary | ICD-10-CM | POA: Diagnosis not present

## 2020-01-09 DIAGNOSIS — I10 Essential (primary) hypertension: Secondary | ICD-10-CM | POA: Diagnosis not present

## 2020-01-09 DIAGNOSIS — E1142 Type 2 diabetes mellitus with diabetic polyneuropathy: Secondary | ICD-10-CM | POA: Diagnosis not present

## 2020-01-09 DIAGNOSIS — Z7189 Other specified counseling: Secondary | ICD-10-CM

## 2020-01-09 MED ORDER — LEVEMIR FLEXTOUCH 100 UNIT/ML ~~LOC~~ SOPN: PEN_INJECTOR | SUBCUTANEOUS | Status: DC

## 2020-01-09 MED ORDER — ATORVASTATIN CALCIUM 80 MG PO TABS
80.0000 mg | ORAL_TABLET | Freq: Every day | ORAL | 1 refills | Status: DC
Start: 2020-01-09 — End: 2020-04-16

## 2020-01-09 NOTE — Patient Instructions (Addendum)
Stop the metformin and see if the GI troubles get better.  Call about a back injection.   In the meantime, if your AM sugar is above 150, then add 1 unit per day.    Update me in about 2 weeks about your insulin dose and sugar readings.   Follow up with the foot clinic and keep checking your feet.   Plan on recheck in about 3 months with A1c at the visit.   Take care.  Glad to see you.

## 2020-01-09 NOTE — Progress Notes (Signed)
This visit occurred during the SARS-CoV-2 public health emergency.  Safety protocols were in place, including screening questions prior to the visit, additional usage of staff PPE, and extensive cleaning of exam room while observing appropriate contact time as indicated for disinfecting solutions.  Flu d/w pt.   Shingles 2020 PNA up-to-date Tetanus 2016 covid vaccine 2021 Colonoscopy d/w pt.  No FH.  No recent screening.  See follow-up phone note.  I will ask for GI input. Prostate cancer screening declined.  Discussed with patient. Advance directive-wife and oldest daughter Ebony Hail equally designated if patient were incapacitated.  Diabetes:  Using medications without difficulties: GI upset with metformin.   Hypoglycemic episodes: no Hyperglycemic episodes: no Feet problems: yes on gabapentin at baseline.    See foot exam. Blood Sugars averaging: usually ~200.  No 300s.   eye exam within last year: yes Labs d/w pt. still taking insulin at baseline.  Discussed insulin titration.  He is still putting up with back pain and going for injection soon. He'll call about follow up.    Hypertension:    Using medication without problems or lightheadedness: yes Chest pain with exertion:no Edema:no Short of breath:no Labs d/w pt.    Elevated Cholesterol: Using medications without problems: yes Muscle aches: not from statin.  Diet compliance: encouraged.   Exercise: encouraged Labs d/w pt.    PMH and SH reviewed.   Vital signs, Meds and allergies reviewed.  ROS: Per HPI unless specifically indicated in ROS section   GEN: nad, alert and oriented HEENT: ncat NECK: supple w/o LA CV: rrr. PULM: ctab, no inc wob ABD: soft, +bs EXT: no edema SKIN: no acute rash  Diabetic foot exam: Normal inspection No skin breakdown Large callus noted on distal right metatarsal, shaved down without complication using #38 blade.  Tolerated well.  No ulceration. Normal DP pulses Decrease sensation  to light tough and monofilament Nails thickened

## 2020-01-10 ENCOUNTER — Telehealth: Payer: Self-pay | Admitting: Family Medicine

## 2020-01-10 DIAGNOSIS — Z1211 Encounter for screening for malignant neoplasm of colon: Secondary | ICD-10-CM

## 2020-01-10 DIAGNOSIS — Z0000 Encounter for general adult medical examination without abnormal findings: Secondary | ICD-10-CM | POA: Insufficient documentation

## 2020-01-10 NOTE — Assessment & Plan Note (Signed)
Continue Lipitor for now.  Continue work on diet.  He agrees.

## 2020-01-10 NOTE — Telephone Encounter (Signed)
This patient has an A1c of almost 10, we are working to address his diabetes. No family history of colon cancer but no recent colonoscopy. Given his age and medical situation is he still a candidate for colonoscopy? I appreciate your input.

## 2020-01-10 NOTE — Assessment & Plan Note (Addendum)
Callus shaved at office visit with foot clinic follow-up pending.  Stop Metformin to see if his GI symptoms improved.  Cautions given to patient regarding foot care. In the meantime, if AM sugar is above 150, then add 1 unit per day.    He will update me in about 2 weeks about his insulin dose and sugar readings.   Plan on recheck in about 3 months with A1c at the visit.

## 2020-01-10 NOTE — Assessment & Plan Note (Signed)
Advance directive-wife and oldest daughter Allison equally designated if patient were incapacitated. ?

## 2020-01-10 NOTE — Assessment & Plan Note (Signed)
He is still putting up with back pain and going for injection soon. He'll call about follow up.

## 2020-01-10 NOTE — Assessment & Plan Note (Signed)
Flu d/w pt.   Shingles 2020 PNA up-to-date Tetanus 2016 covid vaccine 2021 Colonoscopy d/w pt.  No FH.  No recent screening.  See follow-up phone note.  I will ask for GI input. Prostate cancer screening declined.  Discussed with patient. Advance directive-wife and oldest daughter Ebony Hail equally designated if patient were incapacitated.

## 2020-01-10 NOTE — Assessment & Plan Note (Signed)
Continue amlodipine.  Continue work on diet.  Labs discussed with patient.

## 2020-01-11 NOTE — Telephone Encounter (Signed)
Patient advised.

## 2020-01-11 NOTE — Telephone Encounter (Signed)
Appreciate GI input.  Please update patient.  See below.  I put in a referral to GI.  Thanks.

## 2020-01-11 NOTE — Telephone Encounter (Signed)
Christian [name], If his overall health is otherwise good, it has been greater than 10 years since prior colonoscopy (if any), and he is motivated, then it is quite reasonable to proceed with screening colonoscopy at age 79.  Thanks. Jenny Reichmann

## 2020-01-12 ENCOUNTER — Other Ambulatory Visit: Payer: Self-pay | Admitting: Orthopedic Surgery

## 2020-01-12 DIAGNOSIS — M48061 Spinal stenosis, lumbar region without neurogenic claudication: Secondary | ICD-10-CM

## 2020-01-16 ENCOUNTER — Ambulatory Visit
Admission: RE | Admit: 2020-01-16 | Discharge: 2020-01-16 | Disposition: A | Discharge status: Home / Self Care | Payer: Medicare Other | Source: Ambulatory Visit | Attending: Orthopedic Surgery | Admitting: Orthopedic Surgery

## 2020-01-16 ENCOUNTER — Other Ambulatory Visit: Payer: Self-pay

## 2020-01-16 DIAGNOSIS — M47817 Spondylosis without myelopathy or radiculopathy, lumbosacral region: Secondary | ICD-10-CM | POA: Diagnosis not present

## 2020-01-16 DIAGNOSIS — M48061 Spinal stenosis, lumbar region without neurogenic claudication: Secondary | ICD-10-CM

## 2020-01-16 MED ORDER — IOPAMIDOL (ISOVUE-M 200) INJECTION 41%
1.0000 mL | Freq: Once | INTRAMUSCULAR | Status: AC
  Administered 2020-01-16: 1 mL via EPIDURAL

## 2020-01-16 MED ORDER — METHYLPREDNISOLONE ACETATE 40 MG/ML INJ SUSP (RADIOLOG
120.0000 mg | Freq: Once | INTRAMUSCULAR | Status: AC
  Administered 2020-01-16: 120 mg via EPIDURAL

## 2020-01-16 NOTE — Discharge Instructions (Signed)

## 2020-01-19 ENCOUNTER — Other Ambulatory Visit: Payer: Self-pay

## 2020-01-19 ENCOUNTER — Ambulatory Visit (INDEPENDENT_AMBULATORY_CARE_PROVIDER_SITE_OTHER): Payer: Medicare Other | Admitting: Podiatry

## 2020-01-19 DIAGNOSIS — L84 Corns and callosities: Secondary | ICD-10-CM | POA: Diagnosis not present

## 2020-01-19 DIAGNOSIS — E1142 Type 2 diabetes mellitus with diabetic polyneuropathy: Secondary | ICD-10-CM

## 2020-01-19 DIAGNOSIS — B351 Tinea unguium: Secondary | ICD-10-CM

## 2020-01-19 DIAGNOSIS — E1169 Type 2 diabetes mellitus with other specified complication: Secondary | ICD-10-CM

## 2020-01-19 NOTE — Progress Notes (Signed)
  Subjective:  Patient ID: ROMY MCGUE, male    DOB: 08/12/1940,  MRN: [id number]  No chief complaint on file.  79 y.o. male presents with the above complaint. History confirmed with patient. Denies new issues, states the feet are doing ok.  Objective:  Physical Exam: warm, good capillary refill, nail exam onychomycosis of the toenails, no trophic changes or ulcerative lesions. DP pulses palpable, PT pulses palpable and protective sensation absent. HPKs submet 1 bilat, 5th right, hallux IPJ left. Left hallux ulcer healed.  Assessment:   1. Onychomycosis of multiple toenails with type 2 diabetes mellitus and peripheral neuropathy (HCC)   2. Callus   3. DM type 2 with diabetic peripheral neuropathy ([place])    Plan:  Patient was evaluated and treated and all questions answered.  Onychomycosis, Diabetes and DPN -Patient is diabetic with a qualifying condition for at risk foot care.  Procedure: Nail Debridement Rationale: Patient meets criteria for routine foot care due to DPN Type of Debridement: manual, sharp debridement. Instrumentation: Nail nipper, rotary burr. Number of Nails: 10   Procedure: Paring of Lesion Rationale: painful hyperkeratotic lesion Type of Debridement: manual, sharp debridement. Instrumentation: 312 blade Number of Lesions: 4    Return in about 3 months (around 04/20/2020) for Diabetic Foot Care.

## 2020-01-24 DIAGNOSIS — H2511 Age-related nuclear cataract, right eye: Secondary | ICD-10-CM | POA: Diagnosis not present

## 2020-01-24 DIAGNOSIS — H25811 Combined forms of age-related cataract, right eye: Secondary | ICD-10-CM | POA: Diagnosis not present

## 2020-02-14 ENCOUNTER — Encounter: Payer: Self-pay | Admitting: Gastroenterology

## 2020-02-14 ENCOUNTER — Other Ambulatory Visit: Payer: Self-pay | Admitting: Family Medicine

## 2020-02-15 ENCOUNTER — Telehealth: Payer: Self-pay | Admitting: Family Medicine

## 2020-02-15 NOTE — Chronic Care Management (AMB) (Signed)
  Chronic Care Management   Note  02/15/2020 Name: Christian [name] MRN: [id number] DOB: 04-04-1941  Christian [name] is a 79 y.o. year old male who is a primary care patient of Tonia Ghent, MD. I reached out to Christian [name] by phone today in response to a referral sent by Christian [name]'s PCP, Tonia Ghent, MD.   Christian [name] was given information about Chronic Care Management services today including:  1. CCM service includes personalized support from designated clinical staff supervised by his physician, including individualized plan of care and coordination with other care providers 2. 24/7 contact phone numbers for assistance for urgent and routine care needs. 3. Service will only be billed when office clinical staff spend 20 minutes or more in a month to coordinate care. 4. Only one practitioner may furnish and bill the service in a calendar month. 5. The patient may stop CCM services at any time (effective at the end of the month) by phone call to the office staff.   Patient agreed to services and verbal consent obtained.   Follow up plan:   [place]

## 2020-02-15 NOTE — Telephone Encounter (Signed)
Pharmacy requests refill on: glucose blood (one touch ultra) test strip   LAST REFILL: 03/23/2019 LAST OV: 01/09/2020 NEXT OV: 04/09/2020 PHARMACY: [facility] [address], [place]

## 2020-02-19 ENCOUNTER — Other Ambulatory Visit: Payer: Self-pay | Admitting: Family Medicine

## 2020-02-19 NOTE — Telephone Encounter (Signed)
Pharmacy requests refill on: Pantoprazole Sodium 40 mg  LAST REFILL: 11/20/2019 LAST OV: 01/09/2020 NEXT OV: 04/09/2020 PHARMACY: [facility] [address] [place]

## 2020-02-21 ENCOUNTER — Other Ambulatory Visit: Payer: Self-pay

## 2020-02-21 MED ORDER — ONETOUCH ULTRA VI STRP: ORAL_STRIP | 1 refills | Status: DC

## 2020-02-21 NOTE — Telephone Encounter (Signed)
[facility] left v/m requesting new rx for diabetic test strips with dx code. Done per protocol.

## 2020-02-23 ENCOUNTER — Other Ambulatory Visit: Payer: Self-pay

## 2020-02-23 ENCOUNTER — Ambulatory Visit (AMBULATORY_SURGERY_CENTER): Payer: Self-pay | Admitting: *Deleted

## 2020-02-23 VITALS — Ht 73.0 in | Wt 225.0 lb

## 2020-02-23 DIAGNOSIS — Z1211 Encounter for screening for malignant neoplasm of colon: Secondary | ICD-10-CM

## 2020-02-23 MED ORDER — SUTAB 1479-225-188 MG PO TABS: 24.0000 | ORAL_TABLET | ORAL | 0 refills | Status: DC

## 2020-02-23 NOTE — Progress Notes (Signed)
Cov vax x 2   No egg or soy allergy known to patient  No issues with past sedation with any surgeries or procedures no intubation problems in the past  No FH of Malignant Hyperthermia No diet pills per patient No home 02 use per patient  No blood thinners per patient  Pt denies issues with constipation  No A fib or A flutter  EMMI video to pt or via [place] 19 guidelines implemented in PV today with Pt and RN   [place]  Coupon given to pt in PV today , Code to Pharmacy   Due to the COVID-19 pandemic we are asking patients to follow these guidelines. Please only bring one care partner. Please be aware that your care partner may wait in the car in the parking lot or if they feel like they will be too hot to wait in the car, they may wait in the lobby on the 4th floor. All care partners are required to wear a mask the entire time (we do not have any that we can provide them), they need to practice social distancing, and we will do a Covid check for all patient's and care partners when you arrive. Also we will check their temperature and your temperature. If the care partner waits in their car they need to stay in the parking lot the entire time and we will call them on their cell phone when the patient is ready for discharge so they can bring the car to the front of the building. Also all patient's will need to wear a mask into building.

## 2020-03-05 ENCOUNTER — Telehealth: Payer: Self-pay | Admitting: Family Medicine

## 2020-03-05 ENCOUNTER — Ambulatory Visit: Payer: Medicare Other

## 2020-03-05 ENCOUNTER — Other Ambulatory Visit: Payer: Self-pay

## 2020-03-05 DIAGNOSIS — E1142 Type 2 diabetes mellitus with diabetic polyneuropathy: Secondary | ICD-10-CM

## 2020-03-05 DIAGNOSIS — I10 Essential (primary) hypertension: Secondary | ICD-10-CM
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
## 2020-03-05 NOTE — Patient Instructions (Addendum)
March 05, 2020  Dear Christian [name],  It was a pleasure meeting you during our initial appointment on March 05, 2020. Below is a summary of the goals we discussed and components of chronic care management. Please contact me anytime with questions or concerns.   Visit Information  Goals Addressed            This Visit's Progress   . Pharmacy Care Plan       CARE PLAN ENTRY (see longitudinal plan of care for additional care plan information)  Current Barriers:  . Chronic Disease Management support, education, and care coordination needs related to Hypertension and Diabetes   Hypertension BP Readings from Last 3 Encounters:  01/16/20 (!) 164/93  01/09/20 136/68  10/03/19 (!) 160/86   . Pharmacist Clinical Goal(s): o Over the next 2 months, patient will work with PharmD and providers to achieve BP goal <130/80 mmHg . Current regimen:  o Amlodipine 2.5 mg - 1 tablet daily . Interventions: o Consult with PCP to start ACE-inhibitor  . Patient self care activities - Over the next 2 months, patient will: o Check BP once daily for 2 weeks, document, and provide at future appointments o Ensure daily salt intake < 2300 mg/day  Diabetes Lab Results  Component Value Date/Time   HGBA1C 9.8 (H) 01/04/2020 08:02 AM   HGBA1C 8.6 (A) 09/25/2019 08:09 AM   HGBA1C 9.1 (A) 06/23/2019 08:30 AM   HGBA1C 10.5 (H) 10/25/2018 12:49 PM   . Pharmacist Clinical Goal(s): o Over the next 2 months, patient will work with PharmD and providers to achieve A1c goal <7% . Current regimen:   Levemir - Inject 30 units daily   Metformin 1000 mg - 1 tablet once daily   Glipizide 5 mg - 2 in the morning and 1 at night  . Interventions: o Reviewed home BG monitoring  . Patient self care activities - Over the next 2 months, patient will: o Check blood sugar once daily, document, and provide at future appointments o Contact provider with any episodes of hypoglycemia  Initial goal documentation        Christian [name] was given information about Chronic Care Management services today including:  1. CCM service includes personalized support from designated clinical staff supervised by his physician, including individualized plan of care and coordination with other care providers 2. 24/7 contact phone numbers for assistance for urgent and routine care needs. 3. Standard insurance, coinsurance, copays and deductibles apply for chronic care management only during months in which we provide at least 20 minutes of these services. Most insurances cover these services at 100%, however patients may be responsible for any copay, coinsurance and/or deductible if applicable. This service may help you avoid the need for more expensive face-to-face services. 4. Only one practitioner may furnish and bill the service in a calendar month. 5. The patient may stop CCM services at any time (effective at the end of the month) by phone call to the office staff.  Patient agreed to services and verbal consent obtained.   The patient verbalized understanding of instructions, educational materials, and care plan provided today and agreed to receive a mailed copy of patient instructions, educational materials, and care plan.  Telephone follow up appointment with pharmacy team member scheduled for:  04/23/19 at 2 PM   Debbora Dus, PharmD Clinical Pharmacist [facility] at [facility] [phone]   Diabetes Mellitus and Nutrition, Adult When you have diabetes (diabetes mellitus), it is very important to  have healthy eating habits because your blood sugar (glucose) levels are greatly affected by what you eat and drink. Eating healthy foods in the appropriate amounts, at about the same times every day, can help you:  Control your blood glucose.  Lower your risk of heart disease.  Improve your blood pressure.  Reach or maintain a healthy weight. Every person with diabetes is different, and each person  has different needs for a meal plan. Your health care provider may recommend that you work with a diet and nutrition specialist (dietitian) to make a meal plan that is best for you. Your meal plan may vary depending on factors such as:  The calories you need.  The medicines you take.  Your weight.  Your blood glucose, blood pressure, and cholesterol levels.  Your activity level.  Other health conditions you have, such as heart or kidney disease. How do carbohydrates affect me? Carbohydrates, also called carbs, affect your blood glucose level more than any other type of food. Eating carbs naturally raises the amount of glucose in your blood. Carb counting is a method for keeping track of how many carbs you eat. Counting carbs is important to keep your blood glucose at a healthy level, especially if you use insulin or take certain oral diabetes medicines. It is important to know how many carbs you can safely have in each meal. This is different for every person. Your dietitian can help you calculate how many carbs you should have at each meal and for each snack. Foods that contain carbs include:  Bread, cereal, rice, pasta, and crackers.  Potatoes and corn.  Peas, beans, and lentils.  Milk and yogurt.  Fruit and juice.  Desserts, such as cakes, cookies, ice cream, and candy. How does alcohol affect me? Alcohol can cause a sudden decrease in blood glucose (hypoglycemia), especially if you use insulin or take certain oral diabetes medicines. Hypoglycemia can be a life-threatening condition. Symptoms of hypoglycemia (sleepiness, dizziness, and confusion) are similar to symptoms of having too much alcohol. If your health care provider says that alcohol is safe for you, follow these guidelines:  Limit alcohol intake to no more than 1 drink per day for nonpregnant women and 2 drinks per day for men. One drink equals 12 oz of beer, 5 oz of wine, or 1 oz of hard liquor.  Do not drink on an  empty stomach.  Keep yourself hydrated with water, diet soda, or unsweetened iced tea.  Keep in mind that regular soda, juice, and other mixers may contain a lot of sugar and must be counted as carbs. What are tips for following this plan?  Reading food labels  Start by checking the serving size on the "Nutrition Facts" label of packaged foods and drinks. The amount of calories, carbs, fats, and other nutrients listed on the label is based on one serving of the item. Many items contain more than one serving per package.  Check the total grams (g) of carbs in one serving. You can calculate the number of servings of carbs in one serving by dividing the total carbs by 15. For example, if a food has 30 g of total carbs, it would be equal to 2 servings of carbs.  Check the number of grams (g) of saturated and trans fats in one serving. Choose foods that have low or no amount of these fats.  Check the number of milligrams (mg) of salt (sodium) in one serving. Most people should limit total sodium intake  to less than 2,300 mg per day.  Always check the nutrition information of foods labeled as "low-fat" or "nonfat". These foods may be higher in added sugar or refined carbs and should be avoided.  Talk to your dietitian to identify your daily goals for nutrients listed on the label. Shopping  Avoid buying canned, premade, or processed foods. These foods tend to be high in fat, sodium, and added sugar.  Shop around the outside edge of the grocery store. This includes fresh fruits and vegetables, bulk grains, fresh meats, and fresh dairy. Cooking  Use low-heat cooking methods, such as baking, instead of high-heat cooking methods like deep frying.  Cook using healthy oils, such as olive, canola, or sunflower oil.  Avoid cooking with butter, cream, or high-fat meats. Meal planning  Eat meals and snacks regularly, preferably at the same times every day. Avoid going long periods of time without  eating.  Eat foods high in fiber, such as fresh fruits, vegetables, beans, and whole grains. Talk to your dietitian about how many servings of carbs you can eat at each meal.  Eat 4-6 ounces (oz) of lean protein each day, such as lean meat, chicken, fish, eggs, or tofu. One oz of lean protein is equal to: ? 1 oz of meat, chicken, or fish. ? 1 egg. ?  cup of tofu.  Eat some foods each day that contain healthy fats, such as avocado, nuts, seeds, and fish. Lifestyle  Check your blood glucose regularly.  Exercise regularly as told by your health care provider. This may include: ? 150 minutes of moderate-intensity or vigorous-intensity exercise each week. This could be brisk walking, biking, or water aerobics. ? Stretching and doing strength exercises, such as yoga or weightlifting, at least 2 times a week.  Take medicines as told by your health care provider.  Do not use any products that contain nicotine or tobacco, such as cigarettes and e-cigarettes. If you need help quitting, ask your health care provider.  Work with a Social worker or diabetes educator to identify strategies to manage stress and any emotional and social challenges. Questions to ask a health care provider  Do I need to meet with a diabetes educator?  Do I need to meet with a dietitian?  What number can I call if I have questions?  When are the best times to check my blood glucose? Where to find more information:  American Diabetes Association: diabetes.org  Academy of Nutrition and Dietetics: www.eatright.[employer] of Diabetes and Digestive and Kidney Diseases (NIH): [employer] Summary  A healthy meal plan will help you control your blood glucose and maintain a healthy lifestyle.  Working with a diet and nutrition specialist (dietitian) can help you make a meal plan that is best for you.  Keep in mind that carbohydrates (carbs) and alcohol have immediate effects on your blood glucose  levels. It is important to count carbs and to use alcohol carefully. This information is not intended to replace advice given to you by your health care provider. Make sure you discuss any questions you have with your health care provider. Document Revised: 03/12/2017 Document Reviewed: 05/04/2016 Elsevier Patient Education  2020 [employer].

## 2020-03-05 NOTE — Progress Notes (Signed)
  Chronic Care Management   Outreach Note  03/05/2020 Name: Christian [name] MRN: [id number] DOB: 08-22-1940  Referred by: Tonia Ghent, MD Reason for referral : Chronic Care Management   An unsuccessful telephone outreach was attempted today. The patient was referred to the pharmacist for assistance with care management and care coordination.   Follow Up Plan:   Hilario Quarry  Upstream Scheduler

## 2020-03-06 ENCOUNTER — Ambulatory Visit: Payer: Self-pay

## 2020-03-06 ENCOUNTER — Other Ambulatory Visit: Payer: Self-pay | Admitting: Family Medicine

## 2020-03-06 DIAGNOSIS — I10 Essential (primary) hypertension: Secondary | ICD-10-CM

## 2020-03-06 MED ORDER — LISINOPRIL 5 MG PO TABS: 5.0000 mg | ORAL_TABLET | Freq: Every day | ORAL | 3 refills | Status: DC

## 2020-03-06 NOTE — Progress Notes (Signed)
Called and spoke with patient and gave him instructions per Dr. [name]. Patient verbalized understanding. Lab appointment scheduled for 03/20/2020 at [facility].

## 2020-03-06 NOTE — Chronic Care Management (AMB) (Signed)
PCP consult regarding CCM visit 03/05/20: We discussed starting a low dose ACE inhibitor considering his diabetes/GFR. He does not have microalbuminuria currently. He is agreeable to start lisinopril pending your approval. We discussed need for labs in 10-14 days. He will begin monitoring home BP daily. His BP was elevated at last clinic visit (164/93). He is also on amlodipine 2.5 mg.. If you agree, please send lisinopril to [facility] and order labs. Thanks!  Hypertension   CMP Latest Ref Rng & Units 01/04/2020 10/25/2018 03/01/2018  Glucose 70 - 99 mg/dL 196(H) 368(H) 137(H)  BUN 6 - 23 mg/dL 15 16 18   Creatinine 0.40 - 1.50 mg/dL 1.27 1.19 1.12  Sodium 135 - 145 mEq/L 139 137 142  Potassium 3.5 - 5.1 mEq/L 4.2 4.5 4.3  Chloride 96 - 112 mEq/L 101 101 102  CO2 19 - 32 mEq/L 24 28 29   Calcium 8.4 - 10.5 mg/dL 9.0 9.1 9.2  Total Protein 6.0 - 8.3 g/dL 7.3 - 7.3  Total Bilirubin 0.2 - 1.2 mg/dL 0.9 - 0.8  Alkaline Phos 39 - 117 U/L 64 - 74  AST 0 - 37 U/L 26 - 29  ALT 0 - 53 U/L 19 - 23   Lab Results  Component Value Date   CREATININE 1.27 01/04/2020   BUN 15 01/04/2020   GFR 54.78 (L) 01/04/2020   GFRNONAA >60 05/17/2016   GFRAA >60 05/17/2016   NA 139 01/04/2020   K 4.2 01/04/2020   CALCIUM 9.0 01/04/2020   CO2 24 01/04/2020   Office blood pressures are: BP Readings from Last 3 Encounters:  01/16/20 (!) 164/93  01/09/20 136/68  10/03/19 (!) 160/86   Allergies  Allergen Reactions  . Metformin And Related     Possible GI upset   Debbora Dus, PharmD Clinical Pharmacist [facility] at [facility] [phone]

## 2020-03-06 NOTE — Progress Notes (Signed)
rx sent for lisinopril 5mg  a day.  Please set up lab visit in about 2 weeks.  I put in the lab order for BMET.  Doesn't need to fast for labs.  Update me if not tolerated or if BP persistently >140/>90 after 2 weeks of taking med.  Thanks.

## 2020-03-12 ENCOUNTER — Other Ambulatory Visit: Payer: Self-pay

## 2020-03-12 ENCOUNTER — Ambulatory Visit (AMBULATORY_SURGERY_CENTER): Payer: Medicare Other | Admitting: Gastroenterology

## 2020-03-12 ENCOUNTER — Encounter: Payer: Self-pay | Admitting: Gastroenterology

## 2020-03-12 VITALS — BP 142/82 | HR 77 | Temp 97.8°F | Resp 17 | Ht 73.0 in | Wt 225.0 lb

## 2020-03-12 DIAGNOSIS — K635 Polyp of colon: Secondary | ICD-10-CM

## 2020-03-12 DIAGNOSIS — Z1211 Encounter for screening for malignant neoplasm of colon: Secondary | ICD-10-CM

## 2020-03-12 DIAGNOSIS — D124 Benign neoplasm of descending colon: Secondary | ICD-10-CM | POA: Diagnosis not present

## 2020-03-12 MED ORDER — SODIUM CHLORIDE 0.9 % IV SOLN: 500.0000 mL | Freq: Once | INTRAVENOUS | Status: DC

## 2020-03-12 NOTE — Progress Notes (Signed)
VS-CW  Pt's states no medical or surgical changes since previsit or office visit.  

## 2020-03-12 NOTE — Progress Notes (Signed)
pt tolerated well. VSS. awake and to recovery. Report given to RN.  

## 2020-03-12 NOTE — Progress Notes (Signed)
Called to room to assist during endoscopic procedure.  Patient ID and intended procedure confirmed with present staff. Received instructions for my participation in the procedure from the performing physician.  

## 2020-03-12 NOTE — Op Note (Signed)
[facility] Patient Name: Christian [name] Procedure Date: 03/12/2020 10:48 AM MRN: [id number] Endoscopist: Mauri Pole , MD Age: 79 Referring MD:  Date of Birth: 1940-11-05 Gender: Male Account #: [id number] Procedure:                Colonoscopy Indications:              Screening for colorectal malignant neoplasm Medicines:                Monitored Anesthesia Care Procedure:                Pre-Anesthesia Assessment:                           - Prior to the procedure, a History and Physical                            was performed, and patient medications and                            allergies were reviewed. The patient's tolerance of                            previous anesthesia was also reviewed. The risks                            and benefits of the procedure and the sedation                            options and risks were discussed with the patient.                            All questions were answered, and informed consent                            was obtained. Prior Anticoagulants: The patient has                            taken no previous anticoagulant or antiplatelet                            agents. ASA Grade Assessment: III - A patient with                            severe systemic disease. After reviewing the risks                            and benefits, the patient was deemed in                            satisfactory condition to undergo the procedure.                           After obtaining informed consent, the colonoscope  was passed under direct vision. Throughout the                            procedure, the patient's blood pressure, pulse, and                            oxygen saturations were monitored continuously. The                            Colonoscope was introduced through the anus and                            advanced to the the cecum, identified by                            appendiceal orifice  and ileocecal valve. The                            colonoscopy was performed without difficulty. The                            patient tolerated the procedure well. The quality                            of the bowel preparation was good. The ileocecal                            valve, appendiceal orifice, and rectum were                            photographed. Scope In: 11:00:11 AM Scope Out: 11:15:03 AM Scope Withdrawal Time: 0 hours 11 minutes 22 seconds  Total Procedure Duration: 0 hours 14 minutes 52 seconds  Findings:                 The perianal and digital rectal examinations were                            normal.                           Three semi-pedunculated polyps were found in the                            sigmoid colon and descending colon. The polyps were                            5 to 11 mm in size. These polyps were removed with                            a hot snare. Resection and retrieval were complete.                            To prevent bleeding after the polypectomy in the  sigmoid colon, one hemostatic clip was successfully                            placed (MR conditional). There was no bleeding at                            the end of the procedure.                           Scattered small and large-mouthed diverticula were                            found in the sigmoid colon, descending colon,                            transverse colon and ascending colon.                           Non-bleeding external and internal hemorrhoids were                            found during retroflexion. The hemorrhoids were                            large.                           The exam was otherwise without abnormality. Complications:            No immediate complications. Estimated Blood Loss:     Estimated blood loss was minimal. Impression:               - Three 5 to 11 mm polyps in the sigmoid colon and                             in the descending colon, removed with a hot snare.                            Resected and retrieved. Clip (MR conditional) was                            placed.                           - Moderate diverticulosis in the sigmoid colon, in                            the descending colon, in the transverse colon and                            in the ascending colon.                           - Non-bleeding external and internal hemorrhoids.                           -  The examination was otherwise normal. Recommendation:           - Patient has a contact number available for                            emergencies. The signs and symptoms of potential                            delayed complications were discussed with the                            patient. Return to normal activities tomorrow.                            Written discharge instructions were provided to the                            patient.                           - Resume previous diet.                           - Continue present medications.                           - Await pathology results.                           - No repeat colonoscopy due to age. Mauri Pole, MD 03/12/2020 11:20:10 AM This report has been signed electronically.

## 2020-03-12 NOTE — Patient Instructions (Signed)
Thank you for letting us take care of your healthcare needs today. Please see handouts given to you on polyps. Diverticulosis and Hemorrhoids.    YOU HAD AN ENDOSCOPIC PROCEDURE TODAY AT [facility]:   Refer to the procedure report that was given to you for any specific questions about what was found during the examination.  If the procedure report does not answer your questions, please call your gastroenterologist to clarify.  If you requested that your care partner not be given the details of your procedure findings, then the procedure report has been included in a sealed envelope for you to review at your convenience later.  YOU SHOULD EXPECT: Some feelings of bloating in the abdomen. Passage of more gas than usual.  Walking can help get rid of the air that was put into your GI tract during the procedure and reduce the bloating. If you had a lower endoscopy (such as a colonoscopy or flexible sigmoidoscopy) you may notice spotting of blood in your stool or on the toilet paper. If you underwent a bowel prep for your procedure, you may not have a normal bowel movement for a few days.  Please Note:  You might notice some irritation and congestion in your nose or some drainage.  This is from the oxygen used during your procedure.  There is no need for concern and it should clear up in a day or so.  SYMPTOMS TO REPORT IMMEDIATELY:   Following lower endoscopy (colonoscopy or flexible sigmoidoscopy):  Excessive amounts of blood in the stool  Significant tenderness or worsening of abdominal pains  Swelling of the abdomen that is new, acute  Fever of 100F or higher    For urgent or emergent issues, a gastroenterologist can be reached at any hour by calling [phone]. Do not use MyChart messaging for urgent concerns.    DIET:  We do recommend a small meal at first, but then you may proceed to your regular diet.  Drink plenty of fluids but you should avoid alcoholic beverages  for 24 hours.  ACTIVITY:  You should plan to take it easy for the rest of today and you should NOT DRIVE or use heavy machinery until tomorrow (because of the sedation medicines used during the test).    FOLLOW UP: Our staff will call the number listed on your records 48-72 hours following your procedure to check on you and address any questions or concerns that you may have regarding the information given to you following your procedure. If we do not reach you, we will leave a message.  We will attempt to reach you two times.  During this call, we will ask if you have developed any symptoms of COVID 19. If you develop any symptoms (ie: fever, flu-like symptoms, shortness of breath, cough etc.) before then, please call [phone].  If you test positive for Covid 19 in the 2 weeks post procedure, please call and report this information to [place].    If any biopsies were taken you will be contacted by phone or by letter within the next 1-3 weeks.  Please call us at [phone] if you have not heard about the biopsies in 3 weeks.    SIGNATURES/CONFIDENTIALITY: You and/or your care partner have signed paperwork which will be entered into your electronic medical record.  These signatures attest to the fact that that the information above on your After Visit Summary has been reviewed and is understood.  Full responsibility of the confidentiality of  this discharge information lies with you and/or your care-partner.

## 2020-03-14 ENCOUNTER — Telehealth: Payer: Self-pay | Admitting: *Deleted

## 2020-03-14 ENCOUNTER — Ambulatory Visit: Payer: Self-pay

## 2020-03-14 DIAGNOSIS — E1142 Type 2 diabetes mellitus with diabetic polyneuropathy: Secondary | ICD-10-CM

## 2020-03-14 DIAGNOSIS — I10 Essential (primary) hypertension: Secondary | ICD-10-CM

## 2020-03-14 NOTE — Telephone Encounter (Signed)
First f/u call attempt.  LVM

## 2020-03-14 NOTE — Chronic Care Management (AMB) (Signed)
Patient called with questions about new prescription for lisinopril 5 mg daily. We reviewed indication, administration, and monitoring. He will begin taking it today in the morning. Lab currently scheduled on 03/20/20, will have taken 7 doses prior to lab.   Debbora Dus, PharmD Clinical Pharmacist [facility] at [facility] [phone]

## 2020-03-14 NOTE — Telephone Encounter (Signed)
Pt called back and is doing well after procedure

## 2020-03-14 NOTE — Telephone Encounter (Signed)
  Follow up Call-  Call back number 03/12/2020  Post procedure Call Back phone  # [phone]  Permission to leave phone message Yes  Some recent data might be hidden     Patient questions:  Do you have a fever, pain , or abdominal swelling? No. Pain Score  0 *  Have you tolerated food without any problems? Yes.    Have you been able to return to your normal activities? Yes.    Do you have any questions about your discharge instructions: Diet   No. Medications  No. Follow up visit  No.   Do you have questions or concerns about your Care? No.  Actions: * If pain score is 4 or above: No action needed, pain <4.  1. Have you developed a fever since your procedure? no  2.   Have you had an respiratory symptoms (SOB or cough) since your procedure? no  3.   Have you tested positive for COVID 19 since your procedure no  4.   Have you had any family members/close contacts diagnosed with the COVID 19 since your procedure?  no   If yes to any of these questions please route to Joylene John, RN and Joella Prince, RN

## 2020-03-20 ENCOUNTER — Other Ambulatory Visit (INDEPENDENT_AMBULATORY_CARE_PROVIDER_SITE_OTHER): Payer: Medicare Other

## 2020-03-20 ENCOUNTER — Other Ambulatory Visit: Payer: Self-pay

## 2020-03-20 DIAGNOSIS — I10 Essential (primary) hypertension: Secondary | ICD-10-CM | POA: Diagnosis not present

## 2020-03-20 LAB — BASIC METABOLIC PANEL
BUN: 11 mg/dL (ref 6–23)
CO2: 27 mEq/L (ref 19–32)
Calcium: 9.1 mg/dL (ref 8.4–10.5)
Chloride: 103 mEq/L (ref 96–112)
Creatinine, Ser: 1.16 mg/dL (ref 0.40–1.50)
GFR: 60.25 mL/min (ref >60.00)
Glucose, Bld: 228 mg/dL — ABNORMAL HIGH (ref 70–99)
Potassium: 4.4 mEq/L (ref 3.5–5.1)
Sodium: 142 mEq/L (ref 135–145)

## 2020-03-25 ENCOUNTER — Encounter: Payer: Self-pay | Admitting: Gastroenterology

## 2020-04-01 ENCOUNTER — Other Ambulatory Visit: Payer: Self-pay | Admitting: Orthopedic Surgery

## 2020-04-01 DIAGNOSIS — M1611 Unilateral primary osteoarthritis, right hip: Secondary | ICD-10-CM | POA: Diagnosis not present

## 2020-04-01 DIAGNOSIS — M25551 Pain in right hip: Secondary | ICD-10-CM

## 2020-04-01 DIAGNOSIS — R269 Unspecified abnormalities of gait and mobility: Secondary | ICD-10-CM | POA: Diagnosis not present

## 2020-04-04 ENCOUNTER — Ambulatory Visit
Admission: RE | Admit: 2020-04-04 | Discharge: 2020-04-04 | Disposition: A | Discharge status: Home / Self Care | Payer: Medicare Other | Source: Ambulatory Visit | Attending: Orthopedic Surgery | Admitting: Orthopedic Surgery

## 2020-04-04 ENCOUNTER — Other Ambulatory Visit: Payer: Self-pay

## 2020-04-04 DIAGNOSIS — M25551 Pain in right hip: Secondary | ICD-10-CM | POA: Diagnosis not present

## 2020-04-04 MED ORDER — IOPAMIDOL (ISOVUE-M 200) INJECTION 41%
1.0000 mL | Freq: Once | INTRAMUSCULAR | Status: AC
  Administered 2020-04-04: 1 mL via INTRA_ARTICULAR

## 2020-04-04 MED ORDER — METHYLPREDNISOLONE ACETATE 40 MG/ML INJ SUSP (RADIOLOG
80.0000 mg | Freq: Once | INTRAMUSCULAR | Status: AC
  Administered 2020-04-04: 80 mg via INTRA_ARTICULAR

## 2020-04-07 ENCOUNTER — Other Ambulatory Visit: Payer: Self-pay | Admitting: Family Medicine

## 2020-04-08 NOTE — Telephone Encounter (Signed)
Please Advise

## 2020-04-08 NOTE — Telephone Encounter (Signed)
Sent. Thanks.   

## 2020-04-09 ENCOUNTER — Ambulatory Visit: Payer: Medicare Other | Admitting: Family Medicine

## 2020-04-10 ENCOUNTER — Other Ambulatory Visit: Payer: Self-pay | Admitting: Family Medicine

## 2020-04-16 ENCOUNTER — Encounter: Payer: Self-pay | Admitting: Family Medicine

## 2020-04-16 ENCOUNTER — Ambulatory Visit (INDEPENDENT_AMBULATORY_CARE_PROVIDER_SITE_OTHER): Payer: Medicare Other | Admitting: Family Medicine

## 2020-04-16 ENCOUNTER — Other Ambulatory Visit: Payer: Self-pay

## 2020-04-16 VITALS — BP 162/84 | HR 97 | Temp 95.8°F | Ht 74.0 in | Wt 218.2 lb

## 2020-04-16 DIAGNOSIS — E1142 Type 2 diabetes mellitus with diabetic polyneuropathy: Secondary | ICD-10-CM | POA: Diagnosis not present

## 2020-04-16 DIAGNOSIS — I10 Essential (primary) hypertension: Secondary | ICD-10-CM

## 2020-04-16 LAB — POCT GLYCOSYLATED HEMOGLOBIN (HGB A1C): Hemoglobin A1C: 9.5 % — AB (ref 4.0–5.6)

## 2020-04-16 MED ORDER — LISINOPRIL 5 MG PO TABS: 7.5000 mg | ORAL_TABLET | Freq: Every day | ORAL | 3 refills | Status: DC

## 2020-04-16 MED ORDER — LISINOPRIL 5 MG PO TABS
7.5000 mg | ORAL_TABLET | Freq: Every day | ORAL | Status: DC
Start: 2020-04-16 — End: 2020-04-16

## 2020-04-16 MED ORDER — ATORVASTATIN CALCIUM 80 MG PO TABS: 80.0000 mg | ORAL_TABLET | Freq: Every day | ORAL | 3 refills | Status: DC

## 2020-04-16 MED ORDER — GLIPIZIDE 5 MG PO TABS: ORAL_TABLET | ORAL | 3 refills | Status: DC

## 2020-04-16 MED ORDER — AMLODIPINE BESYLATE 2.5 MG PO TABS: 2.5000 mg | ORAL_TABLET | Freq: Every day | ORAL | 3 refills | Status: DC

## 2020-04-16 MED ORDER — METFORMIN HCL 1000 MG PO TABS: 500.0000 mg | ORAL_TABLET | Freq: Every day | ORAL | Status: DC

## 2020-04-16 MED ORDER — LEVEMIR FLEXTOUCH 100 UNIT/ML ~~LOC~~ SOPN: PEN_INJECTOR | SUBCUTANEOUS | 3 refills | Status: DC

## 2020-04-16 NOTE — Patient Instructions (Addendum)
Try increasing lisinopril to 1.5 tabs for about 1 week.  If BP is still above 140/90, then increase to 2 tabs a day.  Update me as needed.  Keep adding 1 unit of insulin if your AM sugar is above 150.   Plan on recheck in about 3 months.  We can do labs at the visit.  Take care.  Glad to see you. Use warm compresses on your lower eyelid.

## 2020-04-16 NOTE — Progress Notes (Signed)
This visit occurred during the SARS-CoV-2 public health emergency.  Safety protocols were in place, including screening questions prior to the visit, additional usage of staff PPE, and extensive cleaning of exam room while observing appropriate contact time as indicated for disinfecting solutions.  Diabetes:  Using medications without difficulties: yes, see below Hypoglycemic episodes:no Hyperglycemic episodes: no Feet problems: dec in sensation at baseline but not absent.   Blood Sugars averaging: 100-200 AM sugar unless accidentally missed PM shot of insulin the prior night.  Usually taking 30 units eye exam within last year: yes A1c done at OV.  D/w pt.   He tried coming off metformin and was able to restart/tolerate 500mg  a day.    Hypertension.  No chest pain.  BP has been elevated at home.  Taking lisinopril 5mg  a day.  Compliant with medications.  Meds, vitals, and allergies reviewed.  ROS: Per HPI unless specifically indicated in ROS section   GEN: nad, alert and oriented HEENT: ncat, stye L lower eye lid (discussed using warm compress and update me/eye clinic if needed) NECK: supple w/o LA CV: rrr. PULM: ctab, no inc wob ABD: soft, +bs EXT: no edema SKIN: no acute rash   32 minutes were devoted to patient care in this encounter (this includes time spent reviewing the patient's file/history, interviewing and examining the patient, counseling/reviewing plan with patient).

## 2020-04-18 NOTE — Assessment & Plan Note (Signed)
Try increasing lisinopril to 1.5 tabs for about 1 week.  If BP is still above 140/90, then increase to 2 tabs a day.  Update me as needed.  He agrees to plan.

## 2020-04-18 NOTE — Assessment & Plan Note (Signed)
Exercise is limited.  Continue work on diet. Keep adding 1 unit of insulin if AM sugar is above 150.   Plan on recheck in about 3 months.  We can do labs at the visit.  He agrees with plan.

## 2020-04-22 ENCOUNTER — Other Ambulatory Visit: Payer: Self-pay

## 2020-04-22 ENCOUNTER — Telehealth: Payer: Self-pay

## 2020-04-22 ENCOUNTER — Ambulatory Visit: Payer: Medicare Other

## 2020-04-22 DIAGNOSIS — I10 Essential (primary) hypertension: Secondary | ICD-10-CM

## 2020-04-22 DIAGNOSIS — E1142 Type 2 diabetes mellitus with diabetic polyneuropathy: Secondary | ICD-10-CM

## 2020-04-22 MED ORDER — LISINOPRIL 5 MG PO TABS: 10.0000 mg | ORAL_TABLET | Freq: Every day | ORAL | Status: DC

## 2020-04-22 NOTE — Chronic Care Management (AMB) (Signed)
Chronic Care Management Pharmacy  Name: Christian [name]  MRN: [id number] DOB: 1940-12-13  Chief Complaint/ HPI  Christian [name],  80 y.o. , male presents for their Follow-Up CCM visit with the clinical pharmacist via telephone.  PCP : Tonia Ghent, MD  Their chronic conditions include: HTN, DM with neuropathy, HLD  Office Visits:  04/16/20: [name] - Try increasing lisinopril to 1.5 tabs for about 1 week.  If BP is still above 140/90, then increase to 2 tabs a day.  Update me as needed. Keep adding 1 unit of insulin if your AM sugar is above 150  01/09/20: [name] - Callus shaved at office visit with foot clinic follow-up pending. Stop Metformin to see if his GI symptoms improved. In the meantime, if AM sugar is above 150, then add 1 unit per day.  He will update me in about 2 weeks about his insulin dose and sugar readings. Plan on recheck in about 3 months with A1c at the visit.   09/25/19: [name] - A1c better at 8.6.  This is still improving compared to prev. Okay to gradually increase insulin by 1-2 units at a time if needed.   Consult Visit: none in past 6 months  Medications: Outpatient Encounter Medications as of 04/22/2020  Medication Sig  . amLODipine (NORVASC) 2.5 MG tablet Take 1 tablet (2.5 mg total) by mouth daily.  Marland Kitchen atorvastatin (LIPITOR) 80 MG tablet Take 1 tablet (80 mg total) by mouth at bedtime.  . gabapentin (NEURONTIN) 300 MG capsule Take one capsule by mouth every morning and 2 capsules by mouth at bedtime.  Marland Kitchen glipiZIDE (GLUCOTROL) 5 MG tablet Take 2 tablets by mouth in the morning and 1 tablet by mouth at night.  Marland Kitchen glucose blood (ONETOUCH ULTRA) test strip USE 1 EACH  STRIP TO CHECK GLUCOSE ONCE DAILY;  DX E11.42  . insulin detemir (LEVEMIR FLEXTOUCH) 100 UNIT/ML FlexPen INJECT UP TO 20-35 UNITS SUBCUTANEOUSLY ONCE DAILY  . Insulin Pen Needle (RELION PEN NEEDLE 31G/8MM) 31G X 8 MM MISC USE TO INJECT LEVEMIR DAILY  . metFORMIN (GLUCOPHAGE) 1000 MG tablet Take 0.5  tablets (500 mg total) by mouth daily.  . pantoprazole (PROTONIX) 40 MG tablet Take 1 tablet by mouth twice daily  . vitamin C (ASCORBIC ACID) 500 MG tablet Take 500 mg by mouth daily.  . [DISCONTINUED] lisinopril (ZESTRIL) 5 MG tablet Take 1.5-2 tablets (7.5-10 mg total) by mouth daily.   No facility-administered encounter medications on file as of 04/22/2020.    Current Diagnosis/Assessment:   Goals Addressed            This Visit's Progress   . Pharmacy Care Plan       CARE PLAN ENTRY (see longitudinal plan of care for additional care plan information)  Current Barriers:  . Chronic Disease Management support, education, and care coordination needs related to Hypertension and Diabetes   Hypertension BP Readings from Last 3 Encounters:  04/16/20 (!) 162/84  03/12/20 (!) 142/82  01/16/20 (!) 164/93   . Pharmacist Clinical Goal(s): o Over the next 2 months, patient will work with PharmD and providers to achieve BP goal <130/80 mmHg . Current regimen:  o Amlodipine 2.5 mg - 1 tablet daily o Lisinopril 5 mg - 2 tablets daily . Interventions: o Increase lisinopril to 3 tablets (15 mg daily) per PCP consult o Continue to monitor home BP . Patient self care activities - Over the next 2 months, patient will: o Check BP once daily  for 2 weeks, document, and provide at future appointments o Increase lisinopril to 15 mg daily  o Ensure daily salt intake < 2300 mg/day  Diabetes Lab Results  Component Value Date/Time   HGBA1C 9.5 (A) 04/16/2020 09:27 AM   HGBA1C 9.8 (H) 01/04/2020 08:02 AM   HGBA1C 8.6 (A) 09/25/2019 08:09 AM   HGBA1C 10.5 (H) 10/25/2018 12:49 PM   . Pharmacist Clinical Goal(s): o Over the next 2 months, patient will work with PharmD and providers to achieve A1c goal <7% . Current regimen:   Levemir - Inject 32 units daily   Metformin 1000 mg - 1/2 tablet BID   Glipizide 5 mg - 2 in the morning and 1 at night  . Interventions: o Reviewed home BG  monitoring - trending in right direction with insulin dose increase . Patient self care activities - Over the next 2 months, patient will: o Check blood sugar once daily, document, and provide at future appointments o Contact provider with any episodes of hypoglycemia  Please see past updates related to this goal by clicking on the "Past Updates" button in the selected goal        Diabetes   Recent Relevant Labs: Lab Results  Component Value Date/Time   HGBA1C 9.5 (A) 04/16/2020 09:27 AM   HGBA1C 9.8 (H) 01/04/2020 08:02 AM   HGBA1C 8.6 (A) 09/25/2019 08:09 AM   HGBA1C 10.5 (H) 10/25/2018 12:49 PM   MICROALBUR 1.6 01/04/2020 08:02 AM   MICROALBUR 0.7 03/07/2018 09:41 AM    Lab Results  Component Value Date   CREATININE 1.16 03/20/2020   BUN 11 03/20/2020   GFR 60.25 03/20/2020   GFRNONAA >60 05/17/2016   GFRAA >60 05/17/2016   NA 142 03/20/2020   K 4.4 03/20/2020   CALCIUM 9.1 03/20/2020   CO2 27 03/20/2020   A1c goal < 7% Patient has failed these meds in past: metformin 1000 mg BID (intolerable GI symptoms) Patient is currently uncontrolled on the following medications:   Levemir - Inject 32 units daily (increased 04/16/20)  Metformin 1000 mg - 1/2 tablet AM and 1/2 tablet PM   Glipizide 5 mg - 2 in the morning and 1 at night   Lab Results  Component Value Date/Time   HMDIABEYEEXA No Retinopathy 11/21/2019 12:00 AM   Update 04/22/20: Checking BG once daily. Last 5 days FASTING --> 170, 194, 218, 184, 150, 127. Pt increased his Levemir to 32 units after PCP visit last week 04/16/20 as instructed. BG today within goal.  Reports eating more starches lately (french fries) and BG fluctuating with food choices. Discussed limiting starches.  Pt confirms he is taking metformin 1/2 tablet BID - will need to update most recent rx. Recommend limiting starches and continue current therapy. Follow up call to review BG in 2 weeks to make sure its still trending in right direction.    Plan: Continue current medications; Ask PCP to update current metformin prescription. Follow up call to review BG in 2 weeks to make sure home BG still trending in right direction.   Hyperlipidemia   LDL goal < 100  Last lipids Lab Results  Component Value Date   CHOL 85 01/04/2020   HDL 27.70 (L) 01/04/2020   LDLDIRECT 19.0 01/04/2020   TRIG 391.0 (H) 01/04/2020   CHOLHDL 3 01/04/2020   Hepatic Function Latest Ref Rng & Units 01/04/2020 03/01/2018 11/02/2016  Total Protein 6.0 - 8.3 g/dL 7.3 7.3 7.0  Albumin 3.5 - 5.2 g/dL 4.2 4.3 4.3  AST 0 - 37 U/L _0 ALT 0 - 53 U/L _1 Alk Phosphatase 39 - 117 U/L 64 74 79  Total Bilirubin 0.2 - 1.2 mg/dL 0.9 0.8 0.6    The ASCVD Risk score (Mulberry., et al., 2013) failed to calculate for the following reasons:   The valid total cholesterol range is 130 to 320 mg/dL   Patient has failed these meds in past: none reported Patient is currently controlled on the following medications:  . Atorvastatin 80 mg - 1 daily  Refills timely, LDL within goal   No updates/changes 04/22/20  Plan: Continue current medications  Hypertension   CMP Latest Ref Rng & Units 03/20/2020 01/04/2020 10/25/2018  Glucose 70 - 99 mg/dL 228(H) 196(H) 368(H)  BUN 6 - 23 mg/dL _2 Creatinine 0.40 - 1.50 mg/dL 1.16 1.27 1.19  Sodium 135 - 145 mEq/L 142 139 137  Potassium 3.5 - 5.1 mEq/L 4.4 4.2 4.5  Chloride 96 - 112 mEq/L 103 101 101  CO2 19 - 32 mEq/L _3 Calcium 8.4 - 10.5 mg/dL 9.1 9.0 9.1  Total Protein 6.0 - 8.3 g/dL - 7.3 -  Total Bilirubin 0.2 - 1.2 mg/dL - 0.9 -  Alkaline Phos 39 - 117 U/L - 64 -  AST 0 - 37 U/L - 26 -  ALT 0 - 53 U/L - 19 -   Office blood pressures are: BP Readings from Last 3 Encounters:  04/16/20 (!) 162/84  03/12/20 (!) 142/82  01/16/20 (!) 164/93   Patient has failed these meds in the past: none reported Patient checks BP at home daily  Patient is currently uncontrolled on the following  medications:   Amlodipine 2.5 mg - 1 daily   Lisinopril 5 mg - 2 tablets (10 mg) daily  Update 04/22/20:  At last visit 03/14/2020 we started lisinopril 5 mg daily. Pt reports he saw Dr. [name] 04/16/20 and was told to increase to 2 tablets lisinopril. Pt increased on 04/17/20. Checking BP daily with arm monitor. BP was 190/106, 76 this morning. Yesterday 158/95. Denies any less than 150s in last week. Last week - 185/98, 195/109, 193/100, 172/101. Reminded him to rest 5 minutes prior to check, uncross legs, and check BP before coffee, not after.   Plan: Continue current medications; Consult PCP - Recommend additional dose increase of lisinopril to 15 mg daily.   Medication Management   Patient's preferred pharmacy is:  [place], [place] - [address] [place] [address] Phone: [phone] Fax: [phone]  We discussed: Discussed benefits of medication synchronization, packaging and delivery as well as enhanced pharmacist oversight with [employer]. Patient would like to continue current medication management.  Plan: Continue current medication management strategy  Additional medications:   Pantoprazole 40 mg - 1 BID  Gabapentin 300 mg - 1 every morning  Follow up: 3 months (telephone); CMA to follow up with patient in 1 month   Debbora Dus, PharmD Clinical Pharmacist [facility] at [facility] [phone]

## 2020-04-22 NOTE — Telephone Encounter (Signed)
I put in the order for follow-up bmet.  Will you please have the patient increase his lisinopril to 15 mg a day if his blood pressure is persistently above 140/90?  Thank you for your help.

## 2020-04-22 NOTE — Telephone Encounter (Signed)
PCP consult regarding CCM 04/22/2020:  Blood pressure review - Pt is doing well on lisinopril. No side effects reported. Patient started lisinopril 5 mg daily 03/14/20. Increased to 10 mg daily on 04/17/20. Minimal improvement in BP so far.   Patient is currently uncontrolled on the following medications:   Amlodipine 2.5 mg - 1 daily (dose limited due to edema)  Lisinopril 5 mg - 2 tablets daily (10 mg)  Pt is checking BP daily with arm monitor. BP was 190/106, 76 this morning. Yesterday 158/95. Previous 5 days - 185/98, 195/109, 193/100, 172/101. Reminded him to rest 5 minutes prior to check, uncross legs, and check BP before coffee, not after. He is checking during his morning coffee currently with legs crossed.   Pt waiting for follow up call to schedule labs. Will you order labs for ACE-I monitoring and I will request appt scheduled 10-14 days from last dose increase on 04/17/20.   CMP Latest Ref Rng & Units 03/20/2020 01/04/2020 10/25/2018  Glucose 70 - 99 mg/dL 228(H) 196(H) 368(H)  BUN 6 - 23 mg/dL 11 15 16   Creatinine 0.40 - 1.50 mg/dL 1.16 1.27 1.19  Sodium 135 - 145 mEq/L 142 139 137  Potassium 3.5 - 5.1 mEq/L 4.4 4.2 4.5  Chloride 96 - 112 mEq/L 103 101 101  CO2 19 - 32 mEq/L 27 24 28   Calcium 8.4 - 10.5 mg/dL 9.1 9.0 9.1  Total Protein 6.0 - 8.3 g/dL - 7.3 -  Total Bilirubin 0.2 - 1.2 mg/dL - 0.9 -  Alkaline Phos 39 - 117 U/L - 64 -  AST 0 - 37 U/L - 26 -  ALT 0 - 53 U/L - 19 -    Debbora Dus, PharmD Clinical Pharmacist [facility]

## 2020-04-23 ENCOUNTER — Ambulatory Visit (INDEPENDENT_AMBULATORY_CARE_PROVIDER_SITE_OTHER): Payer: Medicare Other | Admitting: Podiatry

## 2020-04-23 ENCOUNTER — Other Ambulatory Visit: Payer: Self-pay

## 2020-04-23 ENCOUNTER — Telehealth: Payer: Self-pay

## 2020-04-23 DIAGNOSIS — E1169 Type 2 diabetes mellitus with other specified complication: Secondary | ICD-10-CM | POA: Diagnosis not present

## 2020-04-23 DIAGNOSIS — E1142 Type 2 diabetes mellitus with diabetic polyneuropathy: Secondary | ICD-10-CM | POA: Diagnosis not present

## 2020-04-23 DIAGNOSIS — L84 Corns and callosities: Secondary | ICD-10-CM

## 2020-04-23 DIAGNOSIS — B351 Tinea unguium: Secondary | ICD-10-CM

## 2020-04-23 DIAGNOSIS — E0840 Diabetes mellitus due to underlying condition with diabetic neuropathy, unspecified: Secondary | ICD-10-CM | POA: Diagnosis not present

## 2020-04-23 NOTE — Progress Notes (Signed)
  Subjective:  Patient ID: Christian [name], male    DOB: 1941-01-04,  MRN: [id number]  Chief Complaint  Patient presents with  . Nail Problem    Nail trim 1-5 bilateral  . Callouses    Callous trim bilateral. Left plantar forefoot torn callous.   80 y.o. male presents with the above complaint. History confirmed with patient. States he was peeling at the callus on his left foot and he had bleeding at the area.  Objective:  Physical Exam: warm, good capillary refill, nail exam onychomycosis of the toenails, no trophic changes or ulcerative lesions. DP pulses palpable, PT pulses palpable and protective sensation absent. HPKs submet 1 bilat, 5th right, hallux IPJ left. Left hallux ulcer healed.  Assessment:   1. Onychomycosis of multiple toenails with type 2 diabetes mellitus and peripheral neuropathy ([place])   2. Diabetes mellitus due to underlying condition with diabetic neuropathy, without long-term current use of insulin (HCC)   3. Callus    Plan:  Patient was evaluated and treated and all questions answered.  Onychomycosis, Diabetes and DPN -Patient is diabetic with a qualifying condition for at risk foot care. -Bleeding of 1st met callus left cauterized with silver nitrate.  Procedure: Nail Debridement Type of Debridement: manual, sharp debridement. Instrumentation: Nail nipper, rotary burr. Number of Nails: 10  Procedure: Callus Debridement Rationale: Patient meets criteria for routine foot care due to DPN. Type of Debridement: sharp Instrumentation: 312 blade Number of Calluses: 2  No follow-ups on file.

## 2020-04-23 NOTE — Chronic Care Management (AMB) (Addendum)
° ° °  Chronic Care Management Pharmacy Assistant   Name: Christian [name]  MRN: [id number] DOB: 11-13-1940  Reason for Encounter: Medication Review    PCP : Tonia Ghent, MD  Allergies:   Allergies  Allergen Reactions   Metformin And Related     Possible GI upset    Medications: Outpatient Encounter Medications as of 04/23/2020  Medication Sig   amLODipine (NORVASC) 2.5 MG tablet Take 1 tablet (2.5 mg total) by mouth daily.   atorvastatin (LIPITOR) 80 MG tablet Take 1 tablet (80 mg total) by mouth at bedtime.   gabapentin (NEURONTIN) 300 MG capsule Take one capsule by mouth every morning and 2 capsules by mouth at bedtime.   glipiZIDE (GLUCOTROL) 5 MG tablet Take 2 tablets by mouth in the morning and 1 tablet by mouth at night.   glucose blood (ONETOUCH ULTRA) test strip USE 1 EACH  STRIP TO CHECK GLUCOSE ONCE DAILY;  DX E11.42   insulin detemir (LEVEMIR FLEXTOUCH) 100 UNIT/ML FlexPen INJECT UP TO 20-35 UNITS SUBCUTANEOUSLY ONCE DAILY   Insulin Pen Needle (RELION PEN NEEDLE 31G/8MM) 31G X 8 MM MISC USE TO INJECT LEVEMIR DAILY   lisinopril (ZESTRIL) 5 MG tablet Take 2-3 tablets (10-15 mg total) by mouth daily.   metFORMIN (GLUCOPHAGE) 1000 MG tablet Take 0.5 tablets (500 mg total) by mouth daily.   pantoprazole (PROTONIX) 40 MG tablet Take 1 tablet by mouth twice daily   vitamin C (ASCORBIC ACID) 500 MG tablet Take 500 mg by mouth daily.   No facility-administered encounter medications on file as of 04/23/2020.    Current Diagnosis: Patient Active Problem List   Diagnosis Date Noted   Healthcare maintenance 01/10/2020   Lower back pain 09/27/2019   Thumb pain 06/25/2019   Medicare annual wellness visit, subsequent 03/12/2018   Advance care planning 03/12/2018   Shoulder pain 02/03/2017   Edema 11/02/2016   Diabetes mellitus with microalbuminuria ([place]) 06/10/2016   HLD (hyperlipidemia) 06/10/2016   DM type 2 with diabetic peripheral neuropathy ([place])    HTN (hypertension)     Accident caused by farm tractor 01/15/2016    Patient was contacted to review medication changes. Informed him Dr. [name] would like him to increase lisinopril to 15 mg daily (3 tablets of the 5 mg tablet). He should continue to monitor BP daily before coffee in the morning. States blood pressure was 154/102 pulse was 95 this morning prior to coffee. Informed him the front office would be calling to schedule his lab appointment. Date and time set up with him to review BP log on 05/01/2020.  Follow-Up:  Pharmacist Review   Debbora Dus, CPP notified  [name], [place] Assistant [phone]  I have reviewed the care management and care coordination activities outlined in this encounter and I am certifying that I agree with the content of this note. No further action required.  Debbora Dus, PharmD Clinical Pharmacist [facility] at [facility] [phone]

## 2020-04-23 NOTE — Telephone Encounter (Signed)
Christian [name], will you contact patient for lab appointment. Please schedule 10-14 days from today. Thank you.  I will call patient to discuss increasing lisinopril to 15 mg and continued monitoring.  Thanks,  Debbora Dus, PharmD Clinical Pharmacist [facility] at [facility] [phone]

## 2020-04-26 DIAGNOSIS — L905 Scar conditions and fibrosis of skin: Secondary | ICD-10-CM | POA: Diagnosis not present

## 2020-04-26 DIAGNOSIS — L814 Other melanin hyperpigmentation: Secondary | ICD-10-CM | POA: Diagnosis not present

## 2020-04-26 DIAGNOSIS — Z85828 Personal history of other malignant neoplasm of skin: Secondary | ICD-10-CM | POA: Diagnosis not present

## 2020-04-26 DIAGNOSIS — L570 Actinic keratosis: Secondary | ICD-10-CM | POA: Diagnosis not present

## 2020-04-26 DIAGNOSIS — C44212 Basal cell carcinoma of skin of right ear and external auricular canal: Secondary | ICD-10-CM | POA: Diagnosis not present

## 2020-04-26 DIAGNOSIS — D485 Neoplasm of uncertain behavior of skin: Secondary | ICD-10-CM | POA: Diagnosis not present

## 2020-04-26 DIAGNOSIS — L821 Other seborrheic keratosis: Secondary | ICD-10-CM | POA: Diagnosis not present

## 2020-05-01 ENCOUNTER — Telehealth: Payer: Self-pay

## 2020-05-01 NOTE — Chronic Care Management (AMB) (Addendum)
Chronic Care Management Pharmacy Assistant   Name: Christian [name]  MRN: [id number] DOB: 09-13-1940  Reason for Encounter: Disease State- Follow up blood pressure readings.  Patient Questions:  1.  Have you seen any other providers since your last visit? No    2.  Any changes in your medicines or health? Yes 04/23/20 Dr. Damita [name] advised patient to take 15 mg of lisinopril daily.    PCP : Christian Ghent, MD  Allergies:   Allergies  Allergen Reactions   Metformin And Related     Possible GI upset    Medications: Outpatient Encounter Medications as of 05/01/2020  Medication Sig   amLODipine (NORVASC) 2.5 MG tablet Take 1 tablet (2.5 mg total) by mouth daily.   atorvastatin (LIPITOR) 80 MG tablet Take 1 tablet (80 mg total) by mouth at bedtime.   gabapentin (NEURONTIN) 300 MG capsule Take one capsule by mouth every morning and 2 capsules by mouth at bedtime.   glipiZIDE (GLUCOTROL) 5 MG tablet Take 2 tablets by mouth in the morning and 1 tablet by mouth at night.   glucose blood (ONETOUCH ULTRA) test strip USE 1 EACH  STRIP TO CHECK GLUCOSE ONCE DAILY;  DX E11.42   insulin detemir (LEVEMIR FLEXTOUCH) 100 UNIT/ML FlexPen INJECT UP TO 20-35 UNITS SUBCUTANEOUSLY ONCE DAILY   Insulin Pen Needle (RELION PEN NEEDLE 31G/8MM) 31G X 8 MM MISC USE TO INJECT LEVEMIR DAILY   lisinopril (ZESTRIL) 5 MG tablet Take 2-3 tablets (10-15 mg total) by mouth daily.   metFORMIN (GLUCOPHAGE) 1000 MG tablet Take 0.5 tablets (500 mg total) by mouth daily.   pantoprazole (PROTONIX) 40 MG tablet Take 1 tablet by mouth twice daily   vitamin C (ASCORBIC ACID) 500 MG tablet Take 500 mg by mouth daily.   No facility-administered encounter medications on file as of 05/01/2020.    Current Diagnosis: Patient Active Problem List   Diagnosis Date Noted   Healthcare maintenance 01/10/2020   Lower back pain 09/27/2019   Thumb pain 06/25/2019   Medicare annual wellness visit, subsequent 03/12/2018   Advance  care planning 03/12/2018   Shoulder pain 02/03/2017   Edema 11/02/2016   Diabetes mellitus with microalbuminuria ([place]) 06/10/2016   HLD (hyperlipidemia) 06/10/2016   DM type 2 with diabetic peripheral neuropathy ([place])    HTN (hypertension)    Accident caused by farm tractor 01/15/2016    [name] was contacted to follow up on his blood pressure after Dr. Damita [name] increased lisinopril to 15 mg daily. When I spoke with patient he states that he has not increased his lisinopril, that he forgot. Blood pressure readings were done prior to coffee in the morning and are as follows: 05/03/19- 162/101  95          152/91  92 (30 mins after 1st reading) 05/02/19- 192/98  79 05/01/19- 182/92  79 04/30/19- 168/96  83 04/29/19- 157/89  86 04/28/19- 150/87  88 04/27/19- 165/88  75 04/26/19- 163/101 80 04/25/19- 175/74  85 04/24/19- 154/102  95          180/98  75  (30 mins after 1st reading)  Explained to patient that Dr. Damita [name] wanted him to increase his lisinopril to 15 mg daily. Patient verbalized understanding and agreed to a follow up in 1 week on 05/09/20 to review BP monitoring.  Follow-Up:  Pharmacist Review   Christian [name], CPP notified  Christian [name], [place] Assistant [phone]  I have reviewed the care management and  care coordination activities outlined in this encounter and I am certifying that I agree with the content of this note. No further action required.  Christian [name], PharmD Clinical Pharmacist [facility] at [facility] [phone]

## 2020-05-02 NOTE — Patient Instructions (Signed)
Dear Gayleen Orem,  Below is a summary of the goals we discussed during our follow up appointment on April 22, 2020. Please contact me anytime with questions or concerns.   Visit Information  Goals Addressed            This Visit's Progress   . Pharmacy Care Plan       CARE PLAN ENTRY (see longitudinal plan of care for additional care plan information)  Current Barriers:  . Chronic Disease Management support, education, and care coordination needs related to Hypertension and Diabetes   Hypertension BP Readings from Last 3 Encounters:  04/16/20 (!) 162/84  03/12/20 (!) 142/82  01/16/20 (!) 164/93   . Pharmacist Clinical Goal(s): o Over the next 2 months, patient will work with PharmD and providers to achieve BP goal <130/80 mmHg . Current regimen:  o Amlodipine 2.5 mg - 1 tablet daily o Lisinopril 5 mg - 2 tablets daily . Interventions: o Increase lisinopril to 3 tablets (15 mg daily) per PCP consult o Continue to monitor home BP . Patient self care activities - Over the next 2 months, patient will: o Check BP once daily for 2 weeks, document, and provide at future appointments o Increase lisinopril to 15 mg daily  o Ensure daily salt intake < 2300 mg/day  Diabetes Lab Results  Component Value Date/Time   HGBA1C 9.5 (A) 04/16/2020 09:27 AM   HGBA1C 9.8 (H) 01/04/2020 08:02 AM   HGBA1C 8.6 (A) 09/25/2019 08:09 AM   HGBA1C 10.5 (H) 10/25/2018 12:49 PM   . Pharmacist Clinical Goal(s): o Over the next 2 months, patient will work with PharmD and providers to achieve A1c goal <7% . Current regimen:   Levemir - Inject 32 units daily   Metformin 1000 mg - 1/2 tablet BID   Glipizide 5 mg - 2 in the morning and 1 at night  . Interventions: o Reviewed home BG monitoring - trending in right direction with insulin dose increase . Patient self care activities - Over the next 2 months, patient will: o Check blood sugar once daily, document, and provide at future  appointments o Contact provider with any episodes of hypoglycemia  Please see past updates related to this goal by clicking on the "Past Updates" button in the selected goal        Patient verbalizes understanding of instructions provided today.  The pharmacy team will reach out to the patient again over the next 30 days.   Phil Dopp, RPH   Low-Sodium Eating Plan Sodium, which is an element that makes up salt, helps you maintain a healthy balance of fluids in your body. Too much sodium can increase your blood pressure and cause fluid and waste to be held in your body. Your health care provider or dietitian may recommend following this plan if you have high blood pressure (hypertension), kidney disease, liver disease, or heart failure. Eating less sodium can help lower your blood pressure, reduce swelling, and protect your heart, liver, and kidneys. What are tips for following this plan? Reading food labels  The Nutrition Facts label lists the amount of sodium in one serving of the food. If you eat more than one serving, you must multiply the listed amount of sodium by the number of servings.  Choose foods with less than 140 mg of sodium per serving.  Avoid foods with 300 mg of sodium or more per serving. Shopping  Look for lower-sodium products, often labeled as "low-sodium" or "no salt added."  Always check the sodium content, even if foods are labeled as "unsalted" or "no salt added."  Buy fresh foods. ? Avoid canned foods and pre-made or frozen meals. ? Avoid canned, cured, or processed meats.  Buy breads that have less than 80 mg of sodium per slice.   Cooking  Eat more home-cooked food and less restaurant, buffet, and fast food.  Avoid adding salt when cooking. Use salt-free seasonings or herbs instead of table salt or sea salt. Check with your health care provider or pharmacist before using salt substitutes.  Cook with plant-based oils, such as canola, sunflower,  or olive oil.   Meal planning  When eating at a restaurant, ask that your food be prepared with less salt or no salt, if possible. Avoid dishes labeled as brined, pickled, cured, smoked, or made with soy sauce, miso, or teriyaki sauce.  Avoid foods that contain MSG (monosodium glutamate). MSG is sometimes added to [place] food, bouillon, and some canned foods.  Make meals that can be grilled, baked, poached, roasted, or steamed. These are generally made with less sodium. General information Most people on this plan should limit their sodium intake to 1,500-2,000 mg (milligrams) of sodium each day. What foods should I eat? Fruits Fresh, frozen, or canned fruit. Fruit juice. Vegetables Fresh or frozen vegetables. "No salt added" canned vegetables. "No salt added" tomato sauce and paste. Low-sodium or reduced-sodium tomato and vegetable juice. Grains Low-sodium cereals, including oats, puffed wheat and rice, and shredded wheat. Low-sodium crackers. Unsalted rice. Unsalted pasta. Low-sodium bread. Whole-grain breads and whole-grain pasta. Meats and other proteins Fresh or frozen (no salt added) meat, poultry, seafood, and fish. Low-sodium canned tuna and salmon. Unsalted nuts. Dried peas, beans, and lentils without added salt. Unsalted canned beans. Eggs. Unsalted nut butters. Dairy Milk. Soy milk. Cheese that is naturally low in sodium, such as ricotta cheese, fresh mozzarella, or Swiss cheese. Low-sodium or reduced-sodium cheese. Cream cheese. Yogurt. Seasonings and condiments Fresh and dried herbs and spices. Salt-free seasonings. Low-sodium mustard and ketchup. Sodium-free salad dressing. Sodium-free light mayonnaise. Fresh or refrigerated horseradish. Lemon juice. Vinegar. Other foods Homemade, reduced-sodium, or low-sodium soups. Unsalted popcorn and pretzels. Low-salt or salt-free chips. The items listed above may not be a complete list of foods and beverages you can eat. Contact a  dietitian for more information. What foods should I avoid? Vegetables Sauerkraut, pickled vegetables, and relishes. Olives. [place] fries. Onion rings. Regular canned vegetables (not low-sodium or reduced-sodium). Regular canned tomato sauce and paste (not low-sodium or reduced-sodium). Regular tomato and vegetable juice (not low-sodium or reduced-sodium). Frozen vegetables in sauces. Grains Instant hot cereals. Bread stuffing, pancake, and biscuit mixes. Croutons. Seasoned rice or pasta mixes. Noodle soup cups. Boxed or frozen macaroni and cheese. Regular salted crackers. Self-rising flour. Meats and other proteins Meat or fish that is salted, canned, smoked, spiced, or pickled. Precooked or cured meat, such as sausages or meat loaves. Berniece Salines. Ham. Pepperoni. Hot dogs. Corned beef. Chipped beef. Salt pork. Jerky. Pickled herring. Anchovies and sardines. Regular canned tuna. Salted nuts. Dairy Processed cheese and cheese spreads. Hard cheeses. Cheese curds. Blue cheese. Feta cheese. String cheese. Regular cottage cheese. Buttermilk. Canned milk. Fats and oils Salted butter. Regular margarine. Ghee. Bacon fat. Seasonings and condiments Onion salt, garlic salt, seasoned salt, table salt, and sea salt. Canned and packaged gravies. Worcestershire sauce. Tartar sauce. Barbecue sauce. Teriyaki sauce. Soy sauce, including reduced-sodium. Steak sauce. Fish sauce. Oyster sauce. Cocktail sauce. Horseradish that you find on the shelf.  Regular ketchup and mustard. Meat flavorings and tenderizers. Bouillon cubes. Hot sauce. Pre-made or packaged marinades. Pre-made or packaged taco seasonings. Relishes. Regular salad dressings. Salsa. Other foods Salted popcorn and pretzels. Corn chips and puffs. Potato and tortilla chips. Canned or dried soups. Pizza. Frozen entrees and pot pies. The items listed above may not be a complete list of foods and beverages you should avoid. Contact a dietitian for more  information. Summary  Eating less sodium can help lower your blood pressure, reduce swelling, and protect your heart, liver, and kidneys.  Most people on this plan should limit their sodium intake to 1,500-2,000 mg (milligrams) of sodium each day.  Canned, boxed, and frozen foods are high in sodium. Restaurant foods, fast foods, and pizza are also very high in sodium. You also get sodium by adding salt to food.  Try to cook at home, eat more fresh fruits and vegetables, and eat less fast food and canned, processed, or prepared foods. This information is not intended to replace advice given to you by your health care provider. Make sure you discuss any questions you have with your health care provider. Document Revised: 05/05/2019 Document Reviewed: 03/01/2019 Elsevier Patient Education  2021 [employer].

## 2020-05-09 ENCOUNTER — Other Ambulatory Visit: Payer: Medicare Other

## 2020-05-09 ENCOUNTER — Telehealth: Payer: Self-pay

## 2020-05-09 ENCOUNTER — Other Ambulatory Visit: Payer: Self-pay

## 2020-05-09 ENCOUNTER — Other Ambulatory Visit (INDEPENDENT_AMBULATORY_CARE_PROVIDER_SITE_OTHER): Payer: Medicare Other

## 2020-05-09 ENCOUNTER — Other Ambulatory Visit: Payer: BLUE CROSS/BLUE SHIELD

## 2020-05-09 DIAGNOSIS — I10 Essential (primary) hypertension: Secondary | ICD-10-CM | POA: Diagnosis not present

## 2020-05-09 LAB — BASIC METABOLIC PANEL
BUN: 16 mg/dL (ref 6–23)
CO2: 29 mEq/L (ref 19–32)
Calcium: 9.5 mg/dL (ref 8.4–10.5)
Chloride: 104 mEq/L (ref 96–112)
Creatinine, Ser: 1.36 mg/dL (ref 0.40–1.50)
GFR: 49.73 mL/min — ABNORMAL LOW (ref >60.00)
Glucose, Bld: 120 mg/dL — ABNORMAL HIGH (ref 70–99)
Potassium: 4.3 mEq/L (ref 3.5–5.1)
Sodium: 142 mEq/L (ref 135–145)

## 2020-05-09 NOTE — Chronic Care Management (AMB) (Signed)
    Chronic Care Management Pharmacy Assistant   Name: Christian [name]  MRN: [id number] DOB: [birth year]  Reason for Encounter: Disease State- Hypertension  Patient Questions:  1.  Have you seen any other providers since your last visit? No  2.  Any changes in your medicines or health? No     PCP : Christian Ghent, MD  Allergies:   Allergies  Allergen Reactions  . Metformin And Related     Possible GI upset    Medications: Outpatient Encounter Medications as of 05/09/2020  Medication Sig  . amLODipine (NORVASC) 2.5 MG tablet Take 1 tablet (2.5 mg total) by mouth daily.  Marland Kitchen atorvastatin (LIPITOR) 80 MG tablet Take 1 tablet (80 mg total) by mouth at bedtime.  . gabapentin (NEURONTIN) 300 MG capsule Take one capsule by mouth every morning and 2 capsules by mouth at bedtime.  Marland Kitchen glipiZIDE (GLUCOTROL) 5 MG tablet Take 2 tablets by mouth in the morning and 1 tablet by mouth at night.  Marland Kitchen glucose blood (ONETOUCH ULTRA) test strip USE 1 EACH  STRIP TO CHECK GLUCOSE ONCE DAILY;  DX E11.42  . insulin detemir (LEVEMIR FLEXTOUCH) 100 UNIT/ML FlexPen INJECT UP TO 20-35 UNITS SUBCUTANEOUSLY ONCE DAILY  . Insulin Pen Needle (RELION PEN NEEDLE 31G/8MM) 31G X 8 MM MISC USE TO INJECT LEVEMIR DAILY  . lisinopril (ZESTRIL) 5 MG tablet Take 2-3 tablets (10-15 mg total) by mouth daily.  . metFORMIN (GLUCOPHAGE) 1000 MG tablet Take 0.5 tablets (500 mg total) by mouth daily.  . pantoprazole (PROTONIX) 40 MG tablet Take 1 tablet by mouth twice daily  . vitamin C (ASCORBIC ACID) 500 MG tablet Take 500 mg by mouth daily.   No facility-administered encounter medications on file as of 05/09/2020.    Current Diagnosis: Patient Active Problem List   Diagnosis Date Noted  . Healthcare maintenance 01/10/2020  . Lower back pain 09/27/2019  . Thumb pain 06/25/2019  . Medicare annual wellness visit, subsequent 03/12/2018  . Advance care planning 03/12/2018  . Shoulder pain 02/03/2017  . Edema 11/02/2016  .  Diabetes mellitus with microalbuminuria ([place]) 06/10/2016  . HLD (hyperlipidemia) 06/10/2016  . DM type 2 with diabetic peripheral neuropathy ([place])   . HTN (hypertension)   . Accident caused by farm tractor 01/15/2016   Contacted [name] to follow up on blood pressure readings since increasing his lisinopril. Patient has not increased his lisinopril, reminded him again that Dr. Damita [name] wanted him to take 3 tablets of the 5 mg.  Readings are as follows: 05/09/20- 153/93 - 85 05/08/20- 171/97 - 88 05/07/20- 196/88 - 78  Explained again to patient that Dr. Damita [name] has requested he take 15 mg (3 tablets of 5 mg) daily. Will follow up next week to review updated readings.    Follow-Up:  Pharmacist Review   Christian [name], CPP notified  Christian [name], [place] Pharmacy Assistant [phone]

## 2020-05-13 ENCOUNTER — Telehealth: Payer: Self-pay | Admitting: Family Medicine

## 2020-05-13 NOTE — Telephone Encounter (Signed)
Patient called in stating that he is due for a medication refill. The patient did not remember the name of it but says its the last one that Dr [name] prescribed him. He is asking that we resend to them.  [address]

## 2020-05-14 NOTE — Telephone Encounter (Signed)
Spoke with patient and he does not need any refills at this time. He was mistaken when he called yesterday.

## 2020-05-15 ENCOUNTER — Other Ambulatory Visit: Payer: Self-pay | Admitting: Family Medicine

## 2020-05-15 MED ORDER — LISINOPRIL 5 MG PO TABS: 15.0000 mg | ORAL_TABLET | Freq: Every day | ORAL | Status: DC

## 2020-05-17 ENCOUNTER — Other Ambulatory Visit: Payer: Self-pay

## 2020-05-20 ENCOUNTER — Other Ambulatory Visit: Payer: Self-pay

## 2020-05-20 ENCOUNTER — Ambulatory Visit (INDEPENDENT_AMBULATORY_CARE_PROVIDER_SITE_OTHER): Payer: Medicare Other | Admitting: Family Medicine

## 2020-05-20 ENCOUNTER — Encounter: Payer: Self-pay | Admitting: Family Medicine

## 2020-05-20 VITALS — BP 126/80 | HR 89 | Temp 97.5°F | Ht 74.0 in | Wt 217.0 lb

## 2020-05-20 DIAGNOSIS — M5430 Sciatica, unspecified side: Secondary | ICD-10-CM

## 2020-05-20 DIAGNOSIS — E1142 Type 2 diabetes mellitus with diabetic polyneuropathy: Secondary | ICD-10-CM

## 2020-05-20 DIAGNOSIS — M545 Low back pain, unspecified: Secondary | ICD-10-CM

## 2020-05-20 NOTE — Patient Instructions (Signed)
I'll work on getting the MRI and referral set up and we'll go from there.   Drop off your sugar log when possible.   Take care.  Glad to see you.

## 2020-05-20 NOTE — Progress Notes (Signed)
This visit occurred during the SARS-CoV-2 public health emergency.  Safety protocols were in place, including screening questions prior to the visit, additional usage of staff PPE, and extensive cleaning of exam room while observing appropriate contact time as indicated for disinfecting solutions.  R hip pain and lower back pain.  Prev injury noted.  He had been mowing leaves in his yard but had sig pain thereafter, when walking.  Also had sig pain with golfing- with swinging a driver.  He had prev injection per Dr. Marcelino Scot at the end of 03/2020 but that didn't help much, not nearly as much as prev.  Still has sig R thigh pain after the injection.  He was asking about getting a second opinion from Dr. Ellene Route.   Minimal L leg radicular sx, esp compared to the R leg.    Walking with a cane at baseline.   His sugar is ~150s, diet dependent.  He is going to drop off a sugar log.  GI sx better on lower dose of metformin.  Taking 32 units per day.   Meds, vitals, and allergies reviewed.   ROS: Per HPI unless specifically indicated in ROS section   nad ncat Neck supple, no LA rrr ctab abd soft, not ttp.  SLR neg at OV.  He has limited B hip int rotation at baseline.  Strength wnl BLE.  Able to bear weight.  32 minutes were devoted to patient care in this encounter (this includes time spent reviewing the patient's file/history, interviewing and examining the patient, counseling/reviewing plan with patient).

## 2020-05-22 NOTE — Assessment & Plan Note (Signed)
He will drop off his sugar log and we will go from there. He agrees with plan.

## 2020-05-22 NOTE — Assessment & Plan Note (Signed)
With sciatica. Longstanding symptoms. He wanted a second opinion. Given duration, reasonable to get MRI L-spine and refer to neurosurgery. He agrees with plan.

## 2020-05-24 ENCOUNTER — Telehealth: Payer: Self-pay

## 2020-05-24 NOTE — Telephone Encounter (Addendum)
Contacted patient by telephone to discuss blood pressure lifestyle changes.   1)  Recommend switching Advil for Arthritis Strength Tylenol.  Taking 2-3 Advil 3-4 times a week for back and leg pain. Denies trying other medication for back pain. Open to switching to Tylenol.  2) Discussed caffeine. Reports 1 cup of coffee in the morning only and maybe one 16 ounce Coke Zero in the afternoon. This is < 100 mg caffeine per day so okay to continue. If drinking tea, he has caffeine free.   3) Discussed salt. Reports he loves it. Discussed hidden sodium in processed foods and restaurant foods. Recommended trying to purchase things with low sodium. Eat whole foods primarily. Discussed nutrition labels and sodium content.  4) Patient confirms taking Lisinopril 5 mg - 3 tablets daily for the past week. Recommended he go ahead and increase to 4 tablets daily (20 mg - already noted on current rx he can take up to 4 daily). He just opened a 90 DS last week. Asked him to call when running low so we can change this to a 20 mg tablet.   He mailed a copy of his BG and BP log to the office, but still had his copy at home. Discussed diabetes BG log. He has started diet journal for diabetes.  Noticed his fasting BG is lower when he eats vegetables for dinner. Trying to improve diet. Reports fasting BG last 7 days --> 183, 187, 127, 124, 197, 116.  Encouraged dietary change for BG improvement.  Patient appreciates close follow up and discussion. Confirms understanding of above.   Debbora Dus, PharmD Clinical Pharmacist [place] [facility] at [facility] [phone]

## 2020-05-24 NOTE — Chronic Care Management (AMB) (Addendum)
Chronic Care Management Pharmacy Assistant   Name: Christian [name]  MRN: [id number] DOB: [birth year]  Reason for Encounter: Disease state - Update BP log  PCP : Tonia Ghent, MD  Allergies:   Allergies  Allergen Reactions   Metformin And Related     Possible GI upset    Medications: Outpatient Encounter Medications as of 05/24/2020  Medication Sig   amLODipine (NORVASC) 2.5 MG tablet Take 1 tablet (2.5 mg total) by mouth daily.   atorvastatin (LIPITOR) 80 MG tablet Take 1 tablet (80 mg total) by mouth at bedtime.   gabapentin (NEURONTIN) 300 MG capsule Take one capsule by mouth every morning and 2 capsules by mouth at bedtime.   glipiZIDE (GLUCOTROL) 5 MG tablet Take 2 tablets by mouth in the morning and 1 tablet by mouth at night.   glucose blood (ONETOUCH ULTRA) test strip USE 1 EACH  STRIP TO CHECK GLUCOSE ONCE DAILY;  DX E11.42   insulin detemir (LEVEMIR FLEXTOUCH) 100 UNIT/ML FlexPen INJECT UP TO 20-35 UNITS SUBCUTANEOUSLY ONCE DAILY   Insulin Pen Needle (RELION PEN NEEDLE 31G/8MM) 31G X 8 MM MISC USE TO INJECT LEVEMIR DAILY   lisinopril (ZESTRIL) 5 MG tablet Take 3-4 tablets (15-20 mg total) by mouth daily.   metFORMIN (GLUCOPHAGE) 1000 MG tablet Take 0.5 tablets (500 mg total) by mouth daily.   pantoprazole (PROTONIX) 40 MG tablet Take 1 tablet by mouth twice daily   vitamin C (ASCORBIC ACID) 500 MG tablet Take 500 mg by mouth daily.   No facility-administered encounter medications on file as of 05/24/2020.    Current Diagnosis: Patient Active Problem List   Diagnosis Date Noted   Healthcare maintenance 01/10/2020   Lower back pain 09/27/2019   Thumb pain 06/25/2019   Medicare annual wellness visit, subsequent 03/12/2018   Advance care planning 03/12/2018   Shoulder pain 02/03/2017   Edema 11/02/2016   Diabetes mellitus with microalbuminuria ([place]) 06/10/2016   HLD (hyperlipidemia) 06/10/2016   DM type 2 with diabetic peripheral neuropathy ([place])    HTN  (hypertension)    Accident caused by farm tractor 01/15/2016   Colon polyps 06/24/2013     Reviewed chart prior to disease state call. Spoke with patient regarding BP  Recent Office Vitals: BP Readings from Last 3 Encounters:  05/20/20 126/80  04/16/20 (!) 162/84  03/12/20 (!) 142/82   Pulse Readings from Last 3 Encounters:  05/20/20 89  04/16/20 97  03/12/20 77    Wt Readings from Last 3 Encounters:  05/20/20 217 lb (98.4 kg)  04/16/20 218 lb 3.2 oz (99 kg)  03/12/20 225 lb (102.1 kg)     Kidney Function Lab Results  Component Value Date/Time   CREATININE 1.36 05/09/2020 11:23 AM   CREATININE 1.16 03/20/2020 08:15 AM   GFR 49.73 (L) 05/09/2020 11:23 AM   GFRNONAA >60 05/17/2016 06:59 AM   GFRAA >60 05/17/2016 06:59 AM    BMP Latest Ref Rng & Units 05/09/2020 03/20/2020 01/04/2020  Glucose 70 - 99 mg/dL 120(H) 228(H) 196(H)  BUN 6 - 23 mg/dL 16 11 15   Creatinine 0.40 - 1.50 mg/dL 1.36 1.16 1.27  Sodium 135 - 145 mEq/L 142 142 139  Potassium 3.5 - 5.1 mEq/L 4.3 4.4 4.2  Chloride 96 - 112 mEq/L 104 103 101  CO2 19 - 32 mEq/L 29 27 24   Calcium 8.4 - 10.5 mg/dL 9.5 9.1 9.0    Current antihypertensive regimen:  Amlodipine 2.5 mg - 1 tablet daily Lisinopril 5  mg - 3 tablets daily (Started taking 3 tablets about 1 week ago)   How often are you checking your Blood Pressure? daily  he checks his blood pressure in the morning before taking his medication.  Current home BP readings: Patient states he mailed January and February's readings to Dr. Josefine Class office.   DATE:             BP               PULSE 05/24/20 169/97  86 05/23/20 168/99  90  Wrist or arm cuff: Arm  Caffeine intake: Drinks coffee in morning and various types of caffeinated soft drinks throughout the day Salt intake: States he does not limit salt.  OTC medications including pseudoephedrine or NSAIDs? Advil almost daily  Adherence Review: Is the patient currently on ACE/ARB medication? Yes Does the  patient have >5 day gap between last estimated fill dates? No  Follow-Up:  Pharmacist Review  Debbora Dus, CPP notified  [name], [place] Assistant [phone]   I have reviewed the care management and care coordination activities outlined in this encounter and I am certifying that I agree with the content of this note.  Debbora Dus, PharmD Clinical Pharmacist [facility] at [facility] [phone]

## 2020-06-05 DIAGNOSIS — C44212 Basal cell carcinoma of skin of right ear and external auricular canal: Secondary | ICD-10-CM | POA: Diagnosis not present

## 2020-06-10 ENCOUNTER — Ambulatory Visit
Admission: RE | Admit: 2020-06-10 | Discharge: 2020-06-10 | Disposition: A | Discharge status: Home / Self Care | Payer: BLUE CROSS/BLUE SHIELD | Source: Ambulatory Visit | Attending: Family Medicine | Admitting: Family Medicine

## 2020-06-10 ENCOUNTER — Other Ambulatory Visit: Payer: Self-pay

## 2020-06-10 DIAGNOSIS — K5730 Diverticulosis of large intestine without perforation or abscess without bleeding: Secondary | ICD-10-CM | POA: Diagnosis not present

## 2020-06-10 DIAGNOSIS — M545 Low back pain, unspecified: Secondary | ICD-10-CM

## 2020-06-10 DIAGNOSIS — M5116 Intervertebral disc disorders with radiculopathy, lumbar region: Secondary | ICD-10-CM | POA: Diagnosis not present

## 2020-06-10 DIAGNOSIS — M48061 Spinal stenosis, lumbar region without neurogenic claudication: Secondary | ICD-10-CM | POA: Diagnosis not present

## 2020-06-10 DIAGNOSIS — M4726 Other spondylosis with radiculopathy, lumbar region: Secondary | ICD-10-CM | POA: Diagnosis not present

## 2020-06-10 DIAGNOSIS — M5430 Sciatica, unspecified side: Secondary | ICD-10-CM

## 2020-07-03 DIAGNOSIS — I10 Essential (primary) hypertension: Secondary | ICD-10-CM | POA: Diagnosis not present

## 2020-07-03 DIAGNOSIS — M47816 Spondylosis without myelopathy or radiculopathy, lumbar region: Secondary | ICD-10-CM | POA: Diagnosis not present

## 2020-07-05 ENCOUNTER — Other Ambulatory Visit: Payer: Self-pay | Admitting: Family Medicine

## 2020-07-15 DIAGNOSIS — M5416 Radiculopathy, lumbar region: Secondary | ICD-10-CM | POA: Diagnosis not present

## 2020-07-15 DIAGNOSIS — M5116 Intervertebral disc disorders with radiculopathy, lumbar region: Secondary | ICD-10-CM | POA: Diagnosis not present

## 2020-07-18 ENCOUNTER — Other Ambulatory Visit: Payer: Self-pay

## 2020-07-18 ENCOUNTER — Telehealth: Payer: Self-pay

## 2020-07-18 ENCOUNTER — Ambulatory Visit (INDEPENDENT_AMBULATORY_CARE_PROVIDER_SITE_OTHER): Payer: Medicare Other

## 2020-07-18 DIAGNOSIS — E785 Hyperlipidemia, unspecified: Secondary | ICD-10-CM | POA: Diagnosis not present

## 2020-07-18 DIAGNOSIS — E1142 Type 2 diabetes mellitus with diabetic polyneuropathy: Secondary | ICD-10-CM

## 2020-07-18 DIAGNOSIS — I10 Essential (primary) hypertension: Secondary | ICD-10-CM

## 2020-07-18 NOTE — Patient Instructions (Signed)
Dear Christian [name],  Below is a summary of the goals we discussed during our follow up appointment on July 18, 2020. Please contact me anytime with questions or concerns.   Visit Information  Patient Care Plan: CCM Pharmacy Care Plan    Problem Identified: CHL AMB "PATIENT-SPECIFIC PROBLEM"     Long-Range Goal: Disease Management   Start Date: 07/18/2020  Priority: High  Note:    Current Barriers:  . Unable to achieve control of blood pressure  . Blood glucose elevated due to recent steroid injection  Pharmacist Clinical Goal(s):  Marland Kitchen Patient will adhere to plan to optimize therapeutic regimen for blood pressure as evidenced by report of adherence to recommended medication management changes through collaboration with PharmD and provider.   Interventions: . 1:1 collaboration with Tonia Ghent, MD regarding development and update of comprehensive plan of care as evidenced by provider attestation and co-signature . Inter-disciplinary care team collaboration (see longitudinal plan of care) . Comprehensive medication review performed; medication list updated in electronic medical record  Hypertension (BP goal <140/90) -Uncontrolled - per home BP monitoring elevated -Current treatment:  Amlodipine 2.5 mg - 1 tablet daily  Lisinopril 5 mg - 4 tablets (20 mg) daily -Medications previously tried: swelling with amlodipine 5 mg  -Confirms 4 tablets lisinopril but he ran out 3-4 days ago. -Current home readings: last 7 days   164/95, 79  177/94, 81  156/96, 80  149/100, 79    145/87, 74  188/87, 67 -Current dietary habits: watching salt lately -He replaced Advil with Tylenol since last discussion -Denies hypotensive/hypertensive symptoms -Pt is at [place] today with his wife. She has a mini-stroke. He has not taken any meds today. Asked him to take these as soon as possible. Requested lisinopril refill and dose increase to 30 mg from PCP. -Counseled to monitor BP at home  daily, document, and provide log at future appointments -Collaborated with PCP - recommend increase lisinopril to 30 mg daily. CMA follow up call next month for BP log.  Hyperlipidemia: (LDL goal < 100) -Controlled - LDL 19 -Current treatent:  Atorvastatin 80 mg - 1 daily -Refills timely, last filled 04/16/20 90 DS - DUE  -Recommended to continue current medication: CMA adherence review next month.  Diabetes (A1c goal <7%) -Uncontrolled -Current medications:  Levemir - Inject 32 units daily (increased 04/16/20)  Metformin 1000 mg - 1/2 tablet AM and 1/2 tablet PM   Glipizide 5 mg - 2 in the morning and 1 at night  -Medications previously tried: metformin 1000 mg BID (intolerable GI symptoms) -Current home glucose readings - last 7 days below, fasting . fasting glucose: 186, 150, 174, 132, 336 (steroid shot), 320 . post prandial glucose: none  -Denies hypoglycemic/hyperglycemic symptoms -Current meal patterns: low carb, high vegetables -Current exercise: minimal -Counseled to check feet daily and get yearly eye exams -Recommended to continue current medication; CMA call for BG log in 1 week to ensure BG is coming down.   Patient Goals/Self-Care Activities . Patient will:  - take medications as prescribed check glucose daily, document, and provide at future appointments check blood pressure daily, document, and provide at future appointments  Follow Up Plan: The care management team will reach out to the patient again over the next 7 days.       The patient verbalized understanding of instructions, educational materials, and care plan provided today and declined offer to receive copy of patient instructions, educational materials, and care plan.   Debbora Dus,  PharmD Clinical Pharmacist [facility] at [facility] [phone]

## 2020-07-18 NOTE — Telephone Encounter (Signed)
Please call patient in next 5-7 days to ensure BG is coming down. We believe its currently high due to steroid injection. Pt's wife currently in hospital so we were unable to make any med changes today.  Debbora Dus, PharmD Clinical Pharmacist [place] [facility] at [facility] [phone]

## 2020-07-18 NOTE — Progress Notes (Addendum)
Chronic Care Management Pharmacy Note  07/18/2020 Name:  Christian [name] MRN:  [id number] DOB:  1940/09/07  Subjective: Christian [name] is an 80 y.o. year old male who is a primary patient of [name], [name], MD.  The CCM team was consulted for assistance with disease management and care coordination needs.    Engaged with patient by telephone for follow up visit in response to provider referral for pharmacy case management and/or care coordination services.   Consent to Services:  The patient was given information about Chronic Care Management services, agreed to services, and gave verbal consent prior to initiation of services.  Please see initial visit note for detailed documentation.   Patient Care Team: Tonia Ghent, MD as PCP - General (Family Medicine) Debbora Dus, [facility] as Pharmacist (Pharmacist)  Recent office visits: 05/24/20 - CCM - elevated home BP, reduce salt, avoid NSAIDs 05/20/20 - Back pain - MRI, referral placed for Dr. Ellene Route  Recent consult visits: 07/03/20 - Neuro - note unavailable  Hospital visits: None in previous 6 months  Objective:  Lab Results  Component Value Date   CREATININE 1.36 05/09/2020   BUN 16 05/09/2020   GFR 49.73 (L) 05/09/2020   GFRNONAA >60 05/17/2016   GFRAA >60 05/17/2016   NA 142 05/09/2020   K 4.3 05/09/2020   CALCIUM 9.5 05/09/2020   CO2 29 05/09/2020   GLUCOSE 120 (H) 05/09/2020    Lab Results  Component Value Date/Time   HGBA1C 9.5 (A) 04/16/2020 09:27 AM   HGBA1C 9.8 (H) 01/04/2020 08:02 AM   HGBA1C 8.6 (A) 09/25/2019 08:09 AM   HGBA1C 10.5 (H) 10/25/2018 12:49 PM   GFR 49.73 (L) 05/09/2020 11:23 AM   GFR 60.25 03/20/2020 08:15 AM   MICROALBUR 1.6 01/04/2020 08:02 AM   MICROALBUR 0.7 03/07/2018 09:41 AM    Last diabetic Eye exam:  Lab Results  Component Value Date/Time   HMDIABEYEEXA No Retinopathy 11/21/2019 12:00 AM    Last diabetic Foot exam: 01/09/20 PCP   Lab Results  Component Value Date   CHOL 85  01/04/2020   HDL 27.70 (L) 01/04/2020   LDLDIRECT 19.0 01/04/2020   TRIG 391.0 (H) 01/04/2020   CHOLHDL 3 01/04/2020    Hepatic Function Latest Ref Rng & Units 01/04/2020 03/01/2018 11/02/2016  Total Protein 6.0 - 8.3 g/dL 7.3 7.3 7.0  Albumin 3.5 - 5.2 g/dL 4.2 4.3 4.3  AST 0 - 37 U/L _0 ALT 0 - 53 U/L _1 Alk Phosphatase 39 - 117 U/L 64 74 79  Total Bilirubin 0.2 - 1.2 mg/dL 0.9 0.8 0.6    Lab Results  Component Value Date/Time   TSH 3.337 03/06/2016 03:45 AM    CBC Latest Ref Rng & Units 11/02/2016 06/03/2016 05/17/2016  WBC 4.0 - 10.5 K/uL 7.0 8.4 8.9  Hemoglobin 13.0 - 17.0 g/dL 12.5(L) 12.3(L) 11.5(L)  Hematocrit 39.0 - 52.0 % 38.5(L) 36.4(L) 35.6(L)  Platelets 150.0 - 400.0 K/uL 177.0 239.0 231    No results found for: VD25OH  Clinical ASCVD: No  The ASCVD Risk score Mikey Bussing DC Jr., et al., 2013) failed to calculate for the following reasons:   The valid total cholesterol range is 130 to 320 mg/dL    Depression screen [facility] 2/9 04/16/2020 03/08/2018 06/03/2016  Decreased Interest 0 0 0  Down, Depressed, Hopeless 0 0 0  PHQ - 2 Score 0 0 0    Social History   Tobacco Use  Smoking Status  Never Smoker  Smokeless Tobacco Never Used   BP Readings from Last 3 Encounters:  05/20/20 126/80  04/16/20 (!) 162/84  03/12/20 (!) 142/82   Pulse Readings from Last 3 Encounters:  05/20/20 89  04/16/20 97  03/12/20 77   Wt Readings from Last 3 Encounters:  05/20/20 217 lb (98.4 kg)  04/16/20 218 lb 3.2 oz (99 kg)  03/12/20 225 lb (102.1 kg)   BMI Readings from Last 3 Encounters:  05/20/20 27.86 kg/m  04/16/20 28.02 kg/m  03/12/20 29.69 kg/m    Assessment/Interventions: Review of patient past medical history, allergies, medications, health status, including review of consultants reports, laboratory and other test data, was performed as part of comprehensive evaluation and provision of chronic care management services.   SDOH:  (Social Determinants of  Health) assessments and interventions performed: Yes SDOH Interventions   Flowsheet Row Most Recent Value  SDOH Interventions   Financial Strain Interventions Intervention Not Indicated  [Meds affordable]     SDOH Screenings   Alcohol Screen: Not on file  Depression (PHQ2-9): Low Risk   . PHQ-2 Score: 0  Financial Resource Strain: Low Risk   . Difficulty of Paying Living Expenses: Not very hard  Food Insecurity: Not on file  Housing: Not on file  Physical Activity: Not on file  Social Connections: Not on file  Stress: Not on file  Tobacco Use: Low Risk   . Smoking Tobacco Use: Never Smoker  . Smokeless Tobacco Use: Never Used  Transportation Needs: Not on file    CCM Care Plan  Allergies  Allergen Reactions  . Metformin And Related     Possible GI upset    Medications Reviewed Today    Reviewed by Debbora Dus, [facility] (Pharmacist) on 07/18/20 at [place] List Status: <None>  Medication Order Taking? Sig Documenting Provider Last Dose Status Informant  amLODipine (NORVASC) 2.5 MG tablet [id number] No Take 1 tablet (2.5 mg total) by mouth daily. Tonia Ghent, MD Taking Active   atorvastatin (LIPITOR) 80 MG tablet [id number] No Take 1 tablet (80 mg total) by mouth at bedtime. Tonia Ghent, MD Taking Active   gabapentin (NEURONTIN) 300 MG capsule [id number] No Take one capsule by mouth every morning and 2 capsules by mouth at bedtime. Tonia Ghent, MD Taking Active   glipiZIDE (GLUCOTROL) 5 MG tablet [id number] No Take 2 tablets by mouth in the morning and 1 tablet by mouth at night. Tonia Ghent, MD Taking Active   glucose blood [facility] ULTRA) test strip [id number]  USE 1 STRIP TO CHECK GLUCOSE ONCE DAILY Tonia Ghent, MD  Active   insulin detemir (LEVEMIR FLEXTOUCH) 100 UNIT/ML FlexPen [id number] No INJECT UP TO 20-35 UNITS SUBCUTANEOUSLY ONCE DAILY Tonia Ghent, MD Taking Active   Insulin Pen Needle (RELION PEN NEEDLE 31G/8MM) 31G X 8 MM MISC [id number] No  USE TO INJECT LEVEMIR DAILY Tonia Ghent, MD Taking Active   lisinopril (ZESTRIL) 5 MG tablet [id number] No Take 3-4 tablets (15-20 mg total) by mouth daily. Tonia Ghent, MD Taking Active   metFORMIN (GLUCOPHAGE) 1000 MG tablet [id number] No Take 0.5 tablets (500 mg total) by mouth daily. Tonia Ghent, MD Taking Active   pantoprazole (PROTONIX) 40 MG tablet [id number] No Take 1 tablet by mouth twice daily Tonia Ghent, MD Taking Active   vitamin C (ASCORBIC ACID) 500 MG tablet [id number] No Take 500 mg by mouth daily. [provider] Taking Active  Patient Active Problem List   Diagnosis Date Noted  . Healthcare maintenance 01/10/2020  . Lower back pain 09/27/2019  . Thumb pain 06/25/2019  . Medicare annual wellness visit, subsequent 03/12/2018  . Advance care planning 03/12/2018  . Shoulder pain 02/03/2017  . Edema 11/02/2016  . Diabetes mellitus with microalbuminuria ([place]) 06/10/2016  . HLD (hyperlipidemia) 06/10/2016  . DM type 2 with diabetic peripheral neuropathy ([place])   . HTN (hypertension)   . Accident caused by farm tractor 01/15/2016  . Colon polyps 06/24/2013    Immunization History  Administered Date(s) Administered  . Fluad Quad(high Dose 65+) 01/05/2019, 01/09/2020  . Influenza,inj,Quad PF,6+ Mos 02/20/2013, 01/03/2014, 02/05/2015, 01/15/2016, 03/26/2016, 02/02/2017  . Influenza-Unspecified 04/13/2010, 01/11/2018  . PFIZER(Purple Top)SARS-COV-2 Vaccination 05/23/2019, 06/17/2019  . Pneumococcal Conjugate-13 01/06/2012, 01/03/2014  . Pneumococcal Polysaccharide-23 12/19/2012  . Td 05/14/2014  . Zoster 12/19/2014  . Zoster Recombinat (Shingrix) 10/27/2018, 01/31/2019    Conditions to be addressed/monitored:  Hypertension, Hyperlipidemia and Diabetes  Care Plan : [place]  Updates made by Debbora Dus, [place] since 07/18/2020 12:00 AM    Problem: CHL AMB "PATIENT-SPECIFIC PROBLEM"     Long-Range Goal: Disease  Management   Start Date: 07/18/2020  Priority: High  Note:    Current Barriers:  . Unable to achieve control of blood pressure  . Blood glucose elevated due to recent steroid injection  Pharmacist Clinical Goal(s):  Marland Kitchen Patient will adhere to plan to optimize therapeutic regimen for blood pressure as evidenced by report of adherence to recommended medication management changes through collaboration with PharmD and provider.   Interventions: . 1:1 collaboration with Tonia Ghent, MD regarding development and update of comprehensive plan of care as evidenced by provider attestation and co-signature . Inter-disciplinary care team collaboration (see longitudinal plan of care) . Comprehensive medication review performed; medication list updated in electronic medical record  Hypertension (BP goal <140/90) -Uncontrolled - per home BP monitoring elevated -Current treatment:  Amlodipine 2.5 mg - 1 tablet daily  Lisinopril 5 mg - 4 tablets (20 mg) daily -Medications previously tried: swelling with amlodipine 5 mg  -Confirms 4 tablets lisinopril but he ran out 3-4 days ago. -Current home readings: last 7 days   164/95, 79  177/94, 81  156/96, 80  149/100, 79    145/87, 74  188/87, 67 -Current dietary habits: watching salt lately -He replaced Advil with Tylenol since last discussion -Denies hypotensive/hypertensive symptoms -Pt is at [place] today with his wife. She has a mini-stroke. He has not taken any meds today. Asked him to take these as soon as possible. Requested lisinopril refill and dose increase to 30 mg from PCP. -Counseled to monitor BP at home daily, document, and provide log at future appointments -Collaborated with PCP - recommend increase lisinopril to 30 mg daily. CMA follow up call next month for BP log.  Hyperlipidemia: (LDL goal < 100) -Controlled - LDL 19 -Current treatent:  Atorvastatin 80 mg - 1 daily -Refills timely, last filled 04/16/20 90 DS - DUE   -Recommended to continue current medication: CMA adherence review next month.  Diabetes (A1c goal <7%) -Uncontrolled -Current medications:  Levemir - Inject 32 units daily (increased 04/16/20)  Metformin 1000 mg - 1/2 tablet AM and 1/2 tablet PM   Glipizide 5 mg - 2 in the morning and 1 at night  -Medications previously tried: metformin 1000 mg BID (intolerable GI symptoms) -Current home glucose readings - last 7 days below, fasting . fasting glucose: 186, 150,  174, 132, 336 (steroid shot), 320 . post prandial glucose: none  -Denies hypoglycemic/hyperglycemic symptoms -Current meal patterns: low carb, high vegetables -Current exercise: minimal -Counseled to check feet daily and get yearly eye exams -Recommended to continue current medication; CMA call for BG log in 1 week to ensure BG is coming down.   Patient Goals/Self-Care Activities . Patient will:  - take medications as prescribed check glucose daily, document, and provide at future appointments check blood pressure daily, document, and provide at future appointments  Follow Up Plan: The care management team will reach out to the patient again over the next 7 days.       Medication Assistance: None required.  Patient affirms current coverage meets needs.  Patient's preferred pharmacy is:  [place], [place] - [address] [place] [address] Phone: [phone] Fax: [phone]  Care Plan and Follow Up Patient Decision:  Patient agrees to Care Plan and Follow-up.  Debbora Dus, PharmD Clinical Pharmacist [facility] [phone]  Encounter details: CCM Time Spent      Value Time User   Time spent with patient (minutes)  35 07/18/2020 10:34 AM Debbora Dus, [facility]   Time spent performing Chart review  30 07/18/2020 10:34 AM Debbora Dus, [facility]   Total time (minutes)  65 07/18/2020 10:34 AM Debbora Dus, RPH     Moderate to High  Complex Decision Making      Value Time User   Moderate to High complex decision making  Yes 07/18/2020 10:34 AM Debbora Dus, [facility]     CCM Services: This encounter meets complex CCM services and moderate to high decision making.  Prior to outreach and patient consent for Chronic Care Management, I referred this patient for services after reviewing the nominated patient list or from a personal encounter with the patient.  I have personally reviewed this encounter including the documentation in this note and have collaborated with the care management provider regarding care management and care coordination activities to include development and update of the comprehensive care plan. I am certifying that I agree with the content of this note and encounter as supervising physician. Elsie Stain

## 2020-07-18 NOTE — Telephone Encounter (Signed)
Recommend dose increase of lisinopril 20 mg to 30 mg - 1 tablet daily to [employer]. Has been taking 4 tablets daily (20 mg total) for the past 7 weeks and BP is consistently above 150/90 on daily home BP checks, using arm cuff. He replaced Advil daily with Tylenol and has reduced salt intake with minimal improvement in BP.  Current meds:  Amlodipine 2.5 mg - 1 tablet daily  Lisinopril 5 mg - 4 tablets (20 mg) daily  Lab Results  Component Value Date   CREATININE 1.36 05/09/2020   BUN 16 05/09/2020   GFR 49.73 (L) 05/09/2020   GFRNONAA >60 05/17/2016   GFRAA >60 05/17/2016   NA 142 05/09/2020   K 4.3 05/09/2020   CALCIUM 9.5 05/09/2020   CO2 29 05/09/2020   Thanks,  Debbora Dus, PharmD Clinical Pharmacist [facility] at [facility] [phone]

## 2020-07-19 MED ORDER — LISINOPRIL 30 MG PO TABS: 30.0000 mg | ORAL_TABLET | Freq: Every day | ORAL | 1 refills | Status: DC

## 2020-07-19 NOTE — Telephone Encounter (Signed)
Patient notified about dose change on lisinopril and advised to update [place] on BP in about 10 days. Patient agrees to do so.

## 2020-07-19 NOTE — Telephone Encounter (Signed)
Sent- notify pt re: dose change on the tabs.  Would change to 1 of the 30mg  tabs.  Please update me in about 10 days re: his BP.  Thanks.

## 2020-07-23 ENCOUNTER — Other Ambulatory Visit: Payer: Self-pay

## 2020-07-23 ENCOUNTER — Ambulatory Visit (INDEPENDENT_AMBULATORY_CARE_PROVIDER_SITE_OTHER): Payer: Medicare Other | Admitting: Podiatry

## 2020-07-23 DIAGNOSIS — L84 Corns and callosities: Secondary | ICD-10-CM

## 2020-07-23 DIAGNOSIS — B351 Tinea unguium: Secondary | ICD-10-CM | POA: Diagnosis not present

## 2020-07-23 DIAGNOSIS — E0840 Diabetes mellitus due to underlying condition with diabetic neuropathy, unspecified: Secondary | ICD-10-CM | POA: Diagnosis not present

## 2020-07-23 DIAGNOSIS — E08621 Diabetes mellitus due to underlying condition with foot ulcer: Secondary | ICD-10-CM

## 2020-07-23 DIAGNOSIS — L97511 Non-pressure chronic ulcer of other part of right foot limited to breakdown of skin: Secondary | ICD-10-CM | POA: Diagnosis not present

## 2020-07-23 MED ORDER — SILVER SULFADIAZINE 1 % EX CREA: TOPICAL_CREAM | CUTANEOUS | 0 refills | Status: DC

## 2020-07-23 NOTE — Progress Notes (Signed)
  Subjective:  Patient ID: Christian [name], male    DOB: 17-Dec-1940,  MRN: [id number]  Chief Complaint  Patient presents with  . Nail Problem    RFC Nail trim.   . Callouses    Bilateral calluse.    80 y.o. male presents with the above complaint. History confirmed with patient. Denies new known issues.  Objective:  Physical Exam: warm, good capillary refill, nail exam onychomycosis of the toenails, no trophic changes or ulcerative lesions. DP pulses palpable, PT pulses palpable and protective sensation absent. HPKs submet 1 bilat, 5th right, hallux IPJ left. Left hallux ulcer healed. Right 5th MPJ small ulcer noted without warmth/erythema/SOI  Assessment:   1. Diabetes mellitus due to underlying condition with diabetic neuropathy, without long-term current use of insulin ([place])   2. Onychomycosis   3. Callus   4. 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  Onychomycosis, Diabetes and DPN -Debrided nails and calluses given DM and DPN  Procedure: Nail Debridement Type of Debridement: manual, sharp debridement. Instrumentation: Nail nipper, rotary burr. Number of Nails: 10   Procedure: Paring of Lesion Rationale: painful hyperkeratotic lesion Type of Debridement: manual, sharp debridement. Instrumentation: 312 blade Number of Lesions: 2   Ulcer right 5th met -Minimal debridement -Rx silvadene -Silvadene and band-aid applied today -Should ulcer continue consider excision of 5th met head  Return in about 1 month (around 08/22/2020) for ulcer fu right.

## 2020-07-24 ENCOUNTER — Telehealth: Payer: Self-pay

## 2020-07-24 NOTE — Chronic Care Management (AMB) (Addendum)
Chronic Care Management Pharmacy Assistant   Name: Christian [name]  MRN: [id number] DOB: Jan 14, 1941 .  Reason for Encounter: Disease State  Diabetes Review   Conditions to be addressed/monitored: DMII  Recent office visits:  None since last CCM contact  Recent consult visits:  07/23/2020 - Hardie Pulley, [facility] visits:  None in previous 6 months  Medications: Outpatient Encounter Medications as of 07/24/2020  Medication Sig   amLODipine (NORVASC) 2.5 MG tablet Take 1 tablet (2.5 mg total) by mouth daily.   atorvastatin (LIPITOR) 80 MG tablet Take 1 tablet (80 mg total) by mouth at bedtime.   gabapentin (NEURONTIN) 300 MG capsule Take one capsule by mouth every morning and 2 capsules by mouth at bedtime.   glipiZIDE (GLUCOTROL) 5 MG tablet Take 2 tablets by mouth in the morning and 1 tablet by mouth at night.   glucose blood (ONETOUCH ULTRA) test strip USE 1 STRIP TO CHECK GLUCOSE ONCE DAILY   insulin detemir (LEVEMIR FLEXTOUCH) 100 UNIT/ML FlexPen INJECT UP TO 20-35 UNITS SUBCUTANEOUSLY ONCE DAILY   Insulin Pen Needle (RELION PEN NEEDLE 31G/8MM) 31G X 8 MM MISC USE TO INJECT LEVEMIR DAILY   lisinopril (ZESTRIL) 30 MG tablet Take 1 tablet (30 mg total) by mouth daily.   metFORMIN (GLUCOPHAGE) 1000 MG tablet Take 0.5 tablets (500 mg total) by mouth daily.   pantoprazole (PROTONIX) 40 MG tablet Take 1 tablet by mouth twice daily   silver sulfADIAZINE (SILVADENE) 1 % cream Apply pea-sized amount to wound daily.   vitamin C (ASCORBIC ACID) 500 MG tablet Take 500 mg by mouth daily.   No facility-administered encounter medications on file as of 07/24/2020.   Recent Relevant Labs: Lab Results  Component Value Date/Time   HGBA1C 9.5 (A) 04/16/2020 09:27 AM   HGBA1C 9.8 (H) 01/04/2020 08:02 AM   HGBA1C 8.6 (A) 09/25/2019 08:09 AM   HGBA1C 10.5 (H) 10/25/2018 12:49 PM   MICROALBUR 1.6 01/04/2020 08:02 AM   MICROALBUR 0.7 03/07/2018 09:41 AM    Kidney Function Lab  Results  Component Value Date/Time   CREATININE 1.36 05/09/2020 11:23 AM   CREATININE 1.16 03/20/2020 08:15 AM   GFR 49.73 (L) 05/09/2020 11:23 AM   GFRNONAA >60 05/17/2016 06:59 AM   GFRAA >60 05/17/2016 06:59 AM    Current antihyperglycemic regimen:  Levemir - Inject 32 units daily  Metformin 1000 mg - 1/2 tablet AM and 1/2 tablet PM  Glipizide 5 mg - 2 in the morning and 1 at night   Patient verbally confirms he is taking the above medications as directed. Yes  What recent interventions/DTPs have been made to improve glycemic control:    CMA call for BG log in 1 week to ensure BG is coming down - 07/24/2020   Have there been any recent hospitalizations or ED visits since last visit with CPP? No  Patient denies hypoglycemic symptoms, including Pale, Sweaty, Shaky, Hungry, Nervous/irritable and Vision changes  Patient denies hyperglycemic symptoms, including blurry vision, excessive thirst, fatigue, polyuria and weakness  How often are you checking your blood sugar? before breakfast  What are your blood sugars ranging?  Fasting:  07/24/2020   239, 07/23/20  237, 07/22/20  263 ,07/21/20  251, 4/9/222  246  During the week, how often does your blood glucose drop below 70? Never  Are you checking your feet daily/regularly? Yes  Adherence Review: Is the patient currently on a STATIN medication? Yes Is the patient currently on ACE/ARB  medication? Yes Does the patient have >5 day gap between last estimated fill dates?  Yes - statin   Star Rating Drugs:  Medication:  Last Fill: Day Supply Atorvastatin 80mg . 04/16/2020 90ds  Glipizide 5mg .  07/21/2020 90ds  Lisinopril 30mg . 07/19/2020 90ds   The patient also gave me some BP readings and they are :  07/23/2020  151/85 64P  07/22/2020  166/95 66P  07/21/2020  163/93 65P  07/20/2020    180/88 66P  Follow-Up:  Pharmacist Review  Debbora Dus, CPP notified  Avel Sensor [facility] Clinical Pharmacy Assistant [phone]  I have  reviewed the care management and care coordination activities outlined in this encounter and I am certifying that I agree with the content of this note. High BP and BG readings - scheduled CCM follow up to review Aug 12, 2020. Pt has PCP visit 08/08/20.   Debbora Dus, PharmD Clinical Pharmacist [facility] at [facility] [phone]

## 2020-07-27 ENCOUNTER — Ambulatory Visit (INDEPENDENT_AMBULATORY_CARE_PROVIDER_SITE_OTHER): Payer: Medicare Other

## 2020-07-27 ENCOUNTER — Other Ambulatory Visit: Payer: Self-pay

## 2020-07-27 ENCOUNTER — Ambulatory Visit
Admission: EM | Admit: 2020-07-27 | Discharge: 2020-07-27 | Disposition: A | Discharge status: Home / Self Care | Payer: Medicare Other | Attending: Emergency Medicine | Admitting: Emergency Medicine

## 2020-07-27 ENCOUNTER — Encounter: Payer: Self-pay | Admitting: Emergency Medicine

## 2020-07-27 DIAGNOSIS — L03115 Cellulitis of right lower limb: Secondary | ICD-10-CM | POA: Diagnosis not present

## 2020-07-27 DIAGNOSIS — L97411 Non-pressure chronic ulcer of right heel and midfoot limited to breakdown of skin: Secondary | ICD-10-CM

## 2020-07-27 DIAGNOSIS — E11621 Type 2 diabetes mellitus with foot ulcer: Secondary | ICD-10-CM

## 2020-07-27 DIAGNOSIS — E13621 Other specified diabetes mellitus with foot ulcer: Secondary | ICD-10-CM

## 2020-07-27 DIAGNOSIS — M7989 Other specified soft tissue disorders: Secondary | ICD-10-CM | POA: Diagnosis not present

## 2020-07-27 DIAGNOSIS — M2011 Hallux valgus (acquired), right foot: Secondary | ICD-10-CM | POA: Diagnosis not present

## 2020-07-27 DIAGNOSIS — L97519 Non-pressure chronic ulcer of other part of right foot with unspecified severity: Secondary | ICD-10-CM | POA: Diagnosis not present

## 2020-07-27 MED ORDER — CEFTRIAXONE SODIUM 1 G IJ SOLR
1.0000 g | Freq: Once | INTRAMUSCULAR | Status: AC
  Administered 2020-07-27: 1 g via INTRAMUSCULAR

## 2020-07-27 MED ORDER — DOXYCYCLINE HYCLATE 100 MG PO CAPS: 100.0000 mg | ORAL_CAPSULE | Freq: Two times a day (BID) | ORAL | 0 refills | Status: AC

## 2020-07-27 NOTE — ED Triage Notes (Addendum)
Patient c/o RT foot pain x 1 week.   Patient endorses discomfort under RT foot has "been ongoing for the past 2 years".   Patient endorses redness and swelling to foot.   Patient endorses drainage from site but is unaware of color or smell of drainage.   Patient endorses symptoms have been progressively become worst.   Patient took Ibuprofen last night with some relief of symptoms.   History of DM.

## 2020-07-27 NOTE — ED Provider Notes (Signed)
[facility]    CSN: [id number] Arrival date & time: 07/27/20  1228      History   Chief Complaint Chief Complaint  Patient presents with  . Foot Pain    HPI Christian [name] is a 80 y.o. male history of hypertension, hyperlipidemia, DM type II, presenting today for evaluation of right foot injury/infection.  Saw podiatry 3 days ago and had nail trimming/callus debridement, over the past couple days he has had increased pain swelling and redness to his foot as well as to an ulcer that has been present for approximately 2 years to the distal end of his first metatarsal.  In the past 24 hours has also noticed an area developing over his distal fifth metatarsal.  Denies fevers dizziness or lightheadedness.  Has had some pustular drainage out of area on plantar surface near first metatarsal  HPI  Past Medical History:  Diagnosis Date  . Arthritis   . Blood transfusion without reported diagnosis    ? in 2017 after crushing injury   . Cataract    right eye removed   . Crushing injury of pelvic region   . Diabetes mellitus without complication ([place])   . Hyperlipidemia   . Hypertension     Patient Active Problem List   Diagnosis Date Noted  . Healthcare maintenance 01/10/2020  . Lower back pain 09/27/2019  . Thumb pain 06/25/2019  . Medicare annual wellness visit, subsequent 03/12/2018  . Advance care planning 03/12/2018  . Shoulder pain 02/03/2017  . Edema 11/02/2016  . Diabetes mellitus with microalbuminuria ([place]) 06/10/2016  . HLD (hyperlipidemia) 06/10/2016  . DM type 2 with diabetic peripheral neuropathy ([place])   . HTN (hypertension)   . Accident caused by farm tractor 01/15/2016  . Colon polyps 06/24/2013    Past Surgical History:  Procedure Laterality Date  . ACHILLES TENDON SURGERY Right   . APPLICATION OF A-CELL OF BACK N/A 02/10/2016   Procedure: APPLICATION OF A-CELL OF sacral wound;  Surgeon: Loel Lofty Dillingham, DO;  Location: [place] [facility];  Service:  Plastics;  Laterality: N/A;  . APPLICATION OF WOUND VAC N/A 02/10/2016   Procedure: APPLICATION OF WOUND VAC possible;  Surgeon: Loel Lofty Dillingham, DO;  Location: [facility];  Service: Plastics;  Laterality: N/A;  . COLONOSCOPY    . I & D EXTREMITY N/A 01/30/2016   Procedure: IRRIGATION AND DEBRIDEMENT OF THE SACRUM;  Surgeon: Loel Lofty Dillingham, DO;  Location: [facility];  Service: Plastics;  Laterality: N/A;  . INCISION AND DRAINAGE OF WOUND N/A 02/10/2016   Procedure: IRRIGATION AND DEBRIDEMENT sacral WOUND;  Surgeon: Loel Lofty Dillingham, DO;  Location: [place];  Service: Plastics;  Laterality: N/A;  . IR GENERIC HISTORICAL  02/11/2016   IR CATHETER TUBE CHANGE 02/11/2016 Aletta Edouard, MD [facility]  . IR GENERIC HISTORICAL  03/16/2016   IR CATHETER TUBE CHANGE 03/16/2016 Sandi Mariscal, MD [facility]  . IR GENERIC HISTORICAL  05/11/2016   IR CATHETER TUBE CHANGE 05/11/2016 Aletta Edouard, MD [facility]  . POLYPECTOMY    . SACRO-ILIAC PINNING N/A 01/16/2016   Procedure: Jenetta Downer;  Surgeon: Altamese [place], MD;  Location: [place];  Service: Orthopedics;  Laterality: N/A;  . SUPRAPUBIC CATHETER INSERTION     s/p removal 2018  . TONSILLECTOMY    . ULNAR NERVE TRANSPOSITION Right        Home Medications    Prior to Admission medications   Medication Sig Start Date End Date Taking? Authorizing Provider  doxycycline (VIBRAMYCIN) 100 MG capsule Take 1 capsule (100 mg total) by mouth 2 (two) times daily for 7 days. 07/27/20 08/03/20 Yes Shandell Jallow C, PA-C  amLODipine (NORVASC) 2.5 MG tablet Take 1 tablet (2.5 mg total) by mouth daily. 04/16/20   Tonia Ghent, MD  atorvastatin (LIPITOR) 80 MG tablet Take 1 tablet (80 mg total) by mouth at bedtime. 04/16/20   Tonia Ghent, MD  gabapentin (NEURONTIN) 300 MG capsule Take one capsule by mouth every morning and 2 capsules by mouth at bedtime. 03/08/18   Tonia Ghent, MD  glipiZIDE (GLUCOTROL) 5 MG tablet Take 2 tablets by  mouth in the morning and 1 tablet by mouth at night. 04/16/20   Tonia Ghent, MD  glucose blood (ONETOUCH ULTRA) test strip USE 1 STRIP TO CHECK GLUCOSE ONCE DAILY 07/05/20   Tonia Ghent, MD  insulin detemir (LEVEMIR FLEXTOUCH) 100 UNIT/ML FlexPen INJECT UP TO 20-35 UNITS SUBCUTANEOUSLY ONCE DAILY 04/16/20   Tonia Ghent, MD  Insulin Pen Needle (RELION PEN NEEDLE 31G/8MM) 31G X 8 MM MISC USE TO INJECT LEVEMIR DAILY 11/07/19   Tonia Ghent, MD  lisinopril (ZESTRIL) 30 MG tablet Take 1 tablet (30 mg total) by mouth daily. 07/19/20   Tonia Ghent, MD  metFORMIN (GLUCOPHAGE) 1000 MG tablet Take 0.5 tablets (500 mg total) by mouth daily. 04/16/20   Tonia Ghent, MD  pantoprazole (PROTONIX) 40 MG tablet Take 1 tablet by mouth twice daily 02/19/20   Tonia Ghent, MD  silver sulfADIAZINE (SILVADENE) 1 % cream Apply pea-sized amount to wound daily. 07/23/20   Evelina Bucy, DPM  vitamin C (ASCORBIC ACID) 500 MG tablet Take 500 mg by mouth daily.    [provider]    Family History Family History  Problem Relation Age of Onset  . Lymphoma Mother   . CVA Father   . Dementia Father   . Diabetes Neg Hx   . Prostate cancer Neg Hx   . Colon polyps Neg Hx   . Colon cancer Neg Hx   . Rectal cancer Neg Hx   . Stomach cancer Neg Hx   . Esophageal cancer Neg Hx     Social History Social History   Tobacco Use  . Smoking status: Never Smoker  . Smokeless tobacco: Never Used  Substance Use Topics  . Alcohol use: No  . Drug use: No     Allergies   Metformin and related   Review of Systems Review of Systems  Constitutional: Negative for fatigue and fever.  Eyes: Negative for redness, itching and visual disturbance.  Respiratory: Negative for shortness of breath.   Cardiovascular: Negative for chest pain and leg swelling.  Gastrointestinal: Negative for nausea and vomiting.  Musculoskeletal: Positive for arthralgias. Negative for myalgias.  Skin: Positive for  color change and wound. Negative for rash.  Neurological: Negative for dizziness, syncope, weakness, light-headedness and headaches.     Physical Exam Triage Vital Signs ED Triage Vitals  Enc Vitals Group     BP      Pulse      Resp      Temp      Temp src      SpO2      Weight      Height      Head Circumference      Peak Flow      Pain Score      Pain Loc  Pain Edu?      Excl. in [place]?    No data found.  Updated Vital Signs BP 124/75 (BP Location: Left Arm)   Pulse 86   Temp 98.2 F (36.8 C) (Oral)   Resp 15   SpO2 97%   Visual Acuity Right Eye Distance:   Left Eye Distance:   Bilateral Distance:    Right Eye Near:   Left Eye Near:    Bilateral Near:     Physical Exam Vitals and nursing note reviewed.  Constitutional:      Appearance: He is well-developed.     Comments: No acute distress  HENT:     Head: Normocephalic and atraumatic.     Nose: Nose normal.  Eyes:     Conjunctiva/sclera: Conjunctivae normal.  Cardiovascular:     Rate and Rhythm: Normal rate.  Pulmonary:     Effort: Pulmonary effort is normal. No respiratory distress.  Abdominal:     General: There is no distension.  Musculoskeletal:        General: Normal range of motion.     Cervical back: Neck supple.     Comments: Right foot: Erythema and swelling noted to dorsum of foot especially on lateral aspect, dorsalis pedis 2+, plantar surface with large area of swelling with central open wound draining pustular discharge to distal first metatarsal, distal fifth metatarsal with slight callusing and underlying pustular discoloration  Skin:    General: Skin is warm and dry.  Neurological:     Mental Status: He is alert and oriented to person, place, and time.      UC Treatments / Results  Labs (all labs ordered are listed, but only abnormal results are displayed) Labs Reviewed - No data to display  EKG   Radiology DG Foot Complete Right  Result Date: 07/27/2020 CLINICAL  DATA:  Diabetic foot ulcers and infection at the distal first and 5th metatarsals. EXAM: RIGHT FOOT COMPLETE - 3+ VIEW COMPARISON:  None. FINDINGS: Moderate hallux valgus deformity associated with large osteophytes of the distal first metatarsal. Soft tissue swelling associated small ulceration is identified adjacent to the 5th metatarsophalangeal joint. There is no acute fracture or subluxation. No cortical lucencies to indicate osteomyelitis. Large plantar and Achilles spurs involve the calcaneus. IMPRESSION: 1. No evidence for acute osseous abnormality. 2. Soft tissue swelling and ulceration adjacent to the 5th metatarsophalangeal joint. 3. Significant hallux valgus deformity with large osteophytes at the distal first metatarsal. Electronically Signed   By: Nolon Nations M.D.   On: 07/27/2020 14:10    Procedures Procedures (including critical care time)  Medications Ordered in UC Medications  cefTRIAXone (ROCEPHIN) injection 1 g (1 g Intramuscular Given 07/27/20 1403)    Initial Impression / Assessment and Plan / UC Course  I have reviewed the triage vital signs and the nursing notes.  Pertinent labs & imaging results that were available during my care of the patient were reviewed by me and considered in my medical decision making (see chart for details).     80 year old male with plantar wounds concerning for infection in setting of diabetes.  No signs of osteomyelitis on imaging.  Providing Rocephin prior to discharge and continuing on doxycycline, follow-up with podiatry this week if able, if symptoms progressing or worsening please follow-up in emergency room.  Discussed strict return precautions. Patient verbalized understanding and is agreeable with plan.  Final Clinical Impressions(s) / UC Diagnoses   Final diagnoses:  Diabetic ulcer of right midfoot associated with type 2  diabetes mellitus, limited to breakdown of skin ([place])  Cellulitis of right foot     Discharge  Instructions     We gave you an injection of Rocephin Continue with doxycycline twice daily for 1 week Elevate foot Keep area clean and dry Follow-up in the emergency room if not seeing any improvement or worsening in the next 48 hours Follow-up with podiatry this week    ED Prescriptions    Medication Sig Dispense Auth. Provider   doxycycline (VIBRAMYCIN) 100 MG capsule Take 1 capsule (100 mg total) by mouth 2 (two) times daily for 7 days. 14 capsule Thresia Ramanathan, [place] C, PA-C     PDMP not reviewed this encounter.   Janith Lima, PA-C 07/27/20 1429

## 2020-07-27 NOTE — Discharge Instructions (Addendum)
We gave you an injection of Rocephin Continue with doxycycline twice daily for 1 week Elevate foot Keep area clean and dry Follow-up in the emergency room if not seeing any improvement or worsening in the next 48 hours Follow-up with podiatry this week

## 2020-07-30 ENCOUNTER — Ambulatory Visit: Payer: Medicare Other | Admitting: Podiatry

## 2020-08-08 ENCOUNTER — Ambulatory Visit (INDEPENDENT_AMBULATORY_CARE_PROVIDER_SITE_OTHER): Payer: Medicare Other | Admitting: Family Medicine

## 2020-08-08 ENCOUNTER — Encounter: Payer: Self-pay | Admitting: Family Medicine

## 2020-08-08 ENCOUNTER — Other Ambulatory Visit: Payer: Self-pay

## 2020-08-08 VITALS — BP 126/80 | HR 70 | Temp 97.5°F | Ht 74.0 in | Wt 217.0 lb

## 2020-08-08 DIAGNOSIS — L97509 Non-pressure chronic ulcer of other part of unspecified foot with unspecified severity: Secondary | ICD-10-CM | POA: Diagnosis not present

## 2020-08-08 DIAGNOSIS — L97501 Non-pressure chronic ulcer of other part of unspecified foot limited to breakdown of skin: Secondary | ICD-10-CM | POA: Diagnosis not present

## 2020-08-08 DIAGNOSIS — E11621 Type 2 diabetes mellitus with foot ulcer: Secondary | ICD-10-CM

## 2020-08-08 DIAGNOSIS — E1142 Type 2 diabetes mellitus with diabetic polyneuropathy: Secondary | ICD-10-CM | POA: Diagnosis not present

## 2020-08-08 LAB — CBC WITH DIFFERENTIAL/PLATELET
Basophils Absolute: 0 10*3/uL (ref 0.0–0.1)
Basophils Relative: 0.5 % (ref 0.0–3.0)
Eosinophils Absolute: 0.1 10*3/uL (ref 0.0–0.7)
Eosinophils Relative: 1.7 % (ref 0.0–5.0)
HCT: 38.7 % — ABNORMAL LOW (ref 39.0–52.0)
Hemoglobin: 12.6 g/dL — ABNORMAL LOW (ref 13.0–17.0)
Lymphocytes Relative: 31.1 % (ref 12.0–46.0)
Lymphs Abs: 2.5 10*3/uL (ref 0.7–4.0)
MCHC: 32.6 g/dL (ref 30.0–36.0)
MCV: 88.9 fl (ref 78.0–100.0)
Monocytes Absolute: 0.5 10*3/uL (ref 0.1–1.0)
Monocytes Relative: 6.4 % (ref 3.0–12.0)
Neutro Abs: 4.8 10*3/uL (ref 1.4–7.7)
Neutrophils Relative %: 60.3 % (ref 43.0–77.0)
Platelets: 187 10*3/uL (ref 150.0–400.0)
RBC: 4.35 Mil/uL (ref 4.22–5.81)
RDW: 14.4 % (ref 11.5–15.5)
WBC: 8 10*3/uL (ref 4.0–10.5)

## 2020-08-08 LAB — BASIC METABOLIC PANEL
BUN: 18 mg/dL (ref 6–23)
CO2: 26 mEq/L (ref 19–32)
Calcium: 9.3 mg/dL (ref 8.4–10.5)
Chloride: 107 mEq/L (ref 96–112)
Creatinine, Ser: 1.07 mg/dL (ref 0.40–1.50)
GFR: 66.2 mL/min (ref >60.00)
Glucose, Bld: 135 mg/dL — ABNORMAL HIGH (ref 70–99)
Potassium: 4.7 mEq/L (ref 3.5–5.1)
Sodium: 142 mEq/L (ref 135–145)

## 2020-08-08 LAB — HEMOGLOBIN A1C: Hgb A1c MFr Bld: 9.1 % — ABNORMAL HIGH (ref 4.6–6.5)

## 2020-08-08 LAB — SEDIMENTATION RATE: Sed Rate: 35 mm/hr — ABNORMAL HIGH (ref 0–20)

## 2020-08-08 MED ORDER — LEVEMIR FLEXTOUCH 100 UNIT/ML ~~LOC~~ SOPN: PEN_INJECTOR | SUBCUTANEOUS | 3 refills | Status: DC

## 2020-08-08 MED ORDER — METFORMIN HCL 1000 MG PO TABS: 1000.0000 mg | ORAL_TABLET | Freq: Two times a day (BID) | ORAL | Status: DC

## 2020-08-08 NOTE — Progress Notes (Signed)
This visit occurred during the SARS-CoV-2 public health emergency.  Safety protocols were in place, including screening questions prior to the visit, additional usage of staff PPE, and extensive cleaning of exam room while observing appropriate contact time as indicated for disinfecting solutions.  H/o DM.  Foot lesion.  Back injection helped his pain but sugar was elevated.  He increased his metformin back to 1 tab BID and inc insulin to 36 units levemir.  Sugar now ~110-150s now.  R foot lesion, had seen podiatry prev.  He has been using silvadene cream in the meantime.  UC visit 07/27/20 d/w pt, started on abx at that point.  Pain is improved in the meantime.  Off abx currently.  Some drainage from the area, of unclear quality.  He feels like he is better than UC visit on 07/27/20.   Xray recently done:  IMPRESSION:  1. No evidence for acute osseous abnormality.  2. Soft tissue swelling and ulceration adjacent to the 5th  metatarsophalangeal joint.  3. Significant hallux valgus deformity with large osteophytes at the  distal first metatarsal. Meds, vitals, and allergies reviewed.  ROS: Per HPI unless specifically indicated in ROS section  R foot skin is well perfused.  Right first metatarsal, distal plantar side, with 2 x 1 cm callus without ulceration. Plantar side of distal fifth metatarsal with 3 x 3 callus with 1 x 1 cm central ulceration.  Superficial.  No discharge.  No spreading erythema.  Normal dorsalis pedis pulse.

## 2020-08-08 NOTE — Patient Instructions (Signed)
Use a hydrocolloid dressing on the ulcer and then a soft dressing that isn't water tight on top.  Stay off your foot as much as you can.  Call podiatry about follow up.  Go to the lab on the way out.   If you have mychart we'll likely use that to update you.    Take care.  Glad to see you.

## 2020-08-11 DIAGNOSIS — E11621 Type 2 diabetes mellitus with foot ulcer: Secondary | ICD-10-CM | POA: Insufficient documentation

## 2020-08-11 NOTE — Assessment & Plan Note (Signed)
Does not appear infected.  Appears to be limited to the skin.  He has some macerated skin around the age of the ulcer.  Discussed options.  Cover with hydrocolloid dressing to help with maceration.  I want him to follow with podiatry.  See after visit summary. Check routine labs today.  Previous imaging discussed with patient.  Control of hyperglycemia is likely going to be the most important thing going forward, along with offloading.  Advised patient to elevate/offload is much as possible.  He agrees with plan.

## 2020-08-12 ENCOUNTER — Ambulatory Visit (INDEPENDENT_AMBULATORY_CARE_PROVIDER_SITE_OTHER): Payer: Medicare Other

## 2020-08-12 ENCOUNTER — Other Ambulatory Visit: Payer: Self-pay

## 2020-08-12 ENCOUNTER — Telehealth: Payer: Self-pay

## 2020-08-12 DIAGNOSIS — I10 Essential (primary) hypertension: Secondary | ICD-10-CM

## 2020-08-12 DIAGNOSIS — E1142 Type 2 diabetes mellitus with diabetic polyneuropathy: Secondary | ICD-10-CM

## 2020-08-12 NOTE — Progress Notes (Addendum)
Chronic Care Management  Pharmacy Note  08/12/2020 Name:  Christian [name] MRN:  [id number] DOB:  17-Jan-1941  Subjective: Christian [name] is an 81 y.o. year old male who is a primary patient of [name], [name], MD.  The CCM team was consulted for assistance with disease management and care coordination needs.    Engaged with patient by telephone for follow up visit in response to provider referral for pharmacy case management and/or care coordination services.   Consent to Services:  The patient was given information about Chronic Care Management services, agreed to services, and gave verbal consent prior to initiation of services.  Please see initial visit note for detailed documentation.   Patient Care Team: Tonia Ghent, MD as PCP - General (Family Medicine) Debbora Dus, [facility] as Pharmacist (Pharmacist)  Recent office visits: 08/11/20 - PCP lab note - A1c 9.1%, improving  08/08/20 - PCP, ED follow up, diabetic foot ulcer - He increased his metformin back to 1 tab BID and inc insulin to 36 units levemir.  Sugar now ~110-150s now. Use a hydrocolloid dressing on the ulcer and then a soft dressing that isn't water tight on top.  Stay off your foot as much as you can.  Call podiatry about follow up.  07/23/2020 - Hardie Pulley, Podiatry    07/18/20 - CCM - Increase lisinopril to 30 mg daily  05/24/20 - CCM - elevated home BP, reduce salt, avoid NSAIDs 05/20/20 - Back pain - MRI, referral placed for Dr. Ellene Route  Recent consult visits: 07/03/20 - Neuro - note unavailable  Hospital visits: 07/27/20 - ED/Urgent care - Diabetic foot ulcer  Objective:  Lab Results  Component Value Date   CREATININE 1.07 08/08/2020   BUN 18 08/08/2020   GFR 66.20 08/08/2020   GFRNONAA >60 05/17/2016   GFRAA >60 05/17/2016   NA 142 08/08/2020   K 4.7 08/08/2020   CALCIUM 9.3 08/08/2020   CO2 26 08/08/2020   GLUCOSE 135 (H) 08/08/2020    Lab Results  Component Value Date/Time   HGBA1C 9.1 (H) 08/08/2020  10:03 AM   HGBA1C 9.5 (A) 04/16/2020 09:27 AM   HGBA1C 9.8 (H) 01/04/2020 08:02 AM   GFR 66.20 08/08/2020 10:03 AM   GFR 49.73 (L) 05/09/2020 11:23 AM   MICROALBUR 1.6 01/04/2020 08:02 AM   MICROALBUR 0.7 03/07/2018 09:41 AM    Last diabetic Eye exam:  Lab Results  Component Value Date/Time   HMDIABEYEEXA No Retinopathy 11/21/2019 12:00 AM    Last diabetic Foot exam: 08/08/20  PCP diabetic foot ulcer  Lab Results  Component Value Date   CHOL 85 01/04/2020   HDL 27.70 (L) 01/04/2020   LDLDIRECT 19.0 01/04/2020   TRIG 391.0 (H) 01/04/2020   CHOLHDL 3 01/04/2020    Hepatic Function Latest Ref Rng & Units 01/04/2020 03/01/2018 11/02/2016  Total Protein 6.0 - 8.3 g/dL 7.3 7.3 7.0  Albumin 3.5 - 5.2 g/dL 4.2 4.3 4.3  AST 0 - 37 U/L '26 29 22  ' ALT 0 - 53 U/L '19 23 21  ' Alk Phosphatase 39 - 117 U/L 64 74 79  Total Bilirubin 0.2 - 1.2 mg/dL 0.9 0.8 0.6    Lab Results  Component Value Date/Time   TSH 3.337 03/06/2016 03:45 AM    CBC Latest Ref Rng & Units 08/08/2020 11/02/2016 06/03/2016  WBC 4.0 - 10.5 K/uL 8.0 7.0 8.4  Hemoglobin 13.0 - 17.0 g/dL 12.6(L) 12.5(L) 12.3(L)  Hematocrit 39.0 - 52.0 % 38.7(L) 38.5(L) 36.4(L)  Platelets 150.0 - 400.0 K/uL 187.0 177.0 239.0   Clinical ASCVD: No  The ASCVD Risk score Mikey Bussing DC Jr., et al., 2013) failed to calculate for the following reasons:   The valid total cholesterol range is 130 to 320 mg/dL    Depression screen [facility] 2/9 04/16/2020 03/08/2018 06/03/2016  Decreased Interest 0 0 0  Down, Depressed, Hopeless 0 0 0  PHQ - 2 Score 0 0 0    Social History   Tobacco Use  Smoking Status Never Smoker  Smokeless Tobacco Never Used   BP Readings from Last 3 Encounters:  08/08/20 126/80  07/27/20 124/75  05/20/20 126/80   Pulse Readings from Last 3 Encounters:  08/08/20 70  07/27/20 86  05/20/20 89   Wt Readings from Last 3 Encounters:  08/08/20 217 lb (98.4 kg)  05/20/20 217 lb (98.4 kg)  04/16/20 218 lb 3.2 oz (99 kg)   BMI  Readings from Last 3 Encounters:  08/08/20 27.86 kg/m  05/20/20 27.86 kg/m  04/16/20 28.02 kg/m    Assessment/Interventions: Review of patient past medical history, allergies, medications, health status, including review of consultants reports, laboratory and other test data, was performed as part of comprehensive evaluation and provision of chronic care management services.   SDOH:  (Social Determinants of Health) assessments and interventions performed: Yes SDOH Interventions   Flowsheet Row Most Recent Value  SDOH Interventions   Financial Strain Interventions Intervention Not Indicated  [Medications affordable]     SDOH Screenings   Alcohol Screen: Not on file  Depression (PHQ2-9): Low Risk   . PHQ-2 Score: 0  Financial Resource Strain: Low Risk   . Difficulty of Paying Living Expenses: Not very hard  Food Insecurity: Not on file  Housing: Not on file  Physical Activity: Not on file  Social Connections: Not on file  Stress: Not on file  Tobacco Use: Low Risk   . Smoking Tobacco Use: Never Smoker  . Smokeless Tobacco Use: Never Used  Transportation Needs: Not on file    [place]  No Known Allergies  Medications Reviewed Today    Reviewed by Debbora Dus, [facility] (Pharmacist) on 08/12/20 at 1437  Med List Status: <None>  Medication Order Taking? Sig Documenting Provider Last Dose Status Informant  amLODipine (NORVASC) 2.5 MG tablet [id number] Yes Take 1 tablet (2.5 mg total) by mouth daily. Tonia Ghent, MD Taking Active   atorvastatin (LIPITOR) 80 MG tablet [id number] Yes Take 1 tablet (80 mg total) by mouth at bedtime. Tonia Ghent, MD Taking Active   gabapentin (NEURONTIN) 300 MG capsule [id number]  Take one capsule by mouth every morning and 2 capsules by mouth at bedtime. Tonia Ghent, MD  Active   glipiZIDE (GLUCOTROL) 5 MG tablet [id number] Yes Take 2 tablets by mouth in the morning and 1 tablet by mouth at night. Tonia Ghent, MD Taking Active    glucose blood [facility] ULTRA) test strip [id number]  USE 1 STRIP TO CHECK GLUCOSE ONCE DAILY Tonia Ghent, MD  Active   insulin detemir (LEVEMIR FLEXTOUCH) 100 UNIT/ML FlexPen [id number] Yes INJECT UP TO 20-36 UNITS SUBCUTANEOUSLY ONCE DAILY Tonia Ghent, MD Taking Active   Insulin Pen Needle (RELION PEN NEEDLE 31G/8MM) 31G X 8 MM MISC [id number]  USE TO INJECT LEVEMIR DAILY Tonia Ghent, MD  Active   lisinopril (ZESTRIL) 30 MG tablet [id number] Yes Take 1 tablet (30 mg total) by mouth daily. Tonia Ghent, MD Taking Active   metFORMIN (GLUCOPHAGE)  1000 MG tablet [id number] Yes Take 1 tablet (1,000 mg total) by mouth 2 (two) times daily with a meal. Tonia Ghent, MD Taking Active   pantoprazole (PROTONIX) 40 MG tablet [id number]  Take 1 tablet by mouth twice daily Tonia Ghent, MD  Active   silver sulfADIAZINE (SILVADENE) 1 % cream [id number]  Apply pea-sized amount to wound daily. Evelina Bucy, DPM  Active           Patient Active Problem List   Diagnosis Date Noted  . Diabetic foot ulcer ([place]) 08/11/2020  . Healthcare maintenance 01/10/2020  . Lower back pain 09/27/2019  . Thumb pain 06/25/2019  . Medicare annual wellness visit, subsequent 03/12/2018  . Advance care planning 03/12/2018  . Shoulder pain 02/03/2017  . Edema 11/02/2016  . Diabetes mellitus with microalbuminuria ([place]) 06/10/2016  . HLD (hyperlipidemia) 06/10/2016  . DM type 2 with diabetic peripheral neuropathy ([place])   . HTN (hypertension)   . Accident caused by farm tractor 01/15/2016  . Colon polyps 06/24/2013    Immunization History  Administered Date(s) Administered  . Fluad Quad(high Dose 65+) 01/05/2019, 01/09/2020  . Influenza,inj,Quad PF,6+ Mos 02/20/2013, 01/03/2014, 02/05/2015, 01/15/2016, 03/26/2016, 02/02/2017  . Influenza-Unspecified 04/13/2010, 01/11/2018  . PFIZER(Purple Top)SARS-COV-2 Vaccination 05/23/2019, 06/17/2019  . Pneumococcal Conjugate-13 01/06/2012, 01/03/2014  .  Pneumococcal Polysaccharide-23 12/19/2012  . Td 05/14/2014  . Zoster 12/19/2014  . Zoster Recombinat (Shingrix) 10/27/2018, 01/31/2019    Conditions to be addressed/monitored:  Hypertension, Hyperlipidemia and Diabetes  Care Plan : Between  Updates made by Debbora Dus, [place] since 08/12/2020 12:00 AM    Problem: CHL AMB "PATIENT-SPECIFIC PROBLEM"     Long-Range Goal: Disease Management   Start Date: 07/18/2020  Priority: High  Note:    Current Barriers:  . Unable to achieve control of blood pressure and blood sugar  Pharmacist Clinical Goal(s):  Marland Kitchen Patient will adhere to plan to optimize therapeutic regimen for blood pressure as evidenced by report of adherence to recommended medication management changes through collaboration with PharmD and provider.   Interventions: . 1:1 collaboration with Tonia Ghent, MD regarding development and update of comprehensive plan of care as evidenced by provider attestation and co-signature . Inter-disciplinary care team collaboration (see longitudinal plan of care) . Comprehensive medication review performed; medication list updated in electronic medical record  Hypertension (BP goal <140/90) -Query uncontrolled - clinic readings within goal, but home readings elevated  -Current treatment:  Amlodipine 2.5 mg - 1 tablet daily  Lisinopril 30 mg - 1 tablets daily -Medications previously tried: swelling with amlodipine 5 mg  -Patient confirms daily adherence  -Uses an arm cuff for at home monitoring -Current home readings: checks daily in morning before coffee or medications   08/06/20   157/92, 84  08/07/20   158/72, 77  08/08/20   169/94, 77  08/09/20   159/96, 78  07/31/20    171/98   08/11/20      160/83, 70  08/12/20       156/87, 71 -Current dietary habits: trying to reduce salt, sometimes still adds to food  -Takes night meds at 8:45 PM, morning meds at breakfast, before coffee - 8 AM  -He seldom uses Advil, tries  to use Tylenol. Reports significant reduction Advil use over past few months. -Denies hypotensive/hypertensive symptoms -Continue to monitor BP at home daily, document, and provide log at future appointments -Recommended continue current medications; Bring home monitor in for accuracy check.   Hyperlipidemia: (LDL  goal < 100) -Controlled - LDL 19 -Current treatent:  Atorvastatin 80 mg - 1 daily -Refills timely, last filled 04/16/20 90 DS - DUE  -Patient reports daily adherence to 1 tablet atorvastatin 80 mg. His current bottle is dated 04/16/20 and is about half full. No clear reason for excess supply.  -Recommended to continue current medication  Diabetes (A1c goal <7%) -Uncontrolled  -A1c 9.1% -Pt received steroid injection last month and had high BG ~300s for several weeks. A1c likely lower on next check. -Current medications:  Levemir - Inject 35 units daily (patient confirms 35 units daily)  Metformin 1000 mg - 1 tablet twice daily (patient confirms still taking higher dose - 2000 mg/day)  Glipizide 5 mg - 2 in the morning and 1 at night  -Medications previously tried: none reported -Current home glucose readings - last 7 days below, fasting . fasting glucose: 124 (4/24), 212 (4/25) - could not figure out why, 137 (4/26), 204 (4/27), 173 (4/28), 148 (4/29), 130 (4/30), 121 (5/1), 135 (5/2)  . post prandial glucose: none  -Denies hypoglycemic/hyperglycemic symptoms -Current meal patterns: beach last week, came back Saturday 4/30, reports he tried to maintain small portions when eating out -Soaking foot in epsom salt 30 minutes most days. Reports no pain, improving. Wearing wide foot tennis shoes.  -Current exercise: minimal -Counseled to check feet daily and get yearly eye exams - up to date  -We discussed trial Trulicity - He is open to considering this if BG remains elevated.  -Recommended to continue current medication; Follow up next month to consider Trulicity.   Patient  Goals/Self-Care Activities . Patient will:  - take medications as prescribed check glucose daily, document, and provide at future appointments check blood pressure daily, document, and provide at future appointments  Follow Up Plan: Telephone appointment scheduled for: 30 days     Medication Assistance: None required.  Patient affirms current coverage meets needs.   Star Rating Drugs: Medication:                Last Fill:         Day Supply Atorvastatin 41m      04/16/2020          90ds  - past due Glipizide 568m            07/21/2020        90ds     Lisinopril 3020m         07/19/2020          90ds Metformin 1000 mg    04/08/2020      90ds  - past due   [facility] [id number] [place] [id number]: [phone]: [phone] Plan and Follow Up Patient Decision:  Patient agrees to Care Plan and Follow-up.  MicDebbora DusharmD Clinical Pharmacist [facility] at [phone] details: CCM Time Spent      Value Time User   Time spent with patient (minutes)  20 08/12/2020  2:40 PM AdaDebbora DusPHPioneer Memorial HospitalTime spent performing Chart review  30 08/12/2020  2:40 PM AdaDebbora DusPHEye Surgery Center LLCTotal time (minutes)  50 08/12/2020  2:40 PM AdaDebbora DusPH     Moderate to High Complex Decision Making      Value Time User   Moderate to High complex decision making  No 08/12/2020  2:40 PM Debbora Dus, [facility]     CCM Services: This encounter meets routine CCM services.  Prior to outreach and patient consent for Chronic Care Management, I referred this patient for services after reviewing the nominated patient list or from a personal encounter with the patient.  I have personally reviewed this encounter including the documentation in this note and have collaborated with the care management provider regarding care management and care coordination  activities to include development and update of the comprehensive care plan. I am certifying that I agree with the content of this note and encounter as supervising physician.

## 2020-08-12 NOTE — Patient Instructions (Signed)
Dear Christian [name],  Below is a summary of the goals we discussed during our follow up appointment on Aug 12, 2020. Please contact me anytime with questions or concerns.   Visit Information  Patient Care Plan: CCM Pharmacy Care Plan    Problem Identified: CHL AMB "PATIENT-SPECIFIC PROBLEM"     Long-Range Goal: Disease Management   Start Date: 07/18/2020  Priority: High  Note:    Current Barriers:  . Unable to achieve control of blood pressure and blood sugar  Pharmacist Clinical Goal(s):  Marland Kitchen Patient will adhere to plan to optimize therapeutic regimen for blood pressure as evidenced by report of adherence to recommended medication management changes through collaboration with PharmD and provider.   Interventions: . 1:1 collaboration with Tonia Ghent, MD regarding development and update of comprehensive plan of care as evidenced by provider attestation and co-signature . Inter-disciplinary care team collaboration (see longitudinal plan of care) . Comprehensive medication review performed; medication list updated in electronic medical record  Hypertension (BP goal <140/90) -Query uncontrolled - clinic readings within goal, but home readings elevated  -Current treatment:  Amlodipine 2.5 mg - 1 tablet daily  Lisinopril 30 mg - 1 tablets daily -Medications previously tried: swelling with amlodipine 5 mg  -Patient confirms daily adherence  -Uses an arm cuff for at home monitoring -Current home readings: checks daily in morning before coffee or medications   08/06/20   157/92, 84  08/07/20   158/72, 77  08/08/20   169/94, 77  08/09/20   159/96, 78  07/31/20    171/98   08/11/20      160/83, 70  08/12/20       156/87, 71 -Current dietary habits: trying to reduce salt, sometimes still adds to food  -Takes night meds at 8:45 PM, morning meds at breakfast, before coffee - 8 AM  -He seldom uses Advil, tries to use Tylenol. Reports significant reduction Advil use over past few  months. -Denies hypotensive/hypertensive symptoms -Continue to monitor BP at home daily, document, and provide log at future appointments -Recommended continue current medications; Bring home monitor in for accuracy check.   Hyperlipidemia: (LDL goal < 100) -Controlled - LDL 19 -Current treatent:  Atorvastatin 80 mg - 1 daily -Refills timely, last filled 04/16/20 90 DS - DUE  -Patient reports daily adherence to 1 tablet atorvastatin 80 mg. His current bottle is dated 04/16/20 and is about half full. No clear reason for excess supply.  -Recommended to continue current medication  Diabetes (A1c goal <7%) -Uncontrolled  -A1c 9.1% -Pt received steroid injection last month and had high BG ~300s for several weeks. A1c likely lower on next check. -Current medications:  Levemir - Inject 35 units daily (patient confirms 35 units daily)  Metformin 1000 mg - 1 tablet twice daily (patient confirms still taking higher dose - 2000 mg/day)  Glipizide 5 mg - 2 in the morning and 1 at night  -Medications previously tried: none reported -Current home glucose readings - last 7 days below, fasting . fasting glucose: 124 (4/24), 212 (4/25) - could not figure out why, 137 (4/26), 204 (4/27), 173 (4/28), 148 (4/29), 130 (4/30), 121 (5/1), 135 (5/2)  . post prandial glucose: none  -Denies hypoglycemic/hyperglycemic symptoms -Current meal patterns: beach last week, came back Saturday 4/30, reports he tried to maintain small portions when eating out -Soaking foot in epsom salt 30 minutes most days. Reports no pain, improving. Wearing wide foot tennis shoes.  -Current exercise: minimal -Counseled to check  feet daily and get yearly eye exams - up to date  -We discussed trial Trulicity - He is open to considering this if BG remains elevated.  -Recommended to continue current medication; Follow up next month to consider Trulicity.   Patient Goals/Self-Care Activities . Patient will:  - take medications as  prescribed check glucose daily, document, and provide at future appointments check blood pressure daily, document, and provide at future appointments  Follow Up Plan: Telephone appointment scheduled for: 30 days      The patient verbalized understanding of instructions, educational materials, and care plan provided today and agreed to receive a mailed copy of patient instructions, educational materials, and care plan.   Debbora Dus, PharmD Clinical Pharmacist [facility] at [facility] [phone]   Basics of Medicine Management Taking your medicines correctly is an important part of managing or preventing medical problems. Make sure you know what disease or condition your medicine is treating, and how and when to take it. If you do not take your medicine correctly, it may not work well and may cause unpleasant side effects, including serious health problems. What should I do when I am taking medicines?  Read all the labels and inserts that come with your medicines. Review the information often.  Talk with your pharmacist if you get a refill and notice a change in the size, color, or shape of your medicines.  Know the potential side effects for each medicine that you take.  Try to get all your medicines from the same pharmacy. The pharmacist will have all your information and will understand how your medicines will affect each other (interact).  Tell your health care provider about all your medicines, including over-the-counter medicines, vitamins, and herbal or dietary supplements. He or she will make sure that nothing will interact with any of your prescribed medicines.   How can I take my medicines safely?  Take medicines only as told by your health care provider. ? Do not take more of your medicine than instructed. ? Do not take anyone else's medicines. ? Do not share your medicines with others. ? Do not stop taking your medicines unless your health care provider  tells you to do so. ? You may need to avoid alcohol or certain foods or liquids when taking certain medicines. Follow your health care provider's instructions.  Do not split, mash, or chew your medicines unless your health care provider tells you to do so. Tell your health care provider if you have trouble swallowing your medicines.  For liquid medicine, use the dosing container that was provided. How should I organize my medicines? Know your medicines  Know what each of your medicines looks like. This includes size, color, and shape. Tell your health care provider if you are having trouble recognizing all the medicines that you are taking.  If you cannot tell your medicines apart because they look similar, keep them in original bottles.  If you cannot read the labels on the bottles, tell your pharmacist to put your medicines in containers with large print.  Review your medicines and your schedule with family members, a friend, or a caregiver. Use a pill organizer  Use a tool to organize your medicine schedule. Tools include a weekly pillbox, a written chart, a notebook, or a calendar.  Your tool should help you remember the following things about each medicine: ? The name of the medicine. ? The amount (dose) to take. ? The schedule. This is the day and time  the medicine should be taken. ? The appearance. This includes color, shape, size, and stamp. ? How to take your medicines. This includes instructions to take them with food, without food, with fluids, or with other medicines.  Create reminders for taking your medicines. Use sticky notes, or alarms on your watch, mobile device, or phone calendar.  You may choose to use a more advanced management system. These systems have storage, alarms, and visual and audio prompts.  Some medicines can be taken on an "as-needed" basis. These include medicines for nausea or pain. If you take an as-needed medicine, write down the name and dose, as  well as the date and time that you took it.   How should I plan for travel?  Take your pillbox, medicines, and organization system with you when traveling.  Have your medicines refilled before you travel. This will ensure that you do not run out of your medicines while you are away from home.  Always carry an updated list of your medicines with you. If there is an emergency, a first responder can quickly see what medicines you are taking.  Do not pack your medicines in checked luggage in case your luggage is lost or delayed.  If any of your medicines is considered a controlled substance, make sure you bring a letter from your health care provider with you. How should I store and discard my medicines? For safe storage:  Store medicines in a cool, dry area away from light, or as directed by your health care provider. Do not store medicines in the bathroom. Heat and humidity will affect them.  Do not store your medicines with other chemicals, or with medicines for pets or other household members.  Keep medicines away from children and pets. Do not leave them on counters or bedside tables. Store them in high cabinets or on high shelves. For safe disposal:  Check expiration dates regularly. Do not take expired medicines. Discard medicines that are older than the expiration date.  Learn a safe way to dispose of your medicines. You may: ? Use a local government, hospital, or pharmacy medicine-take-back program. ? Mix the medicines with inedible substances, put them in a sealed bag or empty container, and throw them in the trash. What should I remember?  Tell your health care provider if you: ? Experience side effects. ? Have new symptoms. ? Have other concerns about taking your medicines.  Review your medicines regularly with your health care provider. Other medicines, diet, medical conditions, weight changes, and daily habits can all affect how medicines work. Ask if you need to continue  taking each medicine, and discuss how well each one is working.  Refill your medicines early to avoid running out of them.  In case of an accidental overdose, call your local [place] at [phone] or visit your local emergency department immediately. This is important. Summary  Taking your medicines correctly is an important part of managing or preventing medical problems.  You need to make sure that you understand what you are taking a medicine for, as well as how and when you need to take it.  Know your medicines and use a pill organizer to help you take your medicines correctly.  In case of an accidental overdose, call your local [place] at [phone] or visit your local emergency department immediately. This is important. This information is not intended to replace advice given to you by your health care provider. Make sure you discuss any questions  you have with your health care provider. Document Revised: 03/25/2017 Document Reviewed: 03/25/2017 Elsevier Patient Education  2021 [employer].

## 2020-08-12 NOTE — Telephone Encounter (Signed)
I think Christian [name] would benefit from an appointment to check the accuracy of his home BP monitor, as well as, review BP monitoring technique and low salt diet for blood pressure. Is this something you are able to help with?  Debbora Dus, PharmD Clinical Pharmacist [facility] at [facility] [phone]

## 2020-08-13 NOTE — Telephone Encounter (Signed)
Spoke with patient to get nurse visit scheduled for these issues. Patient stated that he is not in front of his calendar, but he would call back to get this scheduled.

## 2020-08-15 NOTE — Telephone Encounter (Addendum)
Patient called back to schedule his nurse visit. Let him know I am unable to schedule but we will call him back. He is available all day for a return call.  Debbora Dus, PharmD Clinical Pharmacist [facility] at [facility] [phone]

## 2020-08-15 NOTE — Telephone Encounter (Signed)
Patient scheduled for NV on 5/12 at 3:00.

## 2020-08-18 ENCOUNTER — Other Ambulatory Visit: Payer: Self-pay

## 2020-08-18 ENCOUNTER — Encounter (HOSPITAL_COMMUNITY): Payer: Self-pay | Admitting: Emergency Medicine

## 2020-08-18 ENCOUNTER — Emergency Department (HOSPITAL_COMMUNITY): Payer: Medicare Other

## 2020-08-18 ENCOUNTER — Inpatient Hospital Stay (HOSPITAL_COMMUNITY)
Admission: EM | Admit: 2020-08-18 | Discharge: 2020-08-22 | DRG: 617 | Disposition: A | Discharge status: Home Health | Payer: Medicare Other | Attending: Family Medicine | Admitting: Family Medicine

## 2020-08-18 DIAGNOSIS — M2011 Hallux valgus (acquired), right foot: Secondary | ICD-10-CM | POA: Diagnosis not present

## 2020-08-18 DIAGNOSIS — E11628 Type 2 diabetes mellitus with other skin complications: Secondary | ICD-10-CM

## 2020-08-18 DIAGNOSIS — L0390 Cellulitis, unspecified: Secondary | ICD-10-CM | POA: Diagnosis not present

## 2020-08-18 DIAGNOSIS — E1129 Type 2 diabetes mellitus with other diabetic kidney complication: Secondary | ICD-10-CM

## 2020-08-18 DIAGNOSIS — L02611 Cutaneous abscess of right foot: Secondary | ICD-10-CM | POA: Diagnosis present

## 2020-08-18 DIAGNOSIS — Z79899 Other long term (current) drug therapy: Secondary | ICD-10-CM | POA: Diagnosis not present

## 2020-08-18 DIAGNOSIS — Z807 Family history of other malignant neoplasms of lymphoid, hematopoietic and related tissues: Secondary | ICD-10-CM

## 2020-08-18 DIAGNOSIS — M869 Osteomyelitis, unspecified: Secondary | ICD-10-CM | POA: Diagnosis not present

## 2020-08-18 DIAGNOSIS — L089 Local infection of the skin and subcutaneous tissue, unspecified: Secondary | ICD-10-CM | POA: Diagnosis not present

## 2020-08-18 DIAGNOSIS — E1165 Type 2 diabetes mellitus with hyperglycemia: Secondary | ICD-10-CM | POA: Diagnosis not present

## 2020-08-18 DIAGNOSIS — M868X7 Other osteomyelitis, ankle and foot: Secondary | ICD-10-CM | POA: Diagnosis not present

## 2020-08-18 DIAGNOSIS — M86171 Other acute osteomyelitis, right ankle and foot: Secondary | ICD-10-CM | POA: Diagnosis not present

## 2020-08-18 DIAGNOSIS — K219 Gastro-esophageal reflux disease without esophagitis: Secondary | ICD-10-CM | POA: Diagnosis not present

## 2020-08-18 DIAGNOSIS — L03119 Cellulitis of unspecified part of limb: Secondary | ICD-10-CM | POA: Diagnosis not present

## 2020-08-18 DIAGNOSIS — L03115 Cellulitis of right lower limb: Secondary | ICD-10-CM | POA: Diagnosis not present

## 2020-08-18 DIAGNOSIS — M609 Myositis, unspecified: Secondary | ICD-10-CM | POA: Diagnosis not present

## 2020-08-18 DIAGNOSIS — E1169 Type 2 diabetes mellitus with other specified complication: Principal | ICD-10-CM | POA: Diagnosis present

## 2020-08-18 DIAGNOSIS — L97516 Non-pressure chronic ulcer of other part of right foot with bone involvement without evidence of necrosis: Secondary | ICD-10-CM | POA: Diagnosis not present

## 2020-08-18 DIAGNOSIS — E785 Hyperlipidemia, unspecified: Secondary | ICD-10-CM | POA: Diagnosis not present

## 2020-08-18 DIAGNOSIS — E1140 Type 2 diabetes mellitus with diabetic neuropathy, unspecified: Secondary | ICD-10-CM | POA: Diagnosis present

## 2020-08-18 DIAGNOSIS — Z7984 Long term (current) use of oral hypoglycemic drugs: Secondary | ICD-10-CM | POA: Diagnosis not present

## 2020-08-18 DIAGNOSIS — R809 Proteinuria, unspecified: Secondary | ICD-10-CM

## 2020-08-18 DIAGNOSIS — E872 Acidosis: Secondary | ICD-10-CM | POA: Diagnosis not present

## 2020-08-18 DIAGNOSIS — Z823 Family history of stroke: Secondary | ICD-10-CM

## 2020-08-18 DIAGNOSIS — R531 Weakness: Secondary | ICD-10-CM | POA: Diagnosis not present

## 2020-08-18 DIAGNOSIS — Z794 Long term (current) use of insulin: Secondary | ICD-10-CM | POA: Diagnosis not present

## 2020-08-18 DIAGNOSIS — I10 Essential (primary) hypertension: Secondary | ICD-10-CM | POA: Diagnosis present

## 2020-08-18 DIAGNOSIS — M7989 Other specified soft tissue disorders: Secondary | ICD-10-CM | POA: Diagnosis not present

## 2020-08-18 DIAGNOSIS — L97519 Non-pressure chronic ulcer of other part of right foot with unspecified severity: Secondary | ICD-10-CM | POA: Diagnosis not present

## 2020-08-18 DIAGNOSIS — M19071 Primary osteoarthritis, right ankle and foot: Secondary | ICD-10-CM | POA: Diagnosis not present

## 2020-08-18 DIAGNOSIS — Z20822 Contact with and (suspected) exposure to covid-19: Secondary | ICD-10-CM | POA: Diagnosis present

## 2020-08-18 DIAGNOSIS — E11621 Type 2 diabetes mellitus with foot ulcer: Secondary | ICD-10-CM | POA: Diagnosis not present

## 2020-08-18 LAB — CBC WITH DIFFERENTIAL/PLATELET
Abs Immature Granulocytes: 0.04 10*3/uL (ref 0.00–0.07)
Basophils Absolute: 0 10*3/uL (ref 0.0–0.1)
Basophils Relative: 0 %
Eosinophils Absolute: 0.1 10*3/uL (ref 0.0–0.5)
Eosinophils Relative: 1 %
HCT: 39.4 % (ref 39.0–52.0)
Hemoglobin: 12.6 g/dL — ABNORMAL LOW (ref 13.0–17.0)
Immature Granulocytes: 0 %
Lymphocytes Relative: 19 %
Lymphs Abs: 1.9 10*3/uL (ref 0.7–4.0)
MCH: 29.5 pg (ref 26.0–34.0)
MCHC: 32 g/dL (ref 30.0–36.0)
MCV: 92.3 fL (ref 80.0–100.0)
Monocytes Absolute: 0.8 10*3/uL (ref 0.1–1.0)
Monocytes Relative: 8 %
Neutro Abs: 6.8 10*3/uL (ref 1.7–7.7)
Neutrophils Relative %: 72 %
Platelets: 211 10*3/uL (ref 150–400)
RBC: 4.27 MIL/uL (ref 4.22–5.81)
RDW: 14.1 % (ref 11.5–15.5)
WBC: 9.7 10*3/uL (ref 4.0–10.5)
nRBC: 0 % (ref 0.0–0.2)

## 2020-08-18 LAB — RESP PANEL BY RT-PCR (FLU A&B, COVID) ARPGX2
Influenza A by PCR: NEGATIVE
Influenza B by PCR: NEGATIVE
SARS Coronavirus 2 by RT PCR: NEGATIVE

## 2020-08-18 LAB — COMPREHENSIVE METABOLIC PANEL
ALT: 18 U/L (ref 0–44)
AST: 23 U/L (ref 15–41)
Albumin: 3.3 g/dL — ABNORMAL LOW (ref 3.5–5.0)
Alkaline Phosphatase: 55 U/L (ref 38–126)
Anion gap: 11 (ref 5–15)
BUN: 14 mg/dL (ref 8–23)
CO2: 20 mmol/L — ABNORMAL LOW (ref 22–32)
Calcium: 8.9 mg/dL (ref 8.9–10.3)
Chloride: 108 mmol/L (ref 98–111)
Creatinine, Ser: 1.04 mg/dL (ref 0.61–1.24)
GFR, Estimated: >60 mL/min (ref >60)
Glucose, Bld: 140 mg/dL — ABNORMAL HIGH (ref 70–99)
Potassium: 4.3 mmol/L (ref 3.5–5.1)
Sodium: 139 mmol/L (ref 135–145)
Total Bilirubin: 0.9 mg/dL (ref 0.3–1.2)
Total Protein: 6.7 g/dL (ref 6.5–8.1)

## 2020-08-18 LAB — LACTIC ACID, PLASMA
Lactic Acid, Venous: 4 mmol/L (ref 0.5–1.9)
Lactic Acid, Venous: 3.2 mmol/L (ref 0.5–1.9)

## 2020-08-18 LAB — GLUCOSE, CAPILLARY: Glucose-Capillary: 211 mg/dL — ABNORMAL HIGH (ref 70–99)

## 2020-08-18 MED ORDER — ATORVASTATIN CALCIUM 80 MG PO TABS
80.0000 mg | ORAL_TABLET | Freq: Every day | ORAL | Status: DC
  Administered 2020-08-18 – 2020-08-21 (×4): 80 mg via ORAL
  Filled 2020-08-18 (×4): qty 1

## 2020-08-18 MED ORDER — INSULIN DETEMIR 100 UNIT/ML ~~LOC~~ SOLN
20.0000 [IU] | Freq: Every day | SUBCUTANEOUS | Status: DC
  Administered 2020-08-18 – 2020-08-21 (×4): 20 [IU] via SUBCUTANEOUS
  Filled 2020-08-18 (×6): qty 0.2

## 2020-08-18 MED ORDER — OXYCODONE HCL 5 MG PO TABS
5.0000 mg | ORAL_TABLET | ORAL | Status: DC | PRN
Start: 2020-08-18 — End: 2020-08-22
  Administered 2020-08-21 – 2020-08-22 (×3): 5 mg via ORAL
  Filled 2020-08-18 (×3): qty 1

## 2020-08-18 MED ORDER — GABAPENTIN 100 MG PO CAPS
200.0000 mg | ORAL_CAPSULE | Freq: Two times a day (BID) | ORAL | Status: DC
  Administered 2020-08-18 – 2020-08-22 (×7): 200 mg via ORAL
  Filled 2020-08-18 (×7): qty 2

## 2020-08-18 MED ORDER — INSULIN ASPART 100 UNIT/ML IJ SOLN
0.0000 [IU] | Freq: Three times a day (TID) | INTRAMUSCULAR | Status: DC
  Administered 2020-08-19: 3 [IU] via SUBCUTANEOUS
  Administered 2020-08-19: 7 [IU] via SUBCUTANEOUS
  Administered 2020-08-19: 4 [IU] via SUBCUTANEOUS
  Administered 2020-08-20: 7 [IU] via SUBCUTANEOUS
  Administered 2020-08-20: 4 [IU] via SUBCUTANEOUS
  Administered 2020-08-20 – 2020-08-21 (×3): 7 [IU] via SUBCUTANEOUS
  Administered 2020-08-22 (×2): 11 [IU] via SUBCUTANEOUS
  Administered 2020-08-22: 15 [IU] via SUBCUTANEOUS

## 2020-08-18 MED ORDER — ACETAMINOPHEN 325 MG PO TABS
650.0000 mg | ORAL_TABLET | Freq: Four times a day (QID) | ORAL | Status: DC | PRN
  Administered 2020-08-20: 650 mg via ORAL
  Filled 2020-08-18: qty 2

## 2020-08-18 MED ORDER — ACETAMINOPHEN 650 MG RE SUPP: 650.0000 mg | Freq: Four times a day (QID) | RECTAL | Status: DC | PRN

## 2020-08-18 MED ORDER — HYDRALAZINE HCL 25 MG PO TABS: 25.0000 mg | ORAL_TABLET | Freq: Four times a day (QID) | ORAL | Status: DC | PRN

## 2020-08-18 MED ORDER — VANCOMYCIN HCL 2000 MG/400ML IV SOLN
2000.0000 mg | Freq: Once | INTRAVENOUS | Status: AC
  Administered 2020-08-19: 2000 mg via INTRAVENOUS
  Filled 2020-08-18 (×2): qty 400

## 2020-08-18 MED ORDER — ONDANSETRON HCL 4 MG PO TABS: 4.0000 mg | ORAL_TABLET | Freq: Four times a day (QID) | ORAL | Status: DC | PRN

## 2020-08-18 MED ORDER — POLYETHYLENE GLYCOL 3350 17 G PO PACK: 17.0000 g | PACK | Freq: Every day | ORAL | Status: DC | PRN

## 2020-08-18 MED ORDER — PIPERACILLIN-TAZOBACTAM 3.375 G IVPB 30 MIN
3.3750 g | Freq: Once | INTRAVENOUS | Status: AC
  Administered 2020-08-18: 3.375 g via INTRAVENOUS
  Filled 2020-08-18: qty 50

## 2020-08-18 MED ORDER — PANTOPRAZOLE SODIUM 40 MG PO TBEC
40.0000 mg | DELAYED_RELEASE_TABLET | Freq: Two times a day (BID) | ORAL | Status: DC
  Administered 2020-08-18 – 2020-08-22 (×7): 40 mg via ORAL
  Filled 2020-08-18 (×7): qty 1

## 2020-08-18 MED ORDER — FLORANEX PO PACK
1.0000 g | PACK | Freq: Three times a day (TID) | ORAL | Status: DC
  Administered 2020-08-19 – 2020-08-22 (×8): 1 g via ORAL
  Filled 2020-08-18 (×14): qty 1

## 2020-08-18 MED ORDER — VANCOMYCIN HCL 1000 MG/200ML IV SOLN
1000.0000 mg | Freq: Two times a day (BID) | INTRAVENOUS | Status: DC
  Administered 2020-08-19 – 2020-08-22 (×7): 1000 mg via INTRAVENOUS
  Filled 2020-08-18 (×8): qty 200

## 2020-08-18 MED ORDER — SODIUM CHLORIDE 0.9 % IV SOLN: INTRAVENOUS | Status: AC

## 2020-08-18 MED ORDER — PIPERACILLIN-TAZOBACTAM 3.375 G IVPB
3.3750 g | Freq: Three times a day (TID) | INTRAVENOUS | Status: DC
  Administered 2020-08-19 – 2020-08-20 (×5): 3.375 g via INTRAVENOUS
  Filled 2020-08-18 (×6): qty 50

## 2020-08-18 MED ORDER — ENOXAPARIN SODIUM 40 MG/0.4ML IJ SOSY
40.0000 mg | PREFILLED_SYRINGE | INTRAMUSCULAR | Status: DC
  Administered 2020-08-18 – 2020-08-20 (×3): 40 mg via SUBCUTANEOUS
  Filled 2020-08-18 (×3): qty 0.4

## 2020-08-18 MED ORDER — ONDANSETRON HCL 4 MG/2ML IJ SOLN: 4.0000 mg | Freq: Four times a day (QID) | INTRAMUSCULAR | Status: DC | PRN

## 2020-08-18 NOTE — Progress Notes (Addendum)
Pharmacy Antibiotic Note  Christian [name] is a 80 y.o. male admitted on 08/18/2020 with cellulitis, LA 4.  Pharmacy has been consulted for vancomycin dosing.  Zosyn per MD  Plan: Vancomycin 2000 mg IV x 1, then 1000 mg IV q12h (eAUC 470, Goal AUC 400-550, SCr 1.04) Monitor renal function, clinical progression to narrow Vancomycin levels as needed  Add Zosyn 3.375g IV every 8 hours (Extended infusion) for wound infection      Temp (24hrs), Avg:98.2 F (36.8 C), Min:97.9 F (36.6 C), Max:98.4 F (36.9 C)  Recent Labs  Lab 08/18/20 1324  WBC 9.7  CREATININE 1.04  LATICACIDVEN 4.0*    Estimated Creatinine Clearance: 67 mL/min (by C-G formula based on SCr of 1.04 mg/dL).    No Known Allergies  [name], PharmD Clinical Pharmacist ED Pharmacist Phone # [phone] 08/18/2020 4:28 PM

## 2020-08-18 NOTE — ED Triage Notes (Addendum)
C/o wound to R lateral foot x 2 months that has been worse with pain, redness, drainage, and swelling x 1 week.  Denies fever and chills.  States he saw his podiatrist 1 month ago and used cream without improvement.

## 2020-08-18 NOTE — ED Provider Notes (Signed)
MSE was initiated and I personally evaluated the patient and placed orders (if any) at  2:02 PM on Aug 18, 2020.  The patient appears stable so that the remainder of the MSE may be completed by another provider.   Chief Complaint: wound   HPI:  Pt presents with wound to R lateral foot for the last two months, worsening over the last week. No fevers. States that it has become red with some drainage over the last week as well. States that he was on one week of abx in April, has not seen anyone since then according to pt.    ROS: wound   Physical Exam:  Gen:                Awake, no distress  HEENT:          Atraumatic  Resp:              Normal effort  Cardiac:          Normal rate  Abd:                Nondistended, nontender  MSK:              wound to R plantar aspect of foot. Draining. DP 2+ Neuro:            Speech clear,EOMS intact      Initiation of care has begun. The patient has been counseled on the process, plan, and necessity for staying for the completion/evaluation, and the remainder of the medical screening examination    Alfredia Client, PA-C 08/18/20 1412    Daleen Bo, MD 08/18/20 1902

## 2020-08-18 NOTE — ED Provider Notes (Signed)
[facility] EMERGENCY DEPARTMENT Provider Note   CSN: [id number] Arrival date & time: 08/18/20  1255     History No chief complaint on file.   Christian [name] is a 80 y.o. male.  HPI Presents for evaluation of a draining wound of his right foot, which was noticed by his wife this morning.  He is being managed for this wound, by his PCP with hydrocolloid dressings.  Also, about 3 weeks ago, he was treated with a course of doxycycline for apparent infection in the area.  He reports the problem started about 3 months ago and his podiatrist was managing it with a topical cream of some type.  He has not had prior surgery of his right foot.  He is a diabetic.  He denies fever, chills, cough, shortness of breath, nausea, vomiting, weakness or dizziness.  There are no other known modifying factors.    Past Medical History:  Diagnosis Date  . Arthritis   . Blood transfusion without reported diagnosis    ? in 2017 after crushing injury   . Cataract    right eye removed   . Crushing injury of pelvic region   . Diabetes mellitus without complication (HCC)   . Hyperlipidemia   . Hypertension     Patient Active Problem List   Diagnosis Date Noted  . Diabetic foot ulcer (HCC) 08/11/2020  . Healthcare maintenance 01/10/2020  . Lower back pain 09/27/2019  . Thumb pain 06/25/2019  . Medicare annual wellness visit, subsequent 03/12/2018  . Advance care planning 03/12/2018  . Shoulder pain 02/03/2017  . Edema 11/02/2016  . Diabetes mellitus with microalbuminuria (HCC) 06/10/2016  . HLD (hyperlipidemia) 06/10/2016  . DM type 2 with diabetic peripheral neuropathy (HCC)   . HTN (hypertension)   . Accident caused by farm tractor 01/15/2016  . Colon polyps 06/24/2013    Past Surgical History:  Procedure Laterality Date  . ACHILLES TENDON SURGERY Right   . APPLICATION OF A-CELL OF BACK N/A 02/10/2016   Procedure: APPLICATION OF A-CELL OF sacral wound;  Surgeon: Alena Bills  Dillingham, DO;  Location: [facility] OR;  Service: Plastics;  Laterality: N/A;  . APPLICATION OF WOUND VAC N/A 02/10/2016   Procedure: APPLICATION OF WOUND VAC possible;  Surgeon: Alena Bills Dillingham, DO;  Location: [facility] OR;  Service: Plastics;  Laterality: N/A;  . COLONOSCOPY    . I & D EXTREMITY N/A 01/30/2016   Procedure: IRRIGATION AND DEBRIDEMENT OF THE SACRUM;  Surgeon: Alena Bills Dillingham, DO;  Location: [facility] OR;  Service: Plastics;  Laterality: N/A;  . INCISION AND DRAINAGE OF WOUND N/A 02/10/2016   Procedure: IRRIGATION AND DEBRIDEMENT sacral WOUND;  Surgeon: Alena Bills Dillingham, DO;  Location: [facility] OR;  Service: Plastics;  Laterality: N/A;  . IR GENERIC HISTORICAL  02/11/2016   IR CATHETER TUBE CHANGE 02/11/2016 Irish Lack, MD [facility]-INTERV RAD  . IR GENERIC HISTORICAL  03/16/2016   IR CATHETER TUBE CHANGE 03/16/2016 Simonne Come, MD [facility]-INTER[facility]  . IR GENERIC HISTORICAL  05/11/2016   IR CATHETER TUBE CHANGE 05/11/2016 Irish Lack, MD [facility]  . POLYPECTOMY    . SACRO-ILIAC PINNING N/A 01/16/2016   Procedure: Terrilee Croak;  Surgeon: Myrene Galas, MD;  Location: [facility] OR;  Service: Orthopedics;  Laterality: N/A;  . SUPRAPUBIC CATHETER INSERTION     s/p removal 2018  . TONSILLECTOMY    . ULNAR NERVE TRANSPOSITION Right        Family History  Problem Relation Age of Onset  .  Lymphoma Mother   . CVA Father   . Dementia Father   . Diabetes Neg Hx   . Prostate cancer Neg Hx   . Colon polyps Neg Hx   . Colon cancer Neg Hx   . Rectal cancer Neg Hx   . Stomach cancer Neg Hx   . Esophageal cancer Neg Hx     Social History   Tobacco Use  . Smoking status: Never Smoker  . Smokeless tobacco: Never Used  Substance Use Topics  . Alcohol use: No  . Drug use: No    Home Medications Prior to Admission medications   Medication Sig Start Date End Date Taking? Authorizing Provider  amLODipine (NORVASC) 2.5 MG tablet Take 1 tablet (2.5 mg total) by mouth daily. 04/16/20    Tonia Ghent, MD  atorvastatin (LIPITOR) 80 MG tablet Take 1 tablet (80 mg total) by mouth at bedtime. 04/16/20   Tonia Ghent, MD  gabapentin (NEURONTIN) 300 MG capsule Take one capsule by mouth every morning and 2 capsules by mouth at bedtime. 03/08/18   Tonia Ghent, MD  glipiZIDE (GLUCOTROL) 5 MG tablet Take 2 tablets by mouth in the morning and 1 tablet by mouth at night. 04/16/20   Tonia Ghent, MD  glucose blood (ONETOUCH ULTRA) test strip USE 1 STRIP TO CHECK GLUCOSE ONCE DAILY 07/05/20   Tonia Ghent, MD  insulin detemir (LEVEMIR FLEXTOUCH) 100 UNIT/ML FlexPen INJECT UP TO 20-36 UNITS SUBCUTANEOUSLY ONCE DAILY 08/08/20   Tonia Ghent, MD  Insulin Pen Needle (RELION PEN NEEDLE 31G/8MM) 31G X 8 MM MISC USE TO INJECT LEVEMIR DAILY 11/07/19   Tonia Ghent, MD  lisinopril (ZESTRIL) 30 MG tablet Take 1 tablet (30 mg total) by mouth daily. 07/19/20   Tonia Ghent, MD  metFORMIN (GLUCOPHAGE) 1000 MG tablet Take 1 tablet (1,000 mg total) by mouth 2 (two) times daily with a meal. 08/08/20   Tonia Ghent, MD  pantoprazole (PROTONIX) 40 MG tablet Take 1 tablet by mouth twice daily 02/19/20   Tonia Ghent, MD  silver sulfADIAZINE (SILVADENE) 1 % cream Apply pea-sized amount to wound daily. 07/23/20   Evelina Bucy, DPM    Allergies    Patient has no known allergies.  Review of Systems   Review of Systems  All other systems reviewed and are negative.   Physical Exam Updated Vital Signs BP (!) 146/72   Pulse 71   Temp 97.9 F (36.6 C) (Oral)   Resp 16   SpO2 100%   Physical Exam Vitals and nursing note reviewed.  Constitutional:      General: He is not in acute distress.    Appearance: He is well-developed. He is not ill-appearing, toxic-appearing or diaphoretic.  HENT:     Head: Normocephalic and atraumatic.     Right Ear: External ear normal.     Left Ear: External ear normal.  Eyes:     Conjunctiva/sclera: Conjunctivae normal.     Pupils: Pupils  are equal, round, and reactive to light.  Neck:     Trachea: Phonation normal.  Cardiovascular:     Rate and Rhythm: Normal rate.  Pulmonary:     Effort: Pulmonary effort is normal.  Abdominal:     General: There is no distension.     Tenderness: There is no abdominal tenderness.  Musculoskeletal:        General: Normal range of motion.     Cervical back: Normal range of motion  and neck supple.  Skin:    General: Skin is warm and dry.  Neurological:     Mental Status: He is alert and oriented to person, place, and time.     Cranial Nerves: No cranial nerve deficit.     Sensory: No sensory deficit.     Motor: No abnormal muscle tone.     Coordination: Coordination normal.  Psychiatric:        Mood and Affect: Mood normal.        Behavior: Behavior normal.        Thought Content: Thought content normal.        Judgment: Judgment normal.         ED Results / Procedures / Treatments   Labs (all labs ordered are listed, but only abnormal results are displayed) Labs Reviewed  LACTIC ACID, PLASMA - Abnormal; Notable for the following components:      Result Value   Lactic Acid, Venous 4.0 (*)    All other components within normal limits  COMPREHENSIVE METABOLIC PANEL - Abnormal; Notable for the following components:   CO2 20 (*)    Glucose, Bld 140 (*)    Albumin 3.3 (*)    All other components within normal limits  CBC WITH DIFFERENTIAL/PLATELET - Abnormal; Notable for the following components:   Hemoglobin 12.6 (*)    All other components within normal limits  RESP PANEL BY RT-PCR (FLU A&B, COVID) ARPGX2  LACTIC ACID, PLASMA    EKG None  Radiology DG Foot Complete Right  Result Date: 08/18/2020 CLINICAL DATA:  Infection in the right foot. EXAM: RIGHT FOOT COMPLETE - 3+ VIEW COMPARISON:  Foot radiographs dated 07/27/2020. FINDINGS: There is no evidence of fracture or dislocation. There is soft tissue swelling around the fifth metatarsal phalangeal joint. There is  no underlying focal osseous demineralization to suggest osteomyelitis. Degenerative changes are seen in the first digit metatarsal phalangeal joint including hallux valgus deformity. There is diffuse osseous demineralization. Plantar and posterior calcaneal enthesophytes are noted. IMPRESSION: No focal osseous demineralization to suggest osteomyelitis. Electronically Signed   By: Zerita Boers M.D.   On: 08/18/2020 16:00    Procedures .Critical Care Performed by: Daleen Bo, MD Authorized by: Daleen Bo, MD   Critical care provider statement:    Critical care time (minutes):  35   Critical care start time:  08/18/2020 3:34 PM   Critical care end time:  08/18/2020 5:17 PM   Critical care time was exclusive of:  Separately billable procedures and treating other patients   Critical care was necessary to treat or prevent imminent or life-threatening deterioration of the following conditions:  Metabolic crisis   Critical care was time spent personally by me on the following activities:  Blood draw for specimens, development of treatment plan with patient or surrogate, discussions with consultants, evaluation of patient's response to treatment, examination of patient, obtaining history from patient or surrogate, ordering and performing treatments and interventions, ordering and review of laboratory studies, pulse oximetry, re-evaluation of patient's condition, review of old charts and ordering and review of radiographic studies     Medications Ordered in ED Medications  piperacillin-tazobactam (ZOSYN) IVPB 3.375 g (has no administration in time range)  vancomycin (VANCOREADY) IVPB 2000 mg/400 mL (has no administration in time range)  vancomycin (VANCOREADY) IVPB 1000 mg/200 mL (has no administration in time range)    ED Course  I have reviewed the triage vital signs and the nursing notes.  Pertinent labs & imaging results  that were available during my care of the patient were reviewed by me  and considered in my medical decision making (see chart for details).    MDM Rules/Calculators/A&P                           Patient Vitals for the past 24 hrs:  BP Temp Temp src Pulse Resp SpO2  08/18/20 1630 (!) 146/72 -- -- 71 16 100 %  08/18/20 1600 (!) 146/93 -- -- 72 19 100 %  08/18/20 1530 (!) 109/93 97.9 F (36.6 C) Oral 77 20 100 %  08/18/20 1314 119/69 98.4 F (36.9 C) Oral 97 20 98 %    5:14 PM Reevaluation with update and discussion. After initial assessment and treatment, an updated evaluation reveals he remains comfortable has no further complaints.  Findings discussed and questions answered. Daleen Bo   Medical Decision Making:  This patient is presenting for evaluation of right foot infection, which does require a range of treatment options, and is a complaint that involves a moderate risk of morbidity and mortality. The differential diagnoses include superficial infection, deep tissue infection, osteomyelitis, diabetes complication. I decided to review old records, and in summary elderly male being treated for chronic wound for 3 months, presenting now with draining wound..  I received additional historical information from son, at the bedside.  Clinical Laboratory Tests Ordered, included CBC and Metabolic panel. Review indicates normal except CO2 low, glucose high, lactate high, hemoglobin low. Radiologic Tests Ordered, included right foot x-ray.  I independently Visualized: Radiograph images, which show no osteomyelitis or fracture   Critical Interventions-medical evaluation, laboratory testing, radiography, observation and reassess  The patient is noted to have a lactate>4. With the current information available to me, I don't think the patient is in septic shock. The lactate>4, is related to OTHER SHOCK Chronic foot wound.  After These Interventions, the Patient was reevaluated and was found with localized infection right foot, associated with drainage  likely from abscess, and mild cellulitis.  Patient has failed outpatient treatment with topical and oral treatments.  A dose of parenteral antibiotics given. Ortho Consultation; Dr. Sharol Given will evaluate the patient tomorrow.  Patient will be admitted for stabilization and treatment, with diabetes consultation.    CRITICAL CARE-yes Performed by: Daleen Bo  Nursing Notes Reviewed/ Care Coordinated Applicable Imaging Reviewed Interpretation of Laboratory Data incorporated into ED treatment   5:17 PM-Consult complete with hospital. Patient case explained and discussed.  He agrees to admit patient for further evaluation and treatment. Call ended at 5:28 PM   Final Clinical Impression(s) / ED Diagnoses Final diagnoses:  Diabetic foot infection [facility])    Rx / [place] Orders ED Discharge Orders    None       Daleen Bo, MD 08/18/20 [phone]

## 2020-08-18 NOTE — ED Notes (Signed)
Dr. Eulis Foster and Posey Pronto, PA notified of Lactic Acid 4.  Pt being moved to treatment room.

## 2020-08-18 NOTE — ED Notes (Signed)
IV team called to advise that they will be delayed a while in coming to start IV.  Working with ED staff to seek assistance.

## 2020-08-18 NOTE — H&P (Signed)
History and Physical    Christian [name] ZOX:[id number] DOB: 09-03-1940 DOA: 08/18/2020  PCP: Tonia Ghent, MD (Confirm with patient/family/NH records and if not entered, this has to be entered at [facility] point of entry) Patient coming from: Home  I have personally briefly reviewed patient's old medical records in [place]  Chief Complaint: Right foot pain and swelling  HPI: Christian [name] is a 80 y.o. male with medical history significant of HTN, IDDM, diabetic neuropathy, HLD, presented with worsening of right foot infection.  Patient first noticed right foot shallow ulcer about 2 to 3 months ago, slowly the ulcer became larger and lateral side of the right foot became swelling.  1 month ago, he went to urgent care, who referred him to see podiatry and started him on 1 week off twice daily antibiotics.  After finishing the antibiotics and with some warm water bath of the right foot, the infection appeared to be improved and patient did not go to podiatrist.  1 week ago, it appears that wound started to grow again with pus coming out.  Denies any fever chills. ED Course: Small on right foot abscess and cellulitis, x-ray showed no bone. involvement.  WBC 9.7, lactic acid 4.0  Review of Systems: As per HPI otherwise 14 point review of systems negative.    Past Medical History:  Diagnosis Date  . Arthritis   . Blood transfusion without reported diagnosis    ? in 2017 after crushing injury   . Cataract    right eye removed   . Crushing injury of pelvic region   . Diabetes mellitus without complication ([place])   . Hyperlipidemia   . Hypertension     Past Surgical History:  Procedure Laterality Date  . ACHILLES TENDON SURGERY Right   . APPLICATION OF A-CELL OF BACK N/A 02/10/2016   Procedure: APPLICATION OF A-CELL OF sacral wound;  Surgeon: Loel Lofty Dillingham, DO;  Location: [place];  Service: Plastics;  Laterality: N/A;  . APPLICATION OF WOUND VAC N/A 02/10/2016   Procedure:  APPLICATION OF WOUND VAC possible;  Surgeon: Loel Lofty Dillingham, DO;  Location: [place];  Service: Plastics;  Laterality: N/A;  . COLONOSCOPY    . I & D EXTREMITY N/A 01/30/2016   Procedure: IRRIGATION AND DEBRIDEMENT OF THE SACRUM;  Surgeon: Loel Lofty Dillingham, DO;  Location: [place];  Service: Plastics;  Laterality: N/A;  . INCISION AND DRAINAGE OF WOUND N/A 02/10/2016   Procedure: IRRIGATION AND DEBRIDEMENT sacral WOUND;  Surgeon: Loel Lofty Dillingham, DO;  Location: [place];  Service: Plastics;  Laterality: N/A;  . IR GENERIC HISTORICAL  02/11/2016   IR CATHETER TUBE CHANGE 02/11/2016 Aletta Edouard, MD [facility]INTER[facility]  . IR GENERIC HISTORICAL  03/16/2016   IR CATHETER TUBE CHANGE 03/16/2016 Sandi Mariscal, MD [facility]  . IR GENERIC HISTORICAL  05/11/2016   IR CATHETER TUBE CHANGE 05/11/2016 Aletta Edouard, MD [facility]  . POLYPECTOMY    . SACRO-ILIAC PINNING N/A 01/16/2016   Procedure: Jenetta Downer;  Surgeon: Altamese [place], MD;  Location: [place];  Service: Orthopedics;  Laterality: N/A;  . SUPRAPUBIC CATHETER INSERTION     s/p removal 2018  . TONSILLECTOMY    . ULNAR NERVE TRANSPOSITION Right      reports that he has never smoked. He has never used smokeless tobacco. He reports that he does not drink alcohol and does not use drugs.  No Known Allergies  Family History  Problem Relation Age of Onset  .  Lymphoma Mother   . CVA Father   . Dementia Father   . Diabetes Neg Hx   . Prostate cancer Neg Hx   . Colon polyps Neg Hx   . Colon cancer Neg Hx   . Rectal cancer Neg Hx   . Stomach cancer Neg Hx   . Esophageal cancer Neg Hx      Prior to Admission medications   Medication Sig Start Date End Date Taking? Authorizing Provider  amLODipine (NORVASC) 2.5 MG tablet Take 1 tablet (2.5 mg total) by mouth daily. 04/16/20   Tonia Ghent, MD  atorvastatin (LIPITOR) 80 MG tablet Take 1 tablet (80 mg total) by mouth at bedtime. 04/16/20   Tonia Ghent, MD  gabapentin  (NEURONTIN) 300 MG capsule Take one capsule by mouth every morning and 2 capsules by mouth at bedtime. 03/08/18   Tonia Ghent, MD  glipiZIDE (GLUCOTROL) 5 MG tablet Take 2 tablets by mouth in the morning and 1 tablet by mouth at night. 04/16/20   Tonia Ghent, MD  glucose blood (ONETOUCH ULTRA) test strip USE 1 STRIP TO CHECK GLUCOSE ONCE DAILY 07/05/20   Tonia Ghent, MD  insulin detemir (LEVEMIR FLEXTOUCH) 100 UNIT/ML FlexPen INJECT UP TO 20-36 UNITS SUBCUTANEOUSLY ONCE DAILY 08/08/20   Tonia Ghent, MD  Insulin Pen Needle (RELION PEN NEEDLE 31G/8MM) 31G X 8 MM MISC USE TO INJECT LEVEMIR DAILY 11/07/19   Tonia Ghent, MD  lisinopril (ZESTRIL) 30 MG tablet Take 1 tablet (30 mg total) by mouth daily. 07/19/20   Tonia Ghent, MD  metFORMIN (GLUCOPHAGE) 1000 MG tablet Take 1 tablet (1,000 mg total) by mouth 2 (two) times daily with a meal. 08/08/20   Tonia Ghent, MD  pantoprazole (PROTONIX) 40 MG tablet Take 1 tablet by mouth twice daily 02/19/20   Tonia Ghent, MD  silver sulfADIAZINE (SILVADENE) 1 % cream Apply pea-sized amount to wound daily. 07/23/20   Evelina Bucy, DPM    Physical Exam: Vitals:   08/18/20 1314 08/18/20 1530 08/18/20 1600 08/18/20 1630  BP: 119/69 (!) 109/93 (!) 146/93 (!) 146/72  Pulse: 97 77 72 71  Resp: 20 20 19 16   Temp: 98.4 F (36.9 C) 97.9 F (36.6 C)    TempSrc: Oral Oral    SpO2: 98% 100% 100% 100%    Constitutional: NAD, calm, comfortable Vitals:   08/18/20 1314 08/18/20 1530 08/18/20 1600 08/18/20 1630  BP: 119/69 (!) 109/93 (!) 146/93 (!) 146/72  Pulse: 97 77 72 71  Resp: 20 20 19 16   Temp: 98.4 F (36.9 C) 97.9 F (36.6 C)    TempSrc: Oral Oral    SpO2: 98% 100% 100% 100%   Eyes: PERRL, lids and conjunctivae normal ENMT: Mucous membranes are moist. Posterior pharynx clear of any exudate or lesions.Normal dentition.  Neck: normal, supple, no masses, no thyromegaly Respiratory: clear to auscultation bilaterally, no  wheezing, no crackles. Normal respiratory effort. No accessory muscle use.  Cardiovascular: Regular rate and rhythm, no murmurs / rubs / gallops. No extremity edema. 2+ pedal pulses. No carotid bruits.  Abdomen: no tenderness, no masses palpated. No hepatosplenomegaly. Bowel sounds positive.  Musculoskeletal: no clubbing / cyanosis. No joint deformity upper and lower extremities. Good ROM, no contractures. Normal muscle tone.  Skin: Right foot lateral ulcer with thick discharge and irregular border Neurologic: CN 2-12 grossly intact. Sensation intact, DTR normal. Strength 5/5 in all 4.  Psychiatric: Normal judgment and insight. Alert and oriented  x 3. Normal mood.         Labs on Admission: I have personally reviewed following labs and imaging studies  CBC: Recent Labs  Lab 08/18/20 1324  WBC 9.7  NEUTROABS 6.8  HGB 12.6*  HCT 39.4  MCV 92.3  PLT 123456   Basic Metabolic Panel: Recent Labs  Lab 08/18/20 1324  NA 139  K 4.3  CL 108  CO2 20*  GLUCOSE 140*  BUN 14  CREATININE 1.04  CALCIUM 8.9   GFR: Estimated Creatinine Clearance: 67 mL/min (by C-G formula based on SCr of 1.04 mg/dL). Liver Function Tests: Recent Labs  Lab 08/18/20 1324  AST 23  ALT 18  ALKPHOS 55  BILITOT 0.9  PROT 6.7  ALBUMIN 3.3*   No results for input(s): LIPASE, AMYLASE in the last 168 hours. No results for input(s): AMMONIA in the last 168 hours. Coagulation Profile: No results for input(s): INR, PROTIME in the last 168 hours. Cardiac Enzymes: No results for input(s): CKTOTAL, CKMB, CKMBINDEX, TROPONINI in the last 168 hours. BNP (last 3 results) No results for input(s): PROBNP in the last 8760 hours. HbA1C: No results for input(s): HGBA1C in the last 72 hours. CBG: No results for input(s): GLUCAP in the last 168 hours. Lipid Profile: No results for input(s): CHOL, HDL, LDLCALC, TRIG, CHOLHDL, LDLDIRECT in the last 72 hours. Thyroid Function Tests: No results for input(s): TSH,  T4TOTAL, FREET4, T3FREE, THYROIDAB in the last 72 hours. Anemia Panel: No results for input(s): VITAMINB12, FOLATE, FERRITIN, TIBC, IRON, RETICCTPCT in the last 72 hours. Urine analysis:    Component Value Date/Time   COLORURINE YELLOW 05/17/2016 0650   APPEARANCEUR CLOUDY (A) 05/17/2016 0650   LABSPEC 1.014 05/17/2016 0650   PHURINE 5.0 05/17/2016 0650   GLUCOSEU NEGATIVE 05/17/2016 0650   HGBUR NEGATIVE 05/17/2016 0650   BILIRUBINUR NEGATIVE 05/17/2016 0650   KETONESUR NEGATIVE 05/17/2016 0650   PROTEINUR NEGATIVE 05/17/2016 0650   NITRITE NEGATIVE 05/17/2016 0650   LEUKOCYTESUR LARGE (A) 05/17/2016 0650    Radiological Exams on Admission: DG Foot Complete Right  Result Date: 08/18/2020 CLINICAL DATA:  Infection in the right foot. EXAM: RIGHT FOOT COMPLETE - 3+ VIEW COMPARISON:  Foot radiographs dated 07/27/2020. FINDINGS: There is no evidence of fracture or dislocation. There is soft tissue swelling around the fifth metatarsal phalangeal joint. There is no underlying focal osseous demineralization to suggest osteomyelitis. Degenerative changes are seen in the first digit metatarsal phalangeal joint including hallux valgus deformity. There is diffuse osseous demineralization. Plantar and posterior calcaneal enthesophytes are noted. IMPRESSION: No focal osseous demineralization to suggest osteomyelitis. Electronically Signed   By: Zerita Boers M.D.   On: 08/18/2020 16:00    EKG: Ordered  Assessment/Plan Active Problems:   Cellulitis in diabetic foot ([place])   Diabetic foot infection ([place])  (please populate well all problems here in Problem List. (For example, if patient is on BP meds at home and you resume or decide to hold them, it is a problem that needs to be her. Same for CAD, COPD, HLD and so on)  Foot cellulitis abscess, suspicious for osteomyelitis -Order MRI, podiatry was called, plan for a.m. OR. -Agreed with vancomycin and Zosyn  Lactic acidosis -Blood pressure stable,  no fever no leukocytosis, suspect elevated lactate related to metformin use.  We will hold metformin for now. -IV fluids x12 hours then reevaluate.  HTN -Changed to as needed BP meds for now.  Diabetic neuropathy -Continue gabapentin  DVT prophylaxis: Lovenox Code Status: Full  Code Family Communication: None at bedside Disposition Plan: Expect more than 2 midnight hospital stay, may need long-term antibiotics. Consults called: Dr. Sharol Given Admission status: Tele admit   Lequita Halt MD [facility] Pager [phone]  08/18/2020, 5:39 PM

## 2020-08-18 NOTE — Plan of Care (Signed)
  Problem: Clinical Measurements: Goal: Respiratory complications will improve Outcome: Progressing   Problem: Clinical Measurements: Goal: Cardiovascular complication will be avoided Outcome: Progressing   Problem: Coping: Goal: Level of anxiety will decrease Outcome: Progressing   

## 2020-08-18 NOTE — Progress Notes (Signed)
IVT consulted with a stat for a PIV assessment.  Contacted Josh, RN to acknowledge IVT has seen the stat and will be there asap.

## 2020-08-19 ENCOUNTER — Other Ambulatory Visit: Payer: Self-pay | Admitting: Physician Assistant

## 2020-08-19 ENCOUNTER — Inpatient Hospital Stay (HOSPITAL_COMMUNITY): Payer: Medicare Other

## 2020-08-19 DIAGNOSIS — M869 Osteomyelitis, unspecified: Secondary | ICD-10-CM

## 2020-08-19 DIAGNOSIS — E11628 Type 2 diabetes mellitus with other skin complications: Secondary | ICD-10-CM | POA: Diagnosis not present

## 2020-08-19 DIAGNOSIS — L089 Local infection of the skin and subcutaneous tissue, unspecified: Secondary | ICD-10-CM

## 2020-08-19 LAB — BASIC METABOLIC PANEL
Anion gap: 7 (ref 5–15)
BUN: 12 mg/dL (ref 8–23)
CO2: 26 mmol/L (ref 22–32)
Calcium: 8.4 mg/dL — ABNORMAL LOW (ref 8.9–10.3)
Chloride: 106 mmol/L (ref 98–111)
Creatinine, Ser: 1.07 mg/dL (ref 0.61–1.24)
GFR, Estimated: >60 mL/min (ref >60)
Glucose, Bld: 162 mg/dL — ABNORMAL HIGH (ref 70–99)
Potassium: 4.5 mmol/L (ref 3.5–5.1)
Sodium: 139 mmol/L (ref 135–145)

## 2020-08-19 LAB — CBC
HCT: 33.6 % — ABNORMAL LOW (ref 39.0–52.0)
Hemoglobin: 10.7 g/dL — ABNORMAL LOW (ref 13.0–17.0)
MCH: 29.2 pg (ref 26.0–34.0)
MCHC: 31.8 g/dL (ref 30.0–36.0)
MCV: 91.6 fL (ref 80.0–100.0)
Platelets: 185 10*3/uL (ref 150–400)
RBC: 3.67 MIL/uL — ABNORMAL LOW (ref 4.22–5.81)
RDW: 14 % (ref 11.5–15.5)
WBC: 7.2 10*3/uL (ref 4.0–10.5)
nRBC: 0 % (ref 0.0–0.2)

## 2020-08-19 LAB — GLUCOSE, CAPILLARY
Glucose-Capillary: 125 mg/dL — ABNORMAL HIGH (ref 70–99)
Glucose-Capillary: 159 mg/dL — ABNORMAL HIGH (ref 70–99)
Glucose-Capillary: 202 mg/dL — ABNORMAL HIGH (ref 70–99)
Glucose-Capillary: 237 mg/dL — ABNORMAL HIGH (ref 70–99)

## 2020-08-19 LAB — URIC ACID: Uric Acid, Serum: 5 mg/dL (ref 3.7–8.6)

## 2020-08-19 MED ORDER — AMLODIPINE BESYLATE 2.5 MG PO TABS
2.5000 mg | ORAL_TABLET | Freq: Every day | ORAL | Status: DC
  Administered 2020-08-19 – 2020-08-22 (×3): 2.5 mg via ORAL
  Filled 2020-08-19 (×3): qty 1

## 2020-08-19 MED ORDER — GLUCERNA SHAKE PO LIQD
237.0000 mL | Freq: Three times a day (TID) | ORAL | Status: DC
  Administered 2020-08-19 – 2020-08-22 (×6): 237 mL via ORAL

## 2020-08-19 MED ORDER — SODIUM CHLORIDE 0.9 % IV SOLN: INTRAVENOUS | Status: AC | PRN

## 2020-08-19 NOTE — Consult Note (Signed)
  ORTHOPAEDIC CONSULTATION  REQUESTING PHYSICIAN: Chiu, Stephen K, MD  Chief Complaint: Purulent drainage ulcer right foot fifth metatarsal head.  HPI: Christian [name] is a 80 y.o. male who presents with purulent abscess fifth metatarsal head right foot.  Patient has a history of uncontrolled type 2 diabetes with hypertension who has undergone several months of conservative care for the right foot.  Patient initially went to urgent care was started on antibiotics and has been followed by podiatry.  Patient states that he has had recurrence of the abscess and purulent drainage.  Past Medical History:  Diagnosis Date  . Arthritis   . Blood transfusion without reported diagnosis    ? in 2017 after crushing injury   . Cataract    right eye removed   . Crushing injury of pelvic region   . Diabetes mellitus without complication (HCC)   . Hyperlipidemia   . Hypertension    Past Surgical History:  Procedure Laterality Date  . ACHILLES TENDON SURGERY Right   . APPLICATION OF A-CELL OF BACK N/A 02/10/2016   Procedure: APPLICATION OF A-CELL OF sacral wound;  Surgeon: Claire S Dillingham, DO;  [facility]: [facility];  Service: Plastics;  Laterality: N/A;  . APPLICATION OF WOUND VAC N/A 02/10/2016   Procedure: APPLICATION OF WOUND VAC possible;  Surgeon: Claire S Dillingham, DO;  Location: [facility];  Service: Plastics;  Laterality: N/A;  . COLONOSCOPY    . I & D EXTREMITY N/A 01/30/2016   Procedure: IRRIGATION AND DEBRIDEMENT OF THE SACRUM;  Surgeon: Claire S Dillingham, DO;  Location: [facility];  Service: Plastics;  Laterality: N/A;  . INCISION AND DRAINAGE OF WOUND N/A 02/10/2016   Procedure: IRRIGATION AND DEBRIDEMENT sacral WOUND;  Surgeon: Claire S Dillingham, DO;  [facility];  Service: Plastics;  Laterality: N/A;  . IR GENERIC HISTORICAL  02/11/2016   IR CATHETER TUBE CHANGE 02/11/2016 Glenn Yamagata, MD [facility]  . IR GENERIC HISTORICAL  03/16/2016   IR CATHETER TUBE CHANGE 03/16/2016 John  Watts, MD [facility]  . IR GENERIC HISTORICAL  05/11/2016   IR CATHETER TUBE CHANGE 05/11/2016 Glenn Yamagata, MD [facility]  . POLYPECTOMY    . SACRO-ILIAC PINNING N/A 01/16/2016   Procedure: TRANS-SACRO-ILIAC PINNING;  Surgeon: Michael Handy, MD;  Location: [facility];  Service: Orthopedics;  Laterality: N/A;  . SUPRAPUBIC CATHETER INSERTION     s/p removal 2018  . TONSILLECTOMY    . ULNAR NERVE TRANSPOSITION Right    Social History   Socioeconomic History  . Marital status: Married    Spouse name: Not on file  . Number of children: Not on file  . Years of education: Not on file  . Highest education level: Not on file  Occupational History  . Not on file  Tobacco Use  . Smoking status: Never Smoker  . Smokeless tobacco: Never Used  Substance and Sexual Activity  . Alcohol use: No  . Drug use: No  . Sexual activity: Not Currently  Other Topics Concern  . Not on file  Social History Narrative   Married 2015   Retired, previous mission work   Prev carpenter/trucker   Social Determinants of Health   Financial Resource Strain: Low Risk   . Difficulty of Paying Living Expenses: Not very hard  Food Insecurity: Not on file  Transportation Needs: Not on file  Physical Activity: Not on file  Stress: Not on file  Social Connections: Not on file   Family History  Problem   Relation Age of Onset  . Lymphoma Mother   . CVA Father   . Dementia Father   . Diabetes Neg Hx   . Prostate cancer Neg Hx   . Colon polyps Neg Hx   . Colon cancer Neg Hx   . Rectal cancer Neg Hx   . Stomach cancer Neg Hx   . Esophageal cancer Neg Hx    - negative except otherwise stated in the family history section No Known Allergies Prior to Admission medications   Medication Sig Start Date End Date Taking? Authorizing Provider  amLODipine (NORVASC) 2.5 MG tablet Take 1 tablet (2.5 mg total) by mouth daily. 04/16/20  Yes Tonia Ghent, MD  atorvastatin (LIPITOR) 80 MG tablet Take 1 tablet (80 mg  total) by mouth at bedtime. 04/16/20  Yes Tonia Ghent, MD  gabapentin (NEURONTIN) 300 MG capsule Take one capsule by mouth every morning and 2 capsules by mouth at bedtime. Patient taking differently: Take 300-600 mg by mouth See admin instructions. Take two capsule by mouth every morning and 1 capsules by mouth at bedtime. 03/08/18  Yes Tonia Ghent, MD  glipiZIDE (GLUCOTROL) 5 MG tablet Take 2 tablets by mouth in the morning and 1 tablet by mouth at night. 04/16/20  Yes Tonia Ghent, MD  insulin detemir (LEVEMIR FLEXTOUCH) 100 UNIT/ML FlexPen INJECT UP TO 20-36 UNITS SUBCUTANEOUSLY ONCE DAILY Patient taking differently: Inject 35 Units into the skin at bedtime. 08/08/20  Yes Tonia Ghent, MD  lisinopril (ZESTRIL) 30 MG tablet Take 1 tablet (30 mg total) by mouth daily. 07/19/20  Yes Tonia Ghent, MD  metFORMIN (GLUCOPHAGE) 1000 MG tablet Take 1 tablet (1,000 mg total) by mouth 2 (two) times daily with a meal. 08/08/20  Yes Tonia Ghent, MD  pantoprazole (PROTONIX) 40 MG tablet Take 1 tablet by mouth twice daily 02/19/20  Yes Tonia Ghent, MD  silver sulfADIAZINE (SILVADENE) 1 % cream Apply pea-sized amount to wound daily. Patient taking differently: Apply 1 application topically daily. Apply pea-sized amount to wound daily. 07/23/20  Yes Evelina Bucy, DPM  glucose blood (ONETOUCH ULTRA) test strip USE 1 STRIP TO CHECK GLUCOSE ONCE DAILY 07/05/20   Tonia Ghent, MD  Insulin Pen Needle (RELION PEN NEEDLE 31G/8MM) 31G X 8 MM MISC USE TO INJECT LEVEMIR DAILY 11/07/19   Tonia Ghent, MD   DG Foot Complete Right  Result Date: 08/18/2020 CLINICAL DATA:  Infection in the right foot. EXAM: RIGHT FOOT COMPLETE - 3+ VIEW COMPARISON:  Foot radiographs dated 07/27/2020. FINDINGS: There is no evidence of fracture or dislocation. There is soft tissue swelling around the fifth metatarsal phalangeal joint. There is no underlying focal osseous demineralization to suggest osteomyelitis.  Degenerative changes are seen in the first digit metatarsal phalangeal joint including hallux valgus deformity. There is diffuse osseous demineralization. Plantar and posterior calcaneal enthesophytes are noted. IMPRESSION: No focal osseous demineralization to suggest osteomyelitis. Electronically Signed   By: Zerita Boers M.D.   On: 08/18/2020 16:00   - pertinent xrays, CT, MRI studies were reviewed and independently interpreted  Positive ROS: All other systems have been reviewed and were otherwise negative with the exception of those mentioned in the HPI and as above.  Physical Exam: General: Alert, no acute distress Psychiatric: Patient is competent for consent with normal mood and affect Lymphatic: No axillary or cervical lymphadenopathy Cardiovascular: No pedal edema Respiratory: No cyanosis, no use of accessory musculature GI: No organomegaly, abdomen is soft  and non-tender    Images:  @ENCIMAGES@  Labs:  Lab Results  Component Value Date   HGBA1C 9.1 (H) 08/08/2020   HGBA1C 9.5 (A) 04/16/2020   HGBA1C 9.8 (H) 01/04/2020   ESRSEDRATE 35 (H) 08/08/2020   REPTSTATUS 05/20/2016 FINAL 05/17/2016   CULT >=100,000 COLONIES/mL ESCHERICHIA COLI (A) 05/17/2016   LABORGA ESCHERICHIA COLI (A) 05/17/2016    Lab Results  Component Value Date   ALBUMIN 3.3 (L) 08/18/2020   ALBUMIN 4.2 01/04/2020   ALBUMIN 4.3 03/01/2018     CBC EXTENDED Latest Ref Rng & Units 08/19/2020 08/18/2020 08/08/2020  WBC 4.0 - 10.5 K/uL 7.2 9.7 8.0  RBC 4.22 - 5.81 MIL/uL 3.67(L) 4.27 4.35  HGB 13.0 - 17.0 g/dL 10.7(L) 12.6(L) 12.6(L)  HCT 39.0 - 52.0 % 33.6(L) 39.4 38.7(L)  PLT 150 - 400 K/uL 185 211 187.0  NEUTROABS 1.7 - 7.7 K/uL - 6.8 4.8  LYMPHSABS 0.7 - 4.0 K/uL - 1.9 2.5    Neurologic: Patient does not have protective sensation bilateral lower extremities.   MUSCULOSKELETAL:   Skin: Examination patient has an ulcer beneath the fifth metatarsal head with swelling cellulitis and purulent  drainage.  The ulcer extends from the plantar aspect and also exits dorsally with a through and through ulcer that probes to bone of the fifth metatarsal head.  Review of the radiographs show some periarticular cystic changes possibly consistent with gout with significant calcification at the insertion of the Achilles as well as plantar heel ulcer.  Patient states he does have a history of gout but is not currently taking any medicine for.  Hemoglobin 10.7 white cell count 7.2 albumin 3.3 with a hemoglobin A1c that runs consistently above 9.  Patient is also status posttreatment for a sacral ulcer and sacral fracture.  Patient has a strong palpable dorsalis pedis and posterior tibial pulse.  Assessment: Assessment: Osteomyelitis and abscess right foot fifth metatarsal head with a history of gout uncontrolled type 2 diabetes and hypertension.  Plan: Plan: I will plan for a right foot fifth ray amputation on Wednesday.  I will check a uric acid.  I will cancel the ankle-brachial indices and will review the MRI scan to see if there is any further involvement with the infection of the right foot other than the fifth ray.  Thank you for the consult and the opportunity to see Mr. Biller  Shaydon Lease, MD [facility] [phone] 6:54 AM     

## 2020-08-19 NOTE — H&P (View-Only) (Signed)
ORTHOPAEDIC CONSULTATION  REQUESTING PHYSICIAN: Christian Hazel, MD  Chief Complaint: Purulent drainage ulcer right foot fifth metatarsal head.  HPI: Christian [name] is a 80 y.o. male who presents with purulent abscess fifth metatarsal head right foot.  Patient has a history of uncontrolled type 2 diabetes with hypertension who has undergone several months of conservative care for the right foot.  Patient initially went to urgent care was started on antibiotics and has been followed by podiatry.  Patient states that he has had recurrence of the abscess and purulent drainage.  Past Medical History:  Diagnosis Date  . Arthritis   . Blood transfusion without reported diagnosis    ? in 2017 after crushing injury   . Cataract    right eye removed   . Crushing injury of pelvic region   . Diabetes mellitus without complication ([place])   . Hyperlipidemia   . Hypertension    Past Surgical History:  Procedure Laterality Date  . ACHILLES TENDON SURGERY Right   . APPLICATION OF A-CELL OF BACK N/A 02/10/2016   Procedure: APPLICATION OF A-CELL OF sacral wound;  Surgeon: Loel Lofty Dillingham, DO;  Location: [facility];  Service: Plastics;  Laterality: N/A;  . APPLICATION OF WOUND VAC N/A 02/10/2016   Procedure: APPLICATION OF WOUND VAC possible;  Surgeon: Loel Lofty Dillingham, DO;  Location: [facility];  Service: Plastics;  Laterality: N/A;  . COLONOSCOPY    . I & D EXTREMITY N/A 01/30/2016   Procedure: IRRIGATION AND DEBRIDEMENT OF THE SACRUM;  Surgeon: Loel Lofty Dillingham, DO;  Location: [facility];  Service: Plastics;  Laterality: N/A;  . INCISION AND DRAINAGE OF WOUND N/A 02/10/2016   Procedure: IRRIGATION AND DEBRIDEMENT sacral WOUND;  Surgeon: Loel Lofty Dillingham, DO;  Location: [facility];  Service: Plastics;  Laterality: N/A;  . IR GENERIC HISTORICAL  02/11/2016   IR CATHETER TUBE CHANGE 02/11/2016 Aletta Edouard, MD [facility]  . IR GENERIC HISTORICAL  03/16/2016   IR CATHETER TUBE CHANGE 03/16/2016 Sandi Mariscal, MD [facility]  . IR GENERIC HISTORICAL  05/11/2016   IR CATHETER TUBE CHANGE 05/11/2016 Aletta Edouard, MD [facility]  . POLYPECTOMY    . SACRO-ILIAC PINNING N/A 01/16/2016   Procedure: Jenetta Downer;  Surgeon: Altamese Sanders, MD;  Location: [facility];  Service: Orthopedics;  Laterality: N/A;  . SUPRAPUBIC CATHETER INSERTION     s/p removal 2018  . TONSILLECTOMY    . ULNAR NERVE TRANSPOSITION Right    Social History   Socioeconomic History  . Marital status: Married    Spouse name: Not on file  . Number of children: Not on file  . Years of education: Not on file  . Highest education level: Not on file  Occupational History  . Not on file  Tobacco Use  . Smoking status: Never Smoker  . Smokeless tobacco: Never Used  Substance and Sexual Activity  . Alcohol use: No  . Drug use: No  . Sexual activity: Not Currently  Other Topics Concern  . 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 Difficulty of Paying Living Expenses: Not very hard  Food Insecurity: Not on file  Transportation Needs: Not on file  Physical Activity: Not on file  Stress: Not on file  Social Connections: Not on file   Family History  Problem  Relation Age of Onset  . Lymphoma Mother   . CVA Father   . Dementia Father   . Diabetes Neg Hx   . Prostate cancer Neg Hx   . Colon polyps Neg Hx   . Colon cancer Neg Hx   . Rectal cancer Neg Hx   . Stomach cancer Neg Hx   . Esophageal cancer Neg Hx    - negative except otherwise stated in the family history section No Known Allergies Prior to Admission medications   Medication Sig Start Date End Date Taking? Authorizing Provider  amLODipine (NORVASC) 2.5 MG tablet Take 1 tablet (2.5 mg total) by mouth daily. 04/16/20  Yes Tonia Ghent, MD  atorvastatin (LIPITOR) 80 MG tablet Take 1 tablet (80 mg  total) by mouth at bedtime. 04/16/20  Yes Tonia Ghent, MD  gabapentin (NEURONTIN) 300 MG capsule Take one capsule by mouth every morning and 2 capsules by mouth at bedtime. Patient taking differently: Take 300-600 mg by mouth See admin instructions. Take two capsule by mouth every morning and 1 capsules by mouth at bedtime. 03/08/18  Yes Tonia Ghent, MD  glipiZIDE (GLUCOTROL) 5 MG tablet Take 2 tablets by mouth in the morning and 1 tablet by mouth at night. 04/16/20  Yes Tonia Ghent, MD  insulin detemir (LEVEMIR FLEXTOUCH) 100 UNIT/ML FlexPen INJECT UP TO 20-36 UNITS SUBCUTANEOUSLY ONCE DAILY Patient taking differently: Inject 35 Units into the skin at bedtime. 08/08/20  Yes Tonia Ghent, MD  lisinopril (ZESTRIL) 30 MG tablet Take 1 tablet (30 mg total) by mouth daily. 07/19/20  Yes Tonia Ghent, MD  metFORMIN (GLUCOPHAGE) 1000 MG tablet Take 1 tablet (1,000 mg total) by mouth 2 (two) times daily with a meal. 08/08/20  Yes Tonia Ghent, MD  pantoprazole (PROTONIX) 40 MG tablet Take 1 tablet by mouth twice daily 02/19/20  Yes Tonia Ghent, MD  silver sulfADIAZINE (SILVADENE) 1 % cream Apply pea-sized amount to wound daily. Patient taking differently: Apply 1 application topically daily. Apply pea-sized amount to wound daily. 07/23/20  Yes Evelina Bucy, DPM  glucose blood (ONETOUCH ULTRA) test strip USE 1 STRIP TO CHECK GLUCOSE ONCE DAILY 07/05/20   Tonia Ghent, MD  Insulin Pen Needle (RELION PEN NEEDLE 31G/8MM) 31G X 8 MM MISC USE TO INJECT LEVEMIR DAILY 11/07/19   Tonia Ghent, MD   DG Foot Complete Right  Result Date: 08/18/2020 CLINICAL DATA:  Infection in the right foot. EXAM: RIGHT FOOT COMPLETE - 3+ VIEW COMPARISON:  Foot radiographs dated 07/27/2020. FINDINGS: There is no evidence of fracture or dislocation. There is soft tissue swelling around the fifth metatarsal phalangeal joint. There is no underlying focal osseous demineralization to suggest osteomyelitis.  Degenerative changes are seen in the first digit metatarsal phalangeal joint including hallux valgus deformity. There is diffuse osseous demineralization. Plantar and posterior calcaneal enthesophytes are noted. IMPRESSION: No focal osseous demineralization to suggest osteomyelitis. Electronically Signed   By: Zerita Boers M.D.   On: 08/18/2020 16:00   - pertinent xrays, CT, MRI studies were reviewed and independently interpreted  Positive ROS: All other systems have been reviewed and were otherwise negative with the exception of those mentioned in the HPI and as above.  Physical Exam: General: Alert, no acute distress Psychiatric: Patient is competent for consent with normal mood and affect Lymphatic: No axillary or cervical lymphadenopathy Cardiovascular: No pedal edema Respiratory: No cyanosis, no use of accessory musculature GI: No organomegaly, abdomen is soft  and non-tender    Images:  @ENCIMAGES @  Labs:  Lab Results  Component Value Date   HGBA1C 9.1 (H) 08/08/2020   HGBA1C 9.5 (A) 04/16/2020   HGBA1C 9.8 (H) 01/04/2020   ESRSEDRATE 35 (H) 08/08/2020   REPTSTATUS 05/20/2016 FINAL 05/17/2016   CULT >=100,000 COLONIES/mL ESCHERICHIA COLI (A) 05/17/2016   LABORGA ESCHERICHIA COLI (A) 05/17/2016    Lab Results  Component Value Date   ALBUMIN 3.3 (L) 08/18/2020   ALBUMIN 4.2 01/04/2020   ALBUMIN 4.3 03/01/2018     CBC EXTENDED Latest Ref Rng & Units 08/19/2020 08/18/2020 08/08/2020  WBC 4.0 - 10.5 K/uL 7.2 9.7 8.0  RBC 4.22 - 5.81 MIL/uL 3.67(L) 4.27 4.35  HGB 13.0 - 17.0 g/dL 10.7(L) 12.6(L) 12.6(L)  HCT 39.0 - 52.0 % 33.6(L) 39.4 38.7(L)  PLT 150 - 400 K/uL 185 211 187.0  NEUTROABS 1.7 - 7.7 K/uL - 6.8 4.8  LYMPHSABS 0.7 - 4.0 K/uL - 1.9 2.5    Neurologic: Patient does not have protective sensation bilateral lower extremities.   MUSCULOSKELETAL:   Skin: Examination patient has an ulcer beneath the fifth metatarsal head with swelling cellulitis and purulent  drainage.  The ulcer extends from the plantar aspect and also exits dorsally with a through and through ulcer that probes to bone of the fifth metatarsal head.  Review of the radiographs show some periarticular cystic changes possibly consistent with gout with significant calcification at the insertion of the Achilles as well as plantar heel ulcer.  Patient states he does have a history of gout but is not currently taking any medicine for.  Hemoglobin 10.7 white cell count 7.2 albumin 3.3 with a hemoglobin A1c that runs consistently above 9.  Patient is also status posttreatment for a sacral ulcer and sacral fracture.  Patient has a strong palpable dorsalis pedis and posterior tibial pulse.  Assessment: Assessment: Osteomyelitis and abscess right foot fifth metatarsal head with a history of gout uncontrolled type 2 diabetes and hypertension.  Plan: Plan: I will plan for a right foot fifth ray amputation on Wednesday.  I will check a uric acid.  I will cancel the ankle-brachial indices and will review the MRI scan to see if there is any further involvement with the infection of the right foot other than the fifth ray.  Thank you for the consult and the opportunity to see Mr. Greig Altergott, [place] [phone] 6:54 AM

## 2020-08-19 NOTE — Progress Notes (Signed)
PROGRESS NOTE    Christian [name]  FTD:[id number] DOB: 1940-10-30 DOA: 08/18/2020 PCP: Tonia Ghent, MD    Brief Narrative:  80 y.o. male with medical history significant of HTN, IDDM, diabetic neuropathy, HLD, presented with worsening of right foot infection  Assessment & Plan:   Active Problems:   Cellulitis in diabetic foot (HCC)   Diabetic foot infection ([place])   Osteomyelitis of fifth toe of right foot (HCC)  Osteomyelitis and abscess of R metatarsal head ruled in with cellulitis -Pt was continued on empiric vancomycin and Zosyn -Orthopedic Surgery consulted with recommendation for R foot ray amputation Wednesday -MRI R foot is pending -F/u on bmet and cbc in AM  Lactic acidosis -Blood pressure stable, no fever no leukocytosis, suspect elevated lactate related to metformin use.  Metformin on hold for now. -Pt given IVF hydration overnight  HTN -On norvasc and lisinopril prior to admit -BP is stable, albeit suboptimally controlled -Will resume norvasc for now, anticipate titrating bp regimen as tolerated  Diabetic neuropathy -Continue gabapentin   DVT prophylaxis: Lovenox subQ Code Status: Full Family Communication: Pt in room, family not at bedside  Status is: Inpatient  Remains inpatient appropriate because:Ongoing diagnostic testing needed not appropriate for outpatient work up and Inpatient level of care appropriate due to severity of illness   Dispo: The patient is from: Home              Anticipated d/c is to: Home              Patient currently is not medically stable to d/c.   Difficult to place patient No       Consultants:   Orthopedic Surgery  Procedures:     Antimicrobials: Anti-infectives (From admission, onward)   Start     Dose/Rate Route Frequency Ordered Stop   08/19/20 1000  vancomycin (VANCOREADY) IVPB 1000 mg/200 mL        1,000 mg 200 mL/hr over 60 Minutes Intravenous Every 12 hours 08/18/20 1632     08/18/20 2300   piperacillin-tazobactam (ZOSYN) IVPB 3.375 g        3.375 g 12.5 mL/hr over 240 Minutes Intravenous Every 8 hours 08/18/20 1750     08/18/20 1645  vancomycin (VANCOREADY) IVPB 2000 mg/400 mL        2,000 mg 200 mL/hr over 120 Minutes Intravenous  Once 08/18/20 1632 08/19/20 0609   08/18/20 1630  piperacillin-tazobactam (ZOSYN) IVPB 3.375 g        3.375 g 100 mL/hr over 30 Minutes Intravenous  Once 08/18/20 1626 08/18/20 1840       Subjective: Without complaints  Objective: Vitals:   08/18/20 2219 08/19/20 0413 08/19/20 0906 08/19/20 1359  BP: 138/82 126/72 (!) 141/87 137/77  Pulse: 72 71 73 66  Resp: 18 16  16   Temp: 97.9 F (36.6 C) 98.2 F (36.8 C)    TempSrc: Oral Oral    SpO2: 100% 96% 100% 97%    Intake/Output Summary (Last 24 hours) at 08/19/2020 1544 Last data filed at 08/19/2020 0410 Gross per 24 hour  Intake 46.02 ml  Output 600 ml  Net -553.98 ml   There were no vitals filed for this visit.  Examination: General exam: Awake, laying in bed, in nad Respiratory system: Normal respiratory effort, no wheezing Cardiovascular system: regular rate, s1, s2 Gastrointestinal system: Soft, nondistended, positive BS Central nervous system: CN2-12 grossly intact, strength intact Extremities: Perfused, no clubbing, lateral aspect of R foot with ulceration and purulence  Skin: Normal skin turgor, no notable skin lesions seen Psychiatry: Mood normal // no visual hallucinations   Data Reviewed: I have personally reviewed following labs and imaging studies  CBC: Recent Labs  Lab 08/18/20 1324 08/19/20 0402  WBC 9.7 7.2  NEUTROABS 6.8  --   HGB 12.6* 10.7*  HCT 39.4 33.6*  MCV 92.3 91.6  PLT 211 161   Basic Metabolic Panel: Recent Labs  Lab 08/18/20 1324 08/19/20 0402  NA 139 139  K 4.3 4.5  CL 108 106  CO2 20* 26  GLUCOSE 140* 162*  BUN 14 12  CREATININE 1.04 1.07  CALCIUM 8.9 8.4*   GFR: Estimated Creatinine Clearance: 65.1 mL/min (by C-G formula based  on SCr of 1.07 mg/dL). Liver Function Tests: Recent Labs  Lab 08/18/20 1324  AST 23  ALT 18  ALKPHOS 55  BILITOT 0.9  PROT 6.7  ALBUMIN 3.3*   No results for input(s): LIPASE, AMYLASE in the last 168 hours. No results for input(s): AMMONIA in the last 168 hours. Coagulation Profile: No results for input(s): INR, PROTIME in the last 168 hours. Cardiac Enzymes: No results for input(s): CKTOTAL, CKMB, CKMBINDEX, TROPONINI in the last 168 hours. BNP (last 3 results) No results for input(s): PROBNP in the last 8760 hours. HbA1C: No results for input(s): HGBA1C in the last 72 hours. CBG: Recent Labs  Lab 08/18/20 2254 08/19/20 0757 08/19/20 1144  GLUCAP 211* 125* 159*   Lipid Profile: No results for input(s): CHOL, HDL, LDLCALC, TRIG, CHOLHDL, LDLDIRECT in the last 72 hours. Thyroid Function Tests: No results for input(s): TSH, T4TOTAL, FREET4, T3FREE, THYROIDAB in the last 72 hours. Anemia Panel: No results for input(s): VITAMINB12, FOLATE, FERRITIN, TIBC, IRON, RETICCTPCT in the last 72 hours. Sepsis Labs: Recent Labs  Lab 08/18/20 1324 08/18/20 1747  LATICACIDVEN 4.0* 3.2*    Recent Results (from the past 240 hour(s))  Resp Panel by RT-PCR (Flu A&B, Covid) Nasopharyngeal Swab     Status: None   Collection Time: 08/18/20  5:47 PM   Specimen: Nasopharyngeal Swab; Nasopharyngeal(NP) swabs in vial transport medium  Result Value Ref Range Status   SARS Coronavirus 2 by RT PCR NEGATIVE NEGATIVE Final    Comment: (NOTE) SARS-CoV-2 target nucleic acids are NOT DETECTED.  The SARS-CoV-2 RNA is generally detectable in upper respiratory specimens during the acute phase of infection. The lowest concentration of SARS-CoV-2 viral copies this assay can detect is 138 copies/mL. A negative result does not preclude SARS-Cov-2 infection and should not be used as the sole basis for treatment or other patient management decisions. A negative result may occur with  improper specimen  collection/handling, submission of specimen other than nasopharyngeal swab, presence of viral mutation(s) within the areas targeted by this assay, and inadequate number of viral copies(<138 copies/mL). A negative result must be combined with clinical observations, patient history, and epidemiological information. The expected result is Negative.  Fact Sheet for Patients:  EntrepreneurPulse.com.au  Fact Sheet for Healthcare Providers:  IncredibleEmployment.be  This test is no t yet approved or cleared by the [place] FDA and  has been authorized for detection and/or diagnosis of SARS-CoV-2 by FDA under an Emergency Use Authorization (EUA). This EUA will remain  in effect (meaning this test can be used) for the duration of the COVID-19 declaration under Section 564(b)(1) of the Act, 21 U.S.C.section 360bbb-3(b)(1), unless the authorization is terminated  or revoked sooner.       Influenza A by PCR NEGATIVE NEGATIVE Final  Influenza B by PCR NEGATIVE NEGATIVE Final    Comment: (NOTE) The Xpert Xpress SARS-CoV-2/FLU/RSV plus assay is intended as an aid in the diagnosis of influenza from Nasopharyngeal swab specimens and should not be used as a sole basis for treatment. Nasal washings and aspirates are unacceptable for Xpert Xpress SARS-CoV-2/FLU/RSV testing.  Fact Sheet for Patients: EntrepreneurPulse.com.au  Fact Sheet for Healthcare Providers: IncredibleEmployment.be  This test is not yet approved or cleared by the [place] FDA and has been authorized for detection and/or diagnosis of SARS-CoV-2 by FDA under an Emergency Use Authorization (EUA). This EUA will remain in effect (meaning this test can be used) for the duration of the COVID-19 declaration under Section 564(b)(1) of the Act, 21 U.S.C. section 360bbb-3(b)(1), unless the authorization is terminated or revoked.  Performed at [facility], [place] [address] [place], [place] [address]      Radiology Studies: DG Foot Complete Right  Result Date: 08/18/2020 CLINICAL DATA:  Infection in the right foot. EXAM: RIGHT FOOT COMPLETE - 3+ VIEW COMPARISON:  Foot radiographs dated 07/27/2020. FINDINGS: There is no evidence of fracture or dislocation. There is soft tissue swelling around the fifth metatarsal phalangeal joint. There is no underlying focal osseous demineralization to suggest osteomyelitis. Degenerative changes are seen in the first digit metatarsal phalangeal joint including hallux valgus deformity. There is diffuse osseous demineralization. Plantar and posterior calcaneal enthesophytes are noted. IMPRESSION: No focal osseous demineralization to suggest osteomyelitis. Electronically Signed   By: Zerita Boers M.D.   On: 08/18/2020 16:00    Scheduled Meds: . atorvastatin  80 mg Oral QHS  . enoxaparin (LOVENOX) injection  40 mg Subcutaneous Q24H  . gabapentin  200 mg Oral BID  . insulin aspart  0-20 Units Subcutaneous TID WC  . insulin detemir  20 Units Subcutaneous Q2200  . lactobacillus  1 g Oral TID WC  . pantoprazole  40 mg Oral BID   Continuous Infusions: . sodium chloride 10 mL/hr at 08/19/20 0855  . piperacillin-tazobactam (ZOSYN)  IV 3.375 g (08/19/20 1418)  . vancomycin 1,000 mg (08/19/20 0856)     LOS: 1 day   Marylu Lund, MD [facility] Pager On Amion  If 7PM-7AM, please contact night-coverage 08/19/2020, 3:44 PM

## 2020-08-20 DIAGNOSIS — L03119 Cellulitis of unspecified part of limb: Secondary | ICD-10-CM

## 2020-08-20 LAB — GLUCOSE, CAPILLARY
Glucose-Capillary: 175 mg/dL — ABNORMAL HIGH (ref 70–99)
Glucose-Capillary: 242 mg/dL — ABNORMAL HIGH (ref 70–99)
Glucose-Capillary: 209 mg/dL — ABNORMAL HIGH (ref 70–99)
Glucose-Capillary: 222 mg/dL — ABNORMAL HIGH (ref 70–99)

## 2020-08-20 LAB — BASIC METABOLIC PANEL
Anion gap: 8 (ref 5–15)
BUN: 14 mg/dL (ref 8–23)
CO2: 27 mmol/L (ref 22–32)
Calcium: 8.7 mg/dL — ABNORMAL LOW (ref 8.9–10.3)
Chloride: 104 mmol/L (ref 98–111)
Creatinine, Ser: 1.2 mg/dL (ref 0.61–1.24)
GFR, Estimated: >60 mL/min (ref >60)
Glucose, Bld: 304 mg/dL — ABNORMAL HIGH (ref 70–99)
Potassium: 5 mmol/L (ref 3.5–5.1)
Sodium: 139 mmol/L (ref 135–145)

## 2020-08-20 MED ORDER — INSULIN ASPART 100 UNIT/ML IJ SOLN
3.0000 [IU] | Freq: Three times a day (TID) | INTRAMUSCULAR | Status: DC
  Administered 2020-08-20 – 2020-08-22 (×6): 3 [IU] via SUBCUTANEOUS

## 2020-08-20 MED ORDER — SODIUM CHLORIDE 0.9 % IV SOLN
2.0000 g | Freq: Two times a day (BID) | INTRAVENOUS | Status: DC
  Administered 2020-08-20 – 2020-08-22 (×5): 2 g via INTRAVENOUS
  Filled 2020-08-20 (×6): qty 2

## 2020-08-20 NOTE — Progress Notes (Signed)
PROGRESS NOTE    Christian [name]  [id number] DOB: 1941-01-09 DOA: 08/18/2020 PCP: Tonia Ghent, MD    Brief Narrative:  80 y.o. male with medical history significant of HTN, IDDM, diabetic neuropathy, HLD, presented with worsening of right foot infection  Assessment & Plan:   Active Problems:   Cellulitis in diabetic foot (HCC)   Diabetic foot infection ([place])   Osteomyelitis of fifth toe of right foot (HCC)  Osteomyelitis and abscess of R metatarsal head ruled in with cellulitis -Pt was continued on empiric vancomycin and Zosyn. Will change to vanc with cefepime to reduce risk for ATN -Orthopedic Surgery consulted with recommendation for R foot ray amputation Wednesday -MRI R foot reviewed, with findings of open wound and subcutaneous abscess with septic arthritis and osteomyelitis involving the 5th MTP joint and diffuse cellulitis and myositis -F/u on bmet and cbc in AM  Lactic acidosis -Blood pressure stable, no fever no leukocytosis, suspect elevated lactate related to metformin use.  Metformin on hold for now. -Pt was given IVF hydration  HTN -On norvasc and lisinopril prior to admit -BP is stable, tolerating norvasc -continue to titrate bp meds as tolerated  Diabetic neuropathy -Continue gabapentin   DVT prophylaxis: Lovenox subQ Code Status: Full Family Communication: Pt in room, family not at bedside  Status is: Inpatient  Remains inpatient appropriate because:Ongoing diagnostic testing needed not appropriate for outpatient work up and Inpatient level of care appropriate due to severity of illness   Dispo: The patient is from: Home              Anticipated d/c is to: Home              Patient currently is not medically stable to d/c.   Difficult to place patient No       Consultants:   Orthopedic Surgery  Procedures:     Antimicrobials: Anti-infectives (From admission, onward)   Start     Dose/Rate Route Frequency Ordered Stop    08/20/20 0900  ceFEPIme (MAXIPIME) 2 g in sodium chloride 0.9 % 100 mL IVPB        2 g 200 mL/hr over 30 Minutes Intravenous Every 12 hours 08/20/20 0822     08/19/20 1000  vancomycin (VANCOREADY) IVPB 1000 mg/200 mL        1,000 mg 200 mL/hr over 60 Minutes Intravenous Every 12 hours 08/18/20 1632     08/18/20 2300  piperacillin-tazobactam (ZOSYN) IVPB 3.375 g  Status:  Discontinued        3.375 g 12.5 mL/hr over 240 Minutes Intravenous Every 8 hours 08/18/20 1750 08/20/20 0749   08/18/20 1645  vancomycin (VANCOREADY) IVPB 2000 mg/400 mL        2,000 mg 200 mL/hr over 120 Minutes Intravenous  Once 08/18/20 1632 08/19/20 0609   08/18/20 1630  piperacillin-tazobactam (ZOSYN) IVPB 3.375 g        3.375 g 100 mL/hr over 30 Minutes Intravenous  Once 08/18/20 1626 08/18/20 1840      Subjective: Without complaints  Objective: Vitals:   08/19/20 2215 08/20/20 0444 08/20/20 0926 08/20/20 0927  BP: 126/89 126/81  134/80  Pulse: 73 67  75  Resp: 20 18  17   Temp: 98.1 F (36.7 C) 98.4 F (36.9 C) 98 F (36.7 C)   TempSrc:   Oral   SpO2: 98% 97%  97%    Intake/Output Summary (Last 24 hours) at 08/20/2020 1304 Last data filed at 08/20/2020 0906 Gross per 24 hour  Intake 1683.87 ml  Output 900 ml  Net 783.87 ml   There were no vitals filed for this visit.  Examination: General exam: Conversant, in no acute distress Respiratory system: normal chest rise, clear, no audible wheezing Cardiovascular system: regular rhythm, s1-s2 Gastrointestinal system: Nondistended, nontender, pos BS Central nervous system: No seizures, no tremors Extremities: No cyanosis, no joint deformities Skin: No rashes, no pallor Psychiatry: Affect normal // no auditory hallucinations   Data Reviewed: I have personally reviewed following labs and imaging studies  CBC: Recent Labs  Lab 08/18/20 1324 08/19/20 0402  WBC 9.7 7.2  NEUTROABS 6.8  --   HGB 12.6* 10.7*  HCT 39.4 33.6*  MCV 92.3 91.6  PLT  211 676   Basic Metabolic Panel: Recent Labs  Lab 08/18/20 1324 08/19/20 0402 08/20/20 0155  NA 139 139 139  K 4.3 4.5 5.0  CL 108 106 104  CO2 20* 26 27  GLUCOSE 140* 162* 304*  BUN 14 12 14   CREATININE 1.04 1.07 1.20  CALCIUM 8.9 8.4* 8.7*   GFR: Estimated Creatinine Clearance: 58 mL/min (by C-G formula based on SCr of 1.2 mg/dL). Liver Function Tests: Recent Labs  Lab 08/18/20 1324  AST 23  ALT 18  ALKPHOS 55  BILITOT 0.9  PROT 6.7  ALBUMIN 3.3*   No results for input(s): LIPASE, AMYLASE in the last 168 hours. No results for input(s): AMMONIA in the last 168 hours. Coagulation Profile: No results for input(s): INR, PROTIME in the last 168 hours. Cardiac Enzymes: No results for input(s): CKTOTAL, CKMB, CKMBINDEX, TROPONINI in the last 168 hours. BNP (last 3 results) No results for input(s): PROBNP in the last 8760 hours. HbA1C: No results for input(s): HGBA1C in the last 72 hours. CBG: Recent Labs  Lab 08/19/20 1144 08/19/20 1646 08/19/20 2205 08/20/20 0738 08/20/20 1157  GLUCAP 159* 202* 237* 175* 242*   Lipid Profile: No results for input(s): CHOL, HDL, LDLCALC, TRIG, CHOLHDL, LDLDIRECT in the last 72 hours. Thyroid Function Tests: No results for input(s): TSH, T4TOTAL, FREET4, T3FREE, THYROIDAB in the last 72 hours. Anemia Panel: No results for input(s): VITAMINB12, FOLATE, FERRITIN, TIBC, IRON, RETICCTPCT in the last 72 hours. Sepsis Labs: Recent Labs  Lab 08/18/20 1324 08/18/20 1747  LATICACIDVEN 4.0* 3.2*    Recent Results (from the past 240 hour(s))  Resp Panel by RT-PCR (Flu A&B, Covid) Nasopharyngeal Swab     Status: None   Collection Time: 08/18/20  5:47 PM   Specimen: Nasopharyngeal Swab; Nasopharyngeal(NP) swabs in vial transport medium  Result Value Ref Range Status   SARS Coronavirus 2 by RT PCR NEGATIVE NEGATIVE Final    Comment: (NOTE) SARS-CoV-2 target nucleic acids are NOT DETECTED.  The SARS-CoV-2 RNA is generally  detectable in upper respiratory specimens during the acute phase of infection. The lowest concentration of SARS-CoV-2 viral copies this assay can detect is 138 copies/mL. A negative result does not preclude SARS-Cov-2 infection and should not be used as the sole basis for treatment or other patient management decisions. A negative result may occur with  improper specimen collection/handling, submission of specimen other than nasopharyngeal swab, presence of viral mutation(s) within the areas targeted by this assay, and inadequate number of viral copies(<138 copies/mL). A negative result must be combined with clinical observations, patient history, and epidemiological information. The expected result is Negative.  Fact Sheet for Patients:  EntrepreneurPulse.com.au  Fact Sheet for Healthcare Providers:  IncredibleEmployment.be  This test is no t yet approved or cleared by the  [place] Architectural technologist and  has been authorized for detection and/or diagnosis of SARS-CoV-2 by FDA under an Print production planner (EUA). This EUA will remain  in effect (meaning this test can be used) for the duration of the COVID-19 declaration under Section 564(b)(1) of the Act, 21 U.S.C.section 360bbb-3(b)(1), unless the authorization is terminated  or revoked sooner.       Influenza A by PCR NEGATIVE NEGATIVE Final   Influenza B by PCR NEGATIVE NEGATIVE Final    Comment: (NOTE) The Xpert Xpress SARS-CoV-2/FLU/RSV plus assay is intended as an aid in the diagnosis of influenza from Nasopharyngeal swab specimens and should not be used as a sole basis for treatment. Nasal washings and aspirates are unacceptable for Xpert Xpress SARS-CoV-2/FLU/RSV testing.  Fact Sheet for Patients: EntrepreneurPulse.com.au  Fact Sheet for Healthcare Providers: IncredibleEmployment.be  This test is not yet approved or cleared by the [place] FDA  and has been authorized for detection and/or diagnosis of SARS-CoV-2 by FDA under an Emergency Use Authorization (EUA). This EUA will remain in effect (meaning this test can be used) for the duration of the COVID-19 declaration under Section 564(b)(1) of the Act, 21 U.S.C. section 360bbb-3(b)(1), unless the authorization is terminated or revoked.  Performed at [facility], [place] [address], [place], [place] [address]      Radiology Studies: MR FOOT RIGHT WO CONTRAST  Result Date: 08/19/2020 CLINICAL DATA:  Diabetic foot ulcer. EXAM: MRI OF THE RIGHT FOREFOOT WITHOUT CONTRAST TECHNIQUE: Multiplanar, multisequence MR imaging of the right foot was performed. No intravenous contrast was administered. COMPARISON:  Radiographs 08/18/2020 FINDINGS: There is an open wound along the lateral aspect of the forefoot located at the level of the fifth MTP joint. There is a complex fluid collection wrapping around the region of the metacarpal head consistent with a soft tissue abscess. There is also a joint effusion and abnormal signal intensity in the fifth metatarsal head consistent with septic arthritis and osteomyelitis. Diffuse cellulitis and myositis without findings for pyomyositis. Moderate to advanced degenerative changes at the first MTP joint. I do not see any other definite sites of osteomyelitis or septic arthritis. IMPRESSION: 1. Open wound along the lateral aspect of the forefoot at the level of the fifth MTP joint. Underlying horseshoe shaped subcutaneous abscess. 2. Septic arthritis and osteomyelitis involving the fifth MTP joint. 3. Diffuse cellulitis and myositis without findings for pyomyositis. Electronically Signed   By: Marijo Sanes M.D.   On: 08/19/2020 18:58   DG Foot Complete Right  Result Date: 08/18/2020 CLINICAL DATA:  Infection in the right foot. EXAM: RIGHT FOOT COMPLETE - 3+ VIEW COMPARISON:  Foot radiographs dated 07/27/2020. FINDINGS: There is no evidence of fracture or  dislocation. There is soft tissue swelling around the fifth metatarsal phalangeal joint. There is no underlying focal osseous demineralization to suggest osteomyelitis. Degenerative changes are seen in the first digit metatarsal phalangeal joint including hallux valgus deformity. There is diffuse osseous demineralization. Plantar and posterior calcaneal enthesophytes are noted. IMPRESSION: No focal osseous demineralization to suggest osteomyelitis. Electronically Signed   By: Zerita Boers M.D.   On: 08/18/2020 16:00    Scheduled Meds: . amLODipine  2.5 mg Oral Daily  . atorvastatin  80 mg Oral QHS  . enoxaparin (LOVENOX) injection  40 mg Subcutaneous Q24H  . feeding supplement (GLUCERNA SHAKE)  237 mL Oral TID BM  . gabapentin  200 mg Oral BID  . insulin aspart  0-20 Units Subcutaneous TID WC  . insulin  detemir  20 Units Subcutaneous Q2200  . lactobacillus  1 g Oral TID WC  . pantoprazole  40 mg Oral BID   Continuous Infusions: . ceFEPime (MAXIPIME) IV 2 g (08/20/20 0931)  . vancomycin 1,000 mg (08/20/20 1053)     LOS: 2 days   Marylu Lund, MD [facility] Pager On Amion  If 7PM-7AM, please contact night-coverage 08/20/2020, 1:04 PM

## 2020-08-20 NOTE — Progress Notes (Signed)
Inpatient Diabetes Program Recommendations  AACE/ADA: New Consensus Statement on Inpatient Glycemic Control (2015)  Target Ranges:  Prepandial:   less than 140 mg/dL      Peak postprandial:   less than 180 mg/dL (1-2 hours)      Critically ill patients:  140 - 180 mg/dL   Lab Results  Component Value Date   GLUCAP 242 (H) 08/20/2020   HGBA1C 9.1 (H) 08/08/2020    Review of Glycemic Control Results for Christian [name], Christian [name] (MRN [id number]) as of 08/20/2020 14:26  Ref. Range 08/19/2020 16:46 08/19/2020 22:05 08/20/2020 07:38 08/20/2020 11:57  Glucose-Capillary Latest Ref Range: 70 - 99 mg/dL 202 (H) 237 (H) 175 (H) 242 (H)   Diabetes history: DM 2 Outpatient Diabetes medications:  Glucotrol 5 mg bid, Levemir 35 units q HS, Metformin 1000 mg bid Current orders for Inpatient glycemic control:  Novolog 0-20 units tid with meals Levemir 20 units q HS Inpatient Diabetes Program Recommendations:    Please consider adding Novolog meal coverage 3 units tid with meals (hold if patient eats less than 50% or NPO).   Thanks  Adah Perl, RN, BC-ADM Inpatient Diabetes Coordinator Pager [phone] (8a-5p)

## 2020-08-20 NOTE — Progress Notes (Signed)
Patient ID: Christian [name], male   DOB: 11-Jul-1940, 80 y.o.   MRN: [id number] Patient is seen in follow-up status post MRI scan right foot.  Review of the MRI scan shows a circumferential abscess of the little toe MTP joint right foot.  No abscess extending proximally or to the dorsal or plantar aspect of the right foot.  We will plan for a right fifth ray amputation tomorrow.

## 2020-08-20 NOTE — Progress Notes (Signed)
Pharmacy Antibiotic Note  Christian [name] is a 80 y.o. male admitted on 08/18/2020 with cellulitis, LA 4.  Pharmacy has been consulted for vancomycin dosing.  Zosyn per MD  Zosyn to be changed to cefepime. Scr increased to 1.2. Wbc wnl.  Plan: Continue vanc 1g IV q12 Cefepime 2g IV q12 F/u with abx post amputation  [name], PharmD, BCIDP, AAHIVP, CPP Infectious Disease Pharmacist 08/20/2020 8:15 AM                          . .. . . . . ++ 0`````````                     1000 mg IV q12h (eAUC 470, Goal AUC 400-550, SCr 1.04) Monitor renal function, clinical progression to narrow Vancomycin levels as needed  Add Zosyn 3.375g IV every 8 hours (Extended infusion) for wound infection      Temp (24hrs), Avg:98.3 F (36.8 C), Min:98.1 F (36.7 C), Max:98.4 F (36.9 C)  Recent Labs  Lab 08/18/20 1324 08/18/20 1747 08/19/20 0402 08/20/20 0155  WBC 9.7  --  7.2  --   CREATININE 1.04  --  1.07 1.20  LATICACIDVEN 4.0* 3.2*  --   --     Estimated Creatinine Clearance: 58 mL/min (by C-G formula based on SCr of 1.2 mg/dL).    No Known Allergies  [name], PharmD Clinical Pharmacist ED Pharmacist Phone # [phone] 08/20/2020 8:13 AM

## 2020-08-20 NOTE — Plan of Care (Signed)

## 2020-08-21 ENCOUNTER — Inpatient Hospital Stay (HOSPITAL_COMMUNITY): Payer: Medicare Other | Admitting: Registered Nurse

## 2020-08-21 ENCOUNTER — Encounter (HOSPITAL_COMMUNITY)
Admission: EM | Disposition: A | Discharge status: Home Health | Payer: Self-pay | Source: Home / Self Care | Attending: Internal Medicine

## 2020-08-21 ENCOUNTER — Encounter (HOSPITAL_COMMUNITY): Payer: Self-pay | Admitting: Internal Medicine

## 2020-08-21 HISTORY — PX: AMPUTATION: SHX166

## 2020-08-21 LAB — GLUCOSE, CAPILLARY
Glucose-Capillary: 205 mg/dL — ABNORMAL HIGH (ref 70–99)
Glucose-Capillary: 180 mg/dL — ABNORMAL HIGH (ref 70–99)
Glucose-Capillary: 163 mg/dL — ABNORMAL HIGH (ref 70–99)
Glucose-Capillary: 248 mg/dL — ABNORMAL HIGH (ref 70–99)
Glucose-Capillary: 389 mg/dL — ABNORMAL HIGH (ref 70–99)

## 2020-08-21 SURGERY — AMPUTATION, FOOT, RAY
Anesthesia: Monitor Anesthesia Care | Laterality: Right

## 2020-08-21 MED ORDER — OXYCODONE HCL 5 MG PO TABS: 5.0000 mg | ORAL_TABLET | Freq: Once | ORAL | Status: DC | PRN

## 2020-08-21 MED ORDER — ONDANSETRON HCL 4 MG/2ML IJ SOLN
INTRAMUSCULAR | Status: DC | PRN
  Administered 2020-08-21: 4 mg via INTRAVENOUS

## 2020-08-21 MED ORDER — PHENOL 1.4 % MT LIQD: 1.0000 | OROMUCOSAL | Status: DC | PRN

## 2020-08-21 MED ORDER — FENTANYL CITRATE (PF) 250 MCG/5ML IJ SOLN
INTRAMUSCULAR | Status: DC | PRN
  Administered 2020-08-21: 25 ug via INTRAVENOUS

## 2020-08-21 MED ORDER — LIDOCAINE 2% (20 MG/ML) 5 ML SYRINGE
INTRAMUSCULAR | Status: DC | PRN
  Administered 2020-08-21: 40 mg via INTRAVENOUS

## 2020-08-21 MED ORDER — MAGNESIUM CITRATE PO SOLN: 1.0000 | Freq: Once | ORAL | Status: DC | PRN

## 2020-08-21 MED ORDER — LACTATED RINGERS IV SOLN: INTRAVENOUS | Status: DC | PRN

## 2020-08-21 MED ORDER — ASCORBIC ACID 500 MG PO TABS
1000.0000 mg | ORAL_TABLET | Freq: Every day | ORAL | Status: DC
  Administered 2020-08-21 – 2020-08-22 (×2): 1000 mg via ORAL
  Filled 2020-08-21 (×2): qty 2

## 2020-08-21 MED ORDER — PROPOFOL 10 MG/ML IV BOLUS
INTRAVENOUS | Status: AC
  Filled 2020-08-21: qty 20

## 2020-08-21 MED ORDER — ONDANSETRON HCL 4 MG/2ML IJ SOLN
INTRAMUSCULAR | Status: AC
  Filled 2020-08-21: qty 2

## 2020-08-21 MED ORDER — ONDANSETRON HCL 4 MG/2ML IJ SOLN
4.0000 mg | Freq: Once | INTRAMUSCULAR | Status: AC | PRN
  Administered 2020-08-21: 4 mg via INTRAVENOUS

## 2020-08-21 MED ORDER — ENOXAPARIN SODIUM 40 MG/0.4ML IJ SOSY
40.0000 mg | PREFILLED_SYRINGE | INTRAMUSCULAR | Status: DC
  Administered 2020-08-22: 40 mg via SUBCUTANEOUS
  Filled 2020-08-21: qty 0.4

## 2020-08-21 MED ORDER — HYDROMORPHONE HCL 1 MG/ML IJ SOLN: 0.5000 mg | INTRAMUSCULAR | Status: DC | PRN

## 2020-08-21 MED ORDER — DEXAMETHASONE SODIUM PHOSPHATE 10 MG/ML IJ SOLN
INTRAMUSCULAR | Status: DC | PRN
  Administered 2020-08-21: 5 mg via INTRAVENOUS

## 2020-08-21 MED ORDER — CHLORHEXIDINE GLUCONATE 0.12 % MT SOLN
OROMUCOSAL | Status: AC
  Administered 2020-08-21: 15 mL
  Filled 2020-08-21: qty 15

## 2020-08-21 MED ORDER — DOCUSATE SODIUM 100 MG PO CAPS
100.0000 mg | ORAL_CAPSULE | Freq: Every day | ORAL | Status: DC
  Administered 2020-08-22: 100 mg via ORAL
  Filled 2020-08-21: qty 1

## 2020-08-21 MED ORDER — FENTANYL CITRATE (PF) 250 MCG/5ML IJ SOLN
INTRAMUSCULAR | Status: AC
  Filled 2020-08-21: qty 5

## 2020-08-21 MED ORDER — PROPOFOL 10 MG/ML IV BOLUS
INTRAVENOUS | Status: DC | PRN
  Administered 2020-08-21: 30 mg via INTRAVENOUS

## 2020-08-21 MED ORDER — FENTANYL CITRATE (PF) 100 MCG/2ML IJ SOLN
25.0000 ug | INTRAMUSCULAR | Status: DC | PRN
  Administered 2020-08-21: 25 ug via INTRAVENOUS

## 2020-08-21 MED ORDER — CEFAZOLIN SODIUM-DEXTROSE 2-4 GM/100ML-% IV SOLN
2.0000 g | INTRAVENOUS | Status: AC
  Administered 2020-08-21: 2 g via INTRAVENOUS
  Filled 2020-08-21: qty 100

## 2020-08-21 MED ORDER — 0.9 % SODIUM CHLORIDE (POUR BTL) OPTIME
TOPICAL | Status: DC | PRN
  Administered 2020-08-21: 1000 mL

## 2020-08-21 MED ORDER — BISACODYL 5 MG PO TBEC: 5.0000 mg | DELAYED_RELEASE_TABLET | Freq: Every day | ORAL | Status: DC | PRN

## 2020-08-21 MED ORDER — SODIUM CHLORIDE 0.9 % IV SOLN: INTRAVENOUS | Status: DC

## 2020-08-21 MED ORDER — GUAIFENESIN-DM 100-10 MG/5ML PO SYRP: 15.0000 mL | ORAL_SOLUTION | ORAL | Status: DC | PRN

## 2020-08-21 MED ORDER — PROPOFOL 500 MG/50ML IV EMUL
INTRAVENOUS | Status: DC | PRN
  Administered 2020-08-21: 75 ug/kg/min via INTRAVENOUS

## 2020-08-21 MED ORDER — OXYCODONE HCL 5 MG/5ML PO SOLN: 5.0000 mg | Freq: Once | ORAL | Status: DC | PRN

## 2020-08-21 MED ORDER — SUCCINYLCHOLINE CHLORIDE 200 MG/10ML IV SOSY
PREFILLED_SYRINGE | INTRAVENOUS | Status: AC
  Filled 2020-08-21: qty 10

## 2020-08-21 MED ORDER — ALUM & MAG HYDROXIDE-SIMETH 200-200-20 MG/5ML PO SUSP: 15.0000 mL | ORAL | Status: DC | PRN

## 2020-08-21 MED ORDER — ZINC SULFATE 220 (50 ZN) MG PO CAPS
220.0000 mg | ORAL_CAPSULE | Freq: Every day | ORAL | Status: DC
  Administered 2020-08-21 – 2020-08-22 (×2): 220 mg via ORAL
  Filled 2020-08-21 (×2): qty 1

## 2020-08-21 MED ORDER — FENTANYL CITRATE (PF) 100 MCG/2ML IJ SOLN
INTRAMUSCULAR | Status: AC
  Filled 2020-08-21: qty 2

## 2020-08-21 SURGICAL SUPPLY — 32 items
BLADE SAW SGTL MED 73X18.5 STR (BLADE) IMPLANT
BLADE SURG 21 STRL SS (BLADE) ×2 IMPLANT
BNDG COHESIVE 4X5 TAN STRL (GAUZE/BANDAGES/DRESSINGS) ×2 IMPLANT
BNDG GAUZE ELAST 4 BULKY (GAUZE/BANDAGES/DRESSINGS) ×3 IMPLANT
CANISTER PREVENA PLUS 150 (CANNISTER) ×1 IMPLANT
COVER SURGICAL LIGHT HANDLE (MISCELLANEOUS) ×4 IMPLANT
COVER WAND RF STERILE (DRAPES) ×2 IMPLANT
DRAPE DERMATAC (DRAPES) ×2 IMPLANT
DRAPE U-SHAPE 47X51 STRL (DRAPES) ×4 IMPLANT
DRESSING PEEL AND PLC PRVNA 13 (GAUZE/BANDAGES/DRESSINGS) IMPLANT
DRSG ADAPTIC 3X8 NADH LF (GAUZE/BANDAGES/DRESSINGS) ×2 IMPLANT
DRSG PAD ABDOMINAL 8X10 ST (GAUZE/BANDAGES/DRESSINGS) ×3 IMPLANT
DRSG PEEL AND PLACE PREVENA 13 (GAUZE/BANDAGES/DRESSINGS) ×2
DURAPREP 26ML APPLICATOR (WOUND CARE) ×2 IMPLANT
ELECT REM PT RETURN 9FT ADLT (ELECTROSURGICAL) ×2
ELECTRODE REM PT RTRN 9FT ADLT (ELECTROSURGICAL) ×1 IMPLANT
GAUZE SPONGE 4X4 12PLY STRL (GAUZE/BANDAGES/DRESSINGS) ×2 IMPLANT
GLOVE BIOGEL PI IND STRL 9 (GLOVE) ×1 IMPLANT
GLOVE BIOGEL PI INDICATOR 9 (GLOVE) ×1
GLOVE SURG ORTHO 9.0 STRL STRW (GLOVE) ×2 IMPLANT
GOWN STRL REUS W/ TWL XL LVL3 (GOWN DISPOSABLE) ×2 IMPLANT
GOWN STRL REUS W/TWL XL LVL3 (GOWN DISPOSABLE) ×2
KIT BASIN OR (CUSTOM PROCEDURE TRAY) ×2 IMPLANT
KIT TURNOVER KIT B (KITS) ×2 IMPLANT
NS IRRIG 1000ML POUR BTL (IV SOLUTION) ×2 IMPLANT
PACK ORTHO EXTREMITY (CUSTOM PROCEDURE TRAY) ×2 IMPLANT
PAD ARMBOARD 7.5X6 YLW CONV (MISCELLANEOUS) ×4 IMPLANT
STOCKINETTE IMPERVIOUS LG (DRAPES) IMPLANT
SUT ETHILON 2 0 PSLX (SUTURE) ×3 IMPLANT
TOWEL GREEN STERILE (TOWEL DISPOSABLE) ×2 IMPLANT
TUBE CONNECTING 12X1/4 (SUCTIONS) ×2 IMPLANT
YANKAUER SUCT BULB TIP NO VENT (SUCTIONS) ×2 IMPLANT

## 2020-08-21 NOTE — Progress Notes (Signed)
Report called to OR  

## 2020-08-21 NOTE — Progress Notes (Signed)
PROGRESS NOTE  Christian [name]  LNL:[id number] DOB: [birth year] DOA: 08/18/2020 PCP: Tonia Ghent, MD   Brief Narrative: Christian [name] is a 80 y.o. male with a history of IDT2DM with neuropathy, HTN, HLD who presented to the ED 5/8 with worsening right foot infection found to have osteomyelitis and abscess of right 5th metatarsal head on imaging. IV antibiotics were given and orthopedics took the patient for 5th ray amputation on 08/21/2020 with wound vac placement.  Assessment & Plan: Active Problems:   Cellulitis in diabetic foot (HCC)   Diabetic foot infection ([place])   Osteomyelitis of fifth toe of right foot (HCC)  Osteomyelitis and abscess of R metatarsal head ruled in with cellulitis: s/p 5th ray amputation 5/11 by Dr. Sharol Given.  - Wound vac management per orthopedics. NWB RLE and requests 1 week follow up. PT/OT ordered. - Continue vancomycin, cefepime. ?if intraoperative cultures sent. If infection felt to be localized, may be able to stop abx. - No blood cultures at admission, pt afebrile without leukocytosis.   IDT2DM:  - Continue levemir 35u daily, SSI and mealtime novolog 3u TIDWC.   Lactic acidosis:  - Hold metformin.  - Given IVF hydration  HTN - Continue norvasc, holding lisinopril for now.  Diabetic neuropathy: - Continue gabapentin  HLD:  - Continue statin  GERD:  - Continue PPI  DVT prophylaxis: Lovenox Code Status: Full Family Communication: None at bedside Disposition Plan:  Status is: Inpatient  Remains inpatient appropriate because:Ongoing active pain requiring inpatient pain management, Unsafe d/c plan and Inpatient level of care appropriate due to severity of illness   Dispo: The patient is from: Home              Anticipated d/c is to: TBD              Patient currently is not medically stable to d/c.   Difficult to place patient No  Consultants:   Orthopedics, Dr. Sharol Given  Procedures:  08/21/20 RIGHT FOOT 5TH RAY AMPUTATION Newt Minion, MD    Antimicrobials:  Vancomycin 5/8 >>  Zosyn 5/8 - 5/9  Cefepime 5/10 >>   Subjective: Pain controlled, no N/V/D or other complaints.   Objective: Vitals:   08/21/20 1210 08/21/20 1255 08/21/20 1315 08/21/20 1416  BP: (!) 142/74 (!) 150/75 (!) 154/77 (!) 143/75  Pulse: 63 60 60 64  Resp: 14 14 16 18   Temp:  (!) 97 F (36.1 C)  (!) 97.5 F (36.4 C)  TempSrc:    Oral  SpO2: 91% 98% 97% 96%    Intake/Output Summary (Last 24 hours) at 08/21/2020 1736 Last data filed at 08/21/2020 1646 Gross per 24 hour  Intake 1017.14 ml  Output 225 ml  Net 792.14 ml     Gen: 80 y.o. male in no distress  Pulm: Non-labored breathing room air. Clear to auscultation bilaterally.  CV: Regular rate and rhythm. No murmur, rub, or gallop. No JVD, no pedal edema. GI: Abdomen soft, non-tender, non-distended, with normoactive bowel sounds. No organomegaly or masses felt. Ext: Warm, no deformities. RLE with intact motor and sensory function, warm, dry. Skin: Right foot wound wrapped, c/d/i. with wound vac. Neuro: Alert and oriented. No focal neurological deficits. Psych: Judgement and insight appear normal. Mood & affect appropriate.   Data Reviewed: I have personally reviewed following labs and imaging studies  CBC: Recent Labs  Lab 08/18/20 1324 08/19/20 0402  WBC 9.7 7.2  NEUTROABS 6.8  --   HGB 12.6* 10.7*  HCT 39.4 33.6*  MCV 92.3 91.6  PLT 211 272   Basic Metabolic Panel: Recent Labs  Lab 08/18/20 1324 08/19/20 0402 08/20/20 0155  NA 139 139 139  K 4.3 4.5 5.0  CL 108 106 104  CO2 20* 26 27  GLUCOSE 140* 162* 304*  BUN 14 12 14   CREATININE 1.04 1.07 1.20  CALCIUM 8.9 8.4* 8.7*   GFR: Estimated Creatinine Clearance: 58 mL/min (by C-G formula based on SCr of 1.2 mg/dL). Liver Function Tests: Recent Labs  Lab 08/18/20 1324  AST 23  ALT 18  ALKPHOS 55  BILITOT 0.9  PROT 6.7  ALBUMIN 3.3*   No results for input(s): LIPASE, AMYLASE in the last 168 hours. No results  for input(s): AMMONIA in the last 168 hours. Coagulation Profile: No results for input(s): INR, PROTIME in the last 168 hours. Cardiac Enzymes: No results for input(s): CKTOTAL, CKMB, CKMBINDEX, TROPONINI in the last 168 hours. BNP (last 3 results) No results for input(s): PROBNP in the last 8760 hours. HbA1C: No results for input(s): HGBA1C in the last 72 hours. CBG: Recent Labs  Lab 08/20/20 2052 08/21/20 0718 08/21/20 0932 08/21/20 1141 08/21/20 1520  GLUCAP 222* 205* 180* 163* 248*   Lipid Profile: No results for input(s): CHOL, HDL, LDLCALC, TRIG, CHOLHDL, LDLDIRECT in the last 72 hours. Thyroid Function Tests: No results for input(s): TSH, T4TOTAL, FREET4, T3FREE, THYROIDAB in the last 72 hours. Anemia Panel: No results for input(s): VITAMINB12, FOLATE, FERRITIN, TIBC, IRON, RETICCTPCT in the last 72 hours. Urine analysis:    Component Value Date/Time   COLORURINE YELLOW 05/17/2016 0650   APPEARANCEUR CLOUDY (A) 05/17/2016 0650   LABSPEC 1.014 05/17/2016 0650   PHURINE 5.0 05/17/2016 0650   GLUCOSEU NEGATIVE 05/17/2016 0650   HGBUR NEGATIVE 05/17/2016 0650   BILIRUBINUR NEGATIVE 05/17/2016 0650   KETONESUR NEGATIVE 05/17/2016 0650   PROTEINUR NEGATIVE 05/17/2016 0650   NITRITE NEGATIVE 05/17/2016 0650   LEUKOCYTESUR LARGE (A) 05/17/2016 0650   Recent Results (from the past 240 hour(s))  Resp Panel by RT-PCR (Flu A&B, Covid) Nasopharyngeal Swab     Status: None   Collection Time: 08/18/20  5:47 PM   Specimen: Nasopharyngeal Swab; Nasopharyngeal(NP) swabs in vial transport medium  Result Value Ref Range Status   SARS Coronavirus 2 by RT PCR NEGATIVE NEGATIVE Final    Comment: (NOTE) SARS-CoV-2 target nucleic acids are NOT DETECTED.  The SARS-CoV-2 RNA is generally detectable in upper respiratory specimens during the acute phase of infection. The lowest concentration of SARS-CoV-2 viral copies this assay can detect is 138 copies/mL. A negative result does not  preclude SARS-Cov-2 infection and should not be used as the sole basis for treatment or other patient management decisions. A negative result may occur with  improper specimen collection/handling, submission of specimen other than nasopharyngeal swab, presence of viral mutation(s) within the areas targeted by this assay, and inadequate number of viral copies(<138 copies/mL). A negative result must be combined with clinical observations, patient history, and epidemiological information. The expected result is Negative.  Fact Sheet for Patients:  EntrepreneurPulse.com.au  Fact Sheet for Healthcare Providers:  IncredibleEmployment.be  This test is no t yet approved or cleared by the [place] FDA and  has been authorized for detection and/or diagnosis of SARS-CoV-2 by FDA under an Emergency Use Authorization (EUA). This EUA will remain  in effect (meaning this test can be used) for the duration of the COVID-19 declaration under Section 564(b)(1) of the Act, 21 U.S.C.section 360bbb-3(b)(1), unless  the authorization is terminated  or revoked sooner.       Influenza A by PCR NEGATIVE NEGATIVE Final   Influenza B by PCR NEGATIVE NEGATIVE Final    Comment: (NOTE) The Xpert Xpress SARS-CoV-2/FLU/RSV plus assay is intended as an aid in the diagnosis of influenza from Nasopharyngeal swab specimens and should not be used as a sole basis for treatment. Nasal washings and aspirates are unacceptable for Xpert Xpress SARS-CoV-2/FLU/RSV testing.  Fact Sheet for Patients: EntrepreneurPulse.com.au  Fact Sheet for Healthcare Providers: IncredibleEmployment.be  This test is not yet approved or cleared by the [place] FDA and has been authorized for detection and/or diagnosis of SARS-CoV-2 by FDA under an Emergency Use Authorization (EUA). This EUA will remain in effect (meaning this test can be used) for the  duration of the COVID-19 declaration under Section 564(b)(1) of the Act, 21 U.S.C. section 360bbb-3(b)(1), unless the authorization is terminated or revoked.  Performed at [facility], [place] [address] [place], [place] [address]       Radiology Studies: MR FOOT RIGHT WO CONTRAST  Result Date: 08/19/2020 CLINICAL DATA:  Diabetic foot ulcer. EXAM: MRI OF THE RIGHT FOREFOOT WITHOUT CONTRAST TECHNIQUE: Multiplanar, multisequence MR imaging of the right foot was performed. No intravenous contrast was administered. COMPARISON:  Radiographs 08/18/2020 FINDINGS: There is an open wound along the lateral aspect of the forefoot located at the level of the fifth MTP joint. There is a complex fluid collection wrapping around the region of the metacarpal head consistent with a soft tissue abscess. There is also a joint effusion and abnormal signal intensity in the fifth metatarsal head consistent with septic arthritis and osteomyelitis. Diffuse cellulitis and myositis without findings for pyomyositis. Moderate to advanced degenerative changes at the first MTP joint. I do not see any other definite sites of osteomyelitis or septic arthritis. IMPRESSION: 1. Open wound along the lateral aspect of the forefoot at the level of the fifth MTP joint. Underlying horseshoe shaped subcutaneous abscess. 2. Septic arthritis and osteomyelitis involving the fifth MTP joint. 3. Diffuse cellulitis and myositis without findings for pyomyositis. Electronically Signed   By: Marijo Sanes M.D.   On: 08/19/2020 18:58    Scheduled Meds: . amLODipine  2.5 mg Oral Daily  . vitamin C  1,000 mg Oral Daily  . atorvastatin  80 mg Oral QHS  . [START ON 08/22/2020] docusate sodium  100 mg Oral Daily  . [START ON 08/22/2020] enoxaparin (LOVENOX) injection  40 mg Subcutaneous Q24H  . feeding supplement (GLUCERNA SHAKE)  237 mL Oral TID BM  . fentaNYL      . gabapentin  200 mg Oral BID  . insulin aspart  0-20 Units Subcutaneous TID WC   . insulin aspart  3 Units Subcutaneous TID WC  . insulin detemir  20 Units Subcutaneous Q2200  . lactobacillus  1 g Oral TID WC  . ondansetron      . pantoprazole  40 mg Oral BID  . zinc sulfate  220 mg Oral Daily   Continuous Infusions: . sodium chloride 75 mL/hr at 08/21/20 1514  . ceFEPime (MAXIPIME) IV 2 g (08/20/20 2105)  . vancomycin 1,000 mg (08/20/20 2106)     LOS: 3 days   Time spent: 25 minutes.  Patrecia Pour, MD [facility] [email or web address] 08/21/2020, 5:36 PM

## 2020-08-21 NOTE — Progress Notes (Signed)
O.R. personnel called this nurse concerning consent. Made them aware per the previous conversation with charge nurse that charge nurse stated patient would be consented downstairs and any prior initiated consents could be shredded.

## 2020-08-21 NOTE — Progress Notes (Signed)
CSW continues to follow for discharge needs. Lurline Idol, MSW, LCSW 5/11/20228:34 AM

## 2020-08-21 NOTE — Anesthesia Preprocedure Evaluation (Signed)
Anesthesia Evaluation  Patient identified by MRN, date of birth, ID band Patient awake    Reviewed: Allergy & Precautions, NPO status , Patient's Chart, lab work & pertinent test results  Airway Mallampati: II  TM Distance: >3 FB Neck ROM: Full    Dental  (+) Teeth Intact, Dental Advisory Given   Pulmonary    breath sounds clear to auscultation       Cardiovascular hypertension,  Rhythm:Regular Rate:Normal     Neuro/Psych    GI/Hepatic   Endo/Other  diabetes  Renal/GU      Musculoskeletal   Abdominal   Peds  Hematology   Anesthesia Other Findings   Reproductive/Obstetrics                             Anesthesia Physical Anesthesia Plan  ASA: III  Anesthesia Plan: MAC   Post-op Pain Management:  Regional for Post-op pain   Induction:   PONV Risk Score and Plan: Ondansetron and Propofol infusion  Airway Management Planned: Natural Airway and Simple Face Mask  Additional Equipment:   Intra-op Plan:   Post-operative Plan:   Informed Consent: I have reviewed the patients History and Physical, chart, labs and discussed the procedure including the risks, benefits and alternatives for the proposed anesthesia with the patient or authorized representative who has indicated his/her understanding and acceptance.       Plan Discussed with: CRNA and Anesthesiologist  Anesthesia Plan Comments: (Plan MAC with ankle block)        Anesthesia Quick Evaluation

## 2020-08-21 NOTE — Anesthesia Procedure Notes (Signed)
Procedure Name: MAC Date/Time: 08/21/2020 11:03 AM Performed by: Trinna Post., CRNA Pre-anesthesia Checklist: Patient identified, Emergency Drugs available, Suction available, Patient being monitored and Timeout performed Patient Re-evaluated:Patient Re-evaluated prior to induction Oxygen Delivery Method: Simple face mask Preoxygenation: Pre-oxygenation with 100% oxygen Induction Type: IV induction Placement Confirmation: positive ETCO2

## 2020-08-21 NOTE — Op Note (Signed)
08/21/2020  11:44 AM  PATIENT:  [name]    PRE-OPERATIVE DIAGNOSIS:  Right 5th Toe Abscess and Osteomyelitis  POST-OPERATIVE DIAGNOSIS:  Same  PROCEDURE:  RIGHT FOOT 5TH RAY AMPUTATION Local tissue rearrangement for wound closure for by 10 cm. Application of Prevena wound VAC 13 cm.  SURGEON:  Newt Minion, MD  PHYSICIAN ASSISTANT:None ANESTHESIA:   General  PREOPERATIVE INDICATIONS:  ZELMA MAZARIEGO is a  80 y.o. male with a diagnosis of Right 5th Toe Abscess and Osteomyelitis who failed conservative measures and elected for surgical management.    The risks benefits and alternatives were discussed with the patient preoperatively including but not limited to the risks of infection, bleeding, nerve injury, cardiopulmonary complications, the need for revision surgery, among others, and the patient was willing to proceed.  OPERATIVE IMPLANTS: Praveena wound VAC 13 cm.  @ENCIMAGES @  OPERATIVE FINDINGS: No abscess extending along the dorsal or plantar aspect of the foot abscess was localized to the fifth toe MTP joint.  Margins were clear.  OPERATIVE PROCEDURE: Patient was brought the operating room after undergoing a regional anesthetic.  After adequate levels anesthesia were obtained abscess tracking up the dorsum or plantar aspect of the foot.  There was no evidence of infection at the level of amputation.  The margins were clear.  The wound was irrigated with normal saline.  Local tissue rearrangement was used to close the wound 4 x 10 cm.  A 13 cm Prevena wound VAC was applied this was outlined with derma tack this had a good suction fit this was overwrapped with Covan patient was taken the PACU in stable condition.  Right lower extremity was prepped using DuraPrep draped into a sterile field a timeout was called.  A racquet incision was made around the ulcerative tissue in the first ray.  This left a wound that was 4 x 10 cm.  The ulcer to the abscess on the dorsal and plantar  aspect of the foot were excised in 1 block of tissue.  The metatarsal was resected through the base.  Electrocautery was used hemostasis.  Examination over the dorsal and plantar aspect the foot showed no   DISCHARGE PLANNING:  Antibiotic duration: Continue antibiotics for 24 hours  Weightbearing: Nonweightbearing on the right  Pain medication: Opioid pathway  Dressing care/ Wound VAC: Continue wound VAC for 1 week  Ambulatory devices: Walker  Discharge to: Discharge planning based on recommendations of therapy.  Follow-up: In the office 1 week post operative.

## 2020-08-21 NOTE — Anesthesia Postprocedure Evaluation (Signed)
Anesthesia Post Note  Patient: Christian [name]  Procedure(s) Performed: RIGHT FOOT 5TH RAY AMPUTATION (Right )     Patient location during evaluation: PACU Anesthesia Type: Regional Level of consciousness: awake and alert Pain management: pain level controlled Vital Signs Assessment: post-procedure vital signs reviewed and stable Respiratory status: spontaneous breathing, nonlabored ventilation, respiratory function stable and patient connected to nasal cannula oxygen Cardiovascular status: stable and blood pressure returned to baseline Postop Assessment: no apparent nausea or vomiting Anesthetic complications: no   No complications documented.  Last Vitals:  Vitals:   08/21/20 1315 08/21/20 1416  BP: (!) 154/77 (!) 143/75  Pulse: 60 64  Resp: 16 18  Temp:  (!) 36.4 C  SpO2: 97% 96%    Last Pain:  Vitals:   08/21/20 1416  TempSrc: Oral  PainSc:                  Halena Mohar COKER

## 2020-08-21 NOTE — Anesthesia Procedure Notes (Signed)
Anesthesia Regional Block: Ankle block   Pre-Anesthetic Checklist: ,, timeout performed, Correct Patient, Correct Site, Correct Laterality, Correct Procedure, Correct Position, site marked, Risks and benefits discussed, Surgical consent,  Pre-op evaluation,  At surgeon's request  Laterality: Right  Prep: chloraprep       Needles:  Injection technique: Single-shot  Needle Type: Other      Needle Gauge: 22     Additional Needles:   Narrative:  Start time: 08/21/2020 10:40 AM End time: 08/21/2020 10:45 AM Injection made incrementally with aspirations every 5 mL.  Performed by: Personally  Anesthesiologist: Roberts Gaudy, MD  Additional Notes: 30 cc 2.0% lidocaine plain injected easily

## 2020-08-21 NOTE — Care Management Important Message (Signed)
Important Message  Patient Details  Name: Christian [name] MRN: [id number] Date of Birth: 11-23-1940   Medicare Important Message Given:  Yes     Orbie Pyo 08/21/2020, 2:04 PM

## 2020-08-21 NOTE — Transfer of Care (Signed)
Immediate Anesthesia Transfer of Care Note  Patient: Christian [name]  Procedure(s) Performed: RIGHT FOOT 5TH RAY AMPUTATION (Right )  Patient Location: PACU  Anesthesia Type:MAC and Regional  Level of Consciousness: awake, alert , oriented and drowsy  Airway & Oxygen Therapy: Patient Spontanous Breathing and Patient connected to face mask oxygen  Post-op Assessment: Report given to RN and Post -op Vital signs reviewed and stable  Post vital signs: Reviewed and stable  Last Vitals:  Vitals Value Taken Time  BP 132/70 08/21/20 1139  Temp    Pulse 75 08/21/20 1141  Resp 15 08/21/20 1141  SpO2 97 % 08/21/20 1141  Vitals shown include unvalidated device data.  Last Pain:  Vitals:   08/21/20 0757  TempSrc: Oral  PainSc: 0-No pain      Patients Stated Pain Goal: 3 (02/12/10 1735)  Complications: No complications documented.

## 2020-08-21 NOTE — Interval H&P Note (Signed)
History and Physical Interval Note:  08/21/2020 7:18 AM  Christian [name]  has presented today for surgery, with the diagnosis of Right 5th Toe Abscess and Osteomyelitis.  The various methods of treatment have been discussed with the patient and family. After consideration of risks, benefits and other options for treatment, the patient has consented to  Procedure(s): RIGHT FOOT 5TH RAY AMPUTATION (Right) as a surgical intervention.  The patient's history has been reviewed, patient examined, no change in status, stable for surgery.  I have reviewed the patient's chart and labs.  Questions were answered to the patient's satisfaction.     Newt Minion

## 2020-08-22 ENCOUNTER — Encounter (HOSPITAL_COMMUNITY): Payer: Self-pay | Admitting: Orthopedic Surgery

## 2020-08-22 ENCOUNTER — Ambulatory Visit: Payer: Medicare Other

## 2020-08-22 LAB — CBC
HCT: 36.6 % — ABNORMAL LOW (ref 39.0–52.0)
Hemoglobin: 11.8 g/dL — ABNORMAL LOW (ref 13.0–17.0)
MCH: 29.2 pg (ref 26.0–34.0)
MCHC: 32.2 g/dL (ref 30.0–36.0)
MCV: 90.6 fL (ref 80.0–100.0)
Platelets: 232 10*3/uL (ref 150–400)
RBC: 4.04 MIL/uL — ABNORMAL LOW (ref 4.22–5.81)
RDW: 13.4 % (ref 11.5–15.5)
WBC: 8.7 10*3/uL (ref 4.0–10.5)
nRBC: 0 % (ref 0.0–0.2)

## 2020-08-22 LAB — GLUCOSE, CAPILLARY
Glucose-Capillary: 259 mg/dL — ABNORMAL HIGH (ref 70–99)
Glucose-Capillary: 310 mg/dL — ABNORMAL HIGH (ref 70–99)
Glucose-Capillary: 256 mg/dL — ABNORMAL HIGH (ref 70–99)

## 2020-08-22 LAB — BASIC METABOLIC PANEL
Anion gap: 11 (ref 5–15)
BUN: 23 mg/dL (ref 8–23)
CO2: 22 mmol/L (ref 22–32)
Calcium: 8.8 mg/dL — ABNORMAL LOW (ref 8.9–10.3)
Chloride: 101 mmol/L (ref 98–111)
Creatinine, Ser: 1.15 mg/dL (ref 0.61–1.24)
GFR, Estimated: >60 mL/min (ref >60)
Glucose, Bld: 347 mg/dL — ABNORMAL HIGH (ref 70–99)
Potassium: 5 mmol/L (ref 3.5–5.1)
Sodium: 134 mmol/L — ABNORMAL LOW (ref 135–145)

## 2020-08-22 MED ORDER — POLYETHYLENE GLYCOL 3350 17 G PO PACK: 17.0000 g | PACK | Freq: Every day | ORAL | 0 refills | Status: DC | PRN

## 2020-08-22 MED ORDER — OXYCODONE HCL 5 MG PO TABS: 5.0000 mg | ORAL_TABLET | ORAL | 0 refills | Status: AC | PRN

## 2020-08-22 NOTE — Progress Notes (Addendum)
Occupational Therapy Evaluation Patient Details Name: Christian Christian [name] Christian [name] MRN: [id number] DOB: May 06, 1940 Today's Date: 08/22/2020    History of Present Illness Christian Christian [name] Christian [name] is Christian Christian [name] 80 y.o. male who presents with purulent abscess fifth metatarsal head right foot.  5/11  RIGHT FOOT 5TH RAY Christian [name]. Patient has Christian Christian [name] history of uncontrolled type 2 diabetes with hypertension who has undergone several months of conservative care for the right foot.  Patient initially went to urgent care was started on antibiotics and has been followed by podiatry.  Patient states that he has had recurrence of the abscess and purulent drainage.   Clinical Impression   Christian Christian [name] Christian [name], he reports little to no pain at the start of the session and pleasant for therapy. 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 Christian Christian [name] Christian [name], and mobility with rw and min guard for safety. Christian Christian [name] Christian [name], and is recommended to d/c home with supervision for all ADLs and mobility initially.     Follow Up Recommendations  No OT follow up    Equipment Recommendations  Tub/shower seat       Precautions / Restrictions Precautions Precautions: Fall Precaution Comments: wound vac Restrictions Weight Bearing Restrictions: Yes RLE Weight Bearing: Non weight bearing      Mobility Bed Mobility Overal bed mobility: Modified Independent             General bed mobility comments: use fo bed rails    Transfers Overall transfer level: Needs assistance Equipment used: Rolling walker (2 wheeled) Transfers: Sit to/from Stand Sit to Stand: Min guard              Balance Overall balance assessment: Needs assistance Sitting-balance support: No upper extremity  supported Sitting balance-Leahy Scale: Good     Standing balance support: Bilateral upper extremity supported Standing balance-Leahy Scale: Poor                             ADL either performed or assessed with clinical judgement   ADL Overall ADL's : Needs assistance/impaired Eating/Feeding: Independent;Sitting   Grooming: Wash/dry face;Wash/dry hands;Oral care;Applying deodorant;Brushing hair;Set up;Sitting   Upper Body Bathing: Set up;Sitting   Lower Body Bathing: Minimal assistance;Sit to/from stand   Upper Body Dressing : Set up;Cueing for UE precautions;Sitting   Lower Body Dressing: Minimal assistance;Sit to/from stand   Toilet Transfer: Min guard;RW;Comfort height toilet;Grab bars   Toileting- Water quality scientist and Hygiene: Min guard;Sit to/from stand       Functional mobility during ADLs: Min guard;Rolling walker General ADL Comments: vc for safety                  Pertinent Vitals/Pain Pain Assessment: Faces Pain Location: R foot Pain Descriptors / Indicators: Discomfort;Sore Pain Intervention(s): Limited activity within patient's tolerance;Monitored during session     Hand Dominance Right   Extremity/Trunk Assessment Upper Extremity Assessment Upper Extremity Assessment: Overall WFL for tasks assessed   Lower Extremity Assessment Lower Extremity Assessment: Defer to PT evaluation   Cervical / Trunk Assessment Cervical / Trunk Assessment: Normal   Communication Communication Communication: No difficulties   Cognition Arousal/Alertness: Awake/alert Behavior During Therapy: WFL for tasks assessed/performed Overall  Cognitive Status: Within Functional Limits for tasks assessed              General Comments  R foot ace wrapped, wond vac in tact      Christian Christian [name] Christian [name] expects to be discharged to:: Private residence Living Arrangements: Spouse/significant other Available Help at Discharge: Family Type of Home:  House Home Access: Stairs to enter Technical brewer of Steps: 4 Entrance Stairs-Rails: Can reach both [place]: One level     Bathroom Shower/Tub: Walk-in shower;Door   [employer] Toilet: Handicapped height Bathroom Accessibility: Yes How Accessible: Accessible via walker Home Equipment: [place] - 2 wheels (wife has DME this is in use)   Additional Comments: pt is primary caregiver to his wife who had Christian Christian [name] recent MIA      Prior Functioning/Environment Level of Independence: Independent                 OT Problem List: Impaired balance (sitting and/or standing);Decreased activity tolerance;Decreased safety awareness;Decreased knowledge of use of DME or AE;Decreased knowledge of precautions;Pain         OT Goals(Current goals can be found in the care plan section) Acute Rehab OT Goals Patient Stated Goal: to go home today   AM-PAC OT "6 Clicks" Daily Activity     Outcome Measure Help from another person eating meals?: None Help from another person taking care of personal grooming?: Christian Christian [name] Little Help from another person toileting, which includes using toliet, bedpan, or urinal?: Christian Christian [name] Little Help from another person bathing (including washing, rinsing, drying)?: Christian Christian [name] Little Help from another person to put on and taking off regular upper body clothing?: Christian Christian [name] Little Help from another person to put on and taking off regular lower body clothing?: Christian Christian [name] Little 6 Click Score: 19   End of Session Nurse Communication: Mobility status;Weight bearing status  Activity Tolerance: Patient tolerated treatment well Patient left: in chair;with call bell/phone within reach  OT Visit Diagnosis: Other abnormalities of gait and mobility (R26.89);Pain Pain - Right/Left: Right Pain - part of body: Ankle and joints of foot                Time: 1610-9604 OT Time Calculation (min): 20 min Charges:  OT General Charges $OT Visit: 1 Visit OT Evaluation $OT Eval Low Complexity: 1 Low   Christian Mally Christian Christian [name]  Bearl Christian [name] 08/22/2020, 10:26 AM

## 2020-08-22 NOTE — Progress Notes (Signed)
Inpatient Rehab Admissions Coordinator:   Consult received.  Note OT recommending no f/u.  Will sign off for CIR at this time.   Shann Medal, PT, DPT Admissions Coordinator [phone] 08/22/20  11:44 AM

## 2020-08-22 NOTE — Discharge Summary (Signed)
Physician Discharge Summary  Christian [name] ZOX:[id number] DOB: 1940/04/22 DOA: 08/18/2020  PCP: Tonia Ghent, MD  Admit date: 08/18/2020 Discharge date: 08/22/2020  Admitted From: Home Disposition: Home   Recommendations for Outpatient Follow-up:  1. Follow up with PCP in 1-2 weeks 2. Follow up with orthopedics, Dr. Sharol Given within 1 week for wound vac management and wound recheck.   Home Health: PT, RN Equipment/Devices: Praveena wound vac Discharge Condition: Stable CODE STATUS: Full Diet recommendation: Carb-modified  Brief/Interim Summary: Christian [name] is a 80 y.o. male with a history of IDT2DM with neuropathy, HTN, HLD who presented to the ED 5/8 with worsening right foot infection found to have osteomyelitis and abscess of right 5th metatarsal head on imaging. IV antibiotics were given and orthopedics took the patient for 5th ray amputation on 08/21/2020 with wound vac placement. Postoperative course has been uncomplicated. He remains afebrile without leukocytosis, so antibiotics are discontinued per orthopedics recommendations 24 hours postoperatively. He is felt safe to be discharged home at his current level of function/mobility and will follow up within 1 week for wound vac/wound care management.  Discharge Diagnoses:  Active Problems:   Cellulitis in diabetic foot (HCC)   Diabetic foot infection ([place])   Osteomyelitis of fifth toe of right foot (HCC)  Osteomyelitis and abscess of R metatarsal head ruled in with cellulitis: s/p 5th ray amputation 5/11 by Dr. Sharol Given.  - Wound vac management per orthopedics. NWB RLE and requests 1 week follow up.  - OT recommends no follow up. - Continude vancomycin, cefepime x24 hours postoperatively per ortho recommendations. Remains afebrile without leukocytosis. Definitive management was surgery.  - Oxycodone prescribed for severe pain, tylenol recommended otherwise. PDMP reviewed and is negative.   IDT2DM:  - Continue home medications, though  may need uptitration. Defer to PCP.   Lactic acidosis:  - Improved.  HTN - Continue norvasc, lisinopril  Diabetic neuropathy: - Continue gabapentin  HLD:  - Continue statin  GERD:  - Continue PPI  Discharge Instructions Discharge Instructions    Diet Carb Modified   Complete by: As directed    Discharge instructions   Complete by: As directed    You were treated with antibiotics and ultimately amputation/debridement of the wound and infection of the bone on the right foot. Orthopedics has cleared you for discharge with recommendations to continue the wound vac, call their office if it alarms, and continue with therapy at home. No further antibiotics are required. If you experience fever, worsening pain, or inability to care for the wound, seek medical attention right away. Otherwise, follow up with orthopedics in 1 week and also schedule an appointment with your PCP to continue management of your diabetes. You may need to increase doses of medications.   Leave dressing on - Keep it clean, dry, and intact until clinic visit   Complete by: As directed    Negative Pressure Wound Therapy - Incisional   Complete by: As directed    Show patient how to attach vac. Call office if vac alarms     Allergies as of 08/22/2020   No Known Allergies     Medication List    TAKE these medications   amLODipine 2.5 MG tablet Commonly known as: NORVASC Take 1 tablet (2.5 mg total) by mouth daily.   atorvastatin 80 MG tablet Commonly known as: LIPITOR Take 1 tablet (80 mg total) by mouth at bedtime.   gabapentin 300 MG capsule Commonly known as: NEURONTIN Take one capsule by mouth  every morning and 2 capsules by mouth at bedtime. What changed:   how much to take  how to take this  when to take this  additional instructions   glipiZIDE 5 MG tablet Commonly known as: GLUCOTROL Take 2 tablets by mouth in the morning and 1 tablet by mouth at night.   Levemir FlexTouch 100  UNIT/ML FlexPen Generic drug: insulin detemir INJECT UP TO 20-36 UNITS SUBCUTANEOUSLY ONCE DAILY What changed:   how much to take  how to take this  when to take this  additional instructions   lisinopril 30 MG tablet Commonly known as: ZESTRIL Take 1 tablet (30 mg total) by mouth daily.   metFORMIN 1000 MG tablet Commonly known as: GLUCOPHAGE Take 1 tablet (1,000 mg total) by mouth 2 (two) times daily with a meal.   OneTouch Ultra test strip Generic drug: glucose blood USE 1 STRIP TO CHECK GLUCOSE ONCE DAILY   oxyCODONE 5 MG immediate release tablet Commonly known as: Oxy IR/ROXICODONE Take 1 tablet (5 mg total) by mouth every 4 (four) hours as needed for up to 7 days for severe pain.   pantoprazole 40 MG tablet Commonly known as: PROTONIX Take 1 tablet by mouth twice daily   polyethylene glycol 17 g packet Commonly known as: MIRALAX / GLYCOLAX Take 17 g by mouth daily as needed for mild constipation.   RELION PEN NEEDLE 31G/8MM 31G X 8 MM Misc Generic drug: Insulin Pen Needle USE TO INJECT LEVEMIR DAILY   silver sulfADIAZINE 1 % cream Commonly known as: Silvadene Apply pea-sized amount to wound daily. What changed:   how much to take  how to take this  when to take this            Discharge Care Instructions  (From admission, onward)         Start     Ordered   08/22/20 0000  Leave dressing on - Keep it clean, dry, and intact until clinic visit        08/22/20 0854          Follow-up Information    Suzan Slick, NP In 1 week.   Specialty: Orthopedic Surgery Contact information: [place] [address] [phone]        Tonia Ghent, MD. Schedule an appointment as soon as possible for a visit in 1 week(s).   Specialty: Family Medicine Contact information: [place] [address] [phone]              No Known Allergies  Consultations:  Orthopedics  Procedures/Studies: MR  FOOT RIGHT WO CONTRAST  Result Date: 08/19/2020 CLINICAL DATA:  Diabetic foot ulcer. EXAM: MRI OF THE RIGHT FOREFOOT WITHOUT CONTRAST TECHNIQUE: Multiplanar, multisequence MR imaging of the right foot was performed. No intravenous contrast was administered. COMPARISON:  Radiographs 08/18/2020 FINDINGS: There is an open wound along the lateral aspect of the forefoot located at the level of the fifth MTP joint. There is a complex fluid collection wrapping around the region of the metacarpal head consistent with a soft tissue abscess. There is also a joint effusion and abnormal signal intensity in the fifth metatarsal head consistent with septic arthritis and osteomyelitis. Diffuse cellulitis and myositis without findings for pyomyositis. Moderate to advanced degenerative changes at the first MTP joint. I do not see any other definite sites of osteomyelitis or septic arthritis. IMPRESSION: 1. Open wound along the lateral aspect of the forefoot at the level of the  fifth MTP joint. Underlying horseshoe shaped subcutaneous abscess. 2. Septic arthritis and osteomyelitis involving the fifth MTP joint. 3. Diffuse cellulitis and myositis without findings for pyomyositis. Electronically Signed   By: Marijo Sanes M.D.   On: 08/19/2020 18:58   DG Foot Complete Right  Result Date: 08/18/2020 CLINICAL DATA:  Infection in the right foot. EXAM: RIGHT FOOT COMPLETE - 3+ VIEW COMPARISON:  Foot radiographs dated 07/27/2020. FINDINGS: There is no evidence of fracture or dislocation. There is soft tissue swelling around the fifth metatarsal phalangeal joint. There is no underlying focal osseous demineralization to suggest osteomyelitis. Degenerative changes are seen in the first digit metatarsal phalangeal joint including hallux valgus deformity. There is diffuse osseous demineralization. Plantar and posterior calcaneal enthesophytes are noted. IMPRESSION: No focal osseous demineralization to suggest osteomyelitis. Electronically  Signed   By: Zerita Boers M.D.   On: 08/18/2020 16:00   DG Foot Complete Right  Result Date: 07/27/2020 CLINICAL DATA:  Diabetic foot ulcers and infection at the distal first and 5th metatarsals. EXAM: RIGHT FOOT COMPLETE - 3+ VIEW COMPARISON:  None. FINDINGS: Moderate hallux valgus deformity associated with large osteophytes of the distal first metatarsal. Soft tissue swelling associated small ulceration is identified adjacent to the 5th metatarsophalangeal joint. There is no acute fracture or subluxation. No cortical lucencies to indicate osteomyelitis. Large plantar and Achilles spurs involve the calcaneus. IMPRESSION: 1. No evidence for acute osseous abnormality. 2. Soft tissue swelling and ulceration adjacent to the 5th metatarsophalangeal joint. 3. Significant hallux valgus deformity with large osteophytes at the distal first metatarsal. Electronically Signed   By: Nolon Nations M.D.   On: 07/27/2020 14:10    08/21/20 RIGHT FOOT 5TH RAY AMPUTATION Christian Minion, MD     Subjective: Feels well, wants to go home. Pain easily controlled. Working with therapy. No fever.   Discharge Exam: Vitals:   08/21/20 2325 08/22/20 0748  BP: 121/75 (!) 150/77  Pulse: 66 62  Resp: 17 17  Temp: 97.9 F (36.6 C) 97.8 F (36.6 C)  SpO2: 97% 100%   General: Pt is alert, awake, not in acute distress Cardiovascular: RRR, S1/S2 +, no rubs, no gallops Respiratory: CTA bilaterally, no wheezing, no rhonchi Abdominal: Soft, NT, ND, bowel sounds + Extremities: RLE with ACE wrap c/d/i, neurovascularly intact   Labs: Basic Metabolic Panel: Recent Labs  Lab 08/18/20 1324 08/19/20 0402 08/20/20 0155 08/22/20 0118  NA 139 139 139 134*  K 4.3 4.5 5.0 5.0  CL 108 106 104 101  CO2 20* 26 27 22   GLUCOSE 140* 162* 304* 347*  BUN 14 12 14 23   CREATININE 1.04 1.07 1.20 1.15  CALCIUM 8.9 8.4* 8.7* 8.8*   Liver Function Tests: Recent Labs  Lab 08/18/20 1324  AST 23  ALT 18  ALKPHOS 55  BILITOT  0.9  PROT 6.7  ALBUMIN 3.3*   CBC: Recent Labs  Lab 08/18/20 1324 08/19/20 0402 08/22/20 0118  WBC 9.7 7.2 8.7  NEUTROABS 6.8  --   --   HGB 12.6* 10.7* 11.8*  HCT 39.4 33.6* 36.6*  MCV 92.3 91.6 90.6  PLT 211 185 232   CBG: Recent Labs  Lab 08/21/20 0932 08/21/20 1141 08/21/20 1520 08/21/20 2128 08/22/20 0642  GLUCAP 180* 163* 248* 389* 259*   Microbiology Recent Results (from the past 240 hour(s))  Resp Panel by RT-PCR (Flu A&B, Covid) Nasopharyngeal Swab     Status: None   Collection Time: 08/18/20  5:47 PM   Specimen: Nasopharyngeal Swab;  Nasopharyngeal(NP) swabs in vial transport medium  Result Value Ref Range Status   SARS Coronavirus 2 by RT PCR NEGATIVE NEGATIVE Final    Comment: (NOTE) SARS-CoV-2 target nucleic acids are NOT DETECTED.  The SARS-CoV-2 RNA is generally detectable in upper respiratory specimens during the acute phase of infection. The lowest concentration of SARS-CoV-2 viral copies this assay can detect is 138 copies/mL. A negative result does not preclude SARS-Cov-2 infection and should not be used as the sole basis for treatment or other patient management decisions. A negative result may occur with  improper specimen collection/handling, submission of specimen other than nasopharyngeal swab, presence of viral mutation(s) within the areas targeted by this assay, and inadequate number of viral copies(<138 copies/mL). A negative result must be combined with clinical observations, patient history, and epidemiological information. The expected result is Negative.  Fact Sheet for Patients:  EntrepreneurPulse.com.au  Fact Sheet for Healthcare Providers:  IncredibleEmployment.be  This test is no t yet approved or cleared by the [place] FDA and  has been authorized for detection and/or diagnosis of SARS-CoV-2 by FDA under an Emergency Use Authorization (EUA). This EUA will remain  in effect (meaning  this test can be used) for the duration of the COVID-19 declaration under Section 564(b)(1) of the Act, 21 U.S.C.section 360bbb-3(b)(1), unless the authorization is terminated  or revoked sooner.       Influenza A by PCR NEGATIVE NEGATIVE Final   Influenza B by PCR NEGATIVE NEGATIVE Final    Comment: (NOTE) The Xpert Xpress SARS-CoV-2/FLU/RSV plus assay is intended as an aid in the diagnosis of influenza from Nasopharyngeal swab specimens and should not be used as a sole basis for treatment. Nasal washings and aspirates are unacceptable for Xpert Xpress SARS-CoV-2/FLU/RSV testing.  Fact Sheet for Patients: EntrepreneurPulse.com.au  Fact Sheet for Healthcare Providers: IncredibleEmployment.be  This test is not yet approved or cleared by the [place] FDA and has been authorized for detection and/or diagnosis of SARS-CoV-2 by FDA under an Emergency Use Authorization (EUA). This EUA will remain in effect (meaning this test can be used) for the duration of the COVID-19 declaration under Section 564(b)(1) of the Act, 21 U.S.C. section 360bbb-3(b)(1), unless the authorization is terminated or revoked.  Performed at [facility], [place] [address], [place], [place] [address]     Time coordinating discharge: Approximately 40 minutes  Patrecia Pour, MD  [facility] 08/22/2020, 8:55 AM

## 2020-08-22 NOTE — Progress Notes (Signed)
Nsg Discharge Note  Admit Date:  08/18/2020 Discharge date: 08/22/2020   [name] to be D/C'd  per MD order.  Patient/caregiver able to verbalize understanding.  Discharge Medication: Allergies as of 08/22/2020   No Known Allergies     Medication List    TAKE these medications   amLODipine 2.5 MG tablet Commonly known as: NORVASC Take 1 tablet (2.5 mg total) by mouth daily.   atorvastatin 80 MG tablet Commonly known as: LIPITOR Take 1 tablet (80 mg total) by mouth at bedtime.   gabapentin 300 MG capsule Commonly known as: NEURONTIN Take one capsule by mouth every morning and 2 capsules by mouth at bedtime. What changed:   how much to take  how to take this  when to take this  additional instructions   glipiZIDE 5 MG tablet Commonly known as: GLUCOTROL Take 2 tablets by mouth in the morning and 1 tablet by mouth at night.   Levemir FlexTouch 100 UNIT/ML FlexPen Generic drug: insulin detemir INJECT UP TO 20-36 UNITS SUBCUTANEOUSLY ONCE DAILY What changed:   how much to take  how to take this  when to take this  additional instructions   lisinopril 30 MG tablet Commonly known as: ZESTRIL Take 1 tablet (30 mg total) by mouth daily.   metFORMIN 1000 MG tablet Commonly known as: GLUCOPHAGE Take 1 tablet (1,000 mg total) by mouth 2 (two) times daily with a meal.   OneTouch Ultra test strip Generic drug: glucose blood USE 1 STRIP TO CHECK GLUCOSE ONCE DAILY   oxyCODONE 5 MG immediate release tablet Commonly known as: Oxy IR/ROXICODONE Take 1 tablet (5 mg total) by mouth every 4 (four) hours as needed for up to 7 days for severe pain.   pantoprazole 40 MG tablet Commonly known as: PROTONIX Take 1 tablet by mouth twice daily   polyethylene glycol 17 g packet Commonly known as: MIRALAX / GLYCOLAX Take 17 g by mouth daily as needed for mild constipation.   RELION PEN NEEDLE 31G/8MM 31G X 8 MM Misc Generic drug: Insulin Pen Needle USE TO INJECT  LEVEMIR DAILY   silver sulfADIAZINE 1 % cream Commonly known as: Silvadene Apply pea-sized amount to wound daily. What changed:   how much to take  how to take this  when to take this            Durable Medical Equipment  (From admission, onward)         Start     Ordered   08/22/20 1518  For home use only DME 3 n 1  Once        08/22/20 1517   08/22/20 1517  For home use only DME Walker rolling  Once       Question Answer Comment  Walker: With 5 Inch Wheels   Patient needs a walker to treat with the following condition Acute hematogenous osteomyelitis of right foot ([place])      08/22/20 1517           Discharge Care Instructions  (From admission, onward)         Start     Ordered   08/22/20 0000  Leave dressing on - Keep it clean, dry, and intact until clinic visit        08/22/20 0854          Discharge Assessment: Vitals:   08/21/20 2325 08/22/20 0748  BP: 121/75 (!) 150/77  Pulse: 66 62  Resp: 17 17  Temp: 97.9  F (36.6 C) 97.8 F (36.6 C)  SpO2: 97% 100%   Skin clean, dry and intact without evidence of skin break down, no evidence of skin tears noted. IV catheter discontinued intact. Site without signs and symptoms of complications - no redness or edema noted at insertion site, patient denies c/o pain - only slight tenderness at site.  Dressing with slight pressure applied.  D/c Instructions-Education: Discharge instructions given to patient/family with verbalized understanding. D/c education completed with patient/family including follow up instructions, medication list, d/c activities limitations if indicated, with other d/c instructions as indicated by MD - patient able to verbalize understanding, all questions fully answered. Patient instructed to return to ED, call 911, or call MD for any changes in condition.  Patient escorted via [place], and D/C home via private auto.  Natasia Sanko, Jolene Schimke, RN 08/22/2020 6:25 PM

## 2020-08-22 NOTE — Evaluation (Signed)
Physical Therapy Evaluation Patient Details Name: Christian [name] MRN: [id number] DOB: 11-Aug-1940 Today's Date: 08/22/2020   History of Present Illness  80 yo male presents to ED on 5/8 with R lateral foot wound x2 months, + cellulitis and osteomyelitis. s/p R 5th ray amputation 5/11. PMH includes OA, DM, HTN, HLD, SI pinning and I&D sacral wound 2017, gout.  Clinical Impression   Pt presents with mild RLE pain, impaired standing balance, effortful gait/step navigation, and decreased activity tolerance vs baseline. Pt to benefit from acute PT to address deficits. Pt ambulated short distance with use of RW and supervision for safety, step navigation requiring significant truncal and balance assist. PT recommending pt use ramp and w/c for entry and exit to home, given pt difficulty with steps. Per pt, the ramp is "professional grade and sturdy". Pt is caregiver for his wife, PT recommending pt bring in outside help to care for his wife while he is recovering. PT to progress mobility as tolerated, and will continue to follow acutely.      Follow Up Recommendations Home health PT;Supervision for mobility/OOB    Equipment Recommendations  Rolling walker with 5" wheels    Recommendations for Other Services       Precautions / Restrictions Precautions Precautions: Fall Precaution Comments: wound vac Restrictions Weight Bearing Restrictions: Yes RLE Weight Bearing: Non weight bearing      Mobility  Bed Mobility               General bed mobility comments: up in chair    Transfers Overall transfer level: Needs assistance Equipment used: Rolling walker (2 wheeled) Transfers: Sit to/from Stand Sit to Stand: Min guard         General transfer comment: for safety, verbal cuing for hand placement when rising/lowering. sit<>stand x2 from recliner.  Ambulation/Gait Ambulation/Gait assistance: Supervision Gait Distance (Feet): 20 Feet Assistive device: Rolling walker (2  wheeled) Gait Pattern/deviations: Step-to pattern;Trunk flexed Gait velocity: decr   General Gait Details: supervision for safety, good maintenance of NWB RLE  Stairs Stairs: Yes Stairs assistance: Mod assist Stair Management: Two rails;Step to pattern;Forwards Number of Stairs: 2 General stair comments: Mod assist for truncal support during ascending hop, pt with good maintenance of NWB but PT recommending use of ramp at home given current assist level required to do steps  Wheelchair Mobility    Modified Rankin (Stroke Patients Only)       Balance Overall balance assessment: Needs assistance Sitting-balance support: No upper extremity supported Sitting balance-Leahy Scale: Good Sitting balance - Comments: able to sit edge of recliner without PT assist   Standing balance support: Bilateral upper extremity supported Standing balance-Leahy Scale: Poor Standing balance comment: reliant on RW support, given NWB status                             Pertinent Vitals/Pain Pain Assessment: Faces Faces Pain Scale: Hurts a little bit Pain Location: R foot Pain Descriptors / Indicators: Discomfort Pain Intervention(s): Limited activity within patient's tolerance;Monitored during session    Home Living Family/patient expects to be discharged to:: Private residence Living Arrangements: Spouse/significant other Available Help at Discharge: Family Type of Home: House Home Access: Stairs to enter Entrance Stairs-Rails: Can reach both Entrance Stairs-Number of Steps: 4 Home Layout: One level Home Equipment: Environmental consultant - 2 wheels;Wheelchair - manual (wife has DME this is in use) Additional Comments: pt is primary caregiver to his wife who had a recent  TIA    Prior Function Level of Independence: Independent               Hand Dominance   Dominant Hand: Right    Extremity/Trunk Assessment   Upper Extremity Assessment Upper Extremity Assessment: Defer to OT  evaluation    Lower Extremity Assessment Lower Extremity Assessment: Generalized weakness;RLE deficits/detail RLE Deficits / Details: post-op    Cervical / Trunk Assessment Cervical / Trunk Assessment: Normal  Communication   Communication: HOH  Cognition Arousal/Alertness: Awake/alert Behavior During Therapy: WFL for tasks assessed/performed Overall Cognitive Status: Within Functional Limits for tasks assessed                                        General Comments General comments (skin integrity, edema, etc.): R foot ace wrapped, wound vac intact pre- and post-gait    Exercises     Assessment/Plan    PT Assessment Patient needs continued PT services  PT Problem List Decreased strength;Decreased mobility;Decreased safety awareness;Decreased activity tolerance;Decreased balance;Decreased knowledge of use of DME;Pain;Decreased knowledge of precautions       PT Treatment Interventions DME instruction;Therapeutic activities;Gait training;Therapeutic exercise;Patient/family education;Balance training;Stair training;Functional mobility training;Neuromuscular re-education    PT Goals (Current goals can be found in the Care Plan section)  Acute Rehab PT Goals Patient Stated Goal: to go home today PT Goal Formulation: With patient Time For Goal Achievement: 09/05/20 Potential to Achieve Goals: Good    Frequency Min 3X/week   Barriers to discharge        Co-evaluation               AM-PAC PT "6 Clicks" Mobility  Outcome Measure Help needed turning from your back to your side while in a flat bed without using bedrails?: A Little Help needed moving from lying on your back to sitting on the side of a flat bed without using bedrails?: A Little Help needed moving to and from a bed to a chair (including a wheelchair)?: A Little Help needed standing up from a chair using your arms (e.g., wheelchair or bedside chair)?: A Little Help needed to walk in  hospital room?: A Little Help needed climbing 3-5 steps with a railing? : A Little 6 Click Score: 18    End of Session Equipment Utilized During Treatment: Gait belt Activity Tolerance: Patient tolerated treatment well Patient left: in chair;with call bell/phone within reach;Other (comment) (pt states he will press call button and wait for assist prior to mobility out of recliner to bed) Nurse Communication: Mobility status PT Visit Diagnosis: Other abnormalities of gait and mobility (R26.89);Difficulty in walking, not elsewhere classified (R26.2)    Time: 1202-1228 PT Time Calculation (min) (ACUTE ONLY): 26 min   Charges:   PT Evaluation $PT Eval Low Complexity: 1 Low PT Treatments $Gait Training: 8-22 mins        Stacie Glaze, PT DPT Acute Rehabilitation Services Pager [phone]  Office [phone]   [place] 08/22/2020, 1:33 PM

## 2020-08-22 NOTE — TOC Transition Note (Signed)
Transition of Care [facility]) - CM/SW Discharge Note   Patient Details  Name: Christian [name] MRN: [id number] Date of Birth: [birth year]  Transition [facility]) CM/SW Contact:  Milinda Antis, [place] Phone Number: 08/22/2020, 3:25 PM   Clinical Narrative:    Patient will DC to:  Home with [facility] Anticipated DC date: 08/22/2020   Per MD patient ready for DC home with home health.  The patient reports that he lives with his wife.  Patient's address, phone number, and PCP were verified and he did not verbalize any concerns for affording medications. The patient did not have a [place] agency preference and reported that his sister and brother in law are able to transport him home  CSW called several [place] agencies and [facility] agreed to accept the patient for [facility] and [place].  DME was set up through [facility].    CSW will sign off for now as social work intervention is no longer needed. Please consult [place] again if new needs arise.    Final next level of care: [place] Barriers to Discharge: Barriers Resolved   Patient Goals and CMS Choice Patient states their goals for this hospitalization and ongoing recovery are:: To get back to doing woodwork CMS Medicare.gov Compare Post Acute Care list provided to:: Patient Choice offered to / list presented to : Patient  Discharge Placement                       Discharge Plan and Services                DME Arranged: Gilford Rile rolling,3-N-1 DME Agency: [facility] Date DME Agency Contacted: 08/22/20 Time DME Agency Contacted: [id number] Representative spoke with at DME Agency: [place] Arranged: RN,PT [place] Agency: Other - See comment ([facility]) Date HH Agency Contacted: 08/22/20 Time [place]: [id number] Representative spoke with at [place]: [place] ([place]) Interventions     Readmission Risk Interventions No flowsheet data found.

## 2020-08-22 NOTE — Progress Notes (Signed)
Postop day 1 status post right fifth ray amputation.  He is comfortable laying in bed.   Wound VAC Prave[place] is functioning well approximately 20 cc of bloody drainage.  Patient from orthopedic standpoint could be discharged.  Will need 1 week follow-up in our office

## 2020-08-23 ENCOUNTER — Telehealth: Payer: Self-pay

## 2020-08-23 DIAGNOSIS — I10 Essential (primary) hypertension: Secondary | ICD-10-CM | POA: Diagnosis not present

## 2020-08-23 DIAGNOSIS — L03115 Cellulitis of right lower limb: Secondary | ICD-10-CM | POA: Diagnosis not present

## 2020-08-23 DIAGNOSIS — M86172 Other acute osteomyelitis, left ankle and foot: Secondary | ICD-10-CM | POA: Diagnosis not present

## 2020-08-23 DIAGNOSIS — E11621 Type 2 diabetes mellitus with foot ulcer: Secondary | ICD-10-CM | POA: Diagnosis not present

## 2020-08-23 NOTE — Telephone Encounter (Signed)
Transition Care Management Follow-up Telephone Call  Date of discharge and from where: 08/22/2020 Christian [name]  How have you been since you were released from the hospital? Doing pretty good  Any questions or concerns? No  Items Reviewed:  Did the pt receive and understand the discharge instructions provided? Yes   Medications obtained and verified? Yes   Other? No   Any new allergies since your discharge? No   Dietary orders reviewed? Yes  Do you have support at home? Yes   Home Care and Equipment/Supplies: Were home health services ordered? yes If so, what is the name of the agency?   Has the agency set up a time to come to the patient's home? yes Were any new equipment or medical supplies ordered?  Yes: wound vac What is the name of the medical supply agency?  Were you able to get the supplies/equipment? yes Do you have any questions related to the use of the equipment or supplies? No  Functional Questionnaire: (I = Independent and D = Dependent) ADLs: I  Bathing/Dressing- I  Meal Prep- D  Eating- I  Maintaining continence- I  Transferring/Ambulation- I, with walker  Managing Meds- I  Follow up appointments reviewed:   PCP Hospital f/u appt confirmed? Yes  Scheduled to see Dr. [name] on 08/30/2020 @ 2:00.  Are transportation arrangements needed? No   If their condition worsens, is the pt aware to call PCP or go to the Emergency Dept.? Yes  Was the patient provided with contact information for the PCP's office or ED? Yes  Was to pt encouraged to call back with questions or concerns? Yes

## 2020-08-23 NOTE — Telephone Encounter (Signed)
Patient called to update medication list. He was called by [facility] nurse and realized there were some medications on his list he is no longer taking. He got home from toe amputation last night, doing well.   --He does not recall last dose of gabapentin. No nerve pain. Will remove from med list.  --He does not recall last dose of pantoprazole. He denies heartburn. Denies history of ulcer or stomach bleeding. Will remove from med list.   Debbora Dus, PharmD Clinical Pharmacist [facility] [phone]

## 2020-08-23 NOTE — Telephone Encounter (Signed)
Noted. Thanks.

## 2020-08-26 ENCOUNTER — Ambulatory Visit (INDEPENDENT_AMBULATORY_CARE_PROVIDER_SITE_OTHER): Payer: Medicare Other | Admitting: Physician Assistant

## 2020-08-26 ENCOUNTER — Telehealth: Payer: Self-pay

## 2020-08-26 ENCOUNTER — Encounter: Payer: Self-pay | Admitting: Physician Assistant

## 2020-08-26 DIAGNOSIS — M869 Osteomyelitis, unspecified: Secondary | ICD-10-CM

## 2020-08-26 NOTE — Telephone Encounter (Signed)
You already talked to him ?

## 2020-08-26 NOTE — Telephone Encounter (Signed)
Patient called he stated the wound vac stopped working yesterday afternoon,patient also stated the nurse from [facility] called him and told him he is supposed to be seen today with Dr.Nitka? call [phone]

## 2020-08-26 NOTE — Telephone Encounter (Signed)
Patient aware he is not seeing Dr. Louanne Skye

## 2020-08-26 NOTE — Telephone Encounter (Signed)
No I haven't, but Christian [name] made him an appointment to see Bevely Palmer this afternoon.

## 2020-08-26 NOTE — Progress Notes (Signed)
Office Visit Note   Patient: Christian [name]           Date of Birth: Mar 28, 1941           MRN: [id number] Visit Date: 08/26/2020              Requested by: Tonia Ghent, MD [address] [place],  [place] [address] PCP: Tonia Ghent, MD  Chief Complaint  Patient presents with  . Right Foot - Wound Check      HPI: Patient is 5 days status post right fifth ray amputation.  His wound VAC stopped functioning and alarming no other complaints  Assessment & Plan: Visit Diagnoses: No diagnosis found.  Plan: Patient will begin daily Dial cleansing with application of a new dry dressing.  Follow-up in 1 week.  Follow-Up Instructions: No follow-ups on file.   Ortho Exam  Patient is alert, oriented, no adenopathy, well-dressed, normal affect, normal respiratory effort. Examination well apposed wound edges.  He does have some skin maceration and some bloody drainage.  Overall well controlled swelling.  No wound dehiscence.  Sutures are all in place.  No cellulitis or signs of infection  Imaging: No results found. No images are attached to the encounter.  Labs: Lab Results  Component Value Date   HGBA1C 9.1 (H) 08/08/2020   HGBA1C 9.5 (A) 04/16/2020   HGBA1C 9.8 (H) 01/04/2020   ESRSEDRATE 35 (H) 08/08/2020   LABURIC 5.0 08/19/2020   REPTSTATUS 05/20/2016 FINAL 05/17/2016   CULT >=100,000 COLONIES/mL ESCHERICHIA COLI (A) 05/17/2016   LABORGA ESCHERICHIA COLI (A) 05/17/2016     Lab Results  Component Value Date   ALBUMIN 3.3 (L) 08/18/2020   ALBUMIN 4.2 01/04/2020   ALBUMIN 4.3 03/01/2018    Lab Results  Component Value Date   MG 1.4 (L) 03/06/2016   No results found for: VD25OH  No results found for: PREALBUMIN CBC EXTENDED Latest Ref Rng & Units 08/22/2020 08/19/2020 08/18/2020  WBC 4.0 - 10.5 K/uL 8.7 7.2 9.7  RBC 4.22 - 5.81 MIL/uL 4.04(L) 3.67(L) 4.27  HGB 13.0 - 17.0 g/dL 11.8(L) 10.7(L) 12.6(L)  HCT 39.0 - 52.0 % 36.6(L) 33.6(L) 39.4  PLT 150 -  400 K/uL 232 185 211  NEUTROABS 1.7 - 7.7 K/uL - - 6.8  LYMPHSABS 0.7 - 4.0 K/uL - - 1.9     There is no height or weight on file to calculate BMI.  Orders:  No orders of the defined types were placed in this encounter.  No orders of the defined types were placed in this encounter.    Procedures: No procedures performed  Clinical Data: No additional findings.  ROS:  All other systems negative, except as noted in the HPI. Review of Systems  Objective: Vital Signs: There were no vitals taken for this visit.  Specialty Comments:  No specialty comments available.  PMFS History: Patient Active Problem List   Diagnosis Date Noted  . Osteomyelitis of fifth toe of right foot ([place])   . Cellulitis in diabetic foot ([place]) 08/18/2020  . Diabetic foot infection ([place]) 08/18/2020  . Diabetic foot ulcer ([place]) 08/11/2020  . Healthcare maintenance 01/10/2020  . Lower back pain 09/27/2019  . Thumb pain 06/25/2019  . Medicare annual wellness visit, subsequent 03/12/2018  . Advance care planning 03/12/2018  . Shoulder pain 02/03/2017  . Edema 11/02/2016  . Diabetes mellitus with microalbuminuria ([name]) 06/10/2016  . HLD (hyperlipidemia) 06/10/2016  . DM type 2 with diabetic peripheral neuropathy ([place])   .  HTN (hypertension)   . Accident caused by farm tractor 01/15/2016  . Colon polyps 06/24/2013   Past Medical History:  Diagnosis Date  . Arthritis   . Blood transfusion without reported diagnosis    ? in 2017 after crushing injury   . Cataract    right eye removed   . Crushing injury of pelvic region   . Diabetes mellitus without complication ([place])   . Hyperlipidemia   . Hypertension     Family History  Problem Relation Age of Onset  . Lymphoma Mother   . CVA Father   . Dementia Father   . Diabetes Neg Hx   . Prostate cancer Neg Hx   . Colon polyps Neg Hx   . Colon cancer Neg Hx   . Rectal cancer Neg Hx   . Stomach cancer Neg Hx   . Esophageal cancer Neg Hx      Past Surgical History:  Procedure Laterality Date  . ACHILLES TENDON SURGERY Right   . AMPUTATION Right 08/21/2020   Procedure: RIGHT FOOT 5TH RAY AMPUTATION;  Surgeon: Newt Minion, MD;  Location: [place];  Service: Orthopedics;  Laterality: Right;  . APPLICATION OF A-CELL OF BACK N/A 02/10/2016   Procedure: APPLICATION OF A-CELL OF sacral wound;  Surgeon: Loel Lofty Dillingham, DO;  Location: [facility];  Service: Plastics;  Laterality: N/A;  . APPLICATION OF WOUND VAC N/A 02/10/2016   Procedure: APPLICATION OF WOUND VAC possible;  Surgeon: Loel Lofty Dillingham, DO;  Location: [facility];  Service: Plastics;  Laterality: N/A;  . COLONOSCOPY    . I & D EXTREMITY N/A 01/30/2016   Procedure: IRRIGATION AND DEBRIDEMENT OF THE SACRUM;  Surgeon: Loel Lofty Dillingham, DO;  Location: [place];  Service: Plastics;  Laterality: N/A;  . INCISION AND DRAINAGE OF WOUND N/A 02/10/2016   Procedure: IRRIGATION AND DEBRIDEMENT sacral WOUND;  Surgeon: Loel Lofty Dillingham, DO;  Location: [facility];  Service: Plastics;  Laterality: N/A;  . IR GENERIC HISTORICAL  02/11/2016   IR CATHETER TUBE CHANGE 02/11/2016 Aletta Edouard, MD [facility]  . IR GENERIC HISTORICAL  03/16/2016   IR CATHETER TUBE CHANGE 03/16/2016 Sandi Mariscal, MD [facility]  . IR GENERIC HISTORICAL  05/11/2016   IR CATHETER TUBE CHANGE 05/11/2016 Aletta Edouard, MD [facility]  . POLYPECTOMY    . SACRO-ILIAC PINNING N/A 01/16/2016   Procedure: Jenetta Downer;  Surgeon: Altamese Brigantine, MD;  Location: [place];  Service: Orthopedics;  Laterality: N/A;  . SUPRAPUBIC CATHETER INSERTION     s/p removal 2018  . TONSILLECTOMY    . ULNAR NERVE TRANSPOSITION Right    Social History   Occupational History  . Not on file  Tobacco Use  . Smoking status: Never Smoker  . Smokeless tobacco: Never Used  Substance and Sexual Activity  . Alcohol use: No  . Drug use: No  . Sexual activity: Not Currently

## 2020-08-28 ENCOUNTER — Encounter: Payer: Medicare Other | Admitting: Family

## 2020-08-29 DIAGNOSIS — M86172 Other acute osteomyelitis, left ankle and foot: Secondary | ICD-10-CM | POA: Diagnosis not present

## 2020-08-29 DIAGNOSIS — L03115 Cellulitis of right lower limb: Secondary | ICD-10-CM | POA: Diagnosis not present

## 2020-08-29 DIAGNOSIS — E11621 Type 2 diabetes mellitus with foot ulcer: Secondary | ICD-10-CM | POA: Diagnosis not present

## 2020-08-29 DIAGNOSIS — I10 Essential (primary) hypertension: Secondary | ICD-10-CM | POA: Diagnosis not present

## 2020-08-30 ENCOUNTER — Other Ambulatory Visit: Payer: Self-pay

## 2020-08-30 ENCOUNTER — Encounter: Payer: Self-pay | Admitting: Family Medicine

## 2020-08-30 ENCOUNTER — Ambulatory Visit (INDEPENDENT_AMBULATORY_CARE_PROVIDER_SITE_OTHER): Payer: Medicare Other | Admitting: Family Medicine

## 2020-08-30 VITALS — BP 140/68 | HR 104 | Temp 97.3°F | Ht 74.0 in | Wt 217.0 lb

## 2020-08-30 DIAGNOSIS — H524 Presbyopia: Secondary | ICD-10-CM | POA: Diagnosis not present

## 2020-08-30 DIAGNOSIS — Z961 Presence of intraocular lens: Secondary | ICD-10-CM | POA: Diagnosis not present

## 2020-08-30 DIAGNOSIS — E1142 Type 2 diabetes mellitus with diabetic polyneuropathy: Secondary | ICD-10-CM

## 2020-08-30 DIAGNOSIS — E119 Type 2 diabetes mellitus without complications: Secondary | ICD-10-CM | POA: Diagnosis not present

## 2020-08-30 LAB — HM DIABETES EYE EXAM

## 2020-08-30 NOTE — Patient Instructions (Addendum)
Go to the lab on the way out.   If you have mychart we'll likely use that to update you.    Keep the area clean and covered and update me about your sugar and foot in about 2 weeks.  Plan on recheck here in about 3 months.  Take care.  Glad to see you.

## 2020-08-30 NOTE — Progress Notes (Signed)
This visit occurred during the SARS-CoV-2 public health emergency.  Safety protocols were in place, including screening questions prior to the visit, additional usage of staff PPE, and extensive cleaning of exam room while observing appropriate contact time as indicated for disinfecting solutions.  Inpatient f/u.  History of osteomyelitis status post fifth ray resection.  He had wound vac prev in place prev, for 4 days.  Wound VAC discontinued in the meantime.   No FCNAVD.  No pain.  Not draining pus.  Some minimal blood oozing with bandage changes.  Using crutches at baseline.    Diabetes discussed with patient.  32 units insulin, taking metformin BID.  3 glipizide tabs per day.  Sugar 131 this AM, 151 yesterday this AM.  He had ortho f/u pending.  He had eye exam this AM.    Meds, vitals, and allergies reviewed.   ROS: Per HPI unless specifically indicated in ROS section   GEN: nad, alert and oriented, walking with crutches. HEENT: ncat NECK: supple w/o LA CV: rrr.  PULM: ctab, no inc wob ABD: soft, +bs EXT: no edema SKIN: no acute rash Right fifth ray amputation noted with sutures intact.  Wound is clean and dry and intact.  No spreading erythema or purulent discharge.  He has some irritation on the dorsal aspect of the right first toe, 5 x 5 mm.  It is proximal to the nailbed.  No spreading erythema.  He has a 3 x 2 cm first metatarsal plantar callus noted on the right foot.  There is a central 2 mm defect that is clean, centrally in the callus.  The entire callus was shaved down with a #12 blade without complication and tolerated well after cleaning the area locally.  Foot redressed in the meantime.  35 minutes were devoted to patient care in this encounter (this includes time spent reviewing the patient's file/history, interviewing and examining the patient, counseling/reviewing plan with patient).

## 2020-08-31 LAB — CBC WITH DIFFERENTIAL/PLATELET
Absolute Monocytes: 681 cells/uL (ref 200–950)
Basophils Absolute: 74 cells/uL (ref 0–200)
Basophils Relative: 0.8 %
Eosinophils Absolute: 175 cells/uL (ref 15–500)
Eosinophils Relative: 1.9 %
HCT: 38 % — ABNORMAL LOW (ref 38.5–50.0)
Hemoglobin: 12.4 g/dL — ABNORMAL LOW (ref 13.2–17.1)
Lymphs Abs: 2778 cells/uL (ref 850–3900)
MCH: 29.4 pg (ref 27.0–33.0)
MCHC: 32.6 g/dL (ref 32.0–36.0)
MCV: 90 fL (ref 80.0–100.0)
MPV: 11 fL (ref 7.5–12.5)
Monocytes Relative: 7.4 %
Neutro Abs: 5492 cells/uL (ref 1500–7800)
Neutrophils Relative %: 59.7 %
Platelets: 222 10*3/uL (ref 140–400)
RBC: 4.22 10*6/uL (ref 4.20–5.80)
RDW: 13.7 % (ref 11.0–15.0)
Total Lymphocyte: 30.2 %
WBC: 9.2 10*3/uL (ref 3.8–10.8)

## 2020-08-31 LAB — BASIC METABOLIC PANEL
BUN: 17 mg/dL (ref 7–25)
CO2: 24 mmol/L (ref 20–32)
Calcium: 9.1 mg/dL (ref 8.6–10.3)
Chloride: 106 mmol/L (ref 98–110)
Creat: 1.1 mg/dL (ref 0.70–1.18)
Glucose, Bld: 264 mg/dL — ABNORMAL HIGH (ref 65–99)
Potassium: 4.8 mmol/L (ref 3.5–5.3)
Sodium: 143 mmol/L (ref 135–146)

## 2020-09-01 NOTE — Assessment & Plan Note (Signed)
Discussed options, status post amputation.  Goal for improved diabetic control in the meantime.  His sugar has been reasonably controlled at home in the meantime with his current regimen with 32 units of insulin and 3 glipizide per day.  Would continue as is for now.    He will keep the area clean and covered and update me about his sugar and foot in about 2 weeks.  Plan on recheck here in about 3 months, sooner if needed.  He has orthopedic follow-up pending to recheck his foot and I appreciate the help of orthopedics.  Follow-up labs pending.  See notes on labs.  Foot care discussed with patient.

## 2020-09-03 ENCOUNTER — Ambulatory Visit (INDEPENDENT_AMBULATORY_CARE_PROVIDER_SITE_OTHER): Payer: Medicare Other | Admitting: Family

## 2020-09-03 ENCOUNTER — Encounter: Payer: Self-pay | Admitting: Family

## 2020-09-03 DIAGNOSIS — M869 Osteomyelitis, unspecified: Secondary | ICD-10-CM

## 2020-09-03 NOTE — Progress Notes (Signed)
Post-Op Visit Note   Patient: Christian [name]           Date of Birth: 05-05-1940           MRN: [id number] Visit Date: 09/03/2020 PCP: Tonia Ghent, MD  Chief Complaint: No chief complaint on file.   HPI:  HPI The patient is 80 year old gentleman seen 2 weeks status post 5th ray amputation.has been full weight bearing with crutches.  Ortho Exam Incision well approximated with sutures, healing well. Scattered eschar. No erythema, warmth. No sign on infection.  Visit Diagnoses: No diagnosis found.  Plan: will hold one more week for suture removal. Continue with current  Follow-Up Instructions: No follow-ups on file.   Imaging: No results found.  Orders:  No orders of the defined types were placed in this encounter.  No orders of the defined types were placed in this encounter.    PMFS History: Patient Active Problem List   Diagnosis Date Noted  . Osteomyelitis of fifth toe of right foot ([place])   . Diabetic foot infection ([place]) 08/18/2020  . Diabetic foot ulcer ([place]) 08/11/2020  . Healthcare maintenance 01/10/2020  . Lower back pain 09/27/2019  . Thumb pain 06/25/2019  . Medicare annual wellness visit, subsequent 03/12/2018  . Advance care planning 03/12/2018  . Shoulder pain 02/03/2017  . Edema 11/02/2016  . Diabetes mellitus with microalbuminuria ([place]) 06/10/2016  . HLD (hyperlipidemia) 06/10/2016  . DM type 2 with diabetic peripheral neuropathy ([place])   . HTN (hypertension)   . Accident caused by farm tractor 01/15/2016  . Colon polyps 06/24/2013   Past Medical History:  Diagnosis Date  . Arthritis   . Blood transfusion without reported diagnosis    ? in 2017 after crushing injury   . Cataract    right eye removed   . Crushing injury of pelvic region   . Diabetes mellitus without complication ([place])   . Hyperlipidemia   . Hypertension     Family History  Problem Relation Age of Onset  . Lymphoma Mother   . CVA Father   . Dementia Father   .  Diabetes Neg Hx   . Prostate cancer Neg Hx   . Colon polyps Neg Hx   . Colon cancer Neg Hx   . Rectal cancer Neg Hx   . Stomach cancer Neg Hx   . Esophageal cancer Neg Hx     Past Surgical History:  Procedure Laterality Date  . ACHILLES TENDON SURGERY Right   . AMPUTATION Right 08/21/2020   Procedure: RIGHT FOOT 5TH RAY AMPUTATION;  Surgeon: Newt Minion, MD;  Location: [place];  Service: Orthopedics;  Laterality: Right;  . APPLICATION OF A-CELL OF BACK N/A 02/10/2016   Procedure: APPLICATION OF A-CELL OF sacral wound;  Surgeon: Loel Lofty Dillingham, DO;  Location: [place];  Service: Plastics;  Laterality: N/A;  . APPLICATION OF WOUND VAC N/A 02/10/2016   Procedure: APPLICATION OF WOUND VAC possible;  Surgeon: Loel Lofty Dillingham, DO;  Location: [place];  Service: Plastics;  Laterality: N/A;  . COLONOSCOPY    . I & D EXTREMITY N/A 01/30/2016   Procedure: IRRIGATION AND DEBRIDEMENT OF THE SACRUM;  Surgeon: Loel Lofty Dillingham, DO;  Location: [place];  Service: Plastics;  Laterality: N/A;  . INCISION AND DRAINAGE OF WOUND N/A 02/10/2016   Procedure: IRRIGATION AND DEBRIDEMENT sacral WOUND;  Surgeon: Loel Lofty Dillingham, DO;  Location: [facility];  Service: Plastics;  Laterality: N/A;  . IR GENERIC HISTORICAL  02/11/2016   IR CATHETER TUBE CHANGE 02/11/2016 Aletta Edouard, MD [facility]  . IR GENERIC HISTORICAL  03/16/2016   IR CATHETER TUBE CHANGE 03/16/2016 Sandi Mariscal, MD [facility]  . IR GENERIC HISTORICAL  05/11/2016   IR CATHETER TUBE CHANGE 05/11/2016 Aletta Edouard, MD [facility]  . POLYPECTOMY    . SACRO-ILIAC PINNING N/A 01/16/2016   Procedure: Jenetta Downer;  Surgeon: Altamese [place], MD;  Location: [facility];  Service: Orthopedics;  Laterality: N/A;  . SUPRAPUBIC CATHETER INSERTION     s/p removal 2018  . TONSILLECTOMY    . ULNAR NERVE TRANSPOSITION Right    Social History   Occupational History  . Not on file  Tobacco Use  . Smoking status: Never Smoker  . Smokeless  tobacco: Never Used  Substance and Sexual Activity  . Alcohol use: No  . Drug use: No  . Sexual activity: Not Currently

## 2020-09-04 ENCOUNTER — Encounter: Payer: Self-pay | Admitting: Family

## 2020-09-05 DIAGNOSIS — E11621 Type 2 diabetes mellitus with foot ulcer: Secondary | ICD-10-CM | POA: Diagnosis not present

## 2020-09-05 DIAGNOSIS — M86172 Other acute osteomyelitis, left ankle and foot: Secondary | ICD-10-CM | POA: Diagnosis not present

## 2020-09-05 DIAGNOSIS — L03115 Cellulitis of right lower limb: Secondary | ICD-10-CM | POA: Diagnosis not present

## 2020-09-05 DIAGNOSIS — I10 Essential (primary) hypertension: Secondary | ICD-10-CM | POA: Diagnosis not present

## 2020-09-11 ENCOUNTER — Encounter: Payer: Self-pay | Admitting: Family

## 2020-09-11 ENCOUNTER — Ambulatory Visit (INDEPENDENT_AMBULATORY_CARE_PROVIDER_SITE_OTHER): Payer: Medicare Other | Admitting: Family

## 2020-09-11 ENCOUNTER — Other Ambulatory Visit: Payer: Self-pay

## 2020-09-11 ENCOUNTER — Telehealth: Payer: Self-pay | Admitting: Family Medicine

## 2020-09-11 DIAGNOSIS — M869 Osteomyelitis, unspecified: Secondary | ICD-10-CM

## 2020-09-11 MED ORDER — AMLODIPINE BESYLATE 2.5 MG PO TABS: 5.0000 mg | ORAL_TABLET | Freq: Every day | ORAL | Status: DC

## 2020-09-11 NOTE — Progress Notes (Signed)
Post-Op Visit Note   Patient: Christian [name]           Date of Birth: 09-Jan-1941           MRN: [id number] Visit Date: 09/11/2020 PCP: Tonia Ghent, MD  Chief Complaint:  Chief Complaint  Patient presents with  . Right Foot - Routine Post Op    08/21/20 right 5th ray amputation     HPI:  HPI The patient is a 80 year old gentleman seen status post right foot fifth ray amputation he feels well he has been doing dry dressing changes he is full weightbearing with a walking stick Ortho Exam On examination of his right foot fifth ray amputation incision this is healing well distally there is 2 areas with 5 mm in diameter ulcerations adjacent to his incision sutures are in place there is no gaping drainage no erythema no sign of infection  Visit Diagnoses: No diagnosis found.  Plan: Sutures harvested today without incident.  He will continue with daily Dial soap cleansing.  Dry dressing changes.  No changes to his weightbearing.  He will follow-up once more in 2 weeks.  Anticipate discharging at that time  Follow-Up Instructions: No follow-ups on file.   Imaging: No results found.  Orders:  No orders of the defined types were placed in this encounter.  No orders of the defined types were placed in this encounter.    PMFS History: Patient Active Problem List   Diagnosis Date Noted  . Osteomyelitis of fifth toe of right foot ([place])   . Diabetic foot infection ([place]) 08/18/2020  . Diabetic foot ulcer ([place]) 08/11/2020  . Healthcare maintenance 01/10/2020  . Lower back pain 09/27/2019  . Thumb pain 06/25/2019  . Medicare annual wellness visit, subsequent 03/12/2018  . Advance care planning 03/12/2018  . Shoulder pain 02/03/2017  . Edema 11/02/2016  . Diabetes mellitus with microalbuminuria ([place]) 06/10/2016  . HLD (hyperlipidemia) 06/10/2016  . DM type 2 with diabetic peripheral neuropathy ([place])   . HTN (hypertension)   . Accident caused by farm tractor 01/15/2016  . Colon  polyps 06/24/2013   Past Medical History:  Diagnosis Date  . Arthritis   . Blood transfusion without reported diagnosis    ? in 2017 after crushing injury   . Cataract    right eye removed   . Crushing injury of pelvic region   . Diabetes mellitus without complication ([place])   . Hyperlipidemia   . Hypertension     Family History  Problem Relation Age of Onset  . Lymphoma Mother   . CVA Father   . Dementia Father   . Diabetes Neg Hx   . Prostate cancer Neg Hx   . Colon polyps Neg Hx   . Colon cancer Neg Hx   . Rectal cancer Neg Hx   . Stomach cancer Neg Hx   . Esophageal cancer Neg Hx     Past Surgical History:  Procedure Laterality Date  . ACHILLES TENDON SURGERY Right   . AMPUTATION Right 08/21/2020   Procedure: RIGHT FOOT 5TH RAY AMPUTATION;  Surgeon: Newt Minion, MD;  Location: [place];  Service: Orthopedics;  Laterality: Right;  . APPLICATION OF A-CELL OF BACK N/A 02/10/2016   Procedure: APPLICATION OF A-CELL OF sacral wound;  Surgeon: Loel Lofty Dillingham, DO;  Location: [facility];  Service: Plastics;  Laterality: N/A;  . APPLICATION OF WOUND VAC N/A 02/10/2016   Procedure: APPLICATION OF WOUND VAC possible;  Surgeon: Loel Lofty Dillingham,  DO;  Location: [place];  Service: Plastics;  Laterality: N/A;  . COLONOSCOPY    . I & D EXTREMITY N/A 01/30/2016   Procedure: IRRIGATION AND DEBRIDEMENT OF THE SACRUM;  Surgeon: Loel Lofty Dillingham, DO;  Location: [place];  Service: Plastics;  Laterality: N/A;  . INCISION AND DRAINAGE OF WOUND N/A 02/10/2016   Procedure: IRRIGATION AND DEBRIDEMENT sacral WOUND;  Surgeon: Loel Lofty Dillingham, DO;  Location: [facility];  Service: Plastics;  Laterality: N/A;  . IR GENERIC HISTORICAL  02/11/2016   IR CATHETER TUBE CHANGE 02/11/2016 Aletta Edouard, MD [facility]  . IR GENERIC HISTORICAL  03/16/2016   IR CATHETER TUBE CHANGE 03/16/2016 Sandi Mariscal, MD [facility]  . IR GENERIC HISTORICAL  05/11/2016   IR CATHETER TUBE CHANGE 05/11/2016 Aletta Edouard,  MD [facility]  . POLYPECTOMY    . SACRO-ILIAC PINNING N/A 01/16/2016   Procedure: Jenetta Downer;  Surgeon: Altamese [place], MD;  Location: [place];  Service: Orthopedics;  Laterality: N/A;  . SUPRAPUBIC CATHETER INSERTION     s/p removal 2018  . TONSILLECTOMY    . ULNAR NERVE TRANSPOSITION Right    Social History   Occupational History  . Not on file  Tobacco Use  . Smoking status: Never Smoker  . Smokeless tobacco: Never Used  Substance and Sexual Activity  . Alcohol use: No  . Drug use: No  . Sexual activity: Not Currently

## 2020-09-11 NOTE — Telephone Encounter (Signed)
Please verify that he is not lightheaded.  If not lightheaded and if currently taking amlodipine 2.5 mg a day, then I would increase to 5 mg a day.  That would be 2 of the 2.5 mg tablets.  Please update me about his blood pressure in about 2 weeks.  If he gets lightheaded on the higher dose of medication or if he has more trouble with lower extremity swelling then cut back to 2.5 mg a day.  Thanks.

## 2020-09-11 NOTE — Telephone Encounter (Signed)
Dreamea From [employer] is calling to inform Dr. [name] that when taking pt's blood pressure. At 10:15am Left 150/80 Right 158/90. no symptoms.

## 2020-09-13 DIAGNOSIS — L03115 Cellulitis of right lower limb: Secondary | ICD-10-CM | POA: Diagnosis not present

## 2020-09-13 DIAGNOSIS — E11621 Type 2 diabetes mellitus with foot ulcer: Secondary | ICD-10-CM | POA: Diagnosis not present

## 2020-09-13 DIAGNOSIS — M86172 Other acute osteomyelitis, left ankle and foot: Secondary | ICD-10-CM | POA: Diagnosis not present

## 2020-09-13 DIAGNOSIS — I10 Essential (primary) hypertension: Secondary | ICD-10-CM | POA: Diagnosis not present

## 2020-09-13 NOTE — Telephone Encounter (Signed)
Patient has not been lightheaded; advised to increase amlodipine to 5 mg QD and patient agrees to do so. His BP this am was 178/95 and P 78 and BS 124; yesterday BP was 176/100 with P 87 and BS of 156. BS has normally been good and has staying under 150. His foot is healing well; no bleeding and has had minimal pain. Patient will call back with update on BP in 2 weeks. Advised to go back to 2.5 mg tablets QD if he develops any lightheadedness or leg swelling and to call the office.

## 2020-09-13 NOTE — Telephone Encounter (Signed)
Noted. Thanks.

## 2020-09-16 NOTE — Telephone Encounter (Signed)
I recommend he come in for BP cuff accuracy check. He is still having high BP and some recent dizziness. Will route to PCP as well for approval.  Debbora Dus, PharmD Clinical Pharmacist [facility] [phone]

## 2020-09-17 DIAGNOSIS — I10 Essential (primary) hypertension: Secondary | ICD-10-CM | POA: Diagnosis not present

## 2020-09-17 DIAGNOSIS — L03115 Cellulitis of right lower limb: Secondary | ICD-10-CM | POA: Diagnosis not present

## 2020-09-17 DIAGNOSIS — E11621 Type 2 diabetes mellitus with foot ulcer: Secondary | ICD-10-CM | POA: Diagnosis not present

## 2020-09-17 DIAGNOSIS — M86172 Other acute osteomyelitis, left ankle and foot: Secondary | ICD-10-CM | POA: Diagnosis not present

## 2020-09-17 NOTE — Telephone Encounter (Signed)
Contacted pt and scheduled him for NV for tomorrow at 3pm. Pt will bring his BP cuff.

## 2020-09-17 NOTE — Telephone Encounter (Signed)
Agreed, please schedule.  Thanks.

## 2020-09-18 ENCOUNTER — Other Ambulatory Visit: Payer: Self-pay

## 2020-09-18 ENCOUNTER — Ambulatory Visit (INDEPENDENT_AMBULATORY_CARE_PROVIDER_SITE_OTHER): Payer: Medicare Other

## 2020-09-18 DIAGNOSIS — I10 Essential (primary) hypertension: Secondary | ICD-10-CM

## 2020-09-18 NOTE — Progress Notes (Signed)
Pt in today to have his BP cuff checked to see if pt is using correctly and if it is accurate. Also to educate pt on low sodium diet.   Pt brought his automatic BP cuff. Asked pt to show how he takes his BP. Pt placed the cuff and took BP reading which was 139/90, HR 91. Observation showed pt had the cuff on upside-down and backwards.  Educated on proper placement of cuff and asked him to put on his cuff again and take BP again. Pt did take BP again with his cuff and it was 124/84, HR 91. Used clinic automated BP cuff and reading was 128/83, HR 90.   Educated pt his BP cuff was accurate but he was putting it on his arm at the wrong location. Asked the pt to again show me how the BP cuff should be put on his arm. Pt gave demonstration and placed cuff completely correct.   Advised pt about low sodium diet and printed material from [employer] about hypertension which also included a low sodium diet. Advised pt how to count sodium for the day and what the sodium level for the day should be. Inquired if pt had any questions. Pt denied any questions. Advised if he had any questions in the future to contact the office. Pt verbalized understanding.

## 2020-09-24 ENCOUNTER — Telehealth: Payer: Self-pay

## 2020-09-24 ENCOUNTER — Other Ambulatory Visit: Payer: Self-pay

## 2020-09-24 ENCOUNTER — Ambulatory Visit (INDEPENDENT_AMBULATORY_CARE_PROVIDER_SITE_OTHER): Payer: Medicare Other

## 2020-09-24 DIAGNOSIS — I10 Essential (primary) hypertension: Secondary | ICD-10-CM | POA: Diagnosis not present

## 2020-09-24 DIAGNOSIS — E1142 Type 2 diabetes mellitus with diabetic polyneuropathy: Secondary | ICD-10-CM

## 2020-09-24 DIAGNOSIS — E785 Hyperlipidemia, unspecified: Secondary | ICD-10-CM

## 2020-09-24 NOTE — Progress Notes (Signed)
Chronic Care Management  Pharmacy Note  09/24/2020 Name:  Christian [name] MRN:  [id number] DOB:  03-12-1941  Summary:  Home BP remain elevated > 140/90 - see telephone note titled Hypertension 09/27/20. Fasting BG elevated 150-200, patient had self decreased his metformin to 500 mg twice daily reportedly due to improved BG. Asked him to resume prescribed dose of 1000 mg BID. He is past due for some of his medications by refill history. Discussed adherence packs.  Recommendations: Resume prescribed dose of metformin 1000 mg twice daily. Consult PCP regarding HTN.  Plan: Start adherence packaging through [facility] Follow up within 30 days for BP and BG logs  See telephone note 09/27/20 for consultation with Dr. [name]   Subjective: Christian [name] is an 80 y.o. year old male who is a primary patient of [name], [name], MD.  The CCM team was consulted for assistance with disease management and care coordination needs.    Engaged with patient by telephone for follow up visit in response to provider referral for pharmacy case management   Consent to Services:  The patient was given information about Chronic Care Management services, agreed to services, and gave verbal consent prior to initiation of services.  Please see initial visit note for detailed documentation.   Patient Care Team: Tonia Ghent, MD as PCP - General (Family Medicine) Debbora Dus, [facility] as Pharmacist (Pharmacist)  Recent office visits: 09/18/20 - Nurse visit - review BP cuff use and low sodium diet  09/11/20 - House call - elevated BP, increase amlodipine to 5 mg daily  08/30/20 - PCP - S/p amputation. Improve DM control. 32 units insulin 3 glipizide/day. Continue as is for now.  Recent consult visits: 09/11/20 - Orthopedics  09/03/20 [facility] visits: 07/27/20 - ED/Urgent care - Diabetic foot ulcer  Objective:  Lab Results  Component Value Date   CREATININE 1.10 08/30/2020   BUN 17 08/30/2020    GFR 66.20 08/08/2020   GFRNONAA >60 08/22/2020   GFRAA >60 05/17/2016   NA 143 08/30/2020   K 4.8 08/30/2020   CALCIUM 9.1 08/30/2020   CO2 24 08/30/2020   GLUCOSE 264 (H) 08/30/2020    Lab Results  Component Value Date/Time   HGBA1C 9.1 (H) 08/08/2020 10:03 AM   HGBA1C 9.5 (A) 04/16/2020 09:27 AM   HGBA1C 9.8 (H) 01/04/2020 08:02 AM   GFR 66.20 08/08/2020 10:03 AM   GFR 49.73 (L) 05/09/2020 11:23 AM   MICROALBUR 1.6 01/04/2020 08:02 AM   MICROALBUR 0.7 03/07/2018 09:41 AM    Last diabetic Eye exam:  Lab Results  Component Value Date/Time   HMDIABEYEEXA No Retinopathy 08/30/2020 12:00 AM    Last diabetic Foot exam: 08/08/20  PCP diabetic foot ulcer  Lab Results  Component Value Date   CHOL 85 01/04/2020   HDL 27.70 (L) 01/04/2020   LDLDIRECT 19.0 01/04/2020   TRIG 391.0 (H) 01/04/2020   CHOLHDL 3 01/04/2020    Hepatic Function Latest Ref Rng & Units 08/18/2020 01/04/2020 03/01/2018  Total Protein 6.5 - 8.1 g/dL 6.7 7.3 7.3  Albumin 3.5 - 5.0 g/dL 3.3(L) 4.2 4.3  AST 15 - 41 U/L _0 ALT 0 - 44 U/L _1 Alk Phosphatase 38 - 126 U/L 55 64 74  Total Bilirubin 0.3 - 1.2 mg/dL 0.9 0.9 0.8    Lab Results  Component Value Date/Time   TSH 3.337 03/06/2016 03:45 AM    CBC Latest Ref Rng & Units  08/30/2020 08/22/2020 08/19/2020  WBC 3.8 - 10.8 Thousand/uL 9.2 8.7 7.2  Hemoglobin 13.2 - 17.1 g/dL 12.4(L) 11.8(L) 10.7(L)  Hematocrit 38.5 - 50.0 % 38.0(L) 36.6(L) 33.6(L)  Platelets 140 - 400 Thousand/uL 222 232 185   Clinical ASCVD: No  The ASCVD Risk score Mikey Bussing DC Jr., et al., 2013) failed to calculate for the following reasons:   The valid total cholesterol range is 130 to 320 mg/dL    Depression screen [facility] 2/9 04/16/2020 03/08/2018 06/03/2016  Decreased Interest 0 0 0  Down, Depressed, Hopeless 0 0 0  PHQ - 2 Score 0 0 0    Social History   Tobacco Use  Smoking Status Never  Smokeless Tobacco Never   BP Readings from Last 3 Encounters:  08/30/20 140/68   08/22/20 (!) 150/77  08/08/20 126/80   Pulse Readings from Last 3 Encounters:  08/30/20 (!) 104  08/22/20 62  08/08/20 70   Wt Readings from Last 3 Encounters:  08/30/20 217 lb (98.4 kg)  08/08/20 217 lb (98.4 kg)  05/20/20 217 lb (98.4 kg)   BMI Readings from Last 3 Encounters:  08/30/20 27.86 kg/m  08/08/20 27.86 kg/m  05/20/20 27.86 kg/m    Assessment/Interventions: Review of patient past medical history, allergies, medications, health status, including review of consultants reports, laboratory and other test data, was performed as part of comprehensive evaluation and provision of chronic care management services.   SDOH:  (Social Determinants of Health) assessments and interventions performed: Yes SDOH Interventions    Flowsheet Row Most Recent Value  SDOH Interventions   Financial Strain Interventions Intervention Not Indicated       SDOH Screenings   Alcohol Screen: Not on file  Depression (PHQ2-9): Low Risk    PHQ-2 Score: 0  Financial Resource Strain: Low Risk    Difficulty of Paying Living Expenses: Not very hard  Food Insecurity: Not on file  Housing: Not on file  Physical Activity: Not on file  Social Connections: Not on file  Stress: Not on file  Tobacco Use: Low Risk    Smoking Tobacco Use: Never   Smokeless Tobacco Use: Never  Transportation Needs: Not on file    [place]  No Known Allergies  Medications Reviewed Today     Reviewed by Debbora Dus, [facility] (Pharmacist) on 09/27/20 at [place] List Status: <None>   Medication Order Taking? Sig Documenting Provider Last Dose Status Informant  amLODipine (NORVASC) 2.5 MG tablet [id number] Yes Take 2 tablets (5 mg total) by mouth daily. Tonia Ghent, MD Taking Active   atorvastatin (LIPITOR) 80 MG tablet [id number] Yes Take 1 tablet (80 mg total) by mouth at bedtime. Tonia Ghent, MD Taking Active Self  doxycycline (VIBRA-TABS) 100 MG tablet [id number]  Take 1 tablet (100 mg total)  by mouth 2 (two) times daily. Persons, [place], [place]  Active   glipiZIDE (GLUCOTROL) 5 MG tablet [id number] Yes Take 2 tablets by mouth in the morning and 1 tablet by mouth at night. Tonia Ghent, MD Taking Active Self  glucose blood [facility] ULTRA) test strip [id number] Yes USE 1 STRIP TO CHECK GLUCOSE ONCE DAILY Tonia Ghent, MD Taking Active Self  insulin detemir (LEVEMIR FLEXTOUCH) 100 UNIT/ML FlexPen [id number] Yes INJECT UP TO 20-36 UNITS SUBCUTANEOUSLY ONCE DAILY Tonia Ghent, MD Taking Active   Insulin Pen Needle (RELION PEN NEEDLE 31G/8MM) 31G X 8 MM MISC [id number] Yes USE TO INJECT LEVEMIR DAILY Tonia Ghent, MD Taking Active Self  lisinopril (ZESTRIL) 30 MG tablet [id number] Yes Take 1 tablet (30 mg total) by mouth daily. Tonia Ghent, MD Taking Active Self  metFORMIN (GLUCOPHAGE) 1000 MG tablet [id number] Yes Take 1 tablet (1,000 mg total) by mouth 2 (two) times daily with a meal.  Patient taking differently: Take 1,000 mg by mouth 2 (two) times daily with a meal. Taking differently: 500 mg twice daily   Tonia Ghent, MD Taking Active Self            Patient Active Problem List   Diagnosis Date Noted   Osteomyelitis of fifth toe of right foot ([place])    Diabetic foot infection ([place]) 08/18/2020   Diabetic foot ulcer ([place]) 08/11/2020   Healthcare maintenance 01/10/2020   Lower back pain 09/27/2019   Thumb pain 06/25/2019   Medicare annual wellness visit, subsequent 03/12/2018   Advance care planning 03/12/2018   Shoulder pain 02/03/2017   Edema 11/02/2016   Diabetes mellitus with microalbuminuria ([place]) 06/10/2016   HLD (hyperlipidemia) 06/10/2016   DM type 2 with diabetic peripheral neuropathy ([place])    HTN (hypertension)    Accident caused by farm tractor 01/15/2016   Colon polyps 06/24/2013    Immunization History  Administered Date(s) Administered   Fluad Quad(high Dose 65+) 01/05/2019, 01/09/2020   Influenza,inj,Quad PF,6+ Mos 02/20/2013,  01/03/2014, 02/05/2015, 01/15/2016, 03/26/2016, 02/02/2017   Influenza-Unspecified 04/13/2010, 01/11/2018   PFIZER(Purple Top)SARS-COV-2 Vaccination 05/23/2019, 06/17/2019   Pneumococcal Conjugate-13 01/06/2012, 01/03/2014   Pneumococcal Polysaccharide-23 12/19/2012   Td 05/14/2014   Zoster Recombinat (Shingrix) 10/27/2018, 01/31/2019   Zoster, Live 12/19/2014    Conditions to be addressed/monitored:  Hypertension, Hyperlipidemia and Diabetes  Care Plan : [place]  Updates made by Debbora Dus, [place] since 09/27/2020 12:00 AM     Problem: CHL AMB "PATIENT-SPECIFIC PROBLEM"      Long-Range Goal: Disease Management   Start Date: 07/18/2020  Priority: High  Note:   Current Barriers:  Unable to achieve control of blood pressure and blood sugar  Pharmacist Clinical Goal(s):  Patient will adhere to plan to optimize therapeutic regimen for blood pressure as evidenced by report of adherence to recommended medication management changes through collaboration with PharmD and provider.   Interventions: 1:1 collaboration with Tonia Ghent, MD regarding development and update of comprehensive plan of care as evidenced by provider attestation and co-signature Inter-disciplinary care team collaboration (see longitudinal plan of care) Comprehensive medication review performed; medication list updated in electronic medical record  Hypertension (BP goal <140/90) -Uncontrolled - clinic readings within goal, but home readings elevated  -Pt saw nurse on 6/8 and reviewed BP monitoring technique. He has started using it correctly since then. His amlodipine was increased from 2.5 mg to 5 mg 09/11/20.  Denies any swelling, dizziness of lightheadedness. -Current treatment: Amlodipine 5 mg - 1 tablet daily Lisinopril 30 mg - 1 tablets daily -Medications previously tried: none -Patient confirms daily adherence  -Uses an arm cuff for at home monitoring -Current home readings: checks daily  in morning before coffee or medications  09/19/20 Thursday - 168/94, 86   09/20/20 Friday - 179/100, 81   09/21/20 Saturday - 164/97   09/22/20 Sunday - 147/84, 98  09/23/20 Monday  - 158/90 09/24/20 Tuesday 185/98, HR 88 -Current dietary habits: trying to limit salt, low sodium diet was reviewed by nurse 09/11/20. -Takes morning meds at breakfast, night meds at 8:45 PM -He avoids frequent use of NSAIDs. -Denies hypotensive/hypertensive symptoms -Continue to monitor BP at home daily,  document, and provide log at future appointments -Recommended continue current medications; Consult PCP - consider additional therapy.  Hyperlipidemia: (LDL goal < 100) -Controlled - LDL 19 -Current treatent: Atorvastatin 80 mg - 1 daily -Refills timely, last filled 04/16/20 90 DS - DUE  -Patient reports daily adherence to 1 tablet atorvastatin 80 mg. His current bottle is dated 04/16/20 and is about half full. No clear reason for excess supply.  -Recommended to continue current medication; Recommend adherence packaging.   Diabetes (A1c goal <7%) -Uncontrolled  -A1c 9.1% -Pt reports he reduced the metformin to 1/2 BID (500 mg BID) since sugars were better. Unsure when. Last visit reported taking 1000 mg BID. He states PCP aware. Pt reports taking 34 units Levemir at night.  -Current medications: Levemir - Inject 34 units daily Metformin 1000 mg - 1 tablet twice daily (patient reports taking 1/2 (500 mg) BID) Glipizide 5 mg - 2 in the morning and 1 at night  -Medications previously tried: none reported -Current home glucose readings - last 7 days below, fasting fasting glucose: 188,167, 205, 153, 163, 200 (reports he had pimento cheese sandwich, doritos, tea for dinner the night before) -Recommended resume higher dose metformin - 1000 mg twice daily. Call if any upset stomach.  Patient Goals/Self-Care Activities Patient will:  - take medications as prescribed check glucose daily, document, and provide at future  appointments check blood pressure daily, document, and provide at future appointments  Follow Up Plan: Telephone appointment scheduled for: 30 days       Medication Assistance: None required.  Patient affirms current coverage meets needs.   Star Rating Drugs:  Medication:                Last Fill:         Day Supply Atorvastatin 48m      04/16/2020          90ds  - past due, he has #30 on hand  Glipizide 546m            07/21/2020        90ds     Lisinopril 3012m         07/19/2020          90ds Metformin 1000 mg    04/08/2020      90ds  - past due, he has #60 on hand   Patient's preferred pharmacy is: [facility] - [id number] [place] [id number]: [phone]: [phone] discussed benefits of [facility] for simplified adherence.  Verbal consent obtained for [facility] enhanced pharmacy services (medication synchronization, adherence packaging, delivery coordination). A medication sync plan was created to allow patient to get all medications delivered once every 30 to 90 days per patient preference. Patient understands they have freedom to choose pharmacy and clinical pharmacist will coordinate care between all prescribers and [facility].  He requests the following medications in adherence packaging. His estimated sync and pack date is: 10/24/20  Packs:  Amlodipine 2.5 mg - 2 tabs at breakfast Metformin 1000 mg - 1 tablet breakfast, 1 evening meal   Atorvastatin 80 mg - 1 evening meal  Glipizide 5 mg - 2 breakfast, 1 evening meal Lisinopril 30 mg - 1 evening meal  Prevagen Regular Strength - 1 at breakfast  Care Plan and Follow Up Patient Decision:  Patient agrees to Care Plan and Follow-up.  MicDebbora DusharmD  Clinical Pharmacist [facility] [phone]

## 2020-09-24 NOTE — Chronic Care Management (AMB) (Signed)
    Chronic Care Management Pharmacy Assistant   Name: Christian [name]  MRN: [id number] DOB: 09-Jul-1940  Reason for Encounter: [facility] Onboard Process  Medications: Outpatient Encounter Medications as of 09/24/2020  Medication Sig   amLODipine (NORVASC) 2.5 MG tablet Take 2 tablets (5 mg total) by mouth daily.   atorvastatin (LIPITOR) 80 MG tablet Take 1 tablet (80 mg total) by mouth at bedtime.   glipiZIDE (GLUCOTROL) 5 MG tablet Take 2 tablets by mouth in the morning and 1 tablet by mouth at night.   glucose blood (ONETOUCH ULTRA) test strip USE 1 STRIP TO CHECK GLUCOSE ONCE DAILY   insulin detemir (LEVEMIR FLEXTOUCH) 100 UNIT/ML FlexPen INJECT UP TO 20-36 UNITS SUBCUTANEOUSLY ONCE DAILY   Insulin Pen Needle (RELION PEN NEEDLE 31G/8MM) 31G X 8 MM MISC USE TO INJECT LEVEMIR DAILY   lisinopril (ZESTRIL) 30 MG tablet Take 1 tablet (30 mg total) by mouth daily.   metFORMIN (GLUCOPHAGE) 1000 MG tablet Take 1 tablet (1,000 mg total) by mouth 2 (two) times daily with a meal. (Patient taking differently: Take 1,000 mg by mouth 2 (two) times daily with a meal. Taking differently: 500 mg twice daily)   No facility-administered encounter medications on file as of 09/24/2020.   Following appointment with Christian [name] on 09/24/20 patient requested to onboard with [facility]. Medication count was completed at visit and onboarding form was completed. [place], [place] to coordinate patient's medications for delivery, med sync, and adherence packaging from [employer].  Follow-Up:  Comptroller and Patient Assistance Coordination  Christian [name], CPP notified  [name], [place] Pharmacy Assistant [phone]

## 2020-09-25 ENCOUNTER — Ambulatory Visit: Payer: Medicare Other | Admitting: Physician Assistant

## 2020-09-25 ENCOUNTER — Encounter: Payer: Self-pay | Admitting: Physician Assistant

## 2020-09-25 DIAGNOSIS — M869 Osteomyelitis, unspecified: Secondary | ICD-10-CM

## 2020-09-25 MED ORDER — DOXYCYCLINE HYCLATE 100 MG PO TABS: 100.0000 mg | ORAL_TABLET | Freq: Two times a day (BID) | ORAL | 0 refills | Status: DC

## 2020-09-25 NOTE — Progress Notes (Signed)
Office Visit Note   Patient: Christian [name]           Date of Birth: 01-13-1941           MRN: [id number] Visit Date: 09/25/2020              Requested by: Tonia Ghent, MD [place] [id number] PCP: Tonia Ghent, MD  No chief complaint on file.     HPI: Patient presents today follow-up for his fifth ray amputation.  Overall he is doing very well he presents in a regular shoe.  He still gets a spot of drainage along the incision and he places some Neosporin with a Band-Aid on it daily denies any increase in redness any pain  Assessment & Plan: Visit Diagnoses: No diagnosis found.  Plan: We will try a very short course of doxycycline.  Continue keeping this area covered follow-up in 3 weeks sooner if any concerns  Follow-Up Instructions: No follow-ups on file.   Ortho Exam  Patient is alert, oriented, no adenopathy, well-dressed, normal affect, normal respiratory effort. Examination demonstrates overall well-healed surgical incision without any surrounding cellulitis cellulitis no ascending cellulitis.  Swelling is well controlled.  He does have 1 area distally that does have a spot of drainage which has partially saturated through a Band-Aid since placement this morning.  Compartments are soft and nontender no signs of infection  Imaging: No results found. No images are attached to the encounter.  Labs: Lab Results  Component Value Date   HGBA1C 9.1 (H) 08/08/2020   HGBA1C 9.5 (A) 04/16/2020   HGBA1C 9.8 (H) 01/04/2020   ESRSEDRATE 35 (H) 08/08/2020   LABURIC 5.0 08/19/2020   REPTSTATUS 05/20/2016 FINAL 05/17/2016   CULT >=100,000 COLONIES/mL ESCHERICHIA COLI (A) 05/17/2016   LABORGA ESCHERICHIA COLI (A) 05/17/2016     Lab Results  Component Value Date   ALBUMIN 3.3 (L) 08/18/2020   ALBUMIN 4.2 01/04/2020   ALBUMIN 4.3 03/01/2018    Lab Results  Component Value Date   MG 1.4 (L) 03/06/2016   No results found for:  VD25OH  No results found for: PREALBUMIN CBC EXTENDED Latest Ref Rng & Units 08/30/2020 08/22/2020 08/19/2020  WBC 3.8 - 10.8 Thousand/uL 9.2 8.7 7.2  RBC 4.20 - 5.80 Million/uL 4.22 4.04(L) 3.67(L)  HGB 13.2 - 17.1 g/dL 12.4(L) 11.8(L) 10.7(L)  HCT 38.5 - 50.0 % 38.0(L) 36.6(L) 33.6(L)  PLT 140 - 400 Thousand/uL 222 232 185  NEUTROABS 1,500 - 7,800 cells/uL 5,492 - -  LYMPHSABS 850 - 3,900 cells/uL 2,778 - -     There is no height or weight on file to calculate BMI.  Orders:  No orders of the defined types were placed in this encounter.  Meds ordered this encounter  Medications   doxycycline (VIBRA-TABS) 100 MG tablet    Sig: Take 1 tablet (100 mg total) by mouth 2 (two) times daily.    Dispense:  14 tablet    Refill:  0     Procedures: No procedures performed  Clinical Data: No additional findings.  ROS:  All other systems negative, except as noted in the HPI. Review of Systems  Objective: Vital Signs: There were no vitals taken for this visit.  Specialty Comments:  No specialty comments available.  PMFS History: Patient Active Problem List   Diagnosis Date Noted   Osteomyelitis of fifth toe of right foot ([place])    Diabetic foot infection ([place]) 08/18/2020   Diabetic  foot ulcer ([place]) 08/11/2020   Healthcare maintenance 01/10/2020   Lower back pain 09/27/2019   Thumb pain 06/25/2019   Medicare annual wellness visit, subsequent 03/12/2018   Advance care planning 03/12/2018   Shoulder pain 02/03/2017   Edema 11/02/2016   Diabetes mellitus with microalbuminuria ([place]) 06/10/2016   HLD (hyperlipidemia) 06/10/2016   DM type 2 with diabetic peripheral neuropathy ([facility])    HTN (hypertension)    Accident caused by farm tractor 01/15/2016   Colon polyps 06/24/2013   Past Medical History:  Diagnosis Date   Arthritis    Blood transfusion without reported diagnosis    ? in 2017 after crushing injury    Cataract    right eye removed    Crushing injury of pelvic  region    Diabetes mellitus without complication ([place])    Hyperlipidemia    Hypertension     Family History  Problem Relation Age of Onset   Lymphoma Mother    CVA Father    Dementia Father    Diabetes Neg Hx    Prostate cancer Neg Hx    Colon polyps Neg Hx    Colon cancer Neg Hx    Rectal cancer Neg Hx    Stomach cancer Neg Hx    Esophageal cancer Neg Hx     Past Surgical History:  Procedure Laterality Date   ACHILLES TENDON SURGERY Right    AMPUTATION Right 08/21/2020   Procedure: RIGHT FOOT 5TH RAY AMPUTATION;  Surgeon: Newt Minion, MD;  Location: [facility];  Service: Orthopedics;  Laterality: Right;   APPLICATION OF A-CELL OF BACK N/A 02/10/2016   Procedure: APPLICATION OF A-CELL OF sacral wound;  Surgeon: Loel Lofty Dillingham, DO;  Location: [facility];  Service: Plastics;  Laterality: N/A;   APPLICATION OF WOUND VAC N/A 02/10/2016   Procedure: APPLICATION OF WOUND VAC possible;  Surgeon: Loel Lofty Dillingham, DO;  Location: [facility];  Service: Plastics;  Laterality: N/A;   COLONOSCOPY     I & D EXTREMITY N/A 01/30/2016   Procedure: IRRIGATION AND DEBRIDEMENT OF THE SACRUM;  Surgeon: Loel Lofty Dillingham, DO;  Location: [facility];  Service: Plastics;  Laterality: N/A;   INCISION AND DRAINAGE OF WOUND N/A 02/10/2016   Procedure: IRRIGATION AND DEBRIDEMENT sacral WOUND;  Surgeon: Loel Lofty Dillingham, DO;  Location: [facility];  Service: Plastics;  Laterality: N/A;   IR GENERIC HISTORICAL  02/11/2016   IR CATHETER TUBE CHANGE 02/11/2016 Aletta Edouard, MD [facility]INTER[facility]   IR GENERIC HISTORICAL  03/16/2016   IR CATHETER TUBE CHANGE 03/16/2016 Sandi Mariscal, MD [facility]INTERV RAD   IR GENERIC HISTORICAL  05/11/2016   IR CATHETER TUBE CHANGE 05/11/2016 Aletta Edouard, MD [facility]   POLYPECTOMY     SACRO-ILIAC PINNING N/A 01/16/2016   Procedure: Jenetta Downer;  Surgeon: Altamese [place], MD;  Location: [facility];  Service: Orthopedics;  Laterality: N/A;   SUPRAPUBIC CATHETER INSERTION     s/p removal  2018   TONSILLECTOMY     ULNAR NERVE TRANSPOSITION Right    Social History   Occupational History   Not on file  Tobacco Use   Smoking status: Never   Smokeless tobacco: Never  Substance and Sexual Activity   Alcohol use: No   Drug use: No   Sexual activity: Not Currently

## 2020-09-26 DIAGNOSIS — E11621 Type 2 diabetes mellitus with foot ulcer: Secondary | ICD-10-CM | POA: Diagnosis not present

## 2020-09-26 DIAGNOSIS — L03115 Cellulitis of right lower limb: Secondary | ICD-10-CM | POA: Diagnosis not present

## 2020-09-26 DIAGNOSIS — I10 Essential (primary) hypertension: Secondary | ICD-10-CM | POA: Diagnosis not present

## 2020-09-26 DIAGNOSIS — M86172 Other acute osteomyelitis, left ankle and foot: Secondary | ICD-10-CM | POA: Diagnosis not present

## 2020-09-27 ENCOUNTER — Telehealth: Payer: Self-pay

## 2020-09-27 MED ORDER — AMLODIPINE BESYLATE 5 MG PO TABS: 5.0000 mg | ORAL_TABLET | Freq: Every day | ORAL | 2 refills | Status: DC

## 2020-09-27 MED ORDER — ONETOUCH ULTRA VI STRP: ORAL_STRIP | 0 refills | Status: DC

## 2020-09-27 MED ORDER — RELION PEN NEEDLES 31G X 8 MM MISC: 3 refills | Status: DC

## 2020-09-27 MED ORDER — METFORMIN HCL 1000 MG PO TABS: 1000.0000 mg | ORAL_TABLET | Freq: Two times a day (BID) | ORAL | 3 refills | Status: DC

## 2020-09-27 NOTE — Patient Instructions (Signed)
Dear Christian [name],  Below is a summary of the goals we discussed during our follow up appointment on September 24, 2020. Please contact me anytime with questions or concerns.   Visit Information  Patient Care Plan: CCM Pharmacy Care Plan     Problem Identified: [facility] AMB "PATIENT-SPECIFIC PROBLEM"      Long-Range Goal: Disease Management   Start Date: 07/18/2020  Priority: High  Note:   Current Barriers:  Unable to achieve control of blood pressure and blood sugar  Pharmacist Clinical Goal(s):  Patient will adhere to plan to optimize therapeutic regimen for blood pressure as evidenced by report of adherence to recommended medication management changes through collaboration with PharmD and provider.   Interventions: 1:1 collaboration with Tonia Ghent, MD regarding development and update of comprehensive plan of care as evidenced by provider attestation and co-signature Inter-disciplinary care team collaboration (see longitudinal plan of care) Comprehensive medication review performed; medication list updated in electronic medical record  Hypertension (BP goal <140/90) -Uncontrolled - clinic readings within goal, but home readings elevated  -Pt saw nurse on 6/8 and reviewed BP monitoring technique. He has started using it correctly since then. His amlodipine was increased from 2.5 mg to 5 mg 09/11/20.  Denies any swelling, dizziness of lightheadedness. -Current treatment: Amlodipine 5 mg - 1 tablet daily Lisinopril 30 mg - 1 tablets daily -Medications previously tried: none -Patient confirms daily adherence  -Uses an arm cuff for at home monitoring -Current home readings: checks daily in morning before coffee or medications  09/19/20 Thursday - 168/94, 86  - BG 188 (7 AM)  09/20/20 Friday - 179/100, 81  BG 167 (7:30 AM) Saturday - 164/97  BG 153 (7 AM) Sunday - 147/84, 98  Sugar 205 (8:45 AM) Monday  - 158/90, Fasting Sugar 163 Tuesday 185/98, HR 88, Fasting Sugar 200 (reports he had  pimento cheese sandwich, doritos, tea for dinner the night before) -Current dietary habits: trying to limit salt, low sodium diet was reviewed by nurse 09/11/20. -Takes morning meds at breakfast, night meds at 8:45 PM -He avoids frequent use of NSAIDs. -Denies hypotensive/hypertensive symptoms -Continue to monitor BP at home daily, document, and provide log at future appointments -Recommended continue current medications; Consult PCP - consider additional therapy.  Hyperlipidemia: (LDL goal < 100) -Controlled - LDL 19 -Current treatent: Atorvastatin 80 mg - 1 daily -Refills timely, last filled 04/16/20 90 DS - DUE  -Patient reports daily adherence to 1 tablet atorvastatin 80 mg. His current bottle is dated 04/16/20 and is about half full. No clear reason for excess supply.  -Recommended to continue current medication; Recommend adherence packaging.   Diabetes (A1c goal <7%) -Uncontrolled  -A1c 9.1% -Pt reports he reduced the metformin to 1/2 BID (500 mg BID) since sugars were better. Unsure when. Last visit reported taking 1000 mg BID. He states PCP aware. Pt reports taking 34 units Levemir at night.  -Current medications: Levemir - Inject 34 units daily Metformin 1000 mg - 1 tablet twice daily (patient reports taking 1/2 (500 mg) BID) Glipizide 5 mg - 2 in the morning and 1 at night  -Medications previously tried: none reported -Current home glucose readings - last 7 days below, fasting fasting glucose: 188,167, 205, 153, 163, 200 -Recommended resume higher dose metformin - 1000 mg twice daily. Call if any upset stomach.  Patient Goals/Self-Care Activities Patient will:  - take medications as prescribed check glucose daily, document, and provide at future appointments check blood pressure daily, document,  and provide at future appointments  Follow Up Plan: Telephone appointment scheduled for: 30 days       Patient verbalizes understanding of instructions provided today and agrees to  view in [place].  The pharmacy team will reach out to the patient again over the next 30 days.   Verbal consent obtained for [facility] enhanced pharmacy services (medication synchronization, adherence packaging, delivery coordination). A medication sync plan was created to allow patient to get all medications delivered once every 30 to 90 days per patient preference. Patient understands they have freedom to choose pharmacy and clinical pharmacist will coordinate care between all prescribers and [facility].  Debbora Dus, PharmD Clinical Pharmacist [facility] at [facility] [phone]

## 2020-09-27 NOTE — Addendum Note (Signed)
Addended by: Sherrilee Gilles B on: 09/27/2020 05:07 PM   Modules accepted: Orders

## 2020-09-27 NOTE — Telephone Encounter (Addendum)
Patient needs new prescription for amlodipine 5 mg daily. He has been taking two of the 2.5 mg since 09/11/20 and starting to run low. Please send to [facility].  Also requesting refills on metformin 1000 mg BID, insulin pen needles and test strips to [facility].  Thanks,  Debbora Dus, PharmD Clinical Pharmacist [facility] at [facility] [phone]

## 2020-09-27 NOTE — Telephone Encounter (Signed)
Patient's BP is still running high. He is on lisinopril 30 mg daily and amlodipine 5 mg daily.  He denies any dizziness with recent amlodipine dose increase to 5 mg 09/11/20. His monitor was verified accurate with Larene [place] last month. I think he may need a diuretic, thoughts? He is using [facility] now.  09/19/20 Thursday - 168/94, 86   09/20/20 Friday - 179/100, 81   09/21/20 Saturday - 164/97   09/22/20 Sunday - 147/84, 98  09/23/20 Monday  - 158/90 09/24/20 Tuesday 185/98, HR 88   No Known Allergies  Lab Results  Component Value Date   CREATININE 1.10 08/30/2020   BUN 17 08/30/2020   GFR 66.20 08/08/2020   GFRNONAA >60 08/22/2020   GFRAA >60 05/17/2016   NA 143 08/30/2020   K 4.8 08/30/2020   CALCIUM 9.1 08/30/2020   CO2 24 08/30/2020    Debbora Dus, PharmD Clinical Pharmacist [facility] at [place] [phone]

## 2020-09-27 NOTE — Telephone Encounter (Signed)
Rxs sent

## 2020-09-29 MED ORDER — HYDROCHLOROTHIAZIDE 12.5 MG PO TABS: 12.5000 mg | ORAL_TABLET | Freq: Every day | ORAL | 3 refills | Status: DC

## 2020-09-29 NOTE — Telephone Encounter (Signed)
I would add on 12.5mg  HCTZ.  I sent the rx.  Please have patient update [place] in about 10 days with his BP readings.  Thanks.

## 2020-09-30 NOTE — Chronic Care Management (AMB) (Addendum)
Contacted patient to advise him of medication change. He is aware hydrochlorothiazide will be delivered by [facility]. He will contact CCM team with any concerns or dizziness. Aware someone from the CCM team will call him in about 10 days to review updated BP log.   Follow-Up:  Pharmacist Review  Debbora Dus, CPP notified  [name], [place] [facility] Assistant [phone]

## 2020-10-15 ENCOUNTER — Telehealth: Payer: Self-pay

## 2020-10-15 NOTE — Chronic Care Management (AMB) (Addendum)
Chronic Care Management Pharmacy Assistant   Name: Christian [name]  MRN: [id number] DOB: 1941-01-14  Reason for Encounter: Medication Adherence and Delivery Coordination   Recent office visits:  None since last CCM contact  Recent consult visits:  09/25/20 - Orthopedic surgery started short course doxycycline 100mg  take 1 tablet 2 times daily  Hospital visits:  08/18/20 thru 08/22/20 [facility]  cellulitis foot infection  Medications: Outpatient Encounter Medications as of 10/15/2020  Medication Sig   amLODipine (NORVASC) 5 MG tablet Take 1 tablet (5 mg total) by mouth daily.   atorvastatin (LIPITOR) 80 MG tablet Take 1 tablet (80 mg total) by mouth at bedtime.   doxycycline (VIBRA-TABS) 100 MG tablet Take 1 tablet (100 mg total) by mouth 2 (two) times daily.   glipiZIDE (GLUCOTROL) 5 MG tablet Take 2 tablets by mouth in the morning and 1 tablet by mouth at night.   glucose blood (ONETOUCH ULTRA) test strip USE 1 STRIP TO CHECK GLUCOSE ONCE DAILY   hydrochlorothiazide (HYDRODIURIL) 12.5 MG tablet Take 1 tablet (12.5 mg total) by mouth daily.   insulin detemir (LEVEMIR FLEXTOUCH) 100 UNIT/ML FlexPen INJECT UP TO 20-36 UNITS SUBCUTANEOUSLY ONCE DAILY   Insulin Pen Needle (RELION PEN NEEDLE 31G/8MM) 31G X 8 MM MISC USE TO INJECT LEVEMIR DAILY   lisinopril (ZESTRIL) 30 MG tablet Take 1 tablet (30 mg total) by mouth daily.   metFORMIN (GLUCOPHAGE) 1000 MG tablet Take 1 tablet (1,000 mg total) by mouth 2 (two) times daily with a meal.   No facility-administered encounter medications on file as of 10/15/2020.   BP Readings from Last 3 Encounters:  08/30/20 140/68  08/22/20 (!) 150/77  08/08/20 126/80    Lab Results  Component Value Date   HGBA1C 9.1 (H) 08/08/2020     Patient obtains medications through Adherence Packaging  30 Days  Patient is due for next adherence delivery on: 10/23/2020  Spoke with patient on 10/15/2020 reviewed medications and coordinated delivery.  This  delivery to include: Vials  30 Days  Vials Amlodipine 5 mg take 1 tablet at breakfast Prevagen regular strength take 1 tablet at breakfast  Patient declined the following medications this month: Glipizide 5mg  take 2 tablets breakfast take 1 tablet evening meal Lisinopril 30mg  take1 tablet evening meal Metformin 1000mg  take 1 tablet at breakfast 1 tablet evening meal Atorvastatin 80mg  take 1 tablet evening meal Trueplus Pen Needles (patient has supply) Levemir Flextouch (patient has supply) Onetouch ulrtra test strips (patient has supply)  The patient informs me that he has 45 + days of above medications on hand. He would like to schedule in person visit to review his meds on hand and create sync plan.  No refill request needed from PCP.  Confirmed delivery date of 10/23/2020, advised patient that pharmacy will contact him the morning of delivery.  Recent blood pressure readings are as follows: 10/11/20 146/80   75-P 10/12/20 143/82   80-P 10/13/20 142/90   76-p 10/14/20 147/90   76-P 10/15/20 143/84   80-P   Recent blood glucose readings are as follows: Fasting: 10/11/20 -125   10/12/20 -130   10/13/20 -106   10/14/20 -111   10/15/20 -114   Follow-Up:  Coordination of Enhanced Pharmacy Services and Pharmacist Review  Debbora Dus, CPP notified  Avel Sensor, [place] Assistant [phone]  I have reviewed the care management and care coordination activities outlined in this encounter and I am certifying that I agree with the content of  this note. Schedule F2F CCM visit.  Debbora Dus, PharmD Clinical Pharmacist [facility] at [facility] [phone]

## 2020-10-16 ENCOUNTER — Encounter: Payer: Self-pay | Admitting: Physician Assistant

## 2020-10-16 ENCOUNTER — Ambulatory Visit: Payer: Medicare Other | Admitting: Physician Assistant

## 2020-10-16 DIAGNOSIS — M869 Osteomyelitis, unspecified: Secondary | ICD-10-CM

## 2020-10-16 NOTE — Progress Notes (Signed)
Office Visit Note   Patient: Christian [name]           Date of Birth: [birth year]           MRN: [id number] Visit Date: 10/16/2020              Requested by: Tonia Ghent, MD [place],  [place] [address] PCP: Tonia Ghent, MD  No chief complaint on file.     HPI: Patient is a pleasant gentleman that is 7 weeks status post right fifth ray amputation.  He had a persistent area that has a little bit of drainage at the end of wound on some doxycycline he reports he is doing well today.  Gets just a spot of drainage occasionally on his sock  Assessment & Plan: Visit Diagnoses: No diagnosis found.  Plan: We will follow-up in 1 month if all the drainage has ceased he may cancel that appointment.  I have also instructed him to try to do as much stretching in his ankle as he can as he has a small callus beneath the first MTP joint that is starting to form  Follow-Up Instructions: No follow-ups on file.   Ortho Exam  Patient is alert, oriented, no adenopathy, well-dressed, normal affect, normal respiratory effort. Examination of his foot he does have some eschar along the incision but overall well-healed 1 distal pinpoint area of drainage.  She just a spot amount cannot probe deeply.  No fluctuance no surrounding cellulitis he has a small callus on the plantar surface of the MTP joint medially.  There is no surrounding cellulitis he has stiffness in the ankle and with leg extended comes to just neutral no signs of infection  Imaging: No results found. No images are attached to the encounter.  Labs: Lab Results  Component Value Date   HGBA1C 9.1 (H) 08/08/2020   HGBA1C 9.5 (A) 04/16/2020   HGBA1C 9.8 (H) 01/04/2020   ESRSEDRATE 35 (H) 08/08/2020   LABURIC 5.0 08/19/2020   REPTSTATUS 05/20/2016 FINAL 05/17/2016   CULT >=100,000 COLONIES/mL ESCHERICHIA COLI (A) 05/17/2016   LABORGA ESCHERICHIA COLI (A) 05/17/2016     Lab Results  Component Value Date    ALBUMIN 3.3 (L) 08/18/2020   ALBUMIN 4.2 01/04/2020   ALBUMIN 4.3 03/01/2018    Lab Results  Component Value Date   MG 1.4 (L) 03/06/2016   No results found for: VD25OH  No results found for: PREALBUMIN CBC EXTENDED Latest Ref Rng & Units 08/30/2020 08/22/2020 08/19/2020  WBC 3.8 - 10.8 Thousand/uL 9.2 8.7 7.2  RBC 4.20 - 5.80 Million/uL 4.22 4.04(L) 3.67(L)  HGB 13.2 - 17.1 g/dL 12.4(L) 11.8(L) 10.7(L)  HCT 38.5 - 50.0 % 38.0(L) 36.6(L) 33.6(L)  PLT 140 - 400 Thousand/uL 222 232 185  NEUTROABS 1,500 - 7,800 cells/uL 5,492 - -  LYMPHSABS 850 - 3,900 cells/uL 2,778 - -     There is no height or weight on file to calculate BMI.  Orders:  No orders of the defined types were placed in this encounter.  No orders of the defined types were placed in this encounter.    Procedures: No procedures performed  Clinical Data: No additional findings.  ROS:  All other systems negative, except as noted in the HPI. Review of Systems  Objective: Vital Signs: There were no vitals taken for this visit.  Specialty Comments:  No specialty comments available.  PMFS History: Patient Active Problem List   Diagnosis Date Noted  Osteomyelitis of fifth toe of right foot ([place])    Diabetic foot infection ([place]) 08/18/2020   Diabetic foot ulcer ([place]) 08/11/2020   Healthcare maintenance 01/10/2020   Lower back pain 09/27/2019   Thumb pain 06/25/2019   Medicare annual wellness visit, subsequent 03/12/2018   Advance care planning 03/12/2018   Shoulder pain 02/03/2017   Edema 11/02/2016   Diabetes mellitus with microalbuminuria ([place]) 06/10/2016   HLD (hyperlipidemia) 06/10/2016   DM type 2 with diabetic peripheral neuropathy (H[facility])    HTN (hypertension)    Accident caused by farm tractor 01/15/2016   Colon polyps 06/24/2013   Past Medical History:  Diagnosis Date   Arthritis    Blood transfusion without reported diagnosis    ? in 2017 after crushing injury    Cataract    right eye  removed    Crushing injury of pelvic region    Diabetes mellitus without complication ([place])    Hyperlipidemia    Hypertension     Family History  Problem Relation Age of Onset   Lymphoma Mother    CVA Father    Dementia Father    Diabetes Neg Hx    Prostate cancer Neg Hx    Colon polyps Neg Hx    Colon cancer Neg Hx    Rectal cancer Neg Hx    Stomach cancer Neg Hx    Esophageal cancer Neg Hx     Past Surgical History:  Procedure Laterality Date   ACHILLES TENDON SURGERY Right    AMPUTATION Right 08/21/2020   Procedure: RIGHT FOOT 5TH RAY AMPUTATION;  Surgeon: Newt Minion, MD;  Location: [facility];  Service: Orthopedics;  Laterality: Right;   APPLICATION OF A-CELL OF BACK N/A 02/10/2016   Procedure: APPLICATION OF A-CELL OF sacral wound;  Surgeon: Loel Lofty Dillingham, DO;  Location: [facility];  Service: Plastics;  Laterality: N/A;   APPLICATION OF WOUND VAC N/A 02/10/2016   Procedure: APPLICATION OF WOUND VAC possible;  Surgeon: Loel Lofty Dillingham, DO;  Location: [facility];  Service: Plastics;  Laterality: N/A;   COLONOSCOPY     I & D EXTREMITY N/A 01/30/2016   Procedure: IRRIGATION AND DEBRIDEMENT OF THE SACRUM;  Surgeon: Loel Lofty Dillingham, DO;  Location: [facility];  Service: Plastics;  Laterality: N/A;   INCISION AND DRAINAGE OF WOUND N/A 02/10/2016   Procedure: IRRIGATION AND DEBRIDEMENT sacral WOUND;  Surgeon: Loel Lofty Dillingham, DO;  Location: [facility];  Service: Plastics;  Laterality: N/A;   IR GENERIC HISTORICAL  02/11/2016   IR CATHETER TUBE CHANGE 02/11/2016 Aletta Edouard, MD [facility]   IR GENERIC HISTORICAL  03/16/2016   IR CATHETER TUBE CHANGE 03/16/2016 Sandi Mariscal, MD [facility]   IR GENERIC HISTORICAL  05/11/2016   IR CATHETER TUBE CHANGE 05/11/2016 Aletta Edouard, MD [facility]   POLYPECTOMY     SACRO-ILIAC PINNING N/A 01/16/2016   Procedure: Jenetta Downer;  Surgeon: Altamese Sabin, MD;  Location: [facility];  Service: Orthopedics;  Laterality: N/A;   SUPRAPUBIC  CATHETER INSERTION     s/p removal 2018   TONSILLECTOMY     ULNAR NERVE TRANSPOSITION Right    Social History   Occupational History   Not on file  Tobacco Use   Smoking status: Never   Smokeless tobacco: Never  Substance and Sexual Activity   Alcohol use: No   Drug use: No   Sexual activity: Not Currently

## 2020-10-17 NOTE — Chronic Care Management (AMB) (Signed)
Contacted patient to reschedule face to face visit with Debbora Dus. Patient was rescheduled to in office visit with Debbora Dus on 10/22/20 at 10:15. Patient was reminded to have all medications, supplements and any blood glucose and blood pressure readings available for review at appointment.   Debbora Dus, CPP notified  [name], [place] Pharmacy Assistant [phone]

## 2020-10-22 ENCOUNTER — Ambulatory Visit (INDEPENDENT_AMBULATORY_CARE_PROVIDER_SITE_OTHER): Payer: Medicare Other

## 2020-10-22 ENCOUNTER — Other Ambulatory Visit: Payer: Self-pay

## 2020-10-22 DIAGNOSIS — I10 Essential (primary) hypertension: Secondary | ICD-10-CM

## 2020-10-22 DIAGNOSIS — E785 Hyperlipidemia, unspecified: Secondary | ICD-10-CM | POA: Diagnosis not present

## 2020-10-22 DIAGNOSIS — E1142 Type 2 diabetes mellitus with diabetic polyneuropathy: Secondary | ICD-10-CM

## 2020-10-22 NOTE — Progress Notes (Signed)
/  Chronic Care Management  Pharmacy Note  10/22/2020 Name:  Christian [name] MRN:  [id number] DOB:  06-Oct-1940  Subjective: Christian [name] is an 80 y.o. year old male who is a primary patient of [name], [name], MD.  The CCM team was consulted for assistance with disease management and care coordination needs.    Engaged with patient face to face for follow up visit in response to provider referral for pharmacy case management   Consent to Services:  The patient was given information about Chronic Care Management services, agreed to services, and gave verbal consent prior to initiation of services.  Please see initial visit note for detailed documentation.   Patient Care Team: Tonia Ghent, MD as PCP - General (Family Medicine) Debbora Dus, [facility] as Pharmacist (Pharmacist)  Recent office visits: 09/30/20 - Telephone note - Elevated BP, start HCTZ.  Recent consult visits: 10/16/20 - Orthopedics - Osteomyelitis, presented for 7 week follow up status post 5th toe amputation  Hospital visits: 07/27/20 - ED/Urgent care - Diabetic foot ulcer  Objective:  Lab Results  Component Value Date   CREATININE 1.10 08/30/2020   BUN 17 08/30/2020   GFR 66.20 08/08/2020   GFRNONAA >60 08/22/2020   GFRAA >60 05/17/2016   NA 143 08/30/2020   K 4.8 08/30/2020   CALCIUM 9.1 08/30/2020   CO2 24 08/30/2020   GLUCOSE 264 (H) 08/30/2020    Lab Results  Component Value Date/Time   HGBA1C 9.1 (H) 08/08/2020 10:03 AM   HGBA1C 9.5 (A) 04/16/2020 09:27 AM   HGBA1C 9.8 (H) 01/04/2020 08:02 AM   GFR 66.20 08/08/2020 10:03 AM   GFR 49.73 (L) 05/09/2020 11:23 AM   MICROALBUR 1.6 01/04/2020 08:02 AM   MICROALBUR 0.7 03/07/2018 09:41 AM    Last diabetic Eye exam:  Lab Results  Component Value Date/Time   HMDIABEYEEXA No Retinopathy 08/30/2020 12:00 AM    Last diabetic Foot exam: 08/08/20  PCP diabetic foot ulcer  Lab Results  Component Value Date   CHOL 85 01/04/2020   HDL 27.70 (L)  01/04/2020   LDLDIRECT 19.0 01/04/2020   TRIG 391.0 (H) 01/04/2020   CHOLHDL 3 01/04/2020    Hepatic Function Latest Ref Rng & Units 08/18/2020 01/04/2020 03/01/2018  Total Protein 6.5 - 8.1 g/dL 6.7 7.3 7.3  Albumin 3.5 - 5.0 g/dL 3.3(L) 4.2 4.3  AST 15 - 41 U/L _0 ALT 0 - 44 U/L _1 Alk Phosphatase 38 - 126 U/L 55 64 74  Total Bilirubin 0.3 - 1.2 mg/dL 0.9 0.9 0.8    Lab Results  Component Value Date/Time   TSH 3.337 03/06/2016 03:45 AM    CBC Latest Ref Rng & Units 08/30/2020 08/22/2020 08/19/2020  WBC 3.8 - 10.8 Thousand/uL 9.2 8.7 7.2  Hemoglobin 13.2 - 17.1 g/dL 12.4(L) 11.8(L) 10.7(L)  Hematocrit 38.5 - 50.0 % 38.0(L) 36.6(L) 33.6(L)  Platelets 140 - 400 Thousand/uL 222 232 185   Clinical ASCVD: No  The ASCVD Risk score Mikey Bussing DC Jr., et al., 2013) failed to calculate for the following reasons:   The valid total cholesterol range is 130 to 320 mg/dL    Depression screen [facility] 2/9 04/16/2020 03/08/2018 06/03/2016  Decreased Interest 0 0 0  Down, Depressed, Hopeless 0 0 0  PHQ - 2 Score 0 0 0    Social History   Tobacco Use  Smoking Status Never  Smokeless Tobacco Never   BP Readings from Last 3 Encounters:  11/07/20 120/64  08/30/20 140/68  08/22/20 (!) 150/77   Pulse Readings from Last 3 Encounters:  11/07/20 (!) 57  08/30/20 (!) 104  08/22/20 62   Wt Readings from Last 3 Encounters:  11/07/20 219 lb 8 oz (99.6 kg)  08/30/20 217 lb (98.4 kg)  08/08/20 217 lb (98.4 kg)   BMI Readings from Last 3 Encounters:  11/07/20 28.18 kg/m  08/30/20 27.86 kg/m  08/08/20 27.86 kg/m    Assessment/Interventions: Review of patient past medical history, allergies, medications, health status, including review of consultants reports, laboratory and other test data, was performed as part of comprehensive evaluation and provision of chronic care management services.   SDOH:  (Social Determinants of Health) assessments and interventions performed: Yes - addresses at  previous visit  SDOH Screenings   Alcohol Screen: Not on file  Depression (PHQ2-9): Low Risk    PHQ-2 Score: 0  Financial Resource Strain: Low Risk    Difficulty of Paying Living Expenses: Not very hard  Food Insecurity: Not on file  Housing: Not on file  Physical Activity: Not on file  Social Connections: Not on file  Stress: Not on file  Tobacco Use: Low Risk    Smoking Tobacco Use: Never   Smokeless Tobacco Use: Never  Transportation Needs: Not on file    [place]  No Known Allergies  Medications Reviewed Today     Reviewed by Debbora Dus, [facility] (Pharmacist) on 11/11/20 at [place] List Status: <None>   Medication Order Taking? Sig Documenting Provider Last Dose Status Informant  amLODipine (NORVASC) 5 MG tablet [id number]  Take 1 tablet (5 mg total) by mouth daily. Tonia Ghent, MD  Active   atorvastatin (LIPITOR) 80 MG tablet [id number] No Take 1 tablet (80 mg total) by mouth at bedtime. Tonia Ghent, MD Taking Active Self  glipiZIDE (GLUCOTROL) 5 MG tablet [id number] No Take 2 tablets by mouth in the morning and 1 tablet by mouth at night. Tonia Ghent, MD Taking Active Self  glucose blood [facility] ULTRA) test strip [id number]  USE 1 STRIP TO CHECK GLUCOSE ONCE DAILY Tonia Ghent, MD  Active   hydrochlorothiazide (HYDRODIURIL) 12.5 MG tablet [id number]  Take 1 tablet (12.5 mg total) by mouth daily. Tonia Ghent, MD  Active   insulin detemir [facility]) 100 UNIT/ML FlexPen [id number] No INJECT UP TO 20-36 UNITS SUBCUTANEOUSLY ONCE DAILY Tonia Ghent, MD Taking Active   Insulin Pen Needle (RELION PEN NEEDLE 31G/8MM) 31G X 8 MM MISC [id number]  USE TO INJECT LEVEMIR DAILY Tonia Ghent, MD  Active   lisinopril (ZESTRIL) 30 MG tablet [id number] No Take 1 tablet (30 mg total) by mouth daily. Tonia Ghent, MD Taking Active Self  metFORMIN (GLUCOPHAGE) 1000 MG tablet [id number]  Take 1 tablet (1,000 mg total) by mouth 2 (two) times daily with  a meal.  Patient taking differently: Take 500 mg by mouth daily with breakfast.   Tonia Ghent, MD  Active             Patient Active Problem List   Diagnosis Date Noted   Osteomyelitis of fifth toe of right foot ([place])    Diabetic foot infection ([place]) 08/18/2020   Diabetic foot ulcer ([place]) 08/11/2020   Healthcare maintenance 01/10/2020   Lower back pain 09/27/2019   Thumb pain 06/25/2019   Medicare annual wellness visit, subsequent 03/12/2018   Advance care planning 03/12/2018   Shoulder pain 02/03/2017   Edema 11/02/2016  Diabetes mellitus with microalbuminuria ([place]) 06/10/2016   HLD (hyperlipidemia) 06/10/2016   DM type 2 with diabetic peripheral neuropathy ([place])    HTN (hypertension)    Accident caused by farm tractor 01/15/2016   Colon polyps 06/24/2013    Immunization History  Administered Date(s) Administered   Fluad Quad(high Dose 65+) 01/05/2019, 01/09/2020   Influenza,inj,Quad PF,6+ Mos 02/20/2013, 01/03/2014, 02/05/2015, 01/15/2016, 03/26/2016, 02/02/2017   Influenza-Unspecified 04/13/2010, 01/11/2018   PFIZER(Purple Top)SARS-COV-2 Vaccination 05/23/2019, 06/17/2019   Pneumococcal Conjugate-13 01/06/2012, 01/03/2014   Pneumococcal Polysaccharide-23 12/19/2012   Td 05/14/2014   Zoster Recombinat (Shingrix) 10/27/2018, 01/31/2019   Zoster, Live 12/19/2014    Conditions to be addressed/monitored:  Hypertension, Hyperlipidemia and Diabetes  Care Plan : [place]  Updates made by Debbora Dus, [place] since 11/11/2020 12:00 AM     Problem: CHL AMB "PATIENT-SPECIFIC PROBLEM"      Long-Range Goal: Disease Management   Start Date: 07/18/2020  Priority: High  Note:    Current Barriers: Unable to achieve control of blood pressure and blood sugar   Pharmacist Clinical Goal(s):  Patient will adhere to plan to optimize therapeutic regimen for blood pressure as evidenced by report of adherence to recommended medication management changes through  collaboration with PharmD and provider.   Interventions: 1:1 collaboration with Tonia Ghent, MD regarding development and update of comprehensive plan of care as evidenced by provider attestation and co-signature Inter-disciplinary care team collaboration (see longitudinal plan of care) Comprehensive medication review performed; medication list updated in electronic medical record   Hypertension (BP goal <140/90) -Controlled - home readings improved, within goal -Pt saw nurse on 6/8 and reviewed BP monitoring technique. He has started using it correctly since then. His amlodipine was increased from 2.5 mg to 5 mg 09/11/20. No adverse effects noted with amlodipine -Current treatment: Amlodipine 5 mg - 1 tablet daily Lisinopril 30 mg - 1 tablets daily -Medications previously tried: none -Patient confirms daily adherence -Uses an arm cuff for at home monitoring -Current home readings: checks daily in morning before coffee or medications July 1-12, 2022 --> ranging 116-146/73-94 131/79 today  -Current dietary habits: trying to limit salt, low sodium diet was reviewed by nurse 09/11/20. -Takes morning meds at breakfast, night meds at 8:45 PM -He avoids frequent use of NSAIDs. -Denies hypotensive/hypertensive symptoms -Continue to monitor BP at home daily, document, and provide log at future appointments -Recommended continue current medications   Hyperlipidemia: (LDL goal < 100) -Controlled - LDL 19 -Current treatent: Atorvastatin 80 mg - 1 daily -Patient affirms daily adherence. He will start pill packs 11/10/20.  -Recommended to continue current medication   Diabetes (A1c goal <7%) -Uncontrolled  -A1c 9.1% (07/2020) -Pt reports he had to stop metformin due to fecal incontinence. He has been off for 2 days and this has resolved. Reports he was on 1/2 tab BID prior to this (1000 mg tabs) -Current medications: Levemir - Inject 34 units daily Glipizide 5 mg - 2 in the morning and 1 at  night (pt taking differently: 2 BID) -Medications previously tried: none -Current home glucose readings - last 7 days below, fasting fasting glucose: 125, 130, 106, 111, 117, 137, 137, 150, 176, 141, 155, 187 (without metformin, had 2 cookies last night) Denies any hypoglycemia on the higher glipizide dose  -Recommended continue off metformin. We will watch fasting BG and adjust insulin as needed.   Patient Goals/Self-Care Activities Patient will: - take medications as prescribed check glucose daily, document, and provide at future  appointments check blood pressure daily, document, and provide at future appointments   Follow Up Plan: Telephone appointment scheduled for: 30 days     Medication Assistance: None required.  Patient affirms current coverage meets needs.   Star Rating Drugs:  Medication:                Last Fill:         Day Supply Med sync in process   Patient's preferred pharmacy is: [place], [place] - [address] [place] [address] Phone: [phone] Fax: [phone]  We reviewed all his medications, indications, and directions. Counted pills on hand. Resynced meds. He requests the following medications in adherence packaging. His estimated sync and pack date is: 11/10/20  Packs:  Amlodipine 2.5 mg - 2 tabs at breakfast Atorvastatin 80 mg - 1 evening meal  Glipizide 5 mg - 2 breakfast, 1 evening meal Lisinopril 30 mg - 1 evening meal  Prevagen Regular Strength - 1 at breakfast  Care Plan and Follow Up Patient Decision:  Patient agrees to Care Plan and Follow-up.  Debbora Dus, PharmD Clinical Pharmacist [place] [facility] at [facility] [phone]

## 2020-10-24 DIAGNOSIS — D225 Melanocytic nevi of trunk: Secondary | ICD-10-CM | POA: Diagnosis not present

## 2020-10-24 DIAGNOSIS — L905 Scar conditions and fibrosis of skin: Secondary | ICD-10-CM | POA: Diagnosis not present

## 2020-10-24 DIAGNOSIS — L570 Actinic keratosis: Secondary | ICD-10-CM | POA: Diagnosis not present

## 2020-10-24 DIAGNOSIS — L814 Other melanin hyperpigmentation: Secondary | ICD-10-CM | POA: Diagnosis not present

## 2020-10-24 DIAGNOSIS — Z85828 Personal history of other malignant neoplasm of skin: Secondary | ICD-10-CM | POA: Diagnosis not present

## 2020-10-24 DIAGNOSIS — L821 Other seborrheic keratosis: Secondary | ICD-10-CM | POA: Diagnosis not present

## 2020-11-01 ENCOUNTER — Ambulatory Visit: Payer: Medicare Other | Admitting: Podiatry

## 2020-11-01 ENCOUNTER — Other Ambulatory Visit: Payer: Self-pay

## 2020-11-01 DIAGNOSIS — B351 Tinea unguium: Secondary | ICD-10-CM

## 2020-11-01 DIAGNOSIS — E1142 Type 2 diabetes mellitus with diabetic polyneuropathy: Secondary | ICD-10-CM

## 2020-11-01 DIAGNOSIS — E1169 Type 2 diabetes mellitus with other specified complication: Secondary | ICD-10-CM | POA: Diagnosis not present

## 2020-11-06 ENCOUNTER — Other Ambulatory Visit: Payer: Self-pay

## 2020-11-07 ENCOUNTER — Ambulatory Visit (INDEPENDENT_AMBULATORY_CARE_PROVIDER_SITE_OTHER): Payer: Medicare Other | Admitting: Family Medicine

## 2020-11-07 ENCOUNTER — Encounter: Payer: Self-pay | Admitting: Family Medicine

## 2020-11-07 VITALS — BP 120/64 | HR 57 | Temp 97.7°F | Ht 74.0 in | Wt 219.5 lb

## 2020-11-07 DIAGNOSIS — M19011 Primary osteoarthritis, right shoulder: Secondary | ICD-10-CM | POA: Diagnosis not present

## 2020-11-07 MED ORDER — TRIAMCINOLONE ACETONIDE 40 MG/ML IJ SUSP
40.0000 mg | Freq: Once | INTRAMUSCULAR | Status: AC
  Administered 2020-11-07: 40 mg via INTRA_ARTICULAR

## 2020-11-07 NOTE — Progress Notes (Signed)
    Samaiya Awadallah T. Gilma Bessette, MD, [place] at [facility] [place], [address]  Phone: [phone]  FAX: [phone]  Christian [name] - 80 y.o. male  MRN [id number]  Date of Birth: 01-28-1941  Date: 11/07/2020  PCP: Tonia Ghent, MD  Referral: Tonia Ghent, MD  Chief Complaint  Patient presents with   Shoulder Pain    Right-Injection    This visit occurred during the SARS-CoV-2 public health emergency.  Safety protocols were in place, including screening questions prior to the visit, additional usage of staff PPE, and extensive cleaning of exam room while observing appropriate contact time as indicated for disinfecting solutions.    3 years ago, R GH joint injection He has done well, but he does have resurgence of pain and continues to have some mild loss of motion.  Intraarticular Shoulder Aspiration/Injection Procedure Note Christian [name] 06-24-1940 Date of procedure: 11/07/2020  Procedure: Large Joint Aspiration / Injection of Shoulder, Intraarticular, R Indications: Pain  Procedure Details Verbal consent was obtained from the patient. Risks explained and contrasted with benefits and alternatives. Patient prepped with Chloraprep and Ethyl Chloride used for anesthesia. An intraarticular shoulder injection was performed using the posterior approach; needle placed into joint capsule without difficulty. The patient tolerated the procedure well and had decreased pain post injection. No complications. Injection: 9 cc of Lidocaine 1% and 1 mL Kenalog 40 mg. Needle: 21 gauge, 2 inch     ICD-10-CM   1. Osteoarthritis of glenohumeral joint, right  M19.011 triamcinolone acetonide (KENALOG-40) injection 40 mg

## 2020-11-08 NOTE — Progress Notes (Signed)
  Subjective:  Patient ID: Christian [name], male    DOB: 1940/11/30,  MRN: [id number]  No chief complaint on file.  80 y.o. male presents with the above complaint. History confirmed with patient.  Here for care of his nails.  States that after his last visit he had ulceration open up to the right fifth toe area which later became infected and he underwent partial fifth ray resection with Dr. Sharol Given  Objective:  Physical Exam: warm, good capillary refill, nail exam onychomycosis of the toenails, no trophic changes or ulcerative lesions. DP pulses palpable, PT pulses palpable, and protective sensation absent fifth ray amputation noted right   No images are attached to the encounter.  Assessment:   1. Onychomycosis of multiple toenails with type 2 diabetes mellitus and peripheral neuropathy ([place])      Plan:  Patient was evaluated and treated and all questions answered.  Onychomycosis, Diabetes, and DPN -Patient is diabetic with a qualifying condition for at risk foot care.  Procedure: Nail Debridement Type of Debridement: manual, sharp debridement. Instrumentation: Nail nipper, rotary burr. Number of Nails: 9  No follow-ups on file.

## 2020-11-10 NOTE — Patient Instructions (Addendum)
Dear [name],  Below is a summary of the goals we discussed during our follow up appointment on 10/22/20. Please contact me anytime with questions or concerns.   Visit Information  Patient Care Plan: CCM Pharmacy Care Plan     Problem Identified: CHL AMB "PATIENT-SPECIFIC PROBLEM"      Long-Range Goal: Disease Management   Start Date: 07/18/2020  Priority: High  Note:    Current Barriers: Unable to achieve control of blood pressure and blood sugar   Pharmacist Clinical Goal(s):  Patient will adhere to plan to optimize therapeutic regimen for blood pressure as evidenced by report of adherence to recommended medication management changes through collaboration with PharmD and provider.   Interventions: 1:1 collaboration with Tonia Ghent, MD regarding development and update of comprehensive plan of care as evidenced by provider attestation and co-signature Inter-disciplinary care team collaboration (see longitudinal plan of care) Comprehensive medication review performed; medication list updated in electronic medical record   Hypertension (BP goal <140/90) -Controlled - home readings improved, within goal -Pt saw nurse on 6/8 and reviewed BP monitoring technique. He has started using it correctly since then. His amlodipine was increased from 2.5 mg to 5 mg 09/11/20. No adverse effects noted with amlodipine -Current treatment: Amlodipine 5 mg - 1 tablet daily Lisinopril 30 mg - 1 tablets daily -Medications previously tried: none -Patient confirms daily adherence -Uses an arm cuff for at home monitoring -Current home readings: checks daily in morning before coffee or medications July 1-12, 2022 --> ranging 116-146/73-94 131/79 today  -Current dietary habits: trying to limit salt, low sodium diet was reviewed by nurse 09/11/20. -Takes morning meds at breakfast, night meds at 8:45 PM -He avoids frequent use of NSAIDs. -Denies hypotensive/hypertensive symptoms -Continue to  monitor BP at home daily, document, and provide log at future appointments -Recommended continue current medications   Hyperlipidemia: (LDL goal < 100) -Controlled - LDL 19 -Current treatent: Atorvastatin 80 mg - 1 daily -Patient affirms daily adherence. He will start pill packs 11/10/20.  -Recommended to continue current medication   Diabetes (A1c goal <7%) -Uncontrolled  -A1c 9.1% (07/2020) -Pt reports he had to stop metformin due to fecal incontinence. He has been off for 2 days and this has resolved. Reports he was on 1/2 tab BID prior to this (1000 mg tabs) -Current medications: Levemir - Inject 34 units daily Glipizide 5 mg - 2 in the morning and 1 at night (pt taking differently: 2 BID) -Medications previously tried: none -Current home glucose readings - last 7 days below, fasting fasting glucose: 125, 130, 106, 111, 117, 137, 137, 150, 176, 141, 155, 187 (without metformin, had 2 cookies last night) Denies any hypoglycemia on the higher glipizide dose  -Recommended continue off metformin. We will watch fasting BG and adjust insulin as needed.   Patient Goals/Self-Care Activities Patient will: - take medications as prescribed check glucose daily, document, and provide at future appointments check blood pressure daily, document, and provide at future appointments   Follow Up Plan: Telephone appointment scheduled for: 30 days      The patient verbalized understanding of instructions, educational materials, and care plan provided today and declined offer to receive copy of patient instructions, educational materials, and care plan.   Debbora Dus, PharmD Clinical Pharmacist [place] [facility] at [facility] [phone]

## 2020-11-11 ENCOUNTER — Telehealth: Payer: Self-pay

## 2020-11-11 NOTE — Telephone Encounter (Signed)
Contacted patient to ensure he received his pill packs. These should start on 11/10/20. All oral meds in packs. He confirms. Also checked on BG. He reports recent steroid injection but BG is now back down < 200. He has restarted his metformin at 500 mg/day despite severe upset stomach in the past. This is not in his packs since we stopped it at previous visit. If he is tolerating, okay to continue. Due to multiple changes, will have him monitor BG and follow up in 2-3 weeks for log.  Debbora Dus, PharmD Clinical Pharmacist [place] [facility] at [facility] [phone]

## 2020-11-13 ENCOUNTER — Ambulatory Visit: Payer: Medicare Other | Admitting: Physician Assistant

## 2020-11-18 ENCOUNTER — Ambulatory Visit (INDEPENDENT_AMBULATORY_CARE_PROVIDER_SITE_OTHER): Payer: Medicare Other

## 2020-11-18 ENCOUNTER — Other Ambulatory Visit: Payer: Self-pay

## 2020-11-18 DIAGNOSIS — I10 Essential (primary) hypertension: Secondary | ICD-10-CM

## 2020-11-18 DIAGNOSIS — E1142 Type 2 diabetes mellitus with diabetic polyneuropathy: Secondary | ICD-10-CM

## 2020-11-18 NOTE — Patient Instructions (Signed)
Dear Christian [name],  Below is a summary of the goals we discussed during our follow up appointment on November 18, 2020. Please contact me anytime with questions or concerns.   Visit Information  Patient Care Plan: CCM Pharmacy Care Plan     Problem Identified: CHL AMB "PATIENT-SPECIFIC PROBLEM"      Long-Range Goal: Disease Management   Start Date: 07/18/2020  Priority: High  Note:   Current Barriers: Unable to achieve control of diabetes    Pharmacist Clinical Goal(s):  Patient will adhere to plan to optimize therapeutic regimen for diabetes as evidenced by report of adherence to recommended medication management changes through collaboration with PharmD and provider.   Interventions: 1:1 collaboration with Tonia Ghent, MD regarding development and update of comprehensive plan of care as evidenced by provider attestation and co-signature Inter-disciplinary care team collaboration (see longitudinal plan of care) Comprehensive medication review performed; medication list updated in electronic medical record   Hypertension (BP goal <140/90) -Controlled - per home readings, much improved and within goal -Current treatment: Amlodipine 5 mg - 1 tablet daily Lisinopril 30 mg - 1 tablet daily HCTZ 12.5 mg - 1 tablet daily -Medications previously tried: none -Patient confirms daily adherence in pill packs -Uses an arm cuff for at home monitoring -Current home readings: checks daily in morning before coffee or medications 118/76, 71 (yesterday), 127/78, 68 on recheck yesterday  124/77, 71 (today) -Current dietary habits: trying to limit salt, low sodium diet was reviewed by nurse 09/11/20. -Takes morning meds at breakfast, night meds at 8:45 PM -He avoids frequent use of NSAIDs. -Denies hypotensive/hypertensive symptoms -Continue to monitor BP at home daily, document, and provide log at future appointments -Recommended continue current medications   Diabetes (A1c goal  <7%) -Uncontrolled - Home BG > 200 and last A1c 9.1% (08/08/2020) -Pt has been off and on metformin due to GI intolerance. He is currently off for the past week. He is unable to tolerate more than 500 mg/day. He also had a steroid injection on 11/07/20. -Current medications: Levemir - Inject 34 units daily at bedtime Glipizide 5 mg - 2 in the morning and 1 at night -Medications previously tried: none -Current home glucose readings - last 2 days fasting fasting glucose:  This morning - 302 (beans, corn, tomatoes, fried chicken/gravy last night) Yesterday - 283  -Recommend restart metformin 500 mg/day. Increase Levemir to 37 units daily.   Patient Goals/Self-Care Activities Patient will: - take medications as prescribed check glucose daily, document, and provide at future appointments check blood pressure once weekly, document, and provide at future appointments      Patient verbalizes understanding of instructions provided today and agrees to view in [place].   Debbora Dus, PharmD Clinical Pharmacist [place] [facility] at [facility] [phone]

## 2020-11-18 NOTE — Progress Notes (Signed)
Chronic Care Management  Pharmacy Note  11/18/20 Name:  Christian [name] MRN:  [id number] DOB:  1940/11/01  Summary: Patient started pill packs and is doing well with adherence. BP 120s/70s at home. BG elevated. Fasting 283 yesterday, 302 today. Continues Levemir 34 units daily and glipizide. He had a steroid injection on 11/07/20. He is agreeable to restart 500 mg metformin/day (max tolerated dose) and follow up call in 1 week.  Recommendation: Start metformin again at 500 mg/day. Increase Levemir by 10% from 34 to 37 units at bedtime.  Plan: Follow up call with pharmacist in 7 days  Subjective: Christian [name] is an 80 y.o. year old male who is a primary patient of [name], [name], MD.  The CCM team was consulted for assistance with disease management and care coordination needs.    Engaged with patient by telephone for follow up visit in response to provider referral for pharmacy case management   Consent to Services:  The patient was given information about Chronic Care Management services, agreed to services, and gave verbal consent prior to initiation of services.  Please see initial visit note for detailed documentation.   Patient Care Team: Tonia Ghent, MD as PCP - General (Family Medicine) Debbora Dus, [facility] as Pharmacist (Pharmacist)  Recent office visits: 11/07/20 - Dr. Lorelei Pont - Steroid injection in shoulder  Recent consult visits: None since last CCM contact  Hospital visits: None since last CCM contact  Objective:  Lab Results  Component Value Date   CREATININE 1.10 08/30/2020   BUN 17 08/30/2020   GFR 66.20 08/08/2020   GFRNONAA >60 08/22/2020   GFRAA >60 05/17/2016   NA 143 08/30/2020   K 4.8 08/30/2020   CALCIUM 9.1 08/30/2020   CO2 24 08/30/2020   GLUCOSE 264 (H) 08/30/2020    Lab Results  Component Value Date/Time   HGBA1C 9.1 (H) 08/08/2020 10:03 AM   HGBA1C 9.5 (A) 04/16/2020 09:27 AM   HGBA1C 9.8 (H) 01/04/2020 08:02 AM   GFR 66.20  08/08/2020 10:03 AM   GFR 49.73 (L) 05/09/2020 11:23 AM   MICROALBUR 1.6 01/04/2020 08:02 AM   MICROALBUR 0.7 03/07/2018 09:41 AM    Last diabetic Eye exam:  Lab Results  Component Value Date/Time   HMDIABEYEEXA No Retinopathy 08/30/2020 12:00 AM    Last diabetic Foot exam: 08/08/20  PCP diabetic foot ulcer  Lab Results  Component Value Date   CHOL 85 01/04/2020   HDL 27.70 (L) 01/04/2020   LDLDIRECT 19.0 01/04/2020   TRIG 391.0 (H) 01/04/2020   CHOLHDL 3 01/04/2020    Hepatic Function Latest Ref Rng & Units 08/18/2020 01/04/2020 03/01/2018  Total Protein 6.5 - 8.1 g/dL 6.7 7.3 7.3  Albumin 3.5 - 5.0 g/dL 3.3(L) 4.2 4.3  AST 15 - 41 U/L _0 ALT 0 - 44 U/L _1 Alk Phosphatase 38 - 126 U/L 55 64 74  Total Bilirubin 0.3 - 1.2 mg/dL 0.9 0.9 0.8    Lab Results  Component Value Date/Time   TSH 3.337 03/06/2016 03:45 AM    CBC Latest Ref Rng & Units 08/30/2020 08/22/2020 08/19/2020  WBC 3.8 - 10.8 Thousand/uL 9.2 8.7 7.2  Hemoglobin 13.2 - 17.1 g/dL 12.4(L) 11.8(L) 10.7(L)  Hematocrit 38.5 - 50.0 % 38.0(L) 36.6(L) 33.6(L)  Platelets 140 - 400 Thousand/uL 222 232 185   Clinical ASCVD: No  The ASCVD Risk score Mikey Bussing DC Jr., et al., 2013) failed to calculate for the following reasons:  The valid total cholesterol range is 130 to 320 mg/dL    Depression screen [facility] 2/9 04/16/2020 03/08/2018 06/03/2016  Decreased Interest 0 0 0  Down, Depressed, Hopeless 0 0 0  PHQ - 2 Score 0 0 0    Social History   Tobacco Use  Smoking Status Never  Smokeless Tobacco Never   BP Readings from Last 3 Encounters:  11/07/20 120/64  08/30/20 140/68  08/22/20 (!) 150/77   Pulse Readings from Last 3 Encounters:  11/07/20 (!) 57  08/30/20 (!) 104  08/22/20 62   Wt Readings from Last 3 Encounters:  11/07/20 219 lb 8 oz (99.6 kg)  08/30/20 217 lb (98.4 kg)  08/08/20 217 lb (98.4 kg)   BMI Readings from Last 3 Encounters:  11/07/20 28.18 kg/m  08/30/20 27.86 kg/m  08/08/20  27.86 kg/m    Assessment/Interventions: Review of patient past medical history, allergies, medications, health status, including review of consultants reports, laboratory and other test data, was performed as part of comprehensive evaluation and provision of chronic care management services.   SDOH:  (Social Determinants of Health) assessments and interventions performed: Yes - addresses at previous visit  SDOH Screenings   Alcohol Screen: Not on file  Depression (PHQ2-9): Low Risk    PHQ-2 Score: 0  Financial Resource Strain: Low Risk    Difficulty of Paying Living Expenses: Not very hard  Food Insecurity: Not on file  Housing: Not on file  Physical Activity: Not on file  Social Connections: Not on file  Stress: Not on file  Tobacco Use: Low Risk    Smoking Tobacco Use: Never   Smokeless Tobacco Use: Never  Transportation Needs: Not on file    [place]  No Known Allergies  Medications Reviewed Today     Reviewed by Debbora Dus, [facility] (Pharmacist) on 11/11/20 at [place] List Status: <None>   Medication Order Taking? Sig Documenting Provider Last Dose Status Informant  amLODipine (NORVASC) 5 MG tablet [id number]  Take 1 tablet (5 mg total) by mouth daily. Tonia Ghent, MD  Active   atorvastatin (LIPITOR) 80 MG tablet [id number] No Take 1 tablet (80 mg total) by mouth at bedtime. Tonia Ghent, MD Taking Active Self  glipiZIDE (GLUCOTROL) 5 MG tablet [id number] No Take 2 tablets by mouth in the morning and 1 tablet by mouth at night. Tonia Ghent, MD Taking Active Self  glucose blood [facility] ULTRA) test strip [id number]  USE 1 STRIP TO CHECK GLUCOSE ONCE DAILY Tonia Ghent, MD  Active   hydrochlorothiazide (HYDRODIURIL) 12.5 MG tablet [id number]  Take 1 tablet (12.5 mg total) by mouth daily. Tonia Ghent, MD  Active   insulin detemir [facility]) 100 UNIT/ML FlexPen [id number] No INJECT UP TO 20-36 UNITS SUBCUTANEOUSLY ONCE DAILY Tonia Ghent,  MD Taking Active   Insulin Pen Needle (RELION PEN NEEDLE 31G/8MM) 31G X 8 MM MISC [id number]  USE TO INJECT LEVEMIR DAILY Tonia Ghent, MD  Active   lisinopril (ZESTRIL) 30 MG tablet [id number] No Take 1 tablet (30 mg total) by mouth daily. Tonia Ghent, MD Taking Active Self  metFORMIN (GLUCOPHAGE) 1000 MG tablet [id number]  Take 1 tablet (1,000 mg total) by mouth 2 (two) times daily with a meal.  Patient taking differently: Take 500 mg by mouth daily with breakfast.   Tonia Ghent, MD  Active             Patient Active Problem List  Diagnosis Date Noted   Osteomyelitis of fifth toe of right foot ([place])    Diabetic foot infection ([place]) 08/18/2020   Diabetic foot ulcer ([place]) 08/11/2020   Healthcare maintenance 01/10/2020   Lower back pain 09/27/2019   Thumb pain 06/25/2019   Medicare annual wellness visit, subsequent 03/12/2018   Advance care planning 03/12/2018   Shoulder pain 02/03/2017   Edema 11/02/2016   Diabetes mellitus with microalbuminuria ([place]) 06/10/2016   HLD (hyperlipidemia) 06/10/2016   DM type 2 with diabetic peripheral neuropathy ([place])    HTN (hypertension)    Accident caused by farm tractor 01/15/2016   Colon polyps 06/24/2013    Immunization History  Administered Date(s) Administered   Fluad Quad(high Dose 65+) 01/05/2019, 01/09/2020   Influenza,inj,Quad PF,6+ Mos 02/20/2013, 01/03/2014, 02/05/2015, 01/15/2016, 03/26/2016, 02/02/2017   Influenza-Unspecified 04/13/2010, 01/11/2018   PFIZER(Purple Top)SARS-COV-2 Vaccination 05/23/2019, 06/17/2019   Pneumococcal Conjugate-13 01/06/2012, 01/03/2014   Pneumococcal Polysaccharide-23 12/19/2012   Td 05/14/2014   Zoster Recombinat (Shingrix) 10/27/2018, 01/31/2019   Zoster, Live 12/19/2014    Conditions to be addressed/monitored:  Hypertension and Diabetes  Care Plan : [place]  Updates made by Debbora Dus, [place] since 11/18/2020 12:00 AM     Problem: CHL AMB "PATIENT-SPECIFIC  PROBLEM"      Long-Range Goal: Disease Management   Start Date: 07/18/2020  Priority: High  Note:   Current Barriers: Unable to achieve control of diabetes    Pharmacist Clinical Goal(s):  Patient will adhere to plan to optimize therapeutic regimen for diabetes as evidenced by report of adherence to recommended medication management changes through collaboration with PharmD and provider.   Interventions: 1:1 collaboration with Tonia Ghent, MD regarding development and update of comprehensive plan of care as evidenced by provider attestation and co-signature Inter-disciplinary care team collaboration (see longitudinal plan of care) Comprehensive medication review performed; medication list updated in electronic medical record   Hypertension (BP goal <140/90) -Controlled - per home readings, much improved and within goal -Current treatment: Amlodipine 5 mg - 1 tablet daily Lisinopril 30 mg - 1 tablet daily HCTZ 12.5 mg - 1 tablet daily -Medications previously tried: none -Patient confirms daily adherence in pill packs -Uses an arm cuff for at home monitoring -Current home readings: checks daily in morning before coffee or medications 118/76, 71 (yesterday), 127/78, 68 on recheck yesterday  124/77, 71 (today) -Current dietary habits: trying to limit salt, low sodium diet was reviewed by nurse 09/11/20. -Takes morning meds at breakfast, night meds at 8:45 PM -He avoids frequent use of NSAIDs. -Denies hypotensive/hypertensive symptoms -Continue to monitor BP at home daily, document, and provide log at future appointments -Recommended continue current medications   Diabetes (A1c goal <7%) -Uncontrolled - Home BG > 200 and last A1c 9.1% (08/08/2020) -Pt has been off and on metformin due to GI intolerance. He is currently off for the past week. He is unable to tolerate more than 500 mg/day. He also had a steroid injection on 11/07/20. -Current medications: Levemir - Inject 34 units  daily at bedtime Glipizide 5 mg - 2 in the morning and 1 at night -Medications previously tried: none -Current home glucose readings - last 2 days fasting fasting glucose:  This morning - 302 (beans, corn, tomatoes, fried chicken/gravy last night) Yesterday - 283  -Recommend restart metformin 500 mg/day. Increase Levemir to 37 units daily.   Patient Goals/Self-Care Activities Patient will: - take medications as prescribed check glucose daily, document, and provide at future appointments check blood  pressure once weekly, document, and provide at future appointments     Follow Up Plan: Telephone follow up appointment with care management team member scheduled for:  7 days  Medication Assistance: None required.  Patient affirms current coverage meets needs.   Star Rating Drugs:  Medication:                Last Fill:         Day Supply Lisinopril 30 mg 11/05/20 30 DS Atorvastatin 80 mg  11/05/20 30 DS   Patient's preferred pharmacy is: [facility] Pt received the following in adherence packaging on 11/08/20 to start on 11/10/20.  Packs:  Amlodipine 2.5 mg - 2 tabs at breakfast Atorvastatin 80 mg - 1 evening meal  Glipizide 5 mg - 2 breakfast, 1 evening meal Lisinopril 30 mg - 1 evening meal  Prevagen Regular Strength - 1 at breakfast   Care Plan and Follow Up Patient Decision:  Patient agrees to Care Plan and Follow-up.  Debbora Dus, PharmD Clinical Pharmacist [facility] at [facility] [phone]

## 2020-11-25 ENCOUNTER — Ambulatory Visit: Payer: Medicare Other

## 2020-11-25 ENCOUNTER — Other Ambulatory Visit: Payer: Self-pay

## 2020-11-25 ENCOUNTER — Telehealth: Payer: Self-pay

## 2020-11-25 DIAGNOSIS — I10 Essential (primary) hypertension: Secondary | ICD-10-CM

## 2020-11-25 DIAGNOSIS — E1142 Type 2 diabetes mellitus with diabetic polyneuropathy: Secondary | ICD-10-CM

## 2020-11-25 NOTE — Progress Notes (Signed)
Chronic Care Management  Pharmacy Note  11/25/20 Name:  Christian [name] MRN:  [id number] DOB:  10-15-1940   Summary: Fasting BG remains elevated 178-360 for one week after increase Levemir from 35 to 37 units at bedtime. Continues metformin 500 mg AM and glipizide 10 mg AM and 5 mg PM. No hypoglycemia.   Recommendation:  Increase Levemir by 10% from 37 to 40 units at bedtime. Consult PCP regarding Jardiance start.  Plan: Follow up call with pharmacist in 7 days  Subjective: Christian [name] is an 80 y.o. year old male who is a primary patient of [name], [name], MD.  The CCM team was consulted for assistance with disease management and care coordination needs.    Engaged with patient by telephone for follow up visit in response to provider referral for pharmacy case management   Consent to Services:  The patient was given information about Chronic Care Management services, agreed to services, and gave verbal consent prior to initiation of services.  Please see initial visit note for detailed documentation.   Patient Care Team: Tonia Ghent, MD as PCP - General (Family Medicine) Debbora Dus, [facility] as Pharmacist (Pharmacist)  Recent office visits: 11/07/20 - Dr. Lorelei Pont - Steroid injection in shoulder  Recent consult visits: None since last CCM contact  Hospital visits: None since last CCM contact  Objective:  Lab Results  Component Value Date   CREATININE 1.10 08/30/2020   BUN 17 08/30/2020   GFR 66.20 08/08/2020   GFRNONAA >60 08/22/2020   GFRAA >60 05/17/2016   NA 143 08/30/2020   K 4.8 08/30/2020   CALCIUM 9.1 08/30/2020   CO2 24 08/30/2020   GLUCOSE 264 (H) 08/30/2020    Lab Results  Component Value Date/Time   HGBA1C 9.1 (H) 08/08/2020 10:03 AM   HGBA1C 9.5 (A) 04/16/2020 09:27 AM   HGBA1C 9.8 (H) 01/04/2020 08:02 AM   GFR 66.20 08/08/2020 10:03 AM   GFR 49.73 (L) 05/09/2020 11:23 AM   MICROALBUR 1.6 01/04/2020 08:02 AM   MICROALBUR 0.7 03/07/2018  09:41 AM    Last diabetic Eye exam:  Lab Results  Component Value Date/Time   HMDIABEYEEXA No Retinopathy 08/30/2020 12:00 AM    Last diabetic Foot exam: 08/08/20  PCP diabetic foot ulcer  Lab Results  Component Value Date   CHOL 85 01/04/2020   HDL 27.70 (L) 01/04/2020   LDLDIRECT 19.0 01/04/2020   TRIG 391.0 (H) 01/04/2020   CHOLHDL 3 01/04/2020    Hepatic Function Latest Ref Rng & Units 08/18/2020 01/04/2020 03/01/2018  Total Protein 6.5 - 8.1 g/dL 6.7 7.3 7.3  Albumin 3.5 - 5.0 g/dL 3.3(L) 4.2 4.3  AST 15 - 41 U/L '23 26 29  ' ALT 0 - 44 U/L '18 19 23  ' Alk Phosphatase 38 - 126 U/L 55 64 74  Total Bilirubin 0.3 - 1.2 mg/dL 0.9 0.9 0.8    Lab Results  Component Value Date/Time   TSH 3.337 03/06/2016 03:45 AM    CBC Latest Ref Rng & Units 08/30/2020 08/22/2020 08/19/2020  WBC 3.8 - 10.8 Thousand/uL 9.2 8.7 7.2  Hemoglobin 13.2 - 17.1 g/dL 12.4(L) 11.8(L) 10.7(L)  Hematocrit 38.5 - 50.0 % 38.0(L) 36.6(L) 33.6(L)  Platelets 140 - 400 Thousand/uL 222 232 185   Clinical ASCVD: No  The ASCVD Risk score Mikey Bussing DC Jr., et al., 2013) failed to calculate for the following reasons:   The valid total cholesterol range is 130 to 320 mg/dL    Depression screen PHQ  2/9 04/16/2020 03/08/2018 06/03/2016  Decreased Interest 0 0 0  Down, Depressed, Hopeless 0 0 0  PHQ - 2 Score 0 0 0    Social History   Tobacco Use  Smoking Status Never  Smokeless Tobacco Never   BP Readings from Last 3 Encounters:  11/07/20 120/64  08/30/20 140/68  08/22/20 (!) 150/77   Pulse Readings from Last 3 Encounters:  11/07/20 (!) 57  08/30/20 (!) 104  08/22/20 62   Wt Readings from Last 3 Encounters:  11/07/20 219 lb 8 oz (99.6 kg)  08/30/20 217 lb (98.4 kg)  08/08/20 217 lb (98.4 kg)   BMI Readings from Last 3 Encounters:  11/07/20 28.18 kg/m  08/30/20 27.86 kg/m  08/08/20 27.86 kg/m    Assessment/Interventions: Review of patient past medical history, allergies, medications, health status,  including review of consultants reports, laboratory and other test data, was performed as part of comprehensive evaluation and provision of chronic care management services.   SDOH:  (Social Determinants of Health) assessments and interventions performed: No  SDOH Screenings   Alcohol Screen: Not on file  Depression (PHQ2-9): Low Risk    PHQ-2 Score: 0  Financial Resource Strain: Low Risk    Difficulty of Paying Living Expenses: Not very hard  Food Insecurity: Not on file  Housing: Not on file  Physical Activity: Not on file  Social Connections: Not on file  Stress: Not on file  Tobacco Use: Low Risk    Smoking Tobacco Use: Never   Smokeless Tobacco Use: Never  Transportation Needs: Not on file    [place]  No Known Allergies  Medications Reviewed Today     Reviewed by Debbora Dus, [facility] (Pharmacist) on 11/25/20 at [place] List Status: <None>   Medication Order Taking? Sig Documenting Provider Last Dose Status Informant  amLODipine (NORVASC) 5 MG tablet [id number] No Take 1 tablet (5 mg total) by mouth daily. Tonia Ghent, MD Taking Active   atorvastatin (LIPITOR) 80 MG tablet [id number] No Take 1 tablet (80 mg total) by mouth at bedtime. Tonia Ghent, MD Taking Active Self  glipiZIDE (GLUCOTROL) 5 MG tablet [id number] No Take 2 tablets by mouth in the morning and 1 tablet by mouth at night. Tonia Ghent, MD Taking Active Self  glucose blood [facility] ULTRA) test strip [id number]  USE 1 STRIP TO CHECK GLUCOSE ONCE DAILY Tonia Ghent, MD  Active   hydrochlorothiazide (HYDRODIURIL) 12.5 MG tablet [id number]  Take 1 tablet (12.5 mg total) by mouth daily. Tonia Ghent, MD  Active   insulin detemir [facility]) 100 UNIT/ML FlexPen [id number] No INJECT UP TO 20-36 UNITS SUBCUTANEOUSLY ONCE DAILY Tonia Ghent, MD Taking Active   Insulin Pen Needle (RELION PEN NEEDLE 31G/8MM) 31G X 8 MM MISC [id number]  USE TO INJECT LEVEMIR DAILY Tonia Ghent, MD   Active   lisinopril (ZESTRIL) 30 MG tablet [id number] No Take 1 tablet (30 mg total) by mouth daily. Tonia Ghent, MD Taking Active Self  metFORMIN (GLUCOPHAGE) 1000 MG tablet [id number]  Take 1 tablet (1,000 mg total) by mouth 2 (two) times daily with a meal.  Patient taking differently: Take 500 mg by mouth daily with breakfast.   Tonia Ghent, MD  Active             Patient Active Problem List   Diagnosis Date Noted   Osteomyelitis of fifth toe of right foot ([place])    Diabetic foot infection ([place]) 08/18/2020  Diabetic foot ulcer ([place]) 08/11/2020   Healthcare maintenance 01/10/2020   Lower back pain 09/27/2019   Thumb pain 06/25/2019   Medicare annual wellness visit, subsequent 03/12/2018   Advance care planning 03/12/2018   Shoulder pain 02/03/2017   Edema 11/02/2016   Diabetes mellitus with microalbuminuria ([place]) 06/10/2016   HLD (hyperlipidemia) 06/10/2016   DM type 2 with diabetic peripheral neuropathy ([place])    HTN (hypertension)    Accident caused by farm tractor 01/15/2016   Colon polyps 06/24/2013    Immunization History  Administered Date(s) Administered   Fluad Quad(high Dose 65+) 01/05/2019, 01/09/2020   Influenza,inj,Quad PF,6+ Mos 02/20/2013, 01/03/2014, 02/05/2015, 01/15/2016, 03/26/2016, 02/02/2017   Influenza-Unspecified 04/13/2010, 01/11/2018   PFIZER(Purple Top)SARS-COV-2 Vaccination 05/23/2019, 06/17/2019   Pneumococcal Conjugate-13 01/06/2012, 01/03/2014   Pneumococcal Polysaccharide-23 12/19/2012   Td 05/14/2014   Zoster Recombinat (Shingrix) 10/27/2018, 01/31/2019   Zoster, Live 12/19/2014    Conditions to be addressed/monitored:  Hypertension and Diabetes  Care Plan : [place]  Updates made by Debbora Dus, [place] since 11/25/2020 12:00 AM     Problem: CHL AMB "PATIENT-SPECIFIC PROBLEM"      Long-Range Goal: Disease Management   Start Date: 07/18/2020  This Visit's Progress: On track  Priority: High  Note:   Current  Barriers: Unable to achieve control of diabetes    Pharmacist Clinical Goal(s):  Patient will adhere to plan to optimize therapeutic regimen for diabetes as evidenced by report of adherence to recommended medication management changes through collaboration with PharmD and provider.   Interventions: 1:1 collaboration with Tonia Ghent, MD regarding development and update of comprehensive plan of care as evidenced by provider attestation and co-signature Inter-disciplinary care team collaboration (see longitudinal plan of care) Comprehensive medication review performed; medication list updated in electronic medical record   Hypertension (BP goal <140/90) -Controlled - per home readings, stable and within goal -Current treatment: Amlodipine 5 mg - 1 tablet daily Lisinopril 30 mg - 1 tablet daily HCTZ 12.5 mg - 1 tablet daily -Medications previously tried: none -Patient confirms daily adherence in pill packs -Uses an arm cuff for at home monitoring -Current home readings: checks daily in morning before coffee or medications 8/15 115/72, 109; 119/60, 99 (recheck) 8/14 140/75, 76 8/13 116/82, 78 8/12 147/82, 78 8/11 140/84, 71; 123/78 (recheck) -Current dietary habits: trying to limit salt, low sodium diet was reviewed by nurse 09/11/20. -Takes morning meds at breakfast, night meds at 8:45 PM -He avoids frequent use of NSAIDs. -Denies hypotensive/hypertensive symptoms -Continue to monitor BP at home daily, document, and provide log at future appointments -Recommended continue current medications   Diabetes (A1c goal <7%) -Uncontrolled - Fasting BG 178-362 this week on increased Levemir 37 units. Last A1c 9.1% (08/08/2020). Pt wife is concerned about his BG and would like to consider alternative meds. I agree his BG is high and and Levemir still needs to be titrated to fasting BG < 150. Recent steroid injection (7/28) set him back some. Now he has cold symptoms - Sore throat, diarrhea,  headache, fatigued last 2 days. Started feeling it Saturday and improving today. No fever. No SOB or chest pain. Reviewed sick day precautions. Self manage, if symptoms progress please call office.  -Current medications: Levemir - Inject 37 units daily at bedtime Glipizide 5 mg - 2 in the morning and 1 at night Metformin 1000 mg - take 1/2 tablet in morning  -Affirms taking above medications as prescribed. Levemir increased from 34 to 27 units qhs  11/18/20. -Medications previously tried: higher dose metformin - severe GI upset  -Current home glucose readings  Fasting glucose: Last 5 days - 362 (does not recall if he took the insulin last night), 241, 226, 178, 292 Post-prandial: none -Recommend increase Levemir to 40 units daily. Consult PCP regarding Jardiance.    Patient Goals/Self-Care Activities Patient will: - take medications as prescribed - check glucose daily, document, and provide at future appointments - check blood pressure once weekly, document, and provide at future appointments  Follow Up Plan: Telephone follow up appointment with care management team member scheduled for:  7 days     Medication Assistance: None required.  Patient affirms current coverage meets needs.   Star Rating Drugs:  Medication:                Last Fill:         Day Supply Lisinopril 30 mg 11/05/20 30 DS Atorvastatin 80 mg  11/05/20 30 DS   Patient's preferred pharmacy is: [facility] Pt received the following in adherence packaging on 11/08/20 to start on 11/10/20.  Packs:  Amlodipine 2.5 mg - 2 tabs at breakfast Atorvastatin 80 mg - 1 evening meal  Glipizide 5 mg - 2 breakfast, 1 evening meal Lisinopril 30 mg - 1 evening meal  Prevagen Regular Strength - 1 at breakfast  Care Plan and Follow Up Patient Decision:  Patient agrees to Care Plan and Follow-up.  Debbora Dus, PharmD Clinical Pharmacist [facility] at [facility] [phone]

## 2020-11-25 NOTE — Patient Instructions (Signed)
Dear Christian [name],  Below is a summary of the goals we discussed during our follow up appointment on November 25, 2020. Please contact me anytime with questions or concerns.   Visit Information  Patient Care Plan: CCM Pharmacy Care Plan     Problem Identified: CHL AMB "PATIENT-SPECIFIC PROBLEM"      Long-Range Goal: Disease Management   Start Date: 07/18/2020  This Visit's Progress: On track  Priority: High  Note:   Current Barriers: Unable to achieve control of diabetes    Pharmacist Clinical Goal(s):  Patient will adhere to plan to optimize therapeutic regimen for diabetes as evidenced by report of adherence to recommended medication management changes through collaboration with PharmD and provider.   Interventions: 1:1 collaboration with Tonia Ghent, MD regarding development and update of comprehensive plan of care as evidenced by provider attestation and co-signature Inter-disciplinary care team collaboration (see longitudinal plan of care) Comprehensive medication review performed; medication list updated in electronic medical record   Hypertension (BP goal <140/90) -Controlled - per home readings, stable and within goal -Current treatment: Amlodipine 5 mg - 1 tablet daily Lisinopril 30 mg - 1 tablet daily HCTZ 12.5 mg - 1 tablet daily -Medications previously tried: none -Patient confirms daily adherence in pill packs -Uses an arm cuff for at home monitoring -Current home readings: checks daily in morning before coffee or medications 8/15 115/72, 109; 119/60, 99 (recheck) 8/14 140/75, 76 8/13 116/82, 78 8/12 147/82, 78 8/11 140/84, 71; 123/78 (recheck) -Current dietary habits: trying to limit salt, low sodium diet was reviewed by nurse 09/11/20. -Takes morning meds at breakfast, night meds at 8:45 PM -He avoids frequent use of NSAIDs. -Denies hypotensive/hypertensive symptoms -Continue to monitor BP at home daily, document, and provide log at future  appointments -Recommended continue current medications   Diabetes (A1c goal <7%) -Uncontrolled - Fasting BG 178-362 this week on increased Levemir 37 units. Last A1c 9.1% (08/08/2020). Pt wife is concerned about his BG and would like to consider alternative meds. I agree his BG is high and and Levemir still needs to be titrated to fasting BG < 150. Recent steroid injection (7/28) set him back some. Now he has cold symptoms - Sore throat, diarrhea, headache, fatigued last 2 days. Started feeling it Saturday and improving today. No fever. No SOB or chest pain. Reviewed sick day precautions. Self manage, if symptoms progress please call office.  -Current medications: Levemir - Inject 37 units daily at bedtime Glipizide 5 mg - 2 in the morning and 1 at night Metformin 1000 mg - take 1/2 tablet in morning  -Affirms taking above medications as prescribed. Levemir increased from 34 to 27 units qhs 11/18/20. -Medications previously tried: higher dose metformin - severe GI upset  -Current home glucose readings  Fasting glucose: Last 5 days - 362 (does not recall if he took the insulin last night), 241, 226, 178, 292 Post-prandial: none -Recommend increase Levemir to 40 units daily. Consult PCP regarding Jardiance.    Patient Goals/Self-Care Activities Patient will: - take medications as prescribed - check glucose daily, document, and provide at future appointments - check blood pressure once weekly, document, and provide at future appointments  Follow Up Plan: Telephone follow up appointment with care management team member scheduled for:  7 days      Patient verbalizes understanding of instructions provided today and agrees to view in [place].   Debbora Dus, PharmD Clinical Pharmacist [facility] at [facility] [phone]

## 2020-11-25 NOTE — Telephone Encounter (Addendum)
Patients diabetes remains poorly controlled. Max tolerated metformin 500 mg/day due to GI upset. Continues glipizide 5 mg 2 in AM and one in evening. Levemir 37 units daily. Fasting BG this week - 178-362 despite increasing Levemir from 34 to 37 units last week. Recommend increase Levemir to 40 units and start Jardiance 10 mg daily. If approved, please send updated rxs to [facility]. We will check cost. Thanks!  Lab Results  Component Value Date   CREATININE 1.10 08/30/2020   BUN 17 08/30/2020   GFR 66.20 08/08/2020   GFRNONAA >60 08/22/2020   GFRAA >60 05/17/2016   NA 143 08/30/2020   K 4.8 08/30/2020   CALCIUM 9.1 08/30/2020   CO2 24 08/30/2020    Lab Results  Component Value Date/Time   HGBA1C 9.1 (H) 08/08/2020 10:03 AM   HGBA1C 9.5 (A) 04/16/2020 09:27 AM   HGBA1C 9.8 (H) 01/04/2020 08:02 AM    Debbora Dus, PharmD Clinical Pharmacist [place] [facility] at [facility] [phone]

## 2020-11-26 MED ORDER — LEVEMIR FLEXTOUCH 100 UNIT/ML ~~LOC~~ SOPN: PEN_INJECTOR | SUBCUTANEOUS | 3 refills | Status: DC

## 2020-11-26 MED ORDER — EMPAGLIFLOZIN 10 MG PO TABS: 10.0000 mg | ORAL_TABLET | Freq: Every day | ORAL | 3 refills | Status: DC

## 2020-11-26 NOTE — Telephone Encounter (Signed)
I updated the insulin rx in the EMR and sent rx for jardiance.  Thanks.

## 2020-11-28 NOTE — Telephone Encounter (Addendum)
Called to discuss Jardiance cost with patient. He is okay with cost for 30 day supply, $47. Deliver from [facility] tomorrow. If he tolerates, we will apply for PAP.  Wife mentioned some concerns -  pt having diarrhea with metformin again. He stopped it this week.Taking 1/2 tablet of the 1000 mg each morning by his preference. We agreed at last visit to stop metformin but he had restarted it on his own after feeling better off for a few weeks. Agreed he should stop metformin altogether as of today.   Blood glucose log from today 11/28/20: Monday - 363 (increase his insulin to 40 units this evening) Tuesday - 189   114/80, 82 (stopped metformin this day - diarrhea improving) Wednesday - 158 110/76, 90 Thursday - 200   98/59, 86 (had a pepsi and cheeseburger day before)  He reports drinking a lot of water, staying hydrated. He is still feeling under the weather this week.  He is requesting his Levemir rx be sent to [facility]. Will request from PCP.   Debbora Dus, PharmD Clinical Pharmacist [facility] at [facility] [phone]

## 2020-11-29 MED ORDER — LEVEMIR FLEXTOUCH 100 UNIT/ML ~~LOC~~ SOPN: PEN_INJECTOR | SUBCUTANEOUS | 3 refills | Status: DC

## 2020-11-29 NOTE — Addendum Note (Signed)
Addended by: Tonia Ghent on: 11/29/2020 07:53 AM   Modules accepted: Orders

## 2020-11-29 NOTE — Telephone Encounter (Signed)
Sent levemir. Thanks.

## 2020-12-02 ENCOUNTER — Other Ambulatory Visit: Payer: Self-pay

## 2020-12-02 ENCOUNTER — Ambulatory Visit: Payer: Medicare Other

## 2020-12-02 DIAGNOSIS — I10 Essential (primary) hypertension: Secondary | ICD-10-CM

## 2020-12-02 DIAGNOSIS — E1142 Type 2 diabetes mellitus with diabetic polyneuropathy: Secondary | ICD-10-CM

## 2020-12-02 NOTE — Patient Instructions (Signed)
Dear Christian [name],  Below is a summary of the goals we discussed during our follow up appointment on December 02, 2020. Please contact me anytime with questions or concerns.   Visit Information  Patient Care Plan: CCM Pharmacy Care Plan     Problem Identified: CHL AMB "PATIENT-SPECIFIC PROBLEM"      Long-Range Goal: Disease Management   Start Date: 07/18/2020  This Visit's Progress: On track  Recent Progress: On track  Priority: High  Note:   Current Barriers: Unable to achieve control of diabetes - improving    Pharmacist Clinical Goal(s):  Patient will adhere to plan to optimize therapeutic regimen for diabetes as evidenced by report of adherence to recommended medication management changes through collaboration with PharmD and provider.   Interventions: 1:1 collaboration with Tonia Ghent, MD regarding development and update of comprehensive plan of care as evidenced by provider attestation and co-signature Inter-disciplinary care team collaboration (see longitudinal plan of care) Comprehensive medication review performed; medication list updated in electronic medical record   Hypertension (BP goal <140/90) -Stable, no updates 12/02/20 -Controlled - per home readings, stable and within goal -Current treatment: Amlodipine 5 mg - 1 tablet daily Lisinopril 30 mg - 1 tablet daily HCTZ 12.5 mg - 1 tablet daily -Medications previously tried: none -Patient confirms daily adherence in pill packs -Uses an arm cuff for at home monitoring -Current home readings: checks daily in morning before coffee or medications 8/15 115/72, 109; 119/60, 99 (recheck) 8/14 140/75, 76 8/13 116/82, 78 8/12 147/82, 78 8/11 140/84, 71; 123/78 (recheck) -Current dietary habits: trying to limit salt, low sodium diet was reviewed by nurse 09/11/20. -Takes morning meds at breakfast, night meds at 8:45 PM -He avoids frequent use of NSAIDs. -Denies hypotensive/hypertensive symptoms -Continue to monitor  BP at home daily, document, and provide log at future appointments -Recommended continue current medications   Diabetes (A1c goal <7%) -Uncontrolled - Pt started Jardiance 11/30/20 and increased Levemir from 37 to 40 units on 11/28/20. Fasting BG improving but still elevated. Last A1c 9.1% (08/08/2020). He is feeling much better and cold symptoms have mostly resolved. -Current medications: Levemir - Inject 40 units daily at bedtime Glipizide 5 mg - 2 in the morning and 1 at night Jardiance 10 mg - 1 tablet daily -Affirms taking above medications as prescribed 12/02/20 -Medications previously tried: metformin 500 mg - severe GI upset  -Current home glucose readings  Fasting glucose:  8/22 - 165 8/21 - 219 8/20 - 183 - started Jardiance this AM 8/19 - 202 8/18 - 200 - increased Levemir to 40 units this PM Post-prandial: none -Current meal patterns:  Meals - 12/01/20 Breakfast - 2 eggs, toast Lunch - Chicken Parmesan from [employer] dog and cookie, [employer] chicken n gravy, [employer] -Spent 10 minutes discussing diabetes plate method. -Recommend increase Levemir from 40 to 43 units daily. Continue Jardiance 10 mg and glipizide. Patient to increase protein and green vegetables. Limit carbs.    Patient Goals/Self-Care Activities Patient will: - take medications as prescribed - check glucose daily, document, and provide at future appointments - check blood pressure once weekly, document, and provide at future appointments  Follow Up Plan: Telephone follow up appointment with care management team member scheduled for:  7 days      Patient verbalizes understanding of instructions provided today and agrees to view in [place].   Debbora Dus, PharmD Clinical Pharmacist [facility] at [facility] [phone]

## 2020-12-02 NOTE — Progress Notes (Signed)
Chronic Care Management  Pharmacy Note  12/02/20 Name:  Christian [name] MRN:  [id number] DOB:  05/01/1940   Summary: Fasting BG improving (165-219). He recently started Jardiance 10 mg on 11/30/20. Tolerating fine so far. He increased his Levemir from 37 to 40 units at bedtime on 11/28/20. Continues glipizide 10 mg AM and 5 mg PM. He remains off metformin due to GI upset. No symptoms of hypoglycemia. Discussed diabetes plate method.  Recommendation:  Since fasting BG still in 200s mostly, will increase Levemir from 40 to 43 units at bedtime. Continue Jardiance and glipizide same. Work on limiting breads and pasta and increasing lean meats and vegetables.  Plan: Follow up call with pharmacist in 7 days  Subjective: Christian [name] is an 80 y.o. year old male who is a primary patient of [name], [name], MD.  The CCM team was consulted for assistance with disease management and care coordination needs.    Engaged with patient by telephone for follow up visit in response to provider referral for pharmacy case management   Consent to Services:  The patient was given information about Chronic Care Management services, agreed to services, and gave verbal consent prior to initiation of services.  Please see initial visit note for detailed documentation.   Patient Care Team: Tonia Ghent, MD as PCP - General (Family Medicine) Debbora Dus, [facility] as Pharmacist (Pharmacist)  Recent office visits: 11/28/20 - CCM consult - start Jardiance 10 mg daily, increase Levemir to 37 units. 11/07/20 - Dr. Lorelei Pont - Steroid injection in shoulder  Recent consult visits: None since last CCM contact  Hospital visits: None since last CCM contact  Objective:  Lab Results  Component Value Date   CREATININE 1.10 08/30/2020   BUN 17 08/30/2020   GFR 66.20 08/08/2020   GFRNONAA >60 08/22/2020   GFRAA >60 05/17/2016   NA 143 08/30/2020   K 4.8 08/30/2020   CALCIUM 9.1 08/30/2020   CO2 24 08/30/2020    GLUCOSE 264 (H) 08/30/2020    Lab Results  Component Value Date/Time   HGBA1C 9.1 (H) 08/08/2020 10:03 AM   HGBA1C 9.5 (A) 04/16/2020 09:27 AM   HGBA1C 9.8 (H) 01/04/2020 08:02 AM   GFR 66.20 08/08/2020 10:03 AM   GFR 49.73 (L) 05/09/2020 11:23 AM   MICROALBUR 1.6 01/04/2020 08:02 AM   MICROALBUR 0.7 03/07/2018 09:41 AM    Last diabetic Eye exam:  Lab Results  Component Value Date/Time   HMDIABEYEEXA No Retinopathy 08/30/2020 12:00 AM    Last diabetic Foot exam: 08/08/20  PCP diabetic foot ulcer  Lab Results  Component Value Date   CHOL 85 01/04/2020   HDL 27.70 (L) 01/04/2020   LDLDIRECT 19.0 01/04/2020   TRIG 391.0 (H) 01/04/2020   CHOLHDL 3 01/04/2020    Hepatic Function Latest Ref Rng & Units 08/18/2020 01/04/2020 03/01/2018  Total Protein 6.5 - 8.1 g/dL 6.7 7.3 7.3  Albumin 3.5 - 5.0 g/dL 3.3(L) 4.2 4.3  AST 15 - 41 U/L '23 26 29  ' ALT 0 - 44 U/L '18 19 23  ' Alk Phosphatase 38 - 126 U/L 55 64 74  Total Bilirubin 0.3 - 1.2 mg/dL 0.9 0.9 0.8    Lab Results  Component Value Date/Time   TSH 3.337 03/06/2016 03:45 AM    CBC Latest Ref Rng & Units 08/30/2020 08/22/2020 08/19/2020  WBC 3.8 - 10.8 Thousand/uL 9.2 8.7 7.2  Hemoglobin 13.2 - 17.1 g/dL 12.4(L) 11.8(L) 10.7(L)  Hematocrit 38.5 - 50.0 % 38.0(L)  36.6(L) 33.6(L)  Platelets 140 - 400 Thousand/uL 222 232 185   Clinical ASCVD: No  The ASCVD Risk score Mikey Bussing DC Jr., et al., 2013) failed to calculate for the following reasons:   The valid total cholesterol range is 130 to 320 mg/dL    Depression screen [facility] 2/9 04/16/2020 03/08/2018 06/03/2016  Decreased Interest 0 0 0  Down, Depressed, Hopeless 0 0 0  PHQ - 2 Score 0 0 0    Social History   Tobacco Use  Smoking Status Never  Smokeless Tobacco Never   BP Readings from Last 3 Encounters:  11/07/20 120/64  08/30/20 140/68  08/22/20 (!) 150/77   Pulse Readings from Last 3 Encounters:  11/07/20 (!) 57  08/30/20 (!) 104  08/22/20 62   Wt Readings from Last 3  Encounters:  11/07/20 219 lb 8 oz (99.6 kg)  08/30/20 217 lb (98.4 kg)  08/08/20 217 lb (98.4 kg)   BMI Readings from Last 3 Encounters:  11/07/20 28.18 kg/m  08/30/20 27.86 kg/m  08/08/20 27.86 kg/m    Assessment/Interventions: Review of patient past medical history, allergies, medications, health status, including review of consultants reports, laboratory and other test data, was performed as part of comprehensive evaluation and provision of chronic care management services.   SDOH:  (Social Determinants of Health) assessments and interventions performed: No  SDOH Screenings   Alcohol Screen: Not on file  Depression (PHQ2-9): Low Risk    PHQ-2 Score: 0  Financial Resource Strain: Low Risk    Difficulty of Paying Living Expenses: Not very hard  Food Insecurity: Not on file  Housing: Not on file  Physical Activity: Not on file  Social Connections: Not on file  Stress: Not on file  Tobacco Use: Low Risk    Smoking Tobacco Use: Never   Smokeless Tobacco Use: Never  Transportation Needs: Not on file    [place]  No Known Allergies  Medications Reviewed Today     Reviewed by Debbora Dus, [facility] (Pharmacist) on 12/02/20 at [place] List Status: <None>   Medication Order Taking? Sig Documenting Provider Last Dose Status Informant  amLODipine (NORVASC) 5 MG tablet [id number]  Take 1 tablet (5 mg total) by mouth daily. Tonia Ghent, MD  Active   atorvastatin (LIPITOR) 80 MG tablet [id number]  Take 1 tablet (80 mg total) by mouth at bedtime. Tonia Ghent, MD  Active Self  empagliflozin (JARDIANCE) 10 MG TABS tablet [id number] Yes Take 1 tablet (10 mg total) by mouth daily before breakfast. Tonia Ghent, MD Taking Active   glipiZIDE (GLUCOTROL) 5 MG tablet [id number]  Take 2 tablets by mouth in the morning and 1 tablet by mouth at night. Tonia Ghent, MD  Active Self  glucose blood [facility] ULTRA) test strip [id number]  USE 1 STRIP TO CHECK GLUCOSE ONCE DAILY  Tonia Ghent, MD  Active   hydrochlorothiazide (HYDRODIURIL) 12.5 MG tablet [id number]  Take 1 tablet (12.5 mg total) by mouth daily. Tonia Ghent, MD  Active   insulin detemir [facility] FLEXTOUCH) 100 UNIT/ML FlexPen [id number] Yes INJECT UP TO 40 UNITS SUBCUTANEOUSLY ONCE DAILY Tonia Ghent, MD Taking Active   Insulin Pen Needle (RELION PEN NEEDLE 31G/8MM) 31G X 8 MM MISC [id number]  USE TO INJECT LEVEMIR DAILY Tonia Ghent, MD  Active   lisinopril (ZESTRIL) 30 MG tablet [id number]  Take 1 tablet (30 mg total) by mouth daily. Tonia Ghent, MD  Active Self  Patient Active Problem List   Diagnosis Date Noted   Osteomyelitis of fifth toe of right foot ([place])    Diabetic foot infection ([place]) 08/18/2020   Diabetic foot ulcer ([place]) 08/11/2020   Healthcare maintenance 01/10/2020   Lower back pain 09/27/2019   Thumb pain 06/25/2019   Medicare annual wellness visit, subsequent 03/12/2018   Advance care planning 03/12/2018   Shoulder pain 02/03/2017   Edema 11/02/2016   Diabetes mellitus with microalbuminuria ([place]) 06/10/2016   HLD (hyperlipidemia) 06/10/2016   DM type 2 with diabetic peripheral neuropathy ([place])    HTN (hypertension)    Accident caused by farm tractor 01/15/2016   Colon polyps 06/24/2013    Immunization History  Administered Date(s) Administered   Fluad Quad(high Dose 65+) 01/05/2019, 01/09/2020   Influenza,inj,Quad PF,6+ Mos 02/20/2013, 01/03/2014, 02/05/2015, 01/15/2016, 03/26/2016, 02/02/2017   Influenza-Unspecified 04/13/2010, 01/11/2018   PFIZER(Purple Top)SARS-COV-2 Vaccination 05/23/2019, 06/17/2019   Pneumococcal Conjugate-13 01/06/2012, 01/03/2014   Pneumococcal Polysaccharide-23 12/19/2012   Td 05/14/2014   Zoster Recombinat (Shingrix) 10/27/2018, 01/31/2019   Zoster, Live 12/19/2014    Conditions to be addressed/monitored:  Hypertension and Diabetes  Care Plan : [place]  Updates made by Debbora Dus, [place]  since 12/02/2020 12:00 AM     Problem: CHL AMB "PATIENT-SPECIFIC PROBLEM"      Long-Range Goal: Disease Management   Start Date: 07/18/2020  This Visit's Progress: On track  Recent Progress: On track  Priority: High  Note:   Current Barriers: Unable to achieve control of diabetes - improving    Pharmacist Clinical Goal(s):  Patient will adhere to plan to optimize therapeutic regimen for diabetes as evidenced by report of adherence to recommended medication management changes through collaboration with PharmD and provider.   Interventions: 1:1 collaboration with Tonia Ghent, MD regarding development and update of comprehensive plan of care as evidenced by provider attestation and co-signature Inter-disciplinary care team collaboration (see longitudinal plan of care) Comprehensive medication review performed; medication list updated in electronic medical record   Hypertension (BP goal <140/90) -Stable, no updates 12/02/20 -Controlled - per home readings, stable and within goal -Current treatment: Amlodipine 5 mg - 1 tablet daily Lisinopril 30 mg - 1 tablet daily HCTZ 12.5 mg - 1 tablet daily -Medications previously tried: none -Patient confirms daily adherence in pill packs -Uses an arm cuff for at home monitoring -Current home readings: checks daily in morning before coffee or medications 8/15 115/72, 109; 119/60, 99 (recheck) 8/14 140/75, 76 8/13 116/82, 78 8/12 147/82, 78 8/11 140/84, 71; 123/78 (recheck) -Current dietary habits: trying to limit salt, low sodium diet was reviewed by nurse 09/11/20. -Takes morning meds at breakfast, night meds at 8:45 PM -He avoids frequent use of NSAIDs. -Denies hypotensive/hypertensive symptoms -Continue to monitor BP at home daily, document, and provide log at future appointments -Recommended continue current medications   Diabetes (A1c goal <7%) -Uncontrolled - Pt started Jardiance 11/30/20 and increased Levemir from 37 to 40 units on  11/28/20. Fasting BG improving but still elevated. Last A1c 9.1% (08/08/2020). He is feeling much better and cold symptoms have mostly resolved. -Current medications: Levemir - Inject 40 units daily at bedtime Glipizide 5 mg - 2 in the morning and 1 at night Jardiance 10 mg - 1 tablet daily -Affirms taking above medications as prescribed 12/02/20 -Medications previously tried: metformin 500 mg - severe GI upset  -Current home glucose readings  Fasting glucose:  8/22 - 165 8/21 - 219 8/20 - 183 - started Jardiance  this AM 8/19 - 202 8/18 - 200 - increased Levemir to 40 units this PM Post-prandial: none -Current meal patterns:  Meals - 12/01/20 Breakfast - 2 eggs, toast Lunch - Chicken Parmesan from [employer] dog and cookie, [employer] chicken n gravy, [employer] -Spent 10 minutes discussing diabetes plate method. -Recommend increase Levemir from 40 to 43 units daily. Continue Jardiance 10 mg and glipizide. Patient to increase protein and green vegetables. Limit carbs.    Patient Goals/Self-Care Activities Patient will: - take medications as prescribed - check glucose daily, document, and provide at future appointments - check blood pressure once weekly, document, and provide at future appointments  Follow Up Plan: Telephone follow up appointment with care management team member scheduled for:  7 days    Medication Assistance: None required.  Patient affirms current coverage meets needs.   Star Rating Drugs:  Medication:                Last Fill:         Day Supply Lisinopril 30 mg 11/05/20 30 DS Atorvastatin 80 mg  11/05/20 30 DS   Patient's preferred pharmacy is: [facility] Pt received the following in adherence packaging on 11/08/20 to start on 11/10/20.  Packs:  Amlodipine 2.5 mg - 2 tabs at breakfast Atorvastatin 80 mg - 1 evening meal  Glipizide 5 mg - 2 breakfast, 1 evening meal Lisinopril 30 mg - 1 evening meal  Prevagen Regular Strength - 1 at  breakfast  Vials: Jardiance 10 mg - 1 tablet daily  Care Plan and Follow Up Patient Decision:  Patient agrees to Care Plan and Follow-up.  Debbora Dus, PharmD Clinical Pharmacist [place] [facility] at [facility] [phone]

## 2020-12-09 ENCOUNTER — Other Ambulatory Visit: Payer: Self-pay

## 2020-12-09 ENCOUNTER — Ambulatory Visit: Payer: Medicare Other

## 2020-12-09 DIAGNOSIS — E1142 Type 2 diabetes mellitus with diabetic polyneuropathy: Secondary | ICD-10-CM

## 2020-12-09 DIAGNOSIS — I10 Essential (primary) hypertension: Secondary | ICD-10-CM

## 2020-12-09 NOTE — Patient Instructions (Signed)
Dear Christian [name],  Below is a summary of the goals we discussed during our follow up appointment on December 09, 2020. Please contact me anytime with questions or concerns.   Visit Information  Patient Care Plan: CCM Pharmacy Care Plan     Problem Identified: CHL AMB "PATIENT-SPECIFIC PROBLEM"      Long-Range Goal: Disease Management   Start Date: 07/18/2020  This Visit's Progress: On track  Recent Progress: On track  Priority: High  Note:    Current Barriers: Unable to achieve control of diabetes - improving    Pharmacist Clinical Goal(s):  Patient will adhere to plan to optimize therapeutic regimen for diabetes as evidenced by report of adherence to recommended medication management changes through collaboration with PharmD and provider.   Interventions: 1:1 collaboration with Tonia Ghent, MD regarding development and update of comprehensive plan of care as evidenced by provider attestation and co-signature Inter-disciplinary care team collaboration (see longitudinal plan of care) Comprehensive medication review performed; medication list updated in electronic medical record   Hypertension (BP goal <140/90) -Controlled - per home readings, stable and within goal -Current treatment: Amlodipine 5 mg - 1 tablet daily Lisinopril 30 mg - 1 tablet daily HCTZ 12.5 mg - 1 tablet daily -Medications previously tried: none -Patient confirms daily adherence in pill packs -Uses an arm cuff for at home monitoring -Current home readings: checks daily in morning before coffee or medications 12/09/20 - 123/75, 76  12/07/20 - 117/74 -Current dietary habits: trying to limit salt, low sodium diet was reviewed by nurse 09/11/20. -Takes morning meds at breakfast, night meds at 8:45 PM -He avoids frequent use of NSAIDs. -Denies hypotensive/hypertensive symptoms -Continue to monitor BP at home daily, document, and provide log at future appointments -Recommended continue current medications    Diabetes (A1c goal <7%) -Uncontrolled - Pt started Jardiance 11/30/20 and increased Levemir from 40 to 43 units on 12/02/20. Fasting BG improving but still elevated. Last A1c 9.1% (08/08/2020).  -Current medications: Levemir - Inject 43 units daily at bedtime Glipizide 5 mg - 2 in the morning and 1 at night Jardiance 10 mg - 1 tablet daily -Affirms taking above medications as prescribed 12/02/20 -Medications previously tried: metformin 500 mg - severe GI upset  -Current home glucose readings  Fasting glucose: Monday - 8/22 until today -->  165, 205, 176, 164, 201, 151, 157, 166 Post-prandial: none -Current meal patterns: he had fries and bbq sandwich Friday, reports his taste is returning now as he had covid a couple weeks ago; Discussed limiting carbs. -Recommend increase Levemir from 43 to 47 units daily.    Patient Goals/Self-Care Activities Patient will: - take medications as prescribed - check glucose daily, document, and provide at future appointments - check blood pressure once weekly, document, and provide at future appointments  Follow Up Plan: Telephone follow up appointment with care management team member scheduled for:  7 days      Patient verbalizes understanding of instructions provided today and agrees to view in [place].   Debbora Dus, PharmD Clinical Pharmacist [facility] at [facility] [phone]

## 2020-12-09 NOTE — Progress Notes (Signed)
Chronic Care Management  Pharmacy Note  12/09/20 Name:  Christian [name] MRN:  [id number] DOB:  Jun 07, 1940  Summary: Fasting BG continues to improve (150-205). Congratulated patient. Goal is no fasting > 200 this week. He recently started Jardiance 10 mg on 11/30/20. Continues to tolerate well. He increased his Levemir from 40 to 43 units at bedtime on 12/02/20. Continues glipizide 10 mg AM and 5 mg PM. He remains off metformin due to GI upset. No hypoglycemia.   Recommendation:  Fasting BG improved by average ~15 points, still elevated. Recommend increase Levemir by additional 10% from 43 to 47 units at bedtime. Continue Jardiance and glipizide same. Reminded patient to limit breads, pasta, and potatoes.  Plan: Follow up call with pharmacist in 7 days, will update Levemir rx at that time if dose stable.   Subjective: Christian [name] is an 80 y.o. year old male who is a primary patient of Christian [name], Christian Rising, MD.  The CCM team was consulted for assistance with disease management and care coordination needs.    Engaged with patient by telephone for follow up visit in response to provider referral for pharmacy case management   Consent to Services:  The patient was given information about Chronic Care Management services, agreed to services, and gave verbal consent prior to initiation of services.  Please see initial visit note for detailed documentation.   Patient Care Team: Christian Ghent, MD as PCP - General (Family Medicine) Christian [name], [facility] as Pharmacist (Pharmacist)  Recent office visits: 12/02/20 - CCM consult - Increase Levemir from 40 to 43 units daily. 11/28/20 - CCM consult - start Jardiance 10 mg daily, increase Levemir to 37 units. 11/07/20 - Dr. Lorelei [name] - Steroid injection in shoulder  Recent consult visits: None since last CCM contact  Hospital visits: None since last CCM contact  Objective:  Lab Results  Component Value Date   CREATININE 1.10 08/30/2020   BUN 17  08/30/2020   GFR 66.20 08/08/2020   GFRNONAA >60 08/22/2020   GFRAA >60 05/17/2016   NA 143 08/30/2020   K 4.8 08/30/2020   CALCIUM 9.1 08/30/2020   CO2 24 08/30/2020   GLUCOSE 264 (H) 08/30/2020    Lab Results  Component Value Date/Time   HGBA1C 9.1 (H) 08/08/2020 10:03 AM   HGBA1C 9.5 (A) 04/16/2020 09:27 AM   HGBA1C 9.8 (H) 01/04/2020 08:02 AM   GFR 66.20 08/08/2020 10:03 AM   GFR 49.73 (L) 05/09/2020 11:23 AM   MICROALBUR 1.6 01/04/2020 08:02 AM   MICROALBUR 0.7 03/07/2018 09:41 AM    Last diabetic Eye exam:  Lab Results  Component Value Date/Time   HMDIABEYEEXA No Retinopathy 08/30/2020 12:00 AM    Last diabetic Foot exam: 08/08/20  PCP diabetic foot ulcer  Lab Results  Component Value Date   CHOL 85 01/04/2020   HDL 27.70 (L) 01/04/2020   LDLDIRECT 19.0 01/04/2020   TRIG 391.0 (H) 01/04/2020   CHOLHDL 3 01/04/2020    Hepatic Function Latest Ref Rng & Units 08/18/2020 01/04/2020 03/01/2018  Total Protein 6.5 - 8.1 g/dL 6.7 7.3 7.3  Albumin 3.5 - 5.0 g/dL 3.3(L) 4.2 4.3  AST 15 - 41 U/L '23 26 29  ' ALT 0 - 44 U/L '18 19 23  ' Alk Phosphatase 38 - 126 U/L 55 64 74  Total Bilirubin 0.3 - 1.2 mg/dL 0.9 0.9 0.8    Lab Results  Component Value Date/Time   TSH 3.337 03/06/2016 03:45 AM    CBC Latest Ref  Rng & Units 08/30/2020 08/22/2020 08/19/2020  WBC 3.8 - 10.8 Thousand/uL 9.2 8.7 7.2  Hemoglobin 13.2 - 17.1 g/dL 12.4(L) 11.8(L) 10.7(L)  Hematocrit 38.5 - 50.0 % 38.0(L) 36.6(L) 33.6(L)  Platelets 140 - 400 Thousand/uL 222 232 185   Clinical ASCVD: No  The ASCVD Risk score Christian [name] DC Jr., et al., 2013) failed to calculate for the following reasons:   The valid total cholesterol range is 130 to 320 mg/dL    Depression screen [facility] 2/9 04/16/2020 03/08/2018 06/03/2016  Decreased Interest 0 0 0  Down, Depressed, Hopeless 0 0 0  PHQ - 2 Score 0 0 0    Social History   Tobacco Use  Smoking Status Never  Smokeless Tobacco Never   BP Readings from Last 3 Encounters:   11/07/20 120/64  08/30/20 140/68  08/22/20 (!) 150/77   Pulse Readings from Last 3 Encounters:  11/07/20 (!) 57  08/30/20 (!) 104  08/22/20 62   Wt Readings from Last 3 Encounters:  11/07/20 219 lb 8 oz (99.6 kg)  08/30/20 217 lb (98.4 kg)  08/08/20 217 lb (98.4 kg)   BMI Readings from Last 3 Encounters:  11/07/20 28.18 kg/m  08/30/20 27.86 kg/m  08/08/20 27.86 kg/m    Assessment/Interventions: Review of patient past medical history, allergies, medications, health status, including review of consultants reports, laboratory and other test data, was performed as part of comprehensive evaluation and provision of chronic care management services.   SDOH:  (Social Determinants of Health) assessments and interventions performed: No  SDOH Screenings   Alcohol Screen: Not on file  Depression (PHQ2-9): Low Risk    PHQ-2 Score: 0  Financial Resource Strain: Low Risk    Difficulty of Paying Living Expenses: Not very hard  Food Insecurity: Not on file  Housing: Not on file  Physical Activity: Not on file  Social Connections: Not on file  Stress: Not on file  Tobacco Use: Low Risk    Smoking Tobacco Use: Never   Smokeless Tobacco Use: Never  Transportation Needs: Not on file    [place]  No Known Allergies  Medications Reviewed Today     Reviewed by Christian [name], [facility] (Pharmacist) on 12/09/20 at 1206  Med List Status: <None>   Medication Order Taking? Sig Documenting Provider Last Dose Status Informant  amLODipine (NORVASC) 5 MG tablet [id number] No Take 1 tablet (5 mg total) by mouth daily. Christian Ghent, MD Taking Active   atorvastatin (LIPITOR) 80 MG tablet [id number] No Take 1 tablet (80 mg total) by mouth at bedtime. Christian Ghent, MD Taking Active Self  empagliflozin (JARDIANCE) 10 MG TABS tablet [id number] No Take 1 tablet (10 mg total) by mouth daily before breakfast. Christian Ghent, MD Taking Active   glipiZIDE (GLUCOTROL) 5 MG tablet [id number] No  Take 2 tablets by mouth in the morning and 1 tablet by mouth at night. Christian Ghent, MD Taking Active Self  glucose blood [facility] ULTRA) test strip [id number]  USE 1 STRIP TO CHECK GLUCOSE ONCE DAILY Christian Ghent, MD  Active   hydrochlorothiazide (HYDRODIURIL) 12.5 MG tablet [id number]  Take 1 tablet (12.5 mg total) by mouth daily. Christian Ghent, MD  Active   insulin detemir [facility] FLEXTOUCH) 100 UNIT/ML FlexPen [id number] No INJECT UP TO 40 UNITS SUBCUTANEOUSLY ONCE DAILY Christian Ghent, MD Taking Active   Insulin Pen Needle (RELION PEN NEEDLE 31G/8MM) 31G X 8 MM MISC [id number]  USE TO INJECT LEVEMIR DAILY Christian Ghent,  MD  Active   lisinopril (ZESTRIL) 30 MG tablet [id number] No Take 1 tablet (30 mg total) by mouth daily. Christian Ghent, MD Taking Active Self            Patient Active Problem List   Diagnosis Date Noted   Osteomyelitis of fifth toe of right foot ([place])    Diabetic foot infection ([place]) 08/18/2020   Diabetic foot ulcer ([place]) 08/11/2020   Healthcare maintenance 01/10/2020   Lower back pain 09/27/2019   Thumb pain 06/25/2019   Medicare annual wellness visit, subsequent 03/12/2018   Advance care planning 03/12/2018   Shoulder pain 02/03/2017   Edema 11/02/2016   Diabetes mellitus with microalbuminuria ([place]) 06/10/2016   HLD (hyperlipidemia) 06/10/2016   DM type 2 with diabetic peripheral neuropathy ([place])    HTN (hypertension)    Accident caused by farm tractor 01/15/2016   Colon polyps 06/24/2013    Immunization History  Administered Date(s) Administered   Fluad Quad(high Dose 65+) 01/05/2019, 01/09/2020   Influenza,inj,Quad PF,6+ Mos 02/20/2013, 01/03/2014, 02/05/2015, 01/15/2016, 03/26/2016, 02/02/2017   Influenza-Unspecified 04/13/2010, 01/11/2018   PFIZER(Purple Top)SARS-COV-2 Vaccination 05/23/2019, 06/17/2019   Pneumococcal Conjugate-13 01/06/2012, 01/03/2014   Pneumococcal Polysaccharide-23 12/19/2012   Td 05/14/2014   Zoster Recombinat  (Shingrix) 10/27/2018, 01/31/2019   Zoster, Live 12/19/2014    Conditions to be addressed/monitored:  Hypertension and Diabetes  Care Plan : [place]  Updates made by Christian [name], [place] since 12/09/2020 12:00 AM     Problem: CHL AMB "PATIENT-SPECIFIC PROBLEM"      Long-Range Goal: Disease Management   Start Date: 07/18/2020  This Visit's Progress: On track  Recent Progress: On track  Priority: High  Note:    Current Barriers: Unable to achieve control of diabetes - improving    Pharmacist Clinical Goal(s):  Patient will adhere to plan to optimize therapeutic regimen for diabetes as evidenced by report of adherence to recommended medication management changes through collaboration with PharmD and provider.   Interventions: 1:1 collaboration with Christian Ghent, MD regarding development and update of comprehensive plan of care as evidenced by provider attestation and co-signature Inter-disciplinary care team collaboration (see longitudinal plan of care) Comprehensive medication review performed; medication list updated in electronic medical record   Hypertension (BP goal <140/90) -Controlled - per home readings, stable and within goal -Current treatment: Amlodipine 5 mg - 1 tablet daily Lisinopril 30 mg - 1 tablet daily HCTZ 12.5 mg - 1 tablet daily -Medications previously tried: none -Patient confirms daily adherence in pill packs -Uses an arm cuff for at home monitoring -Current home readings: checks daily in morning before coffee or medications 12/09/20 - 123/75, 76  12/07/20 - 117/74 -Current dietary habits: trying to limit salt, low sodium diet was reviewed by nurse 09/11/20. -Takes morning meds at breakfast, night meds at 8:45 PM -He avoids frequent use of NSAIDs. -Denies hypotensive/hypertensive symptoms -Continue to monitor BP at home daily, document, and provide log at future appointments -Recommended continue current medications   Diabetes (A1c  goal <7%) -Uncontrolled - Pt started Jardiance 11/30/20 and increased Levemir from 40 to 43 units on 12/02/20. Fasting BG improving but still elevated. Last A1c 9.1% (08/08/2020).  -Current medications: Levemir - Inject 43 units daily at bedtime Glipizide 5 mg - 2 in the morning and 1 at night Jardiance 10 mg - 1 tablet daily -Affirms taking above medications as prescribed 12/02/20 -Medications previously tried: metformin 500 mg - severe GI upset  -Current home glucose readings  Fasting  glucose: Monday - 8/22 until today -->  165, 205, 176, 164, 201, 151, 157, 166 Post-prandial: none -Current meal patterns: he had fries and bbq sandwich Friday, reports his taste is returning now as he had covid a couple weeks ago; Discussed limiting carbs. -Recommend increase Levemir from 43 to 47 units daily.    Patient Goals/Self-Care Activities Patient will: - take medications as prescribed - check glucose daily, document, and provide at future appointments - check blood pressure once weekly, document, and provide at future appointments  Follow Up Plan: Telephone follow up appointment with care management team member scheduled for:  7 days     Medication Assistance: None required.  Patient affirms current coverage meets needs.   Star Rating Drugs:  Medication:                Last Fill:         Day Supply Lisinopril 30 mg 12/03/20 30 DS Atorvastatin 80 mg  12/03/20 30 DS Jardiance 10 mg  11/28/20  30 DS   Patient's preferred pharmacy is: [facility] Pt received the following in adherence packaging on 12/05/20  Packs:  Amlodipine 2.5 mg - 2 tabs at breakfast Atorvastatin 80 mg - 1 evening meal  Glipizide 5 mg - 2 breakfast, 1 evening meal Lisinopril 30 mg - 1 evening meal  Prevagen Regular Strength - 1 at breakfast  Vials: Jardiance 10 mg - 1 tablet daily  Care Plan and Follow Up Patient Decision:  Patient agrees to Care Plan and Follow-up.  Christian [name], PharmD Clinical  Pharmacist [facility] at [facility] [phone]

## 2020-12-11 DIAGNOSIS — I10 Essential (primary) hypertension: Secondary | ICD-10-CM | POA: Diagnosis not present

## 2020-12-11 DIAGNOSIS — E1142 Type 2 diabetes mellitus with diabetic polyneuropathy: Secondary | ICD-10-CM | POA: Diagnosis not present

## 2020-12-17 ENCOUNTER — Other Ambulatory Visit: Payer: Self-pay

## 2020-12-17 ENCOUNTER — Ambulatory Visit (INDEPENDENT_AMBULATORY_CARE_PROVIDER_SITE_OTHER): Payer: Medicare Other

## 2020-12-17 DIAGNOSIS — E1142 Type 2 diabetes mellitus with diabetic polyneuropathy: Secondary | ICD-10-CM

## 2020-12-17 DIAGNOSIS — I10 Essential (primary) hypertension: Secondary | ICD-10-CM

## 2020-12-17 MED ORDER — LEVEMIR FLEXTOUCH 100 UNIT/ML ~~LOC~~ SOPN: PEN_INJECTOR | SUBCUTANEOUS | 3 refills | Status: DC

## 2020-12-17 NOTE — Progress Notes (Signed)
Chronic Care Management  Pharmacy Note  12/09/20 Name:  Christian [name] MRN:  [id number] DOB:  1940/07/31  Summary: Fasting BG continues to improve (127-177 this week). He is feeling a lot better, no upset stomach. Congratulated patient. Reached goal of no fasting above 200 this week. He continues Jardiance 10 mg started on 11/30/20 without known side effects. He did not increase his Levemir from 43 to 47 units as recommended 8/29, instead he increased to 44 units. Continues glipizide 10 mg AM and 5 mg PM. He remains off metformin due to GI upset. His BP remains well controlled at home.  Recommendation:  Fasting BG continues to improve. Set new goal of all BG < 150 this week. Recommend increase Levemir by additional 10% from 44 to 47 units at bedtime. Continue Jardiance and glipizide same. Reminded patient to limit breads, pasta, and potatoes.  Plan:  -Follow up call with pharmacist in 7 days -CPP send updated Levemir prescription to [facility] with dose change (up to 50 units daily) as patient is running low  Subjective: Christian [name] is an 80 y.o. year old male who is a primary patient of [name], [name], MD.  The CCM team was consulted for assistance with disease management and care coordination needs.    Engaged with patient by telephone for follow up visit in response to provider referral for pharmacy case management   Consent to Services:  The patient was given information about Chronic Care Management services, agreed to services, and gave verbal consent prior to initiation of services.  Please see initial visit note for detailed documentation.   Patient Care Team: Tonia Ghent, MD as PCP - General (Family Medicine) Debbora Dus, [facility] as Pharmacist (Pharmacist)  Recent office visits: 12/09/20 - CCM consult - Increase Levemir from 43 to 47 units daily. 12/02/20 - CCM consult - Increase Levemir from 40 to 43 units daily. 11/28/20 - CCM consult - start Jardiance 10 mg daily, increase  Levemir to 37 units. 11/07/20 - Dr. Lorelei Pont - Steroid injection in shoulder  Recent consult visits: None since last CCM contact  Hospital visits: None since last CCM contact  Objective:  Lab Results  Component Value Date   CREATININE 1.10 08/30/2020   BUN 17 08/30/2020   GFR 66.20 08/08/2020   GFRNONAA >60 08/22/2020   GFRAA >60 05/17/2016   NA 143 08/30/2020   K 4.8 08/30/2020   CALCIUM 9.1 08/30/2020   CO2 24 08/30/2020   GLUCOSE 264 (H) 08/30/2020    Lab Results  Component Value Date/Time   HGBA1C 9.1 (H) 08/08/2020 10:03 AM   HGBA1C 9.5 (A) 04/16/2020 09:27 AM   HGBA1C 9.8 (H) 01/04/2020 08:02 AM   GFR 66.20 08/08/2020 10:03 AM   GFR 49.73 (L) 05/09/2020 11:23 AM   MICROALBUR 1.6 01/04/2020 08:02 AM   MICROALBUR 0.7 03/07/2018 09:41 AM    Last diabetic Eye exam:  Lab Results  Component Value Date/Time   HMDIABEYEEXA No Retinopathy 08/30/2020 12:00 AM    Last diabetic Foot exam: 08/08/20  PCP diabetic foot ulcer  Lab Results  Component Value Date   CHOL 85 01/04/2020   HDL 27.70 (L) 01/04/2020   LDLDIRECT 19.0 01/04/2020   TRIG 391.0 (H) 01/04/2020   CHOLHDL 3 01/04/2020    Hepatic Function Latest Ref Rng & Units 08/18/2020 01/04/2020 03/01/2018  Total Protein 6.5 - 8.1 g/dL 6.7 7.3 7.3  Albumin 3.5 - 5.0 g/dL 3.3(L) 4.2 4.3  AST 15 - 41 U/L 23 26  29  ALT 0 - 44 U/L '18 19 23  ' Alk Phosphatase 38 - 126 U/L 55 64 74  Total Bilirubin 0.3 - 1.2 mg/dL 0.9 0.9 0.8    Lab Results  Component Value Date/Time   TSH 3.337 03/06/2016 03:45 AM    CBC Latest Ref Rng & Units 08/30/2020 08/22/2020 08/19/2020  WBC 3.8 - 10.8 Thousand/uL 9.2 8.7 7.2  Hemoglobin 13.2 - 17.1 g/dL 12.4(L) 11.8(L) 10.7(L)  Hematocrit 38.5 - 50.0 % 38.0(L) 36.6(L) 33.6(L)  Platelets 140 - 400 Thousand/uL 222 232 185   Clinical ASCVD: No  The ASCVD Risk score Mikey Bussing DC Jr., et al., 2013) failed to calculate for the following reasons:   The valid total cholesterol range is 130 to 320 mg/dL     Depression screen [facility] 2/9 04/16/2020 03/08/2018 06/03/2016  Decreased Interest 0 0 0  Down, Depressed, Hopeless 0 0 0  PHQ - 2 Score 0 0 0    Social History   Tobacco Use  Smoking Status Never  Smokeless Tobacco Never   BP Readings from Last 3 Encounters:  11/07/20 120/64  08/30/20 140/68  08/22/20 (!) 150/77   Pulse Readings from Last 3 Encounters:  11/07/20 (!) 57  08/30/20 (!) 104  08/22/20 62   Wt Readings from Last 3 Encounters:  11/07/20 219 lb 8 oz (99.6 kg)  08/30/20 217 lb (98.4 kg)  08/08/20 217 lb (98.4 kg)   BMI Readings from Last 3 Encounters:  11/07/20 28.18 kg/m  08/30/20 27.86 kg/m  08/08/20 27.86 kg/m    Assessment/Interventions: Review of patient past medical history, allergies, medications, health status, including review of consultants reports, laboratory and other test data, was performed as part of comprehensive evaluation and provision of chronic care management services.   SDOH:  (Social Determinants of Health) assessments and interventions performed: No  SDOH Screenings   Alcohol Screen: Not on file  Depression (PHQ2-9): Low Risk    PHQ-2 Score: 0  Financial Resource Strain: Low Risk    Difficulty of Paying Living Expenses: Not very hard  Food Insecurity: Not on file  Housing: Not on file  Physical Activity: Not on file  Social Connections: Not on file  Stress: Not on file  Tobacco Use: Low Risk    Smoking Tobacco Use: Never   Smokeless Tobacco Use: Never  Transportation Needs: Not on file    [place]  No Known Allergies  Medications Reviewed Today     Reviewed by Debbora Dus, [facility] (Pharmacist) on 12/09/20 at 1206  Med List Status: <None>   Medication Order Taking? Sig Documenting Provider Last Dose Status Informant  amLODipine (NORVASC) 5 MG tablet [id number] No Take 1 tablet (5 mg total) by mouth daily. Tonia Ghent, MD Taking Active   atorvastatin (LIPITOR) 80 MG tablet [id number] No Take 1 tablet (80 mg total)  by mouth at bedtime. Tonia Ghent, MD Taking Active Self  empagliflozin (JARDIANCE) 10 MG TABS tablet [id number] No Take 1 tablet (10 mg total) by mouth daily before breakfast. Tonia Ghent, MD Taking Active   glipiZIDE (GLUCOTROL) 5 MG tablet [id number] No Take 2 tablets by mouth in the morning and 1 tablet by mouth at night. Tonia Ghent, MD Taking Active Self  glucose blood [facility] ULTRA) test strip [id number]  USE 1 STRIP TO CHECK GLUCOSE ONCE DAILY Tonia Ghent, MD  Active   hydrochlorothiazide (HYDRODIURIL) 12.5 MG tablet [id number]  Take 1 tablet (12.5 mg total) by mouth daily. Tonia Ghent,  MD  Active   insulin detemir (LEVEMIR FLEXTOUCH) 100 UNIT/ML FlexPen [id number] No INJECT UP TO 40 UNITS SUBCUTANEOUSLY ONCE DAILY Tonia Ghent, MD Taking Active   Insulin Pen Needle (RELION PEN NEEDLE 31G/8MM) 31G X 8 MM MISC [id number]  USE TO INJECT LEVEMIR DAILY Tonia Ghent, MD  Active   lisinopril (ZESTRIL) 30 MG tablet [id number] No Take 1 tablet (30 mg total) by mouth daily. Tonia Ghent, MD Taking Active Self            Patient Active Problem List   Diagnosis Date Noted   Osteomyelitis of fifth toe of right foot [facility])    Diabetic foot infection ([place]) 08/18/2020   Diabetic foot ulcer ([place]) 08/11/2020   Healthcare maintenance 01/10/2020   Lower back pain 09/27/2019   Thumb pain 06/25/2019   Medicare annual wellness visit, subsequent 03/12/2018   Advance care planning 03/12/2018   Shoulder pain 02/03/2017   Edema 11/02/2016   Diabetes mellitus with microalbuminuria ([place]) 06/10/2016   HLD (hyperlipidemia) 06/10/2016   DM type 2 with diabetic peripheral neuropathy ([place])    HTN (hypertension)    Accident caused by farm tractor 01/15/2016   Colon polyps 06/24/2013    Immunization History  Administered Date(s) Administered   Fluad Quad(high Dose 65+) 01/05/2019, 01/09/2020   Influenza,inj,Quad PF,6+ Mos 02/20/2013, 01/03/2014, 02/05/2015, 01/15/2016,  03/26/2016, 02/02/2017   Influenza-Unspecified 04/13/2010, 01/11/2018   PFIZER(Purple Top)SARS-COV-2 Vaccination 05/23/2019, 06/17/2019   Pneumococcal Conjugate-13 01/06/2012, 01/03/2014   Pneumococcal Polysaccharide-23 12/19/2012   Td 05/14/2014   Zoster Recombinat (Shingrix) 10/27/2018, 01/31/2019   Zoster, Live 12/19/2014    Conditions to be addressed/monitored:  Hypertension and Diabetes  There are no care plans that you recently modified to display for this patient.  Current Barriers: Unable to achieve control of diabetes - improving    Pharmacist Clinical Goal(s):  Patient will adhere to plan to optimize therapeutic regimen for diabetes as evidenced by report of adherence to recommended medication management changes through collaboration with PharmD and provider.   Interventions: 1:1 collaboration with Tonia Ghent, MD regarding development and update of comprehensive plan of care as evidenced by provider attestation and co-signature Inter-disciplinary care team collaboration (see longitudinal plan of care) Comprehensive medication review performed; medication list updated in electronic medical record   Hypertension (BP goal <140/90) -Controlled - per home readings, stable and within goal -Current treatment: Amlodipine 5 mg - 1 tablet daily Lisinopril 30 mg - 1 tablet daily HCTZ 12.5 mg - 1 tablet daily -Medications previously tried: none -Patient confirms daily adherence in pill packs -Uses an arm cuff for at home monitoring -Current home readings: checks daily in morning before coffee or medications See below in DM section -Current dietary habits: trying to limit salt, low sodium diet was reviewed by nurse 09/11/20. -Takes morning meds at breakfast, night meds at 8:45 PM -He avoids frequent use of NSAIDs. -Denies hypotensive/hypertensive symptoms -Continue to monitor BP at home daily, document, and provide log at future appointments -Recommended continue current  medications   Diabetes (A1c goal <7%) -Uncontrolled - Pt started Jardiance 11/30/20 and increased Levemir from 43 to 44 units on 12/09/20. Fasting BG improving but still elevated. Last A1c 9.1% (08/08/2020). Goal for this week is all fasting BG < 150. -Current medications: Levemir - Inject 44 units daily at bedtime Glipizide 5 mg - 2 in the morning and 1 at night Jardiance 10 mg - 1 tablet daily -Affirms taking above medications as prescribed 12/02/20 -Medications previously tried: metformin  500 mg - severe GI upset  -Current home glucose readings  Fasting BG/Blood pressure in morning:  8/30 - 155,  114/80, 79 8/31 - 172, 115/82, 78  9/1 - 160  133/80, 80 9/2 - 127  125/75 9/3 - 177  14/80, 80 9/4 - 174  117/82, 79 9/5 - 160, 114/71, 78 9/6 - 165,  137/74, 75 -Current meal patterns: he went to festival this weekend and ate ice cream -Recommend increase Levemir from 44 to 47 units daily.    Patient Goals/Self-Care Activities Patient will: - take medications as prescribed - check glucose daily, document, and provide at future appointments - check blood pressure once weekly, document, and provide at future appointments  Follow Up Plan: Telephone follow up appointment with care management team member scheduled for:  7 days  Medication Assistance: None required.  Patient affirms current coverage meets needs.   Star Rating Drugs:  Medication:                Last Fill:         Day Supply Lisinopril 30 mg 12/03/20 30 DS Atorvastatin 80 mg  12/03/20 30 DS Jardiance 10 mg  11/28/20  30 DS   Patient's preferred pharmacy is: [facility] Pt received the following in adherence packaging on 12/05/20  Packs:  Amlodipine 2.5 mg - 2 tabs at breakfast Atorvastatin 80 mg - 1 evening meal  Glipizide 5 mg - 2 breakfast, 1 evening meal Lisinopril 30 mg - 1 evening meal  Prevagen Regular Strength - 1 at breakfast  Vials: Jardiance 10 mg - 1 tablet daily  Care Plan and Follow Up Patient  Decision:  Patient agrees to Care Plan and Follow-up.  Debbora Dus, PharmD Clinical Pharmacist [facility] at [facility] [phone]

## 2020-12-17 NOTE — Patient Instructions (Signed)
Dear [name],  Below is a summary of the goals we discussed during our follow up appointment on December 17, 2020. Please contact me anytime with questions or concerns.   Visit Information  Patient Care Plan: CCM Pharmacy Care Plan     Problem Identified: CHL AMB "PATIENT-SPECIFIC PROBLEM"      Long-Range Goal: Disease Management   Start Date: 07/18/2020  This Visit's Progress: On track  Recent Progress: On track  Priority: High  Note:    Current Barriers: Unable to achieve control of diabetes - improving    Pharmacist Clinical Goal(s):  Patient will adhere to plan to optimize therapeutic regimen for diabetes as evidenced by report of adherence to recommended medication management changes through collaboration with PharmD and provider.   Interventions: 1:1 collaboration with Tonia Ghent, MD regarding development and update of comprehensive plan of care as evidenced by provider attestation and co-signature Inter-disciplinary care team collaboration (see longitudinal plan of care) Comprehensive medication review performed; medication list updated in electronic medical record   Hypertension (BP goal <140/90) -Controlled - per home readings, stable and within goal -Current treatment: Amlodipine 5 mg - 1 tablet daily Lisinopril 30 mg - 1 tablet daily HCTZ 12.5 mg - 1 tablet daily -Medications previously tried: none -Patient confirms daily adherence in pill packs -Uses an arm cuff for at home monitoring -Current home readings: checks daily in morning before coffee or medications 12/09/20 - 123/75, 76  12/07/20 - 117/74 -Current dietary habits: trying to limit salt, low sodium diet was reviewed by nurse 09/11/20. -Takes morning meds at breakfast, night meds at 8:45 PM -He avoids frequent use of NSAIDs. -Denies hypotensive/hypertensive symptoms -Continue to monitor BP at home daily, document, and provide log at future appointments -Recommended continue current  medications   Diabetes (A1c goal <7%) -Uncontrolled - Pt started Jardiance 11/30/20 and increased Levemir from 40 to 43 units on 12/02/20. Fasting BG improving but still elevated. Last A1c 9.1% (08/08/2020).  -Current medications: Levemir - Inject 43 units daily at bedtime Glipizide 5 mg - 2 in the morning and 1 at night Jardiance 10 mg - 1 tablet daily -Affirms taking above medications as prescribed 12/02/20 -Medications previously tried: metformin 500 mg - severe GI upset  -Current home glucose readings  Fasting glucose: Monday - 8/22 until today -->  165, 205, 176, 164, 201, 151, 157, 166 Post-prandial: none -Current meal patterns: he had fries and bbq sandwich Friday, reports his taste is returning now as he had covid a couple weeks ago; Discussed limiting carbs. -Recommend increase Levemir from 43 to 47 units daily.    Patient Goals/Self-Care Activities Patient will: - take medications as prescribed - check glucose daily, document, and provide at future appointments - check blood pressure once weekly, document, and provide at future appointments  Follow Up Plan: Telephone follow up appointment with care management team member scheduled for:  7 days      Patient verbalizes understanding of instructions provided today and agrees to view in [place].   Debbora Dus, PharmD Clinical Pharmacist [facility] at [facility] [phone]

## 2020-12-25 ENCOUNTER — Telehealth: Payer: Self-pay

## 2020-12-25 NOTE — Progress Notes (Addendum)
Chronic Care Management Pharmacy Assistant   Name: Christian [name]  MRN: [id number] DOB: Aug 22, 1940  Reason for Encounter: Diabetes Disease State   Recent office visits:  None since last CCM contact  Recent consult visits:  None since last CCM contact  Hospital visits:  None in previous 6 months  Medications: Outpatient Encounter Medications as of 12/25/2020  Medication Sig   amLODipine (NORVASC) 5 MG tablet Take 1 tablet (5 mg total) by mouth daily.   atorvastatin (LIPITOR) 80 MG tablet Take 1 tablet (80 mg total) by mouth at bedtime.   empagliflozin (JARDIANCE) 10 MG TABS tablet Take 1 tablet (10 mg total) by mouth daily before breakfast.   glipiZIDE (GLUCOTROL) 5 MG tablet Take 2 tablets by mouth in the morning and 1 tablet by mouth at night.   glucose blood (ONETOUCH ULTRA) test strip USE 1 STRIP TO CHECK GLUCOSE ONCE DAILY   hydrochlorothiazide (HYDRODIURIL) 12.5 MG tablet Take 1 tablet (12.5 mg total) by mouth daily.   insulin detemir (LEVEMIR FLEXTOUCH) 100 UNIT/ML FlexPen INJECT UP TO 50 UNITS SUBCUTANEOUSLY ONCE DAILY   Insulin Pen Needle (RELION PEN NEEDLE 31G/8MM) 31G X 8 MM MISC USE TO INJECT LEVEMIR DAILY   lisinopril (ZESTRIL) 30 MG tablet Take 1 tablet (30 mg total) by mouth daily.   No facility-administered encounter medications on file as of 12/25/2020.    Recent Relevant Labs: Lab Results  Component Value Date/Time   HGBA1C 9.1 (H) 08/08/2020 10:03 AM   HGBA1C 9.5 (A) 04/16/2020 09:27 AM   HGBA1C 9.8 (H) 01/04/2020 08:02 AM   MICROALBUR 1.6 01/04/2020 08:02 AM   MICROALBUR 0.7 03/07/2018 09:41 AM    Kidney Function Lab Results  Component Value Date/Time   CREATININE 1.10 08/30/2020 03:00 PM   CREATININE 1.15 08/22/2020 01:18 AM   CREATININE 1.20 08/20/2020 01:55 AM   GFR 66.20 08/08/2020 10:03 AM   GFRNONAA >60 08/22/2020 01:18 AM   GFRAA >60 05/17/2016 06:59 AM   Contacted patient on 12/25/2020 to discuss diabetes disease state.   Current  antihyperglycemic regimen:  Levemir - Inject 47 units daily at bedtime Glipizide 5 mg - 2 in the morning and 1 at night Jardiance 10 mg - 1 tablet daily   Patient verbally confirms he is taking the above medications as directed. Yes  What diet changes have been made to improve diabetes control? Patient states he is watching the sugar he eats. He had some cookies two days ago and his sugar the next morning was 227.   What recent interventions/DTPs have been made to improve glycemic control:  Patient started Jardiance 11/30/20 and increased Levemir from 43 to 44 units on 12/09/20. Patient will check glucose daily, document, and provide at future appointments  Have there been any recent hospitalizations or ED visits since last visit with CPP? No  Patient denies hypoglycemic symptoms, including Pale, Sweaty, Shaky, Hungry, Nervous/irritable, and Vision changes  Patient denies hyperglycemic symptoms, including blurry vision, excessive thirst, fatigue, polyuria, and weakness  How often are you checking your blood sugar? in the morning before eating or drinking  What are your blood sugars ranging? Fasting each morning  12/25/2020 - 166  12/24/2020 - 227 (ate 2 cookies night before)  12/23/2020 - 183  12/22/2020 - 144  12/21/2020 - 166  During the week, how often does your blood glucose drop below 70? Never  Are you checking your feet daily/regularly? Yes  Adherence Review: Is the patient currently on a STATIN medication? Yes Is  the patient currently on ACE/ARB medication? Yes Does the patient have >5 day gap between last estimated fill dates? No  Care Gaps: Annual wellness visit in last year? No 03/12/2018 Most recent A1C reading: 9.1 on 08/08/2020 Most Recent BP reading: 148/84 on 07/03/2020  Last eye exam / retinopathy screening: 08/30/2020 Last diabetic foot exam: 08/08/2020  Star Rating Drugs:  Medication:  Last Fill: Day Supply Lisinopril 30 mg           12/03/20            30  DS Atorvastatin 80 mg      12/03/20            30 DS Jardiance 10 mg         11/28/20            30 DS  No appointments scheduled within the next 30 days.  Debbora Dus, CPP notified  [name], [place] Clinical Pharmacy Assistant [phone]  I have reviewed the care management and care coordination activities outlined in this encounter and I am certifying that I agree with the content of this note. Scheduled CCM diabetes follow up - October 2022.  Debbora Dus, PharmD Clinical Pharmacist [place] [facility] at [facility] [phone]

## 2020-12-26 DIAGNOSIS — M47816 Spondylosis without myelopathy or radiculopathy, lumbar region: Secondary | ICD-10-CM | POA: Insufficient documentation

## 2020-12-27 ENCOUNTER — Telehealth: Payer: Self-pay

## 2020-12-27 NOTE — Chronic Care Management (AMB) (Addendum)
Chronic Care Management Pharmacy Assistant   Name: Christian [name]  MRN: [id number] DOB: 11/14/1940   Reason for Encounter: Medication Adherence and Delivery Coordination   Recent office visits:  None since last CCM contact  Recent consult visits:  None since last CCM contact  Hospital visits:  None in previous 6 months  Medications: Outpatient Encounter Medications as of 12/27/2020  Medication Sig   amLODipine (NORVASC) 5 MG tablet Take 1 tablet (5 mg total) by mouth daily.   atorvastatin (LIPITOR) 80 MG tablet Take 1 tablet (80 mg total) by mouth at bedtime.   empagliflozin (JARDIANCE) 10 MG TABS tablet Take 1 tablet (10 mg total) by mouth daily before breakfast.   glipiZIDE (GLUCOTROL) 5 MG tablet Take 2 tablets by mouth in the morning and 1 tablet by mouth at night.   glucose blood (ONETOUCH ULTRA) test strip USE 1 STRIP TO CHECK GLUCOSE ONCE DAILY   hydrochlorothiazide (HYDRODIURIL) 12.5 MG tablet Take 1 tablet (12.5 mg total) by mouth daily.   insulin detemir (LEVEMIR FLEXTOUCH) 100 UNIT/ML FlexPen INJECT UP TO 50 UNITS SUBCUTANEOUSLY ONCE DAILY   Insulin Pen Needle (RELION PEN NEEDLE 31G/8MM) 31G X 8 MM MISC USE TO INJECT LEVEMIR DAILY   lisinopril (ZESTRIL) 30 MG tablet Take 1 tablet (30 mg total) by mouth daily.   No facility-administered encounter medications on file as of 12/27/2020.   BP Readings from Last 3 Encounters:  11/07/20 120/64  08/30/20 140/68  08/22/20 (!) 150/77    Lab Results  Component Value Date   HGBA1C 9.1 (H) 08/08/2020      Recent OV, Consult or Hospital visit:  Ongoing telephone follow up CCM . Recent medication changes indicated:  11/25/20-started Jardiance 10 mg 1 tablet in morning.    Last adherence delivery date:11/10/20      Patient is due for next adherence delivery on: 01/07/21  Spoke with patient on 12/27/20 reviewed medications and coordinated delivery.  This delivery to include: Adherence Packaging  30 Days   Packs: Lisinopril '30mg'$  take 1 tablet evening meal Glipizide 5 mg - 2 tablets breakfast,1 tablet evening meal  Atorvastatin '80mg'$  take 1 tablet evening meal Amlodipine '5mg'$  take 1 tablet breakfast Prevagen chew 30 take 1 tablet breakfast Hydrochlorothiazide 12.'5mg'$  take 1 tablet breakfast  VIAL medications: Levemir - Inject 47 units daily at bedtime  Patient declined the following medications this month: Trueplus Pen Needles (patient has supply) Onetouch ulrtra test strips (patient has supply) Jardiance 10 mg - 1 tablet morning (pt will run out on 01/07/21, unable to afford copay. PAP pending)  Any concerns about your medications? Yes - Jardiance cost   How often do you forget or accidentally miss a dose? Rarely  Is patient in packaging Yes  If yes  What is the date on your next pill pack? Unknown   Any concerns or issues with your packaging? Getting used to the packs.   Refills requested from providers include: Lisinopril  Confirmed delivery date of 01/07/21, advised patient that pharmacy will contact them the morning of delivery.  Recent blood pressure readings are as follows: patient reports  12/27/20 -121/66  64-P  Recent blood glucose readings are as follows: Fasting:  118 for 3 mornings, 216 yesterday am, the patient reports he has fried chicken, potato salad, hush puppies and 2 cookies for his last meal the night before this high reading  Annual wellness visit in last year?   No  03/12/2018 Most Recent BP reading:120/64 57-P (11/07/20)  If  Diabetic: Most recent A1C reading: 9.1 (08/08/20) Last eye exam / retinopathy screening: 08/30/20 Last diabetic foot exam: 08/08/20  Debbora Dus, CPP notified  Avel Sensor, [place] Assistant [phone]  I have reviewed the care management and care coordination activities outlined in this encounter and I am certifying that I agree with the content of this note. We will mail his PAP forms. Patient  aware.  Debbora Dus, PharmD Clinical Pharmacist [place] [facility] at [facility] [phone]

## 2021-01-06 DIAGNOSIS — M5116 Intervertebral disc disorders with radiculopathy, lumbar region: Secondary | ICD-10-CM | POA: Diagnosis not present

## 2021-01-06 DIAGNOSIS — M5416 Radiculopathy, lumbar region: Secondary | ICD-10-CM | POA: Diagnosis not present

## 2021-01-08 ENCOUNTER — Telehealth: Payer: Self-pay

## 2021-01-08 NOTE — Progress Notes (Addendum)
Patient assistance forms for Jardiance mailed to the patient to sign and mail back to office. The manufacturer is [employer].  Debbora Dus, CPP notified  Avel Sensor, [place] Assistant [phone]  I have reviewed the care management and care coordination activities outlined in this encounter and I am certifying that I agree with the content of this note. No further action required.  Debbora Dus, PharmD Clinical Pharmacist [facility] at [facility] [phone]

## 2021-01-10 DIAGNOSIS — I10 Essential (primary) hypertension: Secondary | ICD-10-CM

## 2021-01-10 DIAGNOSIS — E1142 Type 2 diabetes mellitus with diabetic polyneuropathy: Secondary | ICD-10-CM | POA: Diagnosis not present

## 2021-01-13 ENCOUNTER — Telehealth: Payer: Medicare Other

## 2021-01-14 ENCOUNTER — Ambulatory Visit (INDEPENDENT_AMBULATORY_CARE_PROVIDER_SITE_OTHER): Payer: Medicare Other

## 2021-01-14 ENCOUNTER — Ambulatory Visit: Payer: Medicare Other | Admitting: Podiatry

## 2021-01-14 ENCOUNTER — Other Ambulatory Visit: Payer: Self-pay

## 2021-01-14 DIAGNOSIS — E11621 Type 2 diabetes mellitus with foot ulcer: Secondary | ICD-10-CM

## 2021-01-14 DIAGNOSIS — I10 Essential (primary) hypertension: Secondary | ICD-10-CM

## 2021-01-14 DIAGNOSIS — L97521 Non-pressure chronic ulcer of other part of left foot limited to breakdown of skin: Secondary | ICD-10-CM

## 2021-01-14 DIAGNOSIS — E1142 Type 2 diabetes mellitus with diabetic polyneuropathy: Secondary | ICD-10-CM

## 2021-01-14 NOTE — Progress Notes (Signed)
Chronic Care Management  Pharmacy Note  01/14/21 Name:  Christian [name] MRN:  [id number] DOB:  03/11/1941  Summary: Diabetes follow up. BG has been elevated. Fasting 211-377, Steroid injection 01/06/21, Dr. Ellene Route. Starting to trend downward. Also eating more this week due to family wedding. He continues Jardiance 10 mg  despite cost limitations. He has PAP forms but has not signed yet. Taking Levemir from 50 units daily (increased dose last night from 47 to 50) and glipizide 10 mg AM and 5 mg PM.   Recommendation:  Recommend continue all meds same, will work on diet and keep monitoring sugars given recent steroid injection.   Plan:  -Follow up call with pharmacist in 2 weeks   Subjective: Christian [name] is an 80 y.o. year old male who is a primary patient of [name], [name], MD.  The CCM team was consulted for assistance with disease management and care coordination needs.    Engaged with patient by telephone for follow up visit in response to provider referral for pharmacy case management   Consent to Services:  The patient was given information about Chronic Care Management services, agreed to services, and gave verbal consent prior to initiation of services.  Please see initial visit note for detailed documentation.   Patient Care Team: Tonia Ghent, MD as PCP - General (Family Medicine) Debbora Dus, [facility] as Pharmacist (Pharmacist)  Recent office visits: 12/15/20 - CCM visit - Increase Levemir from 44 to 47 units   Recent consult visits: None since last CCM contact  Hospital visits: None since last CCM contact  Objective:  Lab Results  Component Value Date   CREATININE 1.10 08/30/2020   BUN 17 08/30/2020   GFR 66.20 08/08/2020   GFRNONAA >60 08/22/2020   GFRAA >60 05/17/2016   NA 143 08/30/2020   K 4.8 08/30/2020   CALCIUM 9.1 08/30/2020   CO2 24 08/30/2020   GLUCOSE 264 (H) 08/30/2020    Lab Results  Component Value Date/Time   HGBA1C 9.1 (H) 08/08/2020  10:03 AM   HGBA1C 9.5 (A) 04/16/2020 09:27 AM   HGBA1C 9.8 (H) 01/04/2020 08:02 AM   GFR 66.20 08/08/2020 10:03 AM   GFR 49.73 (L) 05/09/2020 11:23 AM   MICROALBUR 1.6 01/04/2020 08:02 AM   MICROALBUR 0.7 03/07/2018 09:41 AM    Last diabetic Eye exam:  Lab Results  Component Value Date/Time   HMDIABEYEEXA No Retinopathy 08/30/2020 12:00 AM    Last diabetic Foot exam: 08/08/20  PCP diabetic foot ulcer  Lab Results  Component Value Date   CHOL 85 01/04/2020   HDL 27.70 (L) 01/04/2020   LDLDIRECT 19.0 01/04/2020   TRIG 391.0 (H) 01/04/2020   CHOLHDL 3 01/04/2020    Hepatic Function Latest Ref Rng & Units 08/18/2020 01/04/2020 03/01/2018  Total Protein 6.5 - 8.1 g/dL 6.7 7.3 7.3  Albumin 3.5 - 5.0 g/dL 3.3(L) 4.2 4.3  AST 15 - 41 U/L _0 ALT 0 - 44 U/L _1 Alk Phosphatase 38 - 126 U/L 55 64 74  Total Bilirubin 0.3 - 1.2 mg/dL 0.9 0.9 0.8    Lab Results  Component Value Date/Time   TSH 3.337 03/06/2016 03:45 AM    CBC Latest Ref Rng & Units 08/30/2020 08/22/2020 08/19/2020  WBC 3.8 - 10.8 Thousand/uL 9.2 8.7 7.2  Hemoglobin 13.2 - 17.1 g/dL 12.4(L) 11.8(L) 10.7(L)  Hematocrit 38.5 - 50.0 % 38.0(L) 36.6(L) 33.6(L)  Platelets 140 - 400 Thousand/uL 222 232 185  Clinical ASCVD: No  The ASCVD Risk score (Arnett DK, et al., 2019) failed to calculate for the following reasons:   The valid total cholesterol range is 130 to 320 mg/dL    Depression screen [facility] 2/9 04/16/2020 03/08/2018 06/03/2016  Decreased Interest 0 0 0  Down, Depressed, Hopeless 0 0 0  PHQ - 2 Score 0 0 0    Social History   Tobacco Use  Smoking Status Never  Smokeless Tobacco Never   BP Readings from Last 3 Encounters:  11/07/20 120/64  08/30/20 140/68  08/22/20 (!) 150/77   Pulse Readings from Last 3 Encounters:  11/07/20 (!) 57  08/30/20 (!) 104  08/22/20 62   Wt Readings from Last 3 Encounters:  11/07/20 219 lb 8 oz (99.6 kg)  08/30/20 217 lb (98.4 kg)  08/08/20 217 lb (98.4 kg)    BMI Readings from Last 3 Encounters:  11/07/20 28.18 kg/m  08/30/20 27.86 kg/m  08/08/20 27.86 kg/m    Assessment/Interventions: Review of patient past medical history, allergies, medications, health status, including review of consultants reports, laboratory and other test data, was performed as part of comprehensive evaluation and provision of chronic care management services.   SDOH:  (Social Determinants of Health) assessments and interventions performed: No  SDOH Screenings   Alcohol Screen: Not on file  Depression (PHQ2-9): Low Risk    PHQ-2 Score: 0  Financial Resource Strain: Low Risk    Difficulty of Paying Living Expenses: Not very hard  Food Insecurity: Not on file  Housing: Not on file  Physical Activity: Not on file  Social Connections: Not on file  Stress: Not on file  Tobacco Use: Low Risk    Smoking Tobacco Use: Never   Smokeless Tobacco Use: Never  Transportation Needs: Not on file    [place]  No Known Allergies  Medications Reviewed Today     Reviewed by Debbora Dus, [facility] (Pharmacist) on 01/29/21 at [place] List Status: <None>   Medication Order Taking? Sig Documenting Provider Last Dose Status Informant  amLODipine (NORVASC) 5 MG tablet [id number] No Take 1 tablet (5 mg total) by mouth daily. Tonia Ghent, MD Taking Active   atorvastatin (LIPITOR) 80 MG tablet [id number] No Take 1 tablet (80 mg total) by mouth at bedtime. Tonia Ghent, MD Taking Active Self  empagliflozin (JARDIANCE) 10 MG TABS tablet [id number] No Take 1 tablet (10 mg total) by mouth daily before breakfast. Tonia Ghent, MD Taking Active   glipiZIDE (GLUCOTROL) 5 MG tablet [id number] No Take 2 tablets by mouth in the morning and 1 tablet by mouth at night. Tonia Ghent, MD Taking Active Self  glucose blood [facility] ULTRA) test strip [id number] No USE 1 STRIP TO CHECK GLUCOSE ONCE DAILY Tonia Ghent, MD Taking Active   hydrochlorothiazide (HYDRODIURIL)  12.5 MG tablet [id number] No Take 1 tablet (12.5 mg total) by mouth daily. Tonia Ghent, MD Taking Active   insulin detemir [facility]) 100 UNIT/ML FlexPen [id number]  INJECT UP TO 50 UNITS SUBCUTANEOUSLY ONCE DAILY Tonia Ghent, MD  Active   Insulin Pen Needle (RELION PEN NEEDLE 31G/8MM) 31G X 8 MM MISC [id number] No USE TO INJECT LEVEMIR DAILY Tonia Ghent, MD Taking Active   lisinopril (ZESTRIL) 30 MG tablet [id number]  TAKE ONE TABLET BY MOUTH EVERY EVENING Tonia Ghent, MD  Active             Patient Active Problem List   Diagnosis Date  Noted   Osteomyelitis of fifth toe of right foot ([place])    Diabetic foot infection ([place]) 08/18/2020   Diabetic foot ulcer ([place]) 08/11/2020   Healthcare maintenance 01/10/2020   Lower back pain 09/27/2019   Thumb pain 06/25/2019   Medicare annual wellness visit, subsequent 03/12/2018   Advance care planning 03/12/2018   Shoulder pain 02/03/2017   Edema 11/02/2016   Diabetes mellitus with microalbuminuria ([place]) 06/10/2016   HLD (hyperlipidemia) 06/10/2016   DM type 2 with diabetic peripheral neuropathy ([place])    HTN (hypertension)    Accident caused by farm tractor 01/15/2016   Colon polyps 06/24/2013    Immunization History  Administered Date(s) Administered   Fluad Quad(high Dose 65+) 01/05/2019, 01/09/2020   Influenza,inj,Quad PF,6+ Mos 02/20/2013, 01/03/2014, 02/05/2015, 01/15/2016, 03/26/2016, 02/02/2017   Influenza-Unspecified 04/13/2010, 01/11/2018   PFIZER(Purple Top)SARS-COV-2 Vaccination 05/23/2019, 06/17/2019   Pneumococcal Conjugate-13 01/06/2012, 01/03/2014   Pneumococcal Polysaccharide-23 12/19/2012   Td 05/14/2014   Zoster Recombinat (Shingrix) 10/27/2018, 01/31/2019   Zoster, Live 12/19/2014    Conditions to be addressed/monitored:  Hypertension and Diabetes  Care Plan : [place]  Updates made by Debbora Dus, [place] since 01/29/2021 12:00 AM     Problem: CHL AMB "PATIENT-SPECIFIC  PROBLEM"      Long-Range Goal: Disease Management   Start Date: 07/18/2020  This Visit's Progress: Not on track  Recent Progress: On track  Priority: High  Note:   Current Barriers: Unable to achieve control of diabetes - BG elevated this week due to steroid injection   Pharmacist Clinical Goal(s):  Patient will adhere to plan to optimize therapeutic regimen for diabetes as evidenced by report of adherence to recommended medication management changes through collaboration with PharmD and provider.   Interventions: 1:1 collaboration with Tonia Ghent, MD regarding development and update of comprehensive plan of care as evidenced by provider attestation and co-signature Inter-disciplinary care team collaboration (see longitudinal plan of care) Comprehensive medication review performed; medication list updated in electronic medical record   Hypertension (BP goal <140/90) -Controlled - per home readings, stable and within goal -Current treatment: Amlodipine 5 mg - 1 tablet daily Lisinopril 30 mg - 1 tablet daily HCTZ 12.5 mg - 1 tablet daily -Medications previously tried: none -Patient confirms daily adherence in pill packs -Uses an arm cuff for at home monitoring -Current home readings: checks daily in morning before coffee or medications See below in DM section -Current dietary habits: trying to limit salt, low sodium diet was reviewed by nurse 09/11/20. -Takes morning meds at breakfast, night meds at 8:45 PM -He avoids frequent use of NSAIDs. -Denies hypotensive/hypertensive symptoms -Continue to monitor BP at home daily, document, and provide log at future appointments -Recommended continue current medications   Diabetes (A1c goal <7%) -Uncontrolled - Fasting BG elevated with recent steroid injection. Pt increased Levemir to 50 units daily last night. -Current medications: Levemir - Inject  units daily at bedtime  Glipizide 5 mg - 2 in the morning and 1 at night Jardiance  10 mg - 1 tablet daily  -Affirms taking above medications as prescribed 12/02/20 -Medications previously tried: metformin 500 mg - severe GI upset  -Current home glucose readings (He got a steroid shot 01/06/21 Dr. Ellene Route) Fasting BG/Blood pressure in morning:  9/27 - 377, 148/83, 69 9/28 - 332, 143/83, 71 9/29 - 211, 137/83, 69  (didn't eat much this night) 9/30 - 126, 151/84, 67 (wedding - ate a lot of carbs) 10/1 - 246, 121/87, 72 10/2 -  268, 142/81, 66 10/3 - 229, 116/73, 72 10/4 - 215, 114/79, 86 -Current meal patterns: family wedding this weekend, ate a lot of carbs  -Continue current medications; Limit carbs. Complete PAP for Jardiance.  Patient Goals/Self-Care Activities Patient will: - take medications as prescribed - check glucose daily, document, and provide at future appointments - check blood pressure once weekly, document, and provide at future appointments  Follow Up Plan: Telephone follow up appointment with care management team member scheduled for:  14 days     Medication Assistance: Application for Jardiance 10 mg  medication assistance program. in process.  Anticipated assistance start date TBD - patient has forms but has not completed.  See plan of care for additional detail.   Star Rating Drugs:  Medication:                Last Fill:         Day Supply Lisinopril 30 mg 01/01/21 30 DS Atorvastatin 80 mg  01/01/21 30 DS Jardiance 10 mg  01/01/21  30 DS   Patient's preferred pharmacy is: [facility] Pt received the following in adherence packaging on 01/03/21  Packs:  Amlodipine 2.5 mg - 2 tabs at breakfast Atorvastatin 80 mg - 1 evening meal  Glipizide 5 mg - 2 breakfast, 1 evening meal Lisinopril 30 mg - 1 evening meal  Prevagen Regular Strength - 1 at breakfast  Vials: Jardiance 10 mg - 1 tablet daily  Care Plan and Follow Up Patient Decision:  Patient agrees to Care Plan and Follow-up.  Debbora Dus, PharmD Clinical Pharmacist [place]  [facility] at [facility] [phone]

## 2021-01-20 ENCOUNTER — Telehealth: Payer: Self-pay

## 2021-01-20 NOTE — Progress Notes (Addendum)
Patient called to request test strips from pharmacy. Coordinated with pharmacy. Onetouch ultra test strips will out for delivery today.  Debbora Dus, CPP notified  Avel Sensor, [place] Assistant [phone]  Total time spent for month CPA: 10 min.

## 2021-01-21 NOTE — Progress Notes (Signed)
  Subjective:  Patient ID: Christian [name], male    DOB: 13-Jan-1941,  MRN: [id number]  Chief Complaint  Patient presents with   Diabetic Ulcer    Left foot ulcer x 1 week. Bleeding with open skin per pt.    80 y.o. male presents for wound care. Hx confirmed with patient. Denies warmth, erythema, SOI Objective:  Physical Exam: Wound Location: submet 1 Wound Measurement: 0.3cm diameter Wound Base: Granular/Healthy Peri-wound: Calloused Exudate: Scant/small amount Serosanguinous exudate wound without warmth, erythema, signs of acute infection  No images are attached to the encounter.  Radiographs:  X-ray of the left foot: no soft tissue emphysema, no signs of osteomyelitis Assessment:   1. Diabetic ulcer of toe of left foot associated with type 2 diabetes mellitus, limited to breakdown of skin ([place])      Plan:  Patient was evaluated and treated and all questions answered.  Ulcer left foot -XR reviewed with patient -Wound cleansed and debrided -Dressing applied consisting of sterile gauze and kerlix -Offload ulcer with surgical shoe  Return in about 3 weeks (around 02/04/2021).

## 2021-01-23 ENCOUNTER — Other Ambulatory Visit: Payer: Self-pay | Admitting: Family Medicine

## 2021-01-23 ENCOUNTER — Telehealth: Payer: Self-pay

## 2021-01-23 NOTE — Chronic Care Management (AMB) (Addendum)
Chronic Care Management Pharmacy Assistant   Name: Christian [name]  MRN: [id number] DOB: May 17, 1940  Reason for Encounter: Medication Adherence and Delivery Coordination    Recent office visits:  01/14/21- Family Medicine/CPP/CCM- Televisit-Patient presented for BG log and adherence. Recommend increase in Levemir to 47 units at bedtime.  Recent consult visits:  01/14/21-Podiatry-Patient presented for  left foot ulcer.Xrays,would cleansed and debrided, sterile gauze and surgical shoe to offload ulcer.Follow up 3 weeks.  Hospital visits:  None in previous 6 months  Medications: Outpatient Encounter Medications as of 01/23/2021  Medication Sig   amLODipine (NORVASC) 5 MG tablet Take 1 tablet (5 mg total) by mouth daily.   atorvastatin (LIPITOR) 80 MG tablet Take 1 tablet (80 mg total) by mouth at bedtime.   empagliflozin (JARDIANCE) 10 MG TABS tablet Take 1 tablet (10 mg total) by mouth daily before breakfast.   glipiZIDE (GLUCOTROL) 5 MG tablet Take 2 tablets by mouth in the morning and 1 tablet by mouth at night.   glucose blood (ONETOUCH ULTRA) test strip USE 1 STRIP TO CHECK GLUCOSE ONCE DAILY   hydrochlorothiazide (HYDRODIURIL) 12.5 MG tablet Take 1 tablet (12.5 mg total) by mouth daily.   insulin detemir (LEVEMIR FLEXTOUCH) 100 UNIT/ML FlexPen INJECT UP TO 50 UNITS SUBCUTANEOUSLY ONCE DAILY   Insulin Pen Needle (RELION PEN NEEDLE 31G/8MM) 31G X 8 MM MISC USE TO INJECT LEVEMIR DAILY   lisinopril (ZESTRIL) 30 MG tablet Take 1 tablet (30 mg total) by mouth daily.   No facility-administered encounter medications on file as of 01/23/2021.   BP Readings from Last 3 Encounters:  11/07/20 120/64  08/30/20 140/68  08/22/20 (!) 150/77    Lab Results  Component Value Date   HGBA1C 9.1 (H) 08/08/2020      Recent OV, Consult or Hospital visit: 01/14/21- Family Medicine/CPP/CCM- Televisit-Patient presented for BG log and adherence. Recommend increase in Levemir to 47 units at  bedtime.01/14/21-Podiatry-Patient presented for  left foot ulcer.Xrays,would cleansed and debrided, sterile gauze and surgical shoe to offload ulcer.Follow up 3 weeks.    Last adherence delivery date:01/07/21      Patient is due for next adherence delivery on: 02/05/21  Spoke with patient on 01/24/21 reviewed medications and coordinated delivery.  This delivery to include: Adherence Packaging  30 Days  Packs: Lisinopril 30mg  take 1 tablet evening meal Glipizide 5 mg - 2 tablets breakfast,1 tablet evening meal  Atorvastatin 80mg  take 1 tablet evening meal Amlodipine 5mg  take 1 tablet breakfast Prevagen chew 30 take 1 tablet breakfast Hydrochlorothiazide 12.5mg  take 1 tablet breakfast VIAL medications:  Jardiance 10 mg - 1 tablet morning (wants to keep in vial)  Trueplus Pen Needles  Patient declined the following medications this month: Levemir - Inject 50 units daily (patient prefers to get this from [facility])  Any concerns about your medications? No  How often do you forget or accidentally miss a dose? Rarely  Do you use a pillbox? No  Is patient in packaging Yes  What is the date on your next pill pack? Unknown  Any concerns or issues with your packaging? No still getting use to the new packs.   Refills requested from providers include: Lisinopril 30mg   Confirmed delivery date of 02/05/21, advised patient that pharmacy will contact them the morning of delivery.  Recent blood pressure readings are as follows: Week of 01/20/21  128/81  72-P,   126/77  67-P, 110/77  72-P 114/79  77-P  106/68  78-P   Recent blood glucose  readings are as follows: Fasting: week of 01/20/21  193,224,214,283,265   Annual wellness visit in last year? No  03/12/2018  Most Recent BP reading: 120/64  57-P 11/07/20  If Diabetic: Most recent A1C reading: 9.1 08/08/20 Last eye exam / retinopathy screening: 08/30/20 Last diabetic foot exam:  08/08/20  Debbora Dus, CPP notified  Avel Sensor,  [place] Assistant [phone]  I have reviewed the care management and care coordination activities outlined in this encounter and I am certifying that I agree with the content of this note. CCM visit within 7 days on calendar to continue to work on diabetes control.  Debbora Dus, PharmD Clinical Pharmacist [place] [facility] at [facility] [phone]

## 2021-01-28 ENCOUNTER — Telehealth: Payer: Self-pay

## 2021-01-28 ENCOUNTER — Telehealth: Payer: Medicare Other

## 2021-01-28 NOTE — Telephone Encounter (Signed)
Contacted patient today. His BG are still elevated, fasting 219-230 on average this week on Levemir 50 units daily, Jardiance 10 mg, and glipizide 5 mg. Would suspect this is no longer due to steroid injection from 01/06/21. Would like start Trulicity 1.79 mg and complete PAP to help with cost. Scheduled home visit for next Tuesday to complete paperwork. Sending to PCP for approval. If approved, please send rx to [facility] as "no print", thank you.  Debbora Dus, PharmD Clinical Pharmacist [place] [facility] at [facility] [phone]

## 2021-01-29 ENCOUNTER — Telehealth: Payer: Self-pay

## 2021-01-29 MED ORDER — TRULICITY 0.75 MG/0.5ML ~~LOC~~ SOAJ: 0.7500 mg | SUBCUTANEOUS | 1 refills | Status: DC

## 2021-01-29 NOTE — Telephone Encounter (Signed)
Rx added to med list.  Agreed.  Thanks.

## 2021-01-29 NOTE — Patient Instructions (Signed)
Dear Christian [name],  Below is a summary of the goals we discussed during our follow up appointment on January 14, 2021. Please contact me anytime with questions or concerns.   Visit Information  Patient Care Plan: CCM Pharmacy Care Plan     Problem Identified: CHL AMB "PATIENT-SPECIFIC PROBLEM"      Long-Range Goal: Disease Management   Start Date: 07/18/2020  This Visit's Progress: Not on track  Recent Progress: On track  Priority: High  Note:   Current Barriers: Unable to achieve control of diabetes - BG elevated this week due to steroid injection   Pharmacist Clinical Goal(s):  Patient will adhere to plan to optimize therapeutic regimen for diabetes as evidenced by report of adherence to recommended medication management changes through collaboration with PharmD and provider.   Interventions: 1:1 collaboration with Christian Ghent, MD regarding development and update of comprehensive plan of care as evidenced by provider attestation and co-signature Inter-disciplinary care team collaboration (see longitudinal plan of care) Comprehensive medication review performed; medication list updated in electronic medical record   Hypertension (BP goal <140/90) -Controlled - per home readings, stable and within goal -Current treatment: Amlodipine 5 mg - 1 tablet daily Lisinopril 30 mg - 1 tablet daily HCTZ 12.5 mg - 1 tablet daily -Medications previously tried: none -Patient confirms daily adherence in pill packs -Uses an arm cuff for at home monitoring -Current home readings: checks daily in morning before coffee or medications See below in DM section -Current dietary habits: trying to limit salt, low sodium diet was reviewed by nurse 09/11/20. -Takes morning meds at breakfast, night meds at 8:45 PM -He avoids frequent use of NSAIDs. -Denies hypotensive/hypertensive symptoms -Continue to monitor BP at home daily, document, and provide log at future appointments -Recommended continue  current medications   Diabetes (A1c goal <7%) -Uncontrolled - Fasting BG elevated with recent steroid injection. Pt increased Levemir to 50 units daily last night. -Current medications: Levemir - Inject  units daily at bedtime  Glipizide 5 mg - 2 in the morning and 1 at night Jardiance 10 mg - 1 tablet daily  -Affirms taking above medications as prescribed 12/02/20 -Medications previously tried: metformin 500 mg - severe GI upset  -Current home glucose readings (He got a steroid shot 01/06/21 Dr. Ellene [name]) Fasting BG/Blood pressure in morning:  9/27 - 377, 148/83, 69 9/28 - 332, 143/83, 71 9/29 - 211, 137/83, 69  (didn't eat much this night) 9/30 - 126, 151/84, 67 (wedding - ate a lot of carbs) 10/1 - 246, 121/87, 72 10/2 - 268, 142/81, 66 10/3 - 229, 116/73, 72 10/4 - 215, 114/79, 86 -Current meal patterns: family wedding this weekend, ate a lot of carbs  -Continue current medications; Limit carbs. Complete PAP for Jardiance.  Patient Goals/Self-Care Activities Patient will: - take medications as prescribed - check glucose daily, document, and provide at future appointments - check blood pressure once weekly, document, and provide at future appointments  Follow Up Plan: Telephone follow up appointment with care management team member scheduled for:  14 days      Patient verbalizes understanding of instructions provided today and agrees to view in [place].   Christian [name], PharmD Clinical Pharmacist [place] [facility] at [facility] [phone]

## 2021-01-29 NOTE — Progress Notes (Addendum)
    Chronic Care Management Pharmacy Assistant   Name: JOSHUE BADAL  MRN: [id number] DOB: Jul 29, 1940  01/29/2021 - Spoke with patient in regards to PAP forms for Jardiance [facility]); patient states he has looked at them, but has not filled them out.   Debbora Dus, CPP notified  [name], [place] Clinical Pharmacy Assistant [phone]

## 2021-02-04 ENCOUNTER — Ambulatory Visit: Payer: Medicare Other

## 2021-02-04 ENCOUNTER — Other Ambulatory Visit: Payer: Self-pay

## 2021-02-04 DIAGNOSIS — R809 Proteinuria, unspecified: Secondary | ICD-10-CM

## 2021-02-04 DIAGNOSIS — E1129 Type 2 diabetes mellitus with other diabetic kidney complication: Secondary | ICD-10-CM

## 2021-02-04 DIAGNOSIS — E1142 Type 2 diabetes mellitus with diabetic polyneuropathy: Secondary | ICD-10-CM

## 2021-02-04 DIAGNOSIS — I10 Essential (primary) hypertension: Secondary | ICD-10-CM

## 2021-02-07 ENCOUNTER — Ambulatory Visit: Payer: Medicare Other | Admitting: Podiatry

## 2021-02-07 ENCOUNTER — Other Ambulatory Visit: Payer: Self-pay

## 2021-02-07 DIAGNOSIS — E08621 Diabetes mellitus due to underlying condition with foot ulcer: Secondary | ICD-10-CM | POA: Diagnosis not present

## 2021-02-07 DIAGNOSIS — E11621 Type 2 diabetes mellitus with foot ulcer: Secondary | ICD-10-CM | POA: Diagnosis not present

## 2021-02-07 DIAGNOSIS — L97521 Non-pressure chronic ulcer of other part of left foot limited to breakdown of skin: Secondary | ICD-10-CM

## 2021-02-07 DIAGNOSIS — L97511 Non-pressure chronic ulcer of other part of right foot limited to breakdown of skin: Secondary | ICD-10-CM

## 2021-02-07 NOTE — Progress Notes (Signed)
  Subjective:  Patient ID: Christian [name], male    DOB: [birth year],  MRN: [id number]  Chief Complaint  Patient presents with   Foot Ulcer    Left foot ulcer, unable to wear boot because it is uncomfortable. Patient wearing athletic shoes today   80 y.o. male presents for wound care. Hx confirmed with patient. Denies issues with the left foot Objective:  Physical Exam: Wound Location: left submet 1 Wound Measurement: - Wound Base: epithelial tissue Peri-wound: Calloused Exudate: none wound without warmth, erythema, signs of acute infection   Wound Location: left submet 1 Wound Measurement: 0.4 cm diameter Wound Base: Granular/Healthy Peri-wound: Calloused Exudate: Scant/small amount Serosanguinous exudate wound without warmth, erythema, signs of acute infection  Assessment:   1. Diabetic ulcer of toe of left foot associated with type 2 diabetes mellitus, limited to breakdown of skin ([place])   2. Diabetic ulcer of other part of right foot associated with diabetes mellitus due to underlying condition, limited to breakdown of skin [facility])    Plan:  Patient was evaluated and treated and all questions answered.  Ulcer left foot -Appears healed  Ulcer right 1st MPJ -New today -Gently debrided -Dressed with SSD and foam border dressing -Advised to wear surgical shoe on this foot    No follow-ups on file.

## 2021-02-10 DIAGNOSIS — I10 Essential (primary) hypertension: Secondary | ICD-10-CM

## 2021-02-10 DIAGNOSIS — E1142 Type 2 diabetes mellitus with diabetic polyneuropathy: Secondary | ICD-10-CM

## 2021-02-10 DIAGNOSIS — E1129 Type 2 diabetes mellitus with other diabetic kidney complication: Secondary | ICD-10-CM

## 2021-02-10 DIAGNOSIS — R809 Proteinuria, unspecified: Secondary | ICD-10-CM | POA: Diagnosis not present

## 2021-02-10 MED ORDER — LEVEMIR FLEXTOUCH 100 UNIT/ML ~~LOC~~ SOPN: PEN_INJECTOR | SUBCUTANEOUS | 1 refills | Status: DC

## 2021-02-10 NOTE — Patient Instructions (Signed)
Dear Christian [name],  Below is a summary of the goals we discussed during our follow up appointment on February 04, 2021. Please contact me anytime with questions or concerns.   Visit Information  Patient Care Plan: CCM Pharmacy Care Plan     Problem Identified: CHL AMB "PATIENT-SPECIFIC PROBLEM"      Long-Range Goal: Disease Management   Start Date: 07/18/2020  This Visit's Progress: On track  Recent Progress: Not on track  Priority: High  Note:   Current Barriers: Unable to achieve control of diabetes - improving    Pharmacist Clinical Goal(s):  Patient will adhere to plan to optimize therapeutic regimen for diabetes as evidenced by report of adherence to recommended medication management changes through collaboration with PharmD and provider.   Interventions: 1:1 collaboration with Tonia Ghent, MD regarding development and update of comprehensive plan of care as evidenced by provider attestation and co-signature Inter-disciplinary care team collaboration (see longitudinal plan of care) Comprehensive medication review performed; medication list updated in electronic medical record   Hypertension (BP goal <140/90) -Controlled -reviewed his daily BP log and readings remain < 130/80 -Current treatment: Amlodipine 5 mg - 1 tablet daily Lisinopril 30 mg - 1 tablet daily HCTZ 12.5 mg - 1 tablet daily -Medications previously tried: none -Patient confirms daily adherence in pill packs -Uses an arm cuff for at home monitoring -Current home readings:  Month of October - checks daily in morning before coffee or medications Lowest - 98/66, 81 Highest - 142/81, 66 Average day - 115/75 -Current dietary habits: trying to limit salt, low sodium diet was reviewed by nurse 09/11/20. -Takes morning meds at breakfast, night meds at 8:45 PM -He avoids frequent use of NSAIDs. -Denies hypotensive/hypertensive symptoms -Continue to monitor BP at home daily, document, and provide log at future  appointments -Recommended continue current medications   Diabetes (A1c goal <7%) -Not ideally controlled - Improving as patient divided his insulin in 2 doses on either side, and taking a total of 68-70 units (self increased from 50 units over the past 2 weeks) -Current medications: Levemir - Inject 50 units daily at bedtime (pt taking differently: 70 units qhs) Glipizide 5 mg - 2 in the morning and 1 at night Jardiance 10 mg - 1 tablet daily  -Affirms taking above medications as prescribed 12/02/20 -Medications previously tried: metformin 500 mg - severe GI upset  -Current home glucose readings: Fasting: 10/5 until today - 269, 299, 210, 200, 213, 193, 224, 214, 283, 265, 281, 231, 230, 250, 217, 251, 180, 177, 109 (10/25 AM) -Current meal patterns: tries to limit carbs  -Collaborated with PCP - will start Trulicity 6.78 mg weekly pending PAP approval. May continue Levemir at 70 units qhs for now. Let us know if any hypoglycemia.   Patient Goals/Self-Care Activities Patient will: - take medications as prescribed - check glucose daily, document, and provide at future appointments - check blood pressure once weekly, document, and provide at future appointments  Follow Up Plan: Telephone follow up appointment with care management team member scheduled for:  14 days      Patient verbalizes understanding of instructions provided today and agrees to view in [place].   Debbora Dus, PharmD Clinical Pharmacist [facility] at [facility] [phone]

## 2021-02-10 NOTE — Progress Notes (Signed)
Chronic Care Management  Pharmacy Note  02/04/2021 Name:  Christian [name] MRN:  [id number] DOB:  [birth year]  Summary: Home visit for completion of PAP forms and diabetes follow up. He recently tried splitting his basal insulin into 2 injections and increased the dose from 50 units to a total of 70 units (on his own) - this brought his fasting BG down to 109 this morning. No hypoglycemia. I would still like to try Trulicity to limit his insulin dose and due to CV benefits. He agrees. We completed PAP forms for Trulicity 5.94 mg and Jardiance 10 mg.   Recommendation:  Recommend start Trulicity pending PAP approval.   Plan: CCM follow up 2 weeks Update Levemir to 70 units per day (pt taking this way)   Subjective: Christian [name] is an 80 y.o. year old male who is a primary patient of [name], [name], MD.  The CCM team was consulted for assistance with disease management and care coordination needs.    Engaged with patient by telephone for follow up visit in response to provider referral for pharmacy case management   Consent to Services:  The patient was given information about Chronic Care Management services, agreed to services, and gave verbal consent prior to initiation of services.  Please see initial visit note for detailed documentation.   Patient Care Team: Tonia Ghent, MD as PCP - General (Family Medicine) Debbora Dus, [facility] as Pharmacist (Pharmacist)  Recent office visits: 01/14/21 - CCM diabetes follow up - Continue current medication, improve diabetes diet/exercise.  Recent consult visits: None since last CCM contact  Hospital visits: None since last CCM contact  Objective:  Lab Results  Component Value Date   CREATININE 1.10 08/30/2020   BUN 17 08/30/2020   GFR 66.20 08/08/2020   GFRNONAA >60 08/22/2020   GFRAA >60 05/17/2016   NA 143 08/30/2020   K 4.8 08/30/2020   CALCIUM 9.1 08/30/2020   CO2 24 08/30/2020   GLUCOSE 264 (H) 08/30/2020    Lab  Results  Component Value Date/Time   HGBA1C 9.1 (H) 08/08/2020 10:03 AM   HGBA1C 9.5 (A) 04/16/2020 09:27 AM   HGBA1C 9.8 (H) 01/04/2020 08:02 AM   GFR 66.20 08/08/2020 10:03 AM   GFR 49.73 (L) 05/09/2020 11:23 AM   MICROALBUR 1.6 01/04/2020 08:02 AM   MICROALBUR 0.7 03/07/2018 09:41 AM    Last diabetic Eye exam:  Lab Results  Component Value Date/Time   HMDIABEYEEXA No Retinopathy 08/30/2020 12:00 AM    Last diabetic Foot exam: 08/08/20  PCP diabetic foot ulcer  Lab Results  Component Value Date   CHOL 85 01/04/2020   HDL 27.70 (L) 01/04/2020   LDLDIRECT 19.0 01/04/2020   TRIG 391.0 (H) 01/04/2020   CHOLHDL 3 01/04/2020    Hepatic Function Latest Ref Rng & Units 08/18/2020 01/04/2020 03/01/2018  Total Protein 6.5 - 8.1 g/dL 6.7 7.3 7.3  Albumin 3.5 - 5.0 g/dL 3.3(L) 4.2 4.3  AST 15 - 41 U/L '23 26 29  ' ALT 0 - 44 U/L '18 19 23  ' Alk Phosphatase 38 - 126 U/L 55 64 74  Total Bilirubin 0.3 - 1.2 mg/dL 0.9 0.9 0.8    Lab Results  Component Value Date/Time   TSH 3.337 03/06/2016 03:45 AM    CBC Latest Ref Rng & Units 08/30/2020 08/22/2020 08/19/2020  WBC 3.8 - 10.8 Thousand/uL 9.2 8.7 7.2  Hemoglobin 13.2 - 17.1 g/dL 12.4(L) 11.8(L) 10.7(L)  Hematocrit 38.5 - 50.0 % 38.0(L) 36.6(L) 33.6(L)  Platelets 140 - 400 Thousand/uL 222 232 185   Clinical ASCVD: No  The ASCVD Risk score (Arnett DK, et al., 2019) failed to calculate for the following reasons:   The valid total cholesterol range is 130 to 320 mg/dL    Depression screen [facility] 2/9 04/16/2020 03/08/2018 06/03/2016  Decreased Interest 0 0 0  Down, Depressed, Hopeless 0 0 0  PHQ - 2 Score 0 0 0    Social History   Tobacco Use  Smoking Status Never  Smokeless Tobacco Never   BP Readings from Last 3 Encounters:  11/07/20 120/64  08/30/20 140/68  08/22/20 (!) 150/77   Pulse Readings from Last 3 Encounters:  11/07/20 (!) 57  08/30/20 (!) 104  08/22/20 62   Wt Readings from Last 3 Encounters:  11/07/20 219 lb 8 oz (99.6  kg)  08/30/20 217 lb (98.4 kg)  08/08/20 217 lb (98.4 kg)   BMI Readings from Last 3 Encounters:  11/07/20 28.18 kg/m  08/30/20 27.86 kg/m  08/08/20 27.86 kg/m    Assessment/Interventions: Review of patient past medical history, allergies, medications, health status, including review of consultants reports, laboratory and other test data, was performed as part of comprehensive evaluation and provision of chronic care management services.   SDOH:  (Social Determinants of Health) assessments and interventions performed: Yes  SDOH Screenings   Alcohol Screen: Not on file  Depression (PHQ2-9): Low Risk    PHQ-2 Score: 0  Financial Resource Strain: Low Risk    Difficulty of Paying Living Expenses: Not very hard  Food Insecurity: Not on file  Housing: Not on file  Physical Activity: Not on file  Social Connections: Not on file  Stress: Not on file  Tobacco Use: Low Risk    Smoking Tobacco Use: Never   Smokeless Tobacco Use: Never   Passive Exposure: Not on file  Transportation Needs: Not on file    [address]  No Known Allergies  Medications Reviewed Today     Reviewed by Weyman Pedro, RN (Registered Nurse) on 02/07/21 at [place] List Status: <None>   Medication Order Taking? Sig Documenting Provider Last Dose Status Informant  amLODipine (NORVASC) 5 MG tablet [id number] No Take 1 tablet (5 mg total) by mouth daily. Tonia Ghent, MD Taking Active   atorvastatin (LIPITOR) 80 MG tablet [id number] No Take 1 tablet (80 mg total) by mouth at bedtime. Tonia Ghent, MD Taking Active Self  Dulaglutide (TRULICITY) 4.76 LY/6.5KP [facility] [id number]  Inject 0.75 mg into the skin once a week. Tonia Ghent, MD  Active   empagliflozin (JARDIANCE) 10 MG TABS tablet [id number] No Take 1 tablet (10 mg total) by mouth daily before breakfast. Tonia Ghent, MD Taking Active   glipiZIDE (GLUCOTROL) 5 MG tablet [id number] No Take 2 tablets by mouth in the morning and 1  tablet by mouth at night. Tonia Ghent, MD Taking Active Self  glucose blood [facility] ULTRA) test strip [id number] No USE 1 STRIP TO CHECK GLUCOSE ONCE DAILY Tonia Ghent, MD Taking Active   hydrochlorothiazide (HYDRODIURIL) 12.5 MG tablet [id number] No Take 1 tablet (12.5 mg total) by mouth daily. Tonia Ghent, MD Taking Active   insulin detemir [facility]) 100 UNIT/ML FlexPen [id number]  INJECT UP TO 50 UNITS SUBCUTANEOUSLY ONCE DAILY Tonia Ghent, MD  Active   Insulin Pen Needle (RELION PEN NEEDLE 31G/8MM) 31G X 8 MM MISC [id number] No USE TO INJECT LEVEMIR DAILY Tonia Ghent, MD Taking Active  lisinopril (ZESTRIL) 30 MG tablet [id number]  TAKE ONE TABLET BY MOUTH EVERY EVENING Tonia Ghent, MD  Active             Patient Active Problem List   Diagnosis Date Noted   Osteomyelitis of fifth toe of right foot ([place])    Diabetic foot infection ([place]) 08/18/2020   Diabetic foot ulcer ([place]) 08/11/2020   Healthcare maintenance 01/10/2020   Lower back pain 09/27/2019   Thumb pain 06/25/2019   Medicare annual wellness visit, subsequent 03/12/2018   Advance care planning 03/12/2018   Shoulder pain 02/03/2017   Edema 11/02/2016   Diabetes mellitus with microalbuminuria ([place]) 06/10/2016   HLD (hyperlipidemia) 06/10/2016   DM type 2 with diabetic peripheral neuropathy ([place])    HTN (hypertension)    Accident caused by farm tractor 01/15/2016   Colon polyps 06/24/2013    Immunization History  Administered Date(s) Administered   Fluad Quad(high Dose 65+) 01/05/2019, 01/09/2020   Influenza,inj,Quad PF,6+ Mos 02/20/2013, 01/03/2014, 02/05/2015, 01/15/2016, 03/26/2016, 02/02/2017   Influenza-Unspecified 04/13/2010, 01/11/2018   PFIZER(Purple Top)SARS-COV-2 Vaccination 05/23/2019, 06/17/2019   Pneumococcal Conjugate-13 01/06/2012, 01/03/2014   Pneumococcal Polysaccharide-23 12/19/2012   Td 05/14/2014   Zoster Recombinat (Shingrix) 10/27/2018, 01/31/2019   Zoster,  Live 12/19/2014    Conditions to be addressed/monitored:  Hypertension and Diabetes  Care Plan : [place]  Updates made by Debbora Dus, [place] since 02/10/2021 12:00 AM     Problem: CHL AMB "PATIENT-SPECIFIC PROBLEM"      Long-Range Goal: Disease Management   Start Date: 07/18/2020  This Visit's Progress: On track  Recent Progress: Not on track  Priority: High  Note:   Current Barriers: Unable to achieve control of diabetes - improving    Pharmacist Clinical Goal(s):  Patient will adhere to plan to optimize therapeutic regimen for diabetes as evidenced by report of adherence to recommended medication management changes through collaboration with PharmD and provider.   Interventions: 1:1 collaboration with Tonia Ghent, MD regarding development and update of comprehensive plan of care as evidenced by provider attestation and co-signature Inter-disciplinary care team collaboration (see longitudinal plan of care) Comprehensive medication review performed; medication list updated in electronic medical record   Hypertension (BP goal <140/90) -Controlled -reviewed his daily BP log and readings remain < 130/80 -Current treatment: Amlodipine 5 mg - 1 tablet daily Lisinopril 30 mg - 1 tablet daily HCTZ 12.5 mg - 1 tablet daily -Medications previously tried: none -Patient confirms daily adherence in pill packs -Uses an arm cuff for at home monitoring -Current home readings:  Month of October - checks daily in morning before coffee or medications Lowest - 98/66, 81 Highest - 142/81, 66 Average day - 115/75 -Current dietary habits: trying to limit salt, low sodium diet was reviewed by nurse 09/11/20. -Takes morning meds at breakfast, night meds at 8:45 PM -He avoids frequent use of NSAIDs. -Denies hypotensive/hypertensive symptoms -Continue to monitor BP at home daily, document, and provide log at future appointments -Recommended continue current medications    Diabetes (A1c goal <7%) -Not ideally controlled - Improving as patient divided his insulin in 2 doses on either side, and taking a total of 68-70 units (self increased from 50 units over the past 2 weeks) -Current medications: Levemir - Inject 50 units daily at bedtime (pt taking differently: 70 units qhs) Glipizide 5 mg - 2 in the morning and 1 at night Jardiance 10 mg - 1 tablet daily  -Affirms taking above medications as prescribed 12/02/20 -  Medications previously tried: metformin 500 mg - severe GI upset  -Current home glucose readings: Fasting: 10/5 until today - 269, 299, 210, 200, 213, 193, 224, 214, 283, 265, 281, 231, 230, 250, 217, 251, 180, 177, 109 (10/25 AM) -Current meal patterns: tries to limit carbs  -Collaborated with PCP - will start Trulicity 0.94 mg weekly pending PAP approval. May continue Levemir at 70 units qhs for now. Let us know if any hypoglycemia.   Patient Goals/Self-Care Activities Patient will: - take medications as prescribed - check glucose daily, document, and provide at future appointments - check blood pressure once weekly, document, and provide at future appointments  Follow Up Plan: Telephone follow up appointment with care management team member scheduled for:  14 days     Medication Assistance: Application for Jardiance 10 mg and Trulicity 7.09 mg  medication assistance program. in process.  Anticipated assistance start date 02/25/21.  See plan of care for additional detail.   Star Rating Drugs:  Medication:                Last Fill:         Day Supply Lisinopril 30 mg 01/31/21 30 DS Atorvastatin 80 mg  01/31/21 30 DS Jardiance 10 mg  01/31/21  30 DS   Patient's preferred pharmacy is: [facility] Pt received the following in adherence packaging on 02/04/21  Packs:  Amlodipine 2.5 mg - 2 tabs at breakfast Atorvastatin 80 mg - 1 evening meal  Glipizide 5 mg - 2 breakfast, 1 evening meal Lisinopril 30 mg - 1 evening meal  Prevagen  Regular Strength - 1 at breakfast  Vials: (keeps separate due to cost) Jardiance 10 mg - 1 tablet daily  Care Plan and Follow Up Patient Decision:  Patient agrees to Care Plan and Follow-up.  Debbora Dus, PharmD Clinical Pharmacist [place] [facility] at [facility] [phone]

## 2021-02-12 ENCOUNTER — Telehealth: Payer: Self-pay

## 2021-02-12 NOTE — Telephone Encounter (Addendum)
Patient assistance forms have been faxed to [facility] cares (Jardiance 10 mg ) and [employer] (Trulicity 6.29 mg).  Provider portion will be dropped off at [place] today around 12:30 PM for PCP signature. Please fax to program once signed.

## 2021-02-12 NOTE — Telephone Encounter (Signed)
Noted, awaiting the forms.

## 2021-02-17 NOTE — Telephone Encounter (Signed)
Forms faxed this morning.

## 2021-02-21 NOTE — Progress Notes (Signed)
02/21/2021   Called [name] in regards to [place] patient assistance form. They have received the forms, but it is in the que. They go in the order received.   [employer] in regard to [employer]. They received the forms. Patient did not complete pages 2 and 4. Patient needs to complete the annual income and number in household on page 2. Patient also need to sign and date page 4.  Debbora Dus, CPP notified  [name], [place] Clinical Pharmacy Assistant [phone]  Time Spent:  15 Minutes

## 2021-02-21 NOTE — Telephone Encounter (Signed)
Reviewed fax for [facility], page 2-4 were completed and accurate with all patient information. Re-faxed today 02/21/21 as the fax image was blurry on original. Amy, please call back 02/24/21 to see if they received pages 2-4.  Debbora Dus, PharmD Clinical Pharmacist [place] [facility] at [facility] [phone]

## 2021-02-24 ENCOUNTER — Telehealth: Payer: Self-pay

## 2021-02-24 NOTE — Progress Notes (Addendum)
Chronic Care Management Pharmacy Assistant   Name: Christian [name]  MRN: [id number] DOB: 03-Apr-1941  Reason for Encounter: CCM (Medication Adherence and Delivery Coordination)   Recent office visits:  None since last CCM contact  Recent consult visits:  02/07/2021 - Podiatry - Patient presented for left foot ulcer. Gently debrided, dressed with SSD and foam border dressing and advised to wear surgical shoe on this foot.  Hospital visits:  None since last CCM contact  Medications: Outpatient Encounter Medications as of 02/24/2021  Medication Sig   amLODipine (NORVASC) 5 MG tablet Take 1 tablet (5 mg total) by mouth daily.   atorvastatin (LIPITOR) 80 MG tablet Take 1 tablet (80 mg total) by mouth at bedtime.   Dulaglutide (TRULICITY) 3.15 XY/5.8PF SOPN Inject 0.75 mg into the skin once a week. (Patient not taking: Reported on 02/10/2021)   empagliflozin (JARDIANCE) 10 MG TABS tablet Take 1 tablet (10 mg total) by mouth daily before breakfast.   glipiZIDE (GLUCOTROL) 5 MG tablet Take 2 tablets by mouth in the morning and 1 tablet by mouth at night.   glucose blood (ONETOUCH ULTRA) test strip USE 1 STRIP TO CHECK GLUCOSE ONCE DAILY   hydrochlorothiazide (HYDRODIURIL) 12.5 MG tablet Take 1 tablet (12.5 mg total) by mouth daily.   insulin detemir (LEVEMIR FLEXTOUCH) 100 UNIT/ML FlexPen INJECT UP TO 70 UNITS SUBCUTANEOUSLY ONCE DAILY   Insulin Pen Needle (RELION PEN NEEDLE 31G/8MM) 31G X 8 MM MISC USE TO INJECT LEVEMIR DAILY   lisinopril (ZESTRIL) 30 MG tablet TAKE ONE TABLET BY MOUTH EVERY EVENING   No facility-administered encounter medications on file as of 02/24/2021.    BP Readings from Last 3 Encounters:  11/07/20 120/64  08/30/20 140/68  08/22/20 (!) 150/77    Lab Results  Component Value Date   HGBA1C 9.1 (H) 08/08/2020    Recent OV, Consult or Hospital visit:  No medication changes indicated  Last adherence delivery date: 02/05/2021  Patient is due for next  adherence delivery on: 03/07/2021  Spoke with patient on 02/24/2021 reviewed medications and coordinated delivery.  This delivery to include: Adherence Packaging  30 Days  Packs: Glipizide 5 mg - 2 tablets breakfast,1 tablet evening meal  Lisinopril 30mg  take 1 tablet evening meal Prevagen chew 30 take 1 tablet breakfast Hydrochlorothiazide 12.5mg  take 1 tablet breakfast Atorvastatin 80mg  take 1 tablet evening meal Amlodipine 5mg  take 1 tablet breakfast  VIAL medications: Jardiance 10 mg - 1 tablet morning (wants to keep in vial)   Patient declined the following medications this month: Levemir - Inject 50 units daily (patient prefers to get this from [facility]) Trueplus Pen Needles  Any concerns about your medications? No  How often do you forget or accidentally miss a dose? Never  Do you use a pillbox? No  Is patient in packaging Yes  If yes  What is the date on your next pill pack? Patient was unable to access next pack.  Any concerns or issues with your packaging? No  No refill request needed.  Confirmed delivery date of 03/07/2021, advised patient that pharmacy will contact them the morning of delivery.   Recent blood pressure readings are as follows: Patient states it has been good; patient was helping move furniture so was unable to give me readings.   Recent blood glucose readings are as follows: Patient states it has been as low as 97. It has been averaging around 120 lately. Patient was helping move furniture so was unable to give me readings.  Annual wellness visit in last year? No 03/12/2018 Most Recent BP reading: 120/64 on 11/07/2020  If Diabetic: Most recent A1C reading: 9.1 on 08/08/2020 Last eye exam / retinopathy screening: Up to date Last diabetic foot exam: 01/09/2020  Debbora Dus, CPP notified  [name], [place] Assistant [phone]  I have reviewed the care management and care coordination activities outlined in this  encounter and I am certifying that I agree with the content of this note. No further action required.  Debbora Dus, PharmD Clinical Pharmacist [place] [facility] at [facility] [phone]

## 2021-02-24 NOTE — Progress Notes (Addendum)
02/24/2021 - Called [employer] to confirm they received the fax we sent on 02/21/2021. They did not receive it. Please fax to [phone] and add Patient ID: [id number] to the cover sheet.   Debbora Dus, CPP notified  [name], [place] Clinical Pharmacy Assistant [phone]  Time Spent: 21 Minutes

## 2021-02-26 NOTE — Progress Notes (Signed)
Acute [facility] to add to delivery Onetouch test strips .  Debbora Dus, CPP notified  Avel Sensor, [place] Assistant [phone]  Total time spent for month CPA: 10 min.

## 2021-02-28 ENCOUNTER — Ambulatory Visit: Payer: Medicare Other | Admitting: Podiatry

## 2021-02-28 ENCOUNTER — Other Ambulatory Visit: Payer: Self-pay

## 2021-02-28 DIAGNOSIS — E08621 Diabetes mellitus due to underlying condition with foot ulcer: Secondary | ICD-10-CM

## 2021-02-28 DIAGNOSIS — L97511 Non-pressure chronic ulcer of other part of right foot limited to breakdown of skin: Secondary | ICD-10-CM

## 2021-02-28 NOTE — Progress Notes (Addendum)
Called [name] in regards to patient's Jardiance PAP. They did not receive a fax that included pages 1 and 3. Please refax all pages to [phone]. On the cover letter please include patient's name and birth date.  Called [employer] in regards to patient's Trulicity PAP. Patient was approved 02/26/21 - 04/12/2022.  Called patient to update; spoke with him.   Debbora Dus, CPP notified  [name], [place] Clinical Pharmacy Assistant [phone]  Time Spent:  25 Minutes

## 2021-03-03 NOTE — Progress Notes (Signed)
  Subjective:  Patient ID: Christian [name], male    DOB: 1941-03-18,  MRN: [id number]  Chief Complaint  Patient presents with   Diabetic Ulcer    Follow up recheck foot ulcer. Pt believes to be healing.    80 y.o. male presents for wound care. Hx confirmed with patient. Denies issues with the left foot Objective:  Physical Exam: Wound Location: left submet 1 Wound Measurement: - Wound Base: epithelial tissue Peri-wound: Calloused Exudate: none wound without warmth, erythema, signs of acute infection   Wound Location: Right submet 1 Wound Measurement: 0.4 cm diameter Wound Base: Granular/Healthy Peri-wound: Calloused Exudate: Scant/small amount Serosanguinous exudate wound without warmth, erythema, signs of acute infection  Assessment:   1. Diabetic ulcer of other part of right foot associated with diabetes mellitus due to underlying condition, limited to breakdown of skin [facility])    Plan:  Patient was evaluated and treated and all questions answered.  Ulcer left foot -Remains healed  Ulcer right 1st MPJ -Same as previous -The patient's DM inserts were offloaded to prevent pressure at this area -Wound debrided as below, dressed with silvadene and DSD  Procedure: Selective Debridement of Wound Rationale: Removal of devitalized tissue from the wound to promote healing.  Pre-Debridement Wound Measurements: 0.4 cm x 0.4 cm x 0.1 cm  Post-Debridement Wound Measurements: same as pre-debridement. Type of Debridement: sharp selective Instrumentation: dermal curette, tissue nipper Tissue Removed: Devitalized soft-tissue Dressing: Dry, sterile, compression dressing. Disposition: Patient tolerated procedure well.   Return in about 2 weeks (around 03/14/2021) for Wound Care.

## 2021-03-11 NOTE — Progress Notes (Signed)
I have added it to my schedule and will follow up on 03/14/2021.  Debbora Dus, CPP notified  [name], [place] Clinical Pharmacy Assistant [phone]

## 2021-03-11 NOTE — Progress Notes (Signed)
Spoke with patient; Trulicity has not arrived; advised patient when it arrives he is to call Debbora Dus (patient wrote down number) for instructions on how to administer. I also advised patient that his Jardiance patient assistance paperwork was re-faxed today. Patient voiced his understanding.  Debbora Dus, CPP notified  [name], [place] Clinical Pharmacy Assistant [phone]  Time Spent: 10 Minutes

## 2021-03-11 NOTE — Telephone Encounter (Signed)
Re-faxed Jardiance 10 mg forms pages 1-3 to [name] today. Amy, will you follow up in a few days?

## 2021-03-11 NOTE — Telephone Encounter (Addendum)
Amy, can you call patient and let him know to call us when he receives Trulicity in mail from manufacturer... Do not take it until he talks to [place] first. I will walk him through how to take it. Also let him know we ra-faxed the Jardiance forms today.  Debbora Dus, PharmD Clinical Pharmacist [facility] at [facility] [phone]

## 2021-03-14 ENCOUNTER — Ambulatory Visit: Payer: Medicare Other | Admitting: Podiatry

## 2021-03-14 ENCOUNTER — Other Ambulatory Visit: Payer: Self-pay

## 2021-03-14 DIAGNOSIS — L97521 Non-pressure chronic ulcer of other part of left foot limited to breakdown of skin: Secondary | ICD-10-CM | POA: Diagnosis not present

## 2021-03-14 DIAGNOSIS — E08621 Diabetes mellitus due to underlying condition with foot ulcer: Secondary | ICD-10-CM | POA: Diagnosis not present

## 2021-03-14 DIAGNOSIS — E11621 Type 2 diabetes mellitus with foot ulcer: Secondary | ICD-10-CM

## 2021-03-14 DIAGNOSIS — L97511 Non-pressure chronic ulcer of other part of right foot limited to breakdown of skin: Secondary | ICD-10-CM | POA: Diagnosis not present

## 2021-03-18 NOTE — Progress Notes (Signed)
  Subjective:  Patient ID: Christian [name], male    DOB: Dec 07, 1940,  MRN: [id number]  Chief Complaint  Patient presents with   wound    Wound Care.   80 y.o. male presents for wound care. Hx confirmed with patient.  Thinks that the right foot ulcer is doing about the same.  Reports some discharge but no pain denies swelling and redness Objective:  Physical Exam: Wound Location: left submet 1 Wound Measurement: - Wound Base: epithelial tissue Peri-wound: Calloused Exudate: none wound without warmth, erythema, signs of acute infection   Wound Location: Right submet 1 Wound Measurement: 0.3 cm diameter Wound Base: Granular/Healthy Peri-wound: Calloused Exudate: Scant/small amount Serosanguinous exudate wound without warmth, erythema, signs of acute infection  Assessment:   1. Diabetic ulcer of other part of right foot associated with diabetes mellitus due to underlying condition, limited to breakdown of skin ([place])   2. Diabetic ulcer of toe of left foot associated with type 2 diabetes mellitus, limited to breakdown of skin ([place])     Plan:  Patient was evaluated and treated and all questions answered.  Ulcer left 1st MPJ -Remains healed  Ulcer right 1st MPJ -Appears to be improving slowly -Continue offloading ulcerations -Wound minimally debrided and dressed with Silvadene and bandage  Return in about 1 month (around 04/14/2021) for Wound Care.

## 2021-03-20 NOTE — Telephone Encounter (Signed)
Please check on status of PAPs, thank you!

## 2021-03-25 NOTE — Progress Notes (Signed)
Called [name] for patient's Jardiance. Patient has been approved 03/25/2021 - 04/12/2022. I called patient to inform him. Patient would like for Debbora Dus to call him as he needs instructions on how to take his Trulicity and [place]. Patient has questions for Debbora Dus.   Debbora Dus, CPP notified  [name], [place] Clinical Pharmacy Assistant [phone]  Time Spent:  10 Minutes

## 2021-03-26 ENCOUNTER — Ambulatory Visit (INDEPENDENT_AMBULATORY_CARE_PROVIDER_SITE_OTHER): Payer: Medicare Other

## 2021-03-26 ENCOUNTER — Telehealth: Payer: Self-pay

## 2021-03-26 NOTE — Progress Notes (Signed)
Chronic Care Management  Pharmacy Note  02/04/2021 Name:  Christian [name] MRN:  [id number] DOB:  19-Apr-1940  Summary:  Patient approved for Trulicity and Jardiance assistance. He will start the Trulicity this week. Hold glipizide if BG < 90. Call with any concerns.  Recommendation:  No changes   Plan:  CCM follow up 6 weeks Recommend PCP visit for diabetes (past due)   Subjective: REYMOND [name] is an 80 y.o. year old male who is a primary patient of [name], [name], MD.  The CCM team was consulted for assistance with disease management and care coordination needs.    Engaged with patient by telephone for follow up visit in response to provider referral for pharmacy case management   Consent to Services:  The patient was given information about Chronic Care Management services, agreed to services, and gave verbal consent prior to initiation of services.  Please see initial visit note for detailed documentation.   Patient Care Team: Tonia Ghent, MD as PCP - General (Family Medicine) Debbora Dus, [facility] as Pharmacist (Pharmacist)  Recent office visits: 02/04/21 - CCM diabetes follow up - Recommend start Trulicity 5.63 mg weekly (pending PAP approval), improve diabetes diet/exercise.  Recent consult visits: None since last CCM contact  Hospital visits: None since last CCM contact  Objective:  Lab Results  Component Value Date   CREATININE 1.10 08/30/2020   BUN 17 08/30/2020   GFR 66.20 08/08/2020   GFRNONAA >60 08/22/2020   GFRAA >60 05/17/2016   NA 143 08/30/2020   K 4.8 08/30/2020   CALCIUM 9.1 08/30/2020   CO2 24 08/30/2020   GLUCOSE 264 (H) 08/30/2020    Lab Results  Component Value Date/Time   HGBA1C 9.1 (H) 08/08/2020 10:03 AM   HGBA1C 9.5 (A) 04/16/2020 09:27 AM   HGBA1C 9.8 (H) 01/04/2020 08:02 AM   GFR 66.20 08/08/2020 10:03 AM   GFR 49.73 (L) 05/09/2020 11:23 AM   MICROALBUR 1.6 01/04/2020 08:02 AM   MICROALBUR 0.7 03/07/2018 09:41 AM     Last diabetic Eye exam:  Lab Results  Component Value Date/Time   HMDIABEYEEXA No Retinopathy 08/30/2020 12:00 AM    Last diabetic Foot exam: 08/08/20  PCP diabetic foot ulcer  Lab Results  Component Value Date   CHOL 85 01/04/2020   HDL 27.70 (L) 01/04/2020   LDLDIRECT 19.0 01/04/2020   TRIG 391.0 (H) 01/04/2020   CHOLHDL 3 01/04/2020    Hepatic Function Latest Ref Rng & Units 08/18/2020 01/04/2020 03/01/2018  Total Protein 6.5 - 8.1 g/dL 6.7 7.3 7.3  Albumin 3.5 - 5.0 g/dL 3.3(L) 4.2 4.3  AST 15 - 41 U/L _0 ALT 0 - 44 U/L _1 Alk Phosphatase 38 - 126 U/L 55 64 74  Total Bilirubin 0.3 - 1.2 mg/dL 0.9 0.9 0.8    Lab Results  Component Value Date/Time   TSH 3.337 03/06/2016 03:45 AM    CBC Latest Ref Rng & Units 08/30/2020 08/22/2020 08/19/2020  WBC 3.8 - 10.8 Thousand/uL 9.2 8.7 7.2  Hemoglobin 13.2 - 17.1 g/dL 12.4(L) 11.8(L) 10.7(L)  Hematocrit 38.5 - 50.0 % 38.0(L) 36.6(L) 33.6(L)  Platelets 140 - 400 Thousand/uL 222 232 185   Clinical ASCVD: No  The ASCVD Risk score (Arnett DK, et al., 2019) failed to calculate for the following reasons:   The valid total cholesterol range is 130 to 320 mg/dL    Depression screen [facility] 2/9 04/16/2020 03/08/2018 06/03/2016  Decreased Interest 0  0 0  Down, Depressed, Hopeless 0 0 0  PHQ - 2 Score 0 0 0    Social History   Tobacco Use  Smoking Status Never  Smokeless Tobacco Never   BP Readings from Last 3 Encounters:  11/07/20 120/64  08/30/20 140/68  08/22/20 (!) 150/77   Pulse Readings from Last 3 Encounters:  11/07/20 (!) 57  08/30/20 (!) 104  08/22/20 62   Wt Readings from Last 3 Encounters:  11/07/20 219 lb 8 oz (99.6 kg)  08/30/20 217 lb (98.4 kg)  08/08/20 217 lb (98.4 kg)   BMI Readings from Last 3 Encounters:  11/07/20 28.18 kg/m  08/30/20 27.86 kg/m  08/08/20 27.86 kg/m    Assessment/Interventions: Review of patient past medical history, allergies, medications, health status, including review of  consultants reports, laboratory and other test data, was performed as part of comprehensive evaluation and provision of chronic care management services.   SDOH:  (Social Determinants of Health) assessments and interventions performed: Yes  SDOH Screenings   Alcohol Screen: Not on file  Depression (PHQ2-9): Low Risk    PHQ-2 Score: 0  Financial Resource Strain: Low Risk    Difficulty of Paying Living Expenses: Not very hard  Food Insecurity: Not on file  Housing: Not on file  Physical Activity: Not on file  Social Connections: Not on file  Stress: Not on file  Tobacco Use: Low Risk    Smoking Tobacco Use: Never   Smokeless Tobacco Use: Never   Passive Exposure: Not on file  Transportation Needs: Not on file    [place]  No Known Allergies  Medications Reviewed Today     Reviewed by Debbora Dus, [facility] (Pharmacist) on 03/26/21 at [place] List Status: <None>   Medication Order Taking? Sig Documenting Provider Last Dose Status Informant  amLODipine (NORVASC) 5 MG tablet [id number] Yes Take 1 tablet (5 mg total) by mouth daily. Tonia Ghent, MD Taking Active   atorvastatin (LIPITOR) 80 MG tablet [id number] Yes Take 1 tablet (80 mg total) by mouth at bedtime. Tonia Ghent, MD Taking Active Self  Dulaglutide (TRULICITY) 7.51 WC/5.8NI [place] [facility] [id number] Yes Inject 0.75 mg into the skin once a week.  Patient taking differently: Inject 0.75 mg into the skin once a week. Pt receives through [employer] - first dose 04/01/21, expires 04/12/2022   Tonia Ghent, MD Taking Active   empagliflozin (JARDIANCE) 10 MG TABS tablet [id number] Yes Take 1 tablet (10 mg total) by mouth daily before breakfast.  Patient taking differently: Take 10 mg by mouth daily before breakfast. Patient receives through [facility], expired 04/12/2022   Tonia Ghent, MD Taking Active   glipiZIDE (GLUCOTROL) 5 MG tablet [id number] Yes Take 2 tablets by mouth in the morning and 1 tablet by mouth at  night. Tonia Ghent, MD Taking Active Self  glucose blood [facility] ULTRA) test strip [id number] Yes USE 1 STRIP TO CHECK GLUCOSE ONCE DAILY Tonia Ghent, MD Taking Active   hydrochlorothiazide (HYDRODIURIL) 12.5 MG tablet [id number] Yes Take 1 tablet (12.5 mg total) by mouth daily. Tonia Ghent, MD Taking Active   insulin detemir [facility] FLEXTOUCH) 100 UNIT/ML FlexPen [id number] Yes INJECT UP TO 70 UNITS SUBCUTANEOUSLY ONCE DAILY Tonia Ghent, MD Taking Active   Insulin Pen Needle (RELION PEN NEEDLE 31G/8MM) 31G X 8 MM MISC [id number] Yes USE TO INJECT LEVEMIR DAILY Tonia Ghent, MD Taking Active   lisinopril (ZESTRIL) 30 MG tablet [id number] Yes TAKE  ONE TABLET BY MOUTH EVERY EVENING Tonia Ghent, MD Taking Active             Patient Active Problem List   Diagnosis Date Noted   Osteomyelitis of fifth toe of right foot ([place])    Diabetic foot infection ([place]) 08/18/2020   Diabetic foot ulcer ([place]) 08/11/2020   Healthcare maintenance 01/10/2020   Lower back pain 09/27/2019   Thumb pain 06/25/2019   Medicare annual wellness visit, subsequent 03/12/2018   Advance care planning 03/12/2018   Shoulder pain 02/03/2017   Edema 11/02/2016   Diabetes mellitus with microalbuminuria ([place]) 06/10/2016   HLD (hyperlipidemia) 06/10/2016   DM type 2 with diabetic peripheral neuropathy ([place])    HTN (hypertension)    Accident caused by farm tractor 01/15/2016   Colon polyps 06/24/2013    Immunization History  Administered Date(s) Administered   Fluad Quad(high Dose 65+) 01/05/2019, 01/09/2020   Influenza,inj,Quad PF,6+ Mos 02/20/2013, 01/03/2014, 02/05/2015, 01/15/2016, 03/26/2016, 02/02/2017   Influenza-Unspecified 04/13/2010, 01/11/2018   PFIZER(Purple Top)SARS-COV-2 Vaccination 05/23/2019, 06/17/2019   Pneumococcal Conjugate-13 01/06/2012, 01/03/2014   Pneumococcal Polysaccharide-23 12/19/2012   Td 05/14/2014   Zoster Recombinat (Shingrix) 10/27/2018, 01/31/2019    Zoster, Live 12/19/2014    Conditions to be addressed/monitored:  Hypertension and Diabetes  Care Plan : [place]  Updates made by Debbora Dus, [place] since 03/26/2021 12:00 AM     Problem: CHL AMB "PATIENT-SPECIFIC PROBLEM"      Long-Range Goal: Disease Management   Start Date: 07/18/2020  This Visit's Progress: Not on track  Recent Progress: On track  Priority: High  Note:   Current Barriers: Unable to achieve control of diabetes - improving    Pharmacist Clinical Goal(s):  Patient will adhere to plan to optimize therapeutic regimen for diabetes as evidenced by report of adherence to recommended medication management changes through collaboration with PharmD and provider.   Interventions: 1:1 collaboration with Tonia Ghent, MD regarding development and update of comprehensive plan of care as evidenced by provider attestation and co-signature Inter-disciplinary care team collaboration (see longitudinal plan of care) Comprehensive medication review performed; medication list updated in electronic medical record   Hypertension (BP goal <140/90) -Controlled -reviewed his daily BP log and readings remain < 130/80 -Current treatment: Amlodipine 5 mg - 1 tablet daily Lisinopril 30 mg - 1 tablet daily HCTZ 12.5 mg - 1 tablet daily -Medications previously tried: none -Patient confirms daily adherence in pill packs -Uses an arm cuff for at home monitoring -Current home readings: Month of October - checks daily in morning before coffee or medications Stable, running 120s/70s -Current dietary habits: trying to limit salt, low sodium diet was reviewed by nurse 09/11/20. -Takes morning meds at breakfast, night meds at 8:45 PM -He avoids frequent use of NSAIDs. -Denies hypotensive/hypertensive symptoms -Continue to monitor BP at home daily, document, and provide log at future appointments -Recommended continue current medications   Diabetes (A1c goal <7%) -Not  ideally controlled - Pt approved for Jardiance and Trulicity assistance program this month. He has not started the Trulicity yet, due to waiting on instructions. Provided instructions today and he will start this week. If BG start to drops < 90 on Trulicity, he will discontinue glipizide. He continues Levemir 70 units at bedtime.  -Current medications: Levemir - Inject 70 units daily at bedtime  Glipizide 5 mg - 2 in the morning and 1 at night Jardiance 10 mg - 1 tablet daily  -Medications previously tried: metformin 500 mg -  severe GI upset  -Current home glucose readings: Fasting: 225 (pecan pie last night), ranging 155-195 (reports BG has been elevated lately due to food choices) -Current meal patterns: tries to limit carbs  -Patient approved for Trulicity 7.42 mg, he will start this week. Counseled on eating small meals to lower GI side effects. He will stop glipizide if any hypoglycemia.  Patient Goals/Self-Care Activities Patient will: - take medications as prescribed - check glucose daily, document, and provide at future appointments - check blood pressure once weekly, document, and provide at future appointments  Follow Up Plan: Telephone follow up appointment with care management team member scheduled for:  6 weeks    Medication Assistance:  Jardiance 10 mg and Trulicity 5.95 mg obtained through [name] medication assistance program.  Enrollment ends 04/12/2022    Star Rating Drugs:  Medication:                Last Fill:         Day Supply Lisinopril 30 mg 03/03/21 30 DS Atorvastatin 80 mg  03/03/21 30 DS   Patient's preferred pharmacy is: [facility] Pt received the following in adherence packaging on 03/07/21  Packs:  Amlodipine 2.5 mg - 2 tabs at breakfast Atorvastatin 80 mg - 1 evening meal  Glipizide 5 mg - 2 breakfast, 1 evening meal Lisinopril 30 mg - 1 evening meal  Prevagen Regular Strength - 1 at breakfast  PAP: Jardiance 10 mg - 1 tablet  daily Trulicity 6.38 mg - Inject once weekly   Care Plan and Follow Up Patient Decision:  Patient agrees to Care Plan and Follow-up.  Debbora Dus, PharmD Clinical Pharmacist [facility] [facility] at [facility] [phone]

## 2021-03-26 NOTE — Progress Notes (Addendum)
Chronic Care Management Pharmacy Assistant   Name: Christian [name]  MRN: [id number] DOB: [birth year]  Reason for Encounter: Medication Adherence and Delivery Coordination   Recent office visits:  None since last CCM contact  Recent consult visits:  03/14/21-Podiatry -Patient presented for diabetic ulcer right foot.Wound minimally debrided and dressed with Silvadene and bandage 02/28/21-Podiatry-Patient presented for diabetic ulcer right foot.Wound debrided as below, dressed with silvadene and DSD 02/07/21-Podiatry-Patient presented for left foot ulcer follow up. Appears healed,new right toe ulcer,gently debrided.  Hospital visits:  None in previous 6 months  Medications: Outpatient Encounter Medications as of 03/26/2021  Medication Sig   amLODipine (NORVASC) 5 MG tablet Take 1 tablet (5 mg total) by mouth daily.   atorvastatin (LIPITOR) 80 MG tablet Take 1 tablet (80 mg total) by mouth at bedtime.   Dulaglutide (TRULICITY) 3.66 QH/4.7ML SOPN Inject 0.75 mg into the skin once a week. (Patient not taking: Reported on 02/10/2021)   empagliflozin (JARDIANCE) 10 MG TABS tablet Take 1 tablet (10 mg total) by mouth daily before breakfast.   glipiZIDE (GLUCOTROL) 5 MG tablet Take 2 tablets by mouth in the morning and 1 tablet by mouth at night.   glucose blood (ONETOUCH ULTRA) test strip USE 1 STRIP TO CHECK GLUCOSE ONCE DAILY   hydrochlorothiazide (HYDRODIURIL) 12.5 MG tablet Take 1 tablet (12.5 mg total) by mouth daily.   insulin detemir (LEVEMIR FLEXTOUCH) 100 UNIT/ML FlexPen INJECT UP TO 70 UNITS SUBCUTANEOUSLY ONCE DAILY   Insulin Pen Needle (RELION PEN NEEDLE 31G/8MM) 31G X 8 MM MISC USE TO INJECT LEVEMIR DAILY   lisinopril (ZESTRIL) 30 MG tablet TAKE ONE TABLET BY MOUTH EVERY EVENING   No facility-administered encounter medications on file as of 03/26/2021.   BP Readings from Last 3 Encounters:  11/07/20 120/64  08/30/20 140/68  08/22/20 (!) 150/77    Lab Results  Component  Value Date   HGBA1C 9.1 (H) 08/08/2020      Recent OV, Consult or Hospital visit:  No medication changes indicated   Last adherence delivery date:03/07/21      Patient is due for next adherence delivery on: 04/08/21  Spoke with patient on 03/26/21 reviewed medications and coordinated delivery.  This delivery to include: Adherence Packaging  30 Days  Packs: Glipizide 5 mg - 2 tablets breakfast,1 tablet evening meal  Lisinopril 30mg  take 1 tablet evening meal Prevagen chew 30 take 1 tablet breakfast Hydrochlorothiazide 12.5mg  take 1 tablet breakfast Atorvastatin 80mg  take 1 tablet evening meal Amlodipine 5mg  take 1 tablet breakfast  VIAL medications: None   Patient declined the following medications this month: Levemir - Inject 70 units daily (patient prefers to get this from [facility]) Jardiance 10 mg - 1 tablet morning (PAP approved through 46/50/35) Trulicity 4.65 mg  Inject once weekly  (PAP approved through 04/12/21) Trueplus Pen Needles (picks up at [facility]) One Touch test strips - due for refill 04/20/20  Any concerns about your medications? No  How often do you forget or accidentally miss a dose? Never  Do you use a pillbox? No  Is patient in packaging Yes  What is the date on your next pill pack? The patient reports he is not near the box to read the date   Any concerns or issues with your packaging? The patient likes the packs   No refill request needed.  Confirmed delivery date of 04/08/21, advised patient that pharmacy will contact them the morning of delivery.  Recent blood pressure readings are as follows: none  reported  Recent blood glucose readings are as follows: Fasting:   03/13/21-126       03/14/21-216       03/16/21-168       03/18/21-211       03/20/21-193       03/21/21-185      03/23/21-230                 03/24/21-179      03/25/21-155      03/26/21-225     (patient stated he ate pecan pie last night)  Annual wellness visit in last year?  No Most Recent BP reading: 120/64   57-P  11/07/20  If Diabetic: Most recent A1C reading: 9.1 08/08/20 Last eye exam / retinopathy screening: up to date  Last diabetic foot exam: pt sees podiatry regularly   Christian [name], CPP notified  Christian [name], [place] Assistant [phone]  I have reviewed the care management and care coordination activities outlined in this encounter and I am certifying that I agree with the content of this note. No further action required.  Christian [name], PharmD Clinical Pharmacist [facility] [facility] at [facility] [phone]

## 2021-03-26 NOTE — Patient Instructions (Signed)
Dear [name],  Below is a summary of the goals we discussed during our follow up appointment on March 26, 2021. Please contact me anytime with questions or concerns.   Visit Information Patient Care Plan: CCM Pharmacy Care Plan     Problem Identified: CHL AMB "PATIENT-SPECIFIC PROBLEM"      Long-Range Goal: Disease Management   Start Date: 07/18/2020  This Visit's Progress: Not on track  Recent Progress: On track  Priority: High  Note:   Current Barriers: Unable to achieve control of diabetes - improving    Pharmacist Clinical Goal(s):  Patient will adhere to plan to optimize therapeutic regimen for diabetes as evidenced by report of adherence to recommended medication management changes through collaboration with PharmD and provider.   Interventions: 1:1 collaboration with Tonia Ghent, MD regarding development and update of comprehensive plan of care as evidenced by provider attestation and co-signature Inter-disciplinary care team collaboration (see longitudinal plan of care) Comprehensive medication review performed; medication list updated in electronic medical record   Hypertension (BP goal <140/90) -Controlled -reviewed his daily BP log and readings remain < 130/80 -Current treatment: Amlodipine 5 mg - 1 tablet daily Lisinopril 30 mg - 1 tablet daily HCTZ 12.5 mg - 1 tablet daily -Medications previously tried: none -Patient confirms daily adherence in pill packs -Uses an arm cuff for at home monitoring -Current home readings: Month of October - checks daily in morning before coffee or medications Stable, running 120s/70s -Current dietary habits: trying to limit salt, low sodium diet was reviewed by nurse 09/11/20. -Takes morning meds at breakfast, night meds at 8:45 PM -He avoids frequent use of NSAIDs. -Denies hypotensive/hypertensive symptoms -Continue to monitor BP at home daily, document, and provide log at future appointments -Recommended continue  current medications   Diabetes (A1c goal <7%) -Not ideally controlled - Pt approved for Jardiance and Trulicity assistance program this month. He has not started the Trulicity yet, due to waiting on instructions. Provided instructions today and he will start this week. If BG start to drops < 90 on Trulicity, he will discontinue glipizide. He continues Levemir 70 units at bedtime.  -Current medications: Levemir - Inject 70 units daily at bedtime  Glipizide 5 mg - 2 in the morning and 1 at night Jardiance 10 mg - 1 tablet daily  -Medications previously tried: metformin 500 mg - severe GI upset  -Current home glucose readings: Fasting: 225 (pecan pie last night), ranging 155-195 (reports BG has been elevated lately due to food choices) -Current meal patterns: tries to limit carbs  -Patient approved for Trulicity 0.26 mg, he will start this week. Counseled on eating small meals to lower GI side effects. He will stop glipizide if any hypoglycemia.  Patient Goals/Self-Care Activities Patient will: - take medications as prescribed - check glucose daily, document, and provide at future appointments - check blood pressure once weekly, document, and provide at future appointments  Follow Up Plan: Telephone follow up appointment with care management team member scheduled for:  6 weeks      Patient verbalizes understanding of instructions provided today and agrees to view in [place].   Debbora Dus, PharmD Clinical Pharmacist Practitioner [place] [facility] at [facility] [phone]

## 2021-03-27 NOTE — Telephone Encounter (Signed)
Pt called 12/13 to inquire about test strip refill. It is too soon to fill. His prescription was filled 90 DS last month. He is checking BG once daily, but sharing with his wife. She needed a refill, so I sent note to Dr. [name].  Debbora Dus, PharmD Clinical Pharmacist Practitioner [place] [facility] at [facility] [phone]

## 2021-04-01 ENCOUNTER — Ambulatory Visit (INDEPENDENT_AMBULATORY_CARE_PROVIDER_SITE_OTHER): Payer: Medicare Other

## 2021-04-01 ENCOUNTER — Other Ambulatory Visit: Payer: Self-pay

## 2021-04-01 DIAGNOSIS — Z23 Encounter for immunization: Secondary | ICD-10-CM | POA: Diagnosis not present

## 2021-04-01 NOTE — Progress Notes (Signed)
Patient presented for Flu shot given by Sherrilee Gilles, CMA to left deltoid, patient voiced no concerns nor showed any signs of distress during injection.

## 2021-04-12 DIAGNOSIS — I10 Essential (primary) hypertension: Secondary | ICD-10-CM

## 2021-04-12 DIAGNOSIS — E1142 Type 2 diabetes mellitus with diabetic polyneuropathy: Secondary | ICD-10-CM

## 2021-04-15 ENCOUNTER — Ambulatory Visit: Payer: Medicare Other | Admitting: Podiatry

## 2021-04-15 ENCOUNTER — Other Ambulatory Visit: Payer: Self-pay

## 2021-04-15 ENCOUNTER — Ambulatory Visit: Payer: Medicare Other

## 2021-04-15 DIAGNOSIS — L97521 Non-pressure chronic ulcer of other part of left foot limited to breakdown of skin: Secondary | ICD-10-CM

## 2021-04-15 DIAGNOSIS — E11621 Type 2 diabetes mellitus with foot ulcer: Secondary | ICD-10-CM

## 2021-04-15 DIAGNOSIS — E08621 Diabetes mellitus due to underlying condition with foot ulcer: Secondary | ICD-10-CM | POA: Diagnosis not present

## 2021-04-15 DIAGNOSIS — L97511 Non-pressure chronic ulcer of other part of right foot limited to breakdown of skin: Secondary | ICD-10-CM

## 2021-04-15 DIAGNOSIS — E1142 Type 2 diabetes mellitus with diabetic polyneuropathy: Secondary | ICD-10-CM

## 2021-04-15 NOTE — Progress Notes (Signed)
°  Subjective:  Patient ID: Christian [name], male    DOB: August 05, 1940,  MRN: [id number]  Chief Complaint  Patient presents with   Wound Check   81 y.o. male presents for wound care. Hx confirmed with patient.  States the foot is doing well but would like the callused areas rechecked. Pain level 3 or 4/10. Not constant. Oral temp 99.2 Objective:  Physical Exam: Wound Location: left submet 5 Wound Measurement: - Wound Base: epithelial tissue Peri-wound: Calloused Exudate: none wound without warmth, erythema, signs of acute infection   Wound Location: Right submet 1 Wound Measurement: - Wound Base: healed Peri-wound: Calloused Exudate: - wound without warmth, erythema, signs of acute infection  Assessment:   1. Diabetic ulcer of other part of right foot associated with diabetes mellitus due to underlying condition, limited to breakdown of skin ([place])   2. Diabetic ulcer of toe of left foot associated with type 2 diabetes mellitus, limited to breakdown of skin ([place])     Plan:  Patient was evaluated and treated and all questions answered.  Ulcer left 1st MPJ -Remains healed, gently debrided no open ulceration  Ulcer right 1st MPJ -Remains healed,  gently debrided no open ulceration  Will have patient see Christian [name] - orthotist- for new shoes and inserts. Hopefully with these these lesions should not re-ulcerate but they are high pressure and high risk.  Return in about 2 months (around 06/13/2021).

## 2021-04-16 ENCOUNTER — Ambulatory Visit: Payer: Medicare Other

## 2021-04-21 ENCOUNTER — Other Ambulatory Visit: Payer: Self-pay

## 2021-04-21 ENCOUNTER — Ambulatory Visit (INDEPENDENT_AMBULATORY_CARE_PROVIDER_SITE_OTHER): Payer: Medicare Other | Admitting: Family Medicine

## 2021-04-21 ENCOUNTER — Encounter: Payer: Self-pay | Admitting: Family Medicine

## 2021-04-21 VITALS — BP 110/64 | HR 101 | Temp 97.1°F | Ht 74.0 in | Wt 210.0 lb

## 2021-04-21 DIAGNOSIS — K625 Hemorrhage of anus and rectum: Secondary | ICD-10-CM | POA: Diagnosis not present

## 2021-04-21 DIAGNOSIS — E1142 Type 2 diabetes mellitus with diabetic polyneuropathy: Secondary | ICD-10-CM | POA: Diagnosis not present

## 2021-04-21 LAB — POCT GLYCOSYLATED HEMOGLOBIN (HGB A1C): Hemoglobin A1C: 10.5 % — AB (ref 4.0–5.6)

## 2021-04-21 MED ORDER — NYSTATIN 100000 UNIT/GM EX CREA: 1.0000 "application " | TOPICAL_CREAM | Freq: Two times a day (BID) | CUTANEOUS | 1 refills | Status: DC

## 2021-04-21 MED ORDER — ONETOUCH ULTRA VI STRP: ORAL_STRIP | 3 refills | Status: DC

## 2021-04-21 NOTE — Progress Notes (Signed)
This visit occurred during the SARS-CoV-2 public health emergency.  Safety protocols were in place, including screening questions prior to the visit, additional usage of staff PPE, and extensive cleaning of exam room while observing appropriate contact time as indicated for disinfecting solutions.  Diabetes:  Using medications without difficulties:on insulin but not yet on trulicity.  Is on insulin, jardiance, glipizide.  D/w pt about starting trulicity.   Hypoglycemic episodes: no Hyperglycemic episodes: see below.   Feet problems: see below.  Some tingling.   Blood Sugars averaging:140-236 recently.   eye exam within last year: yes D/w pt about A1c, 10.5.  Penile irritation noted by patient.  See exam.   He had occ BRBPR with large BMs.  He has noted h/o external and internal hemorrhoids. No abd pain.  No fevers, no chills.  No black stools.    Meds, vitals, and allergies reviewed.  ROS: Per HPI unless specifically indicated in ROS section   GEN: nad, alert and oriented HEENT: ncat NECK: supple w/o LA CV: rrr. PULM: ctab, no inc wob ABD: soft, +bs EXT: no edema SKIN: no acute rash Penile exam with fungal infection on the glans.    Diabetic foot exam: Normal inspection No skin breakdown B 1st MT calluses  intact DP pulses dec sensation to light touch and monofilament Nails thickened, esp L 1st nail.    30 minutes were devoted to patient care in this encounter (this includes time spent reviewing the patient's file/history, interviewing and examining the patient, counseling/reviewing plan with patient).

## 2021-04-21 NOTE — Patient Instructions (Addendum)
Keep taking insulin daily and add on trulicity on Mondays.  Update me about your sugar in about 3 weeks.  Plan on recheck in about 3 months with labs prior to a yearly visit.   We'll call about seeing GI.   Use nystatin cream twice a day in the meantime, until better.  Take care.  Glad to see you.

## 2021-04-21 NOTE — Progress Notes (Signed)
SITUATION Reason for Consult: Evaluation for Prefabricated Diabetic Shoes and Bilateral Custom Diabetic Inserts. Patient / Caregiver Report: Patient would like diabetic shoes  OBJECTIVE DATA: Patient History / Diagnosis:    ICD-10-CM   1. DM type 2 with diabetic peripheral neuropathy ([place])  E11.42     2. Diabetic ulcer of other part of right foot associated with diabetes mellitus due to underlying condition, limited to breakdown of skin ([place])  U54.270    L97.511     3. Diabetic ulcer of toe of left foot associated with type 2 diabetes mellitus, limited to breakdown of skin ([facility])  E11.621    L97.521       Presence of Diabetic Complications: - Peripheral Neuropathy  Current or Previous Devices: None and no history  In-Person Foot Examination:  Skin presentation:   Thin, Shiny, Hairless Nail presentation:   Thick, Ingrown, With Fungus Ulcers & Callousing:   Ulceration of right foot  Shoe Size: 12.70M  ORTHOTIC RECOMMENDATION Recommended Devices: - 1x pair prefabricated PDAC approved diabetic shoes: X821M 12.70M - 3x pair custom-to-patient direct vacuum formed diabetic insoles.   GOALS OF SHOES AND INSOLES - Reduce shear and pressure - Reduce / Prevent callus formation - Reduce / Prevent ulceration - Protect the fragile healing compromised diabetic foot.  Patient would benefit from diabetic shoes and inserts as patient has diabetes mellitus and the patient has one or more of the following conditions: - History of partial or complete amputation of the foot - History of previous foot ulceration. - History of pre-ulcerative callus - Peripheral neuropathy with evidence of callus formation - Foot deformity - Poor circulation  ACTIONS PERFORMED Patient was casted for insoles via crush box and measured for shoes via brannock device. Procedure was explained and patient tolerated procedure well. All questions were answered and concerns addressed.  PLAN Insurance to be verified  and out of pocket cost communicated to patient. Once cost verified and agreed upon and diabetic certification received, casts are to be sent to [facility] for fabrication. Patient is to be called for fitting when devices are ready.

## 2021-04-23 DIAGNOSIS — K625 Hemorrhage of anus and rectum: Secondary | ICD-10-CM | POA: Insufficient documentation

## 2021-04-23 NOTE — Assessment & Plan Note (Signed)
on insulin but not yet on trulicity.  Is on insulin, jardiance, glipizide.  D/w pt about starting trulicity.   I asked him to keep taking insulin daily and add on trulicity on Mondays.  I asked him to update me about his sugar in about 3 weeks.   He likely has a superficial fungal infection on the glans.  I would still continue Jardiance.  If he stops that medication his sugar will still likely be high enough to cause glucosuria and still put him at risk of fungal infections.  I think it makes sense to try to get his sugar controlled and treat with nystatin in the meantime.  See after visit summary.  He agrees with plan.

## 2021-04-23 NOTE — Assessment & Plan Note (Signed)
Refer to GI 

## 2021-04-24 ENCOUNTER — Other Ambulatory Visit: Payer: Self-pay | Admitting: Family Medicine

## 2021-04-25 ENCOUNTER — Telehealth: Payer: Self-pay

## 2021-04-25 ENCOUNTER — Other Ambulatory Visit: Payer: Self-pay | Admitting: Family Medicine

## 2021-04-25 NOTE — Chronic Care Management (AMB) (Addendum)
Chronic Care Management Pharmacy Assistant   Name: Christian [name]  MRN: [id number] DOB: May 15, 1940  Reason for Encounter: Medication Adherence and Delivery Coordination  Recent office visits:  04/21/21 - Elsie Stain, PCP - Patient presented for follow up diabetes. New A1c 10.5%. Start Trulicity. Referral for GI. Follow up 3 months.  Recent consult visits:  04/15/21 - Podiatry - Patient presented for wound check. Send to orthotist for new shoes and inserts.  Hospital visits:  None in previous 6 months  Medications: Outpatient Encounter Medications as of 04/25/2021  Medication Sig   amLODipine (NORVASC) 5 MG tablet Take 1 tablet (5 mg total) by mouth daily.   atorvastatin (LIPITOR) 80 MG tablet TAKE ONE TABLET BY MOUTH EVERY EVENING   Dulaglutide (TRULICITY) 8.29 HB/7.1IR SOPN Inject 0.75 mg into the skin once a week.   empagliflozin (JARDIANCE) 10 MG TABS tablet Take 1 tablet (10 mg total) by mouth daily before breakfast.   glipiZIDE (GLUCOTROL) 5 MG tablet TAKE TWO TABLETS BY MOUTH EVERY MORNING and TAKE ONE TABLET BY MOUTH EVERY EVENING   glucose blood (ONETOUCH ULTRA) test strip USE 1 STRIP TO CHECK GLUCOSE ONCE DAILY   hydrochlorothiazide (HYDRODIURIL) 12.5 MG tablet Take 1 tablet (12.5 mg total) by mouth daily.   insulin detemir (LEVEMIR FLEXTOUCH) 100 UNIT/ML FlexPen INJECT UP TO 70 UNITS SUBCUTANEOUSLY ONCE DAILY   Insulin Pen Needle (RELION PEN NEEDLE 31G/8MM) 31G X 8 MM MISC USE TO INJECT LEVEMIR DAILY   lisinopril (ZESTRIL) 30 MG tablet TAKE ONE TABLET BY MOUTH EVERY EVENING   nystatin cream (MYCOSTATIN) Apply 1 application topically 2 (two) times daily.   No facility-administered encounter medications on file as of 04/25/2021.    BP Readings from Last 3 Encounters:  04/21/21 110/64  11/07/20 120/64  08/30/20 140/68    Lab Results  Component Value Date   HGBA1C 10.5 (A) 04/21/2021     Recent OV, Consult or Hospital visit:  04/21/21  PCP Recent medication changes  indicated:   Start Trulicity   Last adherence delivery date: 04/08/21      Patient is due for next adherence delivery on: 05/07/21  Spoke with patient on 04/25/21 reviewed medications and coordinated delivery.  This delivery to include: Adherence Packaging  30 Days  Packs: Glipizide 5 mg - 2 tablets breakfast,1 tablet evening meal  Lisinopril 30mg  take 1 tablet evening meal Prevagen chew 30 take 1 tablet breakfast Hydrochlorothiazide 12.5mg  take 1 tablet breakfast Atorvastatin 80mg  take 1 tablet evening meal Amlodipine 5mg  take 1 tablet breakfast  VIAL medications: Trueplus Pen Needles One Touch test strips  Patient declined the following medications this month: Levemir - Inject 70 units daily (patient prefers to get this from [facility]) Jardiance 10 mg - 1 tablet morning - gets from manufacturer Trulicity 6.78 mg  Inject once weekly  - gets from manufacturer  Any concerns about your medications? No  How often do you forget or accidentally miss a dose? Never  Is patient in packaging Yes  What is the date on your next pill pack? Patient away from home at this time   Any concerns or issues with your packaging? No concerns at this time   Refills requested from providers include: glipizide, lisinopril, atorvastatin re quested  Confirmed delivery date of 05/07/21, advised patient that pharmacy will contact them the morning of delivery.  No BP readings available   Recent blood glucose readings are as follows: Fasting: 04/25/21 - 184   04/24/21 - 186   04/23/21 -  166   04/22/21 - 192   04/21/21 -   236  Patient will add trulicity on mondays beginning 04/28/21. Patient to follow up with PCP in 2 weeks.  Annual wellness visit in last year? No Most Recent BP reading:110/64  101-P 04/21/21  If Diabetic: Most recent A1C reading: 10.5%  04/21/21 Last eye exam / retinopathy screening: up to date Last diabetic foot exam: 04/21/21  Debbora Dus, CPP notified  Avel Sensor, [place] Assistant [phone]  Time Spent: 40 min  I have reviewed the care management and care coordination activities outlined in this encounter and I am certifying that I agree with the content of this note. No further action required.  Debbora Dus, PharmD Clinical Pharmacist [facility] at [facility] [phone]

## 2021-04-28 ENCOUNTER — Ambulatory Visit: Payer: Medicare Other | Admitting: Nurse Practitioner

## 2021-04-28 ENCOUNTER — Encounter: Payer: Self-pay | Admitting: Nurse Practitioner

## 2021-04-28 ENCOUNTER — Other Ambulatory Visit (INDEPENDENT_AMBULATORY_CARE_PROVIDER_SITE_OTHER): Payer: Medicare Other

## 2021-04-28 VITALS — BP 80/50 | HR 104 | Ht 72.5 in | Wt 214.4 lb

## 2021-04-28 DIAGNOSIS — K625 Hemorrhage of anus and rectum: Secondary | ICD-10-CM

## 2021-04-28 DIAGNOSIS — K5900 Constipation, unspecified: Secondary | ICD-10-CM

## 2021-04-28 DIAGNOSIS — K648 Other hemorrhoids: Secondary | ICD-10-CM

## 2021-04-28 LAB — CBC
HCT: 41.5 % (ref 39.0–52.0)
Hemoglobin: 13.7 g/dL (ref 13.0–17.0)
MCHC: 33 g/dL (ref 30.0–36.0)
MCV: 89.7 fl (ref 78.0–100.0)
Platelets: 213 10*3/uL (ref 150.0–400.0)
RBC: 4.63 Mil/uL (ref 4.22–5.81)
RDW: 13.9 % (ref 11.5–15.5)
WBC: 9.4 10*3/uL (ref 4.0–10.5)

## 2021-04-28 NOTE — Progress Notes (Signed)
04/28/2021 Christian [name] [id number] Jun 08, 1940   Chief Complaint: Rectal bleeding   History of Present Illness: Christian [name] is a 81 year old male with a past medical history of arthritis, hypertension, hyperlipidemia, DM II, normocytic anemia and colon polyps. He presents to our office today as referred by Dr. Elsie [name] for further evaluation regarding bright red rectal bleeding.  He reported passing a moderate to large amount of bright red blood with a normal soft brown stool on 4 occasions since he underwent a colonoscopy with Dr. Silverio [name] 02/2020.  He denies passing only blood per the rectum during these episodes.  No associated abdominal or anal rectal pain.  The last episode of significant rectal bleeding was approximately 3 months ago.  He continues to see a small smear of bright red blood on the toilet tissue after passing a BM.  He typically passes a brown formed stool every 3 to 4 days.  He passed a normal soft light brown stool earlier today without any noticeable blood.  His most recent colonoscopy was 03/12/2020 and 3 tubular adenomatous polyps were removed from the left colon and moderate diverticulosis was noted to the ascending, transverse, descending and sigmoid colon with internal and external hemorrhoids.  He reports feeling a little off today, no dizziness or weakness, he has difficulty explaining how he feels.  No chest pain, palpitations or shortness of breath.  His first blood pressure reading in our office was 80/50 with a heart rate of 104.  Repeat blood pressure was 110/58.  Appetite remains good.  No weight loss.  No other complaints at this time   Colonoscopy 03/12/2020 by Dr. Silverio [name]: Three 5 to 11 mm polyps in the sigmoid colon and in the descending colon, removed with a hot snare. Resected and retrieved. Clip (MR conditional) was placed. - Moderate diverticulosis in the sigmoid colon, in the descending colon, in the transverse colon and in the ascending  colon. - Non-bleeding external and internal hemorrhoids. - The examination was otherwise normal. - No recall colonoscopy due to age  Path Report: TUBULAR ADENOMA(S). - NO HIGH GRADE DYSPLASIA OR CARCINOMA  CBC Latest Ref Rng & Units 08/30/2020 08/22/2020 08/19/2020  WBC 3.8 - 10.8 Thousand/uL 9.2 8.7 7.2  Hemoglobin 13.2 - 17.1 g/dL 12.4(L) 11.8(L) 10.7(L)  Hematocrit 38.5 - 50.0 % 38.0(L) 36.6(L) 33.6(L)  Platelets 140 - 400 Thousand/uL 222 232 185    CMP Latest Ref Rng & Units 08/30/2020 08/22/2020 08/20/2020  Glucose 65 - 99 mg/dL 264(H) 347(H) 304(H)  BUN 7 - 25 mg/dL 17 23 14   Creatinine 0.70 - 1.18 mg/dL 1.10 1.15 1.20  Sodium 135 - 146 mmol/L 143 134(L) 139  Potassium 3.5 - 5.3 mmol/L 4.8 5.0 5.0  Chloride 98 - 110 mmol/L 106 101 104  CO2 20 - 32 mmol/L 24 22 27   Calcium 8.6 - 10.3 mg/dL 9.1 8.8(L) 8.7(L)  Total Protein 6.5 - 8.1 g/dL - - -  Total Bilirubin 0.3 - 1.2 mg/dL - - -  Alkaline Phos 38 - 126 U/L - - -  AST 15 - 41 U/L - - -  ALT 0 - 44 U/L - - -     Past Medical History:  Diagnosis Date   Arthritis    Blood transfusion without reported diagnosis    ? in 2017 after crushing injury    Cataract    right eye removed    Crushing injury of pelvic region    Diabetes mellitus without complication (  [name])    Hyperlipidemia    Hypertension    Past Surgical History:  Procedure Laterality Date   ACHILLES TENDON SURGERY Right    AMPUTATION Right 08/21/2020   Procedure: RIGHT FOOT 5TH RAY AMPUTATION;  Surgeon: Christian Minion, MD;  Location: [place];  Service: Orthopedics;  Laterality: Right;   APPLICATION OF A-CELL OF BACK N/A 02/10/2016   Procedure: APPLICATION OF A-CELL OF sacral wound;  Surgeon: Christian Lofty Dillingham, DO;  Location: [place];  Service: Plastics;  Laterality: N/A;   APPLICATION OF WOUND VAC N/A 02/10/2016   Procedure: APPLICATION OF WOUND VAC possible;  Surgeon: Christian Lofty Dillingham, DO;  Location: [place];  Service: Plastics;  Laterality: N/A;   COLONOSCOPY      I & D EXTREMITY N/A 01/30/2016   Procedure: IRRIGATION AND DEBRIDEMENT OF THE SACRUM;  Surgeon: Christian Lofty Dillingham, DO;  Location: [place];  Service: Plastics;  Laterality: N/A;   INCISION AND DRAINAGE OF WOUND N/A 02/10/2016   Procedure: IRRIGATION AND DEBRIDEMENT sacral WOUND;  Surgeon: Christian Lofty Dillingham, DO;  Location: [place];  Service: Plastics;  Laterality: N/A;   IR GENERIC HISTORICAL  02/11/2016   IR CATHETER TUBE CHANGE 02/11/2016 Christian Edouard, MD [facility]   IR GENERIC HISTORICAL  03/16/2016   IR CATHETER TUBE CHANGE 03/16/2016 Christian Mariscal, MD [facility]INTER[facility]   IR GENERIC HISTORICAL  05/11/2016   IR CATHETER TUBE CHANGE 05/11/2016 Christian Edouard, MD [facility]   POLYPECTOMY     SACRO-ILIAC PINNING N/A 01/16/2016   Procedure: Christian [name];  Surgeon: Christian Blum, MD;  Location: [place];  Service: Orthopedics;  Laterality: N/A;   SUPRAPUBIC CATHETER INSERTION     s/p removal 2018   TONSILLECTOMY     ULNAR NERVE TRANSPOSITION Right    Current Outpatient Medications on File Prior to Visit  Medication Sig Dispense Refill   amLODipine (NORVASC) 5 MG tablet Take 1 tablet (5 mg total) by mouth daily. 90 tablet 2   atorvastatin (LIPITOR) 80 MG tablet TAKE ONE TABLET BY MOUTH EVERY EVENING 90 tablet 2   empagliflozin (JARDIANCE) 10 MG TABS tablet Take 1 tablet (10 mg total) by mouth daily before breakfast. 90 tablet 3   glipiZIDE (GLUCOTROL) 5 MG tablet TAKE TWO TABLETS BY MOUTH EVERY MORNING and TAKE ONE TABLET BY MOUTH EVERY EVENING 270 tablet 2   glucose blood (ONETOUCH ULTRA) test strip USE 1 STRIP TO CHECK GLUCOSE ONCE DAILY 100 each 3   hydrochlorothiazide (HYDRODIURIL) 12.5 MG tablet Take 1 tablet (12.5 mg total) by mouth daily. 90 tablet 3   insulin detemir (LEVEMIR FLEXTOUCH) 100 UNIT/ML FlexPen INJECT UP TO 70 UNITS SUBCUTANEOUSLY ONCE DAILY 45 mL 1   Insulin Pen Needle (RELION PEN NEEDLE 31G/8MM) 31G X 8 MM MISC USE TO INJECT LEVEMIR DAILY 100 each 3   lisinopril  (ZESTRIL) 30 MG tablet TAKE ONE TABLET BY MOUTH EVERY EVENING 30 tablet 2   nystatin cream (MYCOSTATIN) Apply 1 application topically 2 (two) times daily. 30 g 1   TRULICITY 0.17 BL/3.9QZ SOPN INJECT 0.75MG  (0.5ML) UNDER THE SKIN ONCE A WEEK 2 mL 4   No current facility-administered medications on file prior to visit.   No Known Allergies  Current Medications, Allergies, Past Medical History, Past Surgical History, Family History and Social History were reviewed in [employer] record.  Review of Systems:   Constitutional: Negative for fever, sweats, chills or weight loss.  Respiratory: Negative for shortness of breath.   Cardiovascular: Negative  for chest pain, palpitations and leg swelling.  Gastrointestinal: See HPI.  Musculoskeletal: Negative for back pain or muscle aches.  Neurological: Negative for dizziness, headaches or paresthesias.   Physical Exam: BP (!) 80/50 (BP Location: Left Arm, Patient Position: Sitting, Cuff Size: Normal)    Pulse (!) 104    Ht 6' 0.5" (1.842 m) Comment: height measured without shoes   Wt 214 lb 6 oz (97.2 kg)    BMI 28.67 kg/m  Repeat BP 110/58.   General: 81 year old male in no acute distress ambulating with assistance of a cane. Head: Normocephalic and atraumatic. Eyes: No scleral icterus. Conjunctiva pink . Ears: Normal auditory acuity. Mouth: Dentition intact. No ulcers or lesions.  Slightly yellowish coating on tongue. Lungs: Clear throughout to auscultation. Heart: Regular rate and rhythm, no murmur. Abdomen: Soft, nontender.  Protuberant.  No masses or hepatomegaly. Normal bowel sounds x 4 quadrants.  Rectal: inflamed friable erythematous left lateral anal hemorrhoids without active bleeding, smear of golden brown stool in rectal vault. Sophia CMA present during exam.  Musculoskeletal: Symmetrical with no gross deformities. Extremities: No edema. Neurological: Alert oriented x 4. No focal deficits.  Psychological:  Alert and cooperative. Normal mood and affect  Assessment and Recommendations:  62) 81 year old male with internal and external hemorrhoids with bright red rectal bleeding with bowel movements on 4 occasions since 02/2020, last episode occurred approximately 3 months ago. Rectal exam today showed inflamed left lateral anal hemorrhoid without active bleeding. -MiraLAX nightly as needed -Apply a small amount of Desitin inside the anal opening and to the external anal area tid as needed for anal or hemorrhoidal irritation/bleeding.  -Avoid aggressive wiping after passing a bowel movement -CBC -Further recommendation to be determined after CBC results reviewed -Patient to contact office if rectal bleeding persists or worsens  2) History of diverticulosis, his rectal bleeding sounds more hemorrhoidal than diverticular in etiology. -Patient to go to the ED if he develops severe rectal bleeding  3)History of tubular adenomatous colon polyps per colonoscopy 03/12/2020 -No further colon polyp surveillance colonoscopies due to age  16) Initial BP reading in office today was 80/50.  Heart rate 104.  Repeat BP was 110/58.  Patient reported feeling slightly unwell but could not specify further. -He was advised to go the ED if he developed any dizziness, chest pain, palpitations, shortness of breath or profound weakness -Patient to follow-up with PCP for BP recheck

## 2021-04-28 NOTE — Patient Instructions (Addendum)
LABS:  Lab work has been ordered for you today. Our lab is located in the basement. Press "B" on the elevator. The lab is located at the first door on the left as you exit the elevator.  HEALTHCARE LAWS AND MY CHART RESULTS: Due to recent changes in healthcare laws, you may see the results of your imaging and laboratory studies on MyChart before your provider has had a chance to review them.   We understand that in some cases there may be results that are confusing or concerning to you. Not all laboratory results come back in the same time frame and the provider may be waiting for multiple results in order to interpret others.  Please give [place] 48 hours in order for your provider to thoroughly review all the results before contacting the office for clarification of your results.   RECOMMENDATIONS: Desitin: Apply a small amount to the external and internal anal area three times a day for 5 days, then as needed. Miralax- Dissolve one capful in 8 ounces of water and drink before bed. Contact your primary care provider if you continue to feel unwell. Go to the emergency room if you develop profound fatigue, weakness, chest pain or shortness of breath.  It was great seeing you today! Thank you for entrusting me with your care and choosing [facility].  Noralyn Pick, CRNP  The [facility] providers would like to encourage you to use [facility] to communicate with providers for non-urgent requests or questions.  Due to long hold times on the telephone, sending your provider a message by [facility] may be faster and more efficient way to get a response. Please allow 48 business hours for a response.  Please remember that this is for non-urgent requests/questions. If you are age 62 or older, your body mass index should be between 23-30. Your Body mass index is 28.67 kg/m. If this is out of the aforementioned range listed, please consider follow up with your Primary Care Provider.  If you  are age 76 or younger, your body mass index should be between 19-25. Your Body mass index is 28.67 kg/m. If this is out of the aformentioned range listed, please consider follow up with your Primary Care Provider.

## 2021-04-29 ENCOUNTER — Telehealth: Payer: Self-pay

## 2021-04-29 NOTE — Progress Notes (Addendum)
° ° °  Chronic Care Management Pharmacy Assistant   Name: Christian [name]  MRN: [id number] DOB: 1941/03/03  Reason for Encounter: CCM (Appointment Reminder)  Medications: Outpatient Encounter Medications as of 04/29/2021  Medication Sig   amLODipine (NORVASC) 5 MG tablet Take 1 tablet (5 mg total) by mouth daily.   atorvastatin (LIPITOR) 80 MG tablet TAKE ONE TABLET BY MOUTH EVERY EVENING   empagliflozin (JARDIANCE) 10 MG TABS tablet Take 1 tablet (10 mg total) by mouth daily before breakfast.   glipiZIDE (GLUCOTROL) 5 MG tablet TAKE TWO TABLETS BY MOUTH EVERY MORNING and TAKE ONE TABLET BY MOUTH EVERY EVENING   glucose blood (ONETOUCH ULTRA) test strip USE 1 STRIP TO CHECK GLUCOSE ONCE DAILY   hydrochlorothiazide (HYDRODIURIL) 12.5 MG tablet Take 1 tablet (12.5 mg total) by mouth daily.   insulin detemir (LEVEMIR FLEXTOUCH) 100 UNIT/ML FlexPen INJECT UP TO 70 UNITS SUBCUTANEOUSLY ONCE DAILY   Insulin Pen Needle (RELION PEN NEEDLE 31G/8MM) 31G X 8 MM MISC USE TO INJECT LEVEMIR DAILY   lisinopril (ZESTRIL) 30 MG tablet TAKE ONE TABLET BY MOUTH EVERY EVENING   nystatin cream (MYCOSTATIN) Apply 1 application topically 2 (two) times daily.   TRULICITY 4.01 UU/7.2ZD SOPN INJECT 0.75MG  (0.5ML) UNDER THE SKIN ONCE A WEEK   No facility-administered encounter medications on file as of 04/29/2021.    BP Readings from Last 3 Encounters:  04/28/21 (!) 80/50  04/21/21 110/64  11/07/20 120/64    Lab Results  Component Value Date   HGBA1C 10.5 (A) 04/21/2021    [name] was contacted to remind of upcoming telephone visit with Debbora Dus on 05/06/2021 at 4:00. Patient was reminded to have all medications, supplements and any blood glucose and blood pressure readings available for review at appointment. If unable to reach, a voicemail was left for patient.   Are you having any problems with your medications? Yes Patient states he has tried using two Trulicity pens now and is not doing it right.  Patient states he has wasted two because he is doing it wrong. Patient would like for someone in the office to show him how to use it. Patient states he lives close to the [place] location. I offered to send patient a instructional video, but he does not have Internet access. Notified Debbora Dus, CPP.  Do you have any concerns you like to discuss with the pharmacist? Yes - See above  Star Rating Drugs: Medication:  Last Fill: Day Supply Trulicity 6.64 mg PAP Atorvastatin 80 mg 04/01/2021 30 Glipizide 5 mg 04/01/2021 30   Lisinopril 30 mg 04/01/2021 30 Jardiance 10 mg 02/28/2021 [place], CPP notified  [name], [place] Assistant [phone]  I have reviewed the care management and care coordination activities outlined in this encounter and I am certifying that I agree with the content of this note. See addendum.  Debbora Dus, PharmD Clinical Pharmacist [facility] at [facility] [phone]

## 2021-04-29 NOTE — Telephone Encounter (Signed)
Called patient and reviewed Trulicity admin. He did not remove gray cap on first administration. He will try this again and update me if any issues.

## 2021-04-30 ENCOUNTER — Telehealth: Payer: Self-pay | Admitting: Family Medicine

## 2021-04-30 NOTE — Telephone Encounter (Signed)
Patient has lower blood pressure at GI visit.  Please check on patient, get updated blood pressure reading at home.  Thanks.

## 2021-04-30 NOTE — Progress Notes (Signed)
Called patient to inform him it was too soon to refill his test strips. Next fill date is 07/05/2021. Patient understood. Patient just picked them up at [facility] on 04/21/2021.   Debbora Dus, CPP notified  [name], [place] Clinical Pharmacy Assistant [phone]  Time Spent: 3 Minutes

## 2021-05-01 NOTE — Telephone Encounter (Signed)
Did not have readings with him when I called. States that that readings have been normal at home. Denies any symptoms at this time. Advised that if he has any readings that are out of normal range or symptoms to give our office a call and let us know.

## 2021-05-01 NOTE — Telephone Encounter (Signed)
Noted. Thanks.

## 2021-05-06 ENCOUNTER — Other Ambulatory Visit: Payer: Self-pay

## 2021-05-06 ENCOUNTER — Ambulatory Visit (INDEPENDENT_AMBULATORY_CARE_PROVIDER_SITE_OTHER): Payer: Medicare Other

## 2021-05-06 DIAGNOSIS — I10 Essential (primary) hypertension: Secondary | ICD-10-CM

## 2021-05-06 DIAGNOSIS — E1142 Type 2 diabetes mellitus with diabetic polyneuropathy: Secondary | ICD-10-CM

## 2021-05-06 DIAGNOSIS — E785 Hyperlipidemia, unspecified: Secondary | ICD-10-CM

## 2021-05-06 NOTE — Progress Notes (Signed)
Reviewed and agree with documentation and assessment and plan. K. Veena Larenz Frasier , MD   

## 2021-05-06 NOTE — Progress Notes (Signed)
Chronic Care Management  Pharmacy Note  05/06/2021 Name:  Christian [name] MRN:  [id number] DOB:  06/12/1940  Summary:  CCM follow up visit. Discussed uncontrolled diabetes, A1c 10.4%. Pt recently started Trulicity 7.06 mg. Some difficulty with first 2 injections (did not remove cap). He took first dose successfully on Thursday (05/01/21). No ADEs thus far. Fasting BG has ranged 180-200s this week. He has plenty Trulicity on hand (receives from PAP). I asked him to let me know if < 4 pens so we can request early refill (2 pens wasted due to user error). Diet remain high in carbohydrates with room for improvement. HTN, controlled. Pt monitors BP daily and home readings ~ 120/70. Pt reports recent rectal bleeding has resolved.  Recommendation:  Continue Trulicity x 4 weeks, then increase to 1.5 mg if tolerated.   Plan:  CCM PharmD follow up 4 weeks Health Concierge call in 2 weeks for food diary   Subjective: Christian [name] is an 81 y.o. year old male who is a primary patient of [name], [name], MD.  The CCM team was consulted for assistance with disease management and care coordination needs.    Engaged with patient by telephone for follow up visit in response to provider referral for pharmacy case management   Consent to Services:  The patient was given information about Chronic Care Management services, agreed to services, and gave verbal consent prior to initiation of services.  Please see initial visit note for detailed documentation.   Patient Care Team: Tonia Ghent, MD as PCP - General (Family Medicine) Debbora Dus, [facility] as Pharmacist (Pharmacist)  Recent office visits: 04/21/21 - Elsie Stain, MD - Pt presented for diabetes follow up. A1c increased from 9.1 to 10.5%. Foot exam normal. Rectal bleeding, refer to GI. Fungal infection, start nystatin. Follow up 3 months. 03/26/21 - CCM diabetes follow up - Pt approved for PAP (Trulicity 2.37 mg weekly and Jardiance 10 mg), Start  Trulicity this week. Improve diabetes diet/exercise.  Recent consult visits: 04/28/21 - Gastroenterology - Pt presented for rectal bleeding. Exam showed internal and external hemorrhoids without active bleeding. Use MiraLAX nightly PRN. Apply Desitin. Update CBC. BP 80/50, recheck BP came up to 110/58.   Hospital visits: None since last CCM contact  Objective:  Lab Results  Component Value Date   CREATININE 1.10 08/30/2020   BUN 17 08/30/2020   GFR 66.20 08/08/2020   GFRNONAA >60 08/22/2020   GFRAA >60 05/17/2016   NA 143 08/30/2020   K 4.8 08/30/2020   CALCIUM 9.1 08/30/2020   CO2 24 08/30/2020   GLUCOSE 264 (H) 08/30/2020    Lab Results  Component Value Date/Time   HGBA1C 10.5 (A) 04/21/2021 10:01 AM   HGBA1C 9.1 (H) 08/08/2020 10:03 AM   HGBA1C 9.5 (A) 04/16/2020 09:27 AM   HGBA1C 9.8 (H) 01/04/2020 08:02 AM   GFR 66.20 08/08/2020 10:03 AM   GFR 49.73 (L) 05/09/2020 11:23 AM   MICROALBUR 1.6 01/04/2020 08:02 AM   MICROALBUR 0.7 03/07/2018 09:41 AM    Last diabetic Eye exam:  Lab Results  Component Value Date/Time   HMDIABEYEEXA No Retinopathy 08/30/2020 12:00 AM    Last diabetic Foot exam: 08/08/20  PCP diabetic foot ulcer  Lab Results  Component Value Date   CHOL 85 01/04/2020   HDL 27.70 (L) 01/04/2020   LDLDIRECT 19.0 01/04/2020   TRIG 391.0 (H) 01/04/2020   CHOLHDL 3 01/04/2020    Hepatic Function Latest Ref Rng & Units 08/18/2020  01/04/2020 03/01/2018  Total Protein 6.5 - 8.1 g/dL 6.7 7.3 7.3  Albumin 3.5 - 5.0 g/dL 3.3(L) 4.2 4.3  AST 15 - 41 U/L _0 ALT 0 - 44 U/L _1 Alk Phosphatase 38 - 126 U/L 55 64 74  Total Bilirubin 0.3 - 1.2 mg/dL 0.9 0.9 0.8    Lab Results  Component Value Date/Time   TSH 3.337 03/06/2016 03:45 AM    CBC Latest Ref Rng & Units 04/28/2021 08/30/2020 08/22/2020  WBC 4.0 - 10.5 K/uL 9.4 9.2 8.7  Hemoglobin 13.0 - 17.0 g/dL 13.7 12.4(L) 11.8(L)  Hematocrit 39.0 - 52.0 % 41.5 38.0(L) 36.6(L)  Platelets 150.0 -  400.0 K/uL 213.0 222 232   Clinical ASCVD: No  The ASCVD Risk score (Arnett DK, et al., 2019) failed to calculate for the following reasons:   The valid systolic blood pressure range is 90 to 200 mmHg   The valid total cholesterol range is 130 to 320 mg/dL    Depression screen [facility] 2/9 04/21/2021 04/16/2020 03/08/2018  Decreased Interest 0 0 0  Down, Depressed, Hopeless 0 0 0  PHQ - 2 Score 0 0 0  Some recent data might be hidden    Social History   Tobacco Use  Smoking Status Never  Smokeless Tobacco Never   BP Readings from Last 3 Encounters:  04/28/21 (!) 80/50  04/21/21 110/64  11/07/20 120/64   Pulse Readings from Last 3 Encounters:  04/28/21 (!) 104  04/21/21 (!) 101  11/07/20 (!) 57   Wt Readings from Last 3 Encounters:  04/28/21 214 lb 6 oz (97.2 kg)  04/21/21 210 lb (95.3 kg)  11/07/20 219 lb 8 oz (99.6 kg)   BMI Readings from Last 3 Encounters:  04/28/21 28.67 kg/m  04/21/21 26.96 kg/m  11/07/20 28.18 kg/m    Assessment/Interventions: Review of patient past medical history, allergies, medications, health status, including review of consultants reports, laboratory and other test data, was performed as part of comprehensive evaluation and provision of chronic care management services.   SDOH:  (Social Determinants of Health) assessments and interventions performed: Yes  SDOH Screenings   Alcohol Screen: Not on file  Depression (PHQ2-9): Low Risk    PHQ-2 Score: 0  Financial Resource Strain: Low Risk    Difficulty of Paying Living Expenses: Not very hard  Food Insecurity: Not on file  Housing: Not on file  Physical Activity: Not on file  Social Connections: Not on file  Stress: Not on file  Tobacco Use: Low Risk    Smoking Tobacco Use: Never   Smokeless Tobacco Use: Never   Passive Exposure: Not on file  Transportation Needs: Not on file    [place]  No Known Allergies  Medications Reviewed Today     Reviewed by Debbora Dus, [facility]  (Pharmacist) on 05/07/21 at 1357  Med List Status: <None>   Medication Order Taking? Sig Documenting Provider Last Dose Status Informant  amLODipine (NORVASC) 5 MG tablet [id number] Yes Take 1 tablet (5 mg total) by mouth daily. Tonia Ghent, MD Taking Active   atorvastatin (LIPITOR) 80 MG tablet [id number] Yes TAKE ONE TABLET BY MOUTH EVERY EVENING Tonia Ghent, MD Taking Active   empagliflozin (JARDIANCE) 10 MG TABS tablet [id number] Yes Take 1 tablet (10 mg total) by mouth daily before breakfast. Tonia Ghent, MD Taking Active   glipiZIDE (GLUCOTROL) 5 MG tablet [id number] Yes TAKE TWO TABLETS BY MOUTH EVERY MORNING and TAKE ONE TABLET  BY MOUTH EVERY EVENING Tonia Ghent, MD Taking Active   glucose blood [facility] ULTRA) test strip [id number]  USE 1 STRIP TO CHECK GLUCOSE ONCE DAILY Tonia Ghent, MD  Active   hydrochlorothiazide (HYDRODIURIL) 12.5 MG tablet [id number] Yes Take 1 tablet (12.5 mg total) by mouth daily. Tonia Ghent, MD Taking Active   insulin detemir (LEVEMIR FLEXTOUCH) 100 UNIT/ML FlexPen [id number] Yes INJECT UP TO 70 UNITS SUBCUTANEOUSLY ONCE DAILY Tonia Ghent, MD Taking Active   Insulin Pen Needle (RELION PEN NEEDLE 31G/8MM) 31G X 8 MM MISC [id number] Yes USE TO INJECT LEVEMIR DAILY Tonia Ghent, MD Taking Active   lisinopril (ZESTRIL) 30 MG tablet [id number] Yes TAKE ONE TABLET BY MOUTH EVERY EVENING Tonia Ghent, MD Taking Active   nystatin cream (MYCOSTATIN) [id number]  Apply 1 application topically 2 (two) times daily. Tonia Ghent, MD  Active   TRULICITY 5.05 LZ/7.6BH Bonney Aid [id number] Yes INJECT 0.75MG (0.5ML) UNDER THE SKIN ONCE A WEEK Tonia Ghent, MD Taking Active             Patient Active Problem List   Diagnosis Date Noted   BRBPR (bright red blood per rectum) 04/23/2021   Lumbar spondylosis 12/26/2020   Osteomyelitis of fifth toe of right foot ([place])    Diabetic foot infection ([place]) 08/18/2020   Diabetic foot ulcer ([place])  08/11/2020   Healthcare maintenance 01/10/2020   Lower back pain 09/27/2019   Thumb pain 06/25/2019   Medicare annual wellness visit, subsequent 03/12/2018   Advance care planning 03/12/2018   Shoulder pain 02/03/2017   Edema 11/02/2016   Diabetes mellitus with microalbuminuria ([place]) 06/10/2016   HLD (hyperlipidemia) 06/10/2016   DM type 2 with diabetic peripheral neuropathy ([place])    HTN (hypertension)    Accident caused by farm tractor 01/15/2016   Colon polyps 06/24/2013    Immunization History  Administered Date(s) Administered   Fluad Quad(high Dose 65+) 01/05/2019, 01/09/2020, 04/01/2021   Influenza,inj,Quad PF,6+ Mos 02/20/2013, 01/03/2014, 02/05/2015, 01/15/2016, 03/26/2016, 02/02/2017   Influenza-Unspecified 04/13/2010, 01/11/2018   PFIZER(Purple Top)SARS-COV-2 Vaccination 05/23/2019, 06/17/2019   Pneumococcal Conjugate-13 01/06/2012, 01/03/2014   Pneumococcal Polysaccharide-23 12/19/2012   Td 05/14/2014   Zoster Recombinat (Shingrix) 10/27/2018, 01/31/2019   Zoster, Live 12/19/2014    Conditions to be addressed/monitored:  Hypertension and Diabetes  Care Plan : [place]  Updates made by Debbora Dus, [place] since 05/07/2021 12:00 AM     Problem: CHL AMB "PATIENT-SPECIFIC PROBLEM"      Long-Range Goal: Disease Management   Start Date: 07/18/2020  Recent Progress: Not on track  Priority: High  Note:    Current Barriers: Unable to achieve control of diabetes - worsening    Pharmacist Clinical Goal(s):  Patient will adhere to plan to optimize therapeutic regimen for diabetes as evidenced by report of adherence to recommended medication management changes through collaboration with PharmD and provider.   Interventions: 1:1 collaboration with Tonia Ghent, MD regarding development and update of comprehensive plan of care as evidenced by provider attestation and co-signature Inter-disciplinary care team collaboration (see longitudinal plan of  care) Comprehensive medication review performed; medication list updated in electronic medical record   Hypertension (BP goal <130/80) -Controlled - reviewed patient's daily BP log and readings remain < 130/80, none < 90/60. No dizziness. Patient confirms daily adherence in pill packs and takes morning meds at breakfast, night meds at 8:45 PM. He has been instructed to limit salt and low sodium diet was reviewed by  RN visit 09/11/20. He avoids frequent use of NSAIDs.  -Current treatment: Amlodipine 5 mg - 1 tablet daily - Appropriate, Effective, Safe, Accessible Lisinopril 30 mg - 1 tablet daily - Appropriate, Effective, Safe, Accessible HCTZ 12.5 mg - 1 tablet daily - Appropriate, Effective, Safe, Accessible -Medications previously tried: none -Current home readings: ~114/80, 80 bpm (uses an arm cuff for at home monitoring) -Denies hypotensive/hypertensive symptoms -Continue to monitor BP at home daily, document, and provide log at future appointments -Recommended continue current medications   Diabetes (A1c goal <7%) -Not ideally controlled - A1c, 10.4%. He is tolerating Trulicity well after 1st dose on 05/01/21, no GI upset. Continues high carb intake, eats out often. Reminded him to eat small meals to reduce likelihood of GI upset. He denies any change in appetite so far. He checks BG routinely each morning. Fasting BG 180-200s.  Diet seems to be primary modifiable risk factor. -Current medications: Levemir - Inject 70 units daily at bedtime (split in 2 doses) Glipizide 5 mg - 2 in the morning and 1 at night Jardiance 10 mg - 1 tablet daily  Trulicity 7.78 mg - Inject once weekly (started 05/01/21) -Medications previously tried: metformin 500 mg - severe GI upset  -Current home glucose readings: he did not have readings with him today Fasting: 200s (this morning), running from 180-210 in morning     04/25/21 - 184     04/24/21 - 186     04/23/21 - 166     04/22/21 - 192     04/21/21 -    236 -Current meal patterns: high carb diet  -Recommend continue Trulicity 2.42 mg weekly x 4 weeks, then increase to 1.5 mg weekly if tolerated. Recommend start food diary for 1 week.   Patient Goals/Self-Care Activities Patient will: - take medications as prescribed - check glucose daily, document, and provide at future appointments - check blood pressure with abnormal symptoms, document, and provide at future appointments  Follow Up Plan: Telephone follow up appointment with care management team member scheduled for: CCM PharmD follow up 4 weeks Health Concierge call in 2 weeks for food diary      Medication Assistance:  Jardiance 10 mg and Trulicity 3.53 mg obtained through [name] medication assistance program.  Enrollment ends 04/12/2022    Star Rating Drugs:  Medication:                Last Fill:         Day Supply Trulicity 6.14 mg       PAP Atorvastatin 80 mg   04/01/2021      30 Glipizide 5 mg           04/01/2021      30                     Lisinopril 30 mg        04/01/2021      30 Jardiance 10 mg       PAP   Patient's preferred pharmacy is: [facility] Pt receives the following in adherence packaging:  Packs:  Amlodipine 2.5 mg - 2 tabs at breakfast Atorvastatin 80 mg - 1 evening meal  Glipizide 5 mg - 2 breakfast, 1 evening meal Lisinopril 30 mg - 1 evening meal  Prevagen Regular Strength - 1 at breakfast  Care Plan and Follow Up Patient Decision:  Patient agrees to Care Plan and Follow-up.  Debbora Dus, PharmD Clinical Pharmacist [facility] at  [place] [phone]

## 2021-05-07 NOTE — Patient Instructions (Addendum)
Dear Christian [name],  Below is a summary of the goals we discussed during our follow up appointment on May 06, 2021. Please contact me anytime with questions or concerns.   Visit Information  Patient Care Plan: CCM Pharmacy Care Plan     Problem Identified: CHL AMB "PATIENT-SPECIFIC PROBLEM"      Long-Range Goal: Disease Management   Start Date: 07/18/2020  Recent Progress: Not on track  Priority: High  Note:    Current Barriers: Unable to achieve control of diabetes - worsening    Pharmacist Clinical Goal(s):  Patient will adhere to plan to optimize therapeutic regimen for diabetes as evidenced by report of adherence to recommended medication management changes through collaboration with PharmD and provider.   Interventions: 1:1 collaboration with Tonia Ghent, MD regarding development and update of comprehensive plan of care as evidenced by provider attestation and co-signature Inter-disciplinary care team collaboration (see longitudinal plan of care) Comprehensive medication review performed; medication list updated in electronic medical record   Hypertension (BP goal <130/80) -Controlled - reviewed patient's daily BP log and readings remain < 130/80, none < 90/60. No dizziness. Patient confirms daily adherence in pill packs and takes morning meds at breakfast, night meds at 8:45 PM. He has been instructed to limit salt and low sodium diet was reviewed by RN visit 09/11/20. He avoids frequent use of NSAIDs.  -Current treatment: Amlodipine 5 mg - 1 tablet daily - Appropriate, Effective, Safe, Accessible Lisinopril 30 mg - 1 tablet daily - Appropriate, Effective, Safe, Accessible HCTZ 12.5 mg - 1 tablet daily - Appropriate, Effective, Safe, Accessible -Medications previously tried: none -Current home readings: ~114/80, 80 bpm (uses an arm cuff for at home monitoring) -Denies hypotensive/hypertensive symptoms -Continue to monitor BP at home daily, document, and provide log at  future appointments -Recommended continue current medications   Diabetes (A1c goal <7%) -Not ideally controlled - A1c, 10.4%. He is tolerating Trulicity well after 1st dose on 05/01/21, no GI upset. Continues high carb intake, eats out often. Reminded him to eat small meals to reduce likelihood of GI upset. He denies any change in appetite so far. He checks BG routinely each morning. Fasting BG 180-200s.  Diet seems to be primary modifiable risk factor. -Current medications: Levemir - Inject 70 units daily at bedtime (split in 2 doses) Glipizide 5 mg - 2 in the morning and 1 at night Jardiance 10 mg - 1 tablet daily  Trulicity 8.34 mg - Inject once weekly (started 05/01/21) -Medications previously tried: metformin 500 mg - severe GI upset  -Current home glucose readings: he did not have readings with him today Fasting: 200s (this morning), running from 180-210 in morning     04/25/21 - 184     04/24/21 - 186     04/23/21 - 166     04/22/21 - 192     04/21/21 -   236 -Current meal patterns: high carb diet  -Recommend continue Trulicity 1.96 mg weekly x 4 weeks, then increase to 1.5 mg weekly if tolerated. Recommend start food diary for 1 week.   Patient Goals/Self-Care Activities Patient will: - take medications as prescribed - check glucose daily, document, and provide at future appointments - check blood pressure with abnormal symptoms, document, and provide at future appointments  Follow Up Plan: Telephone follow up appointment with care management team member scheduled for: CCM PharmD follow up 4 weeks Health Concierge call in 2 weeks for food diary       Patient  verbalizes understanding of instructions and care plan provided today and agrees to view in [place]. Active MyChart status confirmed with patient.    Debbora Dus, PharmD Clinical Pharmacist Practitioner [facility] at [facility] [phone]

## 2021-05-13 DIAGNOSIS — E1142 Type 2 diabetes mellitus with diabetic polyneuropathy: Secondary | ICD-10-CM | POA: Diagnosis not present

## 2021-05-13 DIAGNOSIS — E785 Hyperlipidemia, unspecified: Secondary | ICD-10-CM | POA: Diagnosis not present

## 2021-05-13 DIAGNOSIS — I10 Essential (primary) hypertension: Secondary | ICD-10-CM | POA: Diagnosis not present

## 2021-05-22 DIAGNOSIS — E119 Type 2 diabetes mellitus without complications: Secondary | ICD-10-CM | POA: Diagnosis not present

## 2021-05-22 DIAGNOSIS — H5212 Myopia, left eye: Secondary | ICD-10-CM | POA: Diagnosis not present

## 2021-05-22 DIAGNOSIS — H2512 Age-related nuclear cataract, left eye: Secondary | ICD-10-CM | POA: Diagnosis not present

## 2021-05-26 ENCOUNTER — Telehealth: Payer: Self-pay

## 2021-05-26 NOTE — Telephone Encounter (Signed)
Just wanted to share patient blood glucose log from this week. We are excited about the improvement on Trulicity. He is feeling a lot better too!  02/13               155                            02/12               124                              02/11               124                    02/10               101                           02/09               108                               02/08               109                             02/07               111                             02/06               164

## 2021-05-26 NOTE — Progress Notes (Addendum)
Chronic Care Management Pharmacy Assistant   Name: Christian [name]  MRN: [id number] DOB: 1940/10/06   Reason for Encounter: CCM (Medication Adherence and Delivery Coordination)   Recent office visits:  None since last CCM Contact  Recent consult visits:  None since last CCM contact  Hospital visits:  None in previous 6 months  Medications: Outpatient Encounter Medications as of 05/26/2021  Medication Sig   amLODipine (NORVASC) 5 MG tablet Take 1 tablet (5 mg total) by mouth daily.   atorvastatin (LIPITOR) 80 MG tablet TAKE ONE TABLET BY MOUTH EVERY EVENING   empagliflozin (JARDIANCE) 10 MG TABS tablet Take 1 tablet (10 mg total) by mouth daily before breakfast.   glipiZIDE (GLUCOTROL) 5 MG tablet TAKE TWO TABLETS BY MOUTH EVERY MORNING and TAKE ONE TABLET BY MOUTH EVERY EVENING   glucose blood (ONETOUCH ULTRA) test strip USE 1 STRIP TO CHECK GLUCOSE ONCE DAILY   hydrochlorothiazide (HYDRODIURIL) 12.5 MG tablet Take 1 tablet (12.5 mg total) by mouth daily.   insulin detemir (LEVEMIR FLEXTOUCH) 100 UNIT/ML FlexPen INJECT UP TO 70 UNITS SUBCUTANEOUSLY ONCE DAILY   Insulin Pen Needle (RELION PEN NEEDLE 31G/8MM) 31G X 8 MM MISC USE TO INJECT LEVEMIR DAILY   lisinopril (ZESTRIL) 30 MG tablet TAKE ONE TABLET BY MOUTH EVERY EVENING   nystatin cream (MYCOSTATIN) Apply 1 application topically 2 (two) times daily.   TRULICITY 6.26 RS/8.5IO SOPN INJECT 0.75MG  (0.5ML) UNDER THE SKIN ONCE A WEEK   No facility-administered encounter medications on file as of 05/26/2021.   BP Readings from Last 3 Encounters:  04/28/21 (!) 80/50  04/21/21 110/64  11/07/20 120/64    Lab Results  Component Value Date   HGBA1C 10.5 (A) 04/21/2021    No OVs, Consults, or hospital visits since last care coordination call / Pharmacist visit. No medication changes indicated  Last adherence delivery date: 05/07/2021      Patient is due for next adherence delivery on:  06/05/2021 - Patient will need a new  delivery date as med sync does not match.   Spoke with patient on 05/26/2020 reviewed medications and coordinated delivery.  This delivery to include: Adherence Packaging   30 days  Packs: Atorvastatin 80mg  take 1 tablet evening meal Glipizide 5 mg - 2 tablets breakfast,1 tablet evening meal  Lisinopril 30mg  take 1 tablet evening meal Prevagen chew 30 take 1 tablet breakfast Amlodipine 5mg  take 1 tablet breakfast Hydrochlorothiazide 12.5mg  take 1 tablet breakfast   VIAL medications: Trueplus Pen Needles  Can not be filled until 07/05/2021 One Touch Test Strips   Patient declined the following medications this month: Levemir - Inject 70 units daily (patient prefers to get this from [facility]) Jardiance 10 mg - 1 tablet morning - gets from manufacturer Trulicity 2.70 mg  Inject once weekly  - gets from manufacturer  Any concerns about your medications? No  How often do you forget or accidentally miss a dose? Rarely  Do you use a pillbox? No  Is patient in packaging Yes  If yes  What is the date on your next pill pack? 05/11/2021  - Dinner pack  Any concerns or issues with your packaging? No  Spoke with patient about his packs. He states he takes his medications as he should. Patient states [facility] has been bringing his medications too early. Patient took his breakfast pack for 05/11/2021 today. Next pack for him to take is 05/11/2021 for dinner. Patient started new pack labeled 01/28 on 02/12. Patient's new sync date would  be 06/24/2021. Pharmacy will decide new delivery date.    No refill request needed.  Recent readings are as follows:  Date  Blood Glucose Blood Pressure Pulse 02/13  155   97/82   80  PT had [employer] for dinner with chicken, biscuit and gravy 02/12  124   141/73   64 02/11  124   148/52   70 02/10  101   119/78   75 02/09  108   121/73   79 02/08  109   116/81   81 02/07  111   114/67   84 02/06  164   103/66   89  Annual wellness visit in last  year? No Most Recent BP reading: 80/50 on 04/28/2021  If Diabetic: Most recent A1C reading: 10.5 on 04/21/2021 Last eye exam / retinopathy screening: Up to date Last diabetic foot exam: Up to date  Debbora Dus, CPP notified  [name], [place] Assistant [phone]  I have reviewed the care management and care coordination activities outlined in this encounter and I am certifying that I agree with the content of this note. No further action required.  Debbora Dus, PharmD Clinical Pharmacist [facility] [facility] at [facility] [phone]

## 2021-05-26 NOTE — Telephone Encounter (Signed)
Noted and glad to hear.  Many thanks.

## 2021-05-28 DIAGNOSIS — H2512 Age-related nuclear cataract, left eye: Secondary | ICD-10-CM | POA: Diagnosis not present

## 2021-05-29 ENCOUNTER — Telehealth: Payer: Self-pay

## 2021-05-29 NOTE — Progress Notes (Signed)
° ° °  Chronic Care Management Pharmacy Assistant   Name: Christian [name]  MRN: [id number] DOB: 08/20/1940  Reason for Encounter: CCM (Appointment Reminder)  Medications: Outpatient Encounter Medications as of 05/29/2021  Medication Sig   amLODipine (NORVASC) 5 MG tablet Take 1 tablet (5 mg total) by mouth daily.   atorvastatin (LIPITOR) 80 MG tablet TAKE ONE TABLET BY MOUTH EVERY EVENING   empagliflozin (JARDIANCE) 10 MG TABS tablet Take 1 tablet (10 mg total) by mouth daily before breakfast.   glipiZIDE (GLUCOTROL) 5 MG tablet TAKE TWO TABLETS BY MOUTH EVERY MORNING and TAKE ONE TABLET BY MOUTH EVERY EVENING   glucose blood (ONETOUCH ULTRA) test strip USE 1 STRIP TO CHECK GLUCOSE ONCE DAILY   hydrochlorothiazide (HYDRODIURIL) 12.5 MG tablet Take 1 tablet (12.5 mg total) by mouth daily.   insulin detemir (LEVEMIR FLEXTOUCH) 100 UNIT/ML FlexPen INJECT UP TO 70 UNITS SUBCUTANEOUSLY ONCE DAILY   Insulin Pen Needle (RELION PEN NEEDLE 31G/8MM) 31G X 8 MM MISC USE TO INJECT LEVEMIR DAILY   lisinopril (ZESTRIL) 30 MG tablet TAKE ONE TABLET BY MOUTH EVERY EVENING   nystatin cream (MYCOSTATIN) Apply 1 application topically 2 (two) times daily.   TRULICITY 8.18 HT/0.9PJ SOPN INJECT 0.75MG  (0.5ML) UNDER THE SKIN ONCE A WEEK   No facility-administered encounter medications on file as of 05/29/2021.   Christian [name] was contacted to remind of upcoming telephone visit with [name] on 06/04/2021 at 3:45 pm. Patient was reminded to have all medications, supplements and any blood glucose and blood pressure readings available for review at appointment. If unable to reach, a voicemail was left for patient.   Are you having any problems with your medications? No   Do you have any concerns you like to discuss with the pharmacist? No  Star Rating Drugs: Medication:  Last Fill: Day Supply Trulicity 1.21 mg PAP Atorvastatin 80 mg 04/30/2021 30 Glipizide 5 mg 04/30/2021 30  Lisinopril 30  mg 04/30/2021 [place], CPP notified  [name], [place]  Time Spent: 10 Minutes

## 2021-06-04 ENCOUNTER — Other Ambulatory Visit: Payer: Self-pay

## 2021-06-04 ENCOUNTER — Ambulatory Visit (INDEPENDENT_AMBULATORY_CARE_PROVIDER_SITE_OTHER): Payer: Medicare Other | Admitting: Pharmacist

## 2021-06-04 ENCOUNTER — Telehealth: Payer: Self-pay | Admitting: Pharmacist

## 2021-06-04 DIAGNOSIS — E1142 Type 2 diabetes mellitus with diabetic polyneuropathy: Secondary | ICD-10-CM

## 2021-06-04 DIAGNOSIS — I10 Essential (primary) hypertension: Secondary | ICD-10-CM

## 2021-06-04 DIAGNOSIS — E785 Hyperlipidemia, unspecified: Secondary | ICD-10-CM

## 2021-06-04 NOTE — Patient Instructions (Signed)
Visit Information  Phone number for Pharmacist: [phone]   Goals Addressed   None     Care Plan : [place]  Updates made by Charlton Haws, RPH since 06/04/2021 12:00 AM     Problem: Hypertension, Hyperlipidemia, and Diabetes   Priority: High     Long-Range Goal: Disease Management   Start Date: 07/18/2020  Expected End Date: 06/04/2022  This Visit's Progress: On track  Recent Progress: Not on track  Priority: High  Note:   Current Barriers: Unable to achieve control of diabetes - worsening    Pharmacist Clinical Goal(s):  Patient will adhere to plan to optimize therapeutic regimen for diabetes as evidenced by report of adherence to recommended medication management changes through collaboration with PharmD and provider.   Interventions: 1:1 collaboration with Tonia Ghent, MD regarding development and update of comprehensive plan of care as evidenced by provider attestation and co-signature Inter-disciplinary care team collaboration (see longitudinal plan of care) Comprehensive medication review performed; medication list updated in electronic medical record   Hypertension (BP goal <130/80) -Controlled - per patient reported home log -Current home readings: ~114/80, 80 bpm (uses an arm cuff for at home monitoring) -Current treatment: Amlodipine 5 mg - 1 tablet daily - Appropriate, Effective, Safe, Accessible Lisinopril 30 mg - 1 tablet daily - Appropriate, Effective, Safe, Accessible HCTZ 12.5 mg - 1 tablet daily - Appropriate, Effective, Safe, Accessible -Medications previously tried: none -Continue to monitor BP at home daily, document, and provide log at future appointments -Recommended continue current medications   Diabetes (A1c goal <7%) -Uncontrolled, improving - A1c 10.4% Jan 4332, started Trulicity 9/51/88 and home BG improving overall, however he did forget his Trulicity pens at home this week while visiting family so missed his weekly  dose and BG is worse this week -Current home glucose readings: Fasting: 109-140; this week w/o Trulicity [phone] -Current medications: Levemir - Inject 70 units daily at bedtime (split in 2 doses) - Appropriate, Effective, Safe, Accessible Glipizide 5 mg - 2 in the morning and 1 at night - Appropriate, Effective, Safe, Accessible Jardiance 10 mg - 1 tablet daily  - Appropriate, Effective, Safe, Accessible Trulicity 3.01 mg - Inject once weekly (started 05/01/21) - Appropriate, Effective, Safe, Accessible -Medications previously tried: metformin 500 mg - severe GI upset  -Current meal patterns: high carb diet  -Advised to resume his Trulicity ASAP; pt is due for repeat A1c in April, scheduled PCP visit    Hyperlipidemia: (LDL goal < 70) -Controlled -Current treatment: Atorvastatin 80 mg daily - Appropriate, Effective, Safe, Accessible -Medications previously tried: n/a  -Educated on Cholesterol goals;  Benefits of statin for ASCVD risk reduction; -Recommended to continue current medication  Patient Goals/Self-Care Activities Patient will: - take medications as prescribed - check glucose daily, document, and provide at future appointments - check blood pressure with abnormal symptoms, document, and provide at future appointments -      Patient verbalizes understanding of instructions and care plan provided today and agrees to view in [place]. Active MyChart status confirmed with patient.   Telephone follow up appointment with pharmacy team member scheduled for: 3 months  [name], PharmD, [facility] Clinical Pharmacist [place] [facility] at [facility] [phone]

## 2021-06-04 NOTE — Telephone Encounter (Signed)
°  Chronic Care Management   Outreach Note  06/04/2021 Name: Christian [name] MRN: [id number] DOB: 01/25/1941  Referred by: Tonia Ghent, MD  Patient had a phone appointment scheduled with clinical pharmacist today.  An unsuccessful telephone outreach was attempted today. The patient was referred to the pharmacist for assistance with medications, care management and care coordination.   Patient will NOT be penalized in any way for missing a CCM appointment. The no-show fee does not apply.  If possible, a message was left to return call to: [phone] or to [facility].  [name], PharmD, BCACP Clinical Pharmacist [place] [facility] at [facility] [phone]

## 2021-06-04 NOTE — Progress Notes (Signed)
Chronic Care Management  Pharmacy Note  05/06/2021 Name:  Christian [name] MRN:  [id number] DOB:  02/21/1941  Summary: CCM F/U visit -Pt started Trulicity 6.71 mg 2/45/80. He missed his dose of Trulicity this week due to being out of town. BG typically 109-140 since being on Trulicity, this week w/ missed dose 170-180.   Recommendation:  -Resume Trulicity ASAP. Due for A1c after 07/20/21, scheduled PCP visit  Follow up: -[place] will call patient 1 month for BG log -Pharmacist follow up televisit scheduled for 3 months -PCP DM F/U due 07/2021, not yet scheduled    Subjective: Christian [name] is an 81 y.o. year old male who is a primary patient of [name], [name], MD.  The CCM team was consulted for assistance with disease management and care coordination needs.    Engaged with patient by telephone for follow up visit in response to provider referral for pharmacy case management   Consent to Services:  The patient was given information about Chronic Care Management services, agreed to services, and gave verbal consent prior to initiation of services.  Please see initial visit note for detailed documentation.   Patient Care Team: Tonia Ghent, MD as PCP - General (Family Medicine) Charlton Haws, [facility] as Pharmacist (Pharmacist)  Recent office visits: 04/21/21 - Elsie Stain, MD - Pt presented for diabetes follow up. A1c increased from 9.1 to 10.5%. Foot exam normal. Rectal bleeding, refer to GI. Fungal infection, start nystatin. Follow up 3 months. 03/26/21 - CCM diabetes follow up - Pt approved for PAP (Trulicity 9.98 mg weekly and Jardiance 10 mg), Start Trulicity this week. Improve diabetes diet/exercise.  Recent consult visits: 04/28/21 - NP [place] (GI) - Pt presented for rectal bleeding. Exam showed internal and external hemorrhoids without active bleeding. Use MiraLAX nightly PRN. Apply Desitin. Update CBC. BP 80/50, recheck BP came up to 110/58.    Hospital visits: None since last CCM contact  Objective:  Lab Results  Component Value Date   CREATININE 1.10 08/30/2020   BUN 17 08/30/2020   GFR 66.20 08/08/2020   GFRNONAA >60 08/22/2020   GFRAA >60 05/17/2016   NA 143 08/30/2020   K 4.8 08/30/2020   CALCIUM 9.1 08/30/2020   CO2 24 08/30/2020   GLUCOSE 264 (H) 08/30/2020    Lab Results  Component Value Date/Time   HGBA1C 10.5 (A) 04/21/2021 10:01 AM   HGBA1C 9.1 (H) 08/08/2020 10:03 AM   HGBA1C 9.5 (A) 04/16/2020 09:27 AM   HGBA1C 9.8 (H) 01/04/2020 08:02 AM   GFR 66.20 08/08/2020 10:03 AM   GFR 49.73 (L) 05/09/2020 11:23 AM   MICROALBUR 1.6 01/04/2020 08:02 AM   MICROALBUR 0.7 03/07/2018 09:41 AM    Last diabetic Eye exam:  Lab Results  Component Value Date/Time   HMDIABEYEEXA No Retinopathy 08/30/2020 12:00 AM    Last diabetic Foot exam: 08/08/20  PCP diabetic foot ulcer  Lab Results  Component Value Date   CHOL 85 01/04/2020   HDL 27.70 (L) 01/04/2020   LDLDIRECT 19.0 01/04/2020   TRIG 391.0 (H) 01/04/2020   CHOLHDL 3 01/04/2020    Hepatic Function Latest Ref Rng & Units 08/18/2020 01/04/2020 03/01/2018  Total Protein 6.5 - 8.1 g/dL 6.7 7.3 7.3  Albumin 3.5 - 5.0 g/dL 3.3(L) 4.2 4.3  AST 15 - 41 U/L '23 26 29  ' ALT 0 - 44 U/L '18 19 23  ' Alk Phosphatase 38 - 126 U/L 55 64 74  Total Bilirubin 0.3 -  1.2 mg/dL 0.9 0.9 0.8    Lab Results  Component Value Date/Time   TSH 3.337 03/06/2016 03:45 AM    CBC Latest Ref Rng & Units 04/28/2021 08/30/2020 08/22/2020  WBC 4.0 - 10.5 K/uL 9.4 9.2 8.7  Hemoglobin 13.0 - 17.0 g/dL 13.7 12.4(L) 11.8(L)  Hematocrit 39.0 - 52.0 % 41.5 38.0(L) 36.6(L)  Platelets 150.0 - 400.0 K/uL 213.0 222 232   Clinical ASCVD: No  The ASCVD Risk score (Arnett DK, et al., 2019) failed to calculate for the following reasons:   The valid systolic blood pressure range is 90 to 200 mmHg   The valid total cholesterol range is 130 to 320 mg/dL    Depression screen [facility] 2/9 04/21/2021 04/16/2020  03/08/2018  Decreased Interest 0 0 0  Down, Depressed, Hopeless 0 0 0  PHQ - 2 Score 0 0 0  Some recent data might be hidden    Social History   Tobacco Use  Smoking Status Never  Smokeless Tobacco Never   BP Readings from Last 3 Encounters:  04/28/21 (!) 80/50  04/21/21 110/64  11/07/20 120/64   Pulse Readings from Last 3 Encounters:  04/28/21 (!) 104  04/21/21 (!) 101  11/07/20 (!) 57   Wt Readings from Last 3 Encounters:  04/28/21 214 lb 6 oz (97.2 kg)  04/21/21 210 lb (95.3 kg)  11/07/20 219 lb 8 oz (99.6 kg)   BMI Readings from Last 3 Encounters:  04/28/21 28.67 kg/m  04/21/21 26.96 kg/m  11/07/20 28.18 kg/m    Assessment/Interventions: Review of patient past medical history, allergies, medications, health status, including review of consultants reports, laboratory and other test data, was performed as part of comprehensive evaluation and provision of chronic care management services.   SDOH:  (Social Determinants of Health) assessments and interventions performed: Yes  SDOH Screenings   Alcohol Screen: Not on file  Depression (PHQ2-9): Low Risk    PHQ-2 Score: 0  Financial Resource Strain: Low Risk    Difficulty of Paying Living Expenses: Not very hard  Food Insecurity: Not on file  Housing: Not on file  Physical Activity: Not on file  Social Connections: Not on file  Stress: Not on file  Tobacco Use: Low Risk    Smoking Tobacco Use: Never   Smokeless Tobacco Use: Never   Passive Exposure: Not on file  Transportation Needs: Not on file    [place]  No Known Allergies  Medications Reviewed Today     Reviewed by Charlton Haws, [facility] (Pharmacist) on 06/04/21 at 1617  Med List Status: <None>   Medication Order Taking? Sig Documenting Provider Last Dose Status Informant  amLODipine (NORVASC) 5 MG tablet [id number] Yes Take 1 tablet (5 mg total) by mouth daily. Tonia Ghent, MD Taking Active   atorvastatin (LIPITOR) 80 MG tablet  [id number] Yes TAKE ONE TABLET BY MOUTH EVERY EVENING Tonia Ghent, MD Taking Active   empagliflozin (JARDIANCE) 10 MG TABS tablet [id number] Yes Take 1 tablet (10 mg total) by mouth daily before breakfast. Tonia Ghent, MD Taking Active   glipiZIDE (GLUCOTROL) 5 MG tablet [id number] Yes TAKE TWO TABLETS BY MOUTH EVERY MORNING and TAKE ONE TABLET BY MOUTH EVERY EVENING Tonia Ghent, MD Taking Active   glucose blood [facility] ULTRA) test strip [id number] Yes USE 1 STRIP TO CHECK GLUCOSE ONCE DAILY Tonia Ghent, MD Taking Active   hydrochlorothiazide (HYDRODIURIL) 12.5 MG tablet [id number] Yes Take 1 tablet (12.5 mg total) by mouth daily. [name],  [name], MD Taking Active   insulin detemir (LEVEMIR FLEXTOUCH) 100 UNIT/ML FlexPen [id number] Yes INJECT UP TO 70 UNITS SUBCUTANEOUSLY ONCE DAILY Tonia Ghent, MD Taking Active   Insulin Pen Needle (RELION PEN NEEDLE 31G/8MM) 31G X 8 MM MISC [id number] Yes USE TO INJECT LEVEMIR DAILY Tonia Ghent, MD Taking Active   lisinopril (ZESTRIL) 30 MG tablet [id number] Yes TAKE ONE TABLET BY MOUTH EVERY EVENING Tonia Ghent, MD Taking Active   nystatin cream (MYCOSTATIN) [id number] Yes Apply 1 application topically 2 (two) times daily. Tonia Ghent, MD Taking Active   TRULICITY 0.10 XN/2.3FT [place] [id number] Yes INJECT 0.75MG (0.5ML) UNDER THE SKIN ONCE A WEEK Tonia Ghent, MD Taking Active             Patient Active Problem List   Diagnosis Date Noted   BRBPR (bright red blood per rectum) 04/23/2021   Lumbar spondylosis 12/26/2020   Osteomyelitis of fifth toe of right foot ([place])    Diabetic foot infection ([place]) 08/18/2020   Diabetic foot ulcer ([place]) 08/11/2020   Healthcare maintenance 01/10/2020   Lower back pain 09/27/2019   Thumb pain 06/25/2019   Medicare annual wellness visit, subsequent 03/12/2018   Advance care planning 03/12/2018   Shoulder pain 02/03/2017   Edema 11/02/2016   Diabetes mellitus with microalbuminuria  ([place]) 06/10/2016   HLD (hyperlipidemia) 06/10/2016   DM type 2 with diabetic peripheral neuropathy ([place])    HTN (hypertension)    Accident caused by farm tractor 01/15/2016   Colon polyps 06/24/2013    Immunization History  Administered Date(s) Administered   Fluad Quad(high Dose 65+) 01/05/2019, 01/09/2020, 04/01/2021   Influenza,inj,Quad PF,6+ Mos 02/20/2013, 01/03/2014, 02/05/2015, 01/15/2016, 03/26/2016, 02/02/2017   Influenza-Unspecified 04/13/2010, 01/11/2018   PFIZER(Purple Top)SARS-COV-2 Vaccination 05/23/2019, 06/17/2019   Pneumococcal Conjugate-13 01/06/2012, 01/03/2014   Pneumococcal Polysaccharide-23 12/19/2012   Td 05/14/2014   Zoster Recombinat (Shingrix) 10/27/2018, 01/31/2019   Zoster, Live 12/19/2014    Conditions to be addressed/monitored:  Hypertension, Hyperlipidemia, and Diabetes  Care Plan : [place]  Updates made by Charlton Haws, [place] since 06/04/2021 12:00 AM     Problem: Hypertension, Hyperlipidemia, and Diabetes   Priority: High     Long-Range Goal: Disease Management   Start Date: 07/18/2020  Expected End Date: 06/04/2022  This Visit's Progress: On track  Recent Progress: Not on track  Priority: High  Note:   Current Barriers: Unable to achieve control of diabetes - worsening    Pharmacist Clinical Goal(s):  Patient will adhere to plan to optimize therapeutic regimen for diabetes as evidenced by report of adherence to recommended medication management changes through collaboration with PharmD and provider.   Interventions: 1:1 collaboration with Tonia Ghent, MD regarding development and update of comprehensive plan of care as evidenced by provider attestation and co-signature Inter-disciplinary care team collaboration (see longitudinal plan of care) Comprehensive medication review performed; medication list updated in electronic medical record   Hypertension (BP goal <130/80) -Controlled - per patient reported home  log -Current home readings: ~114/80, 80 bpm (uses an arm cuff for at home monitoring) -Current treatment: Amlodipine 5 mg - 1 tablet daily - Appropriate, Effective, Safe, Accessible Lisinopril 30 mg - 1 tablet daily - Appropriate, Effective, Safe, Accessible HCTZ 12.5 mg - 1 tablet daily - Appropriate, Effective, Safe, Accessible -Medications previously tried: none -Continue to monitor BP at home daily, document, and provide log at future appointments -Recommended continue current medications   Diabetes (A1c goal <7%) -  Uncontrolled, improving - A1c 10.4% Jan 3685, started Trulicity 9/92/34 and home BG improving overall, however he did forget his Trulicity pens at home this week while visiting family so missed his weekly dose and BG is worse this week -Current home glucose readings: Fasting: 109-140; this week w/o Trulicity 144-360 -Current medications: Levemir - Inject 70 units daily at bedtime (split in 2 doses) - Appropriate, Effective, Safe, Accessible Glipizide 5 mg - 2 in the morning and 1 at night - Appropriate, Effective, Safe, Accessible Jardiance 10 mg - 1 tablet daily  - Appropriate, Effective, Safe, Accessible Trulicity 1.65 mg - Inject once weekly (started 05/01/21) - Appropriate, Effective, Safe, Accessible -Medications previously tried: metformin 500 mg - severe GI upset  -Current meal patterns: high carb diet  -Advised to resume his Trulicity ASAP; pt is due for repeat A1c in April, scheduled PCP visit    Hyperlipidemia: (LDL goal < 70) -Controlled -Current treatment: Atorvastatin 80 mg daily - Appropriate, Effective, Safe, Accessible -Medications previously tried: n/a  -Educated on Cholesterol goals;  Benefits of statin for ASCVD risk reduction; -Recommended to continue current medication  Patient Goals/Self-Care Activities Patient will: - take medications as prescribed - check glucose daily, document, and provide at future appointments - check blood pressure with  abnormal symptoms, document, and provide at future appointments -    Medication Assistance: [place] approved through 80/06/34 Trulicity - [place] approved through 04/12/22  Star Rating Drugs:  Atorvastatin 80 mg - PDC 100% Glipizide 5 mg - PDC 100%             Lisinopril 30 mg - PDC 100%   Patient's preferred pharmacy is: [facility] Pt receives the following in adherence packaging:  Packs:  Amlodipine 2.5 mg - 2 tabs at breakfast Atorvastatin 80 mg - 1 evening meal  Glipizide 5 mg - 2 breakfast, 1 evening meal Lisinopril 30 mg - 1 evening meal  Prevagen Regular Strength - 1 at breakfast  Care Plan and Follow Up Patient Decision:  Patient agrees to Care Plan and Follow-up.  Follow Up Plan: Telephone follow up appointment with care management team member scheduled for: 3 months  [name], PharmD, BCACP Clinical Pharmacist [place] [facility] at [facility] [phone]

## 2021-06-05 ENCOUNTER — Telehealth: Payer: Medicare Other

## 2021-06-10 DIAGNOSIS — E785 Hyperlipidemia, unspecified: Secondary | ICD-10-CM

## 2021-06-10 DIAGNOSIS — I10 Essential (primary) hypertension: Secondary | ICD-10-CM | POA: Diagnosis not present

## 2021-06-10 DIAGNOSIS — E1142 Type 2 diabetes mellitus with diabetic polyneuropathy: Secondary | ICD-10-CM

## 2021-06-11 ENCOUNTER — Telehealth: Payer: Self-pay

## 2021-06-11 NOTE — Progress Notes (Signed)
? ? ?Chronic Care Management ?Pharmacy Assistant  ? ?Name: Christian [name]  MRN: [id number] DOB: [birth year] ? ?Reason for Encounter: CCM (Medication Adherence and Delivery Coordination) ?  ?Recent office visits:  ?None since last CCM contact ? ?Recent consult visits:  ?None since last CCM contact ? ?Hospital visits:  ?None in previous 6 months ? ?Medications: ?Outpatient Encounter Medications as of 06/11/2021  ?Medication Sig  ? amLODipine (NORVASC) 5 MG tablet Take 1 tablet (5 mg total) by mouth daily.  ? atorvastatin (LIPITOR) 80 MG tablet TAKE ONE TABLET BY MOUTH EVERY EVENING  ? empagliflozin (JARDIANCE) 10 MG TABS tablet Take 1 tablet (10 mg total) by mouth daily before breakfast.  ? glipiZIDE (GLUCOTROL) 5 MG tablet TAKE TWO TABLETS BY MOUTH EVERY MORNING and TAKE ONE TABLET BY MOUTH EVERY EVENING  ? glucose blood (ONETOUCH ULTRA) test strip USE 1 STRIP TO CHECK GLUCOSE ONCE DAILY  ? hydrochlorothiazide (HYDRODIURIL) 12.5 MG tablet Take 1 tablet (12.5 mg total) by mouth daily.  ? insulin detemir (LEVEMIR FLEXTOUCH) 100 UNIT/ML FlexPen INJECT UP TO 70 UNITS SUBCUTANEOUSLY ONCE DAILY  ? Insulin Pen Needle (RELION PEN NEEDLE 31G/8MM) 31G X 8 MM MISC USE TO INJECT LEVEMIR DAILY  ? lisinopril (ZESTRIL) 30 MG tablet TAKE ONE TABLET BY MOUTH EVERY EVENING  ? nystatin cream (MYCOSTATIN) Apply 1 application topically 2 (two) times daily.  ? TRULICITY 9.38 HW/2.9HB SOPN INJECT 0.75MG  (0.5ML) UNDER THE SKIN ONCE A WEEK  ? ?No facility-administered encounter medications on file as of 06/11/2021.  ? ?BP Readings from Last 3 Encounters:  ?04/28/21 (!) 80/50  ?04/21/21 110/64  ?11/07/20 120/64  ?  ?Lab Results  ?Component Value Date  ? HGBA1C 10.5 (A) 04/21/2021  ?  ? ?No OVs, Consults, or hospital visits since last care coordination call / Pharmacist visit. ?No medication changes indicated ? ?Last adherence delivery date: 05/07/2021  - patient was behind on his packs so did not need a delivery on 06/05/2021 ? ?Patient is due for  next adherence delivery on: 06/23/2021 ? ?Spoke with patient on 06/11/2021 reviewed medications and coordinated delivery. ? ?This delivery to include: Adherence Packaging   30 days  ?Packs: ?Atorvastatin 80mg  take 1 tablet evening meal ?Glipizide 5 mg - 2 tablets breakfast,1 tablet evening meal  ?Lisinopril 30mg  take 1 tablet evening meal ?Prevagen chew 30 take 1 tablet breakfast ?Amlodipine 5mg  take 1 tablet breakfast ?Hydrochlorothiazide 12.5mg  take 1 tablet breakfast ?  ?VIAL medications: ?Trueplus Pen Needles ?  ?Can not be filled until 07/05/2021 ?One Touch Test Strips ?  ?Patient declined the following medications this month: ?Levemir - Inject 70 units daily (patient prefers to get this from [facility]) ?Jardiance 10 mg - 1 tablet morning - gets from manufacturer ?Trulicity 7.16 mg  Inject once weekly  - gets from manufacturer ? ?Any concerns about your medications? No ? ?How often do you forget or accidentally miss a dose? Rarely ? ?Do you use a pillbox? No ? ?Is patient in packaging Yes ? If yes ? What is the date on your next pill pack? 05/26/2021 Evening Meal ? Any concerns or issues with your packaging? No ?Patient's packaging box has a start date of 05/10/2021 and an end date of 06/08/2021.  ?Patient has 13 more days of packs before he runs outs. Patient will need delivery around March 13.  ? ?No refill request needed. ? ?Confirmed delivery date of 06/23/2021, advised patient that pharmacy will contact them the morning of delivery. ? ?Annual wellness visit in  last year? No ?Most Recent BP reading: 80/50 on 04/28/2021 ? ?If Diabetic: ?Most recent A1C reading: 10.5 on 04/21/2021 ?Last eye exam / retinopathy screening: Up to date ?Last diabetic foot exam: Up to date ? ?[name], CPP notified ? ?[name], RMA ?Clinical Pharmacy Assistant ?[phone] ? ?Time Spent: 30 Minutes ? ? ? ?

## 2021-06-13 ENCOUNTER — Ambulatory Visit: Payer: Medicare Other | Admitting: Podiatry

## 2021-06-13 ENCOUNTER — Ambulatory Visit (INDEPENDENT_AMBULATORY_CARE_PROVIDER_SITE_OTHER): Payer: Medicare Other

## 2021-06-13 ENCOUNTER — Other Ambulatory Visit: Payer: Self-pay

## 2021-06-13 DIAGNOSIS — E1142 Type 2 diabetes mellitus with diabetic polyneuropathy: Secondary | ICD-10-CM

## 2021-06-13 DIAGNOSIS — L97521 Non-pressure chronic ulcer of other part of left foot limited to breakdown of skin: Secondary | ICD-10-CM

## 2021-06-13 DIAGNOSIS — L97512 Non-pressure chronic ulcer of other part of right foot with fat layer exposed: Secondary | ICD-10-CM | POA: Diagnosis not present

## 2021-06-13 DIAGNOSIS — L97511 Non-pressure chronic ulcer of other part of right foot limited to breakdown of skin: Secondary | ICD-10-CM

## 2021-06-13 DIAGNOSIS — E08621 Diabetes mellitus due to underlying condition with foot ulcer: Secondary | ICD-10-CM | POA: Diagnosis not present

## 2021-06-13 DIAGNOSIS — E11621 Type 2 diabetes mellitus with foot ulcer: Secondary | ICD-10-CM

## 2021-06-13 NOTE — Progress Notes (Signed)
SITUATION ?Reason for Visit: Fitting of Diabetic [place] ?Patient / Caregiver Report:  Patient is satisfied with fit and function of shoes and insoles. ? ?OBJECTIVE DATA: ?Patient History / Diagnosis:   ?  ICD-10-CM   ?1. DM type 2 with diabetic peripheral neuropathy (HCC)  E11.42   ?  ?2. Diabetic ulcer of other part of right foot associated with diabetes mellitus due to underlying condition, limited to breakdown of skin ([place])  F64.332   ? L97.511   ?  ?3. Diabetic ulcer of toe of left foot associated with type 2 diabetes mellitus, limited to breakdown of skin ([place])  E11.621   ? R51.884   ?  ? ? ?Change in Status:   None ? ?ACTIONS PERFORMED: ?In-Person Delivery, patient was fit with: ?- 1x pair [id number] PDAC approved prefabricated Diabetic Shoes: Apex X821M 12.5W ?- 3x pair [id number] PDAC approved vacuum formed custom diabetic insoles; [place]: [id number] ? ?Shoes and insoles were verified for structural integrity and safety. Patient wore shoes and insoles in office. Skin was inspected and free of areas of concern after wearing shoes and inserts. Shoes and inserts fit properly. Patient / Caregiver provided with ferbal instruction and demonstration regarding donning, doffing, wear, care, proper fit, function, purpose, cleaning, and use of shoes and insoles ' and in all related precautions and risks and benefits regarding shoes and insoles. Patient / Caregiver was instructed to wear properly fitting socks with shoes at all times. Patient was also provided with verbal instruction regarding how to report any failures or malfunctions of shoes or inserts, and necessary follow up care. Patient / Caregiver was also instructed to contact physician regarding change in status that may affect function of shoes and inserts.  ? ?Patient / Caregiver verbalized undersatnding of instruction provided. Patient / Caregiver demonstrated independence with proper donning and doffing of shoes and inserts. ? ?PLAN ?Patient to follow up  as needed. Plan of care was discussed with and agreed upon by patient and/or caregiver. All questions were answered and concerns addressed. ? ?

## 2021-06-18 NOTE — Progress Notes (Signed)
?Subjective:  ?Patient ID: Christian [name], male    DOB: [birth year],  MRN: [id number] ? ?Chief Complaint  ?Patient presents with  ? Diabetes  ?  Wound check   ? ? ?81 y.o. male presents for wound care.  Patient presents with a new ulceration left submetatarsal 5.  She he is known to Dr. March Rummage who had done preulcerative callus wart.  However now he states that there is some redness associate with it the wound has gotten worse.  He wanted get it evaluated.  No signs of infection noted no malodor present.  No temperature noted. ? ? ?Review of Systems: Negative except as noted in the HPI. Denies N/V/F/Ch. ? ?Past Medical History:  ?Diagnosis Date  ? Arthritis   ? Blood transfusion without reported diagnosis   ? ? in 2017 after crushing injury   ? Cataract   ? right eye removed   ? Crushing injury of pelvic region   ? Diabetes mellitus without complication ([place])   ? Hyperlipidemia   ? Hypertension   ? ? ?Current Outpatient Medications:  ?  amLODipine (NORVASC) 5 MG tablet, Take 1 tablet (5 mg total) by mouth daily., Disp: 90 tablet, Rfl: 2 ?  atorvastatin (LIPITOR) 80 MG tablet, TAKE ONE TABLET BY MOUTH EVERY EVENING, Disp: 90 tablet, Rfl: 2 ?  empagliflozin (JARDIANCE) 10 MG TABS tablet, Take 1 tablet (10 mg total) by mouth daily before breakfast., Disp: 90 tablet, Rfl: 3 ?  glipiZIDE (GLUCOTROL) 5 MG tablet, TAKE TWO TABLETS BY MOUTH EVERY MORNING and TAKE ONE TABLET BY MOUTH EVERY EVENING, Disp: 270 tablet, Rfl: 2 ?  glucose blood (ONETOUCH ULTRA) test strip, USE 1 STRIP TO CHECK GLUCOSE ONCE DAILY, Disp: 100 each, Rfl: 3 ?  hydrochlorothiazide (HYDRODIURIL) 12.5 MG tablet, Take 1 tablet (12.5 mg total) by mouth daily., Disp: 90 tablet, Rfl: 3 ?  insulin detemir (LEVEMIR FLEXTOUCH) 100 UNIT/ML FlexPen, INJECT UP TO 70 UNITS SUBCUTANEOUSLY ONCE DAILY, Disp: 45 mL, Rfl: 1 ?  Insulin Pen Needle (RELION PEN NEEDLE 31G/8MM) 31G X 8 MM MISC, USE TO INJECT LEVEMIR DAILY, Disp: 100 each, Rfl: 3 ?  lisinopril (ZESTRIL) 30 MG  tablet, TAKE ONE TABLET BY MOUTH EVERY EVENING, Disp: 30 tablet, Rfl: 2 ?  nystatin cream (MYCOSTATIN), Apply 1 application topically 2 (two) times daily., Disp: 30 g, Rfl: 1 ?  TRULICITY 5.88 FO/2.7XA SOPN, INJECT 0.'75MG'$  (0.5ML) UNDER THE SKIN ONCE A WEEK, Disp: 2 mL, Rfl: 4 ? ?Social History  ? ?Tobacco Use  ?Smoking Status Never  ?Smokeless Tobacco Never  ? ? ?No Known Allergies ?Objective:  ?There were no vitals filed for this visit. ?There is no height or weight on file to calculate BMI. ?Constitutional Well developed. ?Well nourished.  ?Vascular Dorsalis pedis pulses palpable bilaterally. ?Posterior tibial pulses palpable bilaterally. ?Capillary refill normal to all digits.  ?No cyanosis or clubbing noted. ?Pedal hair growth normal.  ?Neurologic Normal speech. ?Oriented to person, place, and time. ?Protective sensation absent  ?Dermatologic Wound Location: Left submetatarsal 5 ulceration with fat layer exposed.  No malodor present.  No erythema noted. ?Wound Base: Mixed Granular/Fibrotic ?Peri-wound: Calloused ?Exudate: Scant/small amount Serosanguinous exudate ?Wound Measurements: ?-See below  ?Orthopedic: No pain to palpation either foot.  ? ?Radiographs: None ?Assessment:  ? ?1. Diabetic ulcer of other part of right foot associated with diabetes mellitus due to underlying condition, with fat layer exposed ([place])   ?2. DM type 2 with diabetic peripheral neuropathy ([place])   ? ?Plan:  ?Patient  was evaluated and treated and all questions answered. ? ?Ulcer left submetatarsal 5 ulceration with fat layer exposed ?-Debridement as below. ?-Dressed with Betadine wet-to-dry, DSD. ?-Continue off-loading with surgical shoe. ?-Patient is a diabetic with last A1c of 10.5.  I encouraged glucose control. ? ?Procedure: Excisional Debridement of Wound ?Tool: Sharp chisel blade/tissue nipper ?Rationale: Removal of non-viable soft tissue from the wound to promote healing.  ?Anesthesia: none ?Pre-Debridement Wound Measurements:  0.2 cm x 0.2 cm x 0.3 cm  ?Post-Debridement Wound Measurements: 0.4 cm x 0.3 cm x 0.3 cm  ?Type of Debridement: Sharp Excisional ?Tissue Removed: Non-viable soft tissue ?Blood loss: Minimal (<50cc) ?Depth of Debridement: subcutaneous tissue. ?Technique: Sharp excisional debridement to bleeding, viable wound base.  ?Wound Progress: This is my initial evaluation of continue monitor the progression of the wound. ?Site healing conversation 7 ?Dressing: Dry, sterile, compression dressing. ?Disposition: Patient tolerated procedure well. Patient to return in 1 week for follow-up. ? ?No follow-ups on file. ? ?  ?

## 2021-06-23 NOTE — Progress Notes (Unsigned)
Subjective:   Christian [name] is a 81 y.o. male who presents for Medicare Annual/Subsequent preventive examination.  I connected with Christian [name] today by telephone and verified that I am speaking with the correct person using two identifiers. Location patient: home Location provider: work Persons participating in the virtual visit: patient, Marine scientist.    I discussed the limitations, risks, security and privacy concerns of performing an evaluation and management service by telephone and the availability of in person appointments. I also discussed with the patient that there may be a patient responsible charge related to this service. The patient expressed understanding and verbally consented to this telephonic visit.    Interactive audio and video telecommunications were attempted between this provider and patient, however failed, due to patient having technical difficulties OR patient did not have access to video capability.  We continued and completed visit with audio only.  Some vital signs may be absent or patient reported.   Time Spent with patient on telephone encounter: *** minutes  Review of Systems           Objective:    There were no vitals filed for this visit. There is no height or weight on file to calculate BMI.  Advanced Directives 08/18/2020 06/03/2016 05/17/2016 03/05/2016 01/17/2016 01/11/2016 01/10/2016  Does Patient Have a Medical Advance Directive? No No No No No No No  Would patient like information on creating a medical advance directive? No - Patient declined - No - Patient declined Yes (Inpatient - patient requests chaplain consult to create a medical advance directive) No - patient declined information No - patient declined information No - patient declined information    Current Medications (verified) Outpatient Encounter Medications as of 06/24/2021  Medication Sig   amLODipine (NORVASC) 5 MG tablet Take 1 tablet (5 mg total) by mouth daily.   atorvastatin  (LIPITOR) 80 MG tablet TAKE ONE TABLET BY MOUTH EVERY EVENING   empagliflozin (JARDIANCE) 10 MG TABS tablet Take 1 tablet (10 mg total) by mouth daily before breakfast.   glipiZIDE (GLUCOTROL) 5 MG tablet TAKE TWO TABLETS BY MOUTH EVERY MORNING and TAKE ONE TABLET BY MOUTH EVERY EVENING   glucose blood (ONETOUCH ULTRA) test strip USE 1 STRIP TO CHECK GLUCOSE ONCE DAILY   hydrochlorothiazide (HYDRODIURIL) 12.5 MG tablet Take 1 tablet (12.5 mg total) by mouth daily.   insulin detemir (LEVEMIR FLEXTOUCH) 100 UNIT/ML FlexPen INJECT UP TO 70 UNITS SUBCUTANEOUSLY ONCE DAILY   Insulin Pen Needle (RELION PEN NEEDLE 31G/8MM) 31G X 8 MM MISC USE TO INJECT LEVEMIR DAILY   lisinopril (ZESTRIL) 30 MG tablet TAKE ONE TABLET BY MOUTH EVERY EVENING   nystatin cream (MYCOSTATIN) Apply 1 application topically 2 (two) times daily.   TRULICITY 2.54 YH/0.6CB SOPN INJECT 0.'75MG'$  (0.5ML) UNDER THE SKIN ONCE A WEEK   No facility-administered encounter medications on file as of 06/24/2021.    Allergies (verified) Patient has no known allergies.   History: Past Medical History:  Diagnosis Date   Arthritis    Blood transfusion without reported diagnosis    ? in 2017 after crushing injury    Cataract    right eye removed    Crushing injury of pelvic region    Diabetes mellitus without complication ([place])    Hyperlipidemia    Hypertension    Past Surgical History:  Procedure Laterality Date   ACHILLES TENDON SURGERY Right    AMPUTATION Right 08/21/2020   Procedure: RIGHT FOOT 5TH RAY AMPUTATION;  Surgeon: Newt Minion,  MD;  Location: [facility];  Service: Orthopedics;  Laterality: Right;   APPLICATION OF A-CELL OF BACK N/A 02/10/2016   Procedure: APPLICATION OF A-CELL OF sacral wound;  Surgeon: Loel Lofty Dillingham, DO;  Location: [facility];  Service: Plastics;  Laterality: N/A;   APPLICATION OF WOUND VAC N/A 02/10/2016   Procedure: APPLICATION OF WOUND VAC possible;  Surgeon: Loel Lofty Dillingham, DO;  Location: [facility];   Service: Plastics;  Laterality: N/A;   COLONOSCOPY     I & D EXTREMITY N/A 01/30/2016   Procedure: IRRIGATION AND DEBRIDEMENT OF THE SACRUM;  Surgeon: Loel Lofty Dillingham, DO;  Location: [facility];  Service: Plastics;  Laterality: N/A;   INCISION AND DRAINAGE OF WOUND N/A 02/10/2016   Procedure: IRRIGATION AND DEBRIDEMENT sacral WOUND;  Surgeon: Loel Lofty Dillingham, DO;  Location: [facility];  Service: Plastics;  Laterality: N/A;   IR GENERIC HISTORICAL  02/11/2016   IR CATHETER TUBE CHANGE 02/11/2016 Aletta Edouard, MD [facility]   IR GENERIC HISTORICAL  03/16/2016   IR CATHETER TUBE CHANGE 03/16/2016 Sandi Mariscal, MD [facility]   IR GENERIC HISTORICAL  05/11/2016   IR CATHETER TUBE CHANGE 05/11/2016 Aletta Edouard, MD [facility]   POLYPECTOMY     SACRO-ILIAC PINNING N/A 01/16/2016   Procedure: Jenetta Downer;  Surgeon: Altamese Stedman, MD;  Location: [facility];  Service: Orthopedics;  Laterality: N/A;   SUPRAPUBIC CATHETER INSERTION     s/p removal 2018   TONSILLECTOMY     ULNAR NERVE TRANSPOSITION Right    Family History  Problem Relation Age of Onset   Lymphoma Mother    CVA Father    Dementia Father    Diabetes Neg Hx    Prostate cancer Neg Hx    Colon polyps Neg Hx    Colon cancer Neg Hx    Rectal cancer Neg Hx    Stomach cancer Neg Hx    Esophageal cancer Neg Hx    Social History   Socioeconomic History   Marital status: Married    Spouse name: Not on file   Number of children: Not on file   Years of education: Not on file   Highest education level: Not on file  Occupational History   Not on file  Tobacco Use   Smoking status: Never   Smokeless tobacco: Never  Substance and Sexual Activity   Alcohol use: No   Drug use: No   Sexual activity: Not Currently  Other Topics Concern   Not on file  Social History Narrative   Married 2015   Retired, previous mission work   Engineer, materials   Social Determinants of Radio broadcast assistant Strain: Low  Risk    Difficulty of Paying Living Expenses: Not very hard  Food Insecurity: Not on file  Transportation Needs: Not on file  Physical Activity: Not on file  Stress: Not on file  Social Connections: Not on file    Tobacco Counseling Counseling given: Not Answered   Clinical Intake:                 Diabetes:  Is the patient diabetic?  Yes  If diabetic, was a CBG obtained today?  No  Did the patient bring in their glucometer from home?  No  How often do you monitor your CBG's? ***.   Financial Strains and Diabetes Management:  Are you having any financial strains with the device, your supplies or your medication? {YES/NO:21197}.  Does the patient want to be  seen by Chronic Care Management for management of their diabetes?  {YES/NO:21197} Would the patient like to be referred to a Nutritionist or for Diabetic Management?  {YES/NO:21197}  Diabetic Exams:  Diabetic Eye Exam: Completed 08/30/20.   Diabetic Foot Exam: Completed 04/21/21.           Activities of Daily Living In your present state of health, do you have any difficulty performing the following activities: 08/22/2020 08/18/2020  Hearing? - Y  Comment - hearing aids  Vision? - N  Difficulty concentrating or making decisions? - Y  Walking or climbing stairs? - Y  Dressing or bathing? - N  Doing errands, shopping? N N  Some recent data might be hidden    Patient Care Team: Tonia Ghent, MD as PCP - General (Family Medicine) Charlton Haws, [facility] as Pharmacist (Pharmacist)  Indicate any recent Medical Services you may have received from other than [facility] providers in the past year (date may be approximate).     Assessment:   This is a routine wellness examination for [place].  Hearing/Vision screen No results found.  Dietary issues and exercise activities discussed:     Goals Addressed   None    Depression Screen PHQ 2/9 Scores 04/21/2021 04/16/2020 03/08/2018 06/03/2016  PHQ - 2 Score 0  0 0 0    Fall Risk Fall Risk  04/21/2021 04/16/2020 03/08/2019 03/08/2018 02/25/2018  Falls in the past year? 0 0 0 0 0  Comment - - Emmi Telephone Survey: data to providers prior to load - Emmi Telephone Survey: data to providers prior to load  Number falls in past yr: 0 0 - - -  Injury with Fall? 0 0 - - -  Risk for fall due to : History of fall(s) - - - -  Follow up Falls evaluation completed Falls evaluation completed - - -    FALL RISK PREVENTION PERTAINING TO THE HOME:  Any stairs in or around the home? {YES/NO:21197} If so, are there any without handrails? {YES/NO:21197} Home free of loose throw rugs in walkways, pet beds, electrical cords, etc? {YES/NO:21197} Adequate lighting in your home to reduce risk of falls? {YES/NO:21197}  ASSISTIVE DEVICES UTILIZED TO PREVENT FALLS:  Life alert? {YES/NO:21197} Use of a cane, walker or w/c? {YES/NO:21197} Grab bars in the bathroom? {YES/NO:21197} Shower chair or bench in shower? {YES/NO:21197} Elevated toilet seat or a handicapped toilet? {YES/NO:21197}  TIMED UP AND GO:  Was the test performed? No .  Length of time to ambulate 10 feet: *** sec.   {Appearance of Gait:2101803}  Cognitive Function: MMSE - Mini Mental State Exam 06/03/2016  Orientation to time 5  Orientation to Place 5  Registration 3  Attention/ Calculation 0  Recall 3  Language- name 2 objects 0  Language- repeat 1  Language- follow 3 step command 3  Language- read & follow direction 0  Write a sentence 0  Copy design 0  Total score 20        Immunizations Immunization History  Administered Date(s) Administered   Fluad Quad(high Dose 65+) 01/05/2019, 01/09/2020, 04/01/2021   Influenza,inj,Quad PF,6+ Mos 02/20/2013, 01/03/2014, 02/05/2015, 01/15/2016, 03/26/2016, 02/02/2017   Influenza-Unspecified 04/13/2010, 01/11/2018   PFIZER(Purple Top)SARS-COV-2 Vaccination 05/23/2019, 06/17/2019   Pneumococcal Conjugate-13 01/06/2012, 01/03/2014   Pneumococcal  Polysaccharide-23 12/19/2012   Td 05/14/2014   Zoster Recombinat (Shingrix) 10/27/2018, 01/31/2019   Zoster, Live 12/19/2014    TDAP status: Up to date  Flu Vaccine status: Up to date  Pneumococcal vaccine status: Up  to date  {Covid-19 vaccine status:2101808}  Qualifies for Shingles Vaccine? Yes   Zostavax completed Yes   Shingrix Completed?: Yes  Screening Tests Health Maintenance  Topic Date Due   Hepatitis C Screening  Never done   COVID-19 Vaccine (3 - Booster for Pfizer series) 08/12/2019   OPHTHALMOLOGY EXAM  08/30/2021   HEMOGLOBIN A1C  10/19/2021   FOOT EXAM  04/21/2022   TETANUS/TDAP  05/14/2024   Pneumonia Vaccine 1+ Years old  Completed   INFLUENZA VACCINE  Completed   Zoster Vaccines- Shingrix  Completed   HPV VACCINES  Aged Out    Health Maintenance  Health Maintenance Due  Topic Date Due   Hepatitis C Screening  Never done   COVID-19 Vaccine (3 - Booster for [place] series) 08/12/2019    Colorectal cancer screening: No longer required.   Lung Cancer Screening: (Low Dose CT Chest recommended if Age 14-80 years, 30 pack-year currently smoking OR have quit w/in 15years.) does not qualify.     Additional Screening:  Hepatitis C Screening: does not qualify  Vision Screening: Recommended annual ophthalmology exams for early detection of glaucoma and other disorders of the eye. Is the patient up to date with their annual eye exam?  {YES/NO:21197} Who is the provider or what is the name of the office in which the patient attends annual eye exams? *** If pt is not established with a provider, would they like to be referred to a provider to establish care? {YES/NO:21197}.   Dental Screening: Recommended annual dental exams for proper oral hygiene  Community Resource Referral / Chronic Care Management: CRR required this visit?  {YES/NO:21197}  CCM required this visit?  {YES/NO:21197}     Plan:     I have personally reviewed and noted the  following in the patients chart:   Medical and social history Use of alcohol, tobacco or illicit drugs  Current medications and supplements including opioid prescriptions. {Opioid Prescriptions:[phone]} Functional ability and status Nutritional status Physical activity Advanced directives List of other physicians Hospitalizations, surgeries, and ER visits in previous 12 months Vitals Screenings to include cognitive, depression, and falls Referrals and appointments  In addition, I have reviewed and discussed with patient certain preventive protocols, quality metrics, and best practice recommendations. A written personalized care plan for preventive services as well as general preventive health recommendations were provided to patient.   Due to this being a telephonic visit, the after visit summary with patients personalized plan was offered to patient via mail or my-chart. ***Patient declined at this time./ Patient would like to access on my-chart/ per request, patient was mailed a copy of AVS./ Patient preferred to pick up at office at next visit.   Loma Messing, LPN   [id number]   Nurse Health Advisor  Nurse Notes: none

## 2021-06-24 ENCOUNTER — Telehealth: Payer: Self-pay

## 2021-06-24 ENCOUNTER — Ambulatory Visit (INDEPENDENT_AMBULATORY_CARE_PROVIDER_SITE_OTHER): Payer: Medicare Other

## 2021-06-24 VITALS — Ht 72.0 in | Wt 214.0 lb

## 2021-06-24 DIAGNOSIS — Z0000 Encounter for general adult medical examination without abnormal findings: Secondary | ICD-10-CM | POA: Diagnosis not present

## 2021-06-24 NOTE — Telephone Encounter (Signed)
[facility] Night - Client ?Nonclinical Telephone Record  ?AccessNurse? ?Client [place] Night - Client ?Client Site [place] ?Provider Renford Dills - MD ?Contact Type Call ?Who Is Calling Patient / Member / Family / Caregiver ?Caller Name Christian [name] ?Caller Phone Number [phone] ?Call Type Message Only Information Provided ?Reason for Call Returning a Call from the Office ?Initial Comment Caller states he missed a call from the office. He is not sure if the appt is for his self of his wife ?Additional Comment Office hours provided ?Disp. Time Disposition Final User ?06/23/2021 5:09:11 PM General Information Provided Yes [name] ?Call Closed By: [name] ?Transaction Date/Time: 06/23/2021 5:05:45 PM (ET ?

## 2021-06-24 NOTE — Patient Instructions (Signed)
[name] , ?Thank you for taking time to complete your Medicare Wellness Visit. I appreciate your ongoing commitment to your health goals. Please review the following plan we discussed and let me know if I can assist you in the future.  ? ?Screening recommendations/referrals: ?Colonoscopy: no longer required  ?ophthalmology/optometry visit: up to date ?Recommended yearly dental visit for hygiene and checkup ? ?Vaccinations: ?Influenza vaccine: up to date  ?Pneumococcal vaccine: up to date  ?Tdap vaccine: up to date, due 05/14/24 ?Shingles vaccine: up to date    ?Covid-19: newest booster available at your pharmacy  ? ?Advanced directives: Please bring a copy of Living Will and/or [place] for your chart, when available  ? ? ?Conditions/risks identified: see problem list  ? ?Next appointment: Follow up in one year for your annual wellness visit.  ? ?Preventive Care 80 Years and Older, Male ?Preventive care refers to lifestyle choices and visits with your health care provider that can promote health and wellness. ?What does preventive care include? ?A yearly physical exam. This is also called an annual well check. ?Dental exams once or twice a year. ?Routine eye exams. Ask your health care provider how often you should have your eyes checked. ?Personal lifestyle choices, including: ?Daily care of your teeth and gums. ?Regular physical activity. ?Eating a healthy diet. ?Avoiding tobacco and drug use. ?Limiting alcohol use. ?Practicing safe sex. ?Taking low doses of aspirin every day. ?Taking vitamin and mineral supplements as recommended by your health care provider. ?What happens during an annual well check? ?The services and screenings done by your health care provider during your annual well check will depend on your age, overall health, lifestyle risk factors, and family history of disease. ?Counseling  ?Your health care provider may ask you questions about your: ?Alcohol use. ?Tobacco use. ?Drug  use. ?Emotional well-being. ?Home and relationship well-being. ?Sexual activity. ?Eating habits. ?History of falls. ?Memory and ability to understand (cognition). ?Work and work Statistician. ?Screening  ?You may have the following tests or measurements: ?Height, weight, and BMI. ?Blood pressure. ?Lipid and cholesterol levels. These may be checked every 5 years, or more frequently if you are over 70 years old. ?Skin check. ?Lung cancer screening. You may have this screening every year starting at age 75 if you have a 30-pack-year history of smoking and currently smoke or have quit within the past 15 years. ?Fecal occult blood test (FOBT) of the stool. You may have this test every year starting at age 12. ?Flexible sigmoidoscopy or colonoscopy. You may have a sigmoidoscopy every 5 years or a colonoscopy every 10 years starting at age 53. ?Prostate cancer screening. Recommendations will vary depending on your family history and other risks. ?Hepatitis C blood test. ?Hepatitis B blood test. ?Sexually transmitted disease (STD) testing. ?Diabetes screening. This is done by checking your blood sugar (glucose) after you have not eaten for a while (fasting). You may have this done every 1-3 years. ?Abdominal aortic aneurysm (AAA) screening. You may need this if you are a current or former smoker. ?Osteoporosis. You may be screened starting at age 99 if you are at high risk. ?Talk with your health care provider about your test results, treatment options, and if necessary, the need for more tests. ?Vaccines  ?Your health care provider may recommend certain vaccines, such as: ?Influenza vaccine. This is recommended every year. ?Tetanus, diphtheria, and acellular pertussis (Tdap, Td) vaccine. You may need a Td booster every 10 years. ?Zoster vaccine. You may need this  after age 51. ?Pneumococcal 13-valent conjugate (PCV13) vaccine. One dose is recommended after age 11. ?Pneumococcal polysaccharide (PPSV23) vaccine. One dose is  recommended after age 70. ?Talk to your health care provider about which screenings and vaccines you need and how often you need them. ?This information is not intended to replace advice given to you by your health care provider. Make sure you discuss any questions you have with your health care provider. ?Document Released: 04/26/2015 Document Revised: 12/18/2015 Document Reviewed: 01/29/2015 ?Elsevier Interactive Patient Education ? 2017 [place]. ? ?Fall Prevention in the Home ?Falls can cause injuries. They can happen to people of all ages. There are many things you can do to make your home safe and to help prevent falls. ?What can I do on the outside of my home? ?Regularly fix the edges of walkways and driveways and fix any cracks. ?Remove anything that might make you trip as you walk through a door, such as a raised step or threshold. ?Trim any bushes or trees on the path to your home. ?Use bright outdoor lighting. ?Clear any walking paths of anything that might make someone trip, such as rocks or tools. ?Regularly check to see if handrails are loose or broken. Make sure that both sides of any steps have handrails. ?Any raised decks and porches should have guardrails on the edges. ?Have any leaves, snow, or ice cleared regularly. ?Use sand or salt on walking paths during winter. ?Clean up any spills in your garage right away. This includes oil or grease spills. ?What can I do in the bathroom? ?Use night lights. ?Install grab bars by the toilet and in the tub and shower. Do not use towel bars as grab bars. ?Use non-skid mats or decals in the tub or shower. ?If you need to sit down in the shower, use a plastic, non-slip stool. ?Keep the floor dry. Clean up any water that spills on the floor as soon as it happens. ?Remove soap buildup in the tub or shower regularly. ?Attach bath mats securely with double-sided non-slip rug tape. ?Do not have throw rugs and other things on the floor that can make you  trip. ?What can I do in the bedroom? ?Use night lights. ?Make sure that you have a light by your bed that is easy to reach. ?Do not use any sheets or blankets that are too big for your bed. They should not hang down onto the floor. ?Have a firm chair that has side arms. You can use this for support while you get dressed. ?Do not have throw rugs and other things on the floor that can make you trip. ?What can I do in the kitchen? ?Clean up any spills right away. ?Avoid walking on wet floors. ?Keep items that you use a lot in easy-to-reach places. ?If you need to reach something above you, use a strong step stool that has a grab bar. ?Keep electrical cords out of the way. ?Do not use floor polish or wax that makes floors slippery. If you must use wax, use non-skid floor wax. ?Do not have throw rugs and other things on the floor that can make you trip. ?What can I do with my stairs? ?Do not leave any items on the stairs. ?Make sure that there are handrails on both sides of the stairs and use them. Fix handrails that are broken or loose. Make sure that handrails are as long as the stairways. ?Check any carpeting to make sure that it is firmly attached to the  stairs. Fix any carpet that is loose or worn. ?Avoid having throw rugs at the top or bottom of the stairs. If you do have throw rugs, attach them to the floor with carpet tape. ?Make sure that you have a light switch at the top of the stairs and the bottom of the stairs. If you do not have them, ask someone to add them for you. ?What else can I do to help prevent falls? ?Wear shoes that: ?Do not have high heels. ?Have rubber bottoms. ?Are comfortable and fit you well. ?Are closed at the toe. Do not wear sandals. ?If you use a stepladder: ?Make sure that it is fully opened. Do not climb a closed stepladder. ?Make sure that both sides of the stepladder are locked into place. ?Ask someone to hold it for you, if possible. ?Clearly mark and make sure that you can  see: ?Any grab bars or handrails. ?First and last steps. ?Where the edge of each step is. ?Use tools that help you move around (mobility aids) if they are needed. These include: ?Canes. ?Walkers. ?Scooters. ?Crutches. ?

## 2021-06-24 NOTE — Telephone Encounter (Signed)
I did not contact patient; could have been from health nurse reminder call. ?

## 2021-07-02 ENCOUNTER — Telehealth: Payer: Self-pay

## 2021-07-02 NOTE — Progress Notes (Signed)
? ? ?Chronic Care Management ?Pharmacy Assistant  ? ?Name: Christian [name]  MRN: [id number] DOB: Jan 14, 1941 ? ?Reason for Encounter: CCM (Diabetes Disease State) ?  ?Recent office visits:  ?06/24/2021 Orrin Brigham, LPN [facility] ? ?Recent consult visits:  ?06/13/2021 Guadlupe Spanish (Orthotics): Diabetic Shoes & Insoles ?06/13/2021 Boneta Lucks, DPM (Podiatry): Diabetic ulcer right foot Procedure: Excisional Debridement of Wound ? ?Hospital visits:  ?None in previous 6 months ? ?Medications: ?Outpatient Encounter Medications as of 07/02/2021  ?Medication Sig  ? amLODipine (NORVASC) 5 MG tablet Take 1 tablet (5 mg total) by mouth daily.  ? atorvastatin (LIPITOR) 80 MG tablet TAKE ONE TABLET BY MOUTH EVERY EVENING  ? empagliflozin (JARDIANCE) 10 MG TABS tablet Take 1 tablet (10 mg total) by mouth daily before breakfast.  ? glipiZIDE (GLUCOTROL) 5 MG tablet TAKE TWO TABLETS BY MOUTH EVERY MORNING and TAKE ONE TABLET BY MOUTH EVERY EVENING  ? glucose blood (ONETOUCH ULTRA) test strip USE 1 STRIP TO CHECK GLUCOSE ONCE DAILY  ? hydrochlorothiazide (HYDRODIURIL) 12.5 MG tablet Take 1 tablet (12.5 mg total) by mouth daily.  ? insulin detemir (LEVEMIR FLEXTOUCH) 100 UNIT/ML FlexPen INJECT UP TO 70 UNITS SUBCUTANEOUSLY ONCE DAILY  ? Insulin Pen Needle (RELION PEN NEEDLE 31G/8MM) 31G X 8 MM MISC USE TO INJECT LEVEMIR DAILY  ? lisinopril (ZESTRIL) 30 MG tablet TAKE ONE TABLET BY MOUTH EVERY EVENING  ? nystatin cream (MYCOSTATIN) Apply 1 application topically 2 (two) times daily.  ? TRULICITY 6.73 AL/9.3XT SOPN INJECT 0.'75MG'$  (0.5ML) UNDER THE SKIN ONCE A WEEK  ? ?No facility-administered encounter medications on file as of 07/02/2021.  ? ?Recent Relevant Labs: ?Lab Results  ?Component Value Date/Time  ? HGBA1C 10.5 (A) 04/21/2021 10:01 AM  ? HGBA1C 9.1 (H) 08/08/2020 10:03 AM  ? HGBA1C 9.5 (A) 04/16/2020 09:27 AM  ? HGBA1C 9.8 (H) 01/04/2020 08:02 AM  ? MICROALBUR 1.6 01/04/2020 08:02 AM  ? MICROALBUR 0.7 03/07/2018 09:41 AM  ?  ?Kidney  Function ?Lab Results  ?Component Value Date/Time  ? CREATININE 1.10 08/30/2020 03:00 PM  ? CREATININE 1.15 08/22/2020 01:18 AM  ? CREATININE 1.20 08/20/2020 01:55 AM  ? GFR 66.20 08/08/2020 10:03 AM  ? GFRNONAA >60 08/22/2020 01:18 AM  ? GFRAA >60 05/17/2016 06:59 AM  ? ?Contacted patient on 07/02/2021 to discuss diabetes disease state.  ? ?Current antihyperglycemic regimen:  ?Levemir - Inject 70 units daily at bedtime (split in 2 doses)  ?Glipizide 5 mg - 2 in the morning and 1 at night  ?Jardiance 10 mg - 1 tablet daily ?Trulicity 0.24 mg - Inject once weekly - on Monday nights  ?Patient verbally confirms he is taking the above medications as directed. Yes ? ?What diet changes have been made to improve diabetes control? Patient has been watching his sugar and carbs.  ? ?What recent interventions/DTPs have been made to improve glycemic control:  ?Patient was to resume his Trulicity ? ?Have there been any recent hospitalizations or ED visits since last visit with CPP? No ? ?Patient denies hypoglycemic symptoms, including Pale, Sweaty, Shaky, Hungry, Nervous/irritable, and Vision changes ? ?Patient denies hyperglycemic symptoms, including blurry vision, excessive thirst, fatigue, polyuria, and weakness ? ?How often are you checking your blood sugar? once daily in the morning while fasting ? ?Date   Blood Sugar  Blood Pressure Pulse ?03/22  144   121/98   88 ?03/21  157   119/77   80 ?03/20  148   127/81   89 ?03/19  157  114/72   80 ?03/18  168   112/70   92 ?03/17  161   119/79   87 ?03/16  114   124/76   86 ?03/15  182   115/76   91 ?03/14  109   116/82   81 ?03/13  115   140/84   82 ? ?During the week, how often does your blood glucose drop below 70? Never ? ?Are you checking your feet daily/regularly? Yes ? ?Adherence Review: ?Is the patient currently on a STATIN medication? Yes ?Is the patient currently on ACE/ARB medication? Yes ?Does the patient have >5 day gap between last estimated fill dates? No ? ?Care  Gaps: ?Annual wellness visit in last year? Yes 06/24/2021 ?Most recent A1C reading: 10.5 on 04/21/2021 ?Most Recent BP reading: 80/50 on 04/28/2021 ? ?Last eye exam / retinopathy screening: Up to date ?Last diabetic foot exam: Up to date ? ?Star Rating Drugs:  ?Medication:  Last Fill: Day Supply ?Atorvastatin 80 mg 06/16/2021 30 ?Glipizide 5 mg  06/16/2021 30  ?Lisinopril 30 mg 06/16/2021 30  ? ?PCP appointment on 07/18/2021 ?CCM 09/04/2021 ? ?[name], CPP notified ? ? ?[name], RMA ?Clinical Pharmacy Assistant ?[phone] ?. ? ? ?

## 2021-07-04 ENCOUNTER — Ambulatory Visit: Payer: Medicare Other | Admitting: Podiatry

## 2021-07-04 ENCOUNTER — Other Ambulatory Visit: Payer: Self-pay

## 2021-07-04 DIAGNOSIS — E1142 Type 2 diabetes mellitus with diabetic polyneuropathy: Secondary | ICD-10-CM | POA: Diagnosis not present

## 2021-07-04 DIAGNOSIS — E08621 Diabetes mellitus due to underlying condition with foot ulcer: Secondary | ICD-10-CM | POA: Diagnosis not present

## 2021-07-04 DIAGNOSIS — L97511 Non-pressure chronic ulcer of other part of right foot limited to breakdown of skin: Secondary | ICD-10-CM | POA: Diagnosis not present

## 2021-07-09 NOTE — Progress Notes (Signed)
?Subjective:  ?Patient ID: LINKOLN ALKIRE, male    DOB: Aug 25, 1940,  MRN: [id number] ? ?Chief Complaint  ?Patient presents with  ? Follow-up  ?  Wound check- doing much better- no drainage or bleeding- pt reports he is has no concerns at the moment   ? ? ?81 y.o. male presents for wound care.  Patient presents for follow-up to left submetatarsal 5 ulceration.  He states he is doing a lot better.  It has completely healed.  He does not have any drainage or bleeding. ? ? ?Review of Systems: Negative except as noted in the HPI. Denies N/V/F/Ch. ? ?Past Medical History:  ?Diagnosis Date  ? Arthritis   ? Blood transfusion without reported diagnosis   ? ? in 2017 after crushing injury   ? Cataract   ? right eye removed   ? Crushing injury of pelvic region   ? Diabetes mellitus without complication ([place])   ? Hyperlipidemia   ? Hypertension   ? ? ?Current Outpatient Medications:  ?  amLODipine (NORVASC) 5 MG tablet, Take 1 tablet (5 mg total) by mouth daily., Disp: 90 tablet, Rfl: 2 ?  atorvastatin (LIPITOR) 80 MG tablet, TAKE ONE TABLET BY MOUTH EVERY EVENING, Disp: 90 tablet, Rfl: 2 ?  empagliflozin (JARDIANCE) 10 MG TABS tablet, Take 1 tablet (10 mg total) by mouth daily before breakfast., Disp: 90 tablet, Rfl: 3 ?  glipiZIDE (GLUCOTROL) 5 MG tablet, TAKE TWO TABLETS BY MOUTH EVERY MORNING and TAKE ONE TABLET BY MOUTH EVERY EVENING, Disp: 270 tablet, Rfl: 2 ?  glucose blood (ONETOUCH ULTRA) test strip, USE 1 STRIP TO CHECK GLUCOSE ONCE DAILY, Disp: 100 each, Rfl: 3 ?  hydrochlorothiazide (HYDRODIURIL) 12.5 MG tablet, Take 1 tablet (12.5 mg total) by mouth daily., Disp: 90 tablet, Rfl: 3 ?  insulin detemir (LEVEMIR FLEXTOUCH) 100 UNIT/ML FlexPen, INJECT UP TO 70 UNITS SUBCUTANEOUSLY ONCE DAILY, Disp: 45 mL, Rfl: 1 ?  Insulin Pen Needle (RELION PEN NEEDLE 31G/8MM) 31G X 8 MM MISC, USE TO INJECT LEVEMIR DAILY, Disp: 100 each, Rfl: 3 ?  lisinopril (ZESTRIL) 30 MG tablet, TAKE ONE TABLET BY MOUTH EVERY EVENING, Disp: 30  tablet, Rfl: 2 ?  nystatin cream (MYCOSTATIN), Apply 1 application topically 2 (two) times daily., Disp: 30 g, Rfl: 1 ?  TRULICITY 2.40 XB/3.5HG SOPN, INJECT 0.'75MG'$  (0.5ML) UNDER THE SKIN ONCE A WEEK, Disp: 2 mL, Rfl: 4 ? ?Social History  ? ?Tobacco Use  ?Smoking Status Never  ?Smokeless Tobacco Never  ? ? ?No Known Allergies ?Objective:  ?There were no vitals filed for this visit. ?There is no height or weight on file to calculate BMI. ?Constitutional Well developed. ?Well nourished.  ?Vascular Dorsalis pedis pulses palpable bilaterally. ?Posterior tibial pulses palpable bilaterally. ?Capillary refill normal to all digits.  ?No cyanosis or clubbing noted. ?Pedal hair growth normal.  ?Neurologic Normal speech. ?Oriented to person, place, and time. ?Protective sensation absent  ?Dermatologic Left submetatarsal 5 skin completely reepithelialized.  No clinical signs of De[place] is noted no complication noted.  ?Orthopedic: No pain to palpation either foot.  ? ?Radiographs: None ?Assessment:  ? ?1. DM type 2 with diabetic peripheral neuropathy ([place])   ?2. 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 ? ?Plan:  ?Patient was evaluated and treated and all questions answered. ? ?Ulcer left submetatarsal 5 ulceration with fat layer exposed ?-Clinically healed.  I discussed shoe gear  modification.  If any foot and ankle issues arise in the future I will asked him to come back and see me.  He states understanding. ? ?No follow-ups on file. ? ?  ?

## 2021-07-11 ENCOUNTER — Telehealth: Payer: Self-pay

## 2021-07-11 NOTE — Progress Notes (Signed)
? ? ?Chronic Care Management ?Pharmacy Assistant  ? ?Name: Christian [name]  MRN: [id number] DOB: July 14, 1940 ? ?Reason for Encounter: CCM (Medication Adherence and Delivery Coordination) ?  ?Recent office visits:  ?None since last CCM contact ? ?Recent consult visits:  ?07/04/21 Boneta Lucks, DPM (Podiatry): Diabetic peripheral neuropathy. Wound check. No concerns.  ? ?Hospital visits:  ?None in previous 6 months ? ?Medications: ?Outpatient Encounter Medications as of 07/11/2021  ?Medication Sig  ? amLODipine (NORVASC) 5 MG tablet Take 1 tablet (5 mg total) by mouth daily.  ? atorvastatin (LIPITOR) 80 MG tablet TAKE ONE TABLET BY MOUTH EVERY EVENING  ? empagliflozin (JARDIANCE) 10 MG TABS tablet Take 1 tablet (10 mg total) by mouth daily before breakfast.  ? glipiZIDE (GLUCOTROL) 5 MG tablet TAKE TWO TABLETS BY MOUTH EVERY MORNING and TAKE ONE TABLET BY MOUTH EVERY EVENING  ? glucose blood (ONETOUCH ULTRA) test strip USE 1 STRIP TO CHECK GLUCOSE ONCE DAILY  ? hydrochlorothiazide (HYDRODIURIL) 12.5 MG tablet Take 1 tablet (12.5 mg total) by mouth daily.  ? insulin detemir (LEVEMIR FLEXTOUCH) 100 UNIT/ML FlexPen INJECT UP TO 70 UNITS SUBCUTANEOUSLY ONCE DAILY  ? Insulin Pen Needle (RELION PEN NEEDLE 31G/8MM) 31G X 8 MM MISC USE TO INJECT LEVEMIR DAILY  ? lisinopril (ZESTRIL) 30 MG tablet TAKE ONE TABLET BY MOUTH EVERY EVENING  ? nystatin cream (MYCOSTATIN) Apply 1 application topically 2 (two) times daily.  ? TRULICITY 9.56 OZ/3.0QM SOPN INJECT 0.'75MG'$  (0.5ML) UNDER THE SKIN ONCE A WEEK  ? ?No facility-administered encounter medications on file as of 07/11/2021.  ? ?BP Readings from Last 3 Encounters:  ?04/28/21 (!) 80/50  ?04/21/21 110/64  ?11/07/20 120/64  ?  ?Lab Results  ?Component Value Date  ? HGBA1C 10.5 (A) 04/21/2021  ?  ?Recent OV, Consult or Hospital visit:  ?No medication changes indicated ? ?Last adherence delivery date: 06/23/2021    ? ?Patient is due for next adherence delivery on: 07/22/2021 ? ?Spoke with  patient on 07/11/2021 reviewed medications and coordinated delivery. ? ?This delivery to include: Adherence Packaging   30 days  ?Packs: ?Prevagen chew 30 take 1 tablet breakfast ?Glipizide 5 mg - 2 tablets breakfast,1 tablet evening meal  ?Atorvastatin '80mg'$  take 1 tablet evening meal ?Amlodipine '5mg'$  take 1 tablet breakfast ?Hydrochlorothiazide 12.'5mg'$  take 1 tablet breakfast ?Lisinopril '30mg'$  take 1 tablet evening meal ?  ?VIAL medications:  ?One Touch Test Strips ?  ?Can not be filled until 09/24/2021 ?Trueplus Pen Needles ?  ?Patient declined the following medications this month: ?Levemir - Inject 70 units daily (patient prefers to get this from [facility]) ?Jardiance 10 mg - 1 tablet morning - gets from manufacturer ?Trulicity 5.78 mg  Inject once weekly  - gets from manufacturer ?  ?Any concerns about your medications? No ?  ?How often do you forget or accidentally miss a dose? Rarely ?  ?Do you use a pillbox? No ?  ?Is patient in packaging Yes ? If yes ? What is the date on your next pill pack? 07/11/2021 evening meal ? Any concerns or issues with your packaging? No ? ?No refill request needed. ? ?Confirmed delivery date of 04/11, advised patient that pharmacy will contact them the morning of delivery.  ? ?Recent readings are as follows: ?Date  Blood Glucose Blood Pressure Pulse ?03/31  107   131/82   75 ?03/30  123   128/77   78 ?03/29  117   114/77   78 ?03/28  123   141/86  78 ?03/27  131   127/97   75 ?*03/26  154   116/79   79  ?*Blood Glucose was high as the day before family had a surprise birthday party for patient.  ?Patient had cake and ice cream.  ? ?Annual wellness visit in last year? Yes 06/24/2021 ?Most Recent BP reading: 80/50 on 04/28/2021 ? ?If Diabetic: ?Most recent A1C reading: 10.5 on 04/21/2021 ?Last eye exam / retinopathy screening: Up to date ?Last diabetic foot exam: Up to date ? ?[name], CPP notified ? ?[name], RMA ?Clinical Pharmacy Assistant ?[phone] ? ? ? ?

## 2021-07-18 ENCOUNTER — Ambulatory Visit: Payer: Medicare Other | Admitting: Family Medicine

## 2021-07-21 ENCOUNTER — Ambulatory Visit (INDEPENDENT_AMBULATORY_CARE_PROVIDER_SITE_OTHER): Payer: Medicare Other | Admitting: Family Medicine

## 2021-07-21 ENCOUNTER — Ambulatory Visit (INDEPENDENT_AMBULATORY_CARE_PROVIDER_SITE_OTHER)
Admission: RE | Admit: 2021-07-21 | Discharge: 2021-07-21 | Disposition: A | Discharge status: Home / Self Care | Payer: Medicare Other | Source: Ambulatory Visit | Attending: Family Medicine | Admitting: Family Medicine

## 2021-07-21 ENCOUNTER — Encounter: Payer: Self-pay | Admitting: Family Medicine

## 2021-07-21 VITALS — BP 130/70 | HR 99 | Temp 97.1°F | Ht 72.0 in | Wt 210.0 lb

## 2021-07-21 DIAGNOSIS — I10 Essential (primary) hypertension: Secondary | ICD-10-CM | POA: Diagnosis not present

## 2021-07-21 DIAGNOSIS — M25579 Pain in unspecified ankle and joints of unspecified foot: Secondary | ICD-10-CM

## 2021-07-21 DIAGNOSIS — E1129 Type 2 diabetes mellitus with other diabetic kidney complication: Secondary | ICD-10-CM | POA: Diagnosis not present

## 2021-07-21 DIAGNOSIS — E785 Hyperlipidemia, unspecified: Secondary | ICD-10-CM

## 2021-07-21 DIAGNOSIS — R809 Proteinuria, unspecified: Secondary | ICD-10-CM

## 2021-07-21 DIAGNOSIS — E1142 Type 2 diabetes mellitus with diabetic polyneuropathy: Secondary | ICD-10-CM | POA: Diagnosis not present

## 2021-07-21 DIAGNOSIS — M25572 Pain in left ankle and joints of left foot: Secondary | ICD-10-CM | POA: Diagnosis not present

## 2021-07-21 DIAGNOSIS — Z7189 Other specified counseling: Secondary | ICD-10-CM

## 2021-07-21 DIAGNOSIS — M545 Low back pain, unspecified: Secondary | ICD-10-CM

## 2021-07-21 DIAGNOSIS — M7989 Other specified soft tissue disorders: Secondary | ICD-10-CM | POA: Diagnosis not present

## 2021-07-21 DIAGNOSIS — Z0000 Encounter for general adult medical examination without abnormal findings: Secondary | ICD-10-CM

## 2021-07-21 LAB — TSH: TSH: 3.66 u[IU]/mL (ref 0.35–5.50)

## 2021-07-21 LAB — COMPREHENSIVE METABOLIC PANEL
ALT: 11 U/L (ref 0–53)
AST: 14 U/L (ref 0–37)
Albumin: 4.2 g/dL (ref 3.5–5.2)
Alkaline Phosphatase: 94 U/L (ref 39–117)
BUN: 27 mg/dL — ABNORMAL HIGH (ref 6–23)
CO2: 25 mEq/L (ref 19–32)
Calcium: 9.6 mg/dL (ref 8.4–10.5)
Chloride: 104 mEq/L (ref 96–112)
Creatinine, Ser: 1.3 mg/dL (ref 0.40–1.50)
GFR: 52.06 mL/min — ABNORMAL LOW (ref >60.00)
Glucose, Bld: 145 mg/dL — ABNORMAL HIGH (ref 70–99)
Potassium: 4.5 mEq/L (ref 3.5–5.1)
Sodium: 141 mEq/L (ref 135–145)
Total Bilirubin: 1 mg/dL (ref 0.2–1.2)
Total Protein: 7 g/dL (ref 6.0–8.3)

## 2021-07-21 LAB — CBC WITH DIFFERENTIAL/PLATELET
Basophils Absolute: 0.1 10*3/uL (ref 0.0–0.1)
Basophils Relative: 0.9 % (ref 0.0–3.0)
Eosinophils Absolute: 0.3 10*3/uL (ref 0.0–0.7)
Eosinophils Relative: 4.2 % (ref 0.0–5.0)
HCT: 41.1 % (ref 39.0–52.0)
Hemoglobin: 13.5 g/dL (ref 13.0–17.0)
Lymphocytes Relative: 29.2 % (ref 12.0–46.0)
Lymphs Abs: 2.3 10*3/uL (ref 0.7–4.0)
MCHC: 32.9 g/dL (ref 30.0–36.0)
MCV: 88.5 fl (ref 78.0–100.0)
Monocytes Absolute: 0.6 10*3/uL (ref 0.1–1.0)
Monocytes Relative: 7.4 % (ref 3.0–12.0)
Neutro Abs: 4.5 10*3/uL (ref 1.4–7.7)
Neutrophils Relative %: 58.3 % (ref 43.0–77.0)
Platelets: 205 10*3/uL (ref 150.0–400.0)
RBC: 4.64 Mil/uL (ref 4.22–5.81)
RDW: 14.4 % (ref 11.5–15.5)
WBC: 7.7 10*3/uL (ref 4.0–10.5)

## 2021-07-21 LAB — LDL CHOLESTEROL, DIRECT: Direct LDL: 48 mg/dL

## 2021-07-21 LAB — LIPID PANEL
Cholesterol: 123 mg/dL (ref 0–200)
HDL: 31.9 mg/dL — ABNORMAL LOW (ref >39.00)
NonHDL: 91.04
Total CHOL/HDL Ratio: 4
Triglycerides: 379 mg/dL — ABNORMAL HIGH (ref 0.0–149.0)
VLDL: 75.8 mg/dL — ABNORMAL HIGH (ref 0.0–40.0)

## 2021-07-21 LAB — HEMOGLOBIN A1C: Hgb A1c MFr Bld: 9.3 % — ABNORMAL HIGH (ref 4.6–6.5)

## 2021-07-21 MED ORDER — LEVEMIR FLEXTOUCH 100 UNIT/ML ~~LOC~~ SOPN: PEN_INJECTOR | SUBCUTANEOUS | 3 refills | Status: DC

## 2021-07-21 NOTE — Progress Notes (Signed)
Diabetes:  ?Using medications without difficulties: yes ?Hypoglycemic episodes:no ?Hyperglycemic episodes:no ?Feet problems: h/o osteomyelitis.  See foot exam.   ?Blood Sugars averaging:100-120s ?eye exam within last year: yes ?Taking 70 units split into 2 doses at night.   ? ?Elevated Cholesterol: ?Using medications without problems: yes ?Muscle aches: some cramping in the L lateral shin at night.   ?Diet compliance:yes ?Exercise: d/w pt.   ? ?Hypertension:    ?Using medication without problems or lightheadedness:  yes ?Chest pain with exertion: no ?Edema: some ankle swelling at night, resolves by the next AM.  He has prev left ankle injury, he felt a pop in his left ankle last month, lateral ankle.  Swelling noted in the meantime.  ?Short of breath:no ? ?He had prev back injections for pain and he was asking about getting that set up again.  See AVS.  ? ?Flu 2022 ?Shingles 2020 ?PNA up-to-date ?Tetanus 2016 ?covid vaccine 2021 ?Colonoscopy 2021 ?Prostate cancer screening declined.  Discussed with patient. ?Advance directive-wife and oldest daughter Ebony Hail equally designated if patient were incapacitated. ? ?PMH and SH reviewed ? ?Meds, vitals, and allergies reviewed.  ? ?ROS: Per HPI unless specifically indicated in ROS section  ? ?GEN: nad, alert and oriented ?HEENT: ncat ?NECK: supple w/o LA ?CV: rrr. ?PULM: ctab, no inc wob ?ABD: soft, +bs ?EXT: L ankle edema, medial and lat mal not ttp.  Achilles not ttp.   ?SKIN: no acute rash ?Normal plantar and dorsiflexion B feet.   ? ?Diabetic foot exam: ?Normal inspection ?No skin breakdown ?Calluses R 1st MT and L 5th MT ?Normal DP pulses ?Dec sensation to light touch and monofilament ?Nails normal ?

## 2021-07-21 NOTE — Patient Instructions (Addendum)
Go to the lab on the way out.   If you have mychart we'll likely use that to update you.    ?Take care.  Glad to see you. ?Ask Dr. Clar[facility] about getting the back injections set up and ask about the cost.   ?See about getting an xray done at [facility].  ?

## 2021-07-23 DIAGNOSIS — M25579 Pain in unspecified ankle and joints of unspecified foot: Secondary | ICD-10-CM | POA: Insufficient documentation

## 2021-07-23 NOTE — Assessment & Plan Note (Signed)
X-rays currently down at our clinic, discussed with patient.  He can go for x-rays at outpatient site.  See notes on results.  Able to bear weight.  Given duration of approximately 1 month then okay for outpatient follow-up. ?

## 2021-07-23 NOTE — Assessment & Plan Note (Signed)
?  He had prev back injections for pain and he was asking about getting that set up again.  See AVS.  ?

## 2021-07-23 NOTE — Assessment & Plan Note (Signed)
Continue atorvastatin.  See notes on labs. 

## 2021-07-23 NOTE — Assessment & Plan Note (Signed)
Continue insulin and Jardiance.  Continue glipizide and Trulicity.  See notes on labs.  Continue work on diet and exercise. ?

## 2021-07-23 NOTE — Assessment & Plan Note (Signed)
Flu 2022 ?Shingles 2020 ?PNA up-to-date ?Tetanus 2016 ?covid vaccine 2021 ?Colonoscopy 2021 ?Prostate cancer screening declined.  Discussed with patient. ?Advance directive-wife and oldest daughter Ebony Hail equally designated if patient were incapacitated. ?

## 2021-07-23 NOTE — Assessment & Plan Note (Signed)
Advance directive-wife and oldest daughter Ebony Hail equally designated if patient were incapacitated. ?

## 2021-07-23 NOTE — Assessment & Plan Note (Signed)
Continue amlodipine hydrochlorothiazide and lisinopril.  See notes on labs. ?

## 2021-08-05 ENCOUNTER — Ambulatory Visit: Payer: Medicare Other | Admitting: Family Medicine

## 2021-08-08 ENCOUNTER — Other Ambulatory Visit: Payer: Self-pay | Admitting: Family Medicine

## 2021-08-11 ENCOUNTER — Telehealth: Payer: Self-pay | Admitting: Pharmacist

## 2021-08-11 ENCOUNTER — Telehealth: Payer: Self-pay

## 2021-08-11 MED ORDER — TRESIBA FLEXTOUCH 200 UNIT/ML ~~LOC~~ SOPN: 70.0000 [IU] | PEN_INJECTOR | Freq: Every day | SUBCUTANEOUS | 3 refills | Status: DC

## 2021-08-11 NOTE — Progress Notes (Signed)
? ? ?Chronic Care Management ?Pharmacy Assistant  ? ?Name: Christian [name]  MRN: [id number] DOB: September 18, 1940 ? ?Reason for Encounter: CCM (Medication Adherence and Delivery Coordination) ?  ?Recent office visits:  ?07/21/21 Elsie Stain, MD: Medicare Wellness Abnormal Labs "His triglycerides are still elevated but is LDL is controlled.  His A1c is still elevated but better than prior.  It would be difficult to get his triglycerides controlled until his sugar is better.  Since his sugar has been improving recently, I would continue as is and recheck in 3 months with expectation that his A1c will be better at that point.  If he is noting higher sugars at home in the meantime then please let me know.  His other labs are okay/stable." No med changes.  ? ?Recent consult visits:  ?None since last CCM contact ? ?Hospital visits:  ?None in previous 6 months ? ?Medications: ?Outpatient Encounter Medications as of 08/11/2021  ?Medication Sig  ? amLODipine (NORVASC) 5 MG tablet Take 1 tablet (5 mg total) by mouth daily.  ? atorvastatin (LIPITOR) 80 MG tablet TAKE ONE TABLET BY MOUTH EVERY EVENING  ? empagliflozin (JARDIANCE) 10 MG TABS tablet Take 1 tablet (10 mg total) by mouth daily before breakfast.  ? glipiZIDE (GLUCOTROL) 5 MG tablet TAKE TWO TABLETS BY MOUTH EVERY MORNING and TAKE ONE TABLET BY MOUTH EVERY EVENING  ? glucose blood (ONETOUCH ULTRA) test strip USE 1 STRIP TO CHECK GLUCOSE ONCE DAILY  ? hydrochlorothiazide (HYDRODIURIL) 12.5 MG tablet Take 1 tablet (12.5 mg total) by mouth daily.  ? insulin detemir (LEVEMIR FLEXTOUCH) 100 UNIT/ML FlexPen INJECT UP TO 70 UNITS SUBCUTANEOUSLY ONCE DAILY  ? Insulin Pen Needle (RELION PEN NEEDLE 31G/8MM) 31G X 8 MM MISC USE TO INJECT LEVEMIR DAILY  ? lisinopril (ZESTRIL) 30 MG tablet TAKE ONE TABLET BY MOUTH EVERY EVENING  ? nystatin cream (MYCOSTATIN) Apply 1 application topically 2 (two) times daily.  ? TRULICITY 0.93 OI/7.1IW SOPN INJECT 0.'75MG'$  (0.5ML) UNDER THE SKIN ONCE A WEEK   ? ?No facility-administered encounter medications on file as of 08/11/2021.  ? ?BP Readings from Last 3 Encounters:  ?07/21/21 130/70  ?04/28/21 (!) 80/50  ?04/21/21 110/64  ?  ?Lab Results  ?Component Value Date  ? HGBA1C 9.3 (H) 07/21/2021  ?  ?Recent OV, Consult or Hospital visit:  ?No medication changes indicated ? ?Last adherence delivery date: 07/22/2021     ? ?Patient is due for next adherence delivery on: 08/21/2021 ? ?Spoke with patient on 08/11/2021 and reviewed medications. Patient denied the need for any medications at this time. ? ?This delivery to include: Adherence Packaging   30 days  ?Packs: ?Prevagen chew 30 take 1 tablet breakfast ?Glipizide 5 mg - 2 tablets breakfast,1 tablet evening meal  ?Atorvastatin '80mg'$  take 1 tablet evening meal ?Amlodipine '5mg'$  take 1 tablet breakfast ?Hydrochlorothiazide 12.'5mg'$  take 1 tablet breakfast ?Lisinopril '30mg'$  take 1 tablet evening meal ?  ?Can not be filled until 09/24/2021 ?Trueplus Pen Needles ?One Touch Test Strips  ? ?Patient declined the following medications this month: ?Jardiance 10 mg - 1 tablet morning - gets from manufacturer ?Trulicity 5.80 mg  Inject once weekly  - gets from manufacturer ?Tyler Aas Flextouch 200 Unit - Patient will get from [facility] ? ?No longer using as of 08/12/2021 ?Levemir - Inject 70 units daily (patient prefers to get this from [facility]) ? ?Any concerns about your medications? Yes - Patient states he is having a difficult time with the [place]. Patient states he did  not have to push down on the Flextouch to dose his medication as he now hoes with the Flexpen. Patient is now bending the needles. Patient was asking if he could still get the Flexpen. I advised patient the Casey Burkitt has been discounted as of February. Patient was asking if he could take anything else that would be easier for him that he would not have to push down to dose. I have consulted [name] and advised patient I would call him back with an answer.   ?  ?How often do you forget or accidentally miss a dose? Rarely ?  ?Do you use a pillbox? No ? ?Is patient in packaging Yes ? If yes ? What is the date on your next pill pack? 08/11/2021 ? Any concerns or issues with your packaging? No ? ?Refills requested from providers include: ?Lisinopril '30mg'$  take 1 tablet evening meal ? ?Confirmed delivery date of 08/21/2021, advised patient that pharmacy will contact them the morning of delivery.  ? ?Readings are as follows: Glucose readings are fasting.  ? ?Date  Glucose Blood Pressure Pulse ?05/01  137  132/87   78 ?04/30  139  159/86   76 ?04/29  162  181/89   72 ?04/28  138  135/85   76 ?04/27  160  120/76   81 ?04/26  165  148/85   80 ?04/25  140  153/89   78 ?04/24  146  146/88   80 ? ?Annual wellness visit in last year? Yes 06/24/2021 ?Most Recent BP reading: 130/70 on 07/21/2021 ?  ?If Diabetic: ?Most recent A1C reading: 9.3 on 07/21/2021 ?Last eye exam / retinopathy screening: Up to date ?Last diabetic foot exam: Up to date ?  ?[name], CPP notified ?  ?[name], RMA ?Clinical Pharmacy Assistant ?[phone] ? ?

## 2021-08-11 NOTE — Telephone Encounter (Signed)
Levemir has recently discontinued their Flextouch pen and converted to Flexpen device. Mr Oguinn reports he has more trouble with the Flexpen - the needle apparatus is different and he keeps bending/breaking needles. He would prefer to go back to a Flextouch device.  ? ?The only Flextouch device still on the market is [place] - pt may actually benefit clinically from switching to [place] as it is a longer acting insulin, and is available as a more concentrated solution (200 un/mL) which would help with absorption of his relatively high 70 units/day dose - he would not have to split the dose up. ? ?Lab Results  ?Component Value Date/Time  ? HGBA1C 9.3 (H) 07/21/2021 12:49 PM  ? HGBA1C 10.5 (A) 04/21/2021 10:01 AM  ? HGBA1C 9.1 (H) 08/08/2020 10:03 AM  ? ? ?Recommend changing Levemir to Tresiba 200 un/mL Flextouch. Would continue 70 units daily in 1 dose. ? ?Forwarding to PCP for approval. ? ?Preferred pharmacy: ?[facility] - [place], [place] - [address] ?[address] ?[place] [address] ?Phone: [phone] Fax: [phone] ? ? ? ? ?

## 2021-08-11 NOTE — Addendum Note (Signed)
Addended by: Tonia Ghent on: 08/11/2021 01:57 PM ? ? Modules accepted: Orders ? ?

## 2021-08-11 NOTE — Telephone Encounter (Signed)
Agreed.  Thanks.  I sent the rx for tresiba.   ?

## 2021-08-12 MED ORDER — TRESIBA FLEXTOUCH 200 UNIT/ML ~~LOC~~ SOPN: 70.0000 [IU] | PEN_INJECTOR | Freq: Every day | SUBCUTANEOUS | 3 refills | Status: DC

## 2021-08-12 NOTE — Addendum Note (Signed)
Addended by: Charlton Haws on: 08/12/2021 01:15 PM ? ? Modules accepted: Orders ? ?

## 2021-08-12 NOTE — Progress Notes (Signed)
Patient has been informed that his Levemir Flexpen has been changed to [place].  ? ?[name], CPP notified ? ?[name], RMA ?Clinical Pharmacy Assistant ?[phone] ? ? ? ?

## 2021-08-19 ENCOUNTER — Other Ambulatory Visit: Payer: Self-pay | Admitting: Family Medicine

## 2021-08-29 ENCOUNTER — Telehealth: Payer: Self-pay

## 2021-08-29 NOTE — Progress Notes (Signed)
    Chronic Care Management Pharmacy Assistant   Name: THOMPSON MCKIM  MRN: [id number] DOB: 01-07-1941  Reason for Encounter: CCM (Appointment Reminder)  Medications: Outpatient Encounter Medications as of 08/29/2021  Medication Sig   amLODipine (NORVASC) 5 MG tablet Take 1 tablet (5 mg total) by mouth daily.   atorvastatin (LIPITOR) 80 MG tablet TAKE ONE TABLET BY MOUTH EVERY EVENING   empagliflozin (JARDIANCE) 10 MG TABS tablet Take 1 tablet (10 mg total) by mouth daily before breakfast.   glipiZIDE (GLUCOTROL) 5 MG tablet TAKE TWO TABLETS BY MOUTH EVERY MORNING and TAKE ONE TABLET BY MOUTH EVERY EVENING   glucose blood (ONETOUCH ULTRA) test strip USE 1 STRIP TO CHECK GLUCOSE ONCE DAILY   hydrochlorothiazide (HYDRODIURIL) 12.5 MG tablet Take 1 tablet (12.5 mg total) by mouth daily.   insulin degludec (TRESIBA FLEXTOUCH) 200 UNIT/ML FlexTouch Pen Inject 70 Units into the skin daily.   Insulin Pen Needle (RELION PEN NEEDLE 31G/8MM) 31G X 8 MM MISC USE TO INJECT LEVEMIR DAILY   lisinopril (ZESTRIL) 30 MG tablet TAKE ONE TABLET BY MOUTH EVERY EVENING   nystatin cream (MYCOSTATIN) Apply 1 application topically 2 (two) times daily.   TRULICITY 2.68 TM/1.9QQ SOPN INJECT 0.'75MG'$  (0.5ML) UNDER THE SKIN ONCE A WEEK   No facility-administered encounter medications on file as of 08/29/2021.   OMEGA SLAGER was contacted to remind of upcoming telephone visit with [name] on 09/04/2021 at 3:45. Patient was reminded to have any blood glucose and blood pressure readings available for review at appointment.   Patient confirmed appointment.  Are you having any problems with your medications? No   Do you have any concerns you like to discuss with the pharmacist? No  CCM referral has been placed prior to visit?  No   Star Rating Drugs: Medication:  Last Fill: Day Supply Trulicity 2.29 mg PAP Lisinopril 30 mg 08/15/2021 30 Glipizide 5 mg 08/15/2021  30 HCTZ 12.5  mg 08/15/2021  30 Atorvastatin 80 mg 08/15/2021 [place], CPP notified  [name], [place] Pharmacy Assistant [phone]

## 2021-09-04 ENCOUNTER — Ambulatory Visit: Payer: Medicare Other | Admitting: Pharmacist

## 2021-09-04 DIAGNOSIS — E785 Hyperlipidemia, unspecified: Secondary | ICD-10-CM

## 2021-09-04 DIAGNOSIS — E1142 Type 2 diabetes mellitus with diabetic polyneuropathy: Secondary | ICD-10-CM

## 2021-09-04 DIAGNOSIS — I10 Essential (primary) hypertension: Secondary | ICD-10-CM

## 2021-09-04 MED ORDER — FREESTYLE LIBRE 2 SENSOR MISC: 5 refills | Status: DC

## 2021-09-04 MED ORDER — FREESTYLE LIBRE 2 READER DEVI: 0 refills | Status: DC

## 2021-09-04 NOTE — Progress Notes (Signed)
Chronic Care Management  Pharmacy Note  05/06/2021 Name:  Christian [name] MRN:  [id number] DOB:  [birth year]  Summary: CCM F/U visit -DM: A1c 9.3% (07/2021), pt reports fasting BG 140-150 most of the time, occasionally up to ~200. He has not switched to [place] yet, he is using up the last of Levemir first. Reviewed benefits of Tresiba over Levemir -Reviewed benefits of CGM; pt is interested in trying it to help with diet decisions  Recommendation:  -Start Freestyle [place] 2  Follow up: -Pharmacist follow up televisit scheduled for 2 weeks for CGM training -PCP F/U 11/03/21    Subjective: Christian [name] is an 81 y.o. year old male who is a primary patient of [name], [name], MD.  The CCM team was consulted for assistance with disease management and care coordination needs.    Engaged with patient by telephone for follow up visit in response to provider referral for pharmacy case management   Consent to Services:  The patient was given information about Chronic Care Management services, agreed to services, and gave verbal consent prior to initiation of services.  Please see initial visit note for detailed documentation.   Patient Care Team: Tonia Ghent, MD as PCP - General (Family Medicine) Charlton Haws, [facility] as Pharmacist (Pharmacist)  Recent office visits: 04/21/21 - Elsie Stain, MD - Pt presented for diabetes follow up. A1c increased from 9.1 to 10.5%. Foot exam normal. Rectal bleeding, refer to GI. Fungal infection, start nystatin. Follow up 3 months. 03/26/21 - CCM diabetes follow up - Pt approved for PAP (Trulicity 6.76 mg weekly and Jardiance 10 mg), Start Trulicity this week. Improve diabetes diet/exercise.  Recent consult visits: 04/28/21 - NP Kennedy[place]Smith (GI) - Pt presented for rectal bleeding. Exam showed internal and external hemorrhoids without active bleeding. Use MiraLAX nightly PRN. Apply Desitin. Update CBC. BP 80/50, recheck BP came up to 110/58.    Hospital visits: None since last CCM contact  Objective:  Lab Results  Component Value Date   CREATININE 1.30 07/21/2021   BUN 27 (H) 07/21/2021   GFR 52.06 (L) 07/21/2021   GFRNONAA >60 08/22/2020   GFRAA >60 05/17/2016   NA 141 07/21/2021   K 4.5 07/21/2021   CALCIUM 9.6 07/21/2021   CO2 25 07/21/2021   GLUCOSE 145 (H) 07/21/2021    Lab Results  Component Value Date/Time   HGBA1C 9.3 (H) 07/21/2021 12:49 PM   HGBA1C 10.5 (A) 04/21/2021 10:01 AM   HGBA1C 9.1 (H) 08/08/2020 10:03 AM   GFR 52.06 (L) 07/21/2021 12:49 PM   GFR 66.20 08/08/2020 10:03 AM   MICROALBUR 1.6 01/04/2020 08:02 AM   MICROALBUR 0.7 03/07/2018 09:41 AM    Last diabetic Eye exam:  Lab Results  Component Value Date/Time   HMDIABEYEEXA No Retinopathy 08/30/2020 12:00 AM    Last diabetic Foot exam: 08/08/20  PCP diabetic foot ulcer  Lab Results  Component Value Date   CHOL 123 07/21/2021   HDL 31.90 (L) 07/21/2021   LDLDIRECT 48.0 07/21/2021   TRIG 379.0 (H) 07/21/2021   CHOLHDL 4 07/21/2021       Latest Ref Rng & Units 07/21/2021   12:49 PM 08/18/2020    1:24 PM 01/04/2020    8:02 AM  Hepatic Function  Total Protein 6.0 - 8.3 g/dL 7.0   6.7   7.3    Albumin 3.5 - 5.2 g/dL 4.2   3.3   4.2    AST 0 - 37 U/L 14  23   26    ALT 0 - 53 U/L _0 Alk Phosphatase 39 - 117 U/L 94   55   64    Total Bilirubin 0.2 - 1.2 mg/dL 1.0   0.9   0.9      Lab Results  Component Value Date/Time   TSH 3.66 07/21/2021 12:49 PM   TSH 3.337 03/06/2016 03:45 AM       Latest Ref Rng & Units 07/21/2021   12:49 PM 04/28/2021    2:21 PM 08/30/2020    3:00 PM  CBC  WBC 4.0 - 10.5 K/uL 7.7   9.4   9.2    Hemoglobin 13.0 - 17.0 g/dL 13.5   13.7   12.4    Hematocrit 39.0 - 52.0 % 41.1   41.5   38.0    Platelets 150.0 - 400.0 K/uL 205.0   213.0   222     Clinical ASCVD: No  The ASCVD Risk score (Arnett DK, et al., 2019) failed to calculate for the following reasons:   The 2019 ASCVD risk score is  only valid for ages 61 to 23       06/24/2021    2:09 PM 04/21/2021   10:02 AM 04/16/2020    9:01 AM  Depression screen PHQ 2/9  Decreased Interest 0 0 0  Down, Depressed, Hopeless 0 0 0  PHQ - 2 Score 0 0 0    Social History   Tobacco Use  Smoking Status Never  Smokeless Tobacco Never   BP Readings from Last 3 Encounters:  07/21/21 130/70  04/28/21 (!) 80/50  04/21/21 110/64   Pulse Readings from Last 3 Encounters:  07/21/21 99  04/28/21 (!) 104  04/21/21 (!) 101   Wt Readings from Last 3 Encounters:  07/21/21 210 lb (95.3 kg)  06/24/21 214 lb (97.1 kg)  04/28/21 214 lb 6 oz (97.2 kg)   BMI Readings from Last 3 Encounters:  07/21/21 28.48 kg/m  06/24/21 29.02 kg/m  04/28/21 28.67 kg/m    Assessment/Interventions: Review of patient past medical history, allergies, medications, health status, including review of consultants reports, laboratory and other test data, was performed as part of comprehensive evaluation and provision of chronic care management services.   SDOH:  (Social Determinants of Health) assessments and interventions performed: No - done March 2023  SDOH Screenings   Alcohol Screen: Low Risk    Last Alcohol Screening Score (AUDIT): 0  Depression (PHQ2-9): Low Risk    PHQ-2 Score: 0  Financial Resource Strain: Low Risk    Difficulty of Paying Living Expenses: Not hard at all  Food Insecurity: No Food Insecurity   Worried About Charity fundraiser in the Last Year: Never true   Ran Out of Food in the Last Year: Never true  Housing: Low Risk    Last Housing Risk Score: 0  Physical Activity: Inactive   Days of Exercise per Week: 0 days   Minutes of Exercise per Session: 0 min  Social Connections: Moderately Integrated   Frequency of Communication with Friends and Family: More than three times a week   Frequency of Social Gatherings with Friends and Family: More than three times a week   Attends Religious Services: More than 4 times per year    Active Member of [employer] or Organizations: No   Attends Archivist Meetings: Never   Marital Status: Married  Stress: No Stress Concern Present  Feeling of Stress : Only a little  Tobacco Use: Low Risk    Smoking Tobacco Use: Never   Smokeless Tobacco Use: Never   Passive Exposure: Not on file  Transportation Needs: No Transportation Needs   Lack of Transportation (Medical): No   Lack of Transportation (Non-Medical): No    CCM Care Plan  No Known Allergies  Medications Reviewed Today     Reviewed by Charlton Haws, [facility] (Pharmacist) on 09/04/21 at [place] List Status: <None>   Medication Order Taking? Sig Documenting Provider Last Dose Status Informant  amLODipine (NORVASC) 5 MG tablet [id number] Yes Take 1 tablet (5 mg total) by mouth daily. Tonia Ghent, MD Taking Active   atorvastatin (LIPITOR) 80 MG tablet [id number] Yes TAKE ONE TABLET BY MOUTH EVERY EVENING Tonia Ghent, MD Taking Active   empagliflozin (JARDIANCE) 10 MG TABS tablet [id number] Yes Take 1 tablet (10 mg total) by mouth daily before breakfast. Tonia Ghent, MD Taking Active            Med Note Rolan Bucco Sep 04, 2021  4:12 PM) Via [name] assistance  glipiZIDE (GLUCOTROL) 5 MG tablet [id number] Yes TAKE TWO TABLETS BY MOUTH EVERY MORNING and TAKE ONE TABLET BY MOUTH EVERY EVENING Tonia Ghent, MD Taking Active   glucose blood [facility] ULTRA) test strip [id number] Yes USE 1 STRIP TO CHECK GLUCOSE ONCE DAILY Tonia Ghent, MD Taking Active   hydrochlorothiazide (HYDRODIURIL) 12.5 MG tablet [id number] Yes Take 1 tablet (12.5 mg total) by mouth daily. Tonia Ghent, MD Taking Active   insulin degludec [facility]) 200 UNIT/ML FlexTouch Pen [id number] Yes Inject 70 Units into the skin daily. Tonia Ghent, MD Taking Active   Insulin Pen Needle (RELION PEN NEEDLE 31G/8MM) 31G X 8 MM MISC [id number] Yes USE TO INJECT LEVEMIR DAILY Tonia Ghent, MD Taking  Active   lisinopril (ZESTRIL) 30 MG tablet [id number] Yes TAKE ONE TABLET BY MOUTH EVERY EVENING Tonia Ghent, MD Taking Active   nystatin cream (MYCOSTATIN) [id number] Yes Apply 1 application topically 2 (two) times daily. Tonia Ghent, MD Taking Active   TRULICITY 4.28 JG/8.1LX [place] Aid [id number] Yes INJECT 0.75MG (0.5ML) UNDER THE SKIN ONCE A WEEK Tonia Ghent, MD Taking Active            Med Note Rolan Bucco Sep 04, 2021  4:12 PM) [place] assistance            Patient Active Problem List   Diagnosis Date Noted   Ankle pain 07/23/2021   BRBPR (bright red blood per rectum) 04/23/2021   Lumbar spondylosis 12/26/2020   Osteomyelitis of fifth toe of right foot ([place])    Diabetic foot infection ([place]) 08/18/2020   Diabetic foot ulcer ([place]) 08/11/2020   Healthcare maintenance 01/10/2020   Lower back pain 09/27/2019   Thumb pain 06/25/2019   Medicare annual wellness visit, subsequent 03/12/2018   Advance care planning 03/12/2018   Shoulder pain 02/03/2017   Edema 11/02/2016   Diabetes mellitus with microalbuminuria ([place]) 06/10/2016   HLD (hyperlipidemia) 06/10/2016   DM type 2 with diabetic peripheral neuropathy ([place])    HTN (hypertension)    Accident caused by farm tractor 01/15/2016   Colon polyps 06/24/2013    Immunization History  Administered Date(s) Administered   Fluad Quad(high Dose 65+) 01/05/2019, 01/09/2020, 04/01/2021   Influenza,inj,Quad PF,6+ Mos 02/20/2013, 01/03/2014, 02/05/2015, 01/15/2016, 03/26/2016, 02/02/2017  Influenza-Unspecified 04/13/2010, 01/11/2018   PFIZER(Purple Top)SARS-COV-2 Vaccination 05/23/2019, 06/17/2019   Pneumococcal Conjugate-13 01/06/2012, 01/03/2014   Pneumococcal Polysaccharide-23 12/19/2012   Td 05/14/2014   Zoster Recombinat (Shingrix) 10/27/2018, 01/31/2019   Zoster, Live 12/19/2014    Conditions to be addressed/monitored:  Hypertension, Hyperlipidemia, and Diabetes  Care Plan : [place]  Updates made by Charlton Haws, [place] since 09/04/2021 12:00 AM     Problem: Hypertension, Hyperlipidemia, and Diabetes   Priority: High     Long-Range Goal: Disease Management   Start Date: 07/18/2020  Expected End Date: 06/04/2022  This Visit's Progress: On track  Recent Progress: On track  Priority: High  Note:   Current Barriers: Unable to achieve control of diabetes   Pharmacist Clinical Goal(s):  Patient will adhere to plan to optimize therapeutic regimen for diabetes as evidenced by report of adherence to recommended medication management changes through collaboration with PharmD and provider.   Interventions: 1:1 collaboration with Tonia Ghent, MD regarding development and update of comprehensive plan of care as evidenced by provider attestation and co-signature Inter-disciplinary care team collaboration (see longitudinal plan of care) Comprehensive medication review performed; medication list updated in electronic medical record   Hypertension (BP goal <130/80) -Controlled - per patient reported home log -Current home readings: ~114/80, 80 bpm (uses an arm cuff for at home monitoring) -Current treatment: Amlodipine 5 mg daily - Appropriate, Effective, Safe, Accessible Lisinopril 30 mg daily - Appropriate, Effective, Safe, Accessible HCTZ 12.5 mg daily - Appropriate, Effective, Safe, Accessible -Medications previously tried: none -Continue to monitor BP at home daily, document, and provide log at future appointments -Recommended continue current medications   Diabetes (A1c goal <7%) -Uncontrolled, improving - A1c 9.3% (07/2021) improved from >13%, started Trulicity 2/44/01 and home BG improving overall, but still struggles with dietary indiscretions and high fasting BG periodically -He was switched from Levemir to [place] a few weeks ago but has not made the switch himself yet, he is finishing up his supply of Levemir first -Current home glucose  readings: Fasting: 141-150; up to 200 at times -Current medications: Tresiba 200 u/mL 70 units daily - Appropriate, Query Effective Glipizide 5 mg - 2 AM, 1 PM -  Appropriate, Query Effective Jardiance 10 mg daily (PAP)  - Appropriate, Query Effective Trulicity 0.27 mg weekly (PAP) - Appropriate, Query Effective -Medications previously tried: metformin 500 mg - severe GI upset  -Current meal patterns: high carb diet  -Reviewed benefits of Tresiba over Levemir (more concentrated form improved absorption, longer acting formulation) -Reviewed benefits of CGM, will likely help with diet decisions, pt agreed to trial -Recommend to start Freestyle [place] 2; pt to set up training with PharmD prn -Recommend to continue current medication  Hyperlipidemia: (LDL goal < 70) -Controlled - LDL 48 (07/2021) at goal -Current treatment: Atorvastatin 80 mg daily - Appropriate, Effective, Safe, Accessible -Medications previously tried: n/a  -Educated on Cholesterol goals; Benefits of statin for ASCVD risk reduction; -Recommended to continue current medication  Patient Goals/Self-Care Activities Patient will: - take medications as prescribed - check glucose daily, document, and provide at future appointments - check blood pressure with abnormal symptoms     Medication Assistance: [place] approved through 25/36/64 Trulicity - [employer] Cares approved through 04/12/22  Star Rating Drugs:  Atorvastatin 80 mg - PDC 95% Glipizide 5 mg - PDC 92%             Lisinopril 30 mg - PDC 92%   Medication Access:  Within the past 30 days, how often has patient missed a dose of medication? 0 Is a pillbox or other method used to improve adherence? Yes  Factors that may affect medication adherence? no barriers identified Are meds synced by current pharmacy? Yes  Are meds delivered by current pharmacy? Yes  Does patient experience delays in picking up medications due to transportation concerns? No    [facility] Reviewed: Is patient disadvantaged to use [facility]?: No  Current Rx insurance plan: [facility] Name and location of Current pharmacy:  [facility] - [place], [place] - [address] [place] [address] Phone: [phone] Fax: [phone]  30-day packs (last 08/21/21) Amlodipine 2.5 mg - 2 tabs at breakfast Atorvastatin 80 mg - 1 evening meal  Glipizide 5 mg - 2 breakfast, 1 evening meal Lisinopril 30 mg - 1 evening meal  Prevagen Regular Strength - 1 at breakfast  Care Plan and Follow Up Patient Decision:  Patient agrees to Care Plan and Follow-up.  Follow Up Plan: Telephone follow up appointment with care management team member scheduled for: 3 months  [name], PharmD, BCACP Clinical Pharmacist [facility] at [facility] [phone]

## 2021-09-04 NOTE — Patient Instructions (Signed)
Visit Information  Phone number for Pharmacist: [phone]   Goals Addressed   None     Care Plan : [place]  Updates made by Charlton Haws, [place] since 09/04/2021 12:00 AM     Problem: Hypertension, Hyperlipidemia, and Diabetes   Priority: High     Long-Range Goal: Disease Management   Start Date: 07/18/2020  Expected End Date: 06/04/2022  This Visit's Progress: On track  Recent Progress: On track  Priority: High  Note:   Current Barriers: Unable to achieve control of diabetes   Pharmacist Clinical Goal(s):  Patient will adhere to plan to optimize therapeutic regimen for diabetes as evidenced by report of adherence to recommended medication management changes through collaboration with PharmD and provider.   Interventions: 1:1 collaboration with Tonia Ghent, MD regarding development and update of comprehensive plan of care as evidenced by provider attestation and co-signature Inter-disciplinary care team collaboration (see longitudinal plan of care) Comprehensive medication review performed; medication list updated in electronic medical record   Hypertension (BP goal <130/80) -Controlled - per patient reported home log -Current home readings: ~114/80, 80 bpm (uses an arm cuff for at home monitoring) -Current treatment: Amlodipine 5 mg daily - Appropriate, Effective, Safe, Accessible Lisinopril 30 mg daily - Appropriate, Effective, Safe, Accessible HCTZ 12.5 mg daily - Appropriate, Effective, Safe, Accessible -Medications previously tried: none -Continue to monitor BP at home daily, document, and provide log at future appointments -Recommended continue current medications   Diabetes (A1c goal <7%) -Uncontrolled, improving - A1c 9.3% (07/2021) improved from >51%, started Trulicity 7/61/60 and home BG improving overall, but still struggles with dietary indiscretions and high fasting BG periodically -He was switched from Levemir to [place] a few weeks  ago but has not made the switch himself yet, he is finishing up his supply of Levemir first -Current home glucose readings: Fasting: 141-150; up to 200 at times -Current medications: Tresiba 200 u/mL 70 units daily - Appropriate, Query Effective Glipizide 5 mg - 2 AM, 1 PM -  Appropriate, Query Effective Jardiance 10 mg daily (PAP)  - Appropriate, Query Effective Trulicity 7.37 mg weekly (PAP) - Appropriate, Query Effective -Medications previously tried: metformin 500 mg - severe GI upset  -Current meal patterns: high carb diet  -Reviewed benefits of Tresiba over Levemir (more concentrated form improved absorption, longer acting formulation) -Reviewed benefits of CGM, will likely help with diet decisions, pt agreed to trial -Recommend to start Freestyle Libre 2; pt to set up training with PharmD prn -Recommend to continue current medication  Hyperlipidemia: (LDL goal < 70) -Controlled - LDL 48 (07/2021) at goal -Current treatment: Atorvastatin 80 mg daily - Appropriate, Effective, Safe, Accessible -Medications previously tried: n/a  -Educated on Cholesterol goals; Benefits of statin for ASCVD risk reduction; -Recommended to continue current medication  Patient Goals/Self-Care Activities Patient will: - take medications as prescribed - check glucose daily, document, and provide at future appointments - check blood pressure with abnormal symptoms      Patient verbalizes understanding of instructions and care plan provided today and agrees to view in [place]. Active MyChart status and patient understanding of how to access instructions and care plan via MyChart confirmed with patient.    Telephone follow up appointment with pharmacy team member scheduled for: 2 weeks  [name], PharmD, [facility] Clinical Pharmacist [facility] [phone]

## 2021-09-10 ENCOUNTER — Telehealth: Payer: Self-pay

## 2021-09-10 NOTE — Progress Notes (Signed)
Chronic Care Management Pharmacy Assistant   Name: Christian [name]  MRN: [id number] DOB: 1940-07-29  Reason for Encounter: CCM (Medication Adherence and Delivery Coordination)  Recent office visits:  None since last CCM contact'  Recent consult visits:  None since last CCM contact  Hospital visits:  None since last CCM contact'  Medications: Outpatient Encounter Medications as of 09/10/2021  Medication Sig Note   amLODipine (NORVASC) 5 MG tablet Take 1 tablet (5 mg total) by mouth daily.    atorvastatin (LIPITOR) 80 MG tablet TAKE ONE TABLET BY MOUTH EVERY EVENING    Continuous Blood Gluc Receiver (FREESTYLE LIBRE 2 READER) DEVI Use with sensors to monitor sugar continuously    Continuous Blood Gluc Sensor (FREESTYLE LIBRE 2 SENSOR) MISC Apply sensor every 14 days to monitor sugar continously    empagliflozin (JARDIANCE) 10 MG TABS tablet Take 1 tablet (10 mg total) by mouth daily before breakfast. 09/04/2021: Via [name] assistance   glipiZIDE (GLUCOTROL) 5 MG tablet TAKE TWO TABLETS BY MOUTH EVERY MORNING and TAKE ONE TABLET BY MOUTH EVERY EVENING    glucose blood (ONETOUCH ULTRA) test strip USE 1 STRIP TO CHECK GLUCOSE ONCE DAILY    hydrochlorothiazide (HYDRODIURIL) 12.5 MG tablet Take 1 tablet (12.5 mg total) by mouth daily.    insulin degludec (TRESIBA FLEXTOUCH) 200 UNIT/ML FlexTouch Pen Inject 70 Units into the skin daily.    Insulin Pen Needle (RELION PEN NEEDLE 31G/8MM) 31G X 8 MM MISC USE TO INJECT LEVEMIR DAILY    lisinopril (ZESTRIL) 30 MG tablet TAKE ONE TABLET BY MOUTH EVERY EVENING    nystatin cream (MYCOSTATIN) Apply 1 application topically 2 (two) times daily.    TRULICITY 2.42 AS/3.4HD SOPN INJECT 0.'75MG'$  (0.5ML) UNDER THE SKIN ONCE A WEEK 09/04/2021: [place] assistance   No facility-administered encounter medications on file as of 09/10/2021.   BP Readings from Last 3 Encounters:  07/21/21 130/70  04/28/21 (!) 80/50  04/21/21 110/64    Lab Results   Component Value Date   HGBA1C 9.3 (H) 07/21/2021    No OVs, Consults, or hospital visits since last care coordination call / Pharmacist visit. No medication changes indicated  Last adherence delivery date: 08/21/2021      Patient is due for next adherence delivery on: 09/19/2021  Spoke with patient on 09/10/2021 reviewed medications and coordinated delivery.  This delivery to include: Adherence Packaging   30 days  Packs: Prevagen chew 30 take 1 tablet breakfast Glipizide 5 mg - 2 tablets breakfast,1 tablet evening meal  Atorvastatin '80mg'$  take 1 tablet evening meal Amlodipine '5mg'$  take 1 tablet breakfast Hydrochlorothiazide 12.'5mg'$  take 1 tablet breakfast Lisinopril '30mg'$  take 1 tablet evening meal  Vial medications: Trueplus Pen Needles  Can not be filled until 10/02/2021 Sensors  Can not be filled until July One Touch Test Strips   Patient declined the following medications this month: Jardiance 10 mg - 1 tablet morning - gets from manufacturer Trulicity 6.22 mg  Inject once weekly  - gets from [employer] Flextouch 200 Unit - Patient will get from [facility]   Patient receives from [place] 70 units daily (patient prefers to get this from [place])  Any concerns about your medications? No  How often do you forget or accidentally miss a dose? Rarely  Do you use a pillbox? No  Is patient in packaging Yes  If yes  What is the date on your next pill pack? 09/10/2021  Any concerns or issues  with your packaging? No  No refill request needed.  Confirmed delivery date of 09/19/2021, advised patient that pharmacy will contact them the morning of delivery.   Recent readings are as follows:  05/31 glucose was 141, blood pressure 140/587 and pulse was 78.  Annual wellness visit in last year? Yes 06/24/2021 Most Recent BP reading: 130/70 on 07/21/2021   If Diabetic: Most recent A1C reading: 9.3 on 07/21/2021 Last eye exam / retinopathy screening:  Up to date Last diabetic foot exam: Up to date   [name], CPP notified   [name], [place] Assistant [phone]

## 2021-09-11 ENCOUNTER — Telehealth: Payer: Self-pay

## 2021-09-11 NOTE — Progress Notes (Signed)
    Chronic Care Management Pharmacy Assistant   Name: Christian [name]  MRN: [id number] DOB: [birth year]  Reason for Encounter: CCM (Appointment Reminder)  Medications: Outpatient Encounter Medications as of 09/11/2021  Medication Sig Note   amLODipine (NORVASC) 5 MG tablet Take 1 tablet (5 mg total) by mouth daily.    atorvastatin (LIPITOR) 80 MG tablet TAKE ONE TABLET BY MOUTH EVERY EVENING    Continuous Blood Gluc Receiver (FREESTYLE LIBRE 2 READER) DEVI Use with sensors to monitor sugar continuously    Continuous Blood Gluc Sensor (FREESTYLE LIBRE 2 SENSOR) MISC Apply sensor every 14 days to monitor sugar continously    empagliflozin (JARDIANCE) 10 MG TABS tablet Take 1 tablet (10 mg total) by mouth daily before breakfast. 09/04/2021: Via [name] assistance   glipiZIDE (GLUCOTROL) 5 MG tablet TAKE TWO TABLETS BY MOUTH EVERY MORNING and TAKE ONE TABLET BY MOUTH EVERY EVENING    glucose blood (ONETOUCH ULTRA) test strip USE 1 STRIP TO CHECK GLUCOSE ONCE DAILY    hydrochlorothiazide (HYDRODIURIL) 12.5 MG tablet Take 1 tablet (12.5 mg total) by mouth daily.    insulin degludec (TRESIBA FLEXTOUCH) 200 UNIT/ML FlexTouch Pen Inject 70 Units into the skin daily.    Insulin Pen Needle (RELION PEN NEEDLE 31G/8MM) 31G X 8 MM MISC USE TO INJECT LEVEMIR DAILY    lisinopril (ZESTRIL) 30 MG tablet TAKE ONE TABLET BY MOUTH EVERY EVENING    nystatin cream (MYCOSTATIN) Apply 1 application topically 2 (two) times daily.    TRULICITY 7.32 KG/2.5KY SOPN INJECT 0.'75MG'$  (0.5ML) UNDER THE SKIN ONCE A WEEK 09/04/2021: [place] assistance   No facility-administered encounter medications on file as of 09/11/2021.   REISS MOWREY was contacted to remind of upcoming office visit with [name] on 09/15/2021 at 9:30. Patient was reminded to have any blood glucose and blood pressure readings available for review at appointment.   Patient confirmed appointment.  Patient is coming in the office for [employer] 2 training.   CCM referral has been placed prior to visit?  No   Star Rating Drugs: Medication:  Last Fill: Day Supply Lisinopril 30 mg 08/15/2021 30 Atorvastatin 80 mg 08/15/2021 30 Glipizide 5 mg  08/15/2021 [place], CPP notified  [name], [place] Pharmacy Assistant [phone]

## 2021-09-15 ENCOUNTER — Ambulatory Visit: Payer: Medicare Other | Admitting: Pharmacist

## 2021-09-15 DIAGNOSIS — I10 Essential (primary) hypertension: Secondary | ICD-10-CM

## 2021-09-15 DIAGNOSIS — E1142 Type 2 diabetes mellitus with diabetic polyneuropathy: Secondary | ICD-10-CM

## 2021-09-15 NOTE — Progress Notes (Signed)
Chronic Care Management  Pharmacy Note  05/06/2021 Name:  Christian [name] MRN:  [id number] DOB:  June 06, 1940  Summary: CCM F/U visit Patient presented for [facility] 2 training. Patient brought sensor to office. Patient opted to use smartphone as the reader. We downloaded Freestyle Libre 2 app on his phone and set up the app. Advised pt not to turn off phone so as to not to miss potential alarms. Provided overview of how to navigate app and use several features including adjusting alarm settings, logbook and daily patterns.   Demonstrated how to apply sensor and applied sensor to L arm today. Demonstrated how to pair sensor with [employer] App, sensor was successfully paired. Advised pt how to navigate to app and scan sensor for sugar readings. Advised to scan at least every 8 hours for complete results. Advised to change sensor every 14 days or as directed by the app. Advised pt to avoid high doses of Vitamin C (>500 mg/day) and to remove sensor prior to imaging (X-ray, CT or MRI scans). Provided patient with customer service phone number in case of device malfunction: [phone].   Patient opted to connect to clinic [facility] account to share data: Yes  Pt voiced understanding of above and denies further questions.   Follow up Plan: -Pharmacist follow up televisit scheduled for 1 month -PCP F/U 11/03/21     Subjective: Christian [name] is an 81 y.o. year old male who is a primary patient of [name], [name], MD.  The CCM team was consulted for assistance with disease management and care coordination needs.    Engaged with patient by telephone for follow up visit in response to provider referral for pharmacy case management   Consent to Services:  The patient was given information about Chronic Care Management services, agreed to services, and gave verbal consent prior to initiation of services.  Please see initial visit note for detailed documentation.   Patient Care Team: Tonia Ghent, MD as PCP - General (Family Medicine) Charlton Haws, [facility] as Pharmacist (Pharmacist)  Recent office visits: 04/21/21 - Elsie Stain, MD - Pt presented for diabetes follow up. A1c increased from 9.1 to 10.5%. Foot exam normal. Rectal bleeding, refer to GI. Fungal infection, start nystatin. Follow up 3 months. 03/26/21 - CCM diabetes follow up - Pt approved for PAP (Trulicity 1.54 mg weekly and Jardiance 10 mg), Start Trulicity this week. Improve diabetes diet/exercise.  Recent consult visits: 04/28/21 - NP [place]Smith (GI) - Pt presented for rectal bleeding. Exam showed internal and external hemorrhoids without active bleeding. Use MiraLAX nightly PRN. Apply Desitin. Update CBC. BP 80/50, recheck BP came up to 110/58.   Hospital visits: None since last CCM contact  Objective:  Lab Results  Component Value Date   CREATININE 1.30 07/21/2021   BUN 27 (H) 07/21/2021   GFR 52.06 (L) 07/21/2021   GFRNONAA >60 08/22/2020   GFRAA >60 05/17/2016   NA 141 07/21/2021   K 4.5 07/21/2021   CALCIUM 9.6 07/21/2021   CO2 25 07/21/2021   GLUCOSE 145 (H) 07/21/2021    Lab Results  Component Value Date/Time   HGBA1C 9.3 (H) 07/21/2021 12:49 PM   HGBA1C 10.5 (A) 04/21/2021 10:01 AM   HGBA1C 9.1 (H) 08/08/2020 10:03 AM   GFR 52.06 (L) 07/21/2021 12:49 PM   GFR 66.20 08/08/2020 10:03 AM   MICROALBUR 1.6 01/04/2020 08:02 AM   MICROALBUR 0.7 03/07/2018 09:41 AM    Last diabetic Eye exam:  Lab Results  Component Value Date/Time   HMDIABEYEEXA No Retinopathy 08/30/2020 12:00 AM    Last diabetic Foot exam: 08/08/20  PCP diabetic foot ulcer  Lab Results  Component Value Date   CHOL 123 07/21/2021   HDL 31.90 (L) 07/21/2021   LDLDIRECT 48.0 07/21/2021   TRIG 379.0 (H) 07/21/2021   CHOLHDL 4 07/21/2021       Latest Ref Rng & Units 07/21/2021   12:49 PM 08/18/2020    1:24 PM 01/04/2020    8:02 AM  Hepatic Function  Total Protein 6.0 - 8.3 g/dL 7.0   6.7   7.3    Albumin 3.5 -  5.2 g/dL 4.2   3.3   4.2    AST 0 - 37 U/L _0 ALT 0 - 53 U/L _1 Alk Phosphatase 39 - 117 U/L 94   55   64    Total Bilirubin 0.2 - 1.2 mg/dL 1.0   0.9   0.9      Lab Results  Component Value Date/Time   TSH 3.66 07/21/2021 12:49 PM   TSH 3.337 03/06/2016 03:45 AM       Latest Ref Rng & Units 07/21/2021   12:49 PM 04/28/2021    2:21 PM 08/30/2020    3:00 PM  CBC  WBC 4.0 - 10.5 K/uL 7.7   9.4   9.2    Hemoglobin 13.0 - 17.0 g/dL 13.5   13.7   12.4    Hematocrit 39.0 - 52.0 % 41.1   41.5   38.0    Platelets 150.0 - 400.0 K/uL 205.0   213.0   222     Clinical ASCVD: No  The ASCVD Risk score (Arnett DK, et al., 2019) failed to calculate for the following reasons:   The 2019 ASCVD risk score is only valid for ages 7 to 47       06/24/2021    2:09 PM 04/21/2021   10:02 AM 04/16/2020    9:01 AM  Depression screen PHQ 2/9  Decreased Interest 0 0 0  Down, Depressed, Hopeless 0 0 0  PHQ - 2 Score 0 0 0    Social History   Tobacco Use  Smoking Status Never  Smokeless Tobacco Never   BP Readings from Last 3 Encounters:  07/21/21 130/70  04/28/21 (!) 80/50  04/21/21 110/64   Pulse Readings from Last 3 Encounters:  07/21/21 99  04/28/21 (!) 104  04/21/21 (!) 101   Wt Readings from Last 3 Encounters:  07/21/21 210 lb (95.3 kg)  06/24/21 214 lb (97.1 kg)  04/28/21 214 lb 6 oz (97.2 kg)   BMI Readings from Last 3 Encounters:  07/21/21 28.48 kg/m  06/24/21 29.02 kg/m  04/28/21 28.67 kg/m    Assessment/Interventions: Review of patient past medical history, allergies, medications, health status, including review of consultants reports, laboratory and other test data, was performed as part of comprehensive evaluation and provision of chronic care management services.   SDOH:  (Social Determinants of Health) assessments and interventions performed: No - done March 2023  SDOH Screenings   Alcohol Screen: Low Risk    Last Alcohol Screening Score  (AUDIT): 0  Depression (PHQ2-9): Low Risk    PHQ-2 Score: 0  Financial Resource Strain: Low Risk    Difficulty of Paying Living Expenses: Not hard at all  Food Insecurity: No Food Insecurity   Worried About Running Out of  Food in the Last Year: Never true   Ran Out of Food in the Last Year: Never true  Housing: Low Risk    Last Housing Risk Score: 0  Physical Activity: Inactive   Days of Exercise per Week: 0 days   Minutes of Exercise per Session: 0 min  Social Connections: Moderately Integrated   Frequency of Communication with Friends and Family: More than three times a week   Frequency of Social Gatherings with Friends and Family: More than three times a week   Attends Religious Services: More than 4 times per year   Active Member of [employer] or Organizations: No   Attends Archivist Meetings: Never   Marital Status: Married  Stress: No Stress Concern Present   Feeling of Stress : Only a little  Tobacco Use: Low Risk    Smoking Tobacco Use: Never   Smokeless Tobacco Use: Never   Passive Exposure: Not on file  Transportation Needs: No Transportation Needs   Lack of Transportation (Medical): No   Lack of Transportation (Non-Medical): No    CCM Care Plan  No Known Allergies  Medications Reviewed Today     Reviewed by Charlton Haws, [facility] (Pharmacist) on 09/04/21 at [place] List Status: <None>   Medication Order Taking? Sig Documenting Provider Last Dose Status Informant  amLODipine (NORVASC) 5 MG tablet [id number] Yes Take 1 tablet (5 mg total) by mouth daily. Tonia Ghent, MD Taking Active   atorvastatin (LIPITOR) 80 MG tablet [id number] Yes TAKE ONE TABLET BY MOUTH EVERY EVENING Tonia Ghent, MD Taking Active   empagliflozin (JARDIANCE) 10 MG TABS tablet [id number] Yes Take 1 tablet (10 mg total) by mouth daily before breakfast. Tonia Ghent, MD Taking Active            Med Note Rolan Bucco Sep 04, 2021  4:12 PM) Via [name]  assistance  glipiZIDE (GLUCOTROL) 5 MG tablet [id number] Yes TAKE TWO TABLETS BY MOUTH EVERY MORNING and TAKE ONE TABLET BY MOUTH EVERY EVENING Tonia Ghent, MD Taking Active   glucose blood [facility] ULTRA) test strip [id number] Yes USE 1 STRIP TO CHECK GLUCOSE ONCE DAILY Tonia Ghent, MD Taking Active   hydrochlorothiazide (HYDRODIURIL) 12.5 MG tablet [id number] Yes Take 1 tablet (12.5 mg total) by mouth daily. Tonia Ghent, MD Taking Active   insulin degludec [facility]) 200 UNIT/ML FlexTouch Pen [id number] Yes Inject 70 Units into the skin daily. Tonia Ghent, MD Taking Active   Insulin Pen Needle (RELION PEN NEEDLE 31G/8MM) 31G X 8 MM MISC [id number] Yes USE TO INJECT LEVEMIR DAILY Tonia Ghent, MD Taking Active   lisinopril (ZESTRIL) 30 MG tablet [id number] Yes TAKE ONE TABLET BY MOUTH EVERY EVENING Tonia Ghent, MD Taking Active   nystatin cream (MYCOSTATIN) [id number] Yes Apply 1 application topically 2 (two) times daily. Tonia Ghent, MD Taking Active   TRULICITY 1.74 BS/4.9QP [place] [facility] [id number] Yes INJECT 0.75MG (0.5ML) UNDER THE SKIN ONCE A WEEK Tonia Ghent, MD Taking Active            Med Note Rolan Bucco Sep 04, 2021  4:12 PM) Via [employer] [place] Cares assistance            Patient Active Problem List   Diagnosis Date Noted   Ankle pain 07/23/2021   BRBPR (bright red blood per rectum) 04/23/2021   Lumbar spondylosis 12/26/2020   Osteomyelitis of fifth toe of  right foot ([place])    Diabetic foot infection ([place]) 08/18/2020   Diabetic foot ulcer ([place]) 08/11/2020   Healthcare maintenance 01/10/2020   Lower back pain 09/27/2019   Thumb pain 06/25/2019   Medicare annual wellness visit, subsequent 03/12/2018   Advance care planning 03/12/2018   Shoulder pain 02/03/2017   Edema 11/02/2016   Diabetes mellitus with microalbuminuria ([place]) 06/10/2016   HLD (hyperlipidemia) 06/10/2016   DM type 2 with diabetic peripheral neuropathy ([place])    HTN  (hypertension)    Accident caused by farm tractor 01/15/2016   Colon polyps 06/24/2013    Immunization History  Administered Date(s) Administered   Fluad Quad(high Dose 65+) 01/05/2019, 01/09/2020, 04/01/2021   Influenza,inj,Quad PF,6+ Mos 02/20/2013, 01/03/2014, 02/05/2015, 01/15/2016, 03/26/2016, 02/02/2017   Influenza-Unspecified 04/13/2010, 01/11/2018   PFIZER(Purple Top)SARS-COV-2 Vaccination 05/23/2019, 06/17/2019   Pneumococcal Conjugate-13 01/06/2012, 01/03/2014   Pneumococcal Polysaccharide-23 12/19/2012   Td 05/14/2014   Zoster Recombinat (Shingrix) 10/27/2018, 01/31/2019   Zoster, Live 12/19/2014    Conditions to be addressed/monitored:  Hypertension, Hyperlipidemia, and Diabetes  Care Plan : [place]  Updates made by Charlton Haws, [place] since 09/15/2021 12:00 AM     Problem: Hypertension, Hyperlipidemia, and Diabetes   Priority: High     Long-Range Goal: Disease Management   Start Date: 07/18/2020  Expected End Date: 06/04/2022  Recent Progress: On track  Priority: High  Note:   Current Barriers: Unable to achieve control of diabetes   Pharmacist Clinical Goal(s):  Patient will adhere to plan to optimize therapeutic regimen for diabetes as evidenced by report of adherence to recommended medication management changes through collaboration with PharmD and provider.   Interventions: 1:1 collaboration with Tonia Ghent, MD regarding development and update of comprehensive plan of care as evidenced by provider attestation and co-signature Inter-disciplinary care team collaboration (see longitudinal plan of care) Comprehensive medication review performed; medication list updated in electronic medical record   Hypertension (BP goal <130/80) -Controlled - per patient reported home log -Current home readings: ~114/80, 80 bpm (uses an arm cuff for at home monitoring) -Current treatment: Amlodipine 5 mg daily - Appropriate, Effective, Safe,  Accessible Lisinopril 30 mg daily - Appropriate, Effective, Safe, Accessible HCTZ 12.5 mg daily - Appropriate, Effective, Safe, Accessible -Medications previously tried: none -Continue to monitor BP at home daily, document, and provide log at future appointments -Recommended continue current medications   Diabetes (A1c goal <7%) -Uncontrolled, improving - A1c 9.3% (07/2021) improved from >94%, started Trulicity 4/96/75 and home BG improving overall, but still struggles with dietary indiscretions and high fasting BG periodically -He was switched from Levemir to [place] a few weeks ago but has not made the switch himself yet, he is finishing up his supply of Levemir first -Current home glucose readings: Fasting: 141-150; up to 200 at times -Current medications: Tresiba 200 u/mL 70 units daily - Appropriate, Query Effective Glipizide 5 mg - 2 AM, 1 PM -  Appropriate, Query Effective Jardiance 10 mg daily (PAP)  - Appropriate, Query Effective Trulicity 9.16 mg weekly (PAP) - Appropriate, Query Effective -Medications previously tried: metformin 500 mg - severe GI upset  -Current meal patterns: high carb diet  -Reviewed benefits of Tresiba over Levemir (more concentrated form improved absorption, longer acting formulation) -Reviewed benefits of CGM, will likely help with diet decisions, pt agreed to trial -completed [employer] training (see visit summary) -Recommend to continue current medication  Hyperlipidemia: (LDL goal < 70) -Controlled - LDL 48 (07/2021) at goal -Current treatment:  Atorvastatin 80 mg daily - Appropriate, Effective, Safe, Accessible -Medications previously tried: n/a  -Educated on Cholesterol goals; Benefits of statin for ASCVD risk reduction; -Recommended to continue current medication  Patient Goals/Self-Care Activities Patient will: - take medications as prescribed - check glucose daily, document, and provide at future appointments - check blood pressure with  abnormal symptoms      Medication Assistance: [place] approved through 97/28/20 Trulicity - [employer] Cares approved through 04/12/22  Star Rating Drugs:  Atorvastatin 80 mg - PDC 95% Glipizide 5 mg - PDC 92%             Lisinopril 30 mg - PDC 92%   Medication Access: Within the past 30 days, how often has patient missed a dose of medication? 0 Is a pillbox or other method used to improve adherence? Yes  Factors that may affect medication adherence? no barriers identified Are meds synced by current pharmacy? Yes  Are meds delivered by current pharmacy? Yes  Does patient experience delays in picking up medications due to transportation concerns? No   [facility] Reviewed: Is patient disadvantaged to use [facility]?: No  Current Rx insurance plan: [facility] Name and location of Current pharmacy:  [facility] - [place], [place] - [address] [place] [address] Phone: [phone] Fax: [phone]  30-day packs (last 08/21/21) Amlodipine 2.5 mg - 2 tabs at breakfast Atorvastatin 80 mg - 1 evening meal  Glipizide 5 mg - 2 breakfast, 1 evening meal Lisinopril 30 mg - 1 evening meal  Prevagen Regular Strength - 1 at breakfast  Care Plan and Follow Up Patient Decision:  Patient agrees to Care Plan and Follow-up.  Follow Up Plan: Telephone follow up appointment with care management team member scheduled for: 3 months  [name], PharmD, BCACP Clinical Pharmacist [facility] at [facility] [phone]

## 2021-09-15 NOTE — Patient Instructions (Signed)
Visit Information  Phone number for Pharmacist: [phone]   Goals Addressed   None     Care Plan : [place]  Updates made by Charlton Haws, RPH since 09/15/2021 12:00 AM     Problem: Hypertension, Hyperlipidemia, and Diabetes   Priority: High     Long-Range Goal: Disease Management   Start Date: 07/18/2020  Expected End Date: 06/04/2022  Recent Progress: On track  Priority: High  Note:   Current Barriers: Unable to achieve control of diabetes   Pharmacist Clinical Goal(s):  Patient will adhere to plan to optimize therapeutic regimen for diabetes as evidenced by report of adherence to recommended medication management changes through collaboration with PharmD and provider.   Interventions: 1:1 collaboration with Tonia Ghent, MD regarding development and update of comprehensive plan of care as evidenced by provider attestation and co-signature Inter-disciplinary care team collaboration (see longitudinal plan of care) Comprehensive medication review performed; medication list updated in electronic medical record   Hypertension (BP goal <130/80) -Controlled - per patient reported home log -Current home readings: ~114/80, 80 bpm (uses an arm cuff for at home monitoring) -Current treatment: Amlodipine 5 mg daily - Appropriate, Effective, Safe, Accessible Lisinopril 30 mg daily - Appropriate, Effective, Safe, Accessible HCTZ 12.5 mg daily - Appropriate, Effective, Safe, Accessible -Medications previously tried: none -Continue to monitor BP at home daily, document, and provide log at future appointments -Recommended continue current medications   Diabetes (A1c goal <7%) -Uncontrolled, improving - A1c 9.3% (07/2021) improved from >03%, started Trulicity 5/46/56 and home BG improving overall, but still struggles with dietary indiscretions and high fasting BG periodically -He was switched from Levemir to [place] a few weeks ago but has not made the switch  himself yet, he is finishing up his supply of Levemir first -Current home glucose readings: Fasting: 141-150; up to 200 at times -Current medications: Tresiba 200 u/mL 70 units daily - Appropriate, Query Effective Glipizide 5 mg - 2 AM, 1 PM -  Appropriate, Query Effective Jardiance 10 mg daily (PAP)  - Appropriate, Query Effective Trulicity 8.12 mg weekly (PAP) - Appropriate, Query Effective -Medications previously tried: metformin 500 mg - severe GI upset  -Current meal patterns: high carb diet  -Reviewed benefits of Tresiba over Levemir (more concentrated form improved absorption, longer acting formulation) -Reviewed benefits of CGM, will likely help with diet decisions, pt agreed to trial -completed [employer] training (see visit summary) -Recommend to continue current medication  Hyperlipidemia: (LDL goal < 70) -Controlled - LDL 48 (07/2021) at goal -Current treatment: Atorvastatin 80 mg daily - Appropriate, Effective, Safe, Accessible -Medications previously tried: n/a  -Educated on Cholesterol goals; Benefits of statin for ASCVD risk reduction; -Recommended to continue current medication  Patient Goals/Self-Care Activities Patient will: - take medications as prescribed - check glucose daily, document, and provide at future appointments - check blood pressure with abnormal symptoms      Patient verbalizes understanding of instructions and care plan provided today and agrees to view in [place]. Active MyChart status and patient understanding of how to access instructions and care plan via MyChart confirmed with patient.    Telephone follow up appointment with pharmacy team member scheduled for: 1 month  [name], PharmD, [facility] Clinical Pharmacist [facility] at [facility] [phone]

## 2021-09-18 ENCOUNTER — Telehealth: Payer: Medicare Other

## 2021-10-01 ENCOUNTER — Telehealth: Payer: Self-pay

## 2021-10-01 NOTE — Progress Notes (Signed)
    Chronic Care Management Pharmacy Assistant   Name: Christian [name]  MRN: [id number] DOB: [birth year]  Reason for Encounter: CCM (Appointment Reminder)  Medications: Outpatient Encounter Medications as of 10/01/2021  Medication Sig Note   amLODipine (NORVASC) 5 MG tablet Take 1 tablet (5 mg total) by mouth daily.    atorvastatin (LIPITOR) 80 MG tablet TAKE ONE TABLET BY MOUTH EVERY EVENING    Continuous Blood Gluc Receiver (FREESTYLE LIBRE 2 READER) DEVI Use with sensors to monitor sugar continuously    Continuous Blood Gluc Sensor (FREESTYLE LIBRE 2 SENSOR) MISC Apply sensor every 14 days to monitor sugar continously    empagliflozin (JARDIANCE) 10 MG TABS tablet Take 1 tablet (10 mg total) by mouth daily before breakfast. 09/04/2021: Via [name] assistance   glipiZIDE (GLUCOTROL) 5 MG tablet TAKE TWO TABLETS BY MOUTH EVERY MORNING and TAKE ONE TABLET BY MOUTH EVERY EVENING    glucose blood (ONETOUCH ULTRA) test strip USE 1 STRIP TO CHECK GLUCOSE ONCE DAILY    hydrochlorothiazide (HYDRODIURIL) 12.5 MG tablet Take 1 tablet (12.5 mg total) by mouth daily.    insulin degludec (TRESIBA FLEXTOUCH) 200 UNIT/ML FlexTouch Pen Inject 70 Units into the skin daily.    Insulin Pen Needle (RELION PEN NEEDLE 31G/8MM) 31G X 8 MM MISC USE TO INJECT LEVEMIR DAILY    lisinopril (ZESTRIL) 30 MG tablet TAKE ONE TABLET BY MOUTH EVERY EVENING    nystatin cream (MYCOSTATIN) Apply 1 application topically 2 (two) times daily.    TRULICITY 0.71 QR/9.7JO SOPN INJECT 0.'75MG'$  (0.5ML) UNDER THE SKIN ONCE A WEEK 09/04/2021: [place] assistance   No facility-administered encounter medications on file as of 10/01/2021.   Christian [name] was contacted to remind of upcoming telephone visit with [name] on 10/06/2021 at 9:30. Patient was reminded to have any blood glucose and blood pressure readings available for review at appointment.   Patient confirmed appointment.  Are you having any problems with your  medications? No   Do you have any concerns you like to discuss with the pharmacist? No  Patient stated he has almost used up his Levemir medication and is now ready to switch to [place].  Patient will run out of Levemir on Monday, June 26. Patient spoke with [name] on 09/15/2021 about switching from Levemir to Tresiba 200 u/ML 70 units daily. Patient would like to pick that up himself at [place], [place].   CCM referral has been placed prior to visit?  No   Star Rating Drugs: Medication:  Last Fill: Day Supply Atorvastatin 80 mg 09/15/2021 30 Glipizide 5 mg  09/15/2021 30 Lisinopril 30 mg 09/15/2021 [place], CPP notified  [name], [place] Pharmacy Assistant [phone]

## 2021-10-06 ENCOUNTER — Ambulatory Visit: Payer: Medicare Other | Admitting: Pharmacist

## 2021-10-06 ENCOUNTER — Telehealth: Payer: Self-pay | Admitting: Pharmacist

## 2021-10-06 DIAGNOSIS — E1142 Type 2 diabetes mellitus with diabetic polyneuropathy: Secondary | ICD-10-CM

## 2021-10-06 DIAGNOSIS — I10 Essential (primary) hypertension: Secondary | ICD-10-CM

## 2021-10-06 DIAGNOSIS — E785 Hyperlipidemia, unspecified: Secondary | ICD-10-CM

## 2021-10-06 NOTE — Telephone Encounter (Signed)
Discussed DM control with patient, wearing Freestyle Libre now. Reviewed AGP report: 09/23/21 to 10/06/21  Time in range (70-180): 38% (goal > 70%)  High (>180): 62%  Low (< 70): 0% (goal < 4%)  GMI: 8.3%; Average glucose: 210   Current medications: Tresiba 200 u/mL 70 units daily  Glipizide 5 mg - 2 AM, 1 PM  Jardiance 10 mg daily (PAP)   Trulicity 0.75 mg weekly (PAP)   Recommend to increase Trulicity to 1.5 mg weekly to target high post-prandial BG. (Ok to take 0.75 mg twice weekly until it runs out). Patient gets Trulicity through PAP so Rx needs to go to [facility] pharmacy:  [facility] - [place], [place] - [address] [place] [address] Phone: [phone] Fax: [phone]

## 2021-10-07 MED ORDER — TRULICITY 1.5 MG/0.5ML ~~LOC~~ SOAJ: 1.5000 mg | SUBCUTANEOUS | 2 refills | Status: DC

## 2021-10-08 ENCOUNTER — Other Ambulatory Visit: Payer: Self-pay | Admitting: Family Medicine

## 2021-10-09 ENCOUNTER — Telehealth: Payer: Self-pay

## 2021-10-09 NOTE — Progress Notes (Signed)
Chronic Care Management Pharmacy Assistant   Name: Christian [name]  MRN: [id number] DOB: [birth year]  Reason for Encounter: CCM (Medication Adherence and Delivery Coordination)  Recent office visits:  None since last CCM contact  Recent consult visits:  None since last CCM contact  Hospital visits:  None in previous 6 months  Medications: Outpatient Encounter Medications as of 10/09/2021  Medication Sig Note   amLODipine (NORVASC) 5 MG tablet TAKE ONE TABLET BY MOUTH ONCE DAILY    atorvastatin (LIPITOR) 80 MG tablet TAKE ONE TABLET BY MOUTH EVERY EVENING    Continuous Blood Gluc Receiver (FREESTYLE LIBRE 2 READER) DEVI Use with sensors to monitor sugar continuously    Continuous Blood Gluc Sensor (FREESTYLE LIBRE 2 SENSOR) MISC Apply sensor every 14 days to monitor sugar continously    Dulaglutide (TRULICITY) 1.5 ZS/0.1UX SOPN Inject 1.5 mg into the skin once a week.    empagliflozin (JARDIANCE) 10 MG TABS tablet Take 1 tablet (10 mg total) by mouth daily before breakfast. 09/04/2021: Via [name] assistance   glipiZIDE (GLUCOTROL) 5 MG tablet TAKE TWO TABLETS BY MOUTH EVERY MORNING and TAKE ONE TABLET BY MOUTH EVERY EVENING    glucose blood (ONETOUCH ULTRA) test strip USE 1 STRIP TO CHECK GLUCOSE ONCE DAILY    hydrochlorothiazide (HYDRODIURIL) 12.5 MG tablet TAKE ONE TABLET BY MOUTH ONCE DAILY    insulin degludec (TRESIBA FLEXTOUCH) 200 UNIT/ML FlexTouch Pen Inject 70 Units into the skin daily.    Insulin Pen Needle (RELION PEN NEEDLE 31G/8MM) 31G X 8 MM MISC USE TO INJECT LEVEMIR DAILY    lisinopril (ZESTRIL) 30 MG tablet TAKE ONE TABLET BY MOUTH EVERY EVENING    nystatin cream (MYCOSTATIN) Apply 1 application topically 2 (two) times daily.    TRULICITY 3.23 FT/7.3UK SOPN INJECT 0.'75MG'$  (0.5ML) UNDER THE SKIN ONCE A WEEK 09/04/2021: [place] assistance   No facility-administered encounter medications on file as of 10/09/2021.   BP Readings from Last 3 Encounters:  07/21/21  130/70  04/28/21 (!) 80/50  04/21/21 110/64    Lab Results  Component Value Date   HGBA1C 9.3 (H) 07/21/2021    No OVs, Consults, or hospital visits since last care coordination call / Pharmacist visit. No medication changes indicated  Last adherence delivery date: 09/19/2021      Patient is due for next adherence delivery on: 10/21/2021  Spoke with patient on 10/09/2021 reviewed medications and coordinated delivery.  This delivery to include: Adherence Packaging   30 days  Packs: Prevagen chew 30 take 1 tablet breakfast Glipizide 5 mg - 2 tablets breakfast,1 tablet evening meal  Atorvastatin '80mg'$  take 1 tablet evening meal Amlodipine '5mg'$  take 1 tablet breakfast Hydrochlorothiazide 12.'5mg'$  take 1 tablet breakfast Lisinopril '30mg'$  take 1 tablet evening meal   Vial medications: One Touch Test Strips - Use once daily   Patient declined the following medications this month: Freestyle Libre 2 Sensors - Change sensors every 14 days - patient stated he still had a months worth unopened.   Patient receives from manufacturer Jardiance 10 mg - 1 tablet morning - gets from manufacturer Trulicity 0.25 mg  Inject once weekly  - gets from manufacturer   Patient receives from [place] [facility] 70 units daily (patient prefers to get this from [place]) Tyler Aas Flextouch 200 Unit - Patient will get from [facility]  Any concerns about your medications? No   How often do you forget or accidentally miss a dose? Rarely   Do you use  a pillbox? No  Is patient in packaging Yes  If yes  What is the date on your next pill pack? 10/09/2021  Any concerns or issues with your packaging? No  Refills requested from providers include: Amlodipine '5mg'$  take 1 tablet breakfast Hydrochlorothiazide 12.'5mg'$  take 1 tablet breakfast  Confirmed delivery date of 10/21/2021, advised patient that pharmacy will contact them the morning of delivery.  Recent blood glucose readings are as follows: Patient was  in [name] so he did not have readings with him.   Annual wellness visit in last year? Yes 06/24/2021 Most Recent BP reading: 130/70 on 07/21/2021   If Diabetic: Most recent A1C reading: 9.3 on 07/21/2021 Last eye exam / retinopathy screening: Up to date Last diabetic foot exam: Up to date   [name], CPP notified   [name], [place] [phone]  Cycle dispensing form sent to [name], PTM for review.

## 2021-10-15 ENCOUNTER — Other Ambulatory Visit: Payer: Self-pay | Admitting: Family Medicine

## 2021-10-21 ENCOUNTER — Telehealth: Payer: Self-pay

## 2021-10-21 NOTE — Progress Notes (Signed)
    Chronic Care Management Pharmacy Assistant   Name: Christian [name]  MRN: [id number] DOB: [birth year]  Patient called inquiring about his medication delivery time. No further action needed.   [name], CPP notified  [name], [place] Clinical Pharmacy Assistant [phone]

## 2021-11-03 ENCOUNTER — Encounter: Payer: Self-pay | Admitting: Family Medicine

## 2021-11-03 ENCOUNTER — Ambulatory Visit (INDEPENDENT_AMBULATORY_CARE_PROVIDER_SITE_OTHER): Payer: Medicare Other | Admitting: Family Medicine

## 2021-11-03 VITALS — BP 112/72 | HR 71 | Temp 97.2°F | Ht 72.0 in | Wt 215.0 lb

## 2021-11-03 DIAGNOSIS — E1142 Type 2 diabetes mellitus with diabetic polyneuropathy: Secondary | ICD-10-CM | POA: Diagnosis not present

## 2021-11-03 LAB — POCT GLYCOSYLATED HEMOGLOBIN (HGB A1C): Hemoglobin A1C: 9 % — AB (ref 4.0–5.6)

## 2021-11-03 MED ORDER — TRULICITY 3 MG/0.5ML ~~LOC~~ SOAJ: 3.0000 mg | SUBCUTANEOUS | 5 refills | Status: DC

## 2021-11-03 NOTE — Patient Instructions (Signed)
Try using '3mg'$  trulicity weekly and if any sugars below 100, then cut back on insulin by 2 units per day.  Update me as needed.   Plan on recheck in 3 months and call the food clinic about follow up for the callous/ulcer.  Take care.  Glad to see you.

## 2021-11-03 NOTE — Progress Notes (Unsigned)
Diabetes:  Using medications without difficulties:yes Hypoglycemic episodes:no Hyperglycemic episodes:no Feet problems: se exam re: callous.   Blood Sugars averaging: usually 120-170s in the AMs eye exam within last year:  d/w pt.   A1c done at OV.  D/w pt.  A1c 9.  Meds, vitals, and allergies reviewed.  ROS: Per HPI unless specifically indicated in ROS section   GEN: nad, alert and oriented HEENT: ncat NECK: supple w/o LA CV: rrr. 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   Normal DP pulses dec sensation to light touch and monofilament Nails thickened.    Callous shaved (pt consented, tolerated well w/o complication) Callous is 2.'5mg'$  across, central ulcer is 0.5cm across.   He felt better after having the callus shaved down.  38 minutes were devoted to patient care in this encounter (this includes time spent reviewing the patient's file/history, interviewing and examining the patient, counseling/reviewing plan with patient).

## 2021-11-05 ENCOUNTER — Telehealth: Payer: Self-pay

## 2021-11-05 NOTE — Progress Notes (Unsigned)
    Chronic Care Management Pharmacy Assistant   Name: Christian [name]  MRN: [id number] DOB: 1940-09-12  Reason for Encounter: CCM (Appointment Reminder)  Medications: Outpatient Encounter Medications as of 11/05/2021  Medication Sig   amLODipine (NORVASC) 5 MG tablet TAKE ONE TABLET BY MOUTH ONCE DAILY   atorvastatin (LIPITOR) 80 MG tablet TAKE ONE TABLET BY MOUTH EVERY EVENING   Continuous Blood Gluc Receiver (FREESTYLE LIBRE 2 READER) DEVI Use with sensors to monitor sugar continuously   Continuous Blood Gluc Sensor (FREESTYLE LIBRE 2 SENSOR) MISC Apply sensor every 14 days to monitor sugar continously   Dulaglutide (TRULICITY) 3 IZ/1.2WP SOPN Inject 3 mg as directed once a week.   empagliflozin (JARDIANCE) 10 MG TABS tablet Take 1 tablet (10 mg total) by mouth daily before breakfast.   glipiZIDE (GLUCOTROL) 5 MG tablet TAKE TWO TABLETS BY MOUTH EVERY MORNING and TAKE ONE TABLET BY MOUTH EVERY EVENING   glucose blood (ONETOUCH ULTRA) test strip USE TO check blood glucose daily AS DIRECTED   hydrochlorothiazide (HYDRODIURIL) 12.5 MG tablet TAKE ONE TABLET BY MOUTH ONCE DAILY   insulin degludec (TRESIBA FLEXTOUCH) 200 UNIT/ML FlexTouch Pen Inject 70 Units into the skin daily.   Insulin Pen Needle (RELION PEN NEEDLE 31G/8MM) 31G X 8 MM MISC USE TO INJECT LEVEMIR DAILY   lisinopril (ZESTRIL) 30 MG tablet TAKE ONE TABLET BY MOUTH EVERY EVENING   No facility-administered encounter medications on file as of 11/05/2021.   Christian [name] was contacted to remind of upcoming telephone visit with [name]  on 11/11/2021 at 9:30. Patient was reminded to have any blood glucose and blood pressure readings available for review at appointment. Patient was rescheduled to 12/09/2021 for a DM follow up.   Patient confirmed appointment.   Are you having any problems with your medications? No   Do you have any concerns you like to discuss with the pharmacist? No - Patient stated Dr. [name] increased  his Trulicity to 3 mg yesterday (11/05/21) as patient's glucose readings were high. I rescheduled patient's appointment with Mendel Ryder to 12/09/2021 for a DM follow up. Patient will have glucose log available for appointment. Patient was advised to contact me if any concerns or questions arise in the meantime.  Patient expressed understanding.    CCM referral has been placed prior to visit?  No    Star Rating Drugs: Medication:  Last Fill: Day Supply Atorvastatin 80 mg 80/99/8338 30 Trulicity 3 mg   Glipizide 5 mg 10/15/2021 30 Lisinopril 30 mg 10/15/2021 [place], CPP notified  [name], [place] Pharmacy Assistant [phone]

## 2021-11-05 NOTE — Assessment & Plan Note (Signed)
With ulcerated callus as described above.  Callus shaved down after getting consent. Increase to '3mg'$  trulicity weekly and if any sugars below 100, then cut back on insulin by 2 units per day.  Update me as needed.   Plan on recheck in 3 months and he can call the food clinic about follow up for the callous/ulcer.

## 2021-11-06 ENCOUNTER — Other Ambulatory Visit: Payer: Self-pay | Admitting: Family Medicine

## 2021-11-07 ENCOUNTER — Telehealth: Payer: Self-pay

## 2021-11-07 NOTE — Progress Notes (Signed)
Chronic Care Management Pharmacy Assistant   Name: Christian [name]  MRN: [id number] DOB: 06/12/1940  Reason for Encounter: CCM (Medication Adherence and Delivery Coordination)   Recent office visits:  11/03/21 Elsie Stain, MD DM A1C: 9.0 Change: Trulicity 3 mg Stop (patient): nystatin cream Procedure: Ulcerated callus shaved down. FU 3 months.   Recent consult visits:  None since last CCM contact  Hospital visits:  None in previous 6 months  Medications: Outpatient Encounter Medications as of 11/07/2021  Medication Sig   amLODipine (NORVASC) 5 MG tablet TAKE ONE TABLET BY MOUTH ONCE DAILY   atorvastatin (LIPITOR) 80 MG tablet TAKE ONE TABLET BY MOUTH EVERY EVENING   Continuous Blood Gluc Receiver (FREESTYLE LIBRE 2 READER) DEVI Use with sensors to monitor sugar continuously   Continuous Blood Gluc Sensor (FREESTYLE LIBRE 2 SENSOR) MISC Apply sensor every 14 days to monitor sugar continously   Dulaglutide (TRULICITY) 3 IZ/1.2WP SOPN Inject 3 mg as directed once a week.   empagliflozin (JARDIANCE) 10 MG TABS tablet Take 1 tablet (10 mg total) by mouth daily before breakfast.   glipiZIDE (GLUCOTROL) 5 MG tablet TAKE TWO TABLETS BY MOUTH EVERY MORNING and TAKE ONE TABLET BY MOUTH EVERY EVENING   glucose blood (ONETOUCH ULTRA) test strip USE TO check blood glucose daily AS DIRECTED   hydrochlorothiazide (HYDRODIURIL) 12.5 MG tablet TAKE ONE TABLET BY MOUTH ONCE DAILY   insulin degludec (TRESIBA FLEXTOUCH) 200 UNIT/ML FlexTouch Pen Inject 70 Units into the skin daily.   Insulin Pen Needle (RELION PEN NEEDLE 31G/8MM) 31G X 8 MM MISC USE TO INJECT LEVEMIR DAILY   lisinopril (ZESTRIL) 30 MG tablet TAKE ONE TABLET BY MOUTH EVERY EVENING   No facility-administered encounter medications on file as of 11/07/2021.   BP Readings from Last 3 Encounters:  11/03/21 112/72  07/21/21 130/70  04/28/21 (!) 80/50    Lab Results  Component Value Date   HGBA1C 9.0 (A) 11/03/2021     Recent OV,  Consult or Hospital visit:  Recent medication changes indicated:   Change: Trulicity 3 mg   Stop (patient): nystatin cream  Last adherence delivery date: 10/21/2021      Patient is due for next adherence delivery on: 11/19/2021  Spoke with patient on 11/07/2021 reviewed medications and coordinated delivery.  This delivery to include: Adherence Packaging   30 days  Packs: Prevagen chew 30 take 1 tablet breakfast Glipizide 5 mg - 2 tablets breakfast,1 tablet evening meal  Atorvastatin '80mg'$  take 1 tablet evening meal Amlodipine '5mg'$  take 1 tablet breakfast Hydrochlorothiazide 12.'5mg'$  take 1 tablet breakfast Lisinopril 30 mg take 1 tablet evening meal   Vial medications: None needed  Not due until September: Insulin Pen Needle (RELION PEN NEEDLE 31G/8MM) 31G X 8 MM - probably won't even need in September and could probably make it through the rest of the year.   Not due until October: One Touch Test Strips - Use once daily   Patient declined the following medications this month: Freestyle Libre 2 Sensors - Change sensors every 14 days - patient stated he still had a months worth unopened. Will need in September   Patient receives from manufacturer Jardiance 10 mg - 1 tablet morning - gets from manufacturer Trulicity 8.09 mg  Inject once weekly  - gets from manufacturer   Patient receives from [place] 70 units daily (patient prefers to get this from [place]) [employer] Aas Flextouch 200 Unit - Patient will get from [facility]   Any concerns  about your medications? No   How often do you forget or accidentally miss a dose? Rarely   Do you use a pillbox? No  Is patient in packaging Yes  If yes  What is the date on your next pill pack? 11/07/2021   Any concerns or issues with your packaging? No  Refills requested from providers include: Completed Lisinopril '30mg'$  take 1 tablet evening meal  Confirmed delivery date of 11/19/21, advised patient that pharmacy will  contact them the morning of delivery.   Recent blood glucose readings are as follows: Patient did not have any readings.   Annual wellness visit in last year?  Yes 06/24/2021 Most Recent BP reading: 112/72 on 11/03/2021  If Diabetic: Most recent A1C reading: 9.0 on 11/03/21 Last eye exam / retinopathy screening: 08/30/20 Last diabetic foot exam: Up to date  Cycle dispensing form sent to [name], PTM for review.

## 2021-11-11 ENCOUNTER — Telehealth: Payer: Medicare Other

## 2021-11-12 ENCOUNTER — Ambulatory Visit: Payer: Medicare Other | Admitting: Podiatry

## 2021-11-12 ENCOUNTER — Ambulatory Visit (INDEPENDENT_AMBULATORY_CARE_PROVIDER_SITE_OTHER): Payer: Medicare Other

## 2021-11-12 ENCOUNTER — Telehealth: Payer: Self-pay | Admitting: Family Medicine

## 2021-11-12 DIAGNOSIS — Z9189 Other specified personal risk factors, not elsewhere classified: Secondary | ICD-10-CM

## 2021-11-12 DIAGNOSIS — E11621 Type 2 diabetes mellitus with foot ulcer: Secondary | ICD-10-CM | POA: Diagnosis not present

## 2021-11-12 DIAGNOSIS — E1142 Type 2 diabetes mellitus with diabetic polyneuropathy: Secondary | ICD-10-CM | POA: Diagnosis not present

## 2021-11-12 DIAGNOSIS — L97411 Non-pressure chronic ulcer of right heel and midfoot limited to breakdown of skin: Secondary | ICD-10-CM

## 2021-11-12 NOTE — Progress Notes (Signed)
Subjective:  Patient ID: Christian [name], male    DOB: 01-29-1941,  MRN: [id number]  Chief Complaint  Patient presents with   Foot Problem    right foot possible tumor near the ball of foot    81 y.o. male presents with the above complaint.  Patient presents with superficial ulceration to the right submetatarsal 1.  Patient states this started causing some issues that started just breaking down.  He is being managed by Dr. Damita [name].  He wanted to get it evaluated make sure that there is nothing else going on.  He is a diabetic with last A1c of 9%.  He is working on bringing it down so he can get surgical options.  He denies any other acute complaints.   Review of Systems: Negative except as noted in the HPI. Denies N/V/F/Ch.  Past Medical History:  Diagnosis Date   Arthritis    Blood transfusion without reported diagnosis    ? in 2017 after crushing injury    Cataract    right eye removed    Crushing injury of pelvic region    Diabetes mellitus without complication ([place])    Hyperlipidemia    Hypertension     Current Outpatient Medications:    amLODipine (NORVASC) 5 MG tablet, TAKE ONE TABLET BY MOUTH ONCE DAILY, Disp: 180 tablet, Rfl: 2   atorvastatin (LIPITOR) 80 MG tablet, TAKE ONE TABLET BY MOUTH EVERY EVENING, Disp: 90 tablet, Rfl: 2   Continuous Blood Gluc Receiver (FREESTYLE LIBRE 2 READER) DEVI, Use with sensors to monitor sugar continuously, Disp: 1 each, Rfl: 0   Continuous Blood Gluc Sensor (FREESTYLE LIBRE 2 SENSOR) MISC, Apply sensor every 14 days to monitor sugar continously, Disp: 2 each, Rfl: 5   Dulaglutide (TRULICITY) 3 XH/3.7JI SOPN, Inject 3 mg as directed once a week., Disp: 2 mL, Rfl: 5   empagliflozin (JARDIANCE) 10 MG TABS tablet, Take 1 tablet (10 mg total) by mouth daily before breakfast., Disp: 90 tablet, Rfl: 3   glipiZIDE (GLUCOTROL) 5 MG tablet, TAKE TWO TABLETS BY MOUTH EVERY MORNING and TAKE ONE TABLET BY MOUTH EVERY EVENING, Disp: 270 tablet, Rfl: 2    glucose blood (ONETOUCH ULTRA) test strip, USE TO check blood glucose daily AS DIRECTED, Disp: 100 strip, Rfl: 3   hydrochlorothiazide (HYDRODIURIL) 12.5 MG tablet, TAKE ONE TABLET BY MOUTH ONCE DAILY, Disp: 90 tablet, Rfl: 2   insulin degludec (TRESIBA FLEXTOUCH) 200 UNIT/ML FlexTouch Pen, Inject 70 Units into the skin daily., Disp: 9 mL, Rfl: 3   Insulin Pen Needle (RELION PEN NEEDLE 31G/8MM) 31G X 8 MM MISC, USE TO INJECT LEVEMIR DAILY, Disp: 100 each, Rfl: 3   lisinopril (ZESTRIL) 30 MG tablet, TAKE ONE TABLET BY MOUTH EVERY EVENING, Disp: 30 tablet, Rfl: 2  Social History   Tobacco Use  Smoking Status Never  Smokeless Tobacco Never    No Known Allergies Objective:  There were no vitals filed for this visit. There is no height or weight on file to calculate BMI. Constitutional Well developed. Well nourished.  Vascular Dorsalis pedis pulses palpable bilaterally. Posterior tibial pulses palpable bilaterally. Capillary refill normal to all digits.  No cyanosis or clubbing noted. Pedal hair growth normal.  Neurologic Normal speech. Oriented to person, place, and time. Epicritic sensation to light touch grossly present bilaterally.  Dermatologic Right plantarflexed first metatarsal with submetatarsal 1 limited to the breakdown of the skin.  Granular wound bed noted no probing noted bone noted no erythema noted no malodor present.  Orthopedic: Normal joint ROM without pain or crepitus bilaterally. No visible deformities. No bony tenderness.   Radiographs: None Assessment:   1. At risk for foot problem   2. DM type 2 with diabetic peripheral neuropathy ([place])   3. Diabetic ulcer of right midfoot associated with type 2 diabetes mellitus, limited to breakdown of skin ([place])    Plan:  Patient was evaluated and treated and all questions answered.  Right foot ulceration limited to the breakdown of the skin to submetatarsal 1 -All questions and concerns were discussed with the  patient in extensive detail.  The wound is being primarily managed by Dr. Damita [name].  I encouraged him to keep it covered with triple antibiotic/Neosporin and a Band-Aid.  Minimal debridement was carried out as the wound is very granular in nature.  I encouraged offloading as well.  Discussed shoe gear modification as well.  Ultimately once his A1c is down we can discuss surgical options as well.  No follow-ups on file.

## 2021-11-12 NOTE — Telephone Encounter (Signed)
Notify patient.  I saw his podiatry note.  If his foot does not continue to heal and eventually heal over then I want to see him back in the meantime to recheck his foot.  We may have to shave the callus down again.  Thanks.

## 2021-11-12 NOTE — Telephone Encounter (Signed)
Called and spoke with patient and advised to make an appt if foot does not get better. Patient states he will call if he needs to.

## 2021-12-04 ENCOUNTER — Telehealth: Payer: Self-pay

## 2021-12-04 NOTE — Progress Notes (Signed)
    Chronic Care Management Pharmacy Assistant   Name: Christian [name]  MRN: [id number] DOB: 1940/05/19  Reason for Encounter: CCM (Appointment Reminder)  Medications: Outpatient Encounter Medications as of 12/04/2021  Medication Sig   amLODipine (NORVASC) 5 MG tablet TAKE ONE TABLET BY MOUTH ONCE DAILY   atorvastatin (LIPITOR) 80 MG tablet TAKE ONE TABLET BY MOUTH EVERY EVENING   Continuous Blood Gluc Receiver (FREESTYLE LIBRE 2 READER) DEVI Use with sensors to monitor sugar continuously   Continuous Blood Gluc Sensor (FREESTYLE LIBRE 2 SENSOR) MISC Apply sensor every 14 days to monitor sugar continously   Dulaglutide (TRULICITY) 3 DX/8.3JA SOPN Inject 3 mg as directed once a week.   empagliflozin (JARDIANCE) 10 MG TABS tablet Take 1 tablet (10 mg total) by mouth daily before breakfast.   glipiZIDE (GLUCOTROL) 5 MG tablet TAKE TWO TABLETS BY MOUTH EVERY MORNING and TAKE ONE TABLET BY MOUTH EVERY EVENING   glucose blood (ONETOUCH ULTRA) test strip USE TO check blood glucose daily AS DIRECTED   hydrochlorothiazide (HYDRODIURIL) 12.5 MG tablet TAKE ONE TABLET BY MOUTH ONCE DAILY   insulin degludec (TRESIBA FLEXTOUCH) 200 UNIT/ML FlexTouch Pen Inject 70 Units into the skin daily.   Insulin Pen Needle (RELION PEN NEEDLE 31G/8MM) 31G X 8 MM MISC USE TO INJECT LEVEMIR DAILY   lisinopril (ZESTRIL) 30 MG tablet TAKE ONE TABLET BY MOUTH EVERY EVENING   No facility-administered encounter medications on file as of 12/04/2021.   [name] was contacted to remind of upcoming telephone visit with [name] on 12/09/21 at 8:45. Patient was reminded to have any blood glucose and blood pressure readings available for review at appointment.   Patient confirmed appointment.  Are you having any problems with your medications? No   Do you have any concerns you like to discuss with the pharmacist? No  CCM referral has been placed prior to visit?  No   Star Rating Drugs: Medication:  Last  Fill: Day Supply Atorvastatin 80 mg   25/08/3974      30 Trulicity 3 mg             Discontinued 11/03/21 due to GI issues Glipizide 5 mg           11/17/2021      30 Lisinopril 30 mg        11/17/2021      [place], CPP notified  [name], [place] Pharmacy Assistant [phone]

## 2021-12-05 ENCOUNTER — Telehealth: Payer: Self-pay

## 2021-12-05 NOTE — Progress Notes (Signed)
Patient has been made aware. Patient is going to call [employer] support.  [name], CPP notified  [name], [place] Clinical Pharmacy Assistant [phone]

## 2021-12-05 NOTE — Telephone Encounter (Signed)
Patient states that his meter keeps giving him an error reading any time he checks his blood sugar, wanting to know what he should do. Aware he is eligible for a new one starting Monday but doesn't know what to do why he waits for the new device.

## 2021-12-09 ENCOUNTER — Ambulatory Visit: Payer: Medicare Other | Admitting: Pharmacist

## 2021-12-09 DIAGNOSIS — I10 Essential (primary) hypertension: Secondary | ICD-10-CM

## 2021-12-09 DIAGNOSIS — E1142 Type 2 diabetes mellitus with diabetic polyneuropathy: Secondary | ICD-10-CM

## 2021-12-09 DIAGNOSIS — E785 Hyperlipidemia, unspecified: Secondary | ICD-10-CM

## 2021-12-09 NOTE — Patient Instructions (Signed)
Visit Information  Phone number for Pharmacist: [phone]   Goals Addressed   None     Care Plan : [place]  Updates made by Charlton Haws, [place] since 12/09/2021 12:00 AM     Problem: Hypertension, Hyperlipidemia, and Diabetes   Priority: High     Long-Range Goal: Disease Management   Start Date: 07/18/2020  Expected End Date: 06/04/2022  Recent Progress: On track  Priority: High  Note:   Current Barriers: Unable to achieve control of diabetes   Pharmacist Clinical Goal(s):  Patient will adhere to plan to optimize therapeutic regimen for diabetes as evidenced by report of adherence to recommended medication management changes through collaboration with PharmD and provider.   Interventions: 1:1 collaboration with Tonia Ghent, MD regarding development and update of comprehensive plan of care as evidenced by provider attestation and co-signature Inter-disciplinary care team collaboration (see longitudinal plan of care) Comprehensive medication review performed; medication list updated in electronic medical record   Hypertension (BP goal <130/80) -Controlled - per patient reported home log -Current home readings: ~114/80, 80 bpm (uses an arm cuff for at home monitoring) -Current treatment: Amlodipine 5 mg daily - Appropriate, Effective, Safe, Accessible Lisinopril 30 mg daily - Appropriate, Effective, Safe, Accessible HCTZ 12.5 mg daily - Appropriate, Effective, Safe, Accessible -Medications previously tried: none -Continue to monitor BP at home daily, document, and provide log at future appointments -Recommended continue current medications   Diabetes (A1c goal <7%) -Uncontrolled, improving - A1c 9.0% (10/2021) improved from >84%, started Trulicity 1/32/44 and home BG improving overall, but still struggles with dietary indiscretions -Pt took Trulicity 1.5 + 0.10 mg yesterday, he has not yet taken the 3 mg dose (he did receive 3 mg dose but has excess  supply of previous doses so he is using those up first) Reviewed AGP report: 11/22/21 to 12/05/21. Sensor active: 83%  Time in range (70-180): 54% (goal > 70%)  High (>180): 46%  Low (< 70): 0% (goal < 4%)  GMI: 7.7%; Average glucose: 182  Previous AGP report: 09/23/21 to 10/06/21  Time in range (70-180): 38% (goal > 70%)  High (181-250): 62%  Low (< 70): 0% (goal < 4%)  GMI: 8.3%; Average glucose: 210  -Current medications: Tresiba 200 u/mL 70 units daily - Appropriate, Query Effective Glipizide 5 mg - 2 AM, 1 PM -  Appropriate, Query Effective Jardiance 10 mg daily (PAP)  - Appropriate, Query Effective Trulicity 3 mg weekly (Mon) (PAP) - Appropriate, Query Effective -Medications previously tried: metformin 500 mg (GI), Levemir -Current meal patterns: high carb diet  -Reviewed AGP report - TIR has improved, sugars remain elevated throughout most of the day; suspect higher dose of Trulicity will help with this, he has not yet titrated up to 3 mg -Advised to use Trulicity 1.5 mg x 2 each week until supply runs out, then use 3 mg pens  Hyperlipidemia: (LDL goal < 70) -Controlled - LDL 48 (07/2021) at goal -Current treatment: Atorvastatin 80 mg daily - Appropriate, Effective, Safe, Accessible -Medications previously tried: n/a  -Educated on Cholesterol goals; Benefits of statin for ASCVD risk reduction; -Recommended to continue current medication  Patient Goals/Self-Care Activities Patient will: - take medications as prescribed - check glucose daily, document, and provide at future appointments - check blood pressure with abnormal symptoms      Patient verbalizes understanding of instructions and care plan provided today and agrees to view in [place]. Active MyChart status and patient understanding of how to  access instructions and care plan via MyChart confirmed with patient.    Telephone follow up appointment with pharmacy team member scheduled for: 4 weeks  [name],  PharmD, BCACP Clinical Pharmacist [facility] at [facility] [phone]

## 2021-12-09 NOTE — Progress Notes (Signed)
Chronic Care Management  Pharmacy Note  12/09/21 Name:  Christian [name] MRN:  [id number] DOB:  1940/12/19  Summary: CCM F/U visit -DM: A1c 9.0% (10/2021) slowly improving from >10% previously, using Freestyle Libre now and GMI: 7.7%; Average glucose: 182, sugar is > 180 46% of the time; He has not titrated up to Trulicity 3 mg yet due to excess supply of lower doses (he took 1.5 + 0.75 mg this week)   Recommendations:  -For Trulicity: Advised to take 2 x 1.5 mg pens each week until supply of 1.5 mg pens runs out, then use 3 mg pens   Follow up: -Pharmacist follow up televisit scheduled for 4 weeks -PCP F/U 02/03/22   Subjective: Christian [name] is an 81 y.o. year old male who is a primary patient of [name], [name], MD.  The CCM team was consulted for assistance with disease management and care coordination needs.    Engaged with patient by telephone for follow up visit in response to provider referral for pharmacy case management   Consent to Services:  The patient was given information about Chronic Care Management services, agreed to services, and gave verbal consent prior to initiation of services.  Please see initial visit note for detailed documentation.   Patient Care Team: Tonia Ghent, MD as PCP - General (Family Medicine) Charlton Haws, [facility] as Pharmacist (Pharmacist)  Recent office visits: 11/03/21 Dr [name] OV: f/u DM. A1c 9.0%; Increase Trulicity to 3 mg. F/u 3 months.  04/21/21 - Elsie Stain, MD - Pt presented for diabetes follow up. A1c increased from 9.1 to 10.5%. Foot exam normal. Rectal bleeding, refer to GI. Fungal infection, start nystatin. Follow up 3 months.  03/26/21 - CCM diabetes follow up - Pt approved for PAP (Trulicity 6.75 mg weekly and Jardiance 10 mg), Start Trulicity this week. Improve diabetes diet/exercise.  Recent consult visits: 04/28/21 - NP Kennedy[place]Smith (GI) - Pt presented for rectal bleeding. Exam showed internal and external  hemorrhoids without active bleeding. Use MiraLAX nightly PRN. Apply Desitin. Update CBC. BP 80/50, recheck BP came up to 110/58.   Hospital visits: None since last CCM contact  Objective:  Lab Results  Component Value Date   CREATININE 1.30 07/21/2021   BUN 27 (H) 07/21/2021   GFR 52.06 (L) 07/21/2021   GFRNONAA >60 08/22/2020   GFRAA >60 05/17/2016   NA 141 07/21/2021   K 4.5 07/21/2021   CALCIUM 9.6 07/21/2021   CO2 25 07/21/2021   GLUCOSE 145 (H) 07/21/2021    Lab Results  Component Value Date/Time   HGBA1C 9.0 (A) 11/03/2021 08:47 AM   HGBA1C 9.3 (H) 07/21/2021 12:49 PM   HGBA1C 10.5 (A) 04/21/2021 10:01 AM   HGBA1C 9.1 (H) 08/08/2020 10:03 AM   GFR 52.06 (L) 07/21/2021 12:49 PM   GFR 66.20 08/08/2020 10:03 AM   MICROALBUR 1.6 01/04/2020 08:02 AM   MICROALBUR 0.7 03/07/2018 09:41 AM    Last diabetic Eye exam:  Lab Results  Component Value Date/Time   HMDIABEYEEXA No Retinopathy 08/30/2020 12:00 AM    Last diabetic Foot exam: 08/08/20  PCP diabetic foot ulcer  Lab Results  Component Value Date   CHOL 123 07/21/2021   HDL 31.90 (L) 07/21/2021   LDLDIRECT 48.0 07/21/2021   TRIG 379.0 (H) 07/21/2021   CHOLHDL 4 07/21/2021       Latest Ref Rng & Units 07/21/2021   12:49 PM 08/18/2020    1:24 PM 01/04/2020    8:02 AM  Hepatic Function  Total Protein 6.0 - 8.3 g/dL 7.0  6.7  7.3   Albumin 3.5 - 5.2 g/dL 4.2  3.3  4.2   AST 0 - 37 U/L '14  23  26   ' ALT 0 - 53 U/L '11  18  19   ' Alk Phosphatase 39 - 117 U/L 94  55  64   Total Bilirubin 0.2 - 1.2 mg/dL 1.0  0.9  0.9     Lab Results  Component Value Date/Time   TSH 3.66 07/21/2021 12:49 PM   TSH 3.337 03/06/2016 03:45 AM       Latest Ref Rng & Units 07/21/2021   12:49 PM 04/28/2021    2:21 PM 08/30/2020    3:00 PM  CBC  WBC 4.0 - 10.5 K/uL 7.7  9.4  9.2   Hemoglobin 13.0 - 17.0 g/dL 13.5  13.7  12.4   Hematocrit 39.0 - 52.0 % 41.1  41.5  38.0   Platelets 150.0 - 400.0 K/uL 205.0  213.0  222    Clinical  ASCVD: No  The ASCVD Risk score (Arnett DK, et al., 2019) failed to calculate for the following reasons:   The 2019 ASCVD risk score is only valid for ages 38 to 37       06/24/2021    2:09 PM 04/21/2021   10:02 AM 04/16/2020    9:01 AM  Depression screen PHQ 2/9  Decreased Interest 0 0 0  Down, Depressed, Hopeless 0 0 0  PHQ - 2 Score 0 0 0    Social History   Tobacco Use  Smoking Status Never  Smokeless Tobacco Never   BP Readings from Last 3 Encounters:  11/03/21 112/72  07/21/21 130/70  04/28/21 (!) 80/50   Pulse Readings from Last 3 Encounters:  11/03/21 71  07/21/21 99  04/28/21 (!) 104   Wt Readings from Last 3 Encounters:  11/03/21 215 lb (97.5 kg)  07/21/21 210 lb (95.3 kg)  06/24/21 214 lb (97.1 kg)   BMI Readings from Last 3 Encounters:  11/03/21 29.16 kg/m  07/21/21 28.48 kg/m  06/24/21 29.02 kg/m    Assessment/Interventions: Review of patient past medical history, allergies, medications, health status, including review of consultants reports, laboratory and other test data, was performed as part of comprehensive evaluation and provision of chronic care management services.   SDOH:  (Social Determinants of Health) assessments and interventions performed: No - done March 2023  SDOH Screenings   Alcohol Screen: Low Risk  (06/24/2021)   Alcohol Screen    Last Alcohol Screening Score (AUDIT): 0  Depression (PHQ2-9): Low Risk  (06/24/2021)   Depression (PHQ2-9)    PHQ-2 Score: 0  Financial Resource Strain: Low Risk  (06/24/2021)   Overall Financial Resource Strain (CARDIA)    Difficulty of Paying Living Expenses: Not hard at all  Food Insecurity: No Food Insecurity (06/24/2021)   Hunger Vital Sign    Worried About Running Out of Food in the Last Year: Never true    Ran Out of Food in the Last Year: Never true  Housing: Low Risk  (06/24/2021)   Housing    Last Housing Risk Score: 0  Physical Activity: Inactive (06/24/2021)   Exercise Vital Sign    Days  of Exercise per Week: 0 days    Minutes of Exercise per Session: 0 min  Social Connections: Moderately Integrated (06/24/2021)   Social Connection and Isolation Panel [NHANES]    Frequency of Communication with Friends and Family: More  than three times a week    Frequency of Social Gatherings with Friends and Family: More than three times a week    Attends Religious Services: More than 4 times per year    Active Member of Clubs or Organizations: No    Attends Archivist Meetings: Never    Marital Status: Married  Stress: No Stress Concern Present (06/24/2021)   [place]    Feeling of Stress : Only a little  Tobacco Use: Low Risk  (11/03/2021)   Patient History    Smoking Tobacco Use: Never    Smokeless Tobacco Use: Never    Passive Exposure: Not on file  Transportation Needs: No Transportation Needs (06/24/2021)   PRAPARE - Transportation    Lack of Transportation (Medical): No    Lack of Transportation (Non-Medical): No    CCM Care Plan  No Known Allergies  Medications Reviewed Today     Reviewed by Charlton Haws, [facility] (Pharmacist) on 12/09/21 at [phone]  Med List Status: <None>   Medication Order Taking? Sig Documenting Provider Last Dose Status Informant  amLODipine (NORVASC) 5 MG tablet [id number] Yes TAKE ONE TABLET BY MOUTH ONCE DAILY Tonia Ghent, MD Taking Active   atorvastatin (LIPITOR) 80 MG tablet [id number] Yes TAKE ONE TABLET BY MOUTH EVERY EVENING Tonia Ghent, MD Taking Active   Continuous Blood Gluc Receiver (FREESTYLE LIBRE 2 READER) [place] [id number] Yes Use with sensors to monitor sugar continuously Tonia Ghent, MD Taking Active   Continuous Blood Gluc Sensor (FREESTYLE [place] 2 SENSOR) [place] [id number] Yes Apply sensor every 14 days to monitor sugar continously Tonia Ghent, MD Taking Active   Dulaglutide (TRULICITY) 3 OV/7.8HY [place] [id number] Yes Inject 3 mg as directed once  a week. Tonia Ghent, MD Taking Active            Med Note Lenord Fellers, [place]   Tue Dec 09, 2021  9:48 AM) Taking 2x 1.5 mg until supply runs out, then will use 3 mg pens  empagliflozin (JARDIANCE) 10 MG TABS tablet [id number] Yes Take 1 tablet (10 mg total) by mouth daily before breakfast. Tonia Ghent, MD Taking Active            Med Note Earna Coder Nov 03, 2021  8:41 AM)    glipiZIDE (GLUCOTROL) 5 MG tablet [id number] Yes TAKE TWO TABLETS BY MOUTH EVERY MORNING and TAKE ONE TABLET BY MOUTH EVERY EVENING Tonia Ghent, MD Taking Active   glucose blood (ONETOUCH ULTRA) test strip [id number] Yes USE TO check blood glucose daily AS DIRECTED Tonia Ghent, MD Taking Active   hydrochlorothiazide (HYDRODIURIL) 12.5 MG tablet [id number] Yes TAKE ONE TABLET BY MOUTH ONCE DAILY Tonia Ghent, MD Taking Active   insulin degludec (TRESIBA FLEXTOUCH) 200 UNIT/ML FlexTouch Pen [id number] Yes Inject 70 Units into the skin daily. Tonia Ghent, MD Taking Active   Insulin Pen Needle (RELION PEN NEEDLE 31G/8MM) 31G X 8 MM MISC [id number] Yes USE TO INJECT LEVEMIR DAILY Tonia Ghent, MD Taking Active   lisinopril (ZESTRIL) 30 MG tablet [id number] Yes TAKE ONE TABLET BY MOUTH EVERY EVENING Tonia Ghent, MD Taking Active             Patient Active Problem List   Diagnosis Date Noted   Ankle pain 07/23/2021   BRBPR (bright red blood per rectum) 04/23/2021   Lumbar spondylosis 12/26/2020   Osteomyelitis of  fifth toe of right foot ([place])    Diabetic foot infection ([place]) 08/18/2020   Diabetic foot ulcer ([place]) 08/11/2020   Healthcare maintenance 01/10/2020   Lower back pain 09/27/2019   Thumb pain 06/25/2019   Medicare annual wellness visit, subsequent 03/12/2018   Advance care planning 03/12/2018   Shoulder pain 02/03/2017   Edema 11/02/2016   Diabetes mellitus with microalbuminuria ([place]) 06/10/2016   HLD (hyperlipidemia) 06/10/2016   DM type 2 with diabetic  peripheral neuropathy ([place])    HTN (hypertension)    Accident caused by farm tractor 01/15/2016   Colon polyps 06/24/2013    Immunization History  Administered Date(s) Administered   Fluad Quad(high Dose 65+) 01/05/2019, 01/09/2020, 04/01/2021   Influenza,inj,Quad PF,6+ Mos 02/20/2013, 01/03/2014, 02/05/2015, 01/15/2016, 03/26/2016, 02/02/2017   Influenza-Unspecified 04/13/2010, 01/11/2018   PFIZER(Purple Top)SARS-COV-2 Vaccination 05/23/2019, 06/17/2019   PNEUMOCOCCAL CONJUGATE-20 07/23/2021   Pneumococcal Conjugate-13 01/06/2012, 01/03/2014   Pneumococcal Polysaccharide-23 12/19/2012   Td 05/14/2014   Tdap 07/23/2021   Zoster Recombinat (Shingrix) 10/27/2018, 01/31/2019   Zoster, Live 12/19/2014    Conditions to be addressed/monitored:  Hypertension, Hyperlipidemia, and Diabetes  Care Plan : [place]  Updates made by Charlton Haws, [place] since 12/09/2021 12:00 AM     Problem: Hypertension, Hyperlipidemia, and Diabetes   Priority: High     Long-Range Goal: Disease Management   Start Date: 07/18/2020  Expected End Date: 06/04/2022  Recent Progress: On track  Priority: High  Note:   Current Barriers: Unable to achieve control of diabetes   Pharmacist Clinical Goal(s):  Patient will adhere to plan to optimize therapeutic regimen for diabetes as evidenced by report of adherence to recommended medication management changes through collaboration with PharmD and provider.   Interventions: 1:1 collaboration with Tonia Ghent, MD regarding development and update of comprehensive plan of care as evidenced by provider attestation and co-signature Inter-disciplinary care team collaboration (see longitudinal plan of care) Comprehensive medication review performed; medication list updated in electronic medical record   Hypertension (BP goal <130/80) -Controlled - per patient reported home log -Current home readings: ~114/80, 80 bpm (uses an arm cuff for at  home monitoring) -Current treatment: Amlodipine 5 mg daily - Appropriate, Effective, Safe, Accessible Lisinopril 30 mg daily - Appropriate, Effective, Safe, Accessible HCTZ 12.5 mg daily - Appropriate, Effective, Safe, Accessible -Medications previously tried: none -Continue to monitor BP at home daily, document, and provide log at future appointments -Recommended continue current medications   Diabetes (A1c goal <7%) -Uncontrolled, improving - A1c 9.0% (10/2021) improved from >81%, started Trulicity 2/75/17 and home BG improving overall, but still struggles with dietary indiscretions -Pt took Trulicity 1.5 + 0.01 mg yesterday, he has not yet taken the 3 mg dose (he did receive 3 mg dose but has excess supply of previous doses so he is using those up first) Reviewed AGP report: 11/22/21 to 12/05/21. Sensor active: 83%  Time in range (70-180): 54% (goal > 70%)  High (>180): 46%  Low (< 70): 0% (goal < 4%)  GMI: 7.7%; Average glucose: 182  Previous AGP report: 09/23/21 to 10/06/21  Time in range (70-180): 38% (goal > 70%)  High (181-250): 62%  Low (< 70): 0% (goal < 4%)  GMI: 8.3%; Average glucose: 210  -Current medications: Tresiba 200 u/mL 70 units daily - Appropriate, Query Effective Glipizide 5 mg - 2 AM, 1 PM -  Appropriate, Query Effective Jardiance 10 mg daily (PAP)  - Appropriate, Query Effective Trulicity 3 mg weekly (Mon) (PAP) -  Appropriate, Query Effective -Medications previously tried: metformin 500 mg (GI), Levemir -Current meal patterns: high carb diet  -Reviewed AGP report - TIR has improved, sugars remain elevated throughout most of the day; suspect higher dose of Trulicity will help with this, he has not yet titrated up to 3 mg -Advised to use Trulicity 1.5 mg x 2 each week until supply runs out, then use 3 mg pens  Hyperlipidemia: (LDL goal < 70) -Controlled - LDL 48 (07/2021) at goal -Current treatment: Atorvastatin 80 mg daily - Appropriate, Effective, Safe,  Accessible -Medications previously tried: n/a  -Educated on Cholesterol goals; Benefits of statin for ASCVD risk reduction; -Recommended to continue current medication  Patient Goals/Self-Care Activities Patient will: - take medications as prescribed - check glucose daily, document, and provide at future appointments - check blood pressure with abnormal symptoms     Medication Assistance: [place] approved through 46/95/07 Trulicity - [employer] Cares approved through 04/12/22  Star Rating Drugs:  Atorvastatin 80 mg - PDC 95% Glipizide 5 mg - PDC 92%             Lisinopril 30 mg - PDC 92%   Medication Access: Within the past 30 days, how often has patient missed a dose of medication? 0 Is a pillbox or other method used to improve adherence? Yes  Factors that may affect medication adherence? no barriers identified Are meds synced by current pharmacy? Yes  Are meds delivered by current pharmacy? Yes  Does patient experience delays in picking up medications due to transportation concerns? No   [facility] Reviewed: Is patient disadvantaged to use [facility]?: No  Current Rx insurance plan: [facility] [place] Name and location of Current pharmacy:  [facility] - [place], [place] - [address] [place] [address] Phone: [phone] Fax: [phone]  30-day packs (last 11/19/21) Packs: Prevagen chew 30 take 1 tablet breakfast Glipizide 5 mg - 2 tablets breakfast,1 tablet evening meal  Atorvastatin 71m take 1 tablet evening meal Amlodipine 549mtake 1 tablet breakfast Hydrochlorothiazide 12.69m40make 1 tablet breakfast Lisinopril 30 mg take 1 tablet evening meal  Patient receives from manufacturer Jardiance 10 mg - 1 tablet morning - gets from manufacturer Trulicity 0.72.81  Inject once weekly  - gets from manufacturer   [facility] Up Patient Decision:  Patient agrees to Care Plan and Follow-up.  Follow Up Plan: Telephone follow up appointment with care management team member scheduled for: 4 weeks  LinCharlene BrookeharmD, BCACP Clinical Pharmacist [phone]

## 2021-12-10 ENCOUNTER — Telehealth: Payer: Self-pay

## 2021-12-10 NOTE — Progress Notes (Signed)
Chronic Care Management Pharmacy Assistant   Name: Christian [name]  MRN: [id number] DOB: 1941/03/26  Reason for Encounter: CCM (Medication Adherence and Delivery Coordination)  Recent office visits:  None since last CCM contact  Recent consult visits:  None since last CCM contact  Hospital visits:  None in previous 6 months  Medications: Outpatient Encounter Medications as of 12/10/2021  Medication Sig Note   amLODipine (NORVASC) 5 MG tablet TAKE ONE TABLET BY MOUTH ONCE DAILY    atorvastatin (LIPITOR) 80 MG tablet TAKE ONE TABLET BY MOUTH EVERY EVENING    Continuous Blood Gluc Receiver (FREESTYLE LIBRE 2 READER) DEVI Use with sensors to monitor sugar continuously    Continuous Blood Gluc Sensor (FREESTYLE LIBRE 2 SENSOR) MISC Apply sensor every 14 days to monitor sugar continously    Dulaglutide (TRULICITY) 3 RK/2.7CW SOPN Inject 3 mg as directed once a week. 12/09/2021: Taking 2x 1.5 mg until supply runs out, then will use 3 mg pens   empagliflozin (JARDIANCE) 10 MG TABS tablet Take 1 tablet (10 mg total) by mouth daily before breakfast.    glipiZIDE (GLUCOTROL) 5 MG tablet TAKE TWO TABLETS BY MOUTH EVERY MORNING and TAKE ONE TABLET BY MOUTH EVERY EVENING    glucose blood (ONETOUCH ULTRA) test strip USE TO check blood glucose daily AS DIRECTED    hydrochlorothiazide (HYDRODIURIL) 12.5 MG tablet TAKE ONE TABLET BY MOUTH ONCE DAILY    insulin degludec (TRESIBA FLEXTOUCH) 200 UNIT/ML FlexTouch Pen Inject 70 Units into the skin daily.    Insulin Pen Needle (RELION PEN NEEDLE 31G/8MM) 31G X 8 MM MISC USE TO INJECT LEVEMIR DAILY    lisinopril (ZESTRIL) 30 MG tablet TAKE ONE TABLET BY MOUTH EVERY EVENING    No facility-administered encounter medications on file as of 12/10/2021.   BP Readings from Last 3 Encounters:  11/03/21 112/72  07/21/21 130/70  04/28/21 (!) 80/50    Lab Results  Component Value Date   HGBA1C 9.0 (A) 11/03/2021    No OVs, Consults, or hospital visits since  last care coordination call / Pharmacist visit. No medication changes indicated  Last adherence delivery date: 10/21/2021      Patient is due for next adherence delivery on: 12/18/2021  Spoke with patient on 12/10/2021 reviewed medications and coordinated delivery.  This delivery to include: Adherence Packaging   30 days  Packs: Prevagen chew 30 take 1 tablet breakfast Glipizide 5 mg - 2 tablets breakfast,1 tablet evening meal  Atorvastatin '80mg'$  take 1 tablet evening meal Amlodipine '5mg'$  take 1 tablet breakfast Hydrochlorothiazide 12.'5mg'$  take 1 tablet breakfast Lisinopril '30mg'$  take 1 tablet evening meal   Vial medications: Pen Needles - 31g29m - use daily with insulin  Delivered on 08/30/20232 - Now on auto-fill with [facility] - make sure he is changing every 14 days - if patient denies due to extra supply it will cancel auto-fill per Chas[facility] - talk to patient about this in October.  Freestyle Libre 2 Sensors - Change sensors every 14 days   Not due until October:  One Touch Test Strips - Use once daily  Patient declined the following medications this month: None   Patient receives from manufacturer Jardiance 10 mg - 1 tablet morning - gets from manufacturer Trulicity 02.37mg  Inject once weekly  - gets from manufacturer   Patient [email or web address] - [id number]  Any  concerns about your medications? No   How often do you forget or accidentally miss a dose? Rarely   Do you use a pillbox? No  Is patient in packaging Yes  If yes  What is the date on your next pill pack? 12/10/2021  Any concerns or issues with your packaging? No  Refills requested from providers include: Not complete Pen Needles - 31g54m - use daily with insulin  Confirmed delivery date of 12/18/2021, advised patient that pharmacy will contact them the morning of delivery.   Recent blood  glucose readings are as follows:  DATE  BLOOD GLUCOSE  BLOOD PRESSURE  PULSE 08/30  132    140/87    78 08/29  125    127/81    76 08/28  140    142/82    78 08/27  135    124/83    73 08/26  134    143/88    78 08/25  153 - [place] the night before 148/80    73 08/24  131    140/86    80   Annual wellness visit in last year? Yes 06/24/2021 Most Recent BP reading: 130/70 on 07/21/2021   If Diabetic: Most recent A1C reading: 9.3 on 07/21/2021 Last eye exam / retinopathy screening: Up to date Last diabetic foot exam: Up to date   LCharlene Brooke CPP notified   AMarijean Niemann [phone] Cycle dispensing form [facility] for review.

## 2021-12-14 ENCOUNTER — Other Ambulatory Visit: Payer: Self-pay | Admitting: Family Medicine

## 2021-12-26 ENCOUNTER — Other Ambulatory Visit: Payer: Self-pay | Admitting: Orthopedic Surgery

## 2021-12-26 DIAGNOSIS — M25551 Pain in right hip: Secondary | ICD-10-CM

## 2021-12-31 ENCOUNTER — Telehealth: Payer: Self-pay

## 2021-12-31 NOTE — Progress Notes (Signed)
    Chronic Care Management Pharmacy Assistant   Name: Christian [name]  MRN: [id number] DOB: October 04, 1940  Reason for Encounter: CCM (Appointment Reminder)  Medications: Outpatient Encounter Medications as of 12/31/2021  Medication Sig Note   amLODipine (NORVASC) 5 MG tablet TAKE ONE TABLET BY MOUTH ONCE DAILY    atorvastatin (LIPITOR) 80 MG tablet TAKE ONE TABLET BY MOUTH EVERY EVENING    COMFORT EZ PEN NEEDLES 31G X 8 MM MISC USE TO INJECT LEVEMIR DAILY    Continuous Blood Gluc Receiver (FREESTYLE LIBRE 2 READER) DEVI Use with sensors to monitor sugar continuously    Continuous Blood Gluc Sensor (FREESTYLE LIBRE 2 SENSOR) MISC Apply sensor every 14 days to monitor sugar continously    Dulaglutide (TRULICITY) 3 XF/8.1WE SOPN Inject 3 mg as directed once a week. 12/09/2021: Taking 2x 1.5 mg until supply runs out, then will use 3 mg pens   empagliflozin (JARDIANCE) 10 MG TABS tablet Take 1 tablet (10 mg total) by mouth daily before breakfast.    glipiZIDE (GLUCOTROL) 5 MG tablet TAKE TWO TABLETS BY MOUTH EVERY MORNING and TAKE ONE TABLET BY MOUTH EVERY EVENING    glucose blood (ONETOUCH ULTRA) test strip USE TO check blood glucose daily AS DIRECTED    hydrochlorothiazide (HYDRODIURIL) 12.5 MG tablet TAKE ONE TABLET BY MOUTH ONCE DAILY    insulin degludec (TRESIBA FLEXTOUCH) 200 UNIT/ML FlexTouch Pen Inject 70 Units into the skin daily.    lisinopril (ZESTRIL) 30 MG tablet TAKE ONE TABLET BY MOUTH EVERY EVENING    No facility-administered encounter medications on file as of 12/31/2021.   [name] was contacted to remind of upcoming telephone visit with [name] on 01/07/2022 at 8:45. Patient was reminded to have any blood glucose and blood pressure readings available for review at appointment.   Patient confirmed appointment.  Are you having any problems with your medications? No   Do you have any concerns you like to discuss with the pharmacist? No  CCM referral has been placed  prior to visit?  No    Star Rating Drugs: Medication:                Last Fill:         Day Supply Atorvastatin 80 mg   99/37/1696      30 Trulicity 3 mg             Discontinued 11/03/21 due to GI issues Glipizide 5 mg           12/15/2021      30 Lisinopril 30 mg        12/15/2021      [place], CPP notified   [name], [place] Pharmacy Assistant [phone]

## 2022-01-02 ENCOUNTER — Ambulatory Visit
Admission: RE | Admit: 2022-01-02 | Discharge: 2022-01-02 | Disposition: A | Discharge status: Home / Self Care | Payer: Medicare Other | Source: Ambulatory Visit | Attending: Orthopedic Surgery | Admitting: Orthopedic Surgery

## 2022-01-02 ENCOUNTER — Other Ambulatory Visit: Payer: Self-pay | Admitting: Orthopedic Surgery

## 2022-01-02 DIAGNOSIS — M545 Low back pain, unspecified: Secondary | ICD-10-CM

## 2022-01-02 DIAGNOSIS — M47817 Spondylosis without myelopathy or radiculopathy, lumbosacral region: Secondary | ICD-10-CM | POA: Diagnosis not present

## 2022-01-02 DIAGNOSIS — M25551 Pain in right hip: Secondary | ICD-10-CM

## 2022-01-02 MED ORDER — IOPAMIDOL (ISOVUE-M 200) INJECTION 41%
1.0000 mL | Freq: Once | INTRAMUSCULAR | Status: AC
  Administered 2022-01-02: 1 mL via EPIDURAL

## 2022-01-02 MED ORDER — METHYLPREDNISOLONE ACETATE 40 MG/ML INJ SUSP (RADIOLOG
80.0000 mg | Freq: Once | INTRAMUSCULAR | Status: AC
  Administered 2022-01-02: 80 mg via EPIDURAL

## 2022-01-02 NOTE — Discharge Instructions (Signed)

## 2022-01-05 ENCOUNTER — Telehealth: Payer: Medicare Other

## 2022-01-07 ENCOUNTER — Ambulatory Visit: Payer: Medicare Other | Admitting: Pharmacist

## 2022-01-07 ENCOUNTER — Telehealth: Payer: Self-pay

## 2022-01-07 DIAGNOSIS — E1142 Type 2 diabetes mellitus with diabetic polyneuropathy: Secondary | ICD-10-CM

## 2022-01-07 DIAGNOSIS — I10 Essential (primary) hypertension: Secondary | ICD-10-CM

## 2022-01-07 DIAGNOSIS — E785 Hyperlipidemia, unspecified: Secondary | ICD-10-CM

## 2022-01-07 NOTE — Progress Notes (Addendum)
    Chronic Care Management Pharmacy Assistant   Name: Christian [name]  MRN: [id number] DOB: 1941/02/01  Reason for Encounter: CCM (Jardiance New Rx)   [name] will refill Jardiance 10 mg for 90ds with 3 refills. Medication will be delivered in 10-14 business days to patient's home.   [name], CPP notified  [name], [place] Clinical Pharmacy Assistant [phone]

## 2022-01-07 NOTE — Progress Notes (Addendum)
Chronic Care Management Pharmacy Assistant   Name: Christian [name]  MRN: [id number] DOB: 1941-01-07  Reason for Encounter: CCM (Medication Adherence and Delivery Coordination)  Recent office visits:  None since last CCM contact  Recent consult visits:  None since last CCM contact  Hospital visits:  None in previous 6 months  Medications: Outpatient Encounter Medications as of 01/07/2022  Medication Sig Note   amLODipine (NORVASC) 5 MG tablet TAKE ONE TABLET BY MOUTH ONCE DAILY    atorvastatin (LIPITOR) 80 MG tablet TAKE ONE TABLET BY MOUTH EVERY EVENING    COMFORT EZ PEN NEEDLES 31G X 8 MM MISC USE TO INJECT LEVEMIR DAILY    Continuous Blood Gluc Receiver (FREESTYLE LIBRE 2 READER) DEVI Use with sensors to monitor sugar continuously    Continuous Blood Gluc Sensor (FREESTYLE LIBRE 2 SENSOR) MISC Apply sensor every 14 days to monitor sugar continously    Dulaglutide (TRULICITY) 3 AV/6.9VX SOPN Inject 3 mg as directed once a week. 12/09/2021: Taking 2x 1.5 mg until supply runs out, then will use 3 mg pens   empagliflozin (JARDIANCE) 10 MG TABS tablet Take 1 tablet (10 mg total) by mouth daily before breakfast.    glipiZIDE (GLUCOTROL) 5 MG tablet TAKE TWO TABLETS BY MOUTH EVERY MORNING and TAKE ONE TABLET BY MOUTH EVERY EVENING    glucose blood (ONETOUCH ULTRA) test strip USE TO check blood glucose daily AS DIRECTED    hydrochlorothiazide (HYDRODIURIL) 12.5 MG tablet TAKE ONE TABLET BY MOUTH ONCE DAILY    insulin degludec (TRESIBA FLEXTOUCH) 200 UNIT/ML FlexTouch Pen Inject 70 Units into the skin daily.    lisinopril (ZESTRIL) 30 MG tablet TAKE ONE TABLET BY MOUTH EVERY EVENING    No facility-administered encounter medications on file as of 01/07/2022.   BP Readings from Last 3 Encounters:  01/02/22 134/82  11/03/21 112/72  07/21/21 130/70    Lab Results  Component Value Date   HGBA1C 9.0 (A) 11/03/2021    No OVs, Consults, or hospital visits since last care coordination call  / Pharmacist visit. No medication changes indicated  Last adherence delivery date: 12/18/2021      Patient is due for next adherence delivery on: 01/19/2022  This delivery to include: Adherence Packaging   30 days  Packs: Prevagen chew 30 take 1 tablet breakfast Glipizide 5 mg - 2 tablets breakfast,1 tablet evening meal  Atorvastatin '80mg'$  take 1 tablet evening meal Amlodipine '5mg'$  take 1 tablet breakfast Hydrochlorothiazide 12.'5mg'$  take 1 tablet breakfast Lisinopril '30mg'$  take 1 tablet evening meal    Patient declined the following medications this month: Pen Needles - 31g57m - use daily with insulin Onetouch Ultra Test Strips - Use once daily as directed   Patient receives from manufacturer Jardiance 10 mg - 1 tablet morning - gets from manufacturer Trulicity 04.80mg  Inject once weekly  - gets from manufacturer   [facility] [place] [id number] - [facility]  Any concerns about your medications? No   How often do you forget or accidentally miss a dose? Rarely   Do you use a pillbox? No  No refill request needed.  Annual wellness visit in last year? Yes 06/24/2021 Most Recent BP reading: 130/70 on 07/21/2021   If Diabetic: Most recent A1C reading: 9.3 on 07/21/2021 Last eye exam / retinopathy screening: Up to date Last diabetic foot exam: Up to date   LCharlene Brooke CPP notified   [place]arijean Niemann [place]  Assistant [phone]  Cycle dispensing form sent to [facility], CPP for review.  [name], CPP notified  [name], [place] Clinical Pharmacy Assistant [phone]

## 2022-01-07 NOTE — Progress Notes (Signed)
Chronic Care Management  Pharmacy Note  01/07/2022 Name:  Christian [name] MRN:  [id number] DOB:  February 23, 1941  Summary: CCM F/U visit -DM: A1c 9.0% (10/2021) slowly improving from >10% previously -Pt has apparently been off of Jardiance for some time, it is not amongst his bottles and he has not received delivery from [facility]. -Assessed AGP report prior to steroid injection 9/23: 12/20/21 to 01/02/22. Sensor active: 97%  Time in range (70-180): 56% (goal > 70%)  High (>180): 44%  Low (< 70): 0% (goal < 4%)  GMI: 7.6%; Average glucose: 179  Recommendations:  -Pharmacy team to contact [place] for Jardiance refill; advised pt to restart medication   Follow up: -Pharmacist follow up televisit scheduled for 6 weeks -PCP F/U 02/03/22    Subjective: Christian [name] is an 81 y.o. year old male who is a primary patient of [name], [name], MD.  The CCM team was consulted for assistance with disease management and care coordination needs.    Engaged with patient by telephone for follow up visit in response to provider referral for pharmacy case management   Consent to Services:  The patient was given information about Chronic Care Management services, agreed to services, and gave verbal consent prior to initiation of services.  Please see initial visit note for detailed documentation.   Patient Care Team: Tonia Ghent, MD as PCP - General (Family Medicine) Charlton Haws, [facility] as Pharmacist (Pharmacist)  Recent office visits: 11/03/21 Dr [name] OV: f/u DM. A1c 9.0%; Increase Trulicity to 3 mg. F/u 3 months.  04/21/21 - Elsie Stain, MD - Pt presented for diabetes follow up. A1c increased from 9.1 to 10.5%. Foot exam normal. Rectal bleeding, refer to GI. Fungal infection, start nystatin. Follow up 3 months.  03/26/21 - CCM diabetes follow up - Pt approved for PAP (Trulicity 1.60 mg weekly and Jardiance 10 mg), Start Trulicity this week. Improve diabetes diet/exercise.  Recent  consult visits: 04/28/21 - NP [place] (GI) - Pt presented for rectal bleeding. Exam showed internal and external hemorrhoids without active bleeding. Use MiraLAX nightly PRN. Apply Desitin. Update CBC. BP 80/50, recheck BP came up to 110/58.   Hospital visits: None since last CCM contact  Objective:  Lab Results  Component Value Date   CREATININE 1.30 07/21/2021   BUN 27 (H) 07/21/2021   GFR 52.06 (L) 07/21/2021   GFRNONAA >60 08/22/2020   GFRAA >60 05/17/2016   NA 141 07/21/2021   K 4.5 07/21/2021   CALCIUM 9.6 07/21/2021   CO2 25 07/21/2021   GLUCOSE 145 (H) 07/21/2021    Lab Results  Component Value Date/Time   HGBA1C 9.0 (A) 11/03/2021 08:47 AM   HGBA1C 9.3 (H) 07/21/2021 12:49 PM   HGBA1C 10.5 (A) 04/21/2021 10:01 AM   HGBA1C 9.1 (H) 08/08/2020 10:03 AM   GFR 52.06 (L) 07/21/2021 12:49 PM   GFR 66.20 08/08/2020 10:03 AM   MICROALBUR 1.6 01/04/2020 08:02 AM   MICROALBUR 0.7 03/07/2018 09:41 AM    Last diabetic Eye exam:  Lab Results  Component Value Date/Time   HMDIABEYEEXA No Retinopathy 08/30/2020 12:00 AM    Last diabetic Foot exam: 08/08/20  PCP diabetic foot ulcer  Lab Results  Component Value Date   CHOL 123 07/21/2021   HDL 31.90 (L) 07/21/2021   LDLDIRECT 48.0 07/21/2021   TRIG 379.0 (H) 07/21/2021   CHOLHDL 4 07/21/2021       Latest Ref Rng & Units 07/21/2021   12:49  PM 08/18/2020    1:24 PM 01/04/2020    8:02 AM  Hepatic Function  Total Protein 6.0 - 8.3 g/dL 7.0  6.7  7.3   Albumin 3.5 - 5.2 g/dL 4.2  3.3  4.2   AST 0 - 37 U/L '14  23  26   ' ALT 0 - 53 U/L '11  18  19   ' Alk Phosphatase 39 - 117 U/L 94  55  64   Total Bilirubin 0.2 - 1.2 mg/dL 1.0  0.9  0.9     Lab Results  Component Value Date/Time   TSH 3.66 07/21/2021 12:49 PM   TSH 3.337 03/06/2016 03:45 AM       Latest Ref Rng & Units 07/21/2021   12:49 PM 04/28/2021    2:21 PM 08/30/2020    3:00 PM  CBC  WBC 4.0 - 10.5 K/uL 7.7  9.4  9.2   Hemoglobin 13.0 - 17.0 g/dL 13.5  13.7   12.4   Hematocrit 39.0 - 52.0 % 41.1  41.5  38.0   Platelets 150.0 - 400.0 K/uL 205.0  213.0  222    Clinical ASCVD: No  The ASCVD Risk score (Arnett DK, et al., 2019) failed to calculate for the following reasons:   The 2019 ASCVD risk score is only valid for ages 50 to 1       06/24/2021    2:09 PM 04/21/2021   10:02 AM 04/16/2020    9:01 AM  Depression screen PHQ 2/9  Decreased Interest 0 0 0  Down, Depressed, Hopeless 0 0 0  PHQ - 2 Score 0 0 0    Social History   Tobacco Use  Smoking Status Never  Smokeless Tobacco Never   BP Readings from Last 3 Encounters:  01/02/22 134/82  11/03/21 112/72  07/21/21 130/70   Pulse Readings from Last 3 Encounters:  01/02/22 76  11/03/21 71  07/21/21 99   Wt Readings from Last 3 Encounters:  11/03/21 215 lb (97.5 kg)  07/21/21 210 lb (95.3 kg)  06/24/21 214 lb (97.1 kg)   BMI Readings from Last 3 Encounters:  11/03/21 29.16 kg/m  07/21/21 28.48 kg/m  06/24/21 29.02 kg/m    Assessment/Interventions: Review of patient past medical history, allergies, medications, health status, including review of consultants reports, laboratory and other test data, was performed as part of comprehensive evaluation and provision of chronic care management services.   SDOH:  (Social Determinants of Health) assessments and interventions performed: No - done March 2023  [place]: No Food Insecurity (06/24/2021)  Housing: Low Risk  (06/24/2021)  Transportation Needs: No Transportation Needs (06/24/2021)  Alcohol Screen: Low Risk  (06/24/2021)  Depression (PHQ2-9): Low Risk  (06/24/2021)  Financial Resource Strain: Low Risk  (06/24/2021)  Physical Activity: Inactive (06/24/2021)  Social Connections: Moderately Integrated (06/24/2021)  Stress: No Stress Concern Present (06/24/2021)  Tobacco Use: Low Risk  (11/03/2021)    CCM Care Plan  No Known Allergies  Medications Reviewed Today     Reviewed by Charlton Haws,  [facility] (Pharmacist) on 01/09/22 at 0844  Med List Status: <None>   Medication Order Taking? Sig Documenting Provider Last Dose Status Informant  amLODipine (NORVASC) 5 MG tablet [id number] Yes TAKE ONE TABLET BY MOUTH ONCE DAILY Tonia Ghent, MD Taking Active   atorvastatin (LIPITOR) 80 MG tablet [id number] Yes TAKE ONE TABLET BY MOUTH EVERY EVENING Tonia Ghent, MD Taking Active   COMFORT EZ PEN NEEDLES 31G X  8 MM MISC [id number] Yes USE TO INJECT LEVEMIR DAILY Tonia Ghent, MD Taking Active   Continuous Blood Gluc Receiver (FREESTYLE LIBRE 2 READER) [place] [id number] Yes Use with sensors to monitor sugar continuously Tonia Ghent, MD Taking Active   Continuous Blood Gluc Sensor (FREESTYLE [place] 2 SENSOR) [place] [id number] Yes Apply sensor every 14 days to monitor sugar continously Tonia Ghent, MD Taking Active   Dulaglutide (TRULICITY) 3 LN/9.8XQ [place] [id number] Yes Inject 3 mg as directed once a week. Tonia Ghent, MD Taking Active            Med Note Lenord Fellers, Cle[place]   Fri Jan 09, 2022  8:44 AM)    empagliflozin (JARDIANCE) 10 MG TABS tablet [id number] No Take 1 tablet (10 mg total) by mouth daily before breakfast.  Patient not taking: Reported on 01/09/2022   Tonia Ghent, MD Not Taking Active            Med Note Earna Coder Nov 03, 2021  8:41 AM)    glipiZIDE (GLUCOTROL) 5 MG tablet [id number] Yes TAKE TWO TABLETS BY MOUTH EVERY MORNING and TAKE ONE TABLET BY MOUTH EVERY EVENING Tonia Ghent, MD Taking Active   glucose blood (ONETOUCH ULTRA) test strip [id number] Yes USE TO check blood glucose daily AS DIRECTED Tonia Ghent, MD Taking Active   hydrochlorothiazide (HYDRODIURIL) 12.5 MG tablet [id number] Yes TAKE ONE TABLET BY MOUTH ONCE DAILY Tonia Ghent, MD Taking Active   insulin degludec (TRESIBA FLEXTOUCH) 200 UNIT/ML FlexTouch Pen [id number] Yes Inject 70 Units into the skin daily.  Patient taking differently: Inject 40 Units into the skin daily.    Tonia Ghent, MD Taking Active   lisinopril (ZESTRIL) 30 MG tablet [id number] Yes TAKE ONE TABLET BY MOUTH EVERY EVENING Tonia Ghent, MD Taking Active             Patient Active Problem List   Diagnosis Date Noted   Ankle pain 07/23/2021   BRBPR (bright red blood per rectum) 04/23/2021   Lumbar spondylosis 12/26/2020   Osteomyelitis of fifth toe of right foot ([place])    Diabetic foot infection ([place]) 08/18/2020   Diabetic foot ulcer ([place]) 08/11/2020   Healthcare maintenance 01/10/2020   Lower back pain 09/27/2019   Thumb pain 06/25/2019   Medicare annual wellness visit, subsequent 03/12/2018   Advance care planning 03/12/2018   Shoulder pain 02/03/2017   Edema 11/02/2016   Diabetes mellitus with microalbuminuria ([place]) 06/10/2016   HLD (hyperlipidemia) 06/10/2016   DM type 2 with diabetic peripheral neuropathy ([place])    HTN (hypertension)    Accident caused by farm tractor 01/15/2016   Colon polyps 06/24/2013    Immunization History  Administered Date(s) Administered   Fluad Quad(high Dose 65+) 01/05/2019, 01/09/2020, 04/01/2021   Influenza,inj,Quad PF,6+ Mos 02/20/2013, 01/03/2014, 02/05/2015, 01/15/2016, 03/26/2016, 02/02/2017   Influenza-Unspecified 04/13/2010, 01/11/2018   PFIZER(Purple Top)SARS-COV-2 Vaccination 05/23/2019, 06/17/2019   PNEUMOCOCCAL CONJUGATE-20 07/23/2021   Pneumococcal Conjugate-13 01/06/2012, 01/03/2014   Pneumococcal Polysaccharide-23 12/19/2012   Td 05/14/2014   Tdap 07/23/2021   Zoster Recombinat (Shingrix) 10/27/2018, 01/31/2019   Zoster, Live 12/19/2014    Conditions to be addressed/monitored:  Hypertension, Hyperlipidemia, and Diabetes  Care Plan : [place]  Updates made by Charlton Haws, [place] since 01/09/2022 12:00 AM     Problem: Hypertension, Hyperlipidemia, and Diabetes   Priority: High     Long-Range Goal: Disease Management   Start Date: 07/18/2020  Expected End Date: 06/04/2022  Recent Progress:  On track  Priority: High  Note:   Current Barriers: Unable to achieve control of diabetes   Pharmacist Clinical Goal(s):  Patient will adhere to plan to optimize therapeutic regimen for diabetes as evidenced by report of adherence to recommended medication management changes through collaboration with PharmD and provider.   Interventions: 1:1 collaboration with Tonia Ghent, MD regarding development and update of comprehensive plan of care as evidenced by provider attestation and co-signature Inter-disciplinary care team collaboration (see longitudinal plan of care) Comprehensive medication review performed; medication list updated in electronic medical record   Hypertension (BP goal <130/80) -Controlled - per patient reported home log -Current home readings: ~114/80, 80 bpm (uses an arm cuff for at home monitoring) -Current treatment: Amlodipine 5 mg daily - Appropriate, Effective, Safe, Accessible Lisinopril 30 mg daily - Appropriate, Effective, Safe, Accessible HCTZ 12.5 mg daily - Appropriate, Effective, Safe, Accessible -Medications previously tried: none -Continue to monitor BP at home daily, document, and provide log at future appointments -Recommended continue current medications   Diabetes (A1c goal <7%) -Uncontrolled, improving - A1c 9.0% (10/2021) improved from >10%, pt just finished his supply of 1.5 mg (has been taking 2x weekly) and will start with 3 mg dose next week; he has titrated insulin down from 70 to 40 units daily while increasing Trulicity; -Pt had epidural steroid shot 01/03/22, he has needed more insulin (50-60 units) the past few days -Pt reports he does not have Jardiance (gets through [name]) - he is not sure when he ran out -Reviewed AGP report: 12/25/21 to 01/07/22. Sensor active: 92%  Time in range (70-180): 49% (goal > 70%)  High (>180): 51% (Very high 18%)  Low (< 70): 0% (goal < 4%)  GMI: 7.9%; Average glucose: 190    -Assessed AGP report  prior to steroid injection 9/23: Reviewed AGP report: 12/20/21 to 01/02/22. Sensor active: 97%  Time in range (70-180): 56% (goal > 70%)  High (>180): 44%  Low (< 70): 0% (goal < 4%)  GMI: 7.6%; Average glucose: 179  -Current medications: Tresiba 200 u/mL 40 units daily - Appropriate, Query Effective Glipizide 5 mg - 2 AM, 1 PM -  Appropriate, Query Effective Jardiance 10 mg daily (PAP)  - Appropriate, Query Effective Trulicity 3 mg weekly (Mon) (PAP) - Appropriate, Query Effective -Medications previously tried: metformin 500 mg (GI), Levemir -Current meal patterns: high carb diet  -Reviewed AGP report - prior to steroid injection, sugars are significantly improved -Pharmacy team to contact [place] for Jardiance refill -Recommend to continue current medication  Hyperlipidemia: (LDL goal < 70) -Controlled - LDL 48 (07/2021) at goal -Current treatment: Atorvastatin 80 mg daily - Appropriate, Effective, Safe, Accessible -Medications previously tried: n/a  -Educated on Cholesterol goals; Benefits of statin for ASCVD risk reduction; -Recommended to continue current medication  Patient Goals/Self-Care Activities Patient will: - take medications as prescribed - check glucose daily, document, and provide at future appointments - check blood pressure with abnormal symptoms     Medication Assistance: [place] approved through 45/40/98 Trulicity - [employer] approved through 04/12/22  Star Rating Drugs:  Atorvastatin 80 mg - PDC 98% Glipizide 5 mg - PDC 98%             Lisinopril 30 mg - PDC 98%   Medication Access: Within the past 30 days, how often has patient missed a dose of medication? 0 Is a pillbox or other method used to  improve adherence? Yes  Factors that may affect medication adherence? no barriers identified Are meds synced by current pharmacy? Yes  Are meds delivered by current pharmacy? Yes  Does patient experience delays in picking up medications due to  transportation concerns? No   [facility] Reviewed: Is patient disadvantaged to use [facility]?: No  Current Rx insurance plan: [facility] Name and location of Current pharmacy:  [facility] - [place], [place] - [place] [address] [place] [address] Phone: [phone] Fax: [phone]  30-day packs (last 12/18/21) Prevagen chew 30 take 1 tablet breakfast Glipizide 5 mg - 2 tablets breakfast,1 tablet evening meal  Atorvastatin 19m take 1 tablet evening meal Amlodipine 533mtake 1 tablet breakfast Hydrochlorothiazide 12.2m31make 1 tablet breakfast Lisinopril 52m46mke 1 tablet evening meal   Vial medications: Pen Needles - 31g8mm 28mse daily with insulin  Patient receives from manufacturer Jardiance 10 mg - 1 tablet morning - gets from manufacturer Trulicity 0.75 6.38Inject once weekly  - gets from manufacturer   [facility] [place] [facility] Decision:  Patient agrees to Care Plan and Follow-up.  Follow Up Plan: Telephone follow up appointment with care management team member scheduled for: 6 weeks  LindsCharlene BrookermD, BCACP Clinical Pharmacist [phone]

## 2022-01-09 NOTE — Patient Instructions (Signed)
Visit Information  Phone number for Pharmacist: [phone]   Goals Addressed   None     Care Plan : [place]  Updates made by Charlton Haws, [place] since 01/09/2022 12:00 AM     Problem: Hypertension, Hyperlipidemia, and Diabetes   Priority: High     Long-Range Goal: Disease Management   Start Date: 07/18/2020  Expected End Date: 06/04/2022  Recent Progress: On track  Priority: High  Note:   Current Barriers: Unable to achieve control of diabetes   Pharmacist Clinical Goal(s):  Patient will adhere to plan to optimize therapeutic regimen for diabetes as evidenced by report of adherence to recommended medication management changes through collaboration with PharmD and provider.   Interventions: 1:1 collaboration with Tonia Ghent, MD regarding development and update of comprehensive plan of care as evidenced by provider attestation and co-signature Inter-disciplinary care team collaboration (see longitudinal plan of care) Comprehensive medication review performed; medication list updated in electronic medical record   Hypertension (BP goal <130/80) -Controlled - per patient reported home log -Current home readings: ~114/80, 80 bpm (uses an arm cuff for at home monitoring) -Current treatment: Amlodipine 5 mg daily - Appropriate, Effective, Safe, Accessible Lisinopril 30 mg daily - Appropriate, Effective, Safe, Accessible HCTZ 12.5 mg daily - Appropriate, Effective, Safe, Accessible -Medications previously tried: none -Continue to monitor BP at home daily, document, and provide log at future appointments -Recommended continue current medications   Diabetes (A1c goal <7%) -Uncontrolled, improving - A1c 9.0% (10/2021) improved from >10%, pt just finished his supply of 1.5 mg (has been taking 2x weekly) and will start with 3 mg dose next week; he has titrated insulin down from 70 to 40 units daily while increasing Trulicity; -Pt had epidural steroid shot  01/03/22, he has needed more insulin (50-60 units) the past few days -Pt reports he does not have Jardiance (gets through [name]) - he is not sure when he ran out -Reviewed AGP report: 12/25/21 to 01/07/22. Sensor active: 92%  Time in range (70-180): 49% (goal > 70%)  High (>180): 51% (Very high 18%)  Low (< 70): 0% (goal < 4%)  GMI: 7.9%; Average glucose: 190    -Assessed AGP report prior to steroid injection 9/23: Reviewed AGP report: 12/20/21 to 01/02/22. Sensor active: 97%  Time in range (70-180): 56% (goal > 70%)  High (>180): 44%  Low (< 70): 0% (goal < 4%)  GMI: 7.6%; Average glucose: 179  -Current medications: Tresiba 200 u/mL 40 units daily - Appropriate, Query Effective Glipizide 5 mg - 2 AM, 1 PM -  Appropriate, Query Effective Jardiance 10 mg daily (PAP)  - Appropriate, Query Effective Trulicity 3 mg weekly (Mon) (PAP) - Appropriate, Query Effective -Medications previously tried: metformin 500 mg (GI), Levemir -Current meal patterns: high carb diet  -Reviewed AGP report - prior to steroid injection, sugars are significantly improved -Pharmacy team to contact [place] for Jardiance refill -Recommend to continue current medication  Hyperlipidemia: (LDL goal < 70) -Controlled - LDL 48 (07/2021) at goal -Current treatment: Atorvastatin 80 mg daily - Appropriate, Effective, Safe, Accessible -Medications previously tried: n/a  -Educated on Cholesterol goals; Benefits of statin for ASCVD risk reduction; -Recommended to continue current medication  Patient Goals/Self-Care Activities Patient will: - take medications as prescribed - check glucose daily, document, and provide at future appointments - check blood pressure with abnormal symptoms      Patient verbalizes understanding of instructions and care plan provided today and agrees to  view in [place]. Active MyChart status and patient understanding of how to access instructions and care plan via MyChart confirmed with  patient.    Telephone follow up appointment with pharmacy team member scheduled for: 6 weeks  [name], PharmD, BCACP Clinical Pharmacist [facility] [phone]

## 2022-01-12 DIAGNOSIS — Z961 Presence of intraocular lens: Secondary | ICD-10-CM | POA: Diagnosis not present

## 2022-01-12 DIAGNOSIS — E119 Type 2 diabetes mellitus without complications: Secondary | ICD-10-CM | POA: Diagnosis not present

## 2022-01-12 LAB — HM DIABETES EYE EXAM

## 2022-01-14 DIAGNOSIS — H905 Unspecified sensorineural hearing loss: Secondary | ICD-10-CM | POA: Diagnosis not present

## 2022-02-03 ENCOUNTER — Other Ambulatory Visit: Payer: Self-pay | Admitting: Family Medicine

## 2022-02-03 ENCOUNTER — Encounter: Payer: Self-pay | Admitting: Family Medicine

## 2022-02-03 ENCOUNTER — Ambulatory Visit (INDEPENDENT_AMBULATORY_CARE_PROVIDER_SITE_OTHER): Payer: Medicare Other | Admitting: Family Medicine

## 2022-02-03 VITALS — BP 128/70 | HR 84 | Temp 97.1°F | Ht 72.0 in | Wt 224.0 lb

## 2022-02-03 DIAGNOSIS — E1142 Type 2 diabetes mellitus with diabetic polyneuropathy: Secondary | ICD-10-CM | POA: Diagnosis not present

## 2022-02-03 DIAGNOSIS — Z23 Encounter for immunization: Secondary | ICD-10-CM

## 2022-02-03 LAB — BASIC METABOLIC PANEL
BUN: 20 mg/dL (ref 6–23)
CO2: 26 mEq/L (ref 19–32)
Calcium: 9.2 mg/dL (ref 8.4–10.5)
Chloride: 102 mEq/L (ref 96–112)
Creatinine, Ser: 1.36 mg/dL (ref 0.40–1.50)
GFR: 49.13 mL/min — ABNORMAL LOW (ref >60.00)
Glucose, Bld: 226 mg/dL — ABNORMAL HIGH (ref 70–99)
Potassium: 4.3 mEq/L (ref 3.5–5.1)
Sodium: 139 mEq/L (ref 135–145)

## 2022-02-03 LAB — POCT GLUCOSE (DEVICE FOR HOME USE): POC Glucose: 290 mg/dl — AB (ref 70–99)

## 2022-02-03 LAB — HEMOGLOBIN A1C: Hgb A1c MFr Bld: 8.7 % — ABNORMAL HIGH (ref 4.6–6.5)

## 2022-02-03 MED ORDER — TRESIBA FLEXTOUCH 200 UNIT/ML ~~LOC~~ SOPN: 40.0000 [IU] | PEN_INJECTOR | Freq: Every day | SUBCUTANEOUS | 5 refills | Status: DC

## 2022-02-03 MED ORDER — EMPAGLIFLOZIN 10 MG PO TABS: 10.0000 mg | ORAL_TABLET | Freq: Every day | ORAL | 3 refills | Status: DC

## 2022-02-03 MED ORDER — TRESIBA FLEXTOUCH 200 UNIT/ML ~~LOC~~ SOPN: 40.0000 [IU] | PEN_INJECTOR | Freq: Every day | SUBCUTANEOUS | Status: DC

## 2022-02-03 NOTE — Progress Notes (Unsigned)
Diabetes:  Using medications without difficulties: see below.  No ADE on meds.  Hypoglycemic episodes: only if prolonged fasting, ie like this AM.   Hyperglycemic episodes:no Feet problems: see below.   Blood Sugars averaging:100-160 usually in the AM eye exam within last year: yes Labs pending.  Has been off jardiance.  Sugar corrected/elevated after eating a cookie this AM.   His wife's son recently died, condolences offered.    Meds, vitals, and allergies reviewed.  ROS: Per HPI unless specifically indicated in ROS section   GEN: nad, alert and oriented HEENT: ncat NECK: supple w/o LA CV: rrr. PULM: ctab, no inc wob ABD: soft, +bs EXT: no edema SKIN: no acute rash  Diabetic foot exam: Normal inspection except for R5th toe amptutation.   No skin breakdown L foot callous, lateral foot.   R foot callous, medial foot.  Normal DP pulses Normal sensation to light touch and monofilament Nails normal  Patient consented for shaving calluses.  Each area cleaned with alcohol and shaved with a #12 blade.  Tolerated well.  No complication.  Partial removal of callus on each side.  No bleeding.  35 minutes were devoted to patient care in this encounter (this includes time spent reviewing the patient's file/history, interviewing and examining the patient, counseling/reviewing plan with patient).

## 2022-02-03 NOTE — Patient Instructions (Addendum)
Go to the lab on the way out.   If you have mychart we'll likely use that to update you.    Take care.  Glad to see you. Plan on recheck in 3 months.  Labs at the visit.  Restart jardiance. Keep the appointment with the foot clinic.

## 2022-02-04 NOTE — Assessment & Plan Note (Signed)
Plan on recheck in 3 months.  Labs at the visit.  Restart jardiance. Discussed with patient about foot care and follow-up with the foot clinic.  See exam above.  Condolences offered regarding his wife's son.

## 2022-02-05 ENCOUNTER — Telehealth: Payer: Self-pay

## 2022-02-05 NOTE — Progress Notes (Addendum)
    Chronic Care Management Pharmacy Assistant   Name: Christian [name]  MRN: [id number] DOB: [birth year]  While on the phone with patient for his medication adherence and delivery coordination I relayed Dr. Josefine Class lab result notes to the patient. Patient expressed understanding. Patient will restart Jardiance and keep a blood sugar log.  I called [name] to inquire about patient's delivery of Jardiance. Medication will be shipped out today and will arrive in 5 - 7 business days. Patient has been notified.   [name], CPP notified  Christian [name], [place] Clinical Pharmacy Assistant [phone]

## 2022-02-05 NOTE — Progress Notes (Signed)
Chronic Care Management Pharmacy Assistant   Name: Christian [name]  MRN: [id number] DOB: Jan 02, 1941  Reason for Encounter: CCM (Medication Adherence and Delivery Coordination)  Recent office visits:  02/03/22 Christian Stain, MD Influenza vaccine "Your kidney function is stable compared to 6 months ago.  Your A1c is slightly better at 8.7 but not at goal.  I would restart Jardiance as planned and let me know if your sugar is not improving in the meantime.  I think it makes sense to recheck in 3 months with labs at the visit"  Change: insulin degludec (TRESIBA FLEXTOUCH) 200 UNIT/ML FlexTouch Pen Administered: Fluad Quad (high dose 65+) DM foot exam performed: shaving calluses performed. FU 3 months  Recent consult visits:  None since last CCM contact  Hospital visits:  None in previous 6 months  Medications: Outpatient Encounter Medications as of 02/05/2022  Medication Sig   amLODipine (NORVASC) 5 MG tablet TAKE ONE TABLET BY MOUTH ONCE DAILY   atorvastatin (LIPITOR) 80 MG tablet TAKE ONE TABLET BY MOUTH EVERY EVENING   COMFORT EZ PEN NEEDLES 31G X 8 MM MISC USE TO INJECT LEVEMIR DAILY   Continuous Blood Gluc Receiver (FREESTYLE LIBRE 2 READER) DEVI Use with sensors to monitor sugar continuously   Continuous Blood Gluc Sensor (FREESTYLE LIBRE 2 SENSOR) MISC Apply sensor every 14 days to monitor sugar continously   Dulaglutide (TRULICITY) 3 XY/3.3XO SOPN Inject 3 mg as directed once a week.   empagliflozin (JARDIANCE) 10 MG TABS tablet Take 1 tablet (10 mg total) by mouth daily before breakfast.   glipiZIDE (GLUCOTROL) 5 MG tablet TAKE TWO TABLETS BY MOUTH EVERY MORNING and TAKE ONE TABLET BY MOUTH EVERY EVENING   glucose blood (ONETOUCH ULTRA) test strip USE TO check blood glucose daily AS DIRECTED   hydrochlorothiazide (HYDRODIURIL) 12.5 MG tablet TAKE ONE TABLET BY MOUTH ONCE DAILY   insulin degludec (TRESIBA FLEXTOUCH) 200 UNIT/ML FlexTouch Pen Inject 40 Units into the skin daily.    lisinopril (ZESTRIL) 30 MG tablet TAKE ONE TABLET BY MOUTH EVERY EVENING   No facility-administered encounter medications on file as of 02/05/2022.   BP Readings from Last 3 Encounters:  02/03/22 128/70  01/02/22 134/82  11/03/21 112/72    Lab Results  Component Value Date   HGBA1C 8.7 (H) 02/03/2022    Recent OV, Consult or Hospital visit:  Recent medication changes indicated:   Change: insulin degludec (TRESIBA FLEXTOUCH) 200 UNIT/ML FlexTouch Pen  Last adherence delivery date:  01/19/2022      Patient is due for next adherence delivery on: 02/17/2022  Spoke with patient on 02/05/2022 reviewed medications and coordinated delivery.  This delivery to include: Adherence Packaging   30 days  Packs: Prevagen chew 30 take 1 tablet breakfast Glipizide 5 mg - 2 tablets breakfast,1 tablet evening meal  Atorvastatin '80mg'$  take 1 tablet evening meal Amlodipine '5mg'$  take 1 tablet breakfast Hydrochlorothiazide 12.'5mg'$  take 1 tablet breakfast Lisinopril '30mg'$  take 1 tablet evening meal   Patient declined the following medications this month: Pen Needles - 31g68m - use daily with insulin  Sent to patient on 01/14/2022 for 100ds Onetouch Ultra Test Strips - Use once daily as directed  Sent to patient on 02/02/2022 for 28ds Sensors   Patient receives from manufacturer Jardiance 10 mg - 1 tablet morning - gets from [employer]- spoke with [employer] Medication will be shipped today (02/05/2022). Patient should receive it in 5 - 7 business days. Patient was made aware.  Trulicity 03.29  mg  Inject once weekly  - gets from manufacturer   Patient receives from [place] [address] - Patient will get from [facility]   Any concerns about your medications? No   How often do you forget or accidentally miss a dose? Rarely   Do you use a pillbox? No  Is patient in packaging Yes  If yes  What is the date on your next pill pack? 02/05/2022  Any concerns or issues with your  packaging? No  No refill request needed.  Confirmed delivery date of 02/17/2022, advised patient that pharmacy will contact them the morning of delivery.   Recent blood pressure readings are as follows: No readings at this time  Recent blood glucose readings are as follows: No readings at this time  Annual wellness visit in last year? Yes 06/24/2021 Most Recent BP reading: 128/70 on 02/03/2022   If Diabetic: Most recent A1C reading: 9.3 on 07/21/2021 Last eye exam / retinopathy screening: Up to date Last diabetic foot exam: Up to date   [name], CPP notified   [name], [place] [phone]  Cycle dispensing form sent to [name], PTM for review.

## 2022-02-13 ENCOUNTER — Ambulatory Visit (INDEPENDENT_AMBULATORY_CARE_PROVIDER_SITE_OTHER): Payer: Medicare Other | Admitting: Podiatry

## 2022-02-13 DIAGNOSIS — M79675 Pain in left toe(s): Secondary | ICD-10-CM

## 2022-02-13 DIAGNOSIS — M79674 Pain in right toe(s): Secondary | ICD-10-CM

## 2022-02-13 DIAGNOSIS — B351 Tinea unguium: Secondary | ICD-10-CM | POA: Diagnosis not present

## 2022-02-13 DIAGNOSIS — E0840 Diabetes mellitus due to underlying condition with diabetic neuropathy, unspecified: Secondary | ICD-10-CM | POA: Diagnosis not present

## 2022-02-13 NOTE — Progress Notes (Signed)
  Subjective:  Patient ID: Christian [name], male    DOB: January 19, 1941,  MRN: [id number]  Chief Complaint  Patient presents with   Diabetes    Nail trim    81 y.o. male returns for the above complaint.  Patient presents with thickened elongated dystrophic toenails x9.  Patient has a history of right fifth digit amputation.  He would like to have the nails debrided down is not able to do it himself he denies any other acute complaints.  Objective:  There were no vitals filed for this visit. Podiatric Exam: Vascular: dorsalis pedis and posterior tibial pulses are palpable bilateral. Capillary return is immediate. Temperature gradient is WNL. Skin turgor WNL  Sensorium: Normal Semmes Weinstein monofilament test. Normal tactile sensation bilaterally. Nail Exam: Pt has thick disfigured discolored nails with subungual debris noted bilateral entire nail hallux through fifth toenails except right fifth.  Pain on palpation to the nails. Ulcer Exam: There is no evidence of ulcer or pre-ulcerative changes or infection. Orthopedic Exam: Muscle tone and strength are WNL. No limitations in general ROM. No crepitus or effusions noted.  Skin: No Porokeratosis. No infection or ulcers    Assessment & Plan:   1. Pain due to onychomycosis of toenails of both feet   2. Diabetes mellitus due to underlying condition with diabetic neuropathy, without long-term current use of insulin ([place])     Patient was evaluated and treated and all questions answered.  Onychomycosis with pain  -Nails palliatively debrided as below. -Educated on self-care  Procedure: Nail Debridement Rationale: pain  Type of Debridement: manual, sharp debridement. Instrumentation: Nail nipper, rotary burr. Number of Nails: 9  Procedures and Treatment: Consent by patient was obtained for treatment procedures. The patient understood the discussion of treatment and procedures well. All questions were answered thoroughly reviewed. Debridement  of mycotic and hypertrophic toenails, 1 through 5 bilateral and clearing of subungual debris. No ulceration, no infection noted.  Return Visit-Office Procedure: Patient instructed to return to the office for a follow up visit 3 months for continued evaluation and treatment.  Boneta Lucks, DPM    No follow-ups on file.

## 2022-02-16 ENCOUNTER — Telehealth: Payer: Self-pay

## 2022-02-16 NOTE — Progress Notes (Signed)
    Chronic Care Management Pharmacy Assistant   Name: Christian [name]  MRN: [id number] DOB: 01/03/1941  Reason for Encounter: CCM (Appointment Reminder)  Medications: Outpatient Encounter Medications as of 02/16/2022  Medication Sig   amLODipine (NORVASC) 5 MG tablet TAKE ONE TABLET BY MOUTH ONCE DAILY   atorvastatin (LIPITOR) 80 MG tablet TAKE ONE TABLET BY MOUTH EVERY EVENING   COMFORT EZ PEN NEEDLES 31G X 8 MM MISC USE TO INJECT LEVEMIR DAILY   Continuous Blood Gluc Receiver (FREESTYLE LIBRE 2 READER) DEVI Use with sensors to monitor sugar continuously   Continuous Blood Gluc Sensor (FREESTYLE LIBRE 2 SENSOR) MISC Apply sensor every 14 days to monitor sugar continously   Dulaglutide (TRULICITY) 3 BB/0.4UG SOPN Inject 3 mg as directed once a week.   empagliflozin (JARDIANCE) 10 MG TABS tablet Take 1 tablet (10 mg total) by mouth daily before breakfast.   glipiZIDE (GLUCOTROL) 5 MG tablet TAKE TWO TABLETS BY MOUTH EVERY MORNING and TAKE ONE TABLET BY MOUTH EVERY EVENING   glucose blood (ONETOUCH ULTRA) test strip USE TO check blood glucose daily AS DIRECTED   hydrochlorothiazide (HYDRODIURIL) 12.5 MG tablet TAKE ONE TABLET BY MOUTH ONCE DAILY   insulin degludec (TRESIBA FLEXTOUCH) 200 UNIT/ML FlexTouch Pen Inject 40 Units into the skin daily.   lisinopril (ZESTRIL) 30 MG tablet TAKE ONE TABLET BY MOUTH EVERY EVENING   No facility-administered encounter medications on file as of 02/16/2022.   Christian [name] was contacted to remind of upcoming telephone visit with [name] on 02/19/2022 at 8:45. Patient was reminded to have any blood glucose and blood pressure readings available for review at appointment.   Patient confirmed appointment.  Are you having any problems with your medications? No   Do you have any concerns you like to discuss with the pharmacist? No  CCM referral has been placed prior to visit?  No   Star Rating Drugs: Medication:  Last Fill: Day  Supply Atorvastatin 80 mg 01/14/2022 30 - on hold per [facility] Trulicity 3 mg  PAP Jardiance 10 mg 02/03/2022 90 Glipizide 5 mg  01/14/2022 30 - on hold per [facility] Lisinopril 30 mg 01/14/2022 30 - on hold per [facility]  [name], CPP notified  [name], [place] Assistant [phone]

## 2022-02-19 ENCOUNTER — Ambulatory Visit: Payer: Medicare Other | Admitting: Pharmacist

## 2022-02-19 ENCOUNTER — Telehealth: Payer: Medicare Other

## 2022-02-19 DIAGNOSIS — E785 Hyperlipidemia, unspecified: Secondary | ICD-10-CM

## 2022-02-19 DIAGNOSIS — I10 Essential (primary) hypertension: Secondary | ICD-10-CM

## 2022-02-19 DIAGNOSIS — E1142 Type 2 diabetes mellitus with diabetic polyneuropathy: Secondary | ICD-10-CM

## 2022-02-19 NOTE — Patient Instructions (Signed)
Visit Information  Phone number for Pharmacist: [phone]   Goals Addressed   None     Care Plan : [place]  Updates made by Charlton Haws, [place] since 02/19/2022 12:00 AM     Problem: Hypertension, Hyperlipidemia, and Diabetes   Priority: High     Long-Range Goal: Disease Management   Start Date: 07/18/2020  Expected End Date: 06/04/2022  This Visit's Progress: On track  Recent Progress: On track  Priority: High  Note:   Current Barriers: Unable to achieve control of diabetes   Pharmacist Clinical Goal(s):  Patient will adhere to plan to optimize therapeutic regimen for diabetes as evidenced by report of adherence to recommended medication management changes through collaboration with PharmD and provider.   Interventions: 1:1 collaboration with Tonia Ghent, MD regarding development and update of comprehensive plan of care as evidenced by provider attestation and co-signature Inter-disciplinary care team collaboration (see longitudinal plan of care) Comprehensive medication review performed; medication list updated in electronic medical record   Hypertension (BP goal <130/80) -Controlled - per patient reported home log -Current home readings: 135/81, 138/84, 120/86, 117/81, 120/72, 115/77, 125/80, 120/77, 125/82 -Current treatment: Amlodipine 5 mg daily - Appropriate, Effective, Safe, Accessible Lisinopril 30 mg daily - Appropriate, Effective, Safe, Accessible HCTZ 12.5 mg daily - Appropriate, Effective, Safe, Accessible -Medications previously tried: none -Continue to monitor BP at home daily, document, and provide log at future appointments -Recommended continue current medications   Diabetes (A1c goal <7%) -Uncontrolled, improving - A1c 8.7% (01/2022) improved from >10%, he has received Jardiance from [facility] and restarted it last week, he is tolerating well and glucose is improved per CGM -Reviewed AGP report: 02/06/22 to 12/20/21. Sensor  active: 83%  Time in range (70-180): 63% (goal > 70%)  High (180-250): 27%  Very high (>250): 10%  Low (< 70): 0% (goal < 4%)  GMI: 7.4%; Average glucose: 169  -Previous AGP report: 12/25/21 to 01/07/22. Sensor active: 92%  Time in range (70-180): 49% (goal > 70%)  High (>180): 51% (Very high 18%)  Low (< 70): 0% (goal < 4%)  GMI: 7.9%; Average glucose: 190  -Current medications: Tresiba 200 u/mL 40 units daily - Appropriate, Query Effective Glipizide 5 mg - 2 AM, 1 PM -  Appropriate, Query Effective Jardiance 10 mg daily (PAP)  - Appropriate, Query Effective Trulicity 3 mg weekly (Mon) (PAP) - Appropriate, Query Effective Freestyle Libre 2 (phone) - Appropriate, Effective, Safe, Accessible -Medications previously tried: metformin 500 mg (GI), Levemir -Current meal patterns: high carb diet  -Reviewed AGP report - glucose improved with addition of Jardiance -Recommend to continue current medication  Hyperlipidemia: (LDL goal < 70) -Controlled - LDL 48 (07/2021) at goal -Current treatment: Atorvastatin 80 mg daily - Appropriate, Effective, Safe, Accessible -Medications previously tried: n/a  -Educated on Cholesterol goals; Benefits of statin for ASCVD risk reduction; -Recommended to continue current medication  Patient Goals/Self-Care Activities Patient will: - take medications as prescribed - check glucose daily, document, and provide at future appointments - check blood pressure with abnormal symptoms      Patient verbalizes understanding of instructions and care plan provided today and agrees to view in [place]. Active MyChart status and patient understanding of how to access instructions and care plan via MyChart confirmed with patient.    Telephone follow up appointment with pharmacy team member scheduled for: 3 months  [name], PharmD, [facility] Clinical Pharmacist [place] [facility] [phone]

## 2022-02-19 NOTE — Progress Notes (Signed)
Chronic Care Management  Pharmacy Note  02/19/2022 Name:  THOMAS MABRY MRN:  [id number] DOB:  May 08, 1940  Summary: CCM F/U visit -DM: A1c 8.7% (01/2022) slowly improving from >10% previously -Pt received Jardiance from manufacturers and has restarted it without issue -Reviewed AGP report: 02/06/22 to 12/20/21. Sensor active: 83%  Time in range (70-180): 63% (goal > 70%)  High (180-250): 27%  Very high (>250): 10%  Low (< 70): 0% (goal < 4%)  GMI: 7.4%; Average glucose: 169  Recommendations:  -No med changes; recheck A1c 3 months (after 05/06/22) per PCP   Follow up: -Pharmacist follow up televisit scheduled for 3 months -PCP appt due Jan/Feb 2024    Subjective: KELDEN LAVALLEE is an 81 y.o. year old male who is a primary patient of [name], [name], MD.  The CCM team was consulted for assistance with disease management and care coordination needs.    Engaged with patient by telephone for follow up visit in response to provider referral for pharmacy case management   Consent to Services:  The patient was given information about Chronic Care Management services, agreed to services, and gave verbal consent prior to initiation of services.  Please see initial visit note for detailed documentation.   Patient Care Team: Tonia Ghent, MD as PCP - General (Family Medicine) Charlton Haws, [facility] as Pharmacist (Pharmacist) Jola Schmidt, MD as Consulting Physician (Ophthalmology)  Recent office visits: 02/03/22 Dr [name] OV: A1c 8.7%; reduce Tyler Aas to 40 units. Restart Jardiance.  11/03/21 Dr [name] OV: f/u DM. A1c 9.0%; Increase Trulicity to 3 mg. F/u 3 months.  04/21/21 - Elsie Stain, MD - Pt presented for diabetes follow up. A1c increased from 9.1 to 10.5%. Foot exam normal. Rectal bleeding, refer to GI. Fungal infection, start nystatin. Follow up 3 months.  03/26/21 - CCM diabetes follow up - Pt approved for PAP (Trulicity 3.41 mg weekly and Jardiance 10 mg), Start  Trulicity this week. Improve diabetes diet/exercise.  Recent consult visits: 04/28/21 - NP [place]Smith (GI) - Pt presented for rectal bleeding. Exam showed internal and external hemorrhoids without active bleeding. Use MiraLAX nightly PRN. Apply Desitin. Update CBC. BP 80/50, recheck BP came up to 110/58.   Hospital visits: None since last CCM contact  Objective:  Lab Results  Component Value Date   CREATININE 1.36 02/03/2022   BUN 20 02/03/2022   GFR 49.13 (L) 02/03/2022   GFRNONAA >60 08/22/2020   GFRAA >60 05/17/2016   NA 139 02/03/2022   K 4.3 02/03/2022   CALCIUM 9.2 02/03/2022   CO2 26 02/03/2022   GLUCOSE 226 (H) 02/03/2022    Lab Results  Component Value Date/Time   HGBA1C 8.7 (H) 02/03/2022 10:51 AM   HGBA1C 9.0 (A) 11/03/2021 08:47 AM   HGBA1C 9.3 (H) 07/21/2021 12:49 PM   GFR 49.13 (L) 02/03/2022 10:51 AM   GFR 52.06 (L) 07/21/2021 12:49 PM   MICROALBUR 1.6 01/04/2020 08:02 AM   MICROALBUR 0.7 03/07/2018 09:41 AM    Last diabetic Eye exam:  Lab Results  Component Value Date/Time   HMDIABEYEEXA No Retinopathy 01/12/2022 12:00 AM    Last diabetic Foot exam: 08/08/20  PCP diabetic foot ulcer  Lab Results  Component Value Date   CHOL 123 07/21/2021   HDL 31.90 (L) 07/21/2021   LDLDIRECT 48.0 07/21/2021   TRIG 379.0 (H) 07/21/2021   CHOLHDL 4 07/21/2021       Latest Ref Rng & Units 07/21/2021   12:49 PM 08/18/2020  1:24 PM 01/04/2020    8:02 AM  Hepatic Function  Total Protein 6.0 - 8.3 g/dL 7.0  6.7  7.3   Albumin 3.5 - 5.2 g/dL 4.2  3.3  4.2   AST 0 - 37 U/L _0 ALT 0 - 53 U/L _1 Alk Phosphatase 39 - 117 U/L 94  55  64   Total Bilirubin 0.2 - 1.2 mg/dL 1.0  0.9  0.9     Lab Results  Component Value Date/Time   TSH 3.66 07/21/2021 12:49 PM   TSH 3.337 03/06/2016 03:45 AM       Latest Ref Rng & Units 07/21/2021   12:49 PM 04/28/2021    2:21 PM 08/30/2020    3:00 PM  CBC  WBC 4.0 - 10.5 K/uL 7.7  9.4  9.2   Hemoglobin  13.0 - 17.0 g/dL 13.5  13.7  12.4   Hematocrit 39.0 - 52.0 % 41.1  41.5  38.0   Platelets 150.0 - 400.0 K/uL 205.0  213.0  222    Clinical ASCVD: No  The ASCVD Risk score (Arnett DK, et al., 2019) failed to calculate for the following reasons:   The 2019 ASCVD risk score is only valid for ages 45 to 88       06/24/2021    2:09 PM 04/21/2021   10:02 AM 04/16/2020    9:01 AM  Depression screen PHQ 2/9  Decreased Interest 0 0 0  Down, Depressed, Hopeless 0 0 0  PHQ - 2 Score 0 0 0    Social History   Tobacco Use  Smoking Status Never  Smokeless Tobacco Never   BP Readings from Last 3 Encounters:  02/03/22 128/70  01/02/22 134/82  11/03/21 112/72   Pulse Readings from Last 3 Encounters:  02/03/22 84  01/02/22 76  11/03/21 71   Wt Readings from Last 3 Encounters:  02/03/22 224 lb (101.6 kg)  11/03/21 215 lb (97.5 kg)  07/21/21 210 lb (95.3 kg)   BMI Readings from Last 3 Encounters:  02/03/22 30.38 kg/m  11/03/21 29.16 kg/m  07/21/21 28.48 kg/m    Assessment/Interventions: Review of patient past medical history, allergies, medications, health status, including review of consultants reports, laboratory and other test data, was performed as part of comprehensive evaluation and provision of chronic care management services.   SDOH:  (Social Determinants of Health) assessments and interventions performed: No - done March 2023  [place]: No Food Insecurity (06/24/2021)  Housing: Low Risk  (06/24/2021)  Transportation Needs: No Transportation Needs (06/24/2021)  Alcohol Screen: Low Risk  (06/24/2021)  Depression (PHQ2-9): Low Risk  (06/24/2021)  Financial Resource Strain: Low Risk  (06/24/2021)  Physical Activity: Inactive (06/24/2021)  Social Connections: Moderately Integrated (06/24/2021)  Stress: No Stress Concern Present (06/24/2021)  Tobacco Use: Low Risk  (02/03/2022)    CCM Care Plan  No Known Allergies  Medications Reviewed Today      Reviewed by Charlton Haws, [facility] (Pharmacist) on 02/19/22 at 0903  Med List Status: <None>   Medication Order Taking? Sig Documenting Provider Last Dose Status Informant  amLODipine (NORVASC) 5 MG tablet [id number] Yes TAKE ONE TABLET BY MOUTH ONCE DAILY Tonia Ghent, MD Taking Active   atorvastatin (LIPITOR) 80 MG tablet [id number] Yes TAKE ONE TABLET BY MOUTH EVERY EVENING Tonia Ghent, MD Taking Active   COMFORT [place] PEN NEEDLES 31G X 8 MM [place] [id number] Yes  USE TO INJECT LEVEMIR DAILY Tonia Ghent, MD Taking Active   Continuous Blood Gluc Receiver (FREESTYLE LIBRE 2 READER) DEVI [id number] Yes Use with sensors to monitor sugar continuously Tonia Ghent, MD Taking Active   Continuous Blood Gluc Sensor (FREESTYLE [place] 2 SENSOR) [place] [id number] Yes Apply sensor every 14 days to monitor sugar continously Tonia Ghent, MD Taking Active   Dulaglutide (TRULICITY) 3 TR/7.1HA SOPN [id number] Yes Inject 3 mg as directed once a week. Tonia Ghent, MD Taking Active            Med Note Rolan Bucco Feb 19, 2022  9:01 AM) [facility] [place] Cares PAP  empagliflozin (JARDIANCE) 10 MG TABS tablet [id number] Yes Take 1 tablet (10 mg total) by mouth daily before breakfast. Tonia Ghent, MD Taking Active            Med Note Rolan Bucco Feb 19, 2022  9:01 AM) [name] PAP  glipiZIDE (GLUCOTROL) 5 MG tablet [id number] Yes TAKE TWO TABLETS BY MOUTH EVERY MORNING and TAKE ONE TABLET BY MOUTH EVERY EVENING Tonia Ghent, MD Taking Active   glucose blood (ONETOUCH ULTRA) test strip [id number] Yes USE TO check blood glucose daily AS DIRECTED Tonia Ghent, MD Taking Active   hydrochlorothiazide (HYDRODIURIL) 12.5 MG tablet [id number] Yes TAKE ONE TABLET BY MOUTH ONCE DAILY Tonia Ghent, MD Taking Active   insulin degludec (TRESIBA FLEXTOUCH) 200 UNIT/ML FlexTouch Pen [id number] Yes Inject 40 Units into the skin daily. Tonia Ghent, MD Taking Active   lisinopril  (ZESTRIL) 30 MG tablet [id number] Yes TAKE ONE TABLET BY MOUTH EVERY EVENING Tonia Ghent, MD Taking Active             Patient Active Problem List   Diagnosis Date Noted   Ankle pain 07/23/2021   BRBPR (bright red blood per rectum) 04/23/2021   Lumbar spondylosis 12/26/2020   Osteomyelitis of fifth toe of right foot ([place])    Diabetic foot infection ([place]) 08/18/2020   Diabetic foot ulcer ([place]) 08/11/2020   Healthcare maintenance 01/10/2020   Lower back pain 09/27/2019   Thumb pain 06/25/2019   Medicare annual wellness visit, subsequent 03/12/2018   Advance care planning 03/12/2018   Shoulder pain 02/03/2017   Edema 11/02/2016   Diabetes mellitus with microalbuminuria ([place]) 06/10/2016   HLD (hyperlipidemia) 06/10/2016   DM type 2 with diabetic peripheral neuropathy ([place])    HTN (hypertension)    Accident caused by farm tractor 01/15/2016   Colon polyps 06/24/2013    Immunization History  Administered Date(s) Administered   Fluad Quad(high Dose 65+) 01/05/2019, 01/09/2020, 04/01/2021, 02/03/2022   Influenza,inj,Quad PF,6+ Mos 02/20/2013, 01/03/2014, 02/05/2015, 01/15/2016, 03/26/2016, 02/02/2017   Influenza-Unspecified 04/13/2010, 01/11/2018   PFIZER(Purple Top)SARS-COV-2 Vaccination 05/23/2019, 06/17/2019   PNEUMOCOCCAL CONJUGATE-20 07/23/2021   Pneumococcal Conjugate-13 01/06/2012, 01/03/2014   Pneumococcal Polysaccharide-23 12/19/2012   Td 05/14/2014   Tdap 07/23/2021   Zoster Recombinat (Shingrix) 10/27/2018, 01/31/2019   Zoster, Live 12/19/2014    Conditions to be addressed/monitored:  Hypertension, Hyperlipidemia, and Diabetes  Care Plan : [place]  Updates made by Charlton Haws, [place] since 02/19/2022 12:00 AM     Problem: Hypertension, Hyperlipidemia, and Diabetes   Priority: High     Long-Range Goal: Disease Management   Start Date: 07/18/2020  Expected End Date: 06/04/2022  This Visit's Progress: On track  Recent Progress: On  track  Priority: High  Note:   Current  Barriers: Unable to achieve control of diabetes   Pharmacist Clinical Goal(s):  Patient will adhere to plan to optimize therapeutic regimen for diabetes as evidenced by report of adherence to recommended medication management changes through collaboration with PharmD and provider.   Interventions: 1:1 collaboration with Tonia Ghent, MD regarding development and update of comprehensive plan of care as evidenced by provider attestation and co-signature Inter-disciplinary care team collaboration (see longitudinal plan of care) Comprehensive medication review performed; medication list updated in electronic medical record   Hypertension (BP goal <130/80) -Controlled - per patient reported home log -Current home readings: 135/81, 138/84, 120/86, 117/81, 120/72, 115/77, 125/80, 120/77, 125/82 -Current treatment: Amlodipine 5 mg daily - Appropriate, Effective, Safe, Accessible Lisinopril 30 mg daily - Appropriate, Effective, Safe, Accessible HCTZ 12.5 mg daily - Appropriate, Effective, Safe, Accessible -Medications previously tried: none -Continue to monitor BP at home daily, document, and provide log at future appointments -Recommended continue current medications   Diabetes (A1c goal <7%) -Uncontrolled, improving - A1c 8.7% (01/2022) improved from >10%, he has received Jardiance from [facility] and restarted it last week, he is tolerating well and glucose is improved per CGM -Reviewed AGP report: 02/06/22 to 12/20/21. Sensor active: 83%  Time in range (70-180): 63% (goal > 70%)  High (180-250): 27%  Very high (>250): 10%  Low (< 70): 0% (goal < 4%)  GMI: 7.4%; Average glucose: 169  -Previous AGP report: 12/25/21 to 01/07/22. Sensor active: 92%  Time in range (70-180): 49% (goal > 70%)  High (>180): 51% (Very high 18%)  Low (< 70): 0% (goal < 4%)  GMI: 7.9%; Average glucose: 190  -Current medications: Tresiba 200 u/mL 40 units daily -  Appropriate, Query Effective Glipizide 5 mg - 2 AM, 1 PM -  Appropriate, Query Effective Jardiance 10 mg daily (PAP)  - Appropriate, Query Effective Trulicity 3 mg weekly (Mon) (PAP) - Appropriate, Query Effective Freestyle Libre 2 (phone) - Appropriate, Effective, Safe, Accessible -Medications previously tried: metformin 500 mg (GI), Levemir -Current meal patterns: high carb diet  -Reviewed AGP report - glucose improved with addition of Jardiance -Recommend to continue current medication  Hyperlipidemia: (LDL goal < 70) -Controlled - LDL 48 (07/2021) at goal -Current treatment: Atorvastatin 80 mg daily - Appropriate, Effective, Safe, Accessible -Medications previously tried: n/a  -Educated on Cholesterol goals; Benefits of statin for ASCVD risk reduction; -Recommended to continue current medication  Patient Goals/Self-Care Activities Patient will: - take medications as prescribed - check glucose daily, document, and provide at future appointments - check blood pressure with abnormal symptoms      Medication Assistance: [place] approved through 78/29/56 Trulicity - Lilly Cares approved through 04/12/22  Star Rating Drugs:  Atorvastatin 80 mg - PDC 98% Glipizide 5 mg - PDC 98%             Lisinopril 30 mg - PDC 98%   Medication Access: Within the past 30 days, how often has patient missed a dose of medication? 0 Is a pillbox or other method used to improve adherence? Yes  Factors that may affect medication adherence? no barriers identified Are meds synced by current pharmacy? Yes  Are meds delivered by current pharmacy? Yes  Does patient experience delays in picking up medications due to transportation concerns? No   [facility] Services Reviewed: Is patient disadvantaged to use [facility]?: No  Current Rx insurance plan: [facility] Name and location of Current pharmacy:  [place], [place]  [address] [place] [address] Phone: [phone] Fax: [phone]  30-day packs (last 02/17/22) Prevagen chew 30 take 1 tablet breakfast Glipizide 5 mg - 2 tablets breakfast,1 tablet evening meal  Atorvastatin 60m take 1 tablet evening meal Amlodipine 56mtake 1 tablet breakfast Hydrochlorothiazide 12.64m29make 1 tablet breakfast Lisinopril 63m26mke 1 tablet evening meal   Patient receives from manufacturer Jardiance 10 mg - medication shipped 10/294/80/16licity 0.755.53    [facility]    Care Plan and Follow Up Patient Decision:  Patient agrees to Care Plan and Follow-up.  Follow Up Plan: Telephone follow up appointment with care management team member scheduled for: 3 months  LindCharlene BrookearmD, BCACP Clinical Pharmacist [facility][phone]

## 2022-02-20 ENCOUNTER — Other Ambulatory Visit: Payer: Self-pay | Admitting: Family Medicine

## 2022-02-20 DIAGNOSIS — E1142 Type 2 diabetes mellitus with diabetic polyneuropathy: Secondary | ICD-10-CM

## 2022-03-10 ENCOUNTER — Telehealth: Payer: Self-pay

## 2022-03-10 NOTE — Progress Notes (Addendum)
Chronic Care Management Pharmacy Assistant   Name: Christian [name]  MRN: [id number] DOB: June 13, 1940  Reason for Encounter: CCM (Medication Adherence and Delivery Coordination)  Recent office visits:  None since last CCM contact  Recent consult visits:  None since last CCM contact  Hospital visits:  None in previous 6 months  Medications: Outpatient Encounter Medications as of 03/10/2022  Medication Sig Note   amLODipine (NORVASC) 5 MG tablet TAKE ONE TABLET BY MOUTH ONCE DAILY    atorvastatin (LIPITOR) 80 MG tablet TAKE ONE TABLET BY MOUTH EVERY EVENING    COMFORT EZ PEN NEEDLES 31G X 8 MM MISC USE TO INJECT LEVEMIR DAILY    Continuous Blood Gluc Receiver (FREESTYLE LIBRE 2 READER) [place] Use with sensors to monitor sugar continuously    Continuous Blood Gluc Sensor (FREESTYLE LIBRE 2 SENSOR) MISC USE TO check blood glucose AS DIRECTED AND CHANGE sensor every 14 DAYS    Dulaglutide (TRULICITY) 3 WU/9.8JX SOPN Inject 3 mg as directed once a week. 02/19/2022: Lilly Cares PAP   empagliflozin (JARDIANCE) 10 MG TABS tablet Take 1 tablet (10 mg total) by mouth daily before breakfast. 02/19/2022: [name] PAP   glipiZIDE (GLUCOTROL) 5 MG tablet TAKE TWO TABLETS BY MOUTH EVERY MORNING and TAKE ONE TABLET BY MOUTH EVERY EVENING    glucose blood (ONETOUCH ULTRA) test strip USE TO check blood glucose daily AS DIRECTED    hydrochlorothiazide (HYDRODIURIL) 12.5 MG tablet TAKE ONE TABLET BY MOUTH ONCE DAILY    insulin degludec (TRESIBA FLEXTOUCH) 200 UNIT/ML FlexTouch Pen Inject 40 Units into the skin daily.    lisinopril (ZESTRIL) 30 MG tablet TAKE ONE TABLET BY MOUTH EVERY EVENING    No facility-administered encounter medications on file as of 03/10/2022.   BP Readings from Last 3 Encounters:  02/03/22 128/70  01/02/22 134/82  11/03/21 112/72    Pulse Readings from Last 3 Encounters:  02/03/22 84  01/02/22 76  11/03/21 71    Lab Results  Component Value Date/Time   HGBA1C 8.7 (H)  02/03/2022 10:51 AM   HGBA1C 9.0 (A) 11/03/2021 08:47 AM   HGBA1C 9.3 (H) 07/21/2021 12:49 PM   Lab Results  Component Value Date   CREATININE 1.36 02/03/2022   BUN 20 02/03/2022   GFR 49.13 (L) 02/03/2022   GFRNONAA >60 08/22/2020   GFRAA >60 05/17/2016   NA 139 02/03/2022   K 4.3 02/03/2022   CALCIUM 9.2 02/03/2022   CO2 26 02/03/2022   Last adherence delivery date: 02/17/2022      Patient is due for next adherence delivery on: 03/19/2022  Spoke with patient on 03/10/2022 reviewed medications and coordinated delivery.  This delivery to include: Adherence Packaging   30 days  Packs: Prevagen chew 30 take 1 tablet breakfast Glipizide 5 mg - 2 tablets breakfast,1 tablet evening meal  Atorvastatin '80mg'$  take 1 tablet evening meal Amlodipine '5mg'$  take 1 tablet breakfast Hydrochlorothiazide 12.'5mg'$  take 1 tablet breakfast Lisinopril '30mg'$  take 1 tablet evening meal   Patient declined the following medications this month: Pen Needles - 31g13m - use daily with insulin Sensors  Sent to patient on 01/14/2022 for 100ds Onetouch Ultra Test Strips - Use once daily as directed   Patient receives from manufacturer Jardiance 10 mg - gets from manufacturer Trulicity 09.14mg  -  gets from manufacturer   [id number] - [facility] [place] [facility]  Any concerns about your medications? No   How often  do you forget or accidentally miss a dose? Rarely   Do you use a pillbox? No Is patient in packaging Yes  If yes  What is the date on your next pill pack? 03/10/2022  Any concerns or issues with your packaging? No  No refill request needed.  Confirmed delivery date of 03/19/2022, advised patient that pharmacy will contact them the morning of delivery.   Recent blood pressure readings are as follows: No readings at this time   Recent blood glucose readings are as follows: No readings at this time   Annual wellness visit in last year?  Yes 06/24/2021 Most Recent BP reading: 128/70 on 02/03/2022   If Diabetic: Most recent A1C reading: 9.3 on 07/21/2021 Last eye exam / retinopathy screening: Up to date Last diabetic foot exam: Up to date   Christian [name], CPP notified   Christian [name], [place] [phone]  Cycle dispensing form sent to Christian [name], PTM for review. Next CCM appointment: 05/20/2022

## 2022-03-23 ENCOUNTER — Telehealth: Payer: Self-pay | Admitting: Pharmacist

## 2022-03-23 NOTE — Telephone Encounter (Signed)
Received message from patient that he is having some trouble with Freestyle Libre sensor connection. To troubleshoot I advise calling Freestyle: [phone]

## 2022-03-24 NOTE — Telephone Encounter (Signed)
Patient has been made aware of the phone number.  [name], CPP notified  [name], [place] Clinical Pharmacy Assistant [phone]

## 2022-03-27 ENCOUNTER — Telehealth: Payer: Self-pay | Admitting: Pharmacist

## 2022-03-27 NOTE — Telephone Encounter (Signed)
Patient is currently enrolled in [place] patient assistance program for the medication Jardiance and Trulicity, respectively, until date: 04/12/22 Patient would like to re-enroll in patient assistance for the next calendar year.  Renewal forms were completed on behalf of the patient. Patient has requested to sign forms in the office. Forms will be printed and placed at front desk.  Forms have been printed and placed in front office for patient signatures.  Charlton Haws, [facility]

## 2022-03-27 NOTE — Telephone Encounter (Signed)
Patient has been made aware. He will come in Monday to sign.   [name], CPP notified  [name], [place] Clinical Pharmacy Assistant [phone]

## 2022-04-02 ENCOUNTER — Telehealth: Payer: Self-pay

## 2022-04-02 NOTE — Telephone Encounter (Signed)
Pt called back and having issues with new sensor. He says the app is saying "alarms not available". He says he called [employer] and he was not able to follower their instructions to troubleshoot. He is going to his son's house and try to call again with his help. Pt will call back if they still can't figure it out.

## 2022-04-02 NOTE — Progress Notes (Signed)
Chronic Care Management Pharmacy Assistant   Name: Christian [name]  MRN: [id number] DOB: 26-Mar-1941  Reason for Encounter: CCM (Medication Adherence and Delivery Coordination)  Recent office visits:  None since last CCM contact  Recent consult visits:  None since last CCM contact  Hospital visits:  None in previous 6 months  Medications: Outpatient Encounter Medications as of 04/02/2022  Medication Sig Note   amLODipine (NORVASC) 5 MG tablet TAKE ONE TABLET BY MOUTH ONCE DAILY    atorvastatin (LIPITOR) 80 MG tablet TAKE ONE TABLET BY MOUTH EVERY EVENING    COMFORT EZ PEN NEEDLES 31G X 8 MM MISC USE TO INJECT LEVEMIR DAILY    Continuous Blood Gluc Receiver (FREESTYLE LIBRE 2 READER) [place] Use with sensors to monitor sugar continuously    Continuous Blood Gluc Sensor (FREESTYLE LIBRE 2 SENSOR) MISC USE TO check blood glucose AS DIRECTED AND CHANGE sensor every 14 DAYS    Dulaglutide (TRULICITY) 3 VA/9.1BT SOPN Inject 3 mg as directed once a week. 02/19/2022: Lilly Cares PAP   empagliflozin (JARDIANCE) 10 MG TABS tablet Take 1 tablet (10 mg total) by mouth daily before breakfast. 02/19/2022: [name] PAP   glipiZIDE (GLUCOTROL) 5 MG tablet TAKE TWO TABLETS BY MOUTH EVERY MORNING and TAKE ONE TABLET BY MOUTH EVERY EVENING    glucose blood (ONETOUCH ULTRA) test strip USE TO check blood glucose daily AS DIRECTED    hydrochlorothiazide (HYDRODIURIL) 12.5 MG tablet TAKE ONE TABLET BY MOUTH ONCE DAILY    insulin degludec (TRESIBA FLEXTOUCH) 200 UNIT/ML FlexTouch Pen Inject 40 Units into the skin daily.    lisinopril (ZESTRIL) 30 MG tablet TAKE ONE TABLET BY MOUTH EVERY EVENING    No facility-administered encounter medications on file as of 04/02/2022.   BP Readings from Last 3 Encounters:  02/03/22 128/70  01/02/22 134/82  11/03/21 112/72    Pulse Readings from Last 3 Encounters:  02/03/22 84  01/02/22 76  11/03/21 71    Lab Results  Component Value Date/Time   HGBA1C 8.7 (H)  02/03/2022 10:51 AM   HGBA1C 9.0 (A) 11/03/2021 08:47 AM   HGBA1C 9.3 (H) 07/21/2021 12:49 PM   Lab Results  Component Value Date   CREATININE 1.36 02/03/2022   BUN 20 02/03/2022   GFR 49.13 (L) 02/03/2022   GFRNONAA >60 08/22/2020   GFRAA >60 05/17/2016   NA 139 02/03/2022   K 4.3 02/03/2022   CALCIUM 9.2 02/03/2022   CO2 26 02/03/2022     Last adherence delivery date: 03/19/2022      Patient is due for next adherence delivery on: 04/17/2022  Multiple attempts made to reach patient. Unsuccessful outreach. Will refill based off of last adherence fill.   This delivery to include: Adherence Packaging   30 days  Packs: Prevagen chew 30 take 1 tablet breakfast Glipizide 5 mg - 2 tablets breakfast,1 tablet evening meal  Atorvastatin '80mg'$  take 1 tablet evening meal Amlodipine '5mg'$  take 1 tablet breakfast Hydrochlorothiazide 12.'5mg'$  take 1 tablet breakfast Lisinopril '30mg'$  take 1 tablet evening meal  Vials: Freestyle Sensors Test Strips   Patient declined the following medications this month: Pen Needles - 31g90m - use daily with insulin Sensors   Patient receives from manufacturer Jardiance 10 mg - gets from manufacturer Trulicity 06.60mg  -  gets from manufacturer   [facility] - [place] [facility] No refill request needed.  Delivery scheduled for 04/17/2022. Unable to speak with patient  to confirm date.    Annual wellness visit in last year? Yes 06/24/2021 Most Recent BP reading: 128/70 on 02/03/2022   If Diabetic: Most recent A1C reading: 9.3 on 07/21/2021 Last eye exam / retinopathy screening: Up to date Last diabetic foot exam: Up to date  Cycle dispensing form sent to [name], PTM for review.  Next CCM appointment: 05/20/2022  [name], CPP notified  [name], [place] Clinical Pharmacy Assistant [phone]

## 2022-04-04 ENCOUNTER — Other Ambulatory Visit: Payer: Self-pay | Admitting: Family Medicine

## 2022-04-15 ENCOUNTER — Ambulatory Visit (INDEPENDENT_AMBULATORY_CARE_PROVIDER_SITE_OTHER): Payer: Medicare Other | Admitting: Podiatry

## 2022-04-15 VITALS — BP 136/78

## 2022-04-15 DIAGNOSIS — L97521 Non-pressure chronic ulcer of other part of left foot limited to breakdown of skin: Secondary | ICD-10-CM

## 2022-04-15 DIAGNOSIS — E0840 Diabetes mellitus due to underlying condition with diabetic neuropathy, unspecified: Secondary | ICD-10-CM | POA: Diagnosis not present

## 2022-04-15 MED ORDER — DOXYCYCLINE HYCLATE 100 MG PO TABS
100.0000 mg | ORAL_TABLET | Freq: Two times a day (BID) | ORAL | 0 refills | Status: AC
Start: 2022-04-15 — End: 2022-04-29

## 2022-04-15 NOTE — Progress Notes (Signed)
Subjective:  Patient ID: Christian [name], male    DOB: 1941/02/09,  MRN: [id number]  Chief Complaint  Patient presents with   Foot Ulcer    82 y.o. male presents with the above complaint.  Patient presents with new complaint left submetatarsal 5 plantar wound.  Patient states that he started noticing it over the last few weeks and has progressive gotten worse.  He wanted get it evaluated he is a diabetic.  He denies any other acute complaints.  He is ambulating with regular shoes.   Review of Systems: Negative except as noted in the HPI. Denies N/V/F/Ch.  Past Medical History:  Diagnosis Date   Arthritis    Blood transfusion without reported diagnosis    ? in 2017 after crushing injury    Cataract    right eye removed    Crushing injury of pelvic region    Diabetes mellitus without complication ([place])    Hyperlipidemia    Hypertension     Current Outpatient Medications:    doxycycline (VIBRA-TABS) 100 MG tablet, Take 1 tablet (100 mg total) by mouth 2 (two) times daily for 14 days., Disp: 28 tablet, Rfl: 0   amLODipine (NORVASC) 5 MG tablet, TAKE ONE TABLET BY MOUTH ONCE DAILY, Disp: 180 tablet, Rfl: 2   atorvastatin (LIPITOR) 80 MG tablet, TAKE ONE TABLET BY MOUTH EVERY EVENING, Disp: 90 tablet, Rfl: 2   COMFORT EZ PEN NEEDLES 31G X 8 MM MISC, USE TO INJECT LEVEMIR DAILY, Disp: 100 each, Rfl: 3   Continuous Blood Gluc Receiver (FREESTYLE LIBRE 2 READER) DEVI, Use with sensors to monitor sugar continuously, Disp: 1 each, Rfl: 0   Continuous Blood Gluc Sensor (FREESTYLE LIBRE 2 SENSOR) MISC, USE TO check blood glucose AS DIRECTED AND CHANGE sensor every 14 DAYS, Disp: 2 each, Rfl: 5   Dulaglutide (TRULICITY) 3 FB/5.1WC SOPN, INJECT 3 MG (0.5 ML) UNDER THE SKIN ONCE A WEEK AS DIRECTED, Disp: 2 mL, Rfl: 2   empagliflozin (JARDIANCE) 10 MG TABS tablet, Take 1 tablet (10 mg total) by mouth daily before breakfast., Disp: 90 tablet, Rfl: 3   glipiZIDE (GLUCOTROL) 5 MG tablet, TAKE TWO  TABLETS BY MOUTH EVERY MORNING and TAKE ONE TABLET BY MOUTH EVERY EVENING, Disp: 270 tablet, Rfl: 2   glucose blood (ONETOUCH ULTRA) test strip, USE TO check blood glucose daily AS DIRECTED, Disp: 100 strip, Rfl: 3   hydrochlorothiazide (HYDRODIURIL) 12.5 MG tablet, TAKE ONE TABLET BY MOUTH ONCE DAILY, Disp: 90 tablet, Rfl: 2   insulin degludec (TRESIBA FLEXTOUCH) 200 UNIT/ML FlexTouch Pen, Inject 40 Units into the skin daily., Disp: 15 mL, Rfl: 5   lisinopril (ZESTRIL) 30 MG tablet, TAKE ONE TABLET BY MOUTH EVERY EVENING, Disp: 30 tablet, Rfl: 2  Social History   Tobacco Use  Smoking Status Never  Smokeless Tobacco Never    No Known Allergies Objective:   Vitals:   04/15/22 1615  BP: 136/78   There is no height or weight on file to calculate BMI. Constitutional Well developed. Well nourished.  Vascular Dorsalis pedis pulses palpable bilaterally. Posterior tibial pulses palpable bilaterally. Capillary refill normal to all digits.  No cyanosis or clubbing noted. Pedal hair growth normal.  Neurologic Normal speech. Oriented to person, place, and time. Epicritic sensation to light touch grossly present bilaterally.  Dermatologic Left submetatarsal 5 ulceration limited to the breakdown of the skin.  Mild erythema no purulent drainage noted no probing down to bone noted.  No malodor present  Orthopedic: Normal joint  ROM without pain or crepitus bilaterally. No visible deformities. No bony tenderness.   Radiographs: None Assessment:   1. Foot ulcer, limited to breakdown of skin, left [facility])    Plan:  Patient was evaluated and treated and all questions answered.  Left submetatarsal 5 ulceration limited to the breakdown of the skin -Minimal debridement was carried out as most of the wound is very granular -All questions and concerns were discussed with the patient in extensive detail -Doxycycline was dispensed for skin and soft tissue prophylaxis -Encouraged him to do Betadine  wet-to-dry dressing daily -Patient already has surgical shoe he will place himself in it.  No follow-ups on file.    Left submet 5 ulceration limited to the breakdown of skin doxycycline surgical shoe which she has 1.  Betadine wet-to-dry

## 2022-04-21 ENCOUNTER — Telehealth: Payer: Self-pay

## 2022-04-21 NOTE — Progress Notes (Signed)
Trulicity & Jardiance PAP 3662 Approval  Trulicity patient assistance with [facility] has been approved for 2024. Patient is on auto-refill with home delivery. Patient should expect first shipment of the year to arrive on 04/21/22 or 04/22/22. Patient has been made aware.  Jardiance patient assistance with [facility] has been approved for 2024. [facility] does not offer auto-refill. Patient needs to call two weeks before needing the medication [phone]) for a timely delivery. Medication is to be delivery to his home. Patient can expect first delivery for 2024 in the next 7 days. Patient has been made aware and given the phone number for refills.  [name], PharmD notified  [name], [place] Clinical Pharmacy Assistant [phone]

## 2022-04-23 ENCOUNTER — Ambulatory Visit: Payer: Medicare Other | Admitting: Podiatry

## 2022-04-27 ENCOUNTER — Encounter: Payer: Self-pay | Admitting: Family Medicine

## 2022-04-27 ENCOUNTER — Ambulatory Visit (INDEPENDENT_AMBULATORY_CARE_PROVIDER_SITE_OTHER): Payer: Medicare Other | Admitting: Family Medicine

## 2022-04-27 VITALS — BP 118/62 | HR 73 | Temp 97.8°F | Ht 72.0 in | Wt 218.0 lb

## 2022-04-27 DIAGNOSIS — E1129 Type 2 diabetes mellitus with other diabetic kidney complication: Secondary | ICD-10-CM | POA: Diagnosis not present

## 2022-04-27 DIAGNOSIS — M25529 Pain in unspecified elbow: Secondary | ICD-10-CM | POA: Diagnosis not present

## 2022-04-27 DIAGNOSIS — R809 Proteinuria, unspecified: Secondary | ICD-10-CM | POA: Diagnosis not present

## 2022-04-27 DIAGNOSIS — E1142 Type 2 diabetes mellitus with diabetic polyneuropathy: Secondary | ICD-10-CM

## 2022-04-27 DIAGNOSIS — L97401 Non-pressure chronic ulcer of unspecified heel and midfoot limited to breakdown of skin: Secondary | ICD-10-CM | POA: Diagnosis not present

## 2022-04-27 DIAGNOSIS — E11621 Type 2 diabetes mellitus with foot ulcer: Secondary | ICD-10-CM | POA: Diagnosis not present

## 2022-04-27 LAB — BASIC METABOLIC PANEL
BUN: 28 mg/dL — ABNORMAL HIGH (ref 6–23)
CO2: 23 mEq/L (ref 19–32)
Calcium: 8.8 mg/dL (ref 8.4–10.5)
Chloride: 105 mEq/L (ref 96–112)
Creatinine, Ser: 1.59 mg/dL — ABNORMAL HIGH (ref 0.40–1.50)
GFR: 40.66 mL/min — ABNORMAL LOW (ref >60.00)
Glucose, Bld: 158 mg/dL — ABNORMAL HIGH (ref 70–99)
Potassium: 4 mEq/L (ref 3.5–5.1)
Sodium: 141 mEq/L (ref 135–145)

## 2022-04-27 LAB — HEMOGLOBIN A1C: Hgb A1c MFr Bld: 8.2 % — ABNORMAL HIGH (ref 4.6–6.5)

## 2022-04-27 NOTE — Progress Notes (Signed)
Diabetes:  Using medications without difficulties: yes Hypoglycemic episodes:no Hyperglycemic episodes:no Feet problems: foot ulcer d/w pt.  On doxy.  Has podiatry follow-up pending. Blood Sugars averaging: 100-130 on fingersticks, usually lower on libre meter.   eye exam within last year: yes Labs pending.  Added back jardiance in the meantime.    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  D/w pt about options, specifically padding his elbow.   Meds, vitals, and allergies reviewed.  ROS: Per HPI unless specifically indicated in ROS section   GEN: nad, alert and oriented HEENT: ncat NECK: supple w/o LA CV: rrr. PULM: ctab, no inc wob ABD: soft, +bs EXT: no edema SKIN: no acute rash R elbow w/o bruise or redness.  Healed scar.  Normal ROM.  Not ttp now.   L foot with 3x3 cm superficially ulcerated area, distal 5th MT.  Limited to superficial skin.  Some macerated tissue.

## 2022-04-27 NOTE — Patient Instructions (Addendum)
Finish the antibiotics, limit weight bearing as much as possible and use wet to dry dressings.  Take care.  Glad to see you. Recheck in about 3 months at a yearly physical.  Labs ahead if possible.  Use an elbow pad on the right elbow.

## 2022-04-29 DIAGNOSIS — M25529 Pain in unspecified elbow: Secondary | ICD-10-CM | POA: Insufficient documentation

## 2022-04-29 NOTE — Assessment & Plan Note (Signed)
Discussed using a soft elbow pad and limiting external compression.

## 2022-04-29 NOTE — Assessment & Plan Note (Signed)
See notes on labs.  Continue Trulicity Jardiance and insulin.  Already on ACE inhibitor.

## 2022-04-29 NOTE — Assessment & Plan Note (Signed)
Finish the antibiotics, limit weight bearing as much as possible and use wet to dry dressings.  He is going to follow-up with Dr.

## 2022-05-05 ENCOUNTER — Other Ambulatory Visit: Payer: Self-pay | Admitting: Family Medicine

## 2022-05-06 ENCOUNTER — Ambulatory Visit (INDEPENDENT_AMBULATORY_CARE_PROVIDER_SITE_OTHER): Payer: Medicare Other | Admitting: Podiatry

## 2022-05-06 ENCOUNTER — Telehealth: Payer: Self-pay

## 2022-05-06 DIAGNOSIS — E0840 Diabetes mellitus due to underlying condition with diabetic neuropathy, unspecified: Secondary | ICD-10-CM

## 2022-05-06 DIAGNOSIS — L97521 Non-pressure chronic ulcer of other part of left foot limited to breakdown of skin: Secondary | ICD-10-CM | POA: Diagnosis not present

## 2022-05-06 NOTE — Progress Notes (Signed)
Subjective:  Patient ID: Christian [name], male    DOB: 05-Sep-1940,  MRN: [id number]  Chief Complaint  Patient presents with   Wound Check    82 y.o. male presents with the above complaint.  Patient presents with soft follow-up of left submetatarsal 5 ulceration.  Patient states that it is doing a lot better.  The Betadine has helped considerably.  He is a diabetic.  Review of Systems: Negative except as noted in the HPI. Denies N/V/F/Ch.  Past Medical History:  Diagnosis Date   Arthritis    Blood transfusion without reported diagnosis    ? in 2017 after crushing injury    Cataract    right eye removed    Crushing injury of pelvic region    Diabetes mellitus without complication ([place])    Hyperlipidemia    Hypertension     Current Outpatient Medications:    amLODipine (NORVASC) 5 MG tablet, TAKE ONE TABLET BY MOUTH ONCE DAILY, Disp: 180 tablet, Rfl: 2   atorvastatin (LIPITOR) 80 MG tablet, TAKE ONE TABLET BY MOUTH EVERY EVENING, Disp: 90 tablet, Rfl: 2   COMFORT EZ PEN NEEDLES 31G X 8 MM MISC, USE TO INJECT LEVEMIR DAILY, Disp: 100 each, Rfl: 3   Continuous Blood Gluc Receiver (FREESTYLE LIBRE 2 READER) DEVI, Use with sensors to monitor sugar continuously, Disp: 1 each, Rfl: 0   Continuous Blood Gluc Sensor (FREESTYLE LIBRE 2 SENSOR) MISC, USE TO check blood glucose AS DIRECTED AND CHANGE sensor every 14 DAYS, Disp: 2 each, Rfl: 5   Dulaglutide (TRULICITY) 3 [place]/7.0WC SOPN, INJECT 3 MG (0.5 ML) UNDER THE SKIN ONCE A WEEK AS DIRECTED, Disp: 2 mL, Rfl: 2   empagliflozin (JARDIANCE) 10 MG TABS tablet, Take 1 tablet (10 mg total) by mouth daily before breakfast., Disp: 90 tablet, Rfl: 3   glipiZIDE (GLUCOTROL) 5 MG tablet, TAKE TWO TABLETS BY MOUTH EVERY MORNING and TAKE ONE TABLET BY MOUTH EVERY EVENING, Disp: 270 tablet, Rfl: 2   glucose blood (ONETOUCH ULTRA) test strip, USE TO check blood glucose daily AS DIRECTED, Disp: 100 strip, Rfl: 3   hydrochlorothiazide (HYDRODIURIL) 12.5 MG  tablet, TAKE ONE TABLET BY MOUTH ONCE DAILY, Disp: 90 tablet, Rfl: 2   insulin degludec (TRESIBA FLEXTOUCH) 200 UNIT/ML FlexTouch Pen, Inject 40 Units into the skin daily., Disp: 15 mL, Rfl: 5   lisinopril (ZESTRIL) 30 MG tablet, TAKE ONE TABLET BY MOUTH EVERY EVENING, Disp: 30 tablet, Rfl: 2  Social History   Tobacco Use  Smoking Status Never  Smokeless Tobacco Never    No Known Allergies Objective:   There were no vitals filed for this visit.  There is no height or weight on file to calculate BMI. Constitutional Well developed. Well nourished.  Vascular Dorsalis pedis pulses palpable bilaterally. Posterior tibial pulses palpable bilaterally. Capillary refill normal to all digits.  No cyanosis or clubbing noted. Pedal hair growth normal.  Neurologic Normal speech. Oriented to person, place, and time. Epicritic sensation to light touch grossly present bilaterally.  Dermatologic Left submetatarsal 5 ulceration limited to the breakdown of the skin which is improving and decreasing slowly.  Mild erythema no purulent drainage noted no probing down to bone noted.  No malodor present  Orthopedic: Normal joint ROM without pain or crepitus bilaterally. No visible deformities. No bony tenderness.   Radiographs: None Assessment:   No diagnosis found.  Plan:  Patient was evaluated and treated and all questions answered.  Left submetatarsal 5 ulceration limited to the breakdown of  the skin -Minimal debridement was carried out as most of the wound is very granular -Clinically improving considerably.  Continue Betadine wet-to-dry dressing changes daily.  Continue wearing surgical shoe.  I am hopeful during next conical visit patient wound has completely reepithelialized

## 2022-05-06 NOTE — Progress Notes (Signed)
Care Management & Coordination Services Pharmacy Team  Reason for Encounter: Medication coordination and delivery  Contacted patient to discuss medications and coordinate delivery from [facility]. Spoke with patient on 05/06/2022   Cycle dispensing form sent to [facility] for review.   Last adherence delivery date:  04/17/2022      Patient is due for next adherence delivery on: 05/18/2022  Spoke with patient on 05/06/2022 reviewed medications and coordinated delivery.  This delivery to include: Adherence Packaging   30 days  Packs: Prevagen chew 30 take 1 tablet breakfast Glipizide 5 mg - 2 tablets breakfast,1 tablet evening meal  Atorvastatin '80mg'$  take 1 tablet evening meal Amlodipine '5mg'$  take 1 tablet breakfast Hydrochlorothiazide 12.'5mg'$  take 1 tablet breakfast Lisinopril '30mg'$  take 1 tablet evening meal   Vials: Freestyle Sensors Test Strips   Patient declined the following medications this month: Pen Needles - 31g55m - use daily with insulin   Patient receives from manufacturer Jardiance 10 mg - gets from manufacturer Trulicity 03.87mg  -  gets from manufacturer   Patient receives from [id number] - [facility] will get [facility]  No refill request needed.  Confirmed delivery date of 05/18/2022, advised patient that pharmacy will contact them the morning of delivery.   Any concerns about your medications? No  How often do you forget or accidentally miss a dose? Never  Do you use a pillbox? No  Is patient in packaging Yes  If yes  What is the date on your next pill pack? 05/06/2022  Any concerns or issues with your packaging? No  Date Blood Sugar Blood Pressure Pulse 01/24 119  118/79   80 01/23 107  137/84   77 01/22 101  137/79   78 01/21 104  143/90   60 01/20 109  124/82   66  Chart review: Recent office visits:  04/27/22 GElsie Stain MD DM Abnormal Labs "Your A1c is reasonable at 8.2.  This has gradually  improved.  Your kidney function was slightly worse.  Keep drinking enough water to keep your urine clear or light-colored.  I think it makes sense to recheck in 3 months at a physical as planned."  F/U 3 months  Recent consult visits:  None since last CCM contact  Hospital visits:  None in previous 6 months  Medications: Outpatient Encounter Medications as of 05/06/2022  Medication Sig Note   amLODipine (NORVASC) 5 MG tablet TAKE ONE TABLET BY MOUTH ONCE DAILY    atorvastatin (LIPITOR) 80 MG tablet TAKE ONE TABLET BY MOUTH EVERY EVENING    COMFORT EZ PEN NEEDLES 31G X 8 MM MISC USE TO INJECT LEVEMIR DAILY    Continuous Blood Gluc Receiver (FREESTYLE LIBRE 2 READER) DEVI Use with sensors to monitor sugar continuously    Continuous Blood Gluc Sensor (FREESTYLE LIBRE 2 SENSOR) MISC USE TO check blood glucose AS DIRECTED AND CHANGE sensor every 14 DAYS    Dulaglutide (TRULICITY) 3 MFI/4.3PISOPN INJECT 3 MG (0.5 ML) UNDER THE SKIN ONCE A WEEK AS DIRECTED    empagliflozin (JARDIANCE) 10 MG TABS tablet Take 1 tablet (10 mg total) by mouth daily before breakfast. 02/19/2022: [name] PAP   glipiZIDE (GLUCOTROL) 5 MG tablet TAKE TWO TABLETS BY MOUTH EVERY MORNING and TAKE ONE TABLET BY MOUTH EVERY EVENING    glucose blood (ONETOUCH ULTRA) test strip USE TO check blood glucose daily AS DIRECTED    hydrochlorothiazide (HYDRODIURIL) 12.5 MG tablet TAKE ONE TABLET BY MOUTH  ONCE DAILY    insulin degludec (TRESIBA FLEXTOUCH) 200 UNIT/ML FlexTouch Pen Inject 40 Units into the skin daily.    lisinopril (ZESTRIL) 30 MG tablet TAKE ONE TABLET BY MOUTH EVERY EVENING    No facility-administered encounter medications on file as of 05/06/2022.   BP Readings from Last 3 Encounters:  04/27/22 118/62  04/15/22 136/78  02/03/22 128/70    Pulse Readings from Last 3 Encounters:  04/27/22 73  02/03/22 84  01/02/22 76    Lab Results  Component Value Date/Time   HGBA1C 8.2 (H) 04/27/2022 09:30 AM   HGBA1C 8.7  (H) 02/03/2022 10:51 AM   Lab Results  Component Value Date   CREATININE 1.59 (H) 04/27/2022   BUN 28 (H) 04/27/2022   GFR 40.66 (L) 04/27/2022   GFRNONAA >60 08/22/2020   GFRAA >60 05/17/2016   NA 141 04/27/2022   K 4.0 04/27/2022   CALCIUM 8.8 04/27/2022   CO2 23 04/27/2022    [name], PharmD notified  [name], [place] Clinical Pharmacy Assistant [phone]

## 2022-05-08 DIAGNOSIS — E1142 Type 2 diabetes mellitus with diabetic polyneuropathy: Secondary | ICD-10-CM | POA: Diagnosis not present

## 2022-05-08 NOTE — Addendum Note (Signed)
Addended by: Ellamae Sia on: 05/08/2022 03:55 PM   Modules accepted: Orders

## 2022-05-09 LAB — MICROALBUMIN / CREATININE URINE RATIO
Creatinine, Urine: 111 mg/dL (ref 20–320)
Microalb Creat Ratio: 3 mcg/mg creat (ref <30)
Microalb, Ur: 0.3 mg/dL

## 2022-05-14 ENCOUNTER — Telehealth: Payer: Self-pay

## 2022-05-14 NOTE — Progress Notes (Signed)
Care Management & Coordination Services Pharmacy Team  Reason for Encounter: Appointment Reminder  Contacted patient to confirm telephone appointment with [name], PharmD on 05/20/2022 at 8:45.  Spoke with patient on 05/14/2022   Do you have any problems getting your medications? No  What is your top health concern you would like to discuss at your upcoming visit? None at this time  Have you seen any other providers since your last visit with PCP? Yes  Star Rating Drugs: Medication:                Last Fill:         Day Supply Atorvastatin 80 mg 58/85/02 30  Trulicity 3 mg               PAP Jardiance 10 mg PAP Glipizide 5 mg  04/14/22 30 Lisinopril 30 mg 04/14/22 30  Care Gaps: Annual wellness visit in last year? Yes 06/24/21  If Diabetic: Last eye exam / retinopathy screening: Up to date Last diabetic foot exam: Up to date  [name], PharmD notified  [name], [place] Assistant [phone]

## 2022-05-19 ENCOUNTER — Ambulatory Visit: Payer: Medicare Other | Admitting: Podiatry

## 2022-05-19 DIAGNOSIS — L97521 Non-pressure chronic ulcer of other part of left foot limited to breakdown of skin: Secondary | ICD-10-CM

## 2022-05-19 DIAGNOSIS — E0840 Diabetes mellitus due to underlying condition with diabetic neuropathy, unspecified: Secondary | ICD-10-CM

## 2022-05-19 NOTE — Progress Notes (Signed)
Subjective:  Patient ID: Christian [name], male    DOB: May 12, 1940,  MRN: [id number]  Chief Complaint  Patient presents with   Foot Ulcer    82 y.o. male presents with the above complaint.  Patient presents with soft follow-up of left submetatarsal 5 ulceration.  Patient states that it is doing a lot better.  The Betadine has helped considerably.  He is a diabetic.  Review of Systems: Negative except as noted in the HPI. Denies N/V/F/Ch.  Past Medical History:  Diagnosis Date   Arthritis    Blood transfusion without reported diagnosis    ? in 2017 after crushing injury    Cataract    right eye removed    Crushing injury of pelvic region    Diabetes mellitus without complication ([place])    Hyperlipidemia    Hypertension     Current Outpatient Medications:    amLODipine (NORVASC) 5 MG tablet, TAKE ONE TABLET BY MOUTH ONCE DAILY, Disp: 180 tablet, Rfl: 2   atorvastatin (LIPITOR) 80 MG tablet, TAKE ONE TABLET BY MOUTH EVERY EVENING, Disp: 90 tablet, Rfl: 2   COMFORT EZ PEN NEEDLES 31G X 8 MM MISC, USE TO INJECT LEVEMIR DAILY, Disp: 100 each, Rfl: 3   Continuous Blood Gluc Receiver (FREESTYLE LIBRE 2 READER) DEVI, Use with sensors to monitor sugar continuously, Disp: 1 each, Rfl: 0   Continuous Blood Gluc Sensor (FREESTYLE LIBRE 2 SENSOR) MISC, USE TO check blood glucose AS DIRECTED AND CHANGE sensor every 14 DAYS, Disp: 2 each, Rfl: 5   Dulaglutide (TRULICITY) 3 0000000 SOPN, INJECT 3 MG (0.5 ML) UNDER THE SKIN ONCE A WEEK AS DIRECTED, Disp: 2 mL, Rfl: 2   empagliflozin (JARDIANCE) 10 MG TABS tablet, Take 1 tablet (10 mg total) by mouth daily before breakfast., Disp: 90 tablet, Rfl: 3   glipiZIDE (GLUCOTROL) 5 MG tablet, TAKE TWO TABLETS BY MOUTH EVERY MORNING and TAKE ONE TABLET BY MOUTH EVERY EVENING, Disp: 270 tablet, Rfl: 2   glucose blood (ONETOUCH ULTRA) test strip, USE TO check blood glucose daily AS DIRECTED, Disp: 100 strip, Rfl: 3   hydrochlorothiazide (HYDRODIURIL) 12.5 MG  tablet, TAKE ONE TABLET BY MOUTH ONCE DAILY, Disp: 90 tablet, Rfl: 2   insulin degludec (TRESIBA FLEXTOUCH) 200 UNIT/ML FlexTouch Pen, Inject 40 Units into the skin daily., Disp: 15 mL, Rfl: 5   lisinopril (ZESTRIL) 30 MG tablet, TAKE ONE TABLET BY MOUTH EVERY EVENING, Disp: 30 tablet, Rfl: 2  Social History   Tobacco Use  Smoking Status Never  Smokeless Tobacco Never    No Known Allergies Objective:   There were no vitals filed for this visit.  There is no height or weight on file to calculate BMI. Constitutional Well developed. Well nourished.  Vascular Dorsalis pedis pulses palpable bilaterally. Posterior tibial pulses palpable bilaterally. Capillary refill normal to all digits.  No cyanosis or clubbing noted. Pedal hair growth normal.  Neurologic Normal speech. Oriented to person, place, and time. Epicritic sensation to light touch grossly present bilaterally.  Dermatologic Left submetatarsal 5 ulceration limited to the breakdown of the skin which is improving and decreasing slowly.  No further erythema no purulent drainage noted no probing down to bone noted.  No malodor present  Orthopedic: Normal joint ROM without pain or crepitus bilaterally. No visible deformities. No bony tenderness.   Radiographs: None Assessment:   No diagnosis found.  Plan:  Patient was evaluated and treated and all questions answered.  Left submetatarsal 5 ulceration limited to the breakdown  of the skin -Minimal debridement was carried out as most of the wound is very granular -Clinically improving considerably.  Continue Betadine wet-to-dry dressing changes daily.  Continue wearing surgical shoe.  I am hopeful during next conical visit patient wound has completely reepithelialized

## 2022-05-19 NOTE — Progress Notes (Unsigned)
Care Management & Coordination Services Pharmacy Note  05/20/2022 Name:  Christian [name] MRN:  [id number] DOB:  02/20/1941  Summary: F/U visit -DM: A1c 8.2% (04/2022) slowly improving from >10% previously; CGM report suggest even better control with GMI 7.4% -Reviewed AGP report: 05/07/22 to 05/20/22. Sensor active: 62%  Time in range (70-180): 60% (goal > 70%)  High (180-250): 30%  Very high (>250): 10%  Low (< 70): 0% (goal < 4%)  GMI: 7.4%; Average glucose: 169 -Pt is approved for Jardiance and Trulicity PAP; discussed he must request refills from manufacturer for these   Recommendations:  -No med changes; discussed he must request refills from manufacturer for these   Follow up plan: -Pharmacist follow up televisit scheduled for 3 months -PCP appt 07/27/22    Subjective: Christian [name] is an 82 y.o. year old male who is a primary patient of Christian [name], Christian Rising, MD.  The care coordination team was consulted for assistance with disease management and care coordination needs.    Engaged with patient by telephone for follow up visit.  Recent office visits: 04/27/22 Dr Christian [name] OV: A1c 8.2%. recheck 3 months. Finish abx for Diabetic foot ulcer.   Recent consult visits: Podiatry- 05/19/22, 05/06/22, 04/15/22, 02/13/22  Hospital visits: None in previous 6 months   Objective:  Lab Results  Component Value Date   CREATININE 1.59 (H) 04/27/2022   BUN 28 (H) 04/27/2022   GFR 40.66 (L) 04/27/2022   GFRNONAA >60 08/22/2020   GFRAA >60 05/17/2016   NA 141 04/27/2022   K 4.0 04/27/2022   CALCIUM 8.8 04/27/2022   CO2 23 04/27/2022   GLUCOSE 158 (H) 04/27/2022    Lab Results  Component Value Date/Time   HGBA1C 8.2 (H) 04/27/2022 09:30 AM   HGBA1C 8.7 (H) 02/03/2022 10:51 AM   GFR 40.66 (L) 04/27/2022 09:30 AM   GFR 49.13 (L) 02/03/2022 10:51 AM   MICROALBUR 0.3 05/08/2022 03:55 PM   MICROALBUR 1.6 01/04/2020 08:02 AM    Last diabetic Eye exam:  Lab Results  Component Value Date/Time    HMDIABEYEEXA No Retinopathy 01/12/2022 12:00 AM    Last diabetic Foot exam: No results found for: "HMDIABFOOTEX"   Lab Results  Component Value Date   CHOL 123 07/21/2021   HDL 31.90 (L) 07/21/2021   LDLDIRECT 48.0 07/21/2021   TRIG 379.0 (H) 07/21/2021   CHOLHDL 4 07/21/2021       Latest Ref Rng & Units 07/21/2021   12:49 PM 08/18/2020    1:24 PM 01/04/2020    8:02 AM  Hepatic Function  Total Protein 6.0 - 8.3 g/dL 7.0  6.7  7.3   Albumin 3.5 - 5.2 g/dL 4.2  3.3  4.2   AST 0 - 37 U/L '14  23  26   '$ ALT 0 - 53 U/L '11  18  19   '$ Alk Phosphatase 39 - 117 U/L 94  55  64   Total Bilirubin 0.2 - 1.2 mg/dL 1.0  0.9  0.9     Lab Results  Component Value Date/Time   TSH 3.66 07/21/2021 12:49 PM   TSH 3.337 03/06/2016 03:45 AM       Latest Ref Rng & Units 07/21/2021   12:49 PM 04/28/2021    2:21 PM 08/30/2020    3:00 PM  CBC  WBC 4.0 - 10.5 K/uL 7.7  9.4  9.2   Hemoglobin 13.0 - 17.0 g/dL 13.5  13.7  12.4   Hematocrit 39.0 - 52.0 % 41.1  41.5  38.0   Platelets 150.0 - 400.0 K/uL 205.0  213.0  222     No results found for: "VD25OH", "VITAMINB12"  Clinical ASCVD: No  The ASCVD Risk score (Arnett DK, et al., 2019) failed to calculate for the following reasons:   The 2019 ASCVD risk score is only valid for ages 55 to 24        04/27/2022    8:40 AM 06/24/2021    2:09 PM 04/21/2021   10:02 AM  Depression screen PHQ 2/9  Decreased Interest 0 0 0  Down, Depressed, Hopeless 0 0 0  PHQ - 2 Score 0 0 0  Altered sleeping 0    Tired, decreased energy 0    Change in appetite 0    Feeling bad or failure about yourself  0    Trouble concentrating 0    Moving slowly or fidgety/restless 0    Suicidal thoughts 0    PHQ-9 Score 0    Difficult doing work/chores Somewhat difficult       Social History   Tobacco Use  Smoking Status Never  Smokeless Tobacco Never   BP Readings from Last 3 Encounters:  04/27/22 118/62  04/15/22 136/78  02/03/22 128/70   Pulse Readings from Last  3 Encounters:  04/27/22 73  02/03/22 84  01/02/22 76   Wt Readings from Last 3 Encounters:  04/27/22 218 lb (98.9 kg)  02/03/22 224 lb (101.6 kg)  11/03/21 215 lb (97.5 kg)   BMI Readings from Last 3 Encounters:  04/27/22 29.57 kg/m  02/03/22 30.38 kg/m  11/03/21 29.16 kg/m    No Known Allergies  Medications Reviewed Today     Reviewed by Charlton Haws, [facility] (Pharmacist) on 05/20/22 at [facility]  Med List Status: <None>   Medication Order Taking? Sig Documenting Provider Last Dose Status Informant  amLODipine (NORVASC) 5 MG tablet [id number] Yes TAKE ONE TABLET BY MOUTH ONCE DAILY Tonia Ghent, MD Taking Active   atorvastatin (LIPITOR) 80 MG tablet [id number] Yes TAKE ONE TABLET BY MOUTH EVERY EVENING Tonia Ghent, MD Taking Active   COMFORT EZ PEN NEEDLES 31G X 8 MM MISC [id number] Yes USE TO INJECT LEVEMIR DAILY Tonia Ghent, MD Taking Active   Continuous Blood Gluc Receiver (FREESTYLE LIBRE 2 READER) DEVI [id number] Yes Use with sensors to monitor sugar continuously Tonia Ghent, MD Taking Active   Continuous Blood Gluc Sensor (FREESTYLE LIBRE 2 SENSOR) [place] [id number] Yes USE TO check blood glucose AS DIRECTED AND CHANGE sensor every 14 DAYS Tonia Ghent, MD Taking Active   Dulaglutide (TRULICITY) 3 RC/3.8FM [place] Aid [id number] Yes INJECT 3 MG (0.5 ML) UNDER THE SKIN ONCE A WEEK AS DIRECTED Tonia Ghent, MD Taking Active            Med Note Malena Catholic May 20, 2022  9:04 AM) [facility] Cares PAP  empagliflozin (JARDIANCE) 10 MG TABS tablet [id number] Yes Take 1 tablet (10 mg total) by mouth daily before breakfast. Tonia Ghent, MD Taking Active            Med Note Rolan Bucco Feb 19, 2022  9:01 AM) [name] PAP  glipiZIDE (GLUCOTROL) 5 MG tablet [id number] Yes TAKE TWO TABLETS BY MOUTH EVERY MORNING and TAKE ONE TABLET BY MOUTH EVERY EVENING Tonia Ghent, MD Taking Active   glucose blood (ONETOUCH ULTRA) test strip [id number] Yes  USE TO check blood glucose daily AS DIRECTED  Tonia Ghent, MD Taking Active   hydrochlorothiazide (HYDRODIURIL) 12.5 MG tablet [id number] Yes TAKE ONE TABLET BY MOUTH ONCE DAILY Tonia Ghent, MD Taking Active   insulin degludec (TRESIBA FLEXTOUCH) 200 UNIT/ML FlexTouch Pen [id number] Yes Inject 40 Units into the skin daily. Tonia Ghent, MD Taking Active   lisinopril (ZESTRIL) 30 MG tablet [id number] Yes TAKE ONE TABLET BY MOUTH EVERY EVENING Tonia Ghent, MD Taking Active             SDOH:  (Social Determinants of Health) assessments and interventions performed: Yes SDOH Interventions    Flowsheet Row Care Coordination from 05/20/2022 in [place] Management from 09/24/2020 in [place] at [place] Management from 08/12/2020 in [place] at [place] Management from 07/18/2020 in [place] at [place] Management from 03/05/2020 in [place] at [place] Interventions       Financial Strain Interventions Other (Comment)  Vania Rea, Trulicity PAP [id number]] Intervention Not Indicated Intervention Not Indicated  [Medications affordable] Intervention Not Indicated  [Meds affordable] Intervention Not Indicated       Medication Assistance:  [place] 2024 approved [place] 2024 approved  Medication Access: Within the past 30 days, how often has patient missed a dose of medication? 0 Is a pillbox or other method used to improve adherence? Yes  Factors that may affect medication adherence? no barriers identified Are meds synced by current pharmacy? Yes  Are meds delivered by current pharmacy? Yes  Does patient experience delays in picking up medications due to transportation concerns? No   [facility] Reviewed: Is patient disadvantaged to use [facility]?: No  Current Rx insurance  plan: [place] Name and location of Current pharmacy:  [facility] [place], [place] - [address] [address] [place] [address] Phone: [phone] Fax: [phone]  [facility] - [place], [place] - [place] [address] [place] [address] Phone: [phone] Fax: [phone]  [facility] services reviewed with patient today?: Yes  30-day Packs: (delivery 05/18/22) Prevagen chew 30 take 1 tablet breakfast Glipizide 5 mg - 2 tablets breakfast,1 tablet evening meal  Atorvastatin '80mg'$  take 1 tablet evening meal Amlodipine '5mg'$  take 1 tablet breakfast Hydrochlorothiazide 12.'5mg'$  take 1 tablet breakfast Lisinopril '30mg'$  take 1 tablet evening meal  Others: Jardiance 10 mg - gets from manufacturer Trulicity 9.24 mg  -  gets from [employer] Flextouch 200 Unit - Patient will get from [place]   Compliance/Adherence/Medication fill history: Care Gaps: None  Star-Rating Drugs: Atorvastatin - PDC 100% Jardiance - PAP Glipizide - PDC 100% Lisinopril - PDC 268% Trulicity - PAP    ASSESSMENT / PLAN  Hypertension (BP goal <130/80) -Controlled - per patient reported home log -Current home readings: 135/81, 138/84, 120/86, 117/81, 120/72, 115/77, 125/80, 120/77, 125/82 -Current treatment: Amlodipine 5 mg daily - Appropriate, Effective, Safe, Accessible Lisinopril 30 mg daily - Appropriate, Effective, Safe, Accessible HCTZ 12.5 mg daily - Appropriate, Effective, Safe, Accessible -Medications previously tried: none -Continue to monitor BP at home daily, document, and provide log at future appointments -Recommended continue current medications   Diabetes (A1c goal <7%) -Not ideally controlled- A1c 8.2% (04/2022) improved from >10%, he endorses compliance with meds as prescribed, he is aware that he needs to order Trulicity and Jardiance refills  from manufacturer Reviewed AGP report: 05/07/22 to  05/20/22. Sensor active: 62%  Time in range (70-180): 60% (goal > 70%)  High (180-250): 30%  Very high (>250): 10%  Low (< 70): 0% (goal < 4%)  GMI: 7.4%; Average glucose: 169  -Previous AGP report: 02/06/22 to 12/20/21. Sensor active: 83%  Time in range (70-180): 63% (goal > 70%)  High (180-250): 27%  Very high (>250): 10%  Low (< 70): 0% (goal < 4%)  GMI: 7.4%; Average glucose: 169  -Current medications: Tresiba 200U 40 units daily - Appropriate, Query Effective Glipizide 5 mg - 2 AM, 1 PM -  Appropriate, Query Effective Jardiance 10 mg daily (PAP)  - Appropriate, Query Effective Trulicity 3 mg weekly (Mon) (PAP) - Appropriate, Query Effective Freestyle Libre 2 (phone) - Appropriate, Effective, Safe, Accessible -Medications previously tried: metformin 500 mg (GI), Levemir -Current meal patterns: high carb diet  -Reviewed AGP report - glucose improved with addition of Jardiance -Recommend to continue current medication  Hyperlipidemia: (LDL goal < 70) -Controlled - LDL 48 (07/2021) at goal; he reports bad taste in mouth after atorvastatin  -Current treatment: Atorvastatin 80 mg daily - Appropriate, Effective, Safe, Accessible -Medications previously tried: n/a  -Educated on Cholesterol goals; Benefits of statin for ASCVD risk reduction; -Recommended to continue current medication  Health Maintenance -Vaccine gaps: none   [name], PharmD, BCACP Clinical Pharmacist [facility] at [facility] [phone]

## 2022-05-20 ENCOUNTER — Ambulatory Visit: Payer: Medicare Other | Admitting: Pharmacist

## 2022-05-20 ENCOUNTER — Other Ambulatory Visit: Payer: Self-pay | Admitting: Orthopedic Surgery

## 2022-05-20 DIAGNOSIS — M48061 Spinal stenosis, lumbar region without neurogenic claudication: Secondary | ICD-10-CM

## 2022-05-20 NOTE — Patient Instructions (Signed)
Visit Information  Phone number for Pharmacist: [phone]  Thank you for meeting with me to discuss your medications! Below is a summary of what we talked about during the visit:   Recommendations:  -No med changes; discussed he must request refills from manufacturer for these   Follow up plan: -Pharmacist follow up televisit scheduled for 3 months -PCP appt 07/27/22   [name], PharmD, BCACP Clinical Pharmacist [facility] at [facility] [phone]

## 2022-06-01 ENCOUNTER — Ambulatory Visit
Admission: RE | Admit: 2022-06-01 | Discharge: 2022-06-01 | Disposition: A | Discharge status: Home / Self Care | Payer: Medicare Other | Source: Ambulatory Visit | Attending: Orthopedic Surgery | Admitting: Orthopedic Surgery

## 2022-06-01 DIAGNOSIS — M48061 Spinal stenosis, lumbar region without neurogenic claudication: Secondary | ICD-10-CM

## 2022-06-01 DIAGNOSIS — M47817 Spondylosis without myelopathy or radiculopathy, lumbosacral region: Secondary | ICD-10-CM | POA: Diagnosis not present

## 2022-06-01 MED ORDER — IOPAMIDOL (ISOVUE-M 200) INJECTION 41%
1.0000 mL | Freq: Once | INTRAMUSCULAR | Status: AC
  Administered 2022-06-01: 1 mL via EPIDURAL

## 2022-06-01 MED ORDER — METHYLPREDNISOLONE ACETATE 40 MG/ML INJ SUSP (RADIOLOG
80.0000 mg | Freq: Once | INTRAMUSCULAR | Status: AC
  Administered 2022-06-01: 80 mg via EPIDURAL

## 2022-06-01 NOTE — Discharge Instructions (Signed)

## 2022-06-03 ENCOUNTER — Ambulatory Visit: Payer: Medicare Other | Admitting: Podiatry

## 2022-06-04 DIAGNOSIS — Z85828 Personal history of other malignant neoplasm of skin: Secondary | ICD-10-CM | POA: Diagnosis not present

## 2022-06-04 DIAGNOSIS — D225 Melanocytic nevi of trunk: Secondary | ICD-10-CM | POA: Diagnosis not present

## 2022-06-04 DIAGNOSIS — L821 Other seborrheic keratosis: Secondary | ICD-10-CM | POA: Diagnosis not present

## 2022-06-04 DIAGNOSIS — L814 Other melanin hyperpigmentation: Secondary | ICD-10-CM | POA: Diagnosis not present

## 2022-06-04 DIAGNOSIS — L570 Actinic keratosis: Secondary | ICD-10-CM | POA: Diagnosis not present

## 2022-06-04 DIAGNOSIS — Z08 Encounter for follow-up examination after completed treatment for malignant neoplasm: Secondary | ICD-10-CM | POA: Diagnosis not present

## 2022-06-05 ENCOUNTER — Telehealth: Payer: Self-pay

## 2022-06-05 NOTE — Progress Notes (Signed)
Care Management & Coordination Services Pharmacy Team  Reason for Encounter: Medication Coordination and Delivery  Contacted patient to discuss medications and coordinate delivery from [facility].  Spoke with patient on 06/05/2022   Cycle dispensing form sent to Regan Lemming, PharmD for review.   Last adherence delivery date: 05/18/2022      Patient is due for next adherence delivery on: 06/17/2022  This delivery to include: Adherence Packaging   30 days  Packs: Prevagen chew 30 take 1 tablet breakfast Glipizide 5 mg - 2 tablets breakfast,1 tablet evening meal  Atorvastatin '80mg'$  take 1 tablet evening meal Amlodipine '5mg'$  take 1 tablet breakfast Hydrochlorothiazide 12.'5mg'$  take 1 tablet breakfast Lisinopril '30mg'$  take 1 tablet evening meal   Vials: Freestyle Sensors Test Strips   Patient declined the following medications this month: None declined   Patient receives from manufacturer Jardiance 10 mg - gets from manufacturer Trulicity [id number] mg  -  gets from manufacturer   Patient receives from [place] [address] Unit - Patient will get from [place]   No refill request needed.  Confirmed delivery date of 06/17/2022, advised patient that pharmacy will contact them the morning of delivery.   Any concerns about your medications? No  How often do you forget or accidentally miss a dose? Never  Do you use a pillbox? No  Is patient in packaging Yes  If yes  What is the date on your next pill pack? 06/05/2022  Any concerns or issues with your packaging? No  Recent blood pressure readings are as follows: Was not home for readings.  Recent blood glucose readings are as follows: Was not home for readings.  Chart review: Recent office visits:  None since last contact  Recent consult visits:  None since last contact  Hospital visits:  None in previous 6 months  Medications: Outpatient Encounter Medications as of 06/05/2022  Medication Sig Note    amLODipine (NORVASC) 5 MG tablet TAKE ONE TABLET BY MOUTH ONCE DAILY    atorvastatin (LIPITOR) 80 MG tablet TAKE ONE TABLET BY MOUTH EVERY EVENING    COMFORT EZ PEN NEEDLES 31G X 8 MM MISC USE TO INJECT LEVEMIR DAILY    Continuous Blood Gluc Receiver (FREESTYLE LIBRE 2 READER) DEVI Use with sensors to monitor sugar continuously    Continuous Blood Gluc Sensor (FREESTYLE LIBRE 2 SENSOR) MISC USE TO check blood glucose AS DIRECTED AND CHANGE sensor every 14 DAYS    Dulaglutide (TRULICITY) 3 0000000 SOPN INJECT 3 MG (0.5 ML) UNDER THE SKIN ONCE A WEEK AS DIRECTED 05/20/2022: [employer] Cares PAP   empagliflozin (JARDIANCE) 10 MG TABS tablet Take 1 tablet (10 mg total) by mouth daily before breakfast. 02/19/2022: [name] PAP   glipiZIDE (GLUCOTROL) 5 MG tablet TAKE TWO TABLETS BY MOUTH EVERY MORNING and TAKE ONE TABLET BY MOUTH EVERY EVENING    glucose blood (ONETOUCH ULTRA) test strip USE TO check blood glucose daily AS DIRECTED    hydrochlorothiazide (HYDRODIURIL) 12.5 MG tablet TAKE ONE TABLET BY MOUTH ONCE DAILY    insulin degludec (TRESIBA FLEXTOUCH) 200 UNIT/ML FlexTouch Pen Inject 40 Units into the skin daily.    lisinopril (ZESTRIL) 30 MG tablet TAKE ONE TABLET BY MOUTH EVERY EVENING    No facility-administered encounter medications on file as of 06/05/2022.   BP Readings from Last 3 Encounters:  06/01/22 115/67  04/27/22 118/62  04/15/22 136/78    Pulse Readings from Last 3 Encounters:  06/01/22 74  04/27/22 73  02/03/22 84  Lab Results  Component Value Date/Time   HGBA1C 8.2 (H) 04/27/2022 09:30 AM   HGBA1C 8.7 (H) 02/03/2022 10:51 AM   Lab Results  Component Value Date   CREATININE 1.59 (H) 04/27/2022   BUN 28 (H) 04/27/2022   GFR 40.66 (L) 04/27/2022   GFRNONAA >60 08/22/2020   GFRAA >60 05/17/2016   NA 141 04/27/2022   K 4.0 04/27/2022   CALCIUM 8.8 04/27/2022   CO2 23 04/27/2022   [name], PharmD notified  [name], [place] Clinical Pharmacy  Assistant [phone]

## 2022-06-16 ENCOUNTER — Ambulatory Visit: Payer: Medicare Other | Admitting: Podiatry

## 2022-06-16 DIAGNOSIS — M216X2 Other acquired deformities of left foot: Secondary | ICD-10-CM

## 2022-06-16 DIAGNOSIS — L97521 Non-pressure chronic ulcer of other part of left foot limited to breakdown of skin: Secondary | ICD-10-CM

## 2022-06-16 DIAGNOSIS — E0840 Diabetes mellitus due to underlying condition with diabetic neuropathy, unspecified: Secondary | ICD-10-CM

## 2022-06-16 NOTE — Progress Notes (Signed)
Subjective:  Patient ID: Christian [name], male    DOB: 25-May-1940,  MRN: [id number]  Chief Complaint  Patient presents with   Foot Ulcer    82 y.o. male presents with the above complaint.  Patient presents with soft follow-up of left submetatarsal 5 ulceration.  He states is doing much better.  He is also here to discuss surgical options for preventing recurrence  Review of Systems: Negative except as noted in the HPI. Denies N/V/F/Ch.  Past Medical History:  Diagnosis Date   Arthritis    Blood transfusion without reported diagnosis    ? in 2017 after crushing injury    Cataract    right eye removed    Crushing injury of pelvic region    Diabetes mellitus without complication ([place])    Hyperlipidemia    Hypertension     Current Outpatient Medications:    amLODipine (NORVASC) 5 MG tablet, TAKE ONE TABLET BY MOUTH ONCE DAILY, Disp: 180 tablet, Rfl: 2   atorvastatin (LIPITOR) 80 MG tablet, TAKE ONE TABLET BY MOUTH EVERY EVENING, Disp: 90 tablet, Rfl: 2   COMFORT EZ PEN NEEDLES 31G X 8 MM MISC, USE TO INJECT LEVEMIR DAILY, Disp: 100 each, Rfl: 3   Continuous Blood Gluc Receiver (FREESTYLE LIBRE 2 READER) DEVI, Use with sensors to monitor sugar continuously, Disp: 1 each, Rfl: 0   Continuous Blood Gluc Sensor (FREESTYLE LIBRE 2 SENSOR) MISC, USE TO check blood glucose AS DIRECTED AND CHANGE sensor every 14 DAYS, Disp: 2 each, Rfl: 5   Dulaglutide (TRULICITY) 3 0000000 SOPN, INJECT 3 MG (0.5 ML) UNDER THE SKIN ONCE A WEEK AS DIRECTED, Disp: 2 mL, Rfl: 2   empagliflozin (JARDIANCE) 10 MG TABS tablet, Take 1 tablet (10 mg total) by mouth daily before breakfast., Disp: 90 tablet, Rfl: 3   glipiZIDE (GLUCOTROL) 5 MG tablet, TAKE TWO TABLETS BY MOUTH EVERY MORNING and TAKE ONE TABLET BY MOUTH EVERY EVENING, Disp: 270 tablet, Rfl: 2   glucose blood (ONETOUCH ULTRA) test strip, USE TO check blood glucose daily AS DIRECTED, Disp: 100 strip, Rfl: 3   hydrochlorothiazide (HYDRODIURIL) 12.5 MG  tablet, TAKE ONE TABLET BY MOUTH ONCE DAILY, Disp: 90 tablet, Rfl: 2   insulin degludec (TRESIBA FLEXTOUCH) 200 UNIT/ML FlexTouch Pen, Inject 40 Units into the skin daily., Disp: 15 mL, Rfl: 5   lisinopril (ZESTRIL) 30 MG tablet, TAKE ONE TABLET BY MOUTH EVERY EVENING, Disp: 30 tablet, Rfl: 2  Social History   Tobacco Use  Smoking Status Never  Smokeless Tobacco Never    No Known Allergies Objective:   There were no vitals filed for this visit.  There is no height or weight on file to calculate BMI. Constitutional Well developed. Well nourished.  Vascular Dorsalis pedis pulses palpable bilaterally. Posterior tibial pulses palpable bilaterally. Capillary refill normal to all digits.  No cyanosis or clubbing noted. Pedal hair growth normal.  Neurologic Normal speech. Oriented to person, place, and time. Epicritic sensation to light touch grossly present bilaterally.  Dermatologic Left submetatarsal 5 ulceration completely reepithelialized.  Plantarflexed fifth metatarsal noted.  Orthopedic: Normal joint ROM without pain or crepitus bilaterally. No visible deformities. No bony tenderness.   Radiographs: None Assessment:   No diagnosis found.  Plan:  Patient was evaluated and treated and all questions answered.  Left submetatarsal 5 ulceration limited to the breakdown of the skin with underlying plantarflexed fifth metatarsal -Minimal debridement was carried out as most of the wound is very granular -Wound is mostly reepithelialized.  At this time I discussed with the patient that this may have reoccurred.  He states that he has a history of that therefore like to discuss surgical options for floating osteotomy of the fifth.  I discussed with the patient extensive detail my preoperative intra and postoperative plan of doing a floating osteotomy of the fifth metatarsal.  He states understanding would like to proceed with surgery -Informed surgical risk consent was reviewed and  read aloud to the patient.  I reviewed the films.  I have discussed my findings with the patient in great detail.  I have discussed all risks including but not limited to infection, stiffness, scarring, limp, disability, deformity, damage to blood vessels and nerves, numbness, poor healing, need for braces, arthritis, chronic pain, amputation, death.  All benefits and realistic expectations discussed in great detail.  I have made no promises as to the outcome.  I have provided realistic expectations.  I have offered the patient a 2nd opinion, which they have declined and assured me they preferred to proceed despite the risks   Left submet 5 floating osteotomy  Wound is mostly reepithelialized triple antibiotic and a Band-Aid

## 2022-06-30 ENCOUNTER — Ambulatory Visit (INDEPENDENT_AMBULATORY_CARE_PROVIDER_SITE_OTHER): Payer: Medicare Other

## 2022-06-30 VITALS — Ht 73.0 in | Wt 218.0 lb

## 2022-06-30 DIAGNOSIS — Z0000 Encounter for general adult medical examination without abnormal findings: Secondary | ICD-10-CM

## 2022-06-30 NOTE — Progress Notes (Signed)
I connected with  Christian [name] on 06/30/22 by a audio enabled telemedicine application and verified that I am speaking with the correct person using two identifiers.  Patient Location: Home  Provider Location: Office/Clinic  I discussed the limitations of evaluation and management by telemedicine. The patient expressed understanding and agreed to proceed.  Subjective:   Christian [name] is a 82 y.o. male who presents for Medicare Annual/Subsequent preventive examination.  Review of Systems      Cardiac Risk Factors include: advanced age (>32men, >14 women);hypertension;diabetes mellitus;male gender;sedentary lifestyle     Objective:    Today's Vitals   06/30/22 1010  Weight: 218 lb (98.9 kg)  Height: 6\' 1"  (1.854 m)   Body mass index is 28.76 kg/m.     06/30/2022   10:16 AM 06/24/2021    2:05 PM 08/18/2020   10:27 PM 06/03/2016    3:32 PM 05/17/2016    5:43 AM 03/05/2016    5:21 PM 01/17/2016    6:28 PM  Advanced Directives  Does Patient Have a Medical Advance Directive? Yes No No No No No No  Type of Paramedic of [place];Living will        Copy of [place] in Chart? No - copy requested        Would patient like information on creating a medical advance directive?  Yes (MAU/Ambulatory/Procedural Areas - Information given) No - Patient declined  No - Patient declined Yes (Inpatient - patient requests chaplain consult to create a medical advance directive) No - patient declined information    Current Medications (verified) Outpatient Encounter Medications as of 06/30/2022  Medication Sig   amLODipine (NORVASC) 5 MG tablet TAKE ONE TABLET BY MOUTH ONCE DAILY   atorvastatin (LIPITOR) 80 MG tablet TAKE ONE TABLET BY MOUTH EVERY EVENING   Dulaglutide (TRULICITY) 3 DG/6.4QI SOPN INJECT 3 MG (0.5 ML) UNDER THE SKIN ONCE A WEEK AS DIRECTED   empagliflozin (JARDIANCE) 10 MG TABS tablet Take 1 tablet (10 mg total) by mouth daily before  breakfast.   glipiZIDE (GLUCOTROL) 5 MG tablet TAKE TWO TABLETS BY MOUTH EVERY MORNING and TAKE ONE TABLET BY MOUTH EVERY EVENING   hydrochlorothiazide (HYDRODIURIL) 12.5 MG tablet TAKE ONE TABLET BY MOUTH ONCE DAILY   insulin degludec (TRESIBA FLEXTOUCH) 200 UNIT/ML FlexTouch Pen Inject 40 Units into the skin daily.   lisinopril (ZESTRIL) 30 MG tablet TAKE ONE TABLET BY MOUTH EVERY EVENING   COMFORT EZ PEN NEEDLES 31G X 8 MM MISC USE TO INJECT LEVEMIR DAILY   Continuous Blood Gluc Receiver (FREESTYLE LIBRE 2 READER) DEVI Use with sensors to monitor sugar continuously   Continuous Blood Gluc Sensor (FREESTYLE LIBRE 2 SENSOR) MISC USE TO check blood glucose AS DIRECTED AND CHANGE sensor every 14 DAYS   glucose blood (ONETOUCH ULTRA) test strip USE TO check blood glucose daily AS DIRECTED   No facility-administered encounter medications on file as of 06/30/2022.    Allergies (verified) Patient has no known allergies.   History: Past Medical History:  Diagnosis Date   Arthritis    Blood transfusion without reported diagnosis    ? in 2017 after crushing injury    Cataract    right eye removed    Crushing injury of pelvic region    Diabetes mellitus without complication ([place])    Hyperlipidemia    Hypertension    Past Surgical History:  Procedure Laterality Date   ACHILLES TENDON SURGERY Right    AMPUTATION Right  08/21/2020   Procedure: RIGHT FOOT 5TH RAY AMPUTATION;  Surgeon: Newt Minion, MD;  Location: [facility];  Service: Orthopedics;  Laterality: Right;   APPLICATION OF A-CELL OF BACK N/A 02/10/2016   Procedure: APPLICATION OF A-CELL OF sacral wound;  Surgeon: Loel Lofty Dillingham, DO;  Location: [facility];  Service: Plastics;  Laterality: N/A;   APPLICATION OF WOUND VAC N/A 02/10/2016   Procedure: APPLICATION OF WOUND VAC possible;  Surgeon: Loel Lofty Dillingham, DO;  Location: [facility];  Service: Plastics;  Laterality: N/A;   CATARACT EXTRACTION Left 05/28/2021   COLONOSCOPY     I & D  EXTREMITY N/A 01/30/2016   Procedure: IRRIGATION AND DEBRIDEMENT OF THE SACRUM;  Surgeon: Loel Lofty Dillingham, DO;  Location: [facility];  Service: Plastics;  Laterality: N/A;   INCISION AND DRAINAGE OF WOUND N/A 02/10/2016   Procedure: IRRIGATION AND DEBRIDEMENT sacral WOUND;  Surgeon: Loel Lofty Dillingham, DO;  Location: [facility];  Service: Plastics;  Laterality: N/A;   IR GENERIC HISTORICAL  02/11/2016   IR CATHETER TUBE CHANGE 02/11/2016 Aletta Edouard, MD [facility]   IR GENERIC HISTORICAL  03/16/2016   IR CATHETER TUBE CHANGE 03/16/2016 Sandi Mariscal, MD [facility]   IR GENERIC HISTORICAL  05/11/2016   IR CATHETER TUBE CHANGE 05/11/2016 Aletta Edouard, MD [facility]   POLYPECTOMY     SACRO-ILIAC PINNING N/A 01/16/2016   Procedure: Jenetta Downer;  Surgeon: Altamese Bullhead, MD;  Location: [facility];  Service: Orthopedics;  Laterality: N/A;   SUPRAPUBIC CATHETER INSERTION     s/p removal 2018   TONSILLECTOMY     ULNAR NERVE TRANSPOSITION Right    Family History  Problem Relation Age of Onset   Lymphoma Mother    CVA Father    Dementia Father    Diabetes Neg Hx    Prostate cancer Neg Hx    Colon polyps Neg Hx    Colon cancer Neg Hx    Rectal cancer Neg Hx    Stomach cancer Neg Hx    Esophageal cancer Neg Hx    Social History   Socioeconomic History   Marital status: Married    Spouse name: Not on file   Number of children: Not on file   Years of education: Not on file   Highest education level: Not on file  Occupational History   Not on file  Tobacco Use   Smoking status: Never   Smokeless tobacco: Never  Substance and Sexual Activity   Alcohol use: No   Drug use: No   Sexual activity: Not Currently  Other Topics Concern   Not on file  Social History Narrative   Married 2015   Retired, previous mission work   Engineer, materials   Social Determinants of Radio broadcast assistant Strain: [place]  (06/30/2022)   Overall Financial Resource Strain  (CARDIA)    Difficulty of Paying Living Expenses: Not hard at all  Food Insecurity: No Food Insecurity (06/30/2022)   Hunger Vital Sign    Worried About Running Out of Food in the Last Year: Never true    [place] in the Last Year: Never true  Transportation Needs: No Transportation Needs (06/30/2022)   PRAPARE - Hydrologist (Medical): No    Lack of Transportation (Non-Medical): No  Physical Activity: Inactive (06/30/2022)   Exercise Vital Sign    Days of Exercise per Week: 0 days    Minutes of Exercise per Session: 0  min  Stress: No Stress Concern Present (06/30/2022)   [place]    Feeling of Stress : Not at all  Social Connections: Moderately Integrated (06/30/2022)   Social Connection and Isolation Panel [NHANES]    Frequency of Communication with Friends and Family: More than three times a week    Frequency of Social Gatherings with Friends and Family: More than three times a week    Attends Religious Services: More than 4 times per year    Active Member of [employer] or Organizations: No    Attends Music therapist: Never    Marital Status: Married    Tobacco Counseling Counseling given: Not Answered   Clinical Intake:  Pre-visit preparation completed: Yes  Pain : No/denies pain     Nutritional Risks: None Diabetes: Yes CBG done?: No Did pt. bring in CBG monitor from home?: No  How often do you need to have someone help you when you read instructions, pamphlets, or other written materials from your doctor or pharmacy?: 1 - Never  Diabetic?Nutrition Risk Assessment:  Has the patient had any N/V/D within the last 2 months?  No  Does the patient have any non-healing wounds?  No  Has the patient had any unintentional weight loss or weight gain?  No   Diabetes:  Is the patient diabetic?  Yes  If diabetic, was a CBG obtained today?  No  Did the patient  bring in their glucometer from home?  No  How often do you monitor your CBG's? QD.   Financial Strains and Diabetes Management:  Are you having any financial strains with the device, your supplies or your medication? No .  Does the patient want to be seen by Chronic Care Management for management of their diabetes?  No  Would the patient like to be referred to a Nutritionist or for Diabetic Management?  No   Diabetic Exams:  Diabetic Eye Exam: Completed 01/12/22 [facility] Diabetic Foot Exam: Completed 02/04/23  [facility].    Interpreter Needed?: No  Information entered by :: C.Dortha Neighbors LPN   Activities of Daily Living    06/30/2022   10:17 AM  In your present state of health, do you have any difficulty performing the following activities:  Hearing? 0  Vision? 0  Difficulty concentrating or making decisions? 1  Comment Some difficulty remembering information  Walking or climbing stairs? 0  Dressing or bathing? 0  Doing errands, shopping? 0  Preparing Food and eating ? N  Using the Toilet? N  In the past six months, have you accidently leaked urine? Y  Comment Occasionally, sees urologist.  Do you have problems with loss of bowel control? N  Managing your Medications? N  Managing your Finances? N  Housekeeping or managing your Housekeeping? N    Patient Care Team: Tonia Ghent, MD as PCP - General (Family Medicine) Charlton Haws, [facility] as Pharmacist (Pharmacist) Jola Schmidt, MD as Consulting Physician (Ophthalmology)  Indicate any recent Medical Services you may have received from other than [facility] providers in the past year (date may be approximate).     Assessment:   This is a routine wellness examination for Christian [name].  Hearing/Vision screen Hearing Screening - Comments:: aids Vision Screening - Comments:: Readers - [facility]  Dietary issues and exercise activities discussed: Current Exercise Habits: The  patient does not participate in regular exercise at present, Exercise limited by: None identified   Goals Addressed  This Visit's Progress    Patient Stated       No new goals.       Depression Screen    06/30/2022   10:16 AM 04/27/2022    8:40 AM 06/24/2021    2:09 PM 04/21/2021   10:02 AM 04/16/2020    9:01 AM 03/08/2018    8:53 AM 06/03/2016    3:31 PM  PHQ 2/9 Scores  PHQ - 2 Score 0 0 0 0 0 0 0  PHQ- 9 Score  0         Fall Risk    06/30/2022   10:12 AM 04/27/2022    8:39 AM 06/24/2021    2:09 PM 04/21/2021   10:02 AM 04/16/2020    9:01 AM  [place] in the past year? 0 0 0 0 0  Number falls in past yr: 0 0 0 0 0  Injury with Fall? 0 0 0 0 0  Risk for fall due to : No Fall Risks No Fall Risks  History of fall(s)   Follow up Falls prevention discussed;Falls evaluation completed Falls evaluation completed Falls prevention discussed Falls evaluation completed Falls evaluation completed    FALL RISK PREVENTION PERTAINING TO THE HOME:  Any stairs in or around the home? No  If so, are there any without handrails? No  Home free of loose throw rugs in walkways, pet beds, electrical cords, etc? Yes  Adequate lighting in your home to reduce risk of falls? Yes   ASSISTIVE DEVICES UTILIZED TO PREVENT FALLS:  Life alert? No  Use of a cane, walker or w/c? Yes , cane Grab bars in the bathroom? Yes  Shower chair or bench in shower? Yes  Elevated toilet seat or a handicapped toilet? Yes    Cognitive Function:    06/03/2016    3:31 PM  MMSE - Mini Mental State Exam  Orientation to time 5  Orientation to Place 5  Registration 3  Attention/ Calculation 0  Recall 3  Language- name 2 objects 0  Language- repeat 1  Language- follow 3 step command 3  Language- read & follow direction 0  Write a sentence 0  Copy design 0  Total score 20        06/30/2022   10:19 AM 06/24/2021    2:14 PM  6CIT Screen  What Year? 0 points 0 points  What month? 0 points  0 points  What time? 0 points 0 points  Count back from 20 0 points 0 points  Months in reverse 0 points 0 points  Repeat phrase 0 points 0 points  Total Score 0 points 0 points    Immunizations Immunization History  Administered Date(s) Administered   Fluad Quad(high Dose 65+) 01/05/2019, 01/09/2020, 04/01/2021, 02/03/2022   Influenza,inj,Quad PF,6+ Mos 02/20/2013, 01/03/2014, 02/05/2015, 01/15/2016, 03/26/2016, 02/02/2017   Influenza-Unspecified 04/13/2010, 01/11/2018   PFIZER(Purple Top)SARS-COV-2 Vaccination 05/23/2019, 06/17/2019   PNEUMOCOCCAL CONJUGATE-20 07/23/2021   Pneumococcal Conjugate-13 01/06/2012, 01/03/2014   Pneumococcal Polysaccharide-23 12/19/2012   Td 05/14/2014   Tdap 07/23/2021   Zoster Recombinat (Shingrix) 10/27/2018, 01/31/2019   Zoster, Live 12/19/2014    TDAP status: Up to date  Flu Vaccine status: Up to date  Pneumococcal vaccine status: Up to date  Covid-19 vaccine status: Declined, Education has been provided regarding the importance of this vaccine but patient still declined. Advised may receive this vaccine at local pharmacy or Health Dept.or vaccine clinic. Aware to provide a copy of the vaccination record if  obtained from local pharmacy or Health Dept. Verbalized acceptance and understanding.  Qualifies for Shingles Vaccine? Yes   Zostavax completed Yes   Shingrix Completed?: Yes  Screening Tests Health Maintenance  Topic Date Due   HEMOGLOBIN A1C  10/26/2022   OPHTHALMOLOGY EXAM  01/13/2023   FOOT EXAM  02/04/2023   Diabetic kidney evaluation - eGFR measurement  04/28/2023   Diabetic kidney evaluation - Urine ACR  05/09/2023   Medicare Annual Wellness (AWV)  06/30/2023   DTaP/Tdap/Td (3 - Td or Tdap) 07/24/2031   Pneumonia Vaccine 18+ Years old  Completed   INFLUENZA VACCINE  Completed   Zoster Vaccines- Shingrix  Completed   HPV VACCINES  Aged Out   COVID-19 Vaccine  Discontinued    Health Maintenance  There are no preventive  care reminders to display for this patient.   Colorectal cancer screening: No longer required.   Lung Cancer Screening: (Low Dose CT Chest recommended if Age 31-80 years, 30 pack-year currently smoking OR have quit w/in 15years.) does not qualify.   Lung Cancer Screening Referral: no  Additional Screening:  Hepatitis C Screening: does not qualify; Completed no  Vision Screening: Recommended annual ophthalmology exams for early detection of glaucoma and other disorders of the eye. Is the patient up to date with their annual eye exam?  Yes  Who is the provider or what is the name of the office in which the patient attends annual eye exams? [facility] If pt is not established with a provider, would they like to be referred to a provider to establish care? No .   Dental Screening: Recommended annual dental exams for proper oral hygiene  Community Resource Referral / Chronic Care Management: CRR required this visit?  No   CCM required this visit?  No      Plan:     I have personally reviewed and noted the following in the patient's chart:   Medical and social history Use of alcohol, tobacco or illicit drugs  Current medications and supplements including opioid prescriptions. Patient is not currently taking opioid prescriptions. Functional ability and status Nutritional status Physical activity Advanced directives List of other physicians Hospitalizations, surgeries, and ER visits in previous 12 months Vitals Screenings to include cognitive, depression, and falls Referrals and appointments  In addition, I have reviewed and discussed with patient certain preventive protocols, quality metrics, and best practice recommendations. A written personalized care plan for preventive services as well as general preventive health recommendations were provided to patient.     Lebron Conners, LPN   [id number]   Nurse Notes: none

## 2022-06-30 NOTE — Patient Instructions (Signed)
Christian [name] , Thank you for taking time to come for your Medicare Wellness Visit. I appreciate your ongoing commitment to your health goals. Please review the following plan we discussed and let me know if I can assist you in the future.   These are the goals we discussed:  Goals      Patient Stated     Would like to start going to the gym      Patient Stated     No new goals.     recover from accident     Starting 06/03/2016, I will continue to participate in therapy as ordered and use wound vac as prescribed in an effort to continue with recovery from accident.         This is a list of the screening recommended for you and due dates:  Health Maintenance  Topic Date Due   Hemoglobin A1C  10/26/2022   Eye exam for diabetics  01/13/2023   Complete foot exam   02/04/2023   Yearly kidney function blood test for diabetes  04/28/2023   Yearly kidney health urinalysis for diabetes  05/09/2023   Medicare Annual Wellness Visit  06/30/2023   DTaP/Tdap/Td vaccine (3 - Td or Tdap) 07/24/2031   Pneumonia Vaccine  Completed   Flu Shot  Completed   Zoster (Shingles) Vaccine  Completed   HPV Vaccine  Aged Out   COVID-19 Vaccine  Discontinued    Advanced directives: Please bring a copy of your health care power of attorney and living will to the office to be added to your chart at your convenience.   Conditions/risks identified: Aim for 30 minutes of exercise or brisk walking, 6-8 glasses of water, and 5 servings of fruits and vegetables each day.   Next appointment: Follow up in one year for your annual wellness visit. 07/05/23 @ 8:45 via telephone.  Preventive Care 61 Years and Older, Male  Preventive care refers to lifestyle choices and visits with your health care provider that can promote health and wellness. What does preventive care include? A yearly physical exam. This is also called an annual well check. Dental exams once or twice a year. Routine eye exams. Ask your health care  provider how often you should have your eyes checked. Personal lifestyle choices, including: Daily care of your teeth and gums. Regular physical activity. Eating a healthy diet. Avoiding tobacco and drug use. Limiting alcohol use. Practicing safe sex. Taking low doses of aspirin every day. Taking vitamin and mineral supplements as recommended by your health care provider. What happens during an annual well check? The services and screenings done by your health care provider during your annual well check will depend on your age, overall health, lifestyle risk factors, and family history of disease. Counseling  Your health care provider may ask you questions about your: Alcohol use. Tobacco use. Drug use. Emotional well-being. Home and relationship well-being. Sexual activity. Eating habits. History of falls. Memory and ability to understand (cognition). Work and work Statistician. Screening  You may have the following tests or measurements: Height, weight, and BMI. Blood pressure. Lipid and cholesterol levels. These may be checked every 5 years, or more frequently if you are over 72 years old. Skin check. Lung cancer screening. You may have this screening every year starting at age 58 if you have a 30-pack-year history of smoking and currently smoke or have quit within the past 15 years. Fecal occult blood test (FOBT) of the stool. You may have this test  every year starting at age 42. Flexible sigmoidoscopy or colonoscopy. You may have a sigmoidoscopy every 5 years or a colonoscopy every 10 years starting at age 66. Prostate cancer screening. Recommendations will vary depending on your family history and other risks. Hepatitis C blood test. Hepatitis B blood test. Sexually transmitted disease (STD) testing. Diabetes screening. This is done by checking your blood sugar (glucose) after you have not eaten for a while (fasting). You may have this done every 1-3 years. Abdominal aortic  aneurysm (AAA) screening. You may need this if you are a current or former smoker. Osteoporosis. You may be screened starting at age 2 if you are at high risk. Talk with your health care provider about your test results, treatment options, and if necessary, the need for more tests. Vaccines  Your health care provider may recommend certain vaccines, such as: Influenza vaccine. This is recommended every year. Tetanus, diphtheria, and acellular pertussis (Tdap, Td) vaccine. You may need a Td booster every 10 years. Zoster vaccine. You may need this after age 43. Pneumococcal 13-valent conjugate (PCV13) vaccine. One dose is recommended after age 8. Pneumococcal polysaccharide (PPSV23) vaccine. One dose is recommended after age 34. Talk to your health care provider about which screenings and vaccines you need and how often you need them. This information is not intended to replace advice given to you by your health care provider. Make sure you discuss any questions you have with your health care provider. Document Released: 04/26/2015 Document Revised: 12/18/2015 Document Reviewed: 01/29/2015 Elsevier Interactive Patient Education  2017 [place] Prevention in the Home Falls can cause injuries. They can happen to people of all ages. There are many things you can do to make your home safe and to help prevent falls. What can I do on the outside of my home? Regularly fix the edges of walkways and driveways and fix any cracks. Remove anything that might make you trip as you walk through a door, such as a raised step or threshold. Trim any bushes or trees on the path to your home. Use bright outdoor lighting. Clear any walking paths of anything that might make someone trip, such as rocks or tools. Regularly check to see if handrails are loose or broken. Make sure that both sides of any steps have handrails. Any raised decks and porches should have guardrails on the edges. Have any leaves,  snow, or ice cleared regularly. Use sand or salt on walking paths during winter. Clean up any spills in your garage right away. This includes oil or grease spills. What can I do in the bathroom? Use night lights. Install grab bars by the toilet and in the tub and shower. Do not use towel bars as grab bars. Use non-skid mats or decals in the tub or shower. If you need to sit down in the shower, use a plastic, non-slip stool. Keep the floor dry. Clean up any water that spills on the floor as soon as it happens. Remove soap buildup in the tub or shower regularly. Attach bath mats securely with double-sided non-slip rug tape. Do not have throw rugs and other things on the floor that can make you trip. What can I do in the bedroom? Use night lights. Make sure that you have a light by your bed that is easy to reach. Do not use any sheets or blankets that are too big for your bed. They should not hang down onto the floor. Have a firm chair that has  side arms. You can use this for support while you get dressed. Do not have throw rugs and other things on the floor that can make you trip. What can I do in the kitchen? Clean up any spills right away. Avoid walking on wet floors. Keep items that you use a lot in easy-to-reach places. If you need to reach something above you, use a strong step stool that has a grab bar. Keep electrical cords out of the way. Do not use floor polish or wax that makes floors slippery. If you must use wax, use non-skid floor wax. Do not have throw rugs and other things on the floor that can make you trip. What can I do with my stairs? Do not leave any items on the stairs. Make sure that there are handrails on both sides of the stairs and use them. Fix handrails that are broken or loose. Make sure that handrails are as long as the stairways. Check any carpeting to make sure that it is firmly attached to the stairs. Fix any carpet that is loose or worn. Avoid having throw  rugs at the top or bottom of the stairs. If you do have throw rugs, attach them to the floor with carpet tape. Make sure that you have a light switch at the top of the stairs and the bottom of the stairs. If you do not have them, ask someone to add them for you. What else can I do to help prevent falls? Wear shoes that: Do not have high heels. Have rubber bottoms. Are comfortable and fit you well. Are closed at the toe. Do not wear sandals. If you use a stepladder: Make sure that it is fully opened. Do not climb a closed stepladder. Make sure that both sides of the stepladder are locked into place. Ask someone to hold it for you, if possible. Clearly mark and make sure that you can see: Any grab bars or handrails. First and last steps. Where the edge of each step is. Use tools that help you move around (mobility aids) if they are needed. These include: Canes. Walkers. Scooters. Crutches. Turn on the lights when you go into a [place] area. Replace any light bulbs as soon as they burn out. Set up your furniture so you have a clear path. Avoid moving your furniture around. If any of your floors are uneven, fix them. If there are any pets around you, be aware of where they are. Review your medicines with your doctor. Some medicines can make you feel dizzy. This can increase your chance of falling. Ask your doctor what other things that you can do to help prevent falls. This information is not intended to replace advice given to you by your health care provider. Make sure you discuss any questions you have with your health care provider. Document Released: 01/24/2009 Document Revised: 09/05/2015 Document Reviewed: 05/04/2014 Elsevier Interactive Patient Education  2017 [employer].

## 2022-07-02 ENCOUNTER — Telehealth: Payer: Self-pay

## 2022-07-02 DIAGNOSIS — I10 Essential (primary) hypertension: Secondary | ICD-10-CM

## 2022-07-02 NOTE — Progress Notes (Signed)
Care Management & Coordination Services Pharmacy Team  Reason for Encounter: Medication Coordination and Delivery  Contacted patient to discuss medications and coordinate delivery from [facility].  Spoke with patient on 07/02/2022   Cycle dispensing form sent to Regan Lemming, PharmD for review.   Last adherence delivery date: 06/17/2022      Patient is due for next adherence delivery on: 07/15/2022  This delivery to include: Adherence Packaging   30 days  Packs: Prevagen chew 30 take 1 tablet breakfast Glipizide 5 mg - 2 tablets breakfast,1 tablet evening meal  Atorvastatin 80mg  take 1 tablet evening meal Amlodipine 5 mg take 1 tablet breakfast Hydrochlorothiazide 12.5mg  take 1 tablet breakfast Lisinopril 30mg  take 1 tablet evening meal   Vials: Freestyle Sensors  Patient declined the following medications this month: None  Refills requested from providers include: Not complete Hydrochlorothiazide 12.5mg  take 1 tablet breakfast  Confirmed delivery date of 07/15/2022, advised patient that pharmacy will contact them the morning of delivery.   Any concerns about your medications? No  How often do you forget or accidentally miss a dose? Never  Do you use a pillbox? No  Is patient in packaging Yes  If yes  What is the date on your next pill pack? 07/02/2022  Any concerns or issues with your packaging? No  Recent blood pressure readings are as follows: Patient was on a tractor during call  Recent blood pressure readings are as follows: Patient was on a tractor during call  Chart review: Recent office visits:  06/30/22 [facility]  Recent consult visits:  06/16/22 Boneta Lucks, DPM (Podiatry) Foot Ulcer Procedure Left submet 5 floating osteotomy   Hospital visits:  None since last contact  Medications: Outpatient Encounter Medications as of 07/02/2022  Medication Sig Note   amLODipine (NORVASC) 5 MG tablet TAKE ONE TABLET BY MOUTH ONCE DAILY    atorvastatin  (LIPITOR) 80 MG tablet TAKE ONE TABLET BY MOUTH EVERY EVENING    COMFORT EZ PEN NEEDLES 31G X 8 MM MISC USE TO INJECT LEVEMIR DAILY    Continuous Blood Gluc Receiver (FREESTYLE LIBRE 2 READER) DEVI Use with sensors to monitor sugar continuously    Continuous Blood Gluc Sensor (FREESTYLE LIBRE 2 SENSOR) MISC USE TO check blood glucose AS DIRECTED AND CHANGE sensor every 14 DAYS    Dulaglutide (TRULICITY) 3 0000000 SOPN INJECT 3 MG (0.5 ML) UNDER THE SKIN ONCE A WEEK AS DIRECTED 05/20/2022: [employer] Cares PAP   empagliflozin (JARDIANCE) 10 MG TABS tablet Take 1 tablet (10 mg total) by mouth daily before breakfast. 02/19/2022: [name] PAP   glipiZIDE (GLUCOTROL) 5 MG tablet TAKE TWO TABLETS BY MOUTH EVERY MORNING and TAKE ONE TABLET BY MOUTH EVERY EVENING    glucose blood (ONETOUCH ULTRA) test strip USE TO check blood glucose daily AS DIRECTED    hydrochlorothiazide (HYDRODIURIL) 12.5 MG tablet TAKE ONE TABLET BY MOUTH ONCE DAILY    insulin degludec (TRESIBA FLEXTOUCH) 200 UNIT/ML FlexTouch Pen Inject 40 Units into the skin daily.    lisinopril (ZESTRIL) 30 MG tablet TAKE ONE TABLET BY MOUTH EVERY EVENING    No facility-administered encounter medications on file as of 07/02/2022.   BP Readings from Last 3 Encounters:  06/01/22 115/67  04/27/22 118/62  04/15/22 136/78    Pulse Readings from Last 3 Encounters:  06/01/22 74  04/27/22 73  02/03/22 84    Lab Results  Component Value Date/Time   HGBA1C 8.2 (H) 04/27/2022 09:30 AM   HGBA1C 8.7 (H) 02/03/2022 10:51  AM   Lab Results  Component Value Date   CREATININE 1.59 (H) 04/27/2022   BUN 28 (H) 04/27/2022   GFR 40.66 (L) 04/27/2022   GFRNONAA >60 08/22/2020   GFRAA >60 05/17/2016   NA 141 04/27/2022   K 4.0 04/27/2022   CALCIUM 8.8 04/27/2022   CO2 23 04/27/2022   [name], CPP notified   [name], [place] Clinical Pharmacy Assistant [phone]

## 2022-07-03 ENCOUNTER — Other Ambulatory Visit: Payer: Self-pay | Admitting: Family Medicine

## 2022-07-03 DIAGNOSIS — E1142 Type 2 diabetes mellitus with diabetic polyneuropathy: Secondary | ICD-10-CM

## 2022-07-03 MED ORDER — HYDROCHLOROTHIAZIDE 12.5 MG PO TABS: 12.5000 mg | ORAL_TABLET | Freq: Every day | ORAL | 0 refills | Status: DC

## 2022-07-03 NOTE — Telephone Encounter (Signed)
Refills needed for hydrochlorothiazide. Pt has physical 07/27/22 next month, refilled 1 month supply so patient does not run out before appt.Marland Kitchen

## 2022-07-03 NOTE — Addendum Note (Signed)
Addended by: Charlton Haws on: 07/03/2022 02:13 PM   Modules accepted: Orders

## 2022-07-05 ENCOUNTER — Other Ambulatory Visit: Payer: Self-pay | Admitting: Family Medicine

## 2022-07-05 DIAGNOSIS — I10 Essential (primary) hypertension: Secondary | ICD-10-CM

## 2022-07-14 ENCOUNTER — Telehealth: Payer: Self-pay | Admitting: Family Medicine

## 2022-07-14 ENCOUNTER — Telehealth: Payer: Self-pay | Admitting: Urology

## 2022-07-14 NOTE — Telephone Encounter (Signed)
DOS - 08/10/22  METATARSAL OSTEOTOMY 5TH LEFT --- 28308  [employer] - 04/13/22   PER [employer] WEBSITE FOR CPT CODE 13086 Notification or Prior Authorization is not required for the requested services You are not required to submit a notification/prior authorization based on the information provided. The number above acknowledges your inquiry and our response. Please write this number down and refer to it for future inquiries. If you still wish to submit your request for review, please select the Continue with Submission button below.   Decision ID #: [id number]

## 2022-07-14 NOTE — Telephone Encounter (Signed)
Patient has appt on 07/27/22 and can bring meter in then. We can not check meters unless patient is here for an OV.

## 2022-07-14 NOTE — Telephone Encounter (Signed)
Christian [name] walked into the office today. He thought he had a lab appointment, but it is next week. He wanted someone to check his BP to see if his meter was accurate as he is concerned because his meter readings have been low, states he checks it two or three times, written down in his notebook. He did not want to make an appointment.

## 2022-07-20 ENCOUNTER — Other Ambulatory Visit (INDEPENDENT_AMBULATORY_CARE_PROVIDER_SITE_OTHER): Payer: Medicare Other

## 2022-07-20 DIAGNOSIS — E1142 Type 2 diabetes mellitus with diabetic polyneuropathy: Secondary | ICD-10-CM

## 2022-07-20 LAB — COMPREHENSIVE METABOLIC PANEL
ALT: 12 U/L (ref 0–53)
AST: 16 U/L (ref 0–37)
Albumin: 4 g/dL (ref 3.5–5.2)
Alkaline Phosphatase: 99 U/L (ref 39–117)
BUN: 21 mg/dL (ref 6–23)
CO2: 25 mEq/L (ref 19–32)
Calcium: 8.9 mg/dL (ref 8.4–10.5)
Chloride: 105 mEq/L (ref 96–112)
Creatinine, Ser: 1.53 mg/dL — ABNORMAL HIGH (ref 0.40–1.50)
GFR: 42.52 mL/min — ABNORMAL LOW (ref >60.00)
Glucose, Bld: 119 mg/dL — ABNORMAL HIGH (ref 70–99)
Potassium: 4.2 mEq/L (ref 3.5–5.1)
Sodium: 141 mEq/L (ref 135–145)
Total Bilirubin: 0.9 mg/dL (ref 0.2–1.2)
Total Protein: 6.6 g/dL (ref 6.0–8.3)

## 2022-07-20 LAB — CBC WITH DIFFERENTIAL/PLATELET
Basophils Absolute: 0.1 10*3/uL (ref 0.0–0.1)
Basophils Relative: 0.8 % (ref 0.0–3.0)
Eosinophils Absolute: 0.3 10*3/uL (ref 0.0–0.7)
Eosinophils Relative: 3.5 % (ref 0.0–5.0)
HCT: 41.9 % (ref 39.0–52.0)
Hemoglobin: 14 g/dL (ref 13.0–17.0)
Lymphocytes Relative: 33.5 % (ref 12.0–46.0)
Lymphs Abs: 2.5 10*3/uL (ref 0.7–4.0)
MCHC: 33.5 g/dL (ref 30.0–36.0)
MCV: 89 fl (ref 78.0–100.0)
Monocytes Absolute: 0.7 10*3/uL (ref 0.1–1.0)
Monocytes Relative: 9.3 % (ref 3.0–12.0)
Neutro Abs: 3.9 10*3/uL (ref 1.4–7.7)
Neutrophils Relative %: 52.9 % (ref 43.0–77.0)
Platelets: 181 10*3/uL (ref 150.0–400.0)
RBC: 4.71 Mil/uL (ref 4.22–5.81)
RDW: 14.7 % (ref 11.5–15.5)
WBC: 7.5 10*3/uL (ref 4.0–10.5)

## 2022-07-20 LAB — LIPID PANEL
Cholesterol: 118 mg/dL (ref 0–200)
HDL: 29 mg/dL — ABNORMAL LOW (ref >39.00)
Total CHOL/HDL Ratio: 4
Triglycerides: 414 mg/dL — ABNORMAL HIGH (ref 0.0–149.0)

## 2022-07-20 LAB — HEMOGLOBIN A1C: Hgb A1c MFr Bld: 8.3 % — ABNORMAL HIGH (ref 4.6–6.5)

## 2022-07-20 LAB — LDL CHOLESTEROL, DIRECT: Direct LDL: 38 mg/dL

## 2022-07-20 LAB — TSH: TSH: 6.31 u[IU]/mL — ABNORMAL HIGH (ref 0.35–5.50)

## 2022-07-27 ENCOUNTER — Ambulatory Visit (INDEPENDENT_AMBULATORY_CARE_PROVIDER_SITE_OTHER): Payer: Medicare Other | Admitting: Family Medicine

## 2022-07-27 ENCOUNTER — Encounter: Payer: Self-pay | Admitting: Family Medicine

## 2022-07-27 VITALS — BP 122/66 | HR 79 | Temp 97.2°F | Ht 73.0 in | Wt 217.0 lb

## 2022-07-27 DIAGNOSIS — I10 Essential (primary) hypertension: Secondary | ICD-10-CM | POA: Diagnosis not present

## 2022-07-27 DIAGNOSIS — Z0000 Encounter for general adult medical examination without abnormal findings: Secondary | ICD-10-CM

## 2022-07-27 DIAGNOSIS — E785 Hyperlipidemia, unspecified: Secondary | ICD-10-CM

## 2022-07-27 DIAGNOSIS — E039 Hypothyroidism, unspecified: Secondary | ICD-10-CM

## 2022-07-27 DIAGNOSIS — E1142 Type 2 diabetes mellitus with diabetic polyneuropathy: Secondary | ICD-10-CM

## 2022-07-27 DIAGNOSIS — Z7189 Other specified counseling: Secondary | ICD-10-CM

## 2022-07-27 NOTE — Patient Instructions (Addendum)
Recheck labs in about 3 months before a visit.  Take care.  Glad to see you. Don't change your meds for now.  I'll await follow up from the foot clinic.

## 2022-07-27 NOTE — Progress Notes (Unsigned)
His wife had multiple falls.  Discussed.    Diabetes:  Using medications without difficulties: yes Hypoglycemic episodes: no Hyperglycemic episodes:rare (at night), corrected with a snack, d/w pt about snack prior to bed.   Feet problems: yes, he has foot surgery pending.   Blood Sugars averaging: 90-150 in the AM eye exam within last year: yes  Hypertension:    Using medication without problems or lightheadedness: yes Chest pain with exertion:no Edema:no Short of breath:no Cr stable at 1.5.  prev 1.1-1.3, until last 2 checks.    Elevated Cholesterol: Using medications without problems:yes Muscle aches: not likely from statin.   Some occ leg cramps at night but not claudication Diet compliance: d/w pt.   Exercise: d/w pt.   TG elevation d/w pt.    TSH elevated.  D/w pt about recheck in a few months.  No neck mass or dysphagia.    Flu 2023 Shingles 2020 PNA up-to-date Tetanus 2016 covid vaccine 2021 RSV vaccine d/w pt.   Colonoscopy 2021 Prostate cancer screening declined.  Discussed with patient. Advance directive-wife and oldest daughter Christian [name] equally designated if patient were incapacitated.  Vital signs, Meds and allergies reviewed.  ROS: Per HPI unless specifically indicated in ROS section   GEN: nad, alert and oriented HEENT: ncat NECK: supple w/o LA CV: rrr PULM: ctab, no inc wob ABD: soft, +bs EXT: no edema SKIN: no acute rash  Diabetic foot exam: Normal inspection No skin breakdown Callous on L 1st and 5th MT.  R 1st MT callous.   Normal DP pulses dec sensation to light tough and monofilament Nails thickened.

## 2022-07-29 DIAGNOSIS — E039 Hypothyroidism, unspecified: Secondary | ICD-10-CM | POA: Insufficient documentation

## 2022-07-29 NOTE — Assessment & Plan Note (Signed)
Continue lisinopril hydrochlorothiazide amlodipine.  Creatinine stable.  Labs discussed with patient.

## 2022-07-29 NOTE — Assessment & Plan Note (Signed)
Flu 2023 Shingles 2020 PNA up-to-date Tetanus 2016 covid vaccine 2021 RSV vaccine d/w pt.   Colonoscopy 2021 Prostate cancer screening declined.  Discussed with patient. Advance directive-wife and oldest daughter [name] equally designated if patient were incapacitated.

## 2022-07-29 NOTE — Assessment & Plan Note (Signed)
Footcare discussed with patient.  He is going to follow-up with the foot clinic.  A1c is stable and I do not want to induce hypoglycemia, especially given his a.m. readings.  Continue Trulicity Jardiance glipizide insulin.  Recheck periodically.  Continue work on diet.

## 2022-07-29 NOTE — Assessment & Plan Note (Signed)
Advance directive-wife and oldest daughter Allison equally designated if patient were incapacitated. ?

## 2022-07-29 NOTE — Assessment & Plan Note (Signed)
Continue atorvastatin.  Continue work on diet and exercise.  Triglyceride elevation discussed with patient.  He may be able to improve that with continue work on diet.

## 2022-07-29 NOTE — Assessment & Plan Note (Signed)
TSH elevated.  D/w pt about recheck in a few months.  No neck mass or dysphagia.  Would not start replacement yet.  He agrees.

## 2022-08-05 ENCOUNTER — Telehealth: Payer: Self-pay

## 2022-08-05 DIAGNOSIS — L821 Other seborrheic keratosis: Secondary | ICD-10-CM | POA: Diagnosis not present

## 2022-08-05 DIAGNOSIS — L570 Actinic keratosis: Secondary | ICD-10-CM | POA: Diagnosis not present

## 2022-08-05 DIAGNOSIS — L814 Other melanin hyperpigmentation: Secondary | ICD-10-CM | POA: Diagnosis not present

## 2022-08-05 NOTE — Progress Notes (Signed)
Care Management & Coordination Services Pharmacy Team  Reason for Encounter: Medication Coordination and Delivery  Contacted patient to discuss medications and coordinate delivery from [facility].  Unsuccessful outreach. Left voicemail for patient to return call.  Cycle dispensing form sent to Chrys Racer, PharmD for review.   Last adherence delivery date: 07/15/2022      Patient is due for next adherence delivery on: 08/13/2022  This delivery to include: Adherence Packaging   30 days  Packs: Prevagen chew 30 take 1 tablet breakfast Glipizide 5 mg - 2 tablets breakfast,1 tablet evening meal  Atorvastatin  take 1 tablet evening meal Amlodipine 5 mg take 1 tablet breakfast Hydrochlorothiazide 12.5mg  take 1 tablet breakfast Lisinopril 30 mg take 1 tablet evening meal Vials: Freestyle Sensors  Refills requested from providers include: Not complete Lisinopril  take 1 tablet evening meal Freestyle Sensors  Delivery scheduled for 08/13/2022. Unable to speak with patient to confirm date.    Chart review: Recent office visits:  07/27/22 Crawford Givens, MD DM No med changes F/U 3 months  Recent consult visits:  None since last contact  Hospital visits:  None in previous 6 months  Medications: Outpatient Encounter Medications as of 08/05/2022  Medication Sig Note   amLODipine (NORVASC) 5 MG tablet TAKE ONE TABLET BY MOUTH ONCE DAILY    atorvastatin (LIPITOR) 80 MG tablet TAKE ONE TABLET BY MOUTH EVERY EVENING    COMFORT EZ PEN NEEDLES 31G X 8 MM MISC USE TO INJECT LEVEMIR DAILY    Continuous Blood Gluc Receiver (FREESTYLE LIBRE 2 READER) DEVI Use with sensors to monitor sugar continuously    Continuous Blood Gluc Sensor (FREESTYLE LIBRE 2 SENSOR) MISC USE TO check blood glucose AS DIRECTED AND CHANGE sensor every 14 DAYS    Dulaglutide (TRULICITY) 3 MG/0.5ML SOPN INJECT 3 MG (0.5 ML) UNDER THE SKIN ONCE A WEEK AS DIRECTED 05/20/2022: [employer] Cares PAP    empagliflozin (JARDIANCE) 10 MG TABS tablet Take 1 tablet (10 mg total) by mouth daily before breakfast. 02/19/2022: [name] PAP   glipiZIDE (GLUCOTROL) 5 MG tablet TAKE TWO TABLETS BY MOUTH EVERY MORNING and TAKE ONE TABLET BY MOUTH EVERY EVENING    glucose blood (ONETOUCH ULTRA) test strip USE TO check blood glucose daily AS DIRECTED    hydrochlorothiazide (HYDRODIURIL) 12.5 MG tablet TAKE ONE TABLET BY MOUTH ONCE DAILY    insulin degludec (TRESIBA FLEXTOUCH) 200 UNIT/ML FlexTouch Pen Inject 40 Units into the skin daily.    lisinopril (ZESTRIL) 30 MG tablet TAKE ONE TABLET BY MOUTH EVERY EVENING    No facility-administered encounter medications on file as of 08/05/2022.   BP Readings from Last 3 Encounters:  07/27/22 122/66  06/01/22 115/67  04/27/22 118/62    Pulse Readings from Last 3 Encounters:  07/27/22 79  06/01/22 74  04/27/22 73    Lab Results  Component Value Date/Time   HGBA1C 8.3 (H) 07/20/2022 08:11 AM   HGBA1C 8.2 (H) 04/27/2022 09:30 AM   Lab Results  Component Value Date   CREATININE 1.53 (H) 07/20/2022   BUN 21 07/20/2022   GFR 42.52 (L) 07/20/2022   GFRNONAA >60 08/22/2020   GFRAA >60 05/17/2016   NA 141 07/20/2022   K 4.2 07/20/2022   CALCIUM 8.9 07/20/2022   CO2 25 07/20/2022   Al Corpus, CPP notified   Claudina Lick, [place] Clinical Pharmacy Assistant [phone]

## 2022-08-10 ENCOUNTER — Encounter: Payer: Self-pay | Admitting: Podiatry

## 2022-08-10 ENCOUNTER — Other Ambulatory Visit: Payer: Self-pay | Admitting: Podiatry

## 2022-08-10 ENCOUNTER — Other Ambulatory Visit: Payer: Self-pay | Admitting: Family Medicine

## 2022-08-10 DIAGNOSIS — M21542 Acquired clubfoot, left foot: Secondary | ICD-10-CM | POA: Diagnosis not present

## 2022-08-10 DIAGNOSIS — E1142 Type 2 diabetes mellitus with diabetic polyneuropathy: Secondary | ICD-10-CM

## 2022-08-10 DIAGNOSIS — M21272 Flexion deformity, left ankle and toes: Secondary | ICD-10-CM | POA: Diagnosis not present

## 2022-08-10 MED ORDER — IBUPROFEN 800 MG PO TABS: 800.0000 mg | ORAL_TABLET | Freq: Four times a day (QID) | ORAL | 1 refills | Status: DC | PRN

## 2022-08-10 MED ORDER — OXYCODONE-ACETAMINOPHEN 5-325 MG PO TABS: 1.0000 | ORAL_TABLET | ORAL | 0 refills | Status: DC | PRN

## 2022-08-13 ENCOUNTER — Telehealth: Payer: Self-pay

## 2022-08-13 NOTE — Progress Notes (Signed)
Care Management & Coordination Services Pharmacy Team  Reason for Encounter: Appointment Reminder  Contacted patient to confirm telephone appointment with Al Corpus, PharmD on 08/19/2022 at 3:00.  Spoke with patient on 08/14/2022   Do you have any problems getting your medications? No  What is your top health concern you would like to discuss at your upcoming visit? No concerns he can think of.   Have you seen any other providers since your last visit with PCP? No  Star Rating Drugs:  Medication:  Last Fill: Day Supply Atorvastatin 80 mg 08/11/2022 30 Jardiance 10 mg Discontinued 02/03/2022 Glipizide 5 mg  08/11/2022 30 Lisinopril 30 mg 08/11/2022 30  Care Gaps: Annual wellness visit in last year? Yes 06/30/2022  If Diabetic: Last eye exam / retinopathy screening: Up to date Last diabetic foot exam: Up to date  Al Corpus, PharmD notified  Claudina Lick, [place] Clinical Pharmacy Assistant [phone]

## 2022-08-19 ENCOUNTER — Ambulatory Visit (INDEPENDENT_AMBULATORY_CARE_PROVIDER_SITE_OTHER): Payer: Medicare Other | Admitting: Podiatry

## 2022-08-19 ENCOUNTER — Ambulatory Visit (INDEPENDENT_AMBULATORY_CARE_PROVIDER_SITE_OTHER): Payer: Medicare Other

## 2022-08-19 ENCOUNTER — Ambulatory Visit: Payer: Medicare Other | Admitting: Pharmacist

## 2022-08-19 DIAGNOSIS — M216X2 Other acquired deformities of left foot: Secondary | ICD-10-CM

## 2022-08-19 DIAGNOSIS — Z9889 Other specified postprocedural states: Secondary | ICD-10-CM

## 2022-08-19 NOTE — Progress Notes (Signed)
Subjective:  Patient ID: Christian [name], male    DOB: 03-18-1941,  MRN: [id number]  Chief Complaint  Patient presents with   Routine Post Op    POV # 1 DOS 08/10/22 --- LEFT 5TH METATARSAL FLOATING OSTEOTOMY    DOS: 08/10/2022 Procedure: Left fifth metatarsal osteotomy  82 y.o. male returns for post-op check.  Patient states that she is doing well.  Denies any other acute complaints bandages clean dry and intact.  Review of Systems: Negative except as noted in the HPI. Denies N/V/F/Ch.  Past Medical History:  Diagnosis Date   Arthritis    Blood transfusion without reported diagnosis    ? in 2017 after crushing injury    Cataract    right eye removed    Crushing injury of pelvic region    Diabetes mellitus without complication (HCC)    Hyperlipidemia    Hypertension     Current Outpatient Medications:    amLODipine (NORVASC) 5 MG tablet, TAKE ONE TABLET BY MOUTH ONCE DAILY, Disp: 180 tablet, Rfl: 2   atorvastatin (LIPITOR) 80 MG tablet, TAKE ONE TABLET BY MOUTH EVERY EVENING, Disp: 90 tablet, Rfl: 2   COMFORT EZ PEN NEEDLES 31G X 8 MM MISC, USE TO INJECT LEVEMIR DAILY, Disp: 100 each, Rfl: 3   Continuous Blood Gluc Receiver (FREESTYLE LIBRE 2 READER) DEVI, Use with sensors to monitor sugar continuously, Disp: 1 each, Rfl: 0   Continuous Glucose Sensor (FREESTYLE LIBRE 2 SENSOR) MISC, USE TO check blood glucose AS DIRECTED AND CHANGE sensor every 14 DAYS, Disp: 2 each, Rfl: 5   Dulaglutide (TRULICITY) 3 MG/0.5ML SOPN, INJECT 3 MG (0.5 ML) UNDER THE SKIN ONCE A WEEK AS DIRECTED, Disp: 2 mL, Rfl: 2   empagliflozin (JARDIANCE) 10 MG TABS tablet, Take 1 tablet (10 mg total) by mouth daily before breakfast., Disp: 90 tablet, Rfl: 3   glipiZIDE (GLUCOTROL) 5 MG tablet, TAKE TWO TABLETS BY MOUTH EVERY MORNING and TAKE ONE TABLET BY MOUTH EVERY EVENING, Disp: 270 tablet, Rfl: 2   glucose blood (ONETOUCH ULTRA) test strip, USE TO check blood glucose daily AS DIRECTED, Disp: 100 strip, Rfl:  3   hydrochlorothiazide (HYDRODIURIL) 12.5 MG tablet, TAKE ONE TABLET BY MOUTH ONCE DAILY, Disp: 90 tablet, Rfl: 2   ibuprofen (ADVIL) 800 MG tablet, Take 1 tablet (800 mg total) by mouth every 6 (six) hours as needed., Disp: 60 tablet, Rfl: 1   insulin degludec (TRESIBA FLEXTOUCH) 200 UNIT/ML FlexTouch Pen, Inject 40 Units into the skin daily., Disp: 15 mL, Rfl: 5   lisinopril (ZESTRIL) 30 MG tablet, TAKE ONE TABLET BY MOUTH EVERY EVENING, Disp: 30 tablet, Rfl: 2   oxyCODONE-acetaminophen (PERCOCET) 5-325 MG tablet, Take 1 tablet by mouth every 4 (four) hours as needed for severe pain., Disp: 30 tablet, Rfl: 0  Social History   Tobacco Use  Smoking Status Never  Smokeless Tobacco Never    No Known Allergies Objective:  There were no vitals filed for this visit. There is no height or weight on file to calculate BMI. Constitutional Well developed. Well nourished.  Vascular Foot warm and well perfused. Capillary refill normal to all digits.   Neurologic Normal speech. Oriented to person, place, and time. Epicritic sensation to light touch grossly present bilaterally.  Dermatologic Skin healing well without signs of infection. Skin edges well coapted without signs of infection.  Orthopedic: Tenderness to palpation noted about the surgical site.   Radiographs: 3 views of skeletally mature adult left foot: Osteotomy noted.  Good correction alignment noted reduction of deformity noted. Assessment:   1. Plantar flexed metatarsal bone of left foot   2. Status post foot surgery    Plan:  Patient was evaluated and treated and all questions answered.  S/p foot surgery left -Progressing as expected post-operatively. -XR: See above -WB Status: Weightbearing as tolerated in surgical shoe -Sutures: Intact.  No signs of Deis is noted no complication noted. -Medications: None -Foot redressed.  No follow-ups on file.

## 2022-08-19 NOTE — Progress Notes (Signed)
Care Management & Coordination Services Pharmacy Note  08/19/2022 Name:  Christian [name] MRN:  [id number] DOB:  Aug 12, 1940  Summary: F/U visit -DM: A1c 8.3% (07/2022) slowly improving from >10% previously; CGM report suggest even better control though information is incomplete due to sensor malfunctions recently -Reviewed AGP report: 07/22/22 to 08/04/22. Sensor active: 36%  Time in range (70-180): 78% (goal > 70%)  High (180-250): 13%  Very high (>250): 8%  Low (< 70): 1% (goal < 4%)  GMI: --%; Average glucose: 147 -Pt is approved for Jardiance and Trulicity PAP; discussed he must request refills from manufacturer for these   Recommendations:  -Advised to contact [employer] to troubleshoot [place] 2 issues; if no help, schedule OV with me  Follow up plan: -Pharmacist follow up televisit scheduled for 3 months -PCP appt 07/27/22    Subjective: Christian [name] is an 82 y.o. year old male who is a primary patient of Para March, Dwana Curd, MD.  The care coordination team was consulted for assistance with disease management and care coordination needs.    Engaged with patient by telephone for follow up visit.  Recent office visits: 07/27/22 Dr Para March OV: A1c 8.3%; recheck 3 months. No change in meds  04/27/22 Dr Para March OV: A1c 8.2%. recheck 3 months. Finish abx for Diabetic foot ulcer.   Recent consult visits: Podiatry- 05/19/22, 05/06/22, 04/15/22, 02/13/22  Hospital visits: None in previous 6 months   Objective:  Lab Results  Component Value Date   CREATININE 1.53 (H) 07/20/2022   BUN 21 07/20/2022   GFR 42.52 (L) 07/20/2022   GFRNONAA >60 08/22/2020   GFRAA >60 05/17/2016   NA 141 07/20/2022   K 4.2 07/20/2022   CALCIUM 8.9 07/20/2022   CO2 25 07/20/2022   GLUCOSE 119 (H) 07/20/2022    Lab Results  Component Value Date/Time   HGBA1C 8.3 (H) 07/20/2022 08:11 AM   HGBA1C 8.2 (H) 04/27/2022 09:30 AM   GFR 42.52 (L) 07/20/2022 08:11 AM   GFR 40.66 (L) 04/27/2022 09:30 AM   MICROALBUR  0.3 05/08/2022 03:55 PM   MICROALBUR 1.6 01/04/2020 08:02 AM    Last diabetic Eye exam:  Lab Results  Component Value Date/Time   HMDIABEYEEXA No Retinopathy 01/12/2022 12:00 AM    Last diabetic Foot exam: No results found for: "HMDIABFOOTEX"   Lab Results  Component Value Date   CHOL 118 07/20/2022   HDL 29.00 (L) 07/20/2022   LDLDIRECT 38.0 07/20/2022   TRIG (H) 07/20/2022    414.0 Triglyceride is over 400; calculations on Lipids are invalid.   CHOLHDL 4 07/20/2022       Latest Ref Rng & Units 07/20/2022    8:11 AM 07/21/2021   12:49 PM 08/18/2020    1:24 PM  Hepatic Function  Total Protein 6.0 - 8.3 g/dL 6.6  7.0  6.7   Albumin 3.5 - 5.2 g/dL 4.0  4.2  3.3   AST 0 - 37 U/L 16  14  23    ALT 0 - 53 U/L 12  11  18    Alk Phosphatase 39 - 117 U/L 99  94  55   Total Bilirubin 0.2 - 1.2 mg/dL 0.9  1.0  0.9     Lab Results  Component Value Date/Time   TSH 6.31 (H) 07/20/2022 08:11 AM   TSH 3.66 07/21/2021 12:49 PM       Latest Ref Rng & Units 07/20/2022    8:11 AM 07/21/2021   12:49 PM 04/28/2021  2:21 PM  CBC  WBC 4.0 - 10.5 K/uL 7.5  7.7  9.4   Hemoglobin 13.0 - 17.0 g/dL 96.2  95.2  84.1   Hematocrit 39.0 - 52.0 % 41.9  41.1  41.5   Platelets 150.0 - 400.0 K/uL 181.0  205.0  213.0     No results found for: "VD25OH", "VITAMINB12"  Clinical ASCVD: No  The ASCVD Risk score (Arnett DK, et al., 2019) failed to calculate for the following reasons:   The 2019 ASCVD risk score is only valid for ages 31 to 25        07/27/2022    9:10 AM 06/30/2022   10:16 AM 04/27/2022    8:40 AM  Depression screen PHQ 2/9  Decreased Interest 0 0 0  Down, Depressed, Hopeless 0 0 0  PHQ - 2 Score 0 0 0  Altered sleeping 0  0  Tired, decreased energy 0  0  Change in appetite 0  0  Feeling bad or failure about yourself  0  0  Trouble concentrating 0  0  Moving slowly or fidgety/restless 0  0  Suicidal thoughts 0  0  PHQ-9 Score 0  0  Difficult doing work/chores Not difficult at  all  Somewhat difficult     Social History   Tobacco Use  Smoking Status Never  Smokeless Tobacco Never   BP Readings from Last 3 Encounters:  07/27/22 122/66  06/01/22 115/67  04/27/22 118/62   Pulse Readings from Last 3 Encounters:  07/27/22 79  06/01/22 74  04/27/22 73   Wt Readings from Last 3 Encounters:  07/27/22 217 lb (98.4 kg)  06/30/22 218 lb (98.9 kg)  04/27/22 218 lb (98.9 kg)   BMI Readings from Last 3 Encounters:  07/27/22 28.63 kg/m  06/30/22 28.76 kg/m  04/27/22 29.57 kg/m    No Known Allergies  Medications Reviewed Today     Reviewed by Kathyrn Sheriff, [facility] (Pharmacist) on 08/19/22 at 1527  Med List Status: <None>   Medication Order Taking? Sig Documenting Provider Last Dose Status Informant  amLODipine (NORVASC) 5 MG tablet [id number] Yes TAKE ONE TABLET BY MOUTH ONCE DAILY Joaquim Nam, MD Taking Active   atorvastatin (LIPITOR) 80 MG tablet [id number] Yes TAKE ONE TABLET BY MOUTH EVERY EVENING Joaquim Nam, MD Taking Active   COMFORT EZ PEN NEEDLES 31G X 8 MM MISC [id number] Yes USE TO INJECT LEVEMIR DAILY Joaquim Nam, MD Taking Active   Continuous Blood Gluc Receiver (FREESTYLE LIBRE 2 READER) DEVI [id number] Yes Use with sensors to monitor sugar continuously Joaquim Nam, MD Taking Active   Continuous Glucose Sensor (FREESTYLE LIBRE 2 SENSOR) [place] [id number] Yes USE TO check blood glucose AS DIRECTED AND CHANGE sensor every 14 DAYS Joaquim Nam, MD Taking Active   Dulaglutide (TRULICITY) 3 MG/0.5ML Namon Cirri [id number] Yes INJECT 3 MG (0.5 ML) UNDER THE SKIN ONCE A WEEK AS DIRECTED Joaquim Nam, MD Taking Active            Med Note Maida Sale May 20, 2022  9:04 AM) [name] Cares PAP  empagliflozin (JARDIANCE) 10 MG TABS tablet [id number] Yes Take 1 tablet (10 mg total) by mouth daily before breakfast. Joaquim Nam, MD Taking Active            Med Note Robyne Askew Feb 19, 2022  9:01 AM) [name]  PAP  glipiZIDE (GLUCOTROL) 5 MG tablet [id number] Yes TAKE  TWO TABLETS BY MOUTH EVERY MORNING and TAKE ONE TABLET BY MOUTH EVERY EVENING Joaquim Nam, MD Taking Active   glucose blood [facility] ULTRA) test strip [id number] Yes USE TO check blood glucose daily AS DIRECTED Joaquim Nam, MD Taking Active   hydrochlorothiazide (HYDRODIURIL) 12.5 MG tablet [id number] Yes TAKE ONE TABLET BY MOUTH ONCE DAILY Joaquim Nam, MD Taking Active   ibuprofen (ADVIL) 800 MG tablet [id number] Yes Take 1 tablet (800 mg total) by mouth every 6 (six) hours as needed. Candelaria Stagers, DPM Taking Active   insulin degludec (TRESIBA FLEXTOUCH) 200 UNIT/ML FlexTouch Pen [id number] Yes Inject 40 Units into the skin daily. Joaquim Nam, MD Taking Active   lisinopril (ZESTRIL) 30 MG tablet [id number] Yes TAKE ONE TABLET BY MOUTH EVERY EVENING Joaquim Nam, MD Taking Active   oxyCODONE-acetaminophen (PERCOCET) 5-325 MG tablet [id number] Yes Take 1 tablet by mouth every 4 (four) hours as needed for severe pain. Candelaria Stagers, DPM Taking Active             SDOH:  (Social Determinants of Health) assessments and interventions performed: Yes SDOH Interventions    Flowsheet Row Clinical Support from 06/30/2022 in [facility] Coordination from 05/20/2022 in [facility] Chronic Care Management from 09/24/2020 in [facility] Chronic Care Management from 08/12/2020 in [facility] Chronic Care Management from 07/18/2020 in [facility] Chronic Care Management from 03/05/2020 in [facility]  SDOH Interventions        Food Insecurity Interventions Intervention Not Indicated -- -- -- -- --  Housing Interventions Intervention Not Indicated -- -- -- -- --  Transportation Interventions Intervention Not Indicated -- -- -- -- --  Utilities  Interventions Intervention Not Indicated -- -- -- -- --  Alcohol Usage Interventions Intervention Not Indicated (Score <7) -- -- -- -- --  Financial Strain Interventions Intervention Not Indicated Other (Comment)  [Jardiance, Trulicity PAP 2024] Intervention Not Indicated Intervention Not Indicated  [Medications affordable] Intervention Not Indicated  [Meds affordable] Intervention Not Indicated  Physical Activity Interventions Patient Refused, Other (Comments), Intervention Not Indicated -- -- -- -- --  Stress Interventions Intervention Not Indicated -- -- -- -- --  Social Connections Interventions Intervention Not Indicated -- -- -- -- --       Medication Assistance:  Trulicity [employer] Cares 2024 approved Carlota Raspberry Cares 2024 approved  Medication Access: Within the past 30 days, how often has patient missed a dose of medication? 0 Is a pillbox or other method used to improve adherence? Yes  Factors that may affect medication adherence? no barriers identified Are meds synced by current pharmacy? Yes  Are meds delivered by current pharmacy? Yes  Does patient experience delays in picking up medications due to transportation concerns? No   [facility] Reviewed: Is patient disadvantaged to use [facility]?: No  Current Rx insurance plan: [facility] Name and location of Current pharmacy:  [facility] [address] [place], [place] - [address] [place] [address] Phone: [phone] Fax: [phone]  [facility] [place], [place] [address] [place] [address] Phone: [phone] Fax: [phone]  [facility] services reviewed with patient today?: Yes  30-day Packs: (delivery 08/13/22) Prevagen chew 30 take 1 tablet  breakfast Glipizide 5 mg - 2 tablets breakfast,1 tablet evening meal  Atorvastatin 80mg  take 1 tablet evening meal Amlodipine 5 mg take 1 tablet  breakfast Hydrochlorothiazide 12.5mg  take 1 tablet breakfast Lisinopril 30 mg take 1 tablet evening meal Vials: [employer]  Others: Jardiance 10 mg - gets from manufacturer Trulicity 0.75 mg  -  gets from [employer] 200 Unit - Patient will get from [place]   Compliance/Adherence/Medication fill history: Care Gaps: None  Star-Rating Drugs: Atorvastatin - PDC 100% Jardiance - PAP Glipizide - PDC 100% Lisinopril - PDC 100% Trulicity - PAP   ASSESSMENT / PLAN  Hypertension (BP goal <130/80) -Controlled - per patient reported home log -Current home readings: 108/73, 115/71, 145/81, 126/74, 126/82, 142/84 -Current treatment: Amlodipine 5 mg daily - Appropriate, Effective, Safe, Accessible Lisinopril 30 mg daily - Appropriate, Effective, Safe, Accessible HCTZ 12.5 mg daily - Appropriate, Effective, Safe, Accessible -Medications previously tried: none -Continue to monitor BP at home daily, document, and provide log at future appointments -Recommended continue current medications   Diabetes (A1c goal <7%) -Not ideally controlled- A1c 8.3% (07/2022) improved from >10%, he endorses compliance with meds as prescribed, he is aware that he needs to order Trulicity and Jardiance refills from manufacturer Reviewed AGP report: 07/22/22 to 08/04/22. Sensor active: 36%  Time in range (70-180): 78% (goal > 70%)  High (180-250): 13%  Very high (>250): 8%  Low (< 70): 1% (goal < 4%)  GMI: --%; Average glucose: 147  Previous AGP report: 05/07/22 to 05/20/22. Sensor active: 62%  Time in range (70-180): 60% (goal > 70%)  High (180-250): 30%  Very high (>250): 10%  Low (< 70): 0% (goal < 4%)  GMI: 7.4%; Average glucose: 169  -Current medications: Tresiba 200U 40 units daily - Appropriate, Query Effective Glipizide 5 mg - 2 AM, 1 PM -  Appropriate, Query Effective Jardiance 10 mg daily (PAP)  - Appropriate, Query Effective Trulicity 3 mg weekly (Mon) (PAP) -  Appropriate, Query Effective [employer] Libre 2 (phone) - Appropriate, Effective, Safe, Accessible -Medications previously tried: metformin 500 mg (GI), Levemir -Current meal patterns: high carb diet  -Reviewed AGP report - glucose improved with addition of Jardiance; discussed calling [employer] for assistance with malfunctions; if they are unable to help, schedule OV with me -Recommend to continue current medication  Hyperlipidemia: (LDL goal < 70) -Controlled - LDL 48 (07/2021) at goal; he reports bad taste in mouth after atorvastatin  -Current treatment: Atorvastatin 80 mg daily - Appropriate, Effective, Safe, Accessible -Medications previously tried: n/a  -Educated on Cholesterol goals; Benefits of statin for ASCVD risk reduction; -Recommended to continue current medication  Health Maintenance -Vaccine gaps: none   Al Corpus, PharmD, Patsy [place], CPP Clinical Pharmacist Practitioner  Healthcare at [facility] [phone]

## 2022-09-02 ENCOUNTER — Ambulatory Visit (INDEPENDENT_AMBULATORY_CARE_PROVIDER_SITE_OTHER): Payer: Medicare Other | Admitting: Podiatry

## 2022-09-02 ENCOUNTER — Telehealth: Payer: Self-pay

## 2022-09-02 DIAGNOSIS — M216X2 Other acquired deformities of left foot: Secondary | ICD-10-CM

## 2022-09-02 DIAGNOSIS — Z9889 Other specified postprocedural states: Secondary | ICD-10-CM

## 2022-09-02 NOTE — Progress Notes (Signed)
Subjective:  Patient ID: Christian [name], male    DOB: 07/13/1940,  MRN: [id number]  Chief Complaint  Patient presents with   Routine Post Op    POV # 2 DOS 08/10/22 --- LEFT 5TH METATARSAL FLOATING OSTEOTOMY    DOS: 08/10/2022 Procedure: Left fifth metatarsal osteotomy  82 y.o. male returns for post-op check.  Patient states that she is doing well.  Denies any other acute complaints bandages clean dry and intact.  Review of Systems: Negative except as noted in the HPI. Denies N/V/F/Ch.  Past Medical History:  Diagnosis Date   Arthritis    Blood transfusion without reported diagnosis    ? in 2017 after crushing injury    Cataract    right eye removed    Crushing injury of pelvic region    Diabetes mellitus without complication (HCC)    Hyperlipidemia    Hypertension     Current Outpatient Medications:    amLODipine (NORVASC) 5 MG tablet, TAKE ONE TABLET BY MOUTH ONCE DAILY, Disp: 180 tablet, Rfl: 2   atorvastatin (LIPITOR) 80 MG tablet, TAKE ONE TABLET BY MOUTH EVERY EVENING, Disp: 90 tablet, Rfl: 2   COMFORT EZ PEN NEEDLES 31G X 8 MM MISC, USE TO INJECT LEVEMIR DAILY, Disp: 100 each, Rfl: 3   Continuous Blood Gluc Receiver (FREESTYLE LIBRE 2 READER) DEVI, Use with sensors to monitor sugar continuously, Disp: 1 each, Rfl: 0   Continuous Glucose Sensor (FREESTYLE LIBRE 2 SENSOR) MISC, USE TO check blood glucose AS DIRECTED AND CHANGE sensor every 14 DAYS, Disp: 2 each, Rfl: 5   Dulaglutide (TRULICITY) 3 MG/0.5ML SOPN, INJECT 3 MG (0.5 ML) UNDER THE SKIN ONCE A WEEK AS DIRECTED, Disp: 2 mL, Rfl: 2   empagliflozin (JARDIANCE) 10 MG TABS tablet, Take 1 tablet (10 mg total) by mouth daily before breakfast., Disp: 90 tablet, Rfl: 3   glipiZIDE (GLUCOTROL) 5 MG tablet, TAKE TWO TABLETS BY MOUTH EVERY MORNING and TAKE ONE TABLET BY MOUTH EVERY EVENING, Disp: 270 tablet, Rfl: 2   glucose blood (ONETOUCH ULTRA) test strip, USE TO check blood glucose daily AS DIRECTED, Disp: 100 strip, Rfl:  3   hydrochlorothiazide (HYDRODIURIL) 12.5 MG tablet, TAKE ONE TABLET BY MOUTH ONCE DAILY, Disp: 90 tablet, Rfl: 2   ibuprofen (ADVIL) 800 MG tablet, Take 1 tablet (800 mg total) by mouth every 6 (six) hours as needed., Disp: 60 tablet, Rfl: 1   insulin degludec (TRESIBA FLEXTOUCH) 200 UNIT/ML FlexTouch Pen, Inject 40 Units into the skin daily., Disp: 15 mL, Rfl: 5   lisinopril (ZESTRIL) 30 MG tablet, TAKE ONE TABLET BY MOUTH EVERY EVENING, Disp: 30 tablet, Rfl: 2   oxyCODONE-acetaminophen (PERCOCET) 5-325 MG tablet, Take 1 tablet by mouth every 4 (four) hours as needed for severe pain., Disp: 30 tablet, Rfl: 0  Social History   Tobacco Use  Smoking Status Never  Smokeless Tobacco Never    No Known Allergies Objective:  There were no vitals filed for this visit. There is no height or weight on file to calculate BMI. Constitutional Well developed. Well nourished.  Vascular Foot warm and well perfused. Capillary refill normal to all digits.   Neurologic Normal speech. Oriented to person, place, and time. Epicritic sensation to light touch grossly present bilaterally.  Dermatologic Skin completely reepithelialized.  No signs of dehiscence or complication noted.  Reduction of submetatarsal 5 pressure noted.  Orthopedic: No tenderness to palpation noted about the surgical site.   Radiographs: 3 views of skeletally mature adult  left foot: Osteotomy noted.  Good correction alignment noted reduction of deformity noted. Assessment:   No diagnosis found.  Plan:  Patient was evaluated and treated and all questions answered.  S/p foot surgery left -Clinically healed and officially discharged from my care.  Significant reduction of submetatarsal 5 pressure noted.  If any foot and ankle issues arises he will come back and see me. No follow-ups on file.

## 2022-09-02 NOTE — Progress Notes (Signed)
Care Management & Coordination Services Pharmacy Team  Reason for Encounter: Medication Coordination and Delivery  Contacted patient to discuss medications and coordinate delivery from [facility].  Spoke with patient on 09/02/2022   Cycle dispensing form sent to [facility] for review.   Last adherence delivery date: 08/13/2022      Patient is due for next adherence delivery on: 09/14/2022  This delivery to include: Adherence Packaging   30 days  Packs: Prevagen chew 30 take 1 tablet breakfast Glipizide 5 mg - 2 tablets breakfast,1 tablet evening meal  Atorvastatin 80mg  take 1 tablet evening meal Amlodipine 5 mg take 1 tablet breakfast Hydrochlorothiazide 12.5mg  take 1 tablet breakfast Lisinopril 30 mg take 1 tablet evening meal  Vials: Freestyle Sensors  Patient declined the following medications this month: None declined  No refill request needed.  Confirmed delivery date of 09/14/2022, advised patient that pharmacy will contact them the morning of delivery.   Any concerns about your medications? No  How often do you forget or accidentally miss a dose? Rarely  Do you use a pillbox? No  Is patient in packaging Yes  If yes  What is the date on your next pill pack? 09/02/2022   Any concerns or issues with your packaging? No  Recent blood pressure readings are as follows: Patient did not have any as he was cutting the grass.   Recent blood glucose readings are as follows: Patient did not have any as he was cutting the grass.   Chart review: Recent office visits:  None since last contact  Recent consult visits:  None since last contact  Hospital visits:  None in previous 6 months  Medications: Outpatient Encounter Medications as of 09/02/2022  Medication Sig Note   amLODipine (NORVASC) 5 MG tablet TAKE ONE TABLET BY MOUTH ONCE DAILY    atorvastatin (LIPITOR) 80 MG tablet TAKE ONE TABLET BY MOUTH EVERY EVENING    COMFORT EZ PEN NEEDLES 31G X  8 MM MISC USE TO INJECT LEVEMIR DAILY    Continuous Blood Gluc Receiver (FREESTYLE LIBRE 2 READER) DEVI Use with sensors to monitor sugar continuously    Continuous Glucose Sensor (FREESTYLE LIBRE 2 SENSOR) MISC USE TO check blood glucose AS DIRECTED AND CHANGE sensor every 14 DAYS    Dulaglutide (TRULICITY) 3 MG/0.5ML SOPN INJECT 3 MG (0.5 ML) UNDER THE SKIN ONCE A WEEK AS DIRECTED 05/20/2022: Lilly Cares PAP   empagliflozin (JARDIANCE) 10 MG TABS tablet Take 1 tablet (10 mg total) by mouth daily before breakfast. 02/19/2022: [name] PAP   glipiZIDE (GLUCOTROL) 5 MG tablet TAKE TWO TABLETS BY MOUTH EVERY MORNING and TAKE ONE TABLET BY MOUTH EVERY EVENING    glucose blood (ONETOUCH ULTRA) test strip USE TO check blood glucose daily AS DIRECTED    hydrochlorothiazide (HYDRODIURIL) 12.5 MG tablet TAKE ONE TABLET BY MOUTH ONCE DAILY    ibuprofen (ADVIL) 800 MG tablet Take 1 tablet (800 mg total) by mouth every 6 (six) hours as needed.    insulin degludec (TRESIBA FLEXTOUCH) 200 UNIT/ML FlexTouch Pen Inject 40 Units into the skin daily.    lisinopril (ZESTRIL) 30 MG tablet TAKE ONE TABLET BY MOUTH EVERY EVENING    oxyCODONE-acetaminophen (PERCOCET) 5-325 MG tablet Take 1 tablet by mouth every 4 (four) hours as needed for severe pain.    No facility-administered encounter medications on file as of 09/02/2022.   BP Readings from Last 3 Encounters:  07/27/22 122/66  06/01/22 115/67  04/27/22 118/62  Pulse Readings from Last 3 Encounters:  07/27/22 79  06/01/22 74  04/27/22 73    Lab Results  Component Value Date/Time   HGBA1C 8.3 (H) 07/20/2022 08:11 AM   HGBA1C 8.2 (H) 04/27/2022 09:30 AM   Lab Results  Component Value Date   CREATININE 1.53 (H) 07/20/2022   BUN 21 07/20/2022   GFR 42.52 (L) 07/20/2022   GFRNONAA >60 08/22/2020   GFRAA >60 05/17/2016   NA 141 07/20/2022   K 4.2 07/20/2022   CALCIUM 8.9 07/20/2022   CO2 25 07/20/2022   Al Corpus, CPP notified   Claudina Lick,  [place] Clinical Pharmacy Assistant [phone]

## 2022-09-04 ENCOUNTER — Other Ambulatory Visit: Payer: Self-pay | Admitting: Orthopedic Surgery

## 2022-09-04 DIAGNOSIS — M25551 Pain in right hip: Secondary | ICD-10-CM

## 2022-09-10 ENCOUNTER — Ambulatory Visit
Admission: RE | Admit: 2022-09-10 | Discharge: 2022-09-10 | Disposition: A | Discharge status: Home / Self Care | Payer: Medicare Other | Source: Ambulatory Visit | Attending: Orthopedic Surgery | Admitting: Orthopedic Surgery

## 2022-09-10 DIAGNOSIS — M25551 Pain in right hip: Secondary | ICD-10-CM

## 2022-09-10 DIAGNOSIS — M4727 Other spondylosis with radiculopathy, lumbosacral region: Secondary | ICD-10-CM | POA: Diagnosis not present

## 2022-09-10 MED ORDER — IOPAMIDOL (ISOVUE-M 200) INJECTION 41%
1.0000 mL | Freq: Once | INTRAMUSCULAR | Status: AC
  Administered 2022-09-10: 1 mL via EPIDURAL

## 2022-09-10 MED ORDER — METHYLPREDNISOLONE ACETATE 40 MG/ML INJ SUSP (RADIOLOG
80.0000 mg | Freq: Once | INTRAMUSCULAR | Status: AC
  Administered 2022-09-10: 80 mg via EPIDURAL

## 2022-09-10 NOTE — Discharge Instructions (Signed)

## 2022-09-13 ENCOUNTER — Other Ambulatory Visit: Payer: Self-pay | Admitting: Family Medicine

## 2022-10-06 ENCOUNTER — Other Ambulatory Visit: Payer: Self-pay | Admitting: Family Medicine

## 2022-10-06 ENCOUNTER — Other Ambulatory Visit: Payer: Self-pay | Admitting: Pharmacist

## 2022-10-06 MED ORDER — TRULICITY 3 MG/0.5ML ~~LOC~~ SOAJ: 3.0000 mg | SUBCUTANEOUS | 2 refills | Status: DC

## 2022-10-06 MED ORDER — TRESIBA FLEXTOUCH 200 UNIT/ML ~~LOC~~ SOPN: 40.0000 [IU] | PEN_INJECTOR | Freq: Every day | SUBCUTANEOUS | 5 refills | Status: DC

## 2022-10-06 NOTE — Progress Notes (Signed)
Patient was in office today with his wife and asked Dr. Para March about his trulicity refill. He stated that he talked to with Amy (lindsey's assistant) about 2 weeks ago but I did not see anything in the chart about this.

## 2022-10-06 NOTE — Progress Notes (Signed)
Care Coordination Call  Received question regarding Trulicity refill from [facility] patient assistance. Refill placed to [facility] under Dr. Para March.   Will notify patient to call Lilly Cares to follow up on shipping and delivery.   Catie Eppie Gibson, PharmD, BCACP, CPP Clinical Pharmacist [facility] [phone]

## 2022-10-06 NOTE — Progress Notes (Unsigned)
Patient needed refill on tresiba, rx sent.  Please check on PAP/refill for trulicity.  Thanks.

## 2022-10-19 ENCOUNTER — Other Ambulatory Visit (INDEPENDENT_AMBULATORY_CARE_PROVIDER_SITE_OTHER): Payer: Medicare Other

## 2022-10-19 DIAGNOSIS — E1142 Type 2 diabetes mellitus with diabetic polyneuropathy: Secondary | ICD-10-CM | POA: Diagnosis not present

## 2022-10-19 LAB — HEMOGLOBIN A1C: Hgb A1c MFr Bld: 8.3 % — ABNORMAL HIGH (ref 4.6–6.5)

## 2022-10-19 LAB — TSH: TSH: 4.38 u[IU]/mL (ref 0.35–5.50)

## 2022-10-26 ENCOUNTER — Encounter: Payer: Self-pay | Admitting: Family Medicine

## 2022-10-26 ENCOUNTER — Ambulatory Visit (INDEPENDENT_AMBULATORY_CARE_PROVIDER_SITE_OTHER): Payer: Medicare Other | Admitting: Family Medicine

## 2022-10-26 VITALS — BP 120/58 | HR 80 | Temp 97.9°F | Ht 73.0 in | Wt 214.0 lb

## 2022-10-26 DIAGNOSIS — E1142 Type 2 diabetes mellitus with diabetic polyneuropathy: Secondary | ICD-10-CM

## 2022-10-26 DIAGNOSIS — Z7985 Long-term (current) use of injectable non-insulin antidiabetic drugs: Secondary | ICD-10-CM

## 2022-10-26 NOTE — Progress Notes (Unsigned)
Diabetes:  Using medications without difficulties: yes Hypoglycemic episodes: no Hyperglycemic episodes:no Feet problems: see exam Blood Sugars averaging: usually ~ 100-150 but slightly lower recently eye exam within last year: yes A1c 8.3.  goal A1c ~8 to limit risk of hypoglycemia.    He had occ loose stools with fecal urgency.  D/w pt about inc in fiber.  Unclear if this is related to trulicity.  Discussed.    Discussed with patient about his wife- her son had died and that was clearly traumatic for his wife.  And she has memory changes noted.    TSH wnl, d/w pt.  Not on replacement.   Meds, vitals, and allergies reviewed.   ROS: Per HPI unless specifically indicated in ROS section   GEN: nad, alert and oriented HEENT: ncat NECK: supple w/o LA CV: rrr. PULM: ctab, no inc wob ABD: soft, +bs EXT: no edema SKIN: no acute rash  Diabetic foot exam: Normal inspection except for R 5th toe amputation.   No skin breakdown B 1st MT calluses.  Both shaved down with scalpel after he consented.  Tolerated well w/o complication.   He felt better after treatment.   Normal DP pulses Normal sensation to light touch and monofilament Nails normal

## 2022-10-26 NOTE — Patient Instructions (Addendum)
Recheck in about 3 months.  Labs at the visit.  Take care.  Glad to see you. Let me know if increasing fiber doesn't help.

## 2022-10-28 ENCOUNTER — Other Ambulatory Visit: Payer: Self-pay | Admitting: Family Medicine

## 2022-10-28 NOTE — Assessment & Plan Note (Signed)
Goal A1c around 8 to limit risk of hypoglycemia.  Both calluses on the feet shaved down without complication.  Routine footcare discussed with patient.  Recheck periodically.  Continue Trulicity Jardiance glipizide and insulin as is.  If he continues to have GI symptoms he can let me know.  25 minutes were devoted to patient care in this encounter (this includes time spent reviewing the patient's file/history, interviewing and examining the patient, counseling/reviewing plan with patient).

## 2022-11-05 ENCOUNTER — Other Ambulatory Visit: Payer: Self-pay | Admitting: Family Medicine

## 2022-11-18 ENCOUNTER — Encounter: Payer: Medicare Other | Admitting: Pharmacist

## 2022-12-08 DIAGNOSIS — L309 Dermatitis, unspecified: Secondary | ICD-10-CM | POA: Diagnosis not present

## 2022-12-08 DIAGNOSIS — C44212 Basal cell carcinoma of skin of right ear and external auricular canal: Secondary | ICD-10-CM | POA: Diagnosis not present

## 2022-12-08 DIAGNOSIS — L578 Other skin changes due to chronic exposure to nonionizing radiation: Secondary | ICD-10-CM | POA: Diagnosis not present

## 2022-12-08 DIAGNOSIS — L570 Actinic keratosis: Secondary | ICD-10-CM | POA: Diagnosis not present

## 2022-12-17 ENCOUNTER — Other Ambulatory Visit: Payer: Self-pay | Admitting: Podiatry

## 2022-12-17 ENCOUNTER — Other Ambulatory Visit: Payer: Self-pay | Admitting: Family Medicine

## 2022-12-17 DIAGNOSIS — E1142 Type 2 diabetes mellitus with diabetic polyneuropathy: Secondary | ICD-10-CM

## 2022-12-23 ENCOUNTER — Other Ambulatory Visit: Payer: Self-pay | Admitting: Family Medicine

## 2022-12-23 DIAGNOSIS — E1142 Type 2 diabetes mellitus with diabetic polyneuropathy: Secondary | ICD-10-CM

## 2022-12-29 ENCOUNTER — Telehealth: Payer: Self-pay | Admitting: Family Medicine

## 2022-12-29 ENCOUNTER — Other Ambulatory Visit: Payer: Self-pay | Admitting: Family Medicine

## 2022-12-29 DIAGNOSIS — E1142 Type 2 diabetes mellitus with diabetic polyneuropathy: Secondary | ICD-10-CM

## 2022-12-29 MED ORDER — EMPAGLIFLOZIN 10 MG PO TABS: 10.0000 mg | ORAL_TABLET | Freq: Every day | ORAL | 3 refills | Status: DC

## 2022-12-29 NOTE — Telephone Encounter (Signed)
Prescription Request  12/29/2022  LOV: 10/26/2022  What is the name of the medication or equipment? empagliflozin (JARDIANCE) 10 MG TABS tablet   Have you contacted your pharmacy to request a refill? No   Which pharmacy would you like this sent to?  [facility] [address] [place], [place] - [address] [address] [place] [address] Phone: [phone] Fax: [phone]    Patient notified that their request is being sent to the clinical staff for review and that they should receive a response within 2 business days.   Please advise at [facility] [phone]

## 2022-12-29 NOTE — Telephone Encounter (Signed)
Erx sent

## 2023-01-07 ENCOUNTER — Other Ambulatory Visit: Payer: Self-pay | Admitting: Family Medicine

## 2023-01-07 ENCOUNTER — Other Ambulatory Visit (HOSPITAL_COMMUNITY): Payer: Self-pay

## 2023-01-07 MED ORDER — EMPAGLIFLOZIN 10 MG PO TABS
10.0000 mg | ORAL_TABLET | Freq: Every day | ORAL | 3 refills | Status: DC
  Filled 2023-01-07 – 2023-01-11 (×2): qty 90, 90d supply, fill #0
  Filled 2023-02-08: qty 30, 30d supply, fill #0
  Filled 2023-02-10: qty 90, 90d supply, fill #0
  Filled 2023-03-01 – 2023-03-29 (×2): qty 30, 30d supply, fill #0
  Filled 2023-04-13 – 2023-04-21 (×2): qty 30, 30d supply, fill #1

## 2023-01-07 NOTE — Progress Notes (Signed)
Patient needed jardiance sent to [facility] community pharmacy.  Rx sent.    He had diarrhea, unclear if med related.  I asked him to see if any specific med elimination helped his symptoms and to update me as needed.

## 2023-01-11 ENCOUNTER — Other Ambulatory Visit (HOSPITAL_COMMUNITY): Payer: Self-pay

## 2023-01-25 DIAGNOSIS — C4441 Basal cell carcinoma of skin of scalp and neck: Secondary | ICD-10-CM | POA: Diagnosis not present

## 2023-01-26 ENCOUNTER — Encounter: Payer: Self-pay | Admitting: Family Medicine

## 2023-01-26 ENCOUNTER — Ambulatory Visit (INDEPENDENT_AMBULATORY_CARE_PROVIDER_SITE_OTHER): Payer: Medicare Other | Admitting: Family Medicine

## 2023-01-26 VITALS — BP 120/72 | HR 79 | Temp 97.6°F | Ht 73.0 in | Wt 213.4 lb

## 2023-01-26 DIAGNOSIS — E1129 Type 2 diabetes mellitus with other diabetic kidney complication: Secondary | ICD-10-CM | POA: Diagnosis not present

## 2023-01-26 DIAGNOSIS — R809 Proteinuria, unspecified: Secondary | ICD-10-CM | POA: Diagnosis not present

## 2023-01-26 DIAGNOSIS — Z23 Encounter for immunization: Secondary | ICD-10-CM | POA: Diagnosis not present

## 2023-01-26 DIAGNOSIS — Z7984 Long term (current) use of oral hypoglycemic drugs: Secondary | ICD-10-CM

## 2023-01-26 DIAGNOSIS — E1142 Type 2 diabetes mellitus with diabetic polyneuropathy: Secondary | ICD-10-CM | POA: Diagnosis not present

## 2023-01-26 DIAGNOSIS — C4490 Unspecified malignant neoplasm of skin, unspecified: Secondary | ICD-10-CM | POA: Diagnosis not present

## 2023-01-26 LAB — POCT GLYCOSYLATED HEMOGLOBIN (HGB A1C): Hemoglobin A1C: 7.6 % — AB (ref 4.0–5.6)

## 2023-01-26 MED ORDER — TRULICITY 3 MG/0.5ML ~~LOC~~ SOAJ: 3.0000 mg | SUBCUTANEOUS | 5 refills | Status: DC

## 2023-01-26 NOTE — Patient Instructions (Addendum)
Let me check on options re: jardiance.  Keep the calluses clean and covered.   Refer back to podiatry.  Flu shot today.   Plan on recheck in about 3 months.  Labs at the visit.

## 2023-01-26 NOTE — Progress Notes (Unsigned)
Diabetes:  Using medications without difficulties: see below re: jardiance.  Hypoglycemic episodes: no   Hyperglycemic episodes no Feet problems: h/o calluses noted.  Sensation intact.   Blood Sugars averaging: 100-140 A1c 7.6, d/w pt at OV.    Off Jardiance due to cost.  I am asking for pharmacy help in the meantime.    Skin cancer removed yesterday per dermatology. Posterior to R ear.  Clean and dressed in the meantime.    Meds, vitals, and allergies reviewed.   ROS: Per HPI unless specifically indicated in ROS section   GEN: nad, alert and oriented HEENT: ncat, healing sutured incision site posterior to right pinna.  No erythema or discharge.  Area redressed. NECK: supple w/o LA CV: rrr. PULM: ctab, no inc wob ABD: soft, +bs EXT: no edema SKIN: no acute rash  Diabetic foot exam: Normal inspection except for R 5h amputation.   No skin breakdown B 1st ray plantar calluses.    Normal DP pulses Normal sensation to light touch and monofilament Nails thickened.   Patient consented for shaving calluses down at office visit. Left foot first ray with 2 cm callus, cleaned and shaved with scalpel.  He had a 0.5 cm shallow ulcerated area deep to the callus.  It does not probe deeply.  No spreading erythema or discharge.  He has a 1 cm callus on the right first ray, cleaned and shaved down with callus.  No ulceration.  Tolerated well.  41 minutes were devoted to patient care in this encounter (this includes time spent reviewing the patient's file/history, interviewing and examining the patient, counseling/reviewing plan with patient).

## 2023-01-27 ENCOUNTER — Other Ambulatory Visit: Payer: Self-pay | Admitting: Family Medicine

## 2023-01-27 DIAGNOSIS — E1142 Type 2 diabetes mellitus with diabetic polyneuropathy: Secondary | ICD-10-CM

## 2023-01-27 DIAGNOSIS — C4490 Unspecified malignant neoplasm of skin, unspecified: Secondary | ICD-10-CM | POA: Insufficient documentation

## 2023-01-27 NOTE — Assessment & Plan Note (Signed)
With foot ulcer noted.  Refer back to podiatry. Both calluses addressed at office visit today. Plan on recheck in about 3 months.  Labs at the visit.   I am checking all help with Jardiance availability.  Continue Trulicity and insulin for now.

## 2023-01-27 NOTE — Assessment & Plan Note (Signed)
Posterior to right pinna, status post excision, awaiting dermatology notes.  He can keep the area clean and dressed in the meantime.

## 2023-01-28 ENCOUNTER — Telehealth: Payer: Self-pay

## 2023-01-28 NOTE — Progress Notes (Signed)
Care Guide Note  01/28/2023 Name: Christian [name] MRN: [id number] DOB: 18-Dec-1940  Referred by: Joaquim Nam, MD Reason for referral : Care Coordination (Outreach to schedule with Pharm d )   Christian [name] is a 82 y.o. year old male who is a primary care patient of Joaquim Nam, MD. Christian [name] was referred to the pharmacist for assistance related to DM.    Successful contact was made with the patient to discuss pharmacy services including being ready for the pharmacist to call at least 5 minutes before the scheduled appointment time, to have medication bottles and any blood sugar or blood pressure readings ready for review. The patient agreed to meet with the pharmacist via with the pharmacist via telephone visit on (date/time).  02/04/2023  Penne Lash, RMA Care Guide [facility]  [place], [place] [address] Direct Dial: [phone] [email or web address] [email or web address]

## 2023-02-04 ENCOUNTER — Other Ambulatory Visit: Payer: Medicare Other | Admitting: Pharmacist

## 2023-02-04 DIAGNOSIS — E1142 Type 2 diabetes mellitus with diabetic polyneuropathy: Secondary | ICD-10-CM

## 2023-02-04 DIAGNOSIS — I10 Essential (primary) hypertension: Secondary | ICD-10-CM

## 2023-02-04 DIAGNOSIS — E1129 Type 2 diabetes mellitus with other diabetic kidney complication: Secondary | ICD-10-CM

## 2023-02-04 DIAGNOSIS — E785 Hyperlipidemia, unspecified: Secondary | ICD-10-CM

## 2023-02-04 MED ORDER — GLIPIZIDE 5 MG PO TABS
ORAL_TABLET | ORAL | 2 refills | Status: DC
  Filled 2023-02-04: qty 90, 30d supply, fill #0
  Filled 2023-02-10: qty 270, 90d supply, fill #0
  Filled 2023-03-01: qty 90, 30d supply, fill #0
  Filled 2023-03-29: qty 270, 90d supply, fill #0
  Filled 2023-05-14 – 2023-05-27 (×2): qty 270, 90d supply, fill #1
  Filled 2023-06-10: qty 90, 30d supply, fill #1
  Filled 2023-06-17 – 2023-07-05 (×2): qty 90, 30d supply, fill #2
  Filled 2023-12-14: qty 90, 30d supply, fill #3
  Filled 2024-01-17: qty 90, 30d supply, fill #4

## 2023-02-04 MED ORDER — ATORVASTATIN CALCIUM 80 MG PO TABS
80.0000 mg | ORAL_TABLET | Freq: Every evening | ORAL | 2 refills | Status: DC
  Filled 2023-02-04: qty 30, 30d supply, fill #0
  Filled 2023-02-10: qty 90, 90d supply, fill #0
  Filled 2023-03-01 – 2023-03-29 (×2): qty 30, 30d supply, fill #0
  Filled 2023-04-13 – 2023-04-21 (×2): qty 30, 30d supply, fill #1
  Filled 2023-05-14 – 2023-05-18 (×2): qty 30, 30d supply, fill #2
  Filled 2023-05-27 – 2023-06-10 (×2): qty 30, 30d supply, fill #3
  Filled 2023-06-17 – 2023-07-05 (×2): qty 30, 30d supply, fill #4

## 2023-02-04 MED ORDER — FREESTYLE LIBRE 2 SENSOR MISC
10 refills | Status: DC
  Filled 2023-02-04 – 2023-03-24 (×5): qty 2, 28d supply, fill #0
  Filled 2023-05-12: qty 2, 28d supply, fill #1
  Filled 2023-12-20: qty 2, 28d supply, fill #2

## 2023-02-04 MED ORDER — ONETOUCH ULTRA VI STRP
ORAL_STRIP | 3 refills | Status: AC
  Filled 2023-02-04 – 2023-03-23 (×2): qty 100, 100d supply, fill #0
  Filled 2023-03-23: qty 100, 90d supply, fill #0
  Filled 2023-03-24: qty 100, 100d supply, fill #0
  Filled 2023-05-12 – 2023-06-07 (×2): qty 100, 100d supply, fill #1
  Filled 2023-09-17: qty 100, 100d supply, fill #2
  Filled 2023-12-20: qty 100, 100d supply, fill #3

## 2023-02-04 MED ORDER — AMLODIPINE BESYLATE 5 MG PO TABS
5.0000 mg | ORAL_TABLET | Freq: Every day | ORAL | 2 refills | Status: DC
  Filled 2023-02-04 – 2023-02-10 (×2): qty 180, 180d supply, fill #0
  Filled 2023-03-01 – 2023-03-29 (×2): qty 30, 30d supply, fill #0
  Filled 2023-04-13 – 2023-04-21 (×2): qty 30, 30d supply, fill #1
  Filled 2023-05-14 – 2023-05-18 (×2): qty 30, 30d supply, fill #2
  Filled 2023-05-27 – 2023-06-10 (×2): qty 30, 30d supply, fill #3
  Filled 2023-06-17 – 2023-07-05 (×2): qty 30, 30d supply, fill #4
  Filled 2023-09-17: qty 30, 30d supply, fill #5
  Filled 2023-11-02: qty 30, 30d supply, fill #6
  Filled 2023-12-03: qty 30, 30d supply, fill #7
  Filled 2024-01-05: qty 30, 30d supply, fill #8

## 2023-02-04 MED ORDER — HYDROCHLOROTHIAZIDE 12.5 MG PO TABS
12.5000 mg | ORAL_TABLET | Freq: Every day | ORAL | 2 refills | Status: DC
  Filled 2023-02-04 – 2023-02-10 (×2): qty 90, 90d supply, fill #0
  Filled 2023-03-01 – 2023-03-29 (×2): qty 30, 30d supply, fill #0
  Filled 2023-04-13 – 2023-04-21 (×2): qty 30, 30d supply, fill #1
  Filled 2023-05-14 – 2023-05-18 (×2): qty 30, 30d supply, fill #2
  Filled 2023-05-27 – 2023-06-10 (×2): qty 30, 30d supply, fill #3
  Filled 2023-06-17 – 2023-07-05 (×2): qty 30, 30d supply, fill #4
  Filled 2023-09-17: qty 30, 30d supply, fill #5
  Filled 2023-11-02: qty 30, 30d supply, fill #6
  Filled 2023-12-03: qty 30, 30d supply, fill #7
  Filled 2024-01-05: qty 30, 30d supply, fill #8

## 2023-02-04 MED ORDER — LISINOPRIL 30 MG PO TABS
30.0000 mg | ORAL_TABLET | Freq: Every evening | ORAL | 3 refills | Status: DC
  Filled 2023-02-04 – 2023-02-10 (×2): qty 90, 90d supply, fill #0
  Filled 2023-03-01 – 2023-03-29 (×2): qty 30, 30d supply, fill #0
  Filled 2023-04-13 – 2023-04-21 (×2): qty 30, 30d supply, fill #1
  Filled 2023-05-14 – 2023-05-18 (×2): qty 30, 30d supply, fill #2
  Filled 2023-05-27 – 2023-06-10 (×2): qty 30, 30d supply, fill #3
  Filled 2023-06-17 – 2023-07-05 (×2): qty 30, 30d supply, fill #4
  Filled 2023-09-17: qty 30, 30d supply, fill #5
  Filled 2023-11-02: qty 30, 30d supply, fill #6
  Filled 2023-12-03: qty 30, 30d supply, fill #7
  Filled 2024-01-05: qty 30, 30d supply, fill #8

## 2023-02-04 MED ORDER — COMFORT EZ PEN NEEDLES 31G X 8 MM MISC
1.0000 | Freq: Every day | 3 refills | Status: DC
  Filled 2023-02-04: qty 100, fill #0
  Filled 2023-02-10: qty 100, 100d supply, fill #0

## 2023-02-04 MED ORDER — TRESIBA FLEXTOUCH 200 UNIT/ML ~~LOC~~ SOPN
40.0000 [IU] | PEN_INJECTOR | Freq: Every day | SUBCUTANEOUS | 5 refills | Status: DC
  Filled 2023-02-04 – 2023-02-10 (×2): qty 15, 75d supply, fill #0

## 2023-02-04 NOTE — Addendum Note (Signed)
Addended by: Loree Fee on: 02/04/2023 04:51 PM   Modules accepted: Orders

## 2023-02-04 NOTE — Progress Notes (Addendum)
02/04/2023 Name: Christian [name] MRN: [id number] DOB: [birth year]  Subjective  Chief Complaint  Patient presents with   Medication Access    Reason for visit: Christian [name] is a 82 y.o. year old male who presented for a telephone visit.   They were referred to the pharmacist by their PCP for assistance in managing medication access.   Care Team: Primary Care Provider: Joaquim Nam, MD  Reason for visit: ?  Christian [name] is a 82 y.o. male who presents today for a phone visit with the pharmacist due to medication access concerns regarding their Jardiance. ?   Medication Access: ?  Prescription drug coverage: Payor: Advertising copywriter MEDICARE / Plan: [facility] MEDICARE / Product Type: *No Product type* / .   Reports that all medications are not affordable.  Patient is currently enrolled in the [facility] and BICares patient assistance program(s) and would like assistance with re-enrollment for the next calendar year.  Regarding BICares program for [place], reports that he lost the phone number and was unable to order refills. Would like help getting back on track with MAP programs.   Current Patient Assistance: [employer] ([name]) Secretary/administrator  Previously using Pharmacist, hospital. Now using a Journalist, newspaper out of [place] ([employer]). Today, he asks about mail order options through [facility].  Patient lives in a household of 2 and is unsure of combined monthly income at this time. Income includes  via Product manager.  Medicare LIS Eligible: No  Couples Asset Limit: $34,360   Assessment and Plan:   1. Medication Access Patient will look for previous Jardiance bottles/paperwork to obtain his Rx # to request refills online. Also provided him with BICares phone number to request refill if needed (warned him of long hold times) Patient lives in a household of 2  and is currently enrolled in MAP  and therefore is eligible for MAP renewal.   [employer] ([name]) [employer] application filled out today, provider page placed in PCP inbox.  Patient forms: mailed to patient's home address per patient request. Patient to return forms to clinic (in person or by mail or fax) Income documentation: Patient will send income documents via mail Patient is not eligible for copay cards due to government insurance. Upon completion of signed forms, will fax full application to [employer] ([name]) [employer] and upload in [employer] Tab. LillyCares Fax: [phone] and BICares Fax: [phone].  Addendum:  Patient is interested in filling all chronic medications through [facility]. Many prescriptions still listed as [facility] which is no longer in service. Prefers mail order, Rx pended to [facility].  Future Appointments  Date Time Provider Department Center  02/12/2023  8:15 AM Candelaria Stagers, DPM [facility]  04/29/2023  9:00 AM Joaquim Nam, MD [facility]  07/05/2023  8:45 AM [facility] VISIT 1 [facility]    Loree Fee, PharmD Clinical Pharmacist [facility] [phone]

## 2023-02-04 NOTE — Patient Instructions (Addendum)
Mr. ADRIANO KABA,   It was a pleasure to speak with you today! As we discussed:?   We will work on Lawyer for the [facility] and [employer] medication assistance program for the 2025 calendar year.  I will mail you the application  Please provide a copy of your proof of income: Examples of proof of income: 2 Bank deposits showing social security income, Social Security Benefit Statement (Form SSA-1099 or SSA-1042S - this is mailed to you each year showing what your benefit amount will be. It can also be requested on the social security website.)  You are actively enrolled in the [facility] Cares program for Jardiance through 04/13/23 Please request a medication refill from [facility] as below:    Please reach out prior to your next scheduled appointment should you have any questions or concerns.  Thank you!   Future Appointments  Date Time Provider Department Center  02/12/2023  8:15 AM Candelaria Stagers, DPM [facility]  04/29/2023  9:00 AM Joaquim Nam, MD [facility]  07/05/2023  8:45 AM [facility] [place] VISIT 1 [facility]    Loree Fee, PharmD Clinical Pharmacist [facility] [phone]

## 2023-02-05 ENCOUNTER — Other Ambulatory Visit: Payer: Self-pay

## 2023-02-05 ENCOUNTER — Other Ambulatory Visit (HOSPITAL_COMMUNITY): Payer: Self-pay

## 2023-02-06 ENCOUNTER — Other Ambulatory Visit: Payer: Self-pay | Admitting: Family Medicine

## 2023-02-06 DIAGNOSIS — I10 Essential (primary) hypertension: Secondary | ICD-10-CM

## 2023-02-08 ENCOUNTER — Other Ambulatory Visit: Payer: Self-pay | Admitting: Pharmacist

## 2023-02-08 ENCOUNTER — Other Ambulatory Visit (HOSPITAL_COMMUNITY): Payer: Self-pay

## 2023-02-08 NOTE — Progress Notes (Unsigned)
Patient left voicemail that he found his letter from [facility] for re-enrollment for 2025. He requested a call back to review the letter. We had previously discussed looking for 2024 approval letter for [facility].   Patient will complete application and ***.  Reminded him to call [name] for his Jardiance refill (approved through the end of the year).   Loree Fee, PharmD Clinical Pharmacist [facility] [phone]

## 2023-02-09 ENCOUNTER — Other Ambulatory Visit: Payer: Self-pay

## 2023-02-10 ENCOUNTER — Other Ambulatory Visit (HOSPITAL_COMMUNITY): Payer: Self-pay

## 2023-02-10 ENCOUNTER — Other Ambulatory Visit: Payer: Self-pay

## 2023-02-12 ENCOUNTER — Ambulatory Visit: Payer: Medicare Other | Admitting: Podiatry

## 2023-02-12 ENCOUNTER — Other Ambulatory Visit (HOSPITAL_COMMUNITY): Payer: Self-pay

## 2023-02-12 ENCOUNTER — Encounter: Payer: Self-pay | Admitting: Podiatry

## 2023-02-12 DIAGNOSIS — L97521 Non-pressure chronic ulcer of other part of left foot limited to breakdown of skin: Secondary | ICD-10-CM | POA: Diagnosis not present

## 2023-02-12 MED ORDER — METHYLPREDNISOLONE 4 MG PO TBPK
ORAL_TABLET | ORAL | 0 refills | Status: AC
Start: 2023-02-12 — End: 2023-02-17
  Filled 2023-02-12: qty 21, 6d supply, fill #0

## 2023-02-12 NOTE — Progress Notes (Signed)
Subjective:  Patient ID: Christian [name], male    DOB: 1940/09/23,  MRN: [id number]  Chief Complaint  Patient presents with   [facility]    [facility] patient states he is having issues with possible ulcer    82 y.o. male presents with the above complaint.  Patient presents with complaint left submetatarsal 1 ulceration superficial.  He wanted to get it evaluated he has not seen anyone as prior to seeing me.  His surgical site is doing good.  He denies any other acute issues.  Pain scale is 5 out of 10 dull aching nature   Review of Systems: Negative except as noted in the HPI. Denies N/V/F/Ch.  Past Medical History:  Diagnosis Date   Arthritis    Blood transfusion without reported diagnosis    ? in 2017 after crushing injury    Cataract    right eye removed    Crushing injury of pelvic region    Diabetes mellitus without complication (HCC)    Hyperlipidemia    Hypertension     Current Outpatient Medications:    amLODipine (NORVASC) 5 MG tablet, Take 1 tablet (5 mg total) by mouth daily., Disp: 180 tablet, Rfl: 2   atorvastatin (LIPITOR) 80 MG tablet, Take 1 tablet (80 mg total) by mouth every evening., Disp: 90 tablet, Rfl: 2   Continuous Blood Gluc Receiver (FREESTYLE LIBRE 2 READER) DEVI, Use with sensors to monitor sugar continuously, Disp: 1 each, Rfl: 0   Continuous Glucose Sensor (FREESTYLE LIBRE 2 SENSOR) MISC, USE TO MONITOR BLOOD SUGAR. CHANGE SENSOR EVERY 14 DAYS., Disp: 2 each, Rfl: 10   Dulaglutide (TRULICITY) 3 MG/0.5ML SOPN, Inject 3 mg into the skin once a week., Disp: 8 mL, Rfl: 5   empagliflozin (JARDIANCE) 10 MG TABS tablet, Take 1 tablet (10 mg total) by mouth daily before breakfast., Disp: 90 tablet, Rfl: 3   glipiZIDE (GLUCOTROL) 5 MG tablet, Take 2 tablets (10 mg total) by mouth in the morning AND 1 tablet (5 mg total) every evening., Disp: 270 tablet, Rfl: 2   glucose blood (ONETOUCH ULTRA) test strip, USE TO check blood glucose daily AS DIRECTED, Disp: 100 strip, Rfl: 3    hydrochlorothiazide (HYDRODIURIL) 12.5 MG tablet, Take 1 tablet (12.5 mg total) by mouth daily., Disp: 90 tablet, Rfl: 2   ibuprofen (ADVIL) 800 MG tablet, TAKE 1 TABLET BY MOUTH EVERY 6 HOURS AS NEEDED, Disp: 60 tablet, Rfl: 10   insulin degludec (TRESIBA FLEXTOUCH) 200 UNIT/ML FlexTouch Pen, Inject 40 Units into the skin daily., Disp: 15 mL, Rfl: 5   Insulin Pen Needle (COMFORT EZ PEN NEEDLES) 31G X 8 MM MISC, Inject 1 each into the skin daily. Use once daily to deliver insulin injection, Disp: 100 each, Rfl: 3   lisinopril (ZESTRIL) 30 MG tablet, Take 1 tablet (30 mg total) by mouth every evening., Disp: 90 tablet, Rfl: 3  Social History   Tobacco Use  Smoking Status Never  Smokeless Tobacco Never    No Known Allergies Objective:  There were no vitals filed for this visit. There is no height or weight on file to calculate BMI. Constitutional Well developed. Well nourished.  Vascular Dorsalis pedis pulses palpable bilaterally. Posterior tibial pulses palpable bilaterally. Capillary refill normal to all digits.  No cyanosis or clubbing noted. Pedal hair growth normal.  Neurologic Normal speech. Oriented to person, place, and time. Epicritic sensation to light touch grossly present bilaterally.  Dermatologic Nails*left submetatarsal 1 ulceration limited to the breakdown of the skin  No purulent drainage noted no malodor present no redness noted.  Does not probe down to deep tissue  Orthopedic: Normal joint ROM without pain or crepitus bilaterally. No visible deformities. No bony tenderness.   Radiographs: None Assessment:   1. Foot ulcer, limited to breakdown of skin, left [facility])    Plan:  Patient was evaluated and treated and all questions answered.  Left submetatarsal 1 ulceration limited to the breakdown of the skin -All questions and concerns were discussed with the patient extensive detail -Discussed with him eventually patient may need sesamoidectomy given the location  of the submetatarsal 1 ulceration I discussed with patient he will think about it -For now we will continue Triple Antibiotic and a Band-Aid I discussed shoe gear modification  No follow-ups on file.

## 2023-02-16 ENCOUNTER — Other Ambulatory Visit: Payer: Self-pay | Admitting: Family Medicine

## 2023-02-16 DIAGNOSIS — I10 Essential (primary) hypertension: Secondary | ICD-10-CM

## 2023-02-18 NOTE — Progress Notes (Signed)
   02/24/2023 Name: Christian [name] MRN: [id number] DOB: 1940/09/23  Subjective  Chief Complaint  Patient presents with   Medication Assistance   Medication Access    Reason for visit: Christian [name] is a 82 y.o. year old male who presented to review completed MAP applications and to discuss changing his pharmacy.    They were referred to the pharmacist by their PCP for assistance in managing medication access, patient requested this follow up visit to review his applications together.   Care Team: Primary Care Provider: Joaquim Nam, MD?   Medication Access: ?  Prescription drug coverage: Payor: [place] HE[employer]ARE MEDICARE / Plan: [facility] MEDICARE / Product Type: *No Product type* / . Brought in paperwork today outlining his insurance changes next year.   Patient is currently enrolled in the [facility] and BICares patient assistance program(s) and would like assistance with re-enrollment for the next calendar year.   Previously had stopped receiving shipment of Jardiance and was unsure why. Resolved at previous visit. Patient confirms receipt of Jardiance from [facility] last week.  Last visit, refills of chronic medications were sent to [facility] per patient request. Reports he is still receiving communications from his previous pharmacy, though has not contacted them to stop refills. Would like assistance with setting up mail order.   Reported combined income: Household of 2 with spouse: [id number]  Medicare LIS Eligible: No  Couples Asset Limit: $34,360   Assessment and Plan:   1. Medication Access MAP renewals reviewed with patient in clinic.  [name] application is complete at this time, provider page placed in PCP inbox.  Lilly Re-enrollment completed online (Media Tab) Upon completion of signed forms, fax full application and upload in Media Tab.  LillyCares Fax: [phone] and BICares Fax: [phone].  Called [email or web address]  [facility] with patient present and enrolled  him in mail order. Credit card placed on file. Pt aware he must call pharmacy 1 week before running out of medications (given all are too soon to fill at this time).    Future Appointments  Date Time Provider Department Center  04/29/2023  9:00 AM Joaquim Nam, MD [facility]  05/14/2023  9:15 AM Candelaria Stagers, DPM [facility]  07/05/2023  8:45 AM [facility] [place] VISIT 1 [facility]    Loree Fee, PharmD Clinical Pharmacist [facility] [phone]

## 2023-02-19 NOTE — Progress Notes (Signed)
Patient called 02/17/23 to inform me that he has received his Jardiance from [facility]. No further concerns at this time.   He also states he will drop off renewal applications to clinic Wednesday and would like to review them with pharmacist.

## 2023-02-22 ENCOUNTER — Other Ambulatory Visit (HOSPITAL_COMMUNITY): Payer: Self-pay

## 2023-02-23 DIAGNOSIS — Z85828 Personal history of other malignant neoplasm of skin: Secondary | ICD-10-CM | POA: Diagnosis not present

## 2023-02-23 DIAGNOSIS — L821 Other seborrheic keratosis: Secondary | ICD-10-CM | POA: Diagnosis not present

## 2023-02-23 DIAGNOSIS — Z08 Encounter for follow-up examination after completed treatment for malignant neoplasm: Secondary | ICD-10-CM | POA: Diagnosis not present

## 2023-02-23 DIAGNOSIS — L578 Other skin changes due to chronic exposure to nonionizing radiation: Secondary | ICD-10-CM | POA: Diagnosis not present

## 2023-02-23 DIAGNOSIS — L570 Actinic keratosis: Secondary | ICD-10-CM | POA: Diagnosis not present

## 2023-02-23 DIAGNOSIS — L814 Other melanin hyperpigmentation: Secondary | ICD-10-CM | POA: Diagnosis not present

## 2023-02-24 ENCOUNTER — Other Ambulatory Visit (HOSPITAL_COMMUNITY): Payer: Self-pay

## 2023-02-24 ENCOUNTER — Other Ambulatory Visit: Payer: Medicare Other | Admitting: Pharmacist

## 2023-02-24 NOTE — Patient Instructions (Addendum)
Regarding your Mail Order Medications:  Please call the pharmacy about a week before you run out of medication.  [facility] [phone]  The pharmacy will bill any copays less than $50. They will call you for any copays over $50 to see what you would like to do.    To stop receiving refills from your previous mail order pharmacy, I recommend contacting the pharmacy directly and requesting this. [facility] Call [phone], option 1  Monday - Friday 7 a.m. to 9 p.m. [place] Weekend 9 a.m. to 5:30 p.m. [place]

## 2023-03-01 ENCOUNTER — Other Ambulatory Visit: Payer: Self-pay

## 2023-03-01 ENCOUNTER — Telehealth: Payer: Self-pay

## 2023-03-01 ENCOUNTER — Other Ambulatory Visit (HOSPITAL_COMMUNITY): Payer: Self-pay

## 2023-03-01 NOTE — Telephone Encounter (Signed)
Called patient left vm for patient to return call. Calling to confirm what dosage of jardance the patient is taking 10mg  or 25mg .

## 2023-03-02 DIAGNOSIS — E119 Type 2 diabetes mellitus without complications: Secondary | ICD-10-CM | POA: Diagnosis not present

## 2023-03-02 LAB — HM DIABETES EYE EXAM

## 2023-03-02 NOTE — Telephone Encounter (Signed)
Left voicemail for patient to return call to office. 

## 2023-03-03 ENCOUNTER — Other Ambulatory Visit: Payer: Self-pay

## 2023-03-03 ENCOUNTER — Other Ambulatory Visit (HOSPITAL_COMMUNITY): Payer: Self-pay

## 2023-03-03 NOTE — Telephone Encounter (Signed)
Patient returned call and said that he takes 10mg  of jardance.

## 2023-03-09 ENCOUNTER — Other Ambulatory Visit: Payer: Self-pay

## 2023-03-23 ENCOUNTER — Other Ambulatory Visit (HOSPITAL_COMMUNITY): Payer: Self-pay

## 2023-03-24 ENCOUNTER — Other Ambulatory Visit: Payer: Self-pay

## 2023-03-24 ENCOUNTER — Other Ambulatory Visit (HOSPITAL_COMMUNITY): Payer: Self-pay

## 2023-03-29 ENCOUNTER — Encounter: Payer: Self-pay | Admitting: Pharmacist

## 2023-03-29 ENCOUNTER — Other Ambulatory Visit: Payer: Self-pay

## 2023-03-29 ENCOUNTER — Other Ambulatory Visit (HOSPITAL_COMMUNITY): Payer: Self-pay

## 2023-03-29 NOTE — Progress Notes (Addendum)
 Manufacturer Assistance Program (MAP) Application   Manufacturer: Secretary/administrator    (Re-enrollment) Medication(s): Trulicity  Patient Portion of Application:  10/31: Mailed to patient home by CPhT Team 12/16: Completed with patient via online enrollment tool.  Case number [id number]  Income Documentation: N/A - Electronic verification elected.  Provider Portion of Application:  12/16: Provider portion completed by PharmD and faxed to clinic for review and signature. 1/16: Provider portion never completed.  1/16: Provider portion completed online.  Prescription(s): Forwarded to PCP Clinical Pool for Electronic Rx sent to [facility])  Application Status: APPROVED through 2025 05/10/23: [employer]. They report that they have received all documents though they had not finished processing them. Reports that they will combine portions of the application and will process today. Rep confirms that all portions have been received.  05/13/23: Called [employer] and confirmed enrollment has been approved through 2025. [facility] confirms they see the approval. Trulicity refill started today. Report should ship ~Mon/Tues next week.   Next Steps: [x]    Trulicity refill > [facility]  [x]    PCP signature [x]    Upon signature(s) Application to be faxed to [facility] Fax: [phone] AND scanned into patient chart  Forwarded to [facility] CPhT Patient Advocate Team for future correspondences/re-enrollment.  Note routed to PCP Clinic Pool to ensure PCP signature is obtained and application is faxed.  *[facility] clinic team - Please Addend/update this note as the "Next Steps" are completed in office*

## 2023-03-30 ENCOUNTER — Other Ambulatory Visit: Payer: Self-pay

## 2023-03-31 NOTE — Progress Notes (Signed)
Thank you.  I will work on the hard copy when I am back in clinic.

## 2023-04-04 ENCOUNTER — Other Ambulatory Visit: Payer: Self-pay | Admitting: Family Medicine

## 2023-04-04 MED ORDER — TRULICITY 3 MG/0.5ML ~~LOC~~ SOAJ: 3.0000 mg | SUBCUTANEOUS | 3 refills | Status: DC

## 2023-04-05 NOTE — Progress Notes (Signed)
Patient assistance form signed and faxed 04/05/23

## 2023-04-06 ENCOUNTER — Other Ambulatory Visit (HOSPITAL_COMMUNITY): Payer: Self-pay

## 2023-04-11 ENCOUNTER — Other Ambulatory Visit: Payer: Self-pay | Admitting: Family Medicine

## 2023-04-11 MED ORDER — TRULICITY 3 MG/0.5ML ~~LOC~~ SOAJ: 3.0000 mg | SUBCUTANEOUS | 3 refills | Status: DC

## 2023-04-11 NOTE — Progress Notes (Signed)
See hard copy note.  I printed trulicity rx. Please pull for me to sign and then send with the hard copy form.  Thanks.

## 2023-04-13 ENCOUNTER — Other Ambulatory Visit: Payer: Self-pay

## 2023-04-13 ENCOUNTER — Other Ambulatory Visit (HOSPITAL_COMMUNITY): Payer: Self-pay

## 2023-04-13 NOTE — Progress Notes (Signed)
Faxed along with paperwork.

## 2023-04-19 ENCOUNTER — Other Ambulatory Visit: Payer: Self-pay

## 2023-04-21 ENCOUNTER — Other Ambulatory Visit (HOSPITAL_BASED_OUTPATIENT_CLINIC_OR_DEPARTMENT_OTHER): Payer: Self-pay

## 2023-04-21 ENCOUNTER — Other Ambulatory Visit (HOSPITAL_COMMUNITY): Payer: Self-pay

## 2023-04-21 ENCOUNTER — Other Ambulatory Visit: Payer: Self-pay

## 2023-04-22 ENCOUNTER — Other Ambulatory Visit: Payer: Self-pay

## 2023-04-29 ENCOUNTER — Encounter: Payer: Self-pay | Admitting: Family Medicine

## 2023-04-29 ENCOUNTER — Ambulatory Visit: Payer: Medicare Other | Admitting: Family Medicine

## 2023-04-29 VITALS — BP 128/68 | HR 81 | Temp 98.4°F | Ht 73.0 in | Wt 206.4 lb

## 2023-04-29 DIAGNOSIS — Z7984 Long term (current) use of oral hypoglycemic drugs: Secondary | ICD-10-CM | POA: Diagnosis not present

## 2023-04-29 DIAGNOSIS — E1142 Type 2 diabetes mellitus with diabetic polyneuropathy: Secondary | ICD-10-CM

## 2023-04-29 LAB — POCT GLYCOSYLATED HEMOGLOBIN (HGB A1C): Hemoglobin A1C: 7.5 % — AB (ref 4.0–5.6)

## 2023-04-29 NOTE — Progress Notes (Signed)
Diabetes:  Using medications without difficulties: yes Hypoglycemic episodes:no Hyperglycemic episodes:no Feet problems: see exam.  Blood Sugars averaging: 100-150 eye exam within last year: yes A1c done at [facility].  D/w pt.  Improved slightly.    He needs help with [employer] application and med assistance and meter supplies.   He had podiatry eval in the meantime. Per podiatry-"-Discussed with him eventually patient may need sesamoidectomy given the location of the submetatarsal 1 ulceration I discussed with patient he will think about it"  He has podiatry f/u pending.   Meds, vitals, and allergies reviewed.   ROS: Per HPI unless specifically indicated in ROS section   GEN: nad, alert and oriented HEENT: ncat NECK: supple w/o LA CV: rrr. PULM: ctab, no inc wob ABD: soft, +bs EXT: no edema SKIN: no acute rash  Diabetic foot exam: Normal inspection except for R 5th amputation.  No skin breakdown 2 cm thin callous on the L foot with central 5mm opening that doesn't appear infected.   Normal DP pulses Normal sensation to light touch but dec to monofilament Nails thickened.

## 2023-04-29 NOTE — Patient Instructions (Addendum)
Don't change your meds for now.  I'll check with Zink in the meantime.  Keep the appointment with podiatry and recheck at a yearly visit in about 3 months.   Take care.  Glad to see you.

## 2023-05-02 NOTE — Assessment & Plan Note (Signed)
He is going to f/u with podiatry.  The callous looks thinner than prev and doesn't appear infected so I think it makes sense to defer debridement today and keep it clean and covered in the meantime.  A1c is reasonable for now.  The main issue is that he needs help with [employer] [facility] application and also with medication assistance/delivery with med packaging and also his meter supplies.  I am going to ask for pharmacy help and I will send the hardcopy to pharmacy staff.  I greatly appreciate the help of all involved, specialty pharmacy staff.  Given his relative stability of his blood sugar readings.  I do not think it makes sense to change his Trulicity or glipizide or insulin currently.  Recheck periodically.

## 2023-05-10 ENCOUNTER — Other Ambulatory Visit (INDEPENDENT_AMBULATORY_CARE_PROVIDER_SITE_OTHER): Payer: Medicare Other | Admitting: Pharmacist

## 2023-05-10 ENCOUNTER — Encounter: Payer: Self-pay | Admitting: Pharmacist

## 2023-05-10 ENCOUNTER — Ambulatory Visit: Payer: Self-pay | Admitting: Family Medicine

## 2023-05-10 DIAGNOSIS — E1142 Type 2 diabetes mellitus with diabetic polyneuropathy: Secondary | ICD-10-CM

## 2023-05-10 NOTE — Telephone Encounter (Signed)
Copied from CRM [id number]. Topic: Clinical - Medication Question >> May 10, 2023  2:24 PM Irine Seal wrote: Reason for CRM: he would like a call back to discuss his medication [phone]  Patient stated that his medication was changed when he had to come back to [place], and he is wanting his diabetes medication to go back to what it was because his sugar has been running 190-200 in the mornings, he said his injections are fine, but tablets he doesn't think they work like his previous regimen, he was unsure of exactly what medication it was but stated that daily he is taking    amLODIPine Besylate 5 mg Oral Daily  Atorvastatin Calcium 80 mg Oral Every evening  Dulaglutide 3 mg Subcutaneous Weekly  Empagliflozin 10 mg Oral Daily before breakfast  glipiZIDE 5 MG Take 2 tablets (10 mg total) by mouth in the morning AND 1 tablet (5 mg total) every evening.  hydroCHLOROthiazide 12.5 mg Oral Daily  Ibuprofen 800 mg Oral Every 6 hours PRN  Insulin Degludec 40 Units Subcutaneous Daily  Lisinopril 30 mg Oral Every evening

## 2023-05-10 NOTE — Progress Notes (Unsigned)
Patient left a voicemail requesting call back  He reports that he feels like he may be out of a medication. States when he was filling meds at [facility], he has 4 pills in the morning and a couple at night. Now he reports he feels that he has less pills.  We reviewed his medication list together including shipment dates of chronic meds from [facility]. Most meds shipped out 04/22/23 x30 day supply.  However, glipizide was last shipped in December for a 90 day supply. He reports this is the medication he thought he was missing. He will look around when he gets home for this (reports he has a bag that he puts pill bottles in and he may have tossed it in there by mistake.    2. Reports his glucometer broke and he would like a new one if it is covered by insurance and a refill of his test strips.  OneTouch glucometer sent to [facility] to see if insurance will cover replacement   3. Reports he is out of Ehrhardt sensors, has been out for 2 weeks. Has not received any shipments. He believes he was told this would be shipped automatically each month.   4. Reports he is paying $70/month for his [place] at [place].  Advised him that insulin is only capped at $35 for 30 day supply or less [facility] does not break insulin boxes, and therefore is likely billing for >30 days resulting in copay consistent with 2 month supply.  Evaristo Bury refilled to [employer] initiated for Racheal Patches, PharmD Clinical Pharmacist [facility] [phone]

## 2023-05-10 NOTE — Progress Notes (Addendum)
 Manufacturer Assistance Program (MAP) Application   Manufacturer: [employer]    (New enrollment) Medication(s): Evaristo Bury U200  Patient Portion of Application:  05/10/23: Completed with patient via online enrollment tool. Submitted.  Income Documentation: N/A - Electronic verification elected.  Provider Portion of Application:  05/10/23: Provider portion completed by PharmD and uploaded PCP eFax folder for signature. Clinical Pool cc'd  Prescription(s): Included in MAP application.   Application Status:  APPROVED through 04/12/2024   Forwarded to [facility] CPhT Patient Advocate Team for future correspondences/re-enrollment.  Note routed to PCP Clinic Pool to ensure PCP signature is obtained and application is faxed.  *[facility] clinic team - Please Addend/update this note as the "Next Steps" are completed in office*

## 2023-05-11 NOTE — Progress Notes (Unsigned)
Placed in your tray

## 2023-05-12 ENCOUNTER — Other Ambulatory Visit (HOSPITAL_COMMUNITY): Payer: Self-pay

## 2023-05-12 ENCOUNTER — Other Ambulatory Visit: Payer: Self-pay

## 2023-05-12 ENCOUNTER — Encounter (HOSPITAL_COMMUNITY): Payer: Self-pay

## 2023-05-12 MED ORDER — TRESIBA FLEXTOUCH 200 UNIT/ML ~~LOC~~ SOPN
40.0000 [IU] | PEN_INJECTOR | Freq: Every day | SUBCUTANEOUS | 5 refills | Status: DC
  Filled 2023-05-12: qty 6, fill #0
  Filled 2023-05-12: qty 6, 30d supply, fill #0

## 2023-05-12 MED ORDER — BLOOD GLUCOSE MONITOR SYSTEM W/DEVICE KIT
PACK | 0 refills | Status: DC
  Filled 2023-05-12: qty 1, 30d supply, fill #0
  Filled 2023-05-12: qty 1, fill #0
  Filled 2023-05-12: qty 1, 30d supply, fill #0

## 2023-05-12 NOTE — Telephone Encounter (Signed)
My understanding is this was addressed prev by Zink with pharmacy.  Thanks.

## 2023-05-12 NOTE — Telephone Encounter (Signed)
Are you aware of this

## 2023-05-12 NOTE — Progress Notes (Signed)
Agree. Thanks

## 2023-05-12 NOTE — Progress Notes (Signed)
I'll work on the hard copy.  Thanks.

## 2023-05-12 NOTE — Telephone Encounter (Signed)
See note from [place]

## 2023-05-14 ENCOUNTER — Ambulatory Visit: Payer: Medicare Other | Admitting: Podiatry

## 2023-05-14 ENCOUNTER — Telehealth: Payer: Self-pay | Admitting: Urology

## 2023-05-14 ENCOUNTER — Other Ambulatory Visit: Payer: Self-pay

## 2023-05-14 DIAGNOSIS — M25872 Other specified joint disorders, left ankle and foot: Secondary | ICD-10-CM

## 2023-05-14 DIAGNOSIS — L603 Nail dystrophy: Secondary | ICD-10-CM

## 2023-05-14 DIAGNOSIS — Z01818 Encounter for other preprocedural examination: Secondary | ICD-10-CM

## 2023-05-14 DIAGNOSIS — L97521 Non-pressure chronic ulcer of other part of left foot limited to breakdown of skin: Secondary | ICD-10-CM | POA: Diagnosis not present

## 2023-05-14 NOTE — Telephone Encounter (Signed)
I spoke with pt about  Dulaglutide (TRULICITY) 3 MG/0.5ML SOAJ  and  empagliflozin (JARDIANCE) 10 MG TABS tablet , when they need to be stopped before surgery and highlighted that information on his paper work, or sx will be rescheduled. Pt stated understanding.

## 2023-05-14 NOTE — Progress Notes (Unsigned)
Subjective:  Patient ID: Christian [name], male    DOB: 05/05/1940,  MRN: [id number]  Chief Complaint  Patient presents with   [facility]    [facility] with callous and open sore on the left great.  Last A1c 8.3 back in 07/2023. No  anti coags.     83 y.o. male presents with the above complaint.  Patient presents with complaint left submetatarsal 1 ulceration superficial.  He wanted to get it evaluated he has not seen anyone as prior to seeing me.  His surgical site is doing good.  He denies any other acute issues.  Pain scale is 5 out of 10 dull aching nature   Review of Systems: Negative except as noted in the HPI. Denies N/V/F/Ch.  Past Medical History:  Diagnosis Date   Arthritis    Blood transfusion without reported diagnosis    ? in 2017 after crushing injury    Cataract    right eye removed    Crushing injury of pelvic region    Diabetes mellitus without complication (HCC)    Hyperlipidemia    Hypertension     Current Outpatient Medications:    amLODipine (NORVASC) 5 MG tablet, Take 1 tablet (5 mg total) by mouth daily., Disp: 180 tablet, Rfl: 2   atorvastatin (LIPITOR) 80 MG tablet, Take 1 tablet (80 mg total) by mouth every evening., Disp: 90 tablet, Rfl: 2   Blood Glucose Monitoring Suppl (BLOOD GLUCOSE MONITOR SYSTEM) w/Device KIT, Use as directed to check blood sugar., Disp: 1 kit, Rfl: 0   Continuous Blood Gluc Receiver (FREESTYLE LIBRE 2 READER) DEVI, Use with sensors to monitor sugar continuously, Disp: 1 each, Rfl: 0   Continuous Glucose Sensor (FREESTYLE LIBRE 2 SENSOR) MISC, USE TO MONITOR BLOOD SUGAR. CHANGE SENSOR EVERY 14 DAYS., Disp: 2 each, Rfl: 10   Dulaglutide (TRULICITY) 3 MG/0.5ML SOAJ, Inject 3 mg into the skin once a week., Disp: 6 mL, Rfl: 3   empagliflozin (JARDIANCE) 10 MG TABS tablet, Take 1 tablet (10 mg total) by mouth daily before breakfast., Disp: 90 tablet, Rfl: 3   glipiZIDE (GLUCOTROL) 5 MG tablet, Take 2 tablets (10 mg total) by mouth in the morning AND 1  tablet (5 mg total) every evening., Disp: 270 tablet, Rfl: 2   glucose blood (ONETOUCH ULTRA) test strip, USE TO check blood glucose daily AS DIRECTED, Disp: 100 strip, Rfl: 3   hydrochlorothiazide (HYDRODIURIL) 12.5 MG tablet, Take 1 tablet (12.5 mg total) by mouth daily., Disp: 90 tablet, Rfl: 2   ibuprofen (ADVIL) 800 MG tablet, TAKE 1 TABLET BY MOUTH EVERY 6 HOURS AS NEEDED, Disp: 60 tablet, Rfl: 10   insulin degludec (TRESIBA FLEXTOUCH) 200 UNIT/ML FlexTouch Pen, Inject 40 Units into the skin daily., Disp: 6 mL, Rfl: 5   Insulin Pen Needle (COMFORT EZ PEN NEEDLES) 31G X 8 MM MISC, Inject 1 each into the skin daily. Use once daily to deliver insulin injection, Disp: 100 each, Rfl: 3   lisinopril (ZESTRIL) 30 MG tablet, Take 1 tablet (30 mg total) by mouth every evening., Disp: 90 tablet, Rfl: 3  Social History   Tobacco Use  Smoking Status Never  Smokeless Tobacco Never    No Known Allergies Objective:  There were no vitals filed for this visit. There is no height or weight on file to calculate BMI. Constitutional Well developed. Well nourished.  Vascular Dorsalis pedis pulses palpable bilaterally. Posterior tibial pulses palpable bilaterally. Capillary refill normal to all digits.  No cyanosis or clubbing  noted. Pedal hair growth normal.  Neurologic Normal speech. Oriented to person, place, and time. Epicritic sensation to light touch grossly present bilaterally.  Dermatologic Nails*left submetatarsal 1 ulceration limited to the breakdown of the skin No purulent drainage noted no malodor present no redness noted.  Does not probe down to deep tissue.  Pain on palpation to the sesamoidal complex.  Left hallux nail dystrophy noted.  Orthopedic: Normal joint ROM without pain or crepitus bilaterally. No visible deformities. No bony tenderness.   Radiographs: None Assessment:   1. Foot ulcer, limited to breakdown of skin, left (HCC)   2. Sesamoiditis of left foot   3. Nail  dystrophy     Plan:  Patient was evaluated and treated and all questions answered.  Left submetatarsal 1 ulceration limited to the breakdown of the skin with underlying sesamoiditis and left hallux nail dystrophy -All questions and concerns were discussed with the patient extensive detail -Clinically at this time patient is a high risk for undergoing amputation if not prophylactically intervened via surgery.  I discussed with the patient that patient will benefit from removal of tibial and fibular sesamoid to create more space in the submetatarsal 1 and therefore allow the ulceration to heal.  I discussed with the patient is a diabetic's with A1c of almost 8%.  I discussed with the patient that he is a high risk of undergoing amputation as well.  He states understanding like to proceed with surgery despite all the risks -Informed surgical risk consent was reviewed and read aloud to the patient.  I reviewed the films.  I have discussed my findings with the patient in great detail.  I have discussed all risks including but not limited to infection, stiffness, scarring, limp, disability, deformity, damage to blood vessels and nerves, numbness, poor healing, need for braces, arthritis, chronic pain, amputation, death.  All benefits and realistic expectations discussed in great detail.  I have made no promises as to the outcome.  I have provided realistic expectations.  I have offered the patient a 2nd opinion, which they have declined and assured me they preferred to proceed despite the risks  Left hallux nail dystrophy -I explained the patient the etiology of nail dystrophy and worse treatment options were discussed.  Given the amount of pain that he is having he will benefit from removal with phenol matricectomy he states understanding would like to proceed with that  No follow-ups on file.

## 2023-05-14 NOTE — Progress Notes (Signed)
Paper work signed and faxed 05/14/23 and placed in pharmacist folder

## 2023-05-17 ENCOUNTER — Telehealth: Payer: Self-pay | Admitting: Podiatry

## 2023-05-17 NOTE — Telephone Encounter (Signed)
DOS-06/14/23  SESAMOIDECTOMY [id number] EXC. NAILPERM [id number]   UHC EFFECTIVE DATE-04/14/23  DEDUCTIBLE- $0.00 OOP-$3900.00 WITH REMAINING $3900.00 COINSURANCE-0%  PER THE UHC PORTAL, PRIOR AUTH IS NOT REQUIRED FOR CPT CODES [id number].  AUTH Decision ID #: [id number]

## 2023-05-18 ENCOUNTER — Other Ambulatory Visit: Payer: Self-pay

## 2023-05-19 ENCOUNTER — Other Ambulatory Visit: Payer: Self-pay

## 2023-05-20 ENCOUNTER — Other Ambulatory Visit (HOSPITAL_COMMUNITY): Payer: Self-pay

## 2023-05-27 ENCOUNTER — Other Ambulatory Visit: Payer: Self-pay

## 2023-06-03 ENCOUNTER — Telehealth: Payer: Self-pay

## 2023-06-03 NOTE — Telephone Encounter (Signed)
 PAP: Patient assistance application for Evaristo Bury has been approved by PAP Companies: [employer] from 05/13/2023 to 04/12/2024. Medication should be delivered to PAP Delivery: Provider's office. For further shipping updates, please [employer] at [phone]. Patient ID is: not provided

## 2023-06-07 ENCOUNTER — Other Ambulatory Visit: Payer: Self-pay

## 2023-06-10 ENCOUNTER — Other Ambulatory Visit: Payer: Self-pay

## 2023-06-14 ENCOUNTER — Other Ambulatory Visit: Payer: Self-pay

## 2023-06-14 ENCOUNTER — Other Ambulatory Visit: Payer: Self-pay | Admitting: Podiatry

## 2023-06-14 DIAGNOSIS — M84872 Other disorders of continuity of bone, left ankle and foot: Secondary | ICD-10-CM | POA: Diagnosis not present

## 2023-06-14 DIAGNOSIS — M25872 Other specified joint disorders, left ankle and foot: Secondary | ICD-10-CM | POA: Diagnosis not present

## 2023-06-14 DIAGNOSIS — L600 Ingrowing nail: Secondary | ICD-10-CM | POA: Diagnosis not present

## 2023-06-14 HISTORY — PX: FOOT SURGERY: SHX648

## 2023-06-14 MED ORDER — IBUPROFEN 800 MG PO TABS: 800.0000 mg | ORAL_TABLET | Freq: Four times a day (QID) | ORAL | 1 refills | Status: AC | PRN

## 2023-06-14 MED ORDER — OXYCODONE-ACETAMINOPHEN 5-325 MG PO TABS
1.0000 | ORAL_TABLET | ORAL | 0 refills | Status: DC | PRN
Start: 2023-06-14 — End: 2023-08-02

## 2023-06-17 ENCOUNTER — Other Ambulatory Visit: Payer: Self-pay

## 2023-06-23 ENCOUNTER — Other Ambulatory Visit: Payer: Self-pay

## 2023-06-23 ENCOUNTER — Ambulatory Visit (INDEPENDENT_AMBULATORY_CARE_PROVIDER_SITE_OTHER)

## 2023-06-23 ENCOUNTER — Ambulatory Visit (INDEPENDENT_AMBULATORY_CARE_PROVIDER_SITE_OTHER): Payer: Medicare Other | Admitting: Podiatry

## 2023-06-23 ENCOUNTER — Other Ambulatory Visit (HOSPITAL_COMMUNITY): Payer: Self-pay

## 2023-06-23 DIAGNOSIS — Z9889 Other specified postprocedural states: Secondary | ICD-10-CM

## 2023-06-23 DIAGNOSIS — M25872 Other specified joint disorders, left ankle and foot: Secondary | ICD-10-CM | POA: Diagnosis not present

## 2023-06-23 MED ORDER — DOXYCYCLINE HYCLATE 100 MG PO TABS
100.0000 mg | ORAL_TABLET | Freq: Two times a day (BID) | ORAL | 0 refills | Status: DC
  Filled 2023-06-23 (×2): qty 28, 14d supply, fill #0

## 2023-06-23 NOTE — Progress Notes (Signed)
 Subjective:  Patient ID: Christian [name], male    DOB: 1940/06/29,  MRN: [id number]  Chief Complaint  Patient presents with   Routine Post Op    DOS 06/14/23 --- LEFT TOTAL NAIL AVULSION ITH PHENOL MATRIXECTOMY, LEFT 1ST SESEMOIDECTOMY OF TIBAL/FIBULAR REMOVAL    DOS: 06/14/2023 Procedure: Left sesamoidectomy and left total nail avulsion permanently  83 y.o. male returns for post-op check.  He states is doing okay minimal pain mild redness no acute complaints.  Bandages clean dry and intact  Review of Systems: Negative except as noted in the HPI. Denies N/V/F/Ch.  Past Medical History:  Diagnosis Date   Arthritis    Blood transfusion without reported diagnosis    ? in 2017 after crushing injury    Cataract    right eye removed    Crushing injury of pelvic region    Diabetes mellitus without complication (HCC)    Hyperlipidemia    Hypertension     Current Outpatient Medications:    doxycycline (VIBRA-TABS) 100 MG tablet, Take 1 tablet (100 mg total) by mouth 2 (two) times daily., Disp: 28 tablet, Rfl: 0   amLODipine (NORVASC) 5 MG tablet, Take 1 tablet (5 mg total) by mouth daily., Disp: 180 tablet, Rfl: 2   atorvastatin (LIPITOR) 80 MG tablet, Take 1 tablet (80 mg total) by mouth every evening., Disp: 90 tablet, Rfl: 2   Blood Glucose Monitoring Suppl (BLOOD GLUCOSE MONITOR SYSTEM) w/Device KIT, Use as directed to check blood sugar., Disp: 1 kit, Rfl: 0   Continuous Blood Gluc Receiver (FREESTYLE LIBRE 2 READER) DEVI, Use with sensors to monitor sugar continuously, Disp: 1 each, Rfl: 0   Continuous Glucose Sensor (FREESTYLE LIBRE 2 SENSOR) MISC, USE TO MONITOR BLOOD SUGAR. CHANGE SENSOR EVERY 14 DAYS., Disp: 2 each, Rfl: 10   Dulaglutide (TRULICITY) 3 MG/0.5ML SOAJ, Inject 3 mg into the skin once a week., Disp: 6 mL, Rfl: 3   empagliflozin (JARDIANCE) 10 MG TABS tablet, Take 1 tablet (10 mg total) by mouth daily before breakfast., Disp: 90 tablet, Rfl: 3   glipiZIDE (GLUCOTROL) 5 MG  tablet, Take 2 tablets (10 mg total) by mouth in the morning AND 1 tablet (5 mg total) every evening., Disp: 270 tablet, Rfl: 2   glucose blood (ONETOUCH ULTRA) test strip, USE TO check blood glucose daily AS DIRECTED, Disp: 100 strip, Rfl: 3   hydrochlorothiazide (HYDRODIURIL) 12.5 MG tablet, Take 1 tablet (12.5 mg total) by mouth daily., Disp: 90 tablet, Rfl: 2   ibuprofen (ADVIL) 800 MG tablet, TAKE 1 TABLET BY MOUTH EVERY 6 HOURS AS NEEDED, Disp: 60 tablet, Rfl: 10   ibuprofen (ADVIL) 800 MG tablet, Take 1 tablet (800 mg total) by mouth every 6 (six) hours as needed., Disp: 60 tablet, Rfl: 1   insulin degludec (TRESIBA FLEXTOUCH) 200 UNIT/ML FlexTouch Pen, Inject 40 Units into the skin daily., Disp: 6 mL, Rfl: 5   Insulin Pen Needle (COMFORT EZ PEN NEEDLES) 31G X 8 MM MISC, Inject 1 each into the skin daily. Use once daily to deliver insulin injection, Disp: 100 each, Rfl: 3   lisinopril (ZESTRIL) 30 MG tablet, Take 1 tablet (30 mg total) by mouth every evening., Disp: 90 tablet, Rfl: 3   oxyCODONE-acetaminophen (PERCOCET) 5-325 MG tablet, Take 1 tablet by mouth every 4 (four) hours as needed for severe pain (pain score 7-10)., Disp: 30 tablet, Rfl: 0  Social History   Tobacco Use  Smoking Status Never  Smokeless Tobacco Never  No Known Allergies Objective:  There were no vitals filed for this visit. There is no height or weight on file to calculate BMI. Constitutional Well developed. Well nourished.  Vascular Foot warm and well perfused. Capillary refill normal to all digits.   Neurologic Normal speech. Oriented to person, place, and time. Epicritic sensation to light touch grossly present bilaterally.  Dermatologic Skin healing well without signs of infection. Skin edges well coapted without signs of infection.  Orthopedic: Tenderness to palpation noted about the surgical site.   Radiographs: 3 views of skeletally mature left foot: Sesamoid was removed no complication noted  good alignment of the hallux noted Assessment:   1. Sesamoiditis of left foot   2. S/P foot surgery    Plan:  Patient was evaluated and treated and all questions answered.  S/p foot surgery left -Progressing as expected post-operatively. -XR: See above -WB Status: Weightbearing as tolerated to the heel in surgical shoe -Sutures: Intact.  No clinical signs of dehiscence noted no complication noted. -Medications: None -Foot redressed.  No follow-ups on file.

## 2023-06-24 ENCOUNTER — Other Ambulatory Visit (HOSPITAL_COMMUNITY): Payer: Self-pay

## 2023-06-28 ENCOUNTER — Telehealth: Payer: Self-pay

## 2023-06-28 NOTE — Telephone Encounter (Signed)
 Patient also had 4 boxes of Tresiba to come in as well.

## 2023-06-28 NOTE — Telephone Encounter (Signed)
 Called patient to advise that he has 2 boxes of the novofine 32g tip needles available for pickup. Located at [employer] [facility]

## 2023-07-05 ENCOUNTER — Other Ambulatory Visit: Payer: Self-pay

## 2023-07-05 ENCOUNTER — Ambulatory Visit (INDEPENDENT_AMBULATORY_CARE_PROVIDER_SITE_OTHER): Payer: Medicare Other

## 2023-07-05 VITALS — BP 128/68 | Ht 73.0 in | Wt 210.0 lb

## 2023-07-05 DIAGNOSIS — Z0000 Encounter for general adult medical examination without abnormal findings: Secondary | ICD-10-CM | POA: Diagnosis not present

## 2023-07-05 NOTE — Progress Notes (Signed)
 Because this visit was a virtual/telehealth visit,  certain criteria was not obtained, such a blood pressure, CBG if applicable, and timed get up and go. Any medications not marked as "taking" were not mentioned during the medication reconciliation part of the visit. Any vitals not documented were not able to be obtained due to this being a telehealth visit or patient was unable to self-report a recent blood pressure reading due to a lack of equipment at home via telehealth. Vitals that have been documented are verbally provided by the patient.   Subjective:   Christian [name] is a 83 y.o. who presents for a Medicare Wellness preventive visit.  Visit Complete: Virtual I connected with  Christian [name] on 07/05/23 by a audio enabled telemedicine application and verified that I am speaking with the correct person using two identifiers.  Patient Location: Home  Provider Location: Home Office  I discussed the limitations of evaluation and management by telemedicine. The patient expressed understanding and agreed to proceed.  Vital Signs: Because this visit was a virtual/telehealth visit, some criteria may be missing or patient reported. Any vitals not documented were not able to be obtained and vitals that have been documented are patient reported.  VideoDeclined- This patient declined Librarian, academic. Therefore the visit was completed with audio only.  Persons Participating in Visit: Patient.  AWV Questionnaire: No: Patient Medicare AWV questionnaire was not completed prior to this visit.  Cardiac Risk Factors include: hypertension;male gender;diabetes mellitus;advanced age (>54men, >7 women);dyslipidemia     Objective:    Today's Vitals   07/05/23 0834  BP: 128/68  Weight: 210 lb (95.3 kg)  Height: 6\' 1"  (1.854 m)   Body mass index is 27.71 kg/m.     07/05/2023    9:11 AM 06/30/2022   10:16 AM 06/24/2021    2:05 PM 08/18/2020   10:27 PM 06/03/2016    3:32  PM 05/17/2016    5:43 AM 03/05/2016    5:21 PM  Advanced Directives  Does Patient Have a Medical Advance Directive? Yes Yes No No No No No  Type of Estate agent of [place];Living will Healthcare Power of [place];Living will       Does patient want to make changes to medical advance directive? No - Patient declined        Copy of Healthcare Power of Attorney in Chart? No - copy requested No - copy requested       Would patient like information on creating a medical advance directive?   Yes (MAU/Ambulatory/Procedural Areas - Information given) No - Patient declined  No - Patient declined Yes (Inpatient - patient requests chaplain consult to create a medical advance directive)    Current Medications (verified) Outpatient Encounter Medications as of 07/05/2023  Medication Sig   amLODipine (NORVASC) 5 MG tablet Take 1 tablet (5 mg total) by mouth daily.   atorvastatin (LIPITOR) 80 MG tablet Take 1 tablet (80 mg total) by mouth every evening.   Blood Glucose Monitoring Suppl (BLOOD GLUCOSE MONITOR SYSTEM) w/Device KIT Use as directed to check blood sugar.   Continuous Blood Gluc Receiver (FREESTYLE LIBRE 2 READER) DEVI Use with sensors to monitor sugar continuously   Continuous Glucose Sensor (FREESTYLE LIBRE 2 SENSOR) MISC USE TO MONITOR BLOOD SUGAR. CHANGE SENSOR EVERY 14 DAYS.   doxycycline (VIBRA-TABS) 100 MG tablet Take 1 tablet (100 mg total) by mouth 2 (two) times daily.   Dulaglutide (TRULICITY) 3 MG/0.5ML SOAJ Inject 3 mg into the  skin once a week.   empagliflozin (JARDIANCE) 10 MG TABS tablet Take 1 tablet (10 mg total) by mouth daily before breakfast.   glipiZIDE (GLUCOTROL) 5 MG tablet Take 2 tablets (10 mg total) by mouth in the morning AND 1 tablet (5 mg total) every evening.   glucose blood (ONETOUCH ULTRA) test strip USE TO check blood glucose daily AS DIRECTED   hydrochlorothiazide (HYDRODIURIL) 12.5 MG tablet Take 1 tablet (12.5 mg total) by mouth daily.    ibuprofen (ADVIL) 800 MG tablet TAKE 1 TABLET BY MOUTH EVERY 6 HOURS AS NEEDED   ibuprofen (ADVIL) 800 MG tablet Take 1 tablet (800 mg total) by mouth every 6 (six) hours as needed.   insulin degludec (TRESIBA FLEXTOUCH) 200 UNIT/ML FlexTouch Pen Inject 40 Units into the skin daily.   Insulin Pen Needle (COMFORT EZ PEN NEEDLES) 31G X 8 MM MISC Inject 1 each into the skin daily. Use once daily to deliver insulin injection   lisinopril (ZESTRIL) 30 MG tablet Take 1 tablet (30 mg total) by mouth every evening.   oxyCODONE-acetaminophen (PERCOCET) 5-325 MG tablet Take 1 tablet by mouth every 4 (four) hours as needed for severe pain (pain score 7-10).   No facility-administered encounter medications on file as of 07/05/2023.    Allergies (verified) Patient has no known allergies.   History: Past Medical History:  Diagnosis Date   Arthritis    Blood transfusion without reported diagnosis    ? in 2017 after crushing injury    Cataract    right eye removed    Crushing injury of pelvic region    Diabetes mellitus without complication (HCC)    Hyperlipidemia    Hypertension    Past Surgical History:  Procedure Laterality Date   ACHILLES TENDON SURGERY Right    AMPUTATION Right 08/21/2020   Procedure: RIGHT FOOT 5TH RAY AMPUTATION;  Surgeon: Nadara Mustard, MD;  Location: [facility] OR;  Service: Orthopedics;  Laterality: Right;   APPLICATION OF A-CELL OF BACK N/A 02/10/2016   Procedure: APPLICATION OF A-CELL OF sacral wound;  Surgeon: Alena Bills Dillingham, DO;  Location: [facility] OR;  Service: Plastics;  Laterality: N/A;   APPLICATION OF WOUND VAC N/A 02/10/2016   Procedure: APPLICATION OF WOUND VAC possible;  Surgeon: Alena Bills Dillingham, DO;  Location: [facility] OR;  Service: Plastics;  Laterality: N/A;   CATARACT EXTRACTION Left 05/28/2021   COLONOSCOPY     I & D EXTREMITY N/A 01/30/2016   Procedure: IRRIGATION AND DEBRIDEMENT OF THE SACRUM;  Surgeon: Alena Bills Dillingham, DO;  Location: [facility] OR;  Service:  Plastics;  Laterality: N/A;   INCISION AND DRAINAGE OF WOUND N/A 02/10/2016   Procedure: IRRIGATION AND DEBRIDEMENT sacral WOUND;  Surgeon: Alena Bills Dillingham, DO;  Location: [facility] OR;  Service: Plastics;  Laterality: N/A;   IR GENERIC HISTORICAL  02/11/2016   IR CATHETER TUBE CHANGE 02/11/2016 Irish Lack, MD [facility]   IR GENERIC HISTORICAL  03/16/2016   IR CATHETER TUBE CHANGE 03/16/2016 Simonne Come, MD [facility]INTER[facility]   IR GENERIC HISTORICAL  05/11/2016   IR CATHETER TUBE CHANGE 05/11/2016 Irish Lack, MD [facility]   POLYPECTOMY     SACRO-ILIAC PINNING N/A 01/16/2016   Procedure: Terrilee Croak;  Surgeon: Myrene Galas, MD;  Location: [facility] OR;  Service: Orthopedics;  Laterality: N/A;   SUPRAPUBIC CATHETER INSERTION     s/p removal 2018   TONSILLECTOMY     ULNAR NERVE TRANSPOSITION Right    Family History  Problem Relation Age  of Onset   Lymphoma Mother    CVA Father    Dementia Father    Diabetes Neg Hx    Prostate cancer Neg Hx    Colon polyps Neg Hx    Colon cancer Neg Hx    Rectal cancer Neg Hx    Stomach cancer Neg Hx    Esophageal cancer Neg Hx    Social History   Socioeconomic History   Marital status: Married    Spouse name: Not on file   Number of children: Not on file   Years of education: Not on file   Highest education level: Not on file  Occupational History   Not on file  Tobacco Use   Smoking status: Never   Smokeless tobacco: Never  Substance and Sexual Activity   Alcohol use: No   Drug use: No   Sexual activity: Not Currently  Other Topics Concern   Not on file  Social History Narrative   Married 2015   Retired, previous mission work   Research officer, trade union   Social Drivers of Corporate investment banker Strain: Low Risk  (07/05/2023)   Overall Financial Resource Strain (CARDIA)    Difficulty of Paying Living Expenses: Not hard at all  Food Insecurity: No Food Insecurity (07/05/2023)   Hunger Vital Sign    Worried About  Running Out of Food in the Last Year: Never true    Ran Out of Food in the Last Year: Never true  Transportation Needs: No Transportation Needs (07/05/2023)   PRAPARE - Administrator, Civil Service (Medical): No    Lack of Transportation (Non-Medical): No  Physical Activity: Inactive (07/05/2023)   Exercise Vital Sign    Days of Exercise per Week: 0 days    Minutes of Exercise per Session: 0 min  Stress: No Stress Concern Present (07/05/2023)   [employer] of Occupational Health - Occupational Stress Questionnaire    Feeling of Stress : Not at all  Social Connections: Moderately Integrated (07/05/2023)   Social Connection and Isolation Panel [NHANES]    Frequency of Communication with Friends and Family: More than three times a week    Frequency of Social Gatherings with Friends and Family: More than three times a week    Attends Religious Services: More than 4 times per year    Active Member of [employer] or Organizations: No    Attends Engineer, structural: Never    Marital Status: Married    Tobacco Counseling Counseling given: Not Answered    Clinical Intake:  Pre-visit preparation completed: Yes  Pain : No/denies pain     BMI - recorded: 27.71 Nutritional Status: BMI 25 -29 Overweight Nutritional Risks: None Diabetes: Yes CBG done?: No Did pt. bring in CBG monitor from home?: No  Lab Results  Component Value Date   HGBA1C 7.5 (A) 04/29/2023   HGBA1C 7.6 (A) 01/26/2023   HGBA1C 8.3 (H) 10/19/2022     How often do you need to have someone help you when you read instructions, pamphlets, or other written materials from your doctor or pharmacy?: 1 - Never  Interpreter Needed?: No  Information entered by :: Nehemiah Settle [facility]   Activities of Daily Living     07/05/2023    8:54 AM  In your present state of health, do you have any difficulty performing the following activities:  Hearing? 1  Comment hearing aids  Vision? 0  Difficulty  concentrating or making decisions? 0  Walking or climbing  stairs? 0  Dressing or bathing? 0  Doing errands, shopping? 0  Preparing Food and eating ? N  Using the Toilet? N  In the past six months, have you accidently leaked urine? N  Do you have problems with loss of bowel control? N  Managing your Medications? N  Managing your Finances? N  Housekeeping or managing your Housekeeping? N    Patient Care Team: Joaquim Nam, MD as PCP - General (Family Medicine) Sinda Du, MD as Consulting Physician (Ophthalmology)  Indicate any recent Medical Services you may have received from other than [facility] providers in the past year (date may be approximate).     Assessment:   This is a routine wellness examination for Adlai.  Hearing/Vision screen Hearing Screening - Comments:: Hearing aids  Vision Screening - Comments:: Patient only has eye glasses to read     Goals Addressed             This Visit's Progress    Patient Stated       Patient wants to be safe        Depression Screen     07/05/2023    8:39 AM 10/26/2022    8:09 AM 07/27/2022    9:10 AM 06/30/2022   10:16 AM 04/27/2022    8:40 AM 06/24/2021    2:09 PM 04/21/2021   10:02 AM  PHQ 2/9 Scores  PHQ - 2 Score 0 0 0 0 0 0 0  PHQ- 9 Score 0 0 0  0      Fall Risk     07/05/2023    9:11 AM 10/26/2022    8:09 AM 07/27/2022    9:09 AM 06/30/2022   10:12 AM 04/27/2022    8:39 AM  Fall Risk   Falls in the past year? 0 0 0 0 0  Number falls in past yr: 0 0 0 0 0  Injury with Fall? 0 0 0 0 0  Risk for fall due to : No Fall Risks Impaired balance/gait No Fall Risks No Fall Risks No Fall Risks  Follow up Falls evaluation completed Falls evaluation completed Falls evaluation completed Falls prevention discussed;Falls evaluation completed Falls evaluation completed    MEDICARE RISK AT HOME:  Medicare Risk at Home Any stairs in or around the home?: Yes If so, are there any without handrails?: No Home free of loose  throw rugs in walkways, pet beds, electrical cords, etc?: No Adequate lighting in your home to reduce risk of falls?: Yes Life alert?: No Use of a cane, walker or w/c?: Yes (cane) Grab bars in the bathroom?: Yes Shower chair or bench in shower?: No Elevated toilet seat or a handicapped toilet?: No  TIMED UP AND GO:  Was the test performed?  No  Cognitive Function: 6CIT completed    06/03/2016    3:31 PM  MMSE - Mini Mental State Exam  Orientation to time 5  Orientation to Place 5  Registration 3  Attention/ Calculation 0  Recall 3  Language- name 2 objects 0  Language- repeat 1  Language- follow 3 step command 3  Language- read & follow direction 0  Write a sentence 0  Copy design 0  Total score 20        07/05/2023    8:49 AM 06/30/2022   10:19 AM 06/24/2021    2:14 PM  6CIT Screen  What Year? 0 points 0 points 0 points  What month? 0 points 0 points 0 points  What time? 0 points 0 points 0 points  Count back from 20 0 points 0 points 0 points  Months in reverse 0 points 0 points 0 points  Repeat phrase 0 points 0 points 0 points  Total Score 0 points 0 points 0 points    Immunizations Immunization History  Administered Date(s) Administered   Fluad Quad(high Dose 65+) 01/05/2019, 01/09/2020, 04/01/2021, 02/03/2022   Fluad Trivalent(High Dose 65+) 01/26/2023   Influenza,inj,Quad PF,6+ Mos 02/20/2013, 01/03/2014, 02/05/2015, 01/15/2016, 01/17/2016, 03/26/2016, 02/02/2017   Influenza-Unspecified 04/13/2010, 01/11/2018   PFIZER(Purple Top)SARS-COV-2 Vaccination 05/23/2019, 06/17/2019   PNEUMOCOCCAL CONJUGATE-20 07/23/2021   Pneumococcal Conjugate-13 01/06/2012, 01/03/2014   Pneumococcal Polysaccharide-23 12/19/2012   Td 05/14/2014   Tdap 07/23/2021   Zoster Recombinant(Shingrix) 10/27/2018, 01/31/2019   Zoster, Live 12/19/2014    Screening Tests Health Maintenance  Topic Date Due   Diabetic kidney evaluation - Urine ACR  05/09/2023   Diabetic kidney  evaluation - eGFR measurement  07/20/2023   HEMOGLOBIN A1C  10/27/2023   OPHTHALMOLOGY EXAM  03/01/2024   FOOT EXAM  04/28/2024   Medicare Annual Wellness (AWV)  07/04/2024   DTaP/Tdap/Td (3 - Td or Tdap) 07/24/2031   Pneumonia Vaccine 68+ Years old  Completed   INFLUENZA VACCINE  Completed   Zoster Vaccines- Shingrix  Completed   HPV VACCINES  Aged Out   COVID-19 Vaccine  Discontinued    Health Maintenance  Health Maintenance Due  Topic Date Due   Diabetic kidney evaluation - Urine ACR  05/09/2023   Diabetic kidney evaluation - eGFR measurement  07/20/2023   Health Maintenance Items Addressed: patient will get diabetic kidney evaluation at the office  Additional Screening:  Vision Screening: Recommended annual ophthalmology exams for early detection of glaucoma and other disorders of the eye.  Dental Screening: Recommended annual dental exams for proper oral hygiene  Community Resource Referral / Chronic Care Management: CRR required this visit?  No   CCM required this visit?  No     Plan:     I have personally reviewed and noted the following in the patient's chart:   Medical and social history Use of alcohol, tobacco or illicit drugs  Current medications and supplements including opioid prescriptions. Patient is not currently taking opioid prescriptions. Functional ability and status Nutritional status Physical activity Advanced directives List of other physicians Hospitalizations, surgeries, and ER visits in previous 12 months Vitals Screenings to include cognitive, depression, and falls Referrals and appointments  In addition, I have reviewed and discussed with patient certain preventive protocols, quality metrics, and best practice recommendations. A written personalized care plan for preventive services as well as general preventive health recommendations were provided to patient.     Rudi Heap, [place]   07/05/2023   After Visit Summary:  (MyChart) Due to this being a telephonic visit, the after visit summary with patients personalized plan was offered to patient via MyChart   Notes: Nothing significant to report at this time.

## 2023-07-05 NOTE — Patient Instructions (Signed)
 [name] , Thank you for taking time to come for your Medicare Wellness Visit. I appreciate your ongoing commitment to your health goals. Please review the following plan we discussed and let me know if I can assist you in the future.   Referrals/Orders/Follow-Ups/Clinician Recommendations:   This is a list of the screening recommended for you and due dates:  Health Maintenance  Topic Date Due   Yearly kidney health urinalysis for diabetes  05/09/2023   Yearly kidney function blood test for diabetes  07/20/2023   Hemoglobin A1C  10/27/2023   Eye exam for diabetics  03/01/2024   Complete foot exam   04/28/2024   Medicare Annual Wellness Visit  07/04/2024   DTaP/Tdap/Td vaccine (3 - Td or Tdap) 07/24/2031   Pneumonia Vaccine  Completed   Flu Shot  Completed   Zoster (Shingles) Vaccine  Completed   HPV Vaccine  Aged Out   COVID-19 Vaccine  Discontinued    Advanced directives: (Copy Requested) Please bring a copy of your health care power of attorney and living will to the office to be added to your chart at your convenience. You can mail to [facility] [address] [place], [place] [address] or email to [email or web address]  Next Medicare Annual Wellness Visit scheduled for next year: Yes

## 2023-07-06 ENCOUNTER — Other Ambulatory Visit: Payer: Self-pay

## 2023-07-07 ENCOUNTER — Ambulatory Visit (INDEPENDENT_AMBULATORY_CARE_PROVIDER_SITE_OTHER): Payer: Medicare Other | Admitting: Podiatry

## 2023-07-07 DIAGNOSIS — M25872 Other specified joint disorders, left ankle and foot: Secondary | ICD-10-CM

## 2023-07-07 DIAGNOSIS — Z9889 Other specified postprocedural states: Secondary | ICD-10-CM

## 2023-07-07 NOTE — Progress Notes (Signed)
 Subjective:  Patient ID: Christian [name], male    DOB: 1940/09/11,  MRN: [id number]  Chief Complaint  Patient presents with   Routine Post Op    DOS 06/14/23 --- LEFT TOTAL NAIL AVULSION ITH PHENOL MATRIXECTOMY, LEFT 1ST SESEMOIDECTOMY OF TIBAL/FIBULAR REMOVAL    DOS: 06/14/2023 Procedure: Left sesamoidectomy and left total nail avulsion permanently  83 y.o. male returns for post-op check.  He states is doing okay minimal pain mild redness no acute complaints.  Bandages clean dry and intact  Review of Systems: Negative except as noted in the HPI. Denies N/V/F/Ch.  Past Medical History:  Diagnosis Date   Arthritis    Blood transfusion without reported diagnosis    ? in 2017 after crushing injury    Cataract    right eye removed    Crushing injury of pelvic region    Diabetes mellitus without complication (HCC)    Hyperlipidemia    Hypertension     Current Outpatient Medications:    amLODipine (NORVASC) 5 MG tablet, Take 1 tablet (5 mg total) by mouth daily., Disp: 180 tablet, Rfl: 2   atorvastatin (LIPITOR) 80 MG tablet, Take 1 tablet (80 mg total) by mouth every evening., Disp: 90 tablet, Rfl: 2   Blood Glucose Monitoring Suppl (BLOOD GLUCOSE MONITOR SYSTEM) w/Device KIT, Use as directed to check blood sugar., Disp: 1 kit, Rfl: 0   Continuous Blood Gluc Receiver (FREESTYLE LIBRE 2 READER) DEVI, Use with sensors to monitor sugar continuously, Disp: 1 each, Rfl: 0   Continuous Glucose Sensor (FREESTYLE LIBRE 2 SENSOR) MISC, USE TO MONITOR BLOOD SUGAR. CHANGE SENSOR EVERY 14 DAYS., Disp: 2 each, Rfl: 10   doxycycline (VIBRA-TABS) 100 MG tablet, Take 1 tablet (100 mg total) by mouth 2 (two) times daily., Disp: 28 tablet, Rfl: 0   Dulaglutide (TRULICITY) 3 MG/0.5ML SOAJ, Inject 3 mg into the skin once a week., Disp: 6 mL, Rfl: 3   empagliflozin (JARDIANCE) 10 MG TABS tablet, Take 1 tablet (10 mg total) by mouth daily before breakfast., Disp: 90 tablet, Rfl: 3   glipiZIDE (GLUCOTROL) 5 MG  tablet, Take 2 tablets (10 mg total) by mouth in the morning AND 1 tablet (5 mg total) every evening., Disp: 270 tablet, Rfl: 2   glucose blood (ONETOUCH ULTRA) test strip, USE TO check blood glucose daily AS DIRECTED, Disp: 100 strip, Rfl: 3   hydrochlorothiazide (HYDRODIURIL) 12.5 MG tablet, Take 1 tablet (12.5 mg total) by mouth daily., Disp: 90 tablet, Rfl: 2   ibuprofen (ADVIL) 800 MG tablet, TAKE 1 TABLET BY MOUTH EVERY 6 HOURS AS NEEDED, Disp: 60 tablet, Rfl: 10   ibuprofen (ADVIL) 800 MG tablet, Take 1 tablet (800 mg total) by mouth every 6 (six) hours as needed., Disp: 60 tablet, Rfl: 1   insulin degludec (TRESIBA FLEXTOUCH) 200 UNIT/ML FlexTouch Pen, Inject 40 Units into the skin daily., Disp: 6 mL, Rfl: 5   Insulin Pen Needle (COMFORT EZ PEN NEEDLES) 31G X 8 MM MISC, Inject 1 each into the skin daily. Use once daily to deliver insulin injection, Disp: 100 each, Rfl: 3   lisinopril (ZESTRIL) 30 MG tablet, Take 1 tablet (30 mg total) by mouth every evening., Disp: 90 tablet, Rfl: 3   oxyCODONE-acetaminophen (PERCOCET) 5-325 MG tablet, Take 1 tablet by mouth every 4 (four) hours as needed for severe pain (pain score 7-10)., Disp: 30 tablet, Rfl: 0  Social History   Tobacco Use  Smoking Status Never  Smokeless Tobacco Never  No Known Allergies Objective:  There were no vitals filed for this visit. There is no height or weight on file to calculate BMI. Constitutional Well developed. Well nourished.  Vascular Foot warm and well perfused. Capillary refill normal to all digits.   Neurologic Normal speech. Oriented to person, place, and time. Epicritic sensation to light touch grossly present bilaterally.  Dermatologic Skin completely epithelialized.  No signs of dehiscence noted.  Left hallux granular wound bed noted.  No signs of purulent drainage noted no infection noted.  Orthopedic: Tenderness to palpation noted about the surgical site.   Radiographs: 3 views of skeletally  mature left foot: Sesamoid was removed no complication noted good alignment of the hallux noted Assessment:   No diagnosis found.  Plan:  Patient was evaluated and treated and all questions answered.  S/p foot surgery left -Clinically healed from sesamoiditis.  He still has superficial wound to the left great toe.  For which I encouraged him to keep it covered with Neosporin and a Band-Aid.  He states understanding no deep wounds noted at this time  No follow-ups on file.

## 2023-07-12 NOTE — Telephone Encounter (Signed)
 Reached out to patient because he did not pick up his needles when he picked up the [place]. When the patient returns for pickup they are at my desk in the top drawer.

## 2023-07-28 ENCOUNTER — Other Ambulatory Visit: Payer: Self-pay

## 2023-07-30 ENCOUNTER — Other Ambulatory Visit: Payer: Self-pay

## 2023-08-02 ENCOUNTER — Ambulatory Visit (INDEPENDENT_AMBULATORY_CARE_PROVIDER_SITE_OTHER): Payer: Medicare Other | Admitting: Family Medicine

## 2023-08-02 ENCOUNTER — Other Ambulatory Visit (HOSPITAL_COMMUNITY): Payer: Self-pay

## 2023-08-02 ENCOUNTER — Other Ambulatory Visit: Payer: Self-pay

## 2023-08-02 ENCOUNTER — Encounter: Payer: Self-pay | Admitting: Family Medicine

## 2023-08-02 VITALS — BP 116/72 | HR 77 | Temp 97.6°F | Ht 73.0 in | Wt 204.0 lb

## 2023-08-02 DIAGNOSIS — Z0000 Encounter for general adult medical examination without abnormal findings: Secondary | ICD-10-CM

## 2023-08-02 DIAGNOSIS — Z7985 Long-term (current) use of injectable non-insulin antidiabetic drugs: Secondary | ICD-10-CM | POA: Diagnosis not present

## 2023-08-02 DIAGNOSIS — E785 Hyperlipidemia, unspecified: Secondary | ICD-10-CM

## 2023-08-02 DIAGNOSIS — Z7189 Other specified counseling: Secondary | ICD-10-CM | POA: Diagnosis not present

## 2023-08-02 DIAGNOSIS — M5416 Radiculopathy, lumbar region: Secondary | ICD-10-CM | POA: Diagnosis not present

## 2023-08-02 DIAGNOSIS — Z7984 Long term (current) use of oral hypoglycemic drugs: Secondary | ICD-10-CM | POA: Diagnosis not present

## 2023-08-02 DIAGNOSIS — E1142 Type 2 diabetes mellitus with diabetic polyneuropathy: Secondary | ICD-10-CM

## 2023-08-02 DIAGNOSIS — I10 Essential (primary) hypertension: Secondary | ICD-10-CM | POA: Diagnosis not present

## 2023-08-02 DIAGNOSIS — Z794 Long term (current) use of insulin: Secondary | ICD-10-CM | POA: Diagnosis not present

## 2023-08-02 LAB — LIPID PANEL
Cholesterol: 143 mg/dL (ref 0–200)
HDL: 31.8 mg/dL — ABNORMAL LOW (ref >39.00)
LDL Cholesterol: 48 mg/dL (ref 0–99)
NonHDL: 110.78
Total CHOL/HDL Ratio: 4
Triglycerides: 312 mg/dL — ABNORMAL HIGH (ref 0.0–149.0)
VLDL: 62.4 mg/dL — ABNORMAL HIGH (ref 0.0–40.0)

## 2023-08-02 LAB — COMPREHENSIVE METABOLIC PANEL WITH GFR
ALT: 9 U/L (ref 0–53)
AST: 13 U/L (ref 0–37)
Albumin: 4.2 g/dL (ref 3.5–5.2)
Alkaline Phosphatase: 89 U/L (ref 39–117)
BUN: 25 mg/dL — ABNORMAL HIGH (ref 6–23)
CO2: 21 meq/L (ref 19–32)
Calcium: 9.3 mg/dL (ref 8.4–10.5)
Chloride: 107 meq/L (ref 96–112)
Creatinine, Ser: 1.35 mg/dL (ref 0.40–1.50)
GFR: 49.05 mL/min — ABNORMAL LOW (ref >60.00)
Glucose, Bld: 139 mg/dL — ABNORMAL HIGH (ref 70–99)
Potassium: 4.3 meq/L (ref 3.5–5.1)
Sodium: 141 meq/L (ref 135–145)
Total Bilirubin: 1 mg/dL (ref 0.2–1.2)
Total Protein: 7.3 g/dL (ref 6.0–8.3)

## 2023-08-02 LAB — CBC WITH DIFFERENTIAL/PLATELET
Basophils Absolute: 0.1 10*3/uL (ref 0.0–0.1)
Basophils Relative: 1.1 % (ref 0.0–3.0)
Eosinophils Absolute: 0.2 10*3/uL (ref 0.0–0.7)
Eosinophils Relative: 2.8 % (ref 0.0–5.0)
HCT: 41.3 % (ref 39.0–52.0)
Hemoglobin: 13.7 g/dL (ref 13.0–17.0)
Lymphocytes Relative: 21.8 % (ref 12.0–46.0)
Lymphs Abs: 1.8 10*3/uL (ref 0.7–4.0)
MCHC: 33.1 g/dL (ref 30.0–36.0)
MCV: 88.9 fl (ref 78.0–100.0)
Monocytes Absolute: 0.6 10*3/uL (ref 0.1–1.0)
Monocytes Relative: 7 % (ref 3.0–12.0)
Neutro Abs: 5.5 10*3/uL (ref 1.4–7.7)
Neutrophils Relative %: 67.3 % (ref 43.0–77.0)
Platelets: 248 10*3/uL (ref 150.0–400.0)
RBC: 4.64 Mil/uL (ref 4.22–5.81)
RDW: 14.2 % (ref 11.5–15.5)
WBC: 8.1 10*3/uL (ref 4.0–10.5)

## 2023-08-02 LAB — HEMOGLOBIN A1C: Hgb A1c MFr Bld: 7.9 % — ABNORMAL HIGH (ref 4.6–6.5)

## 2023-08-02 LAB — VITAMIN B12: Vitamin B-12: 108 pg/mL — ABNORMAL LOW (ref 211–911)

## 2023-08-02 LAB — TSH: TSH: 4.26 u[IU]/mL (ref 0.35–5.50)

## 2023-08-02 MED ORDER — ATORVASTATIN CALCIUM 20 MG PO TABS
20.0000 mg | ORAL_TABLET | Freq: Every evening | ORAL | 3 refills | Status: DC
  Filled 2023-08-02: qty 90, 90d supply, fill #0
  Filled 2023-08-02: qty 30, 30d supply, fill #0
  Filled 2023-09-17: qty 30, 30d supply, fill #1
  Filled 2023-11-02: qty 30, 30d supply, fill #2
  Filled 2023-12-03: qty 30, 30d supply, fill #3
  Filled 2024-01-05: qty 30, 30d supply, fill #4

## 2023-08-02 MED ORDER — ATORVASTATIN CALCIUM 80 MG PO TABS: 40.0000 mg | ORAL_TABLET | Freq: Every evening | ORAL | Status: DC

## 2023-08-02 NOTE — Progress Notes (Unsigned)
 His wife is dealing with constant pain.  Per patient's report, his wife has fluctuating memory.  Discussed.    Diabetes:  Using medications without difficulties: yes Hypoglycemic episodes: no Hyperglycemic episodes:no Feet problems:  he had surgery then had lesion on the L 1st toe.  See exam   Blood Sugars averaging: ~120s recently in the AM.  eye exam within last year: yes Prev MALB d/w pt.  Labs pending.   Elevated Cholesterol: Using medications without problems: yes Muscle aches: some cramping at night.  Walking helps. He had already cut his atorvastatin  from 80 to 40mg .   Diet compliance: d/w pt.  Exercise: d/w pt.   Hypertension:    Using medication without problems or lightheadedness: yes Chest pain with exertion:no Edema:some BLE edema at baseline.  Short of breath: no  Flu 2024 Shingles 2020 PNA up-to-date Tetanus 2016 covid vaccine prev done.  RSV vaccine d/w pt.   Colonoscopy 2021 Prostate cancer screening declined.  Discussed with patient. Advance directive-wife and oldest daughter [name] equally designated if patient were incapacitated.  Numbness in the R leg.  Noted since summer 2024.  He was asking about seeing [facility].  Referral placed.  No falls-using cane to compensate.    Prev MRI with  IMPRESSION: Moderate spinal canal narrowing at the L3-4, L4-5 level.  Moderate to severe bilateral L1-2, right L2-4, bilateral L4-5 and left L5-S1 neural foraminal narrowing.  Meds, vitals, and allergies reviewed.   ROS: Per HPI unless specifically indicated in ROS section   GEN: nad, alert and oriented HEENT: ncat NECK: supple w/o LA CV: rrr. PULM: ctab, no inc wob ABD: soft, +bs EXT: no edema in the shins. L foot slightly puffy SKIN: well perfused with paresthesia in the R thigh   L foot slightly puffy.  L1st nail absent with healing wound at the prev nail bed.  Covered with nonstick pad and dressed.  H/o R 5th toe amputation.  Dec sensation to monofilament  but not light touch on the L foot.  Normal sensation R foot.   Normal DP pulse B.    Diabetic foot exam: Normal inspection except at above.  No skin breakdown except at above.  No calluses

## 2023-08-02 NOTE — Patient Instructions (Addendum)
 Go to the lab on the way out.   If you have mychart we'll likely use that to update you.    Urine test today or when possible.    Keep using a nonstick bandage and the spot should heal over.  Take care.  Glad to see you.  Try cutting atorvastatin  down to 20mg  and see if the cramps get better.  Either way, let me know in about 2 weeks.    Recheck in about 3-4 months.  A1c at the visit.

## 2023-08-03 ENCOUNTER — Other Ambulatory Visit (INDEPENDENT_AMBULATORY_CARE_PROVIDER_SITE_OTHER)

## 2023-08-03 DIAGNOSIS — E1142 Type 2 diabetes mellitus with diabetic polyneuropathy: Secondary | ICD-10-CM

## 2023-08-04 ENCOUNTER — Ambulatory Visit (INDEPENDENT_AMBULATORY_CARE_PROVIDER_SITE_OTHER): Admitting: Podiatry

## 2023-08-04 ENCOUNTER — Encounter: Payer: Self-pay | Admitting: Family Medicine

## 2023-08-04 ENCOUNTER — Other Ambulatory Visit: Payer: Self-pay

## 2023-08-04 DIAGNOSIS — L97521 Non-pressure chronic ulcer of other part of left foot limited to breakdown of skin: Secondary | ICD-10-CM

## 2023-08-04 DIAGNOSIS — M5416 Radiculopathy, lumbar region: Secondary | ICD-10-CM | POA: Insufficient documentation

## 2023-08-04 LAB — MICROALBUMIN / CREATININE URINE RATIO
Creatinine,U: 77.3 mg/dL
Microalb Creat Ratio: UNDETERMINED mg/g (ref 0.0–30.0)
Microalb, Ur: <0.7 mg/dL

## 2023-08-04 MED ORDER — SANTYL 250 UNIT/GM EX OINT
1.0000 | TOPICAL_OINTMENT | Freq: Every day | CUTANEOUS | 0 refills | Status: DC
  Filled 2023-08-04 – 2023-08-09 (×2): qty 30, 30d supply, fill #0

## 2023-08-04 NOTE — Assessment & Plan Note (Signed)
 Flu 2024 Shingles 2020 PNA up-to-date Tetanus 2016 covid vaccine prev done.  RSV vaccine d/w pt.   Colonoscopy 2021 Prostate cancer screening declined.  Discussed with patient. Advance directive-wife and oldest daughter [name] equally designated if patient were incapacitated.

## 2023-08-04 NOTE — Assessment & Plan Note (Signed)
 Decrease atorvastatin  down to 20 mg and he will see if he notices less aches.  Either way he can update me.

## 2023-08-04 NOTE — Progress Notes (Signed)
 Subjective:  Patient ID: Christian [name], male    DOB: April 16, 1940,  MRN: [id number]  Chief Complaint  Patient presents with   Routine Post Op    DOS 06/14/23 --- LEFT TOTAL NAIL AVULSION ITH PHENOL MATRIXECTOMY, LEFT 1ST SESEMOIDECTOMY OF TIBAL/FIBULAR REMOVAL    DOS: 06/14/2023 Procedure: Left sesamoidectomy and left total nail avulsion permanently  83 y.o. male returns for post-op check.  He states is doing okay minimal pain mild redness no acute complaints.  Bandages clean dry and intact  Review of Systems: Negative except as noted in the HPI. Denies N/V/F/Ch.  Past Medical History:  Diagnosis Date   Arthritis    Blood transfusion without reported diagnosis    ? in 2017 after crushing injury    Cataract    right eye removed    Crushing injury of pelvic region    Diabetes mellitus without complication (HCC)    Hyperlipidemia    Hypertension     Current Outpatient Medications:    collagenase  (SANTYL ) 250 UNIT/GM ointment, Apply 1 Application topically daily. 1.5 cm x 1.5 cm x 0.3 cm wound, Disp: 30 g, Rfl: 0   amLODipine  (NORVASC ) 5 MG tablet, Take 1 tablet (5 mg total) by mouth daily., Disp: 180 tablet, Rfl: 2   atorvastatin  (LIPITOR ) 20 MG tablet, Take 1 tablet (20 mg total) by mouth every evening., Disp: 90 tablet, Rfl: 3   Blood Glucose Monitoring Suppl (BLOOD GLUCOSE MONITOR SYSTEM) w/Device KIT, Use as directed to check blood sugar., Disp: 1 kit, Rfl: 0   Continuous Blood Gluc Receiver (FREESTYLE LIBRE 2 READER) DEVI, Use with sensors to monitor sugar continuously, Disp: 1 each, Rfl: 0   Continuous Glucose Sensor (FREESTYLE LIBRE 2 SENSOR) MISC, USE TO MONITOR BLOOD SUGAR. CHANGE SENSOR EVERY 14 DAYS., Disp: 2 each, Rfl: 10   cyanocobalamin  (VITAMIN B12) 1000 MCG/ML injection, 1000mcg IM weekly x4 doses then monthly thereafter., Disp: 4 mL, Rfl: 1   Dulaglutide  (TRULICITY ) 3 MG/0.5ML SOAJ, Inject 3 mg into the skin once a week., Disp: 6 mL, Rfl: 3   empagliflozin  (JARDIANCE )  10 MG TABS tablet, Take 1 tablet (10 mg total) by mouth daily before breakfast., Disp: 90 tablet, Rfl: 3   glipiZIDE  (GLUCOTROL ) 5 MG tablet, Take 2 tablets (10 mg total) by mouth in the morning AND 1 tablet (5 mg total) every evening., Disp: 270 tablet, Rfl: 2   glucose blood (ONETOUCH ULTRA) test strip, USE TO check blood glucose daily AS DIRECTED, Disp: 100 strip, Rfl: 3   hydrochlorothiazide  (HYDRODIURIL ) 12.5 MG tablet, Take 1 tablet (12.5 mg total) by mouth daily., Disp: 90 tablet, Rfl: 2   ibuprofen  (ADVIL ) 800 MG tablet, Take 1 tablet (800 mg total) by mouth every 6 (six) hours as needed., Disp: 60 tablet, Rfl: 1   insulin  degludec (TRESIBA  FLEXTOUCH) 200 UNIT/ML FlexTouch Pen, Inject 40 Units into the skin daily., Disp: 6 mL, Rfl: 5   Insulin  Pen Needle (COMFORT EZ PEN NEEDLES) 31G X 8 MM MISC, Inject 1 each into the skin daily. Use once daily to deliver insulin  injection, Disp: 100 each, Rfl: 3   lisinopril  (ZESTRIL ) 30 MG tablet, Take 1 tablet (30 mg total) by mouth every evening., Disp: 90 tablet, Rfl: 3   SYRINGE-NEEDLE, DISP, 3 ML 25G X 1" 3 ML MISC, Use with B12 injections., Disp: 10 each, Rfl: 1  Social History   Tobacco Use  Smoking Status Never  Smokeless Tobacco Never    Allergies  Allergen Reactions   Lipitor  [  Atorvastatin ]     Max tolerated dose is 20mg  per day   Objective:  There were no vitals filed for this visit. There is no height or weight on file to calculate BMI. Constitutional Well developed. Well nourished.  Vascular Foot warm and well perfused. Capillary refill normal to all digits.   Neurologic Normal speech. Oriented to person, place, and time. Epicritic sensation to light touch grossly present bilaterally.  Dermatologic Skin completely epithelialized.  No signs of dehiscence noted.  Left hallux granular wound bed noted.  No signs of purulent drainage noted no infection noted.  Orthopedic: Tenderness to palpation noted about the surgical site.    Radiographs: 3 views of skeletally mature left foot: Sesamoid was removed no complication noted good alignment of the hallux noted Assessment:   1. Toe ulcer, left, limited to breakdown of skin [facility])     Plan:  Patient was evaluated and treated and all questions answered.  S/p foot surgery left -Clinically healed from sesamoiditis.  - Superficial wound dehiscence patient will start doing Santyl  wet-to-dry dressing 7 to was sent to the pharmacy.  No he is having difficult time healing the toenail avulsion.  He does have palpable pulses.  At this time no clinical infection I discussed with the patient if he regresses go to the emergency room right away he states understanding. - Santyl  was sent to the pharmacy1.5 cm x 1.5 cm x 0.3 cm.  No follow-ups on file.

## 2023-08-04 NOTE — Assessment & Plan Note (Signed)
 See notes on labs. Recheck in a few months with A1c at the visit.  Continue Trulicity  Jardiance  glipizide  and insulin  for now.

## 2023-08-04 NOTE — Assessment & Plan Note (Signed)
Advance directive-wife and oldest daughter Allison equally designated if patient were incapacitated. ?

## 2023-08-04 NOTE — Assessment & Plan Note (Signed)
 Continue lisinopril  hydrochlorothiazide  amlodipine .  See notes on labs.

## 2023-08-04 NOTE — Assessment & Plan Note (Signed)
 Refer to [facility] clinic.

## 2023-08-05 ENCOUNTER — Other Ambulatory Visit: Payer: Self-pay

## 2023-08-05 ENCOUNTER — Encounter: Payer: Self-pay | Admitting: Pharmacist

## 2023-08-05 DIAGNOSIS — Z09 Encounter for follow-up examination after completed treatment for conditions other than malignant neoplasm: Secondary | ICD-10-CM | POA: Diagnosis not present

## 2023-08-05 DIAGNOSIS — L570 Actinic keratosis: Secondary | ICD-10-CM | POA: Diagnosis not present

## 2023-08-05 DIAGNOSIS — L578 Other skin changes due to chronic exposure to nonionizing radiation: Secondary | ICD-10-CM | POA: Diagnosis not present

## 2023-08-05 DIAGNOSIS — L814 Other melanin hyperpigmentation: Secondary | ICD-10-CM | POA: Diagnosis not present

## 2023-08-05 DIAGNOSIS — L821 Other seborrheic keratosis: Secondary | ICD-10-CM | POA: Diagnosis not present

## 2023-08-05 DIAGNOSIS — D225 Melanocytic nevi of trunk: Secondary | ICD-10-CM | POA: Diagnosis not present

## 2023-08-08 ENCOUNTER — Encounter: Payer: Self-pay | Admitting: Family Medicine

## 2023-08-08 DIAGNOSIS — E538 Deficiency of other specified B group vitamins: Secondary | ICD-10-CM | POA: Insufficient documentation

## 2023-08-09 ENCOUNTER — Encounter: Payer: Self-pay | Admitting: Family Medicine

## 2023-08-09 ENCOUNTER — Other Ambulatory Visit: Payer: Self-pay | Admitting: Family Medicine

## 2023-08-09 ENCOUNTER — Other Ambulatory Visit (HOSPITAL_COMMUNITY): Payer: Self-pay

## 2023-08-09 ENCOUNTER — Other Ambulatory Visit: Payer: Self-pay | Admitting: Podiatry

## 2023-08-09 DIAGNOSIS — E538 Deficiency of other specified B group vitamins: Secondary | ICD-10-CM

## 2023-08-09 DIAGNOSIS — E1142 Type 2 diabetes mellitus with diabetic polyneuropathy: Secondary | ICD-10-CM

## 2023-08-09 MED ORDER — SYRINGE/NEEDLE (DISP) 25G X 1" 3 ML MISC: 1 refills | Status: DC

## 2023-08-09 MED ORDER — CYANOCOBALAMIN 1000 MCG/ML IJ SOLN: INTRAMUSCULAR | 1 refills | Status: DC

## 2023-08-12 ENCOUNTER — Telehealth: Payer: Self-pay

## 2023-08-12 NOTE — Telephone Encounter (Signed)
 Patient notified as to why I called. Closing encounter

## 2023-08-12 NOTE — Telephone Encounter (Signed)
 Copied from CRM [id number]. Topic: General - Call Back - No Documentation >> Aug 12, 2023 11:04 AM Allyne Areola wrote: Reason for CRM: Patient is returning a call he received from the office, Patient spoke with Avaletta yesterday. No documentation of a phone call today.

## 2023-08-13 ENCOUNTER — Other Ambulatory Visit (HOSPITAL_COMMUNITY): Payer: Self-pay

## 2023-08-17 ENCOUNTER — Other Ambulatory Visit: Payer: Self-pay | Admitting: Family Medicine

## 2023-08-17 DIAGNOSIS — M5116 Intervertebral disc disorders with radiculopathy, lumbar region: Secondary | ICD-10-CM | POA: Diagnosis not present

## 2023-08-17 DIAGNOSIS — M4807 Spinal stenosis, lumbosacral region: Secondary | ICD-10-CM | POA: Diagnosis not present

## 2023-08-17 DIAGNOSIS — M4317 Spondylolisthesis, lumbosacral region: Secondary | ICD-10-CM | POA: Diagnosis not present

## 2023-08-17 DIAGNOSIS — M5416 Radiculopathy, lumbar region: Secondary | ICD-10-CM | POA: Diagnosis not present

## 2023-08-17 DIAGNOSIS — E1142 Type 2 diabetes mellitus with diabetic polyneuropathy: Secondary | ICD-10-CM | POA: Diagnosis not present

## 2023-08-17 DIAGNOSIS — M4726 Other spondylosis with radiculopathy, lumbar region: Secondary | ICD-10-CM | POA: Diagnosis not present

## 2023-08-19 ENCOUNTER — Other Ambulatory Visit (HOSPITAL_COMMUNITY): Payer: Self-pay

## 2023-08-19 ENCOUNTER — Other Ambulatory Visit: Payer: Self-pay | Admitting: Podiatry

## 2023-08-19 ENCOUNTER — Other Ambulatory Visit: Payer: Self-pay

## 2023-08-19 MED ORDER — SANTYL 250 UNIT/GM EX OINT
1.0000 | TOPICAL_OINTMENT | Freq: Every day | CUTANEOUS | 0 refills | Status: AC
  Filled 2023-08-19: qty 30, 30d supply, fill #0

## 2023-08-24 ENCOUNTER — Other Ambulatory Visit: Payer: Self-pay | Admitting: Orthopedic Surgery

## 2023-08-24 DIAGNOSIS — M5410 Radiculopathy, site unspecified: Secondary | ICD-10-CM

## 2023-08-24 DIAGNOSIS — M25551 Pain in right hip: Secondary | ICD-10-CM

## 2023-08-26 ENCOUNTER — Other Ambulatory Visit: Payer: Self-pay | Admitting: Orthopedic Surgery

## 2023-08-26 DIAGNOSIS — M25551 Pain in right hip: Secondary | ICD-10-CM

## 2023-08-26 DIAGNOSIS — M48061 Spinal stenosis, lumbar region without neurogenic claudication: Secondary | ICD-10-CM

## 2023-08-26 DIAGNOSIS — M5410 Radiculopathy, site unspecified: Secondary | ICD-10-CM

## 2023-09-01 ENCOUNTER — Ambulatory Visit (INDEPENDENT_AMBULATORY_CARE_PROVIDER_SITE_OTHER): Admitting: Podiatry

## 2023-09-01 DIAGNOSIS — Z9889 Other specified postprocedural states: Secondary | ICD-10-CM

## 2023-09-01 DIAGNOSIS — M25872 Other specified joint disorders, left ankle and foot: Secondary | ICD-10-CM

## 2023-09-01 DIAGNOSIS — L97521 Non-pressure chronic ulcer of other part of left foot limited to breakdown of skin: Secondary | ICD-10-CM

## 2023-09-01 NOTE — Discharge Instructions (Signed)

## 2023-09-01 NOTE — Progress Notes (Signed)
 Subjective:  Patient ID: Christian [name], male    DOB: [birth year],  MRN: [id number]  Chief Complaint  Patient presents with   Routine Post Op    POV # 4 DOS 06/14/23 --- LEFT TOTAL NAIL AVULSION ITH PHENOL MATRIXECTOMY, LEFT 1ST SESEMOIDECTOMY OF TIBAL/FIBULAR REMOVAL Pt stated that he is doing much better he stated that he has no more leakage coming from the toe     DOS: 06/14/2023 Procedure: Left sesamoidectomy and left total nail avulsion permanently  83 y.o. male returns for post-op check.  He states is doing okay minimal pain mild redness no acute complaints.  Bandages clean dry and intact  Review of Systems: Negative except as noted in the HPI. Denies N/V/F/Ch.  Past Medical History:  Diagnosis Date   Arthritis    Blood transfusion without reported diagnosis    ? in 2017 after crushing injury    Cataract    right eye removed    Crushing injury of pelvic region    Diabetes mellitus without complication (HCC)    Hyperlipidemia    Hypertension     Current Outpatient Medications:    amLODipine  (NORVASC ) 5 MG tablet, Take 1 tablet (5 mg total) by mouth daily., Disp: 180 tablet, Rfl: 2   atorvastatin  (LIPITOR ) 20 MG tablet, Take 1 tablet (20 mg total) by mouth every evening., Disp: 90 tablet, Rfl: 3   Blood Glucose Monitoring Suppl (BLOOD GLUCOSE MONITOR SYSTEM) w/Device KIT, Use as directed to check blood sugar., Disp: 1 kit, Rfl: 0   collagenase  (SANTYL ) 250 UNIT/GM ointment, Apply 1 Application topically daily. 1.5 cm x 1.5 cm x 0.3 cm wound, Disp: 30 g, Rfl: 0   Continuous Blood Gluc Receiver (FREESTYLE LIBRE 2 READER) DEVI, Use with sensors to monitor sugar continuously, Disp: 1 each, Rfl: 0   Continuous Glucose Sensor (FREESTYLE LIBRE 2 SENSOR) MISC, USE TO MONITOR BLOOD SUGAR. CHANGE SENSOR EVERY 14 DAYS., Disp: 2 each, Rfl: 10   cyanocobalamin  (VITAMIN B12) 1000 MCG/ML injection, 1000mcg IM weekly x4 doses then monthly thereafter., Disp: 4 mL, Rfl: 1   Dulaglutide  (TRULICITY )  3 MG/0.5ML SOAJ, Inject 3 mg into the skin once a week., Disp: 6 mL, Rfl: 3   glipiZIDE  (GLUCOTROL ) 5 MG tablet, Take 2 tablets (10 mg total) by mouth in the morning AND 1 tablet (5 mg total) every evening., Disp: 270 tablet, Rfl: 2   glucose blood (ONETOUCH ULTRA) test strip, USE TO check blood glucose daily AS DIRECTED, Disp: 100 strip, Rfl: 3   hydrochlorothiazide  (HYDRODIURIL ) 12.5 MG tablet, Take 1 tablet (12.5 mg total) by mouth daily., Disp: 90 tablet, Rfl: 2   ibuprofen  (ADVIL ) 800 MG tablet, Take 1 tablet (800 mg total) by mouth every 6 (six) hours as needed., Disp: 60 tablet, Rfl: 1   insulin  degludec (TRESIBA  FLEXTOUCH) 200 UNIT/ML FlexTouch Pen, Inject 40 Units into the skin daily., Disp: 6 mL, Rfl: 5   Insulin  Pen Needle (COMFORT EZ PEN NEEDLES) 31G X 8 MM MISC, Inject 1 each into the skin daily. Use once daily to deliver insulin  injection, Disp: 100 each, Rfl: 3   JARDIANCE  10 MG TABS tablet, TAKE ONE TABLET BY MOUTH DAILY, Disp: 90 tablet, Rfl: 2   lisinopril  (ZESTRIL ) 30 MG tablet, Take 1 tablet (30 mg total) by mouth every evening., Disp: 90 tablet, Rfl: 3   SYRINGE-NEEDLE, DISP, 3 ML 25G X 1" 3 ML MISC, Use with B12 injections., Disp: 10 each, Rfl: 1  Social History   Tobacco Use  Smoking Status Never  Smokeless Tobacco Never    Allergies  Allergen Reactions   Lipitor  [Atorvastatin ]     Max tolerated dose is 20mg  per day   Objective:  There were no vitals filed for this visit. There is no height or weight on file to calculate BMI. Constitutional Well developed. Well nourished.  Vascular Foot warm and well perfused. Capillary refill normal to all digits.   Neurologic Normal speech. Oriented to person, place, and time. Epicritic sensation to light touch grossly present bilaterally.  Dermatologic Skin completely epithelialized.  No signs of dehiscence noted.  No further wound noted no signs of purulent drainage noted no infection noted.  Hammertoe contracture noted  semiflexible in nature.  Orthopedic: No Tenderness to palpation noted about the surgical site.   Radiographs: 3 views of skeletally mature left foot: Sesamoid was removed no complication noted good alignment of the hallux noted Assessment:   1. Toe ulcer, left, limited to breakdown of skin (HCC)   2. Sesamoiditis of left foot   3. S/P foot surgery      Plan:  Patient was evaluated and treated and all questions answered.  S/p foot surgery left - Clinically healed and officially discharged from the care of any foot and ankle issues in the future she will come back and see me.  He states understanding - Given that his ulceration has failed he will benefit from diabetic shoes given the presence of toe contracture with underlying diabetes patient was given prescription for [facility] orthotics.  No follow-ups on file.

## 2023-09-03 ENCOUNTER — Ambulatory Visit
Admission: RE | Admit: 2023-09-03 | Discharge: 2023-09-03 | Disposition: A | Discharge status: Home / Self Care | Source: Ambulatory Visit | Attending: Orthopedic Surgery | Admitting: Orthopedic Surgery

## 2023-09-03 DIAGNOSIS — M48061 Spinal stenosis, lumbar region without neurogenic claudication: Secondary | ICD-10-CM

## 2023-09-03 DIAGNOSIS — M4727 Other spondylosis with radiculopathy, lumbosacral region: Secondary | ICD-10-CM | POA: Diagnosis not present

## 2023-09-03 MED ORDER — IOPAMIDOL (ISOVUE-M 200) INJECTION 41%
1.0000 mL | Freq: Once | INTRAMUSCULAR | Status: AC
  Administered 2023-09-03: 1 mL via EPIDURAL

## 2023-09-03 MED ORDER — METHYLPREDNISOLONE ACETATE 40 MG/ML INJ SUSP (RADIOLOG
80.0000 mg | Freq: Once | INTRAMUSCULAR | Status: AC
  Administered 2023-09-03: 80 mg via EPIDURAL

## 2023-09-13 ENCOUNTER — Telehealth: Payer: Self-pay | Admitting: Podiatry

## 2023-09-13 DIAGNOSIS — M25872 Other specified joint disorders, left ankle and foot: Secondary | ICD-10-CM

## 2023-09-13 DIAGNOSIS — L97521 Non-pressure chronic ulcer of other part of left foot limited to breakdown of skin: Secondary | ICD-10-CM

## 2023-09-13 DIAGNOSIS — E0840 Diabetes mellitus due to underlying condition with diabetic neuropathy, unspecified: Secondary | ICD-10-CM

## 2023-09-13 NOTE — Telephone Encounter (Signed)
 Need to have prescription and demographics faxed to [facility] for DM shoes. TY

## 2023-09-14 ENCOUNTER — Other Ambulatory Visit: Payer: Self-pay | Admitting: Family Medicine

## 2023-09-14 NOTE — Telephone Encounter (Unsigned)
 Copied from CRM [id number]. Topic: Clinical - Medication Refill >> Sep 14, 2023  8:54 AM Howard Macho wrote: Medication: Dulaglutide  (TRULICITY ) 3 MG/0.5ML SOAJ Patient states he gets the medication from a company called [facility] patient assistance. Patient want to know if the doctor want him to continue taking if so he need a refill  Has the patient contacted their pharmacy? No (Agent: If no, request that the patient contact the pharmacy for the refill. If patient does not wish to contact the pharmacy document the reason why and proceed with request.) (Agent: If yes, when and what did the pharmacy advise?)  This is the patient's preferred pharmacy:  [facility] mail order service  Is this the correct pharmacy for this prescription? Yes If no, delete pharmacy and type the correct one.   Has the prescription been filled recently? No  Is the patient out of the medication? Yes  Has the patient been seen for an appointment in the last year OR does the patient have an upcoming appointment? Yes  Can we respond through MyChart? Yes  Agent: Please be advised that Rx refills may take up to 3 business days. We ask that you follow-up with your pharmacy.

## 2023-09-14 NOTE — Telephone Encounter (Signed)
Do you want the patient to continue this medication?

## 2023-09-15 MED ORDER — TRULICITY 3 MG/0.5ML ~~LOC~~ SOAJ: 3.0000 mg | SUBCUTANEOUS | 3 refills | Status: DC

## 2023-09-15 NOTE — Telephone Encounter (Signed)
 Left message to advise that prescription refill has been sent to the pharmacy

## 2023-09-15 NOTE — Telephone Encounter (Signed)
Yes, sent. Thanks.  ?

## 2023-09-17 ENCOUNTER — Encounter: Payer: Self-pay | Admitting: Pharmacist

## 2023-09-17 ENCOUNTER — Other Ambulatory Visit (HOSPITAL_COMMUNITY): Payer: Self-pay

## 2023-09-17 NOTE — Progress Notes (Signed)
 Pharmacy Quality Measure Review  This patient is appearing on a report for being at risk of failing the adherence measure for diabetes medications this calendar year.   Medication: GLIPIZIDE  5 MG Last fill date: 07/05/23 for 30 day supply  MyChart message sent to patient. and Contacted pharmacy to facilitate refills.

## 2023-10-08 ENCOUNTER — Telehealth: Payer: Self-pay

## 2023-10-08 NOTE — Telephone Encounter (Signed)
 Left message to return call to our office.    Patient assistance meds are available for pick up called and informed pt of this.      Medication: Novofine 32 G    QTY: 2 boxes  LOT [id number] Expired 02/11/2028     Medication: Tresiba   FlexTouch200units/ML Prefilled pen   QTY: 4 Box  LOT:  [id number] Expires 10/10/2025   1 bag have been labeled and placed in designated pick up area  We have limited space for medication please pick up at your earliest convenience.   Burnard FALCON, CMA

## 2023-10-12 NOTE — Addendum Note (Signed)
 Addended by: Latunya Kissick on: 10/12/2023 03:52 PM   Modules accepted: Orders

## 2023-10-14 NOTE — Progress Notes (Unsigned)
 Referral sent to [facility] -DM shoes / Success Lolita Schultze

## 2023-10-20 ENCOUNTER — Other Ambulatory Visit: Payer: Self-pay

## 2023-10-21 NOTE — Telephone Encounter (Signed)
 Pt came in and picked up his medications.

## 2023-11-02 ENCOUNTER — Other Ambulatory Visit (HOSPITAL_COMMUNITY): Payer: Self-pay

## 2023-11-08 ENCOUNTER — Ambulatory Visit (INDEPENDENT_AMBULATORY_CARE_PROVIDER_SITE_OTHER): Admitting: Family Medicine

## 2023-11-08 ENCOUNTER — Encounter: Payer: Self-pay | Admitting: Family Medicine

## 2023-11-08 VITALS — BP 128/74 | HR 101 | Temp 98.0°F | Ht 73.0 in | Wt 209.6 lb

## 2023-11-08 DIAGNOSIS — E1142 Type 2 diabetes mellitus with diabetic polyneuropathy: Secondary | ICD-10-CM

## 2023-11-08 DIAGNOSIS — E538 Deficiency of other specified B group vitamins: Secondary | ICD-10-CM | POA: Diagnosis not present

## 2023-11-08 DIAGNOSIS — Z7984 Long term (current) use of oral hypoglycemic drugs: Secondary | ICD-10-CM

## 2023-11-08 MED ORDER — CYANOCOBALAMIN 1000 MCG/ML IJ SOLN
INTRAMUSCULAR | Status: DC
Start: 2023-11-08 — End: 2023-11-14

## 2023-11-08 NOTE — Patient Instructions (Addendum)
 Go to the lab on the way out.   If you have mychart we'll likely use that to update you.    Take care.  Glad to see you. Plan on recheck in about 3-4 months at an office visit.  A1c at the visit.  You don't need to fast.

## 2023-11-08 NOTE — Progress Notes (Unsigned)
 Diabetes:  Using medications without difficulties: yes Hypoglycemic episodes:no Hyperglycemic episodes:no  Feet problems: see exam.   Blood Sugars averaging: usually 110s-150s, rarely higher eye exam within last year: yes Labs pending.    B12 def on replacement.  Labs pending.  He may need a refill on that, depending on his labs.    Ear wax.  L>R side.  Had been irrigating at home.   09/03/23.  Technically successful interlaminar epidural injection on the right at L3-4.  He had [facility] eval in the meantime.  He isn't scheduled for EMG and MRI in the near future.  Can that get moved sooner?  PMH and SH reviewed  Meds, vitals, and allergies reviewed.   ROS: Per HPI unless specifically indicated in ROS section   GEN: nad, alert and oriented HEENT: mucous membranes moist NECK: supple w/o LA CV: rrr. PULM: ctab, no inc wob ABD: soft, +bs EXT: no edema SKIN: no acute rash  Diabetic foot exam: Normal inspection except for R toe amputation.  No skin breakdown Minimal calluses intact DP pulses B Normal sensation to light touch and monofilament Nails thickened, L 1st nail remnant trimmed today.

## 2023-11-09 LAB — VITAMIN B12: Vitamin B-12: 108 pg/mL — ABNORMAL LOW (ref 211–911)

## 2023-11-09 LAB — HEMOGLOBIN A1C: Hgb A1c MFr Bld: 7.7 % — ABNORMAL HIGH (ref 4.6–6.5)

## 2023-11-10 ENCOUNTER — Ambulatory Visit: Payer: Self-pay | Admitting: Family Medicine

## 2023-11-11 NOTE — Assessment & Plan Note (Signed)
 See notes on labs.  Continue Trulicity  glipizide  and insulin .

## 2023-11-11 NOTE — Assessment & Plan Note (Signed)
No change in meds yet. See notes on labs.

## 2023-11-14 ENCOUNTER — Other Ambulatory Visit: Payer: Self-pay | Admitting: Family Medicine

## 2023-11-14 MED ORDER — CYANOCOBALAMIN 1000 MCG/ML IJ SOLN
INTRAMUSCULAR | Status: DC
Start: 2023-11-14 — End: 2023-11-19

## 2023-11-16 ENCOUNTER — Ambulatory Visit
Admission: EM | Admit: 2023-11-16 | Discharge: 2023-11-16 | Disposition: A | Discharge status: Home / Self Care | Attending: Family Medicine | Admitting: Family Medicine

## 2023-11-16 ENCOUNTER — Other Ambulatory Visit: Payer: Self-pay

## 2023-11-16 ENCOUNTER — Encounter: Payer: Self-pay | Admitting: Emergency Medicine

## 2023-11-16 DIAGNOSIS — H6123 Impacted cerumen, bilateral: Secondary | ICD-10-CM

## 2023-11-16 NOTE — ED Triage Notes (Signed)
 Pt here for left ear fullness; pt has wax in ears per pt; pt wears hearing aid in that ear normally

## 2023-11-16 NOTE — ED Provider Notes (Signed)
 [facility]   [id number] 11/16/23 Arrival Time: 1651  ASSESSMENT & PLAN:  1. Bilateral impacted cerumen    Removed via flushing ears. No signs of infection. Hearing much better. No further tx needed.   Follow-up Information     Cleatus Arlyss RAMAN, MD.   Specialty: Family Medicine Why: As needed. Contact information: [address], [address] [place] [address] [phone]                 Reviewed expectations re: course of current medical issues. Questions answered. Outlined signs and symptoms indicating need for more acute intervention. Understanding verbalized. After Visit Summary given.   SUBJECTIVE: History from: Patient. Christian [name] is a 83 y.o. male. Pt here for left ear fullness; pt has wax in ears per pt; pt wears hearing aid in that ear normally   OBJECTIVE:  Vitals:   11/16/23 1856  BP: 114/68  Pulse: 86  Resp: 18  Temp: 97.8 F (36.6 C)  TempSrc: Oral  SpO2: 95%    General appearance: alert; no distress Ears: bilateral cerumen impaction Psychological: alert and cooperative; normal mood and affect   Allergies  Allergen Reactions   Lipitor  [Atorvastatin ]     Max tolerated dose is 20mg  per day    Past Medical History:  Diagnosis Date   Arthritis    Blood transfusion without reported diagnosis    ? in 2017 after crushing injury    Cataract    right eye removed    Crushing injury of pelvic region    Diabetes mellitus without complication (HCC)    Hyperlipidemia    Hypertension    Social History   Socioeconomic History   Marital status: Married    Spouse name: Not on file   Number of children: Not on file   Years of education: Not on file   Highest education level: Not on file  Occupational History   Not on file  Tobacco Use   Smoking status: Never   Smokeless tobacco: Never  Substance and Sexual Activity   Alcohol use: No   Drug use: No   Sexual activity: Not Currently  Other Topics Concern   Not on  file  Social History Narrative   Married 2015   Retired, previous mission work   Research officer, trade union   Social Drivers of Corporate investment banker Strain: Low Risk  (07/05/2023)   Overall Financial Resource Strain (CARDIA)    Difficulty of Paying Living Expenses: Not hard at all  Food Insecurity: No Food Insecurity (07/05/2023)   Hunger Vital Sign    Worried About Running Out of Food in the Last Year: Never true    Ran Out of Food in the Last Year: Never true  Transportation Needs: No Transportation Needs (07/05/2023)   PRAPARE - Administrator, Civil Service (Medical): No    Lack of Transportation (Non-Medical): No  Physical Activity: Inactive (07/05/2023)   Exercise Vital Sign    Days of Exercise per Week: 0 days    Minutes of Exercise per Session: 0 min  Stress: No Stress Concern Present (07/05/2023)   [employer] of Occupational Health - Occupational Stress Questionnaire    Feeling of Stress : Not at all  Social Connections: Moderately Integrated (07/05/2023)   Social Connection and Isolation Panel    Frequency of Communication with Friends and Family: More than three times a week    Frequency of Social Gatherings with Friends and Family: More than three times  a week    Attends Religious Services: More than 4 times per year    Active Member of Clubs or Organizations: No    Attends Banker Meetings: Never    Marital Status: Married  Catering manager Violence: Not At Risk (07/05/2023)   Humiliation, Afraid, Rape, and Kick questionnaire    Fear of Current or Ex-Partner: No    Emotionally Abused: No    Physically Abused: No    Sexually Abused: No   Family History  Problem Relation Age of Onset   Lymphoma Mother    CVA Father    Dementia Father    Diabetes Neg Hx    Prostate cancer Neg Hx    Colon polyps Neg Hx    Colon cancer Neg Hx    Rectal cancer Neg Hx    Stomach cancer Neg Hx    Esophageal cancer Neg Hx    Past Surgical History:   Procedure Laterality Date   ACHILLES TENDON SURGERY Right    AMPUTATION Right 08/21/2020   Procedure: RIGHT FOOT 5TH RAY AMPUTATION;  Surgeon: Harden Jerona GAILS, MD;  Location: [facility] OR;  Service: Orthopedics;  Laterality: Right;   APPLICATION OF A-CELL OF BACK N/A 02/10/2016   Procedure: APPLICATION OF A-CELL OF sacral wound;  Surgeon: Estefana RAMAN Dillingham, DO;  Location: [facility] OR;  Service: Plastics;  Laterality: N/A;   APPLICATION OF WOUND VAC N/A 02/10/2016   Procedure: APPLICATION OF WOUND VAC possible;  Surgeon: Estefana RAMAN Dillingham, DO;  Location: [facility] OR;  Service: Plastics;  Laterality: N/A;   CATARACT EXTRACTION Left 05/28/2021   COLONOSCOPY     FOOT SURGERY  06/14/2023   Had floater bone removed   I & D EXTREMITY N/A 01/30/2016   Procedure: IRRIGATION AND DEBRIDEMENT OF THE SACRUM;  Surgeon: Estefana RAMAN Dillingham, DO;  Location: [facility] OR;  Service: Plastics;  Laterality: N/A;   INCISION AND DRAINAGE OF WOUND N/A 02/10/2016   Procedure: IRRIGATION AND DEBRIDEMENT sacral WOUND;  Surgeon: Estefana RAMAN Dillingham, DO;  Location: [facility] OR;  Service: Plastics;  Laterality: N/A;   IR GENERIC HISTORICAL  02/11/2016   IR CATHETER TUBE CHANGE 02/11/2016 Marcey Moan, MD [facility]-INTER[facility] RAD   IR GENERIC HISTORICAL  03/16/2016   IR CATHETER TUBE CHANGE 03/16/2016 Norleen Roulette, MD [facility]-INTER[facility] RAD   IR GENERIC HISTORICAL  05/11/2016   IR CATHETER TUBE CHANGE 05/11/2016 Marcey Moan, MD [facility]-INTER[facility] RAD   POLYPECTOMY     SACRO-ILIAC PINNING N/A 01/16/2016   Procedure: MARNETTE ROTA;  Surgeon: Ozell Bruch, MD;  Location: [facility] OR;  Service: Orthopedics;  Laterality: N/A;   SUPRAPUBIC CATHETER INSERTION     s/p removal 2018   TONSILLECTOMY     ULNAR NERVE TRANSPOSITION Right      Rolinda Rogue, MD 11/16/23 [phone]

## 2023-11-19 ENCOUNTER — Other Ambulatory Visit: Payer: Self-pay

## 2023-11-19 MED ORDER — CYANOCOBALAMIN 1000 MCG/ML IJ SOLN
INTRAMUSCULAR | 3 refills | Status: DC
Start: 2023-11-19 — End: 2023-11-19

## 2023-11-19 MED ORDER — CYANOCOBALAMIN 1000 MCG/ML IJ SOLN: INTRAMUSCULAR | 3 refills | Status: DC

## 2023-12-03 ENCOUNTER — Other Ambulatory Visit (HOSPITAL_COMMUNITY): Payer: Self-pay

## 2023-12-03 ENCOUNTER — Other Ambulatory Visit: Payer: Self-pay

## 2023-12-14 ENCOUNTER — Other Ambulatory Visit (HOSPITAL_COMMUNITY): Payer: Self-pay

## 2023-12-20 ENCOUNTER — Other Ambulatory Visit: Payer: Self-pay | Admitting: Family Medicine

## 2023-12-20 ENCOUNTER — Other Ambulatory Visit: Payer: Self-pay

## 2023-12-20 ENCOUNTER — Other Ambulatory Visit (HOSPITAL_COMMUNITY): Payer: Self-pay

## 2023-12-20 DIAGNOSIS — E1142 Type 2 diabetes mellitus with diabetic polyneuropathy: Secondary | ICD-10-CM

## 2023-12-20 MED ORDER — ACCU-CHEK FASTCLIX LANCETS MISC
2 refills | Status: AC
  Filled 2023-12-20: qty 102, 90d supply, fill #0
  Filled 2024-05-11: qty 102, 90d supply, fill #1

## 2023-12-20 MED ORDER — ACCU-CHEK GUIDE W/DEVICE KIT
1.0000 | PACK | Freq: Every day | 0 refills | Status: AC
  Filled 2023-12-20: qty 1, 30d supply, fill #0

## 2023-12-20 MED ORDER — ACCU-CHEK GUIDE TEST VI STRP
ORAL_STRIP | 12 refills | Status: AC
  Filled 2023-12-20: qty 100, 90d supply, fill #0
  Filled 2024-04-05: qty 100, 90d supply, fill #1

## 2023-12-30 ENCOUNTER — Other Ambulatory Visit: Payer: Self-pay | Admitting: Orthopedic Surgery

## 2023-12-30 DIAGNOSIS — M4807 Spinal stenosis, lumbosacral region: Secondary | ICD-10-CM

## 2024-01-05 ENCOUNTER — Other Ambulatory Visit (HOSPITAL_COMMUNITY): Payer: Self-pay

## 2024-01-06 ENCOUNTER — Other Ambulatory Visit: Payer: Self-pay

## 2024-01-10 NOTE — Discharge Instructions (Signed)

## 2024-01-11 ENCOUNTER — Ambulatory Visit
Admission: RE | Admit: 2024-01-11 | Discharge: 2024-01-11 | Disposition: A | Discharge status: Home / Self Care | Source: Ambulatory Visit | Attending: Orthopedic Surgery | Admitting: Orthopedic Surgery

## 2024-01-11 DIAGNOSIS — M4807 Spinal stenosis, lumbosacral region: Secondary | ICD-10-CM

## 2024-01-11 DIAGNOSIS — M545 Low back pain, unspecified: Secondary | ICD-10-CM | POA: Diagnosis not present

## 2024-01-11 DIAGNOSIS — G8929 Other chronic pain: Secondary | ICD-10-CM | POA: Diagnosis not present

## 2024-01-11 MED ORDER — IOPAMIDOL (ISOVUE-M 200) INJECTION 41%
1.0000 mL | Freq: Once | INTRAMUSCULAR | Status: AC
  Administered 2024-01-11: 1 mL via EPIDURAL

## 2024-01-11 MED ORDER — METHYLPREDNISOLONE ACETATE 40 MG/ML INJ SUSP (RADIOLOG
80.0000 mg | Freq: Once | INTRAMUSCULAR | Status: AC
  Administered 2024-01-11: 80 mg via EPIDURAL

## 2024-01-14 ENCOUNTER — Other Ambulatory Visit (HOSPITAL_COMMUNITY): Payer: Self-pay

## 2024-01-14 ENCOUNTER — Telehealth: Payer: Self-pay

## 2024-01-14 NOTE — Telephone Encounter (Signed)
 Called patient and left voicemail to advise that he has 2 boxes of the novofine pens available for pickup. Will be at the front desk in the cabinet

## 2024-01-17 ENCOUNTER — Other Ambulatory Visit (HOSPITAL_COMMUNITY): Payer: Self-pay

## 2024-01-17 ENCOUNTER — Telehealth: Payer: Self-pay

## 2024-01-17 NOTE — Telephone Encounter (Signed)
 Spoke with patient to advise that he has 4 boxes of the Tresiba  available for pickup

## 2024-01-17 NOTE — Telephone Encounter (Signed)
 Patient arrived in office today to pick up

## 2024-01-20 ENCOUNTER — Other Ambulatory Visit: Payer: Self-pay | Admitting: Family Medicine

## 2024-01-24 ENCOUNTER — Encounter: Payer: Self-pay | Admitting: Pharmacist

## 2024-01-24 NOTE — Progress Notes (Signed)
 Pharmacy Quality Measure Review  This patient is appearing on a report for being at risk of failing the adherence measure for diabetes medications this calendar year.   Medication: glipizide  Last fill date: 12/14/23 for 30 day supply  Insurance report was not up to date. No action needed at this time.  Medication has been refilled as of 01/17/24 x30 ds.  Next refill due 02/16/24

## 2024-02-03 ENCOUNTER — Other Ambulatory Visit: Payer: Self-pay | Admitting: Family Medicine

## 2024-02-03 NOTE — Telephone Encounter (Signed)
 Trulicity  Last filled:  12/20/23, #6 mL Last OV:  11/08/23, 3 mo DM f/u

## 2024-02-04 DIAGNOSIS — L538 Other specified erythematous conditions: Secondary | ICD-10-CM | POA: Diagnosis not present

## 2024-02-04 DIAGNOSIS — L570 Actinic keratosis: Secondary | ICD-10-CM | POA: Diagnosis not present

## 2024-02-04 DIAGNOSIS — D225 Melanocytic nevi of trunk: Secondary | ICD-10-CM | POA: Diagnosis not present

## 2024-02-04 DIAGNOSIS — L821 Other seborrheic keratosis: Secondary | ICD-10-CM | POA: Diagnosis not present

## 2024-02-04 DIAGNOSIS — L820 Inflamed seborrheic keratosis: Secondary | ICD-10-CM | POA: Diagnosis not present

## 2024-02-04 DIAGNOSIS — L814 Other melanin hyperpigmentation: Secondary | ICD-10-CM | POA: Diagnosis not present

## 2024-02-08 ENCOUNTER — Encounter: Payer: Self-pay | Admitting: Family Medicine

## 2024-02-08 ENCOUNTER — Ambulatory Visit (INDEPENDENT_AMBULATORY_CARE_PROVIDER_SITE_OTHER): Admitting: Family Medicine

## 2024-02-08 ENCOUNTER — Other Ambulatory Visit (HOSPITAL_COMMUNITY): Payer: Self-pay

## 2024-02-08 ENCOUNTER — Other Ambulatory Visit: Payer: Self-pay

## 2024-02-08 VITALS — BP 122/70 | HR 86 | Temp 98.2°F | Ht 73.0 in | Wt 213.5 lb

## 2024-02-08 DIAGNOSIS — I10 Essential (primary) hypertension: Secondary | ICD-10-CM

## 2024-02-08 DIAGNOSIS — E1129 Type 2 diabetes mellitus with other diabetic kidney complication: Secondary | ICD-10-CM

## 2024-02-08 DIAGNOSIS — R809 Proteinuria, unspecified: Secondary | ICD-10-CM | POA: Diagnosis not present

## 2024-02-08 DIAGNOSIS — E1142 Type 2 diabetes mellitus with diabetic polyneuropathy: Secondary | ICD-10-CM | POA: Diagnosis not present

## 2024-02-08 DIAGNOSIS — E785 Hyperlipidemia, unspecified: Secondary | ICD-10-CM | POA: Diagnosis not present

## 2024-02-08 DIAGNOSIS — Z23 Encounter for immunization: Secondary | ICD-10-CM | POA: Diagnosis not present

## 2024-02-08 DIAGNOSIS — E538 Deficiency of other specified B group vitamins: Secondary | ICD-10-CM

## 2024-02-08 LAB — POCT GLYCOSYLATED HEMOGLOBIN (HGB A1C): Hemoglobin A1C: 7 % — AB (ref 4.0–5.6)

## 2024-02-08 MED ORDER — AMLODIPINE BESYLATE 5 MG PO TABS
5.0000 mg | ORAL_TABLET | Freq: Every day | ORAL | 2 refills | Status: AC
  Filled 2024-02-08: qty 90, 90d supply, fill #0
  Filled 2024-05-11: qty 90, 90d supply, fill #1

## 2024-02-08 MED ORDER — CYANOCOBALAMIN 1000 MCG/ML IJ SOLN
1000.0000 ug | INTRAMUSCULAR | 3 refills | Status: AC
Start: 2024-02-08 — End: ?

## 2024-02-08 MED ORDER — HYDROCHLOROTHIAZIDE 12.5 MG PO TABS
12.5000 mg | ORAL_TABLET | Freq: Every day | ORAL | 2 refills | Status: AC
  Filled 2024-02-08: qty 90, 90d supply, fill #0
  Filled 2024-05-11: qty 90, 90d supply, fill #1

## 2024-02-08 MED ORDER — COMFORT EZ PEN NEEDLES 31G X 8 MM MISC
1.0000 | Freq: Every day | 3 refills | Status: AC
  Filled 2024-02-08: qty 100, 100d supply, fill #0

## 2024-02-08 MED ORDER — TRESIBA FLEXTOUCH 200 UNIT/ML ~~LOC~~ SOPN
40.0000 [IU] | PEN_INJECTOR | Freq: Every day | SUBCUTANEOUS | Status: AC
Start: 2024-02-08 — End: ?

## 2024-02-08 MED ORDER — LISINOPRIL 30 MG PO TABS
30.0000 mg | ORAL_TABLET | Freq: Every evening | ORAL | 3 refills | Status: AC
  Filled 2024-02-08: qty 90, 90d supply, fill #0
  Filled 2024-05-11: qty 90, 90d supply, fill #1

## 2024-02-08 MED ORDER — ATORVASTATIN CALCIUM 20 MG PO TABS
20.0000 mg | ORAL_TABLET | Freq: Every evening | ORAL | 3 refills | Status: AC
  Filled 2024-02-08: qty 90, 90d supply, fill #0
  Filled 2024-05-11: qty 90, 90d supply, fill #1

## 2024-02-08 MED ORDER — GLIPIZIDE 5 MG PO TABS
ORAL_TABLET | ORAL | 2 refills | Status: AC
  Filled 2024-02-08 – 2024-02-21 (×3): qty 270, 90d supply, fill #0
  Filled 2024-05-11: qty 270, 90d supply, fill #1

## 2024-02-08 MED ORDER — SYRINGE/NEEDLE (DISP) 25G X 1" 3 ML MISC: 1 refills | Status: AC

## 2024-02-08 NOTE — Patient Instructions (Signed)
 Recheck in about 4 months, sooner if needed.  A1c at visit.   Update me as needed.  Take care.  Glad to see you.

## 2024-02-08 NOTE — Progress Notes (Unsigned)
 Diabetes:  Using medications without difficulties: yes Hypoglycemic episodes: only if skipping breakfast, cautions d/w pt.   Hyperglycemic episodes: no Feet problems: some tingling in the R foot.   Blood Sugars averaging: 100-130 eye exam within last year: yes A1c 7.  D/w pt at OV.   Flu shot today.   BP controlled on home checks.    Prev B12 low.  He has been dosing at home.  Last dose was about 2 weeks ago.  He is on monthly shots.     He had injection per outside clinic and L leg numbness improved.  He has altered sensation w/o pain in the R leg now.  He is using a cane.  I asked him to check with the spine clinic.    Meds, vitals, and allergies reviewed.   ROS: Per HPI unless specifically indicated in ROS section   GEN: nad, alert and oriented HEENT: mucous membranes moist NECK: supple w/o LA CV: rrr. PULM: ctab, no inc wob ABD: soft, +bs EXT: no edema SKIN: well perfused.   Diabetic foot exam: Normal inspection except for 1cm irritated area on the dorsum R foot at distal 1st MT- area had been rubbed and abraded, covered with bandaid.  Doesn't appear infected.  Routine cautions given to patient. No skin breakdown No calluses  Normal DP pulses Normal sensation to light touch but mild dec to monofilament L 1st nail absent

## 2024-02-09 ENCOUNTER — Other Ambulatory Visit: Payer: Self-pay

## 2024-02-09 NOTE — Assessment & Plan Note (Signed)
 History of, has restarted replacement.  We can recheck a B12 level later on.

## 2024-02-09 NOTE — Assessment & Plan Note (Signed)
 Footcare discussed with patient. A1c 7.  D/w pt at OV.  Continue Trulicity  glipizide  Jardiance  and insulin .

## 2024-02-11 ENCOUNTER — Encounter: Payer: Self-pay | Admitting: Pharmacist

## 2024-02-11 NOTE — Progress Notes (Signed)
 Pharmacy Quality Measure Review  This patient is appearing on a report for being at risk of failing the adherence measure for hypertension (ACEi/ARB) medications this calendar year.   Medication: lisinopril  30 mg Last fill date: 01/05/24 for 30 day supply  Insurance report was not up to date. No action needed at this time.  Medication has been refilled as of 10/28 x90 ds

## 2024-02-14 ENCOUNTER — Other Ambulatory Visit: Payer: Self-pay

## 2024-02-14 ENCOUNTER — Other Ambulatory Visit (HOSPITAL_COMMUNITY): Payer: Self-pay

## 2024-02-14 NOTE — Progress Notes (Signed)
  Date: 02/14/2024 Patient: Christian [name] Ht:  Wt:  ADJ:Uyzmz is no height or weight on file to calculate BSA.  Referring Provider/CC to: Danisa, Olumide Ayodele, MD [address] [place],  [place] [address]  Chief Complaint:  Chief Complaint  Patient presents with  . Lower Back - Pain    EMG - left leg    [name] is a 83 y.o. male who presents today to the [facility] for electrodiagnostic evaluation.  History of Present Illness: 83 YR OLD MALE WITH COMPLAINTS OF LOWER BACK PAIN AND ASSOCIATED RIGHT LOWER EXTREMITY RADICULAR SYMPTOMS INCLUDING NUMBNESS AND TINGLING.    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

## 2024-02-17 ENCOUNTER — Telehealth: Payer: Self-pay

## 2024-02-17 NOTE — Telephone Encounter (Signed)
 Received attached fax.

## 2024-02-18 NOTE — Telephone Encounter (Signed)
 Our team is currently processing renewals for 2026 and will reach out to him shortly. Thank you!

## 2024-02-21 ENCOUNTER — Other Ambulatory Visit: Payer: Self-pay

## 2024-02-21 ENCOUNTER — Other Ambulatory Visit (HOSPITAL_COMMUNITY): Payer: Self-pay

## 2024-02-22 ENCOUNTER — Other Ambulatory Visit (HOSPITAL_COMMUNITY): Payer: Self-pay

## 2024-02-28 ENCOUNTER — Other Ambulatory Visit: Payer: Self-pay | Admitting: Orthopedic Surgery

## 2024-02-28 DIAGNOSIS — M4807 Spinal stenosis, lumbosacral region: Secondary | ICD-10-CM

## 2024-03-06 ENCOUNTER — Telehealth: Payer: Self-pay

## 2024-03-06 NOTE — Telephone Encounter (Signed)
 Renewal of PAP for Trulicity  by [employer] for 2026. Mailed pt portion and fax provider portion

## 2024-03-06 NOTE — Telephone Encounter (Signed)
 Mail out pt portion per pt request and faxed provider portion,Novo Nordisk (Tresiba ) done online faxed provider portio

## 2024-03-07 NOTE — Telephone Encounter (Signed)
Form done. Thanks. 

## 2024-03-07 NOTE — Telephone Encounter (Signed)
 Placed form, to be signed, in Dr Elfredia in box on desk.

## 2024-03-08 NOTE — Telephone Encounter (Signed)
 Placed forms in Lindsay's box at front office.

## 2024-03-10 NOTE — Telephone Encounter (Signed)
 Received Novo Nordisk (Tresiba )pap back from provider office, faxed to [employer] today.

## 2024-03-10 NOTE — Telephone Encounter (Signed)
 Received provider portion pap [employer] (Trulicity ) waiting on pt portion.

## 2024-03-13 LAB — OPHTHALMOLOGY REPORT-SCANNED

## 2024-03-14 ENCOUNTER — Telehealth: Payer: Self-pay | Admitting: Family Medicine

## 2024-03-14 ENCOUNTER — Ambulatory Visit
Admission: RE | Admit: 2024-03-14 | Discharge: 2024-03-14 | Disposition: A | Source: Ambulatory Visit | Attending: Orthopedic Surgery | Admitting: Orthopedic Surgery

## 2024-03-14 DIAGNOSIS — M4807 Spinal stenosis, lumbosacral region: Secondary | ICD-10-CM

## 2024-03-14 NOTE — Telephone Encounter (Signed)
 Copied from CRM #[id number]. Topic: General - Other >> Mar 14, 2024  9:04 AM Joesph NOVAK wrote: Reason for CRM: Shakira from [facility] would like to speak to someone in regards to the renewal for PAP.   PH: [phone].

## 2024-03-14 NOTE — Telephone Encounter (Signed)
 Pt call provider office requesting assistance on pap ,left a HIPAA VM.

## 2024-03-15 NOTE — Telephone Encounter (Signed)
 Novo Nordisk pap done online for pt submitted today

## 2024-04-05 ENCOUNTER — Other Ambulatory Visit (HOSPITAL_COMMUNITY): Payer: Self-pay

## 2024-05-11 ENCOUNTER — Other Ambulatory Visit (HOSPITAL_COMMUNITY): Payer: Self-pay

## 2024-06-09 ENCOUNTER — Ambulatory Visit: Admitting: Family Medicine

## 2024-07-10 ENCOUNTER — Ambulatory Visit
